# Patient Record
Sex: Male | Born: 1940 | Race: Black or African American | Hispanic: No | Marital: Married | Smoking: Former smoker
Health system: Southern US, Community
[De-identification: ages and names within clinical notes are randomized; demographics above are authoritative.]

## PROBLEM LIST (undated history)

## (undated) DIAGNOSIS — M109 Gout, unspecified: Secondary | ICD-10-CM

## (undated) DIAGNOSIS — I10 Essential (primary) hypertension: Secondary | ICD-10-CM

## (undated) DIAGNOSIS — I482 Chronic atrial fibrillation, unspecified: Secondary | ICD-10-CM

## (undated) DIAGNOSIS — I5032 Chronic diastolic (congestive) heart failure: Secondary | ICD-10-CM

---

## 2004-10-25 ENCOUNTER — Emergency Department: Payer: Self-pay | Admitting: General Practice

## 2006-01-16 ENCOUNTER — Inpatient Hospital Stay: Payer: Self-pay | Admitting: Internal Medicine

## 2006-01-16 ENCOUNTER — Other Ambulatory Visit: Payer: Self-pay

## 2006-01-16 IMAGING — CR DG CHEST 2V
1 series · 2 of 2 positions shown · non-contrast
Comparison: none

REASON FOR EXAM: Weakness on right side
COMMENTS:

[Series 1: view not recorded · 0.17mm/px · 2 of 2 slices shown]
[im 1/2]
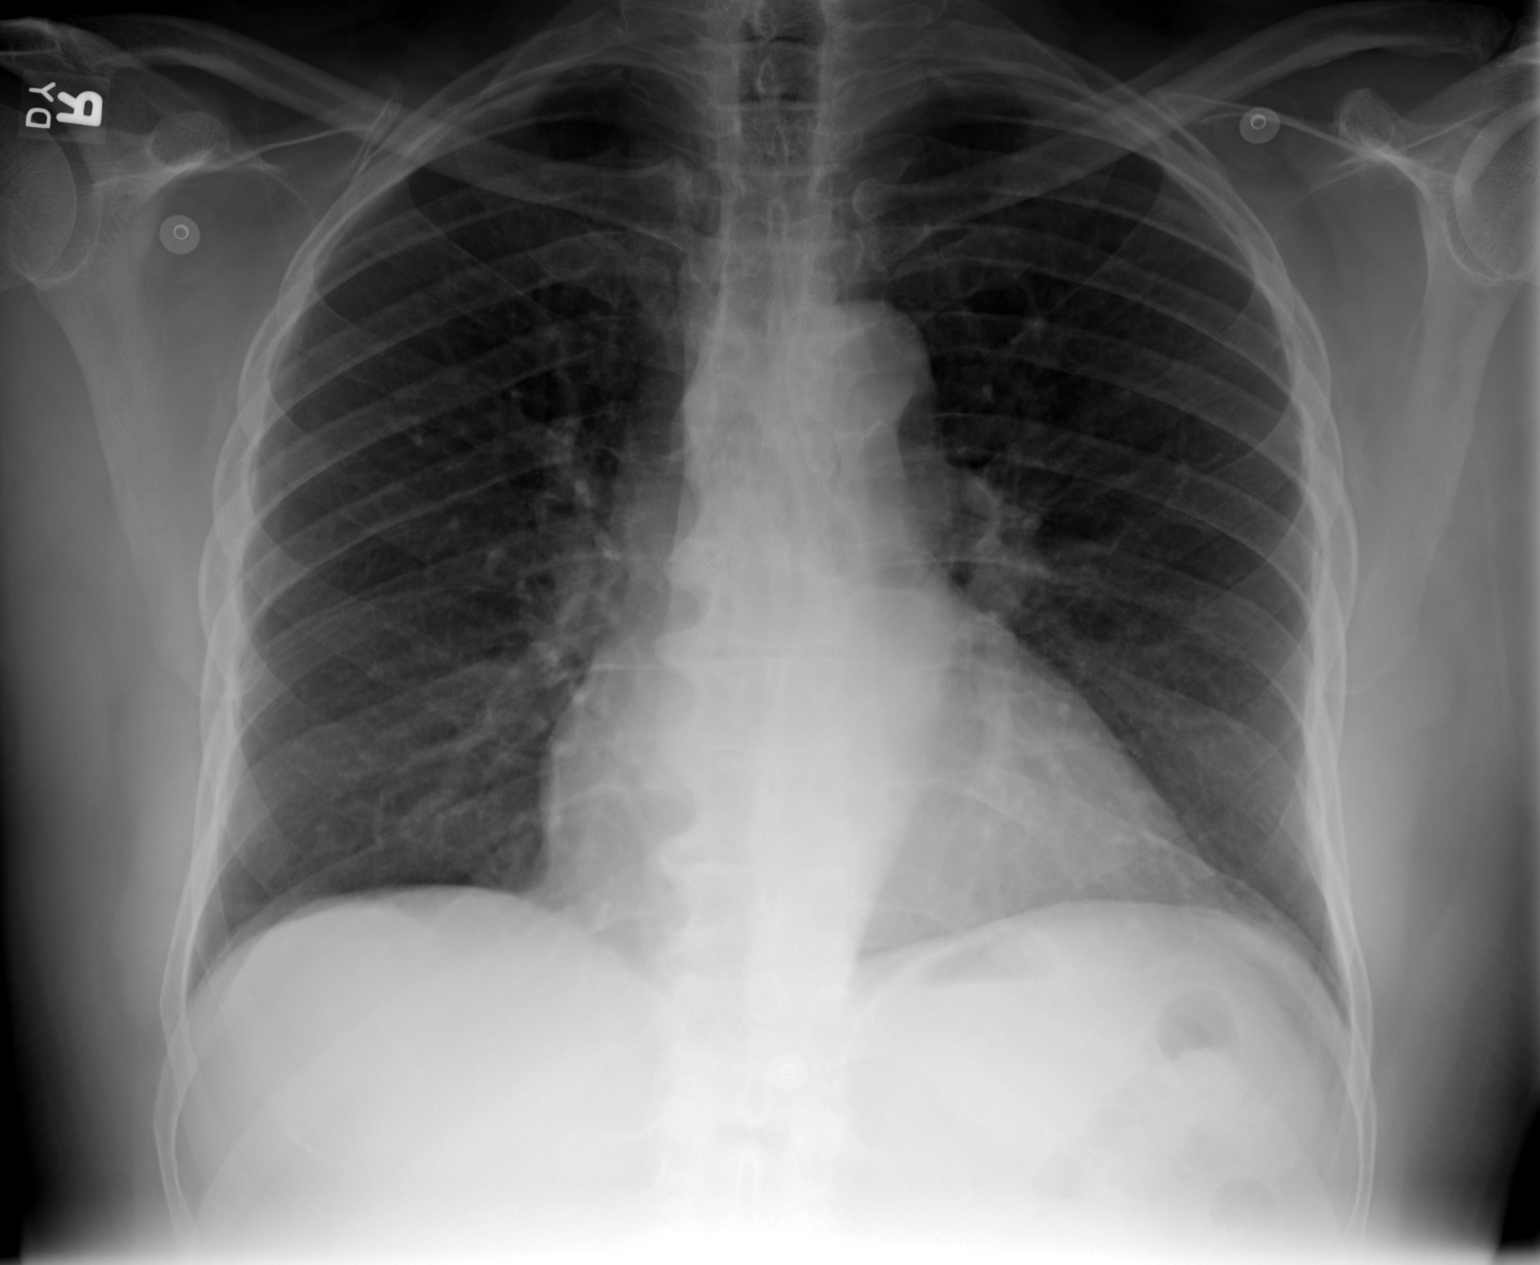
[im 2/2]
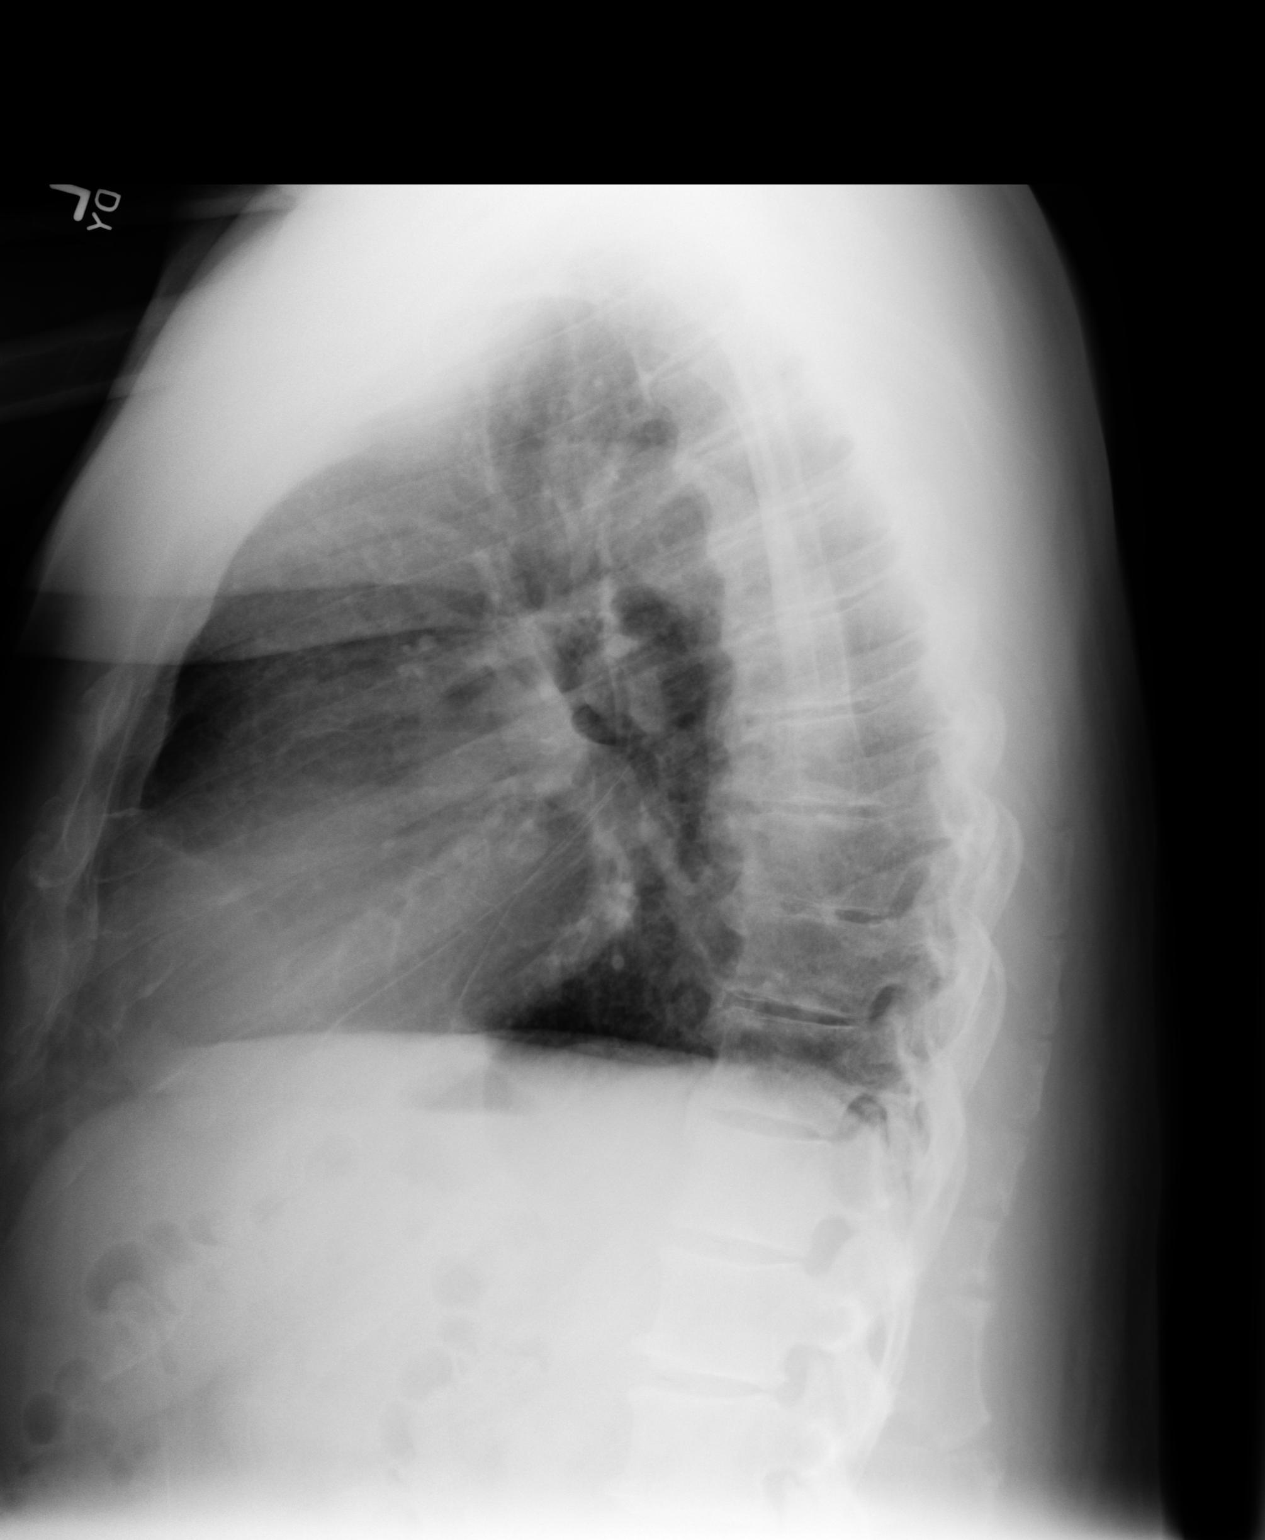

[2 of 2 positions shown; findings below may reference images not displayed]

PROCEDURE:     DXR - DXR CHEST PA (OR AP) AND LATERAL  - [DATE]  [DATE]

RESULT:          The cardiac silhouette is moderately enlarged.  There is no
evidence of focal infiltrates.  There does appear to be an element of mild
flattening of the hemidiaphragms.  The visualized bony skeleton is
unremarkable.
IMPRESSION: 1.     Findings which appear to be consistent with an element of COPD.
2.     Cardiomegaly.
3.     No evidence of focal or acute cardiopulmonary disease.

## 2006-01-16 IMAGING — CT CT HEAD WITHOUT CONTRAST
2 series · 16 of 30 positions shown, 20 images · non-contrast
Comparison: none

REASON FOR EXAM: Weakness on right side
COMMENTS:

[Series 2: without · axial · non-contrast · 0.43mm/px · z∈[-192,-68]mm · 13 of 31 slices shown, 17 images]
[im 3/31  brain]
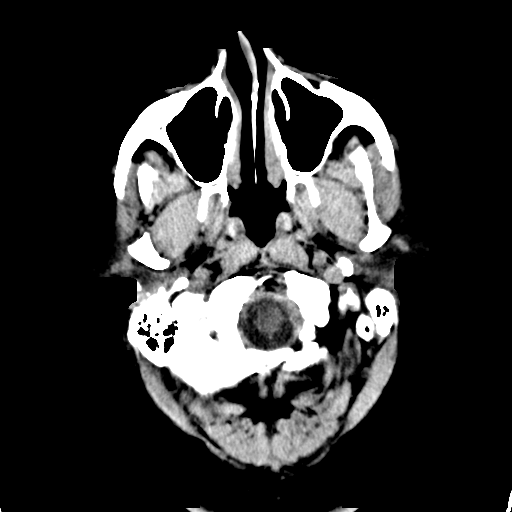
[im 3/31  bone]
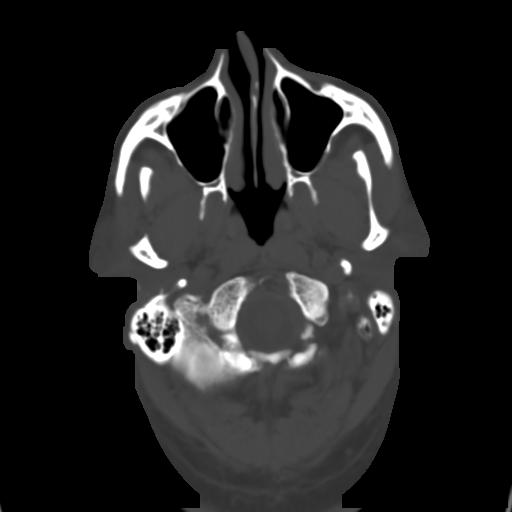
[im 5/31  brain]
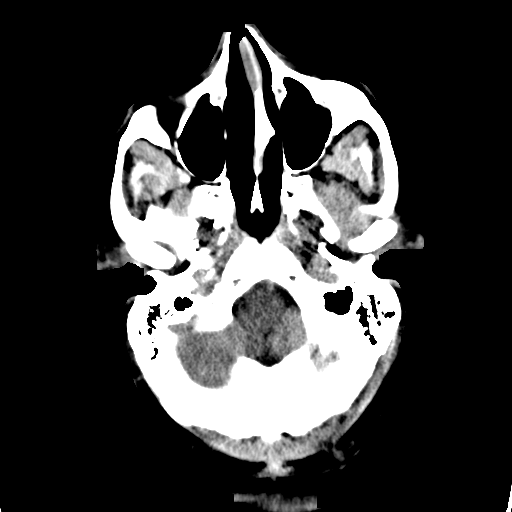
[im 7/31  brain]
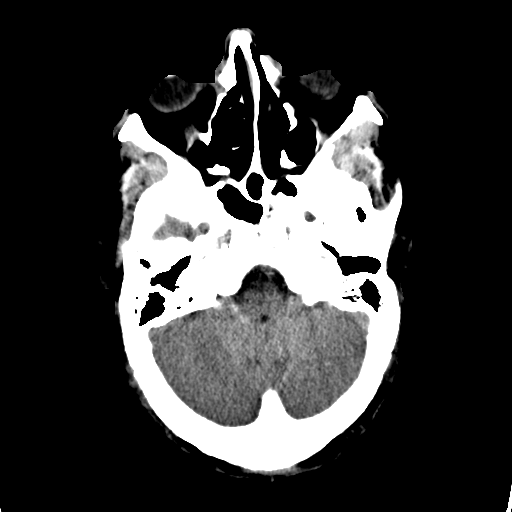
[im 9/31  brain]
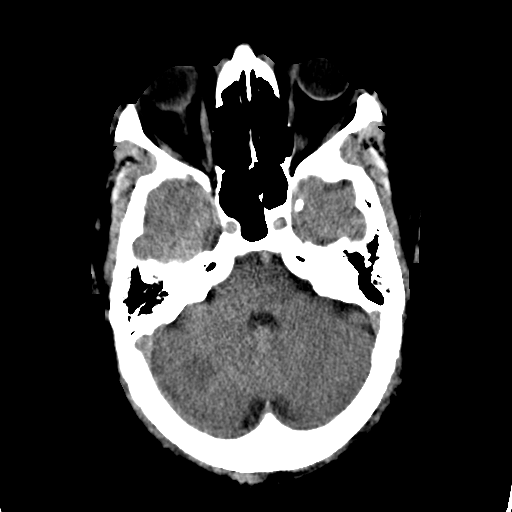
[im 11/31  brain]
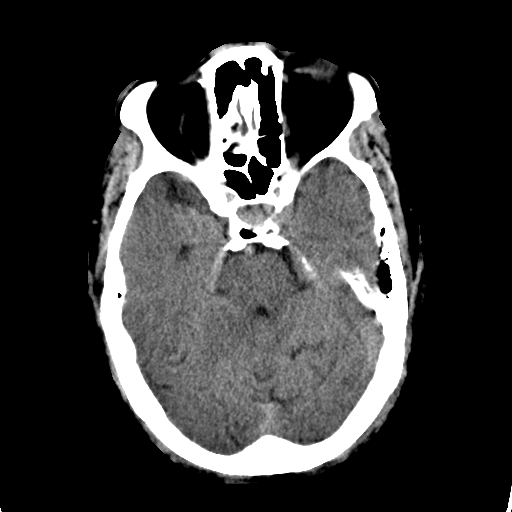
[im 11/31  bone]
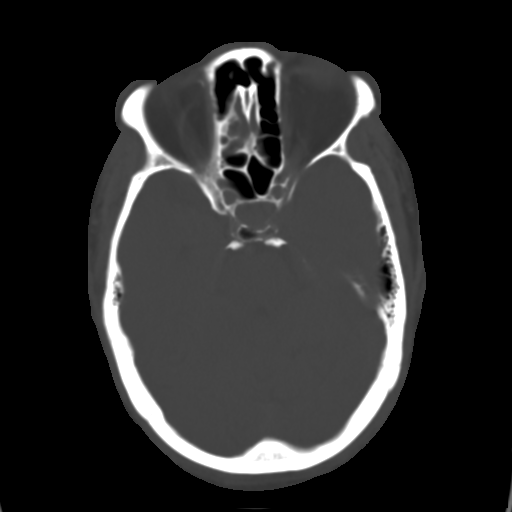
[im 13/31  brain]
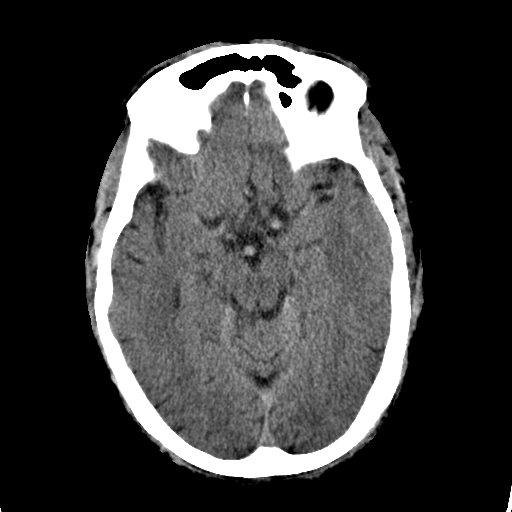
[im 16/31  brain]
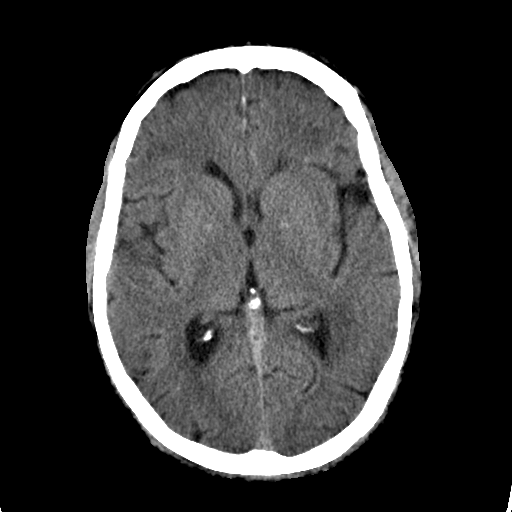
[im 18/31  brain]
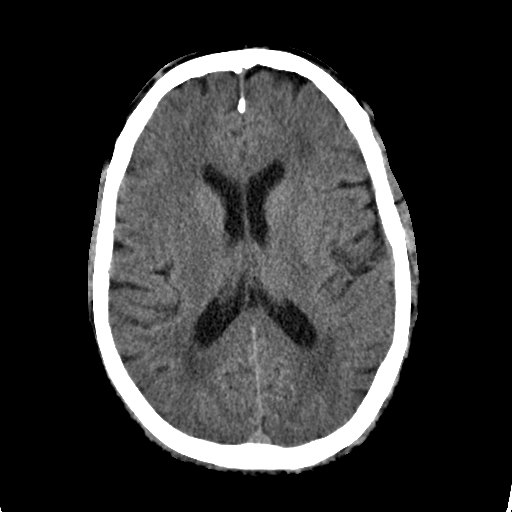
[im 20/31  brain]
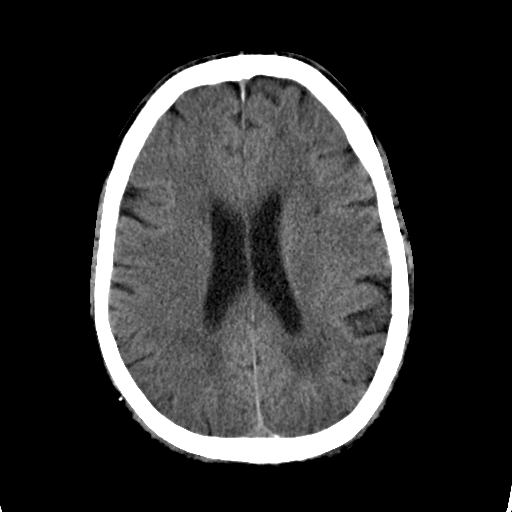
[im 20/31  bone]
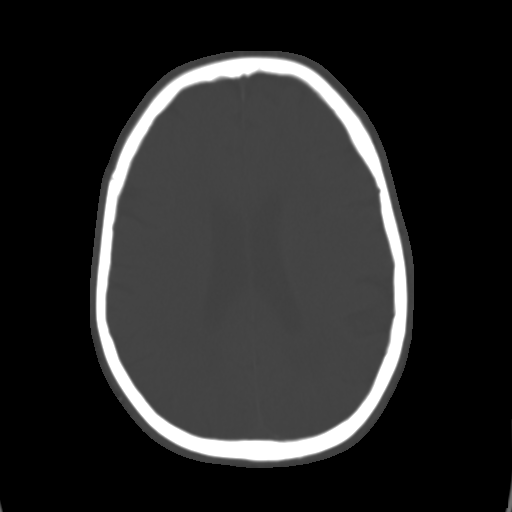
[im 22/31  brain]
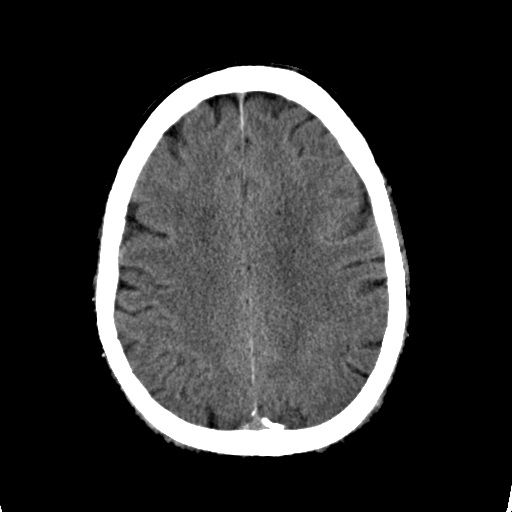
[im 24/31  brain]
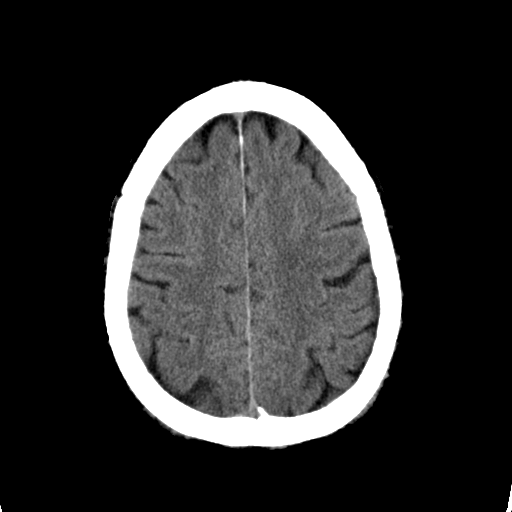
[im 26/31  brain]
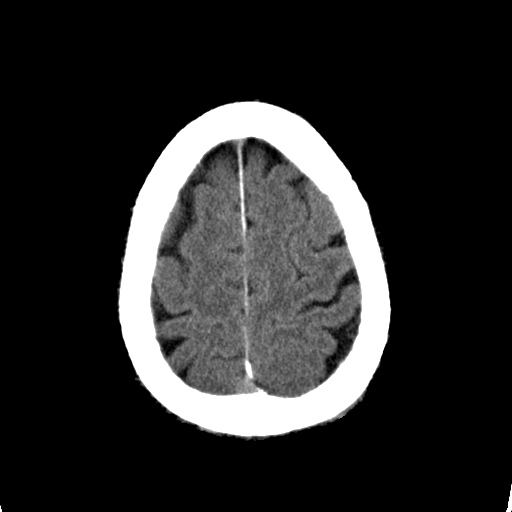
[im 28/31  brain]
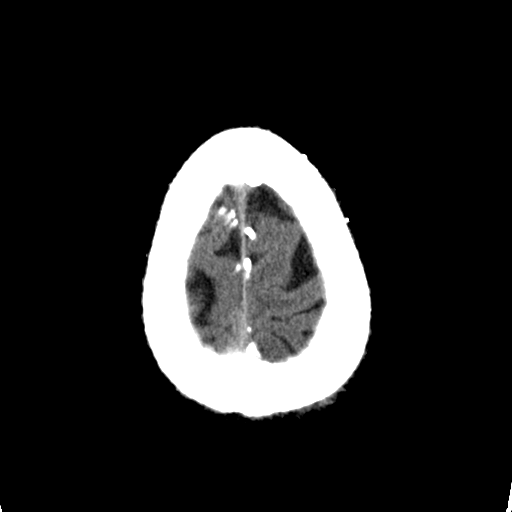
[im 28/31  bone]
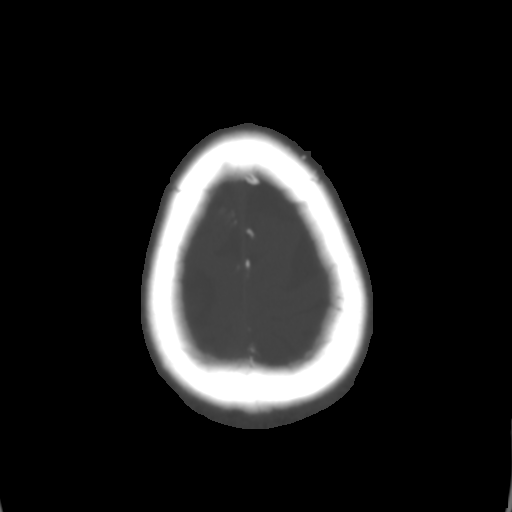

[Series 3: bone · axial · 0.43mm/px · z∈[-192,-152]mm · 3 of 31 slices shown]
[im 3/31  bone]
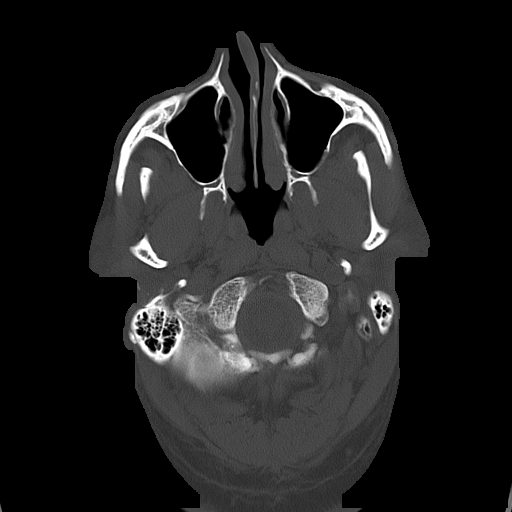
[im 7/31  bone]
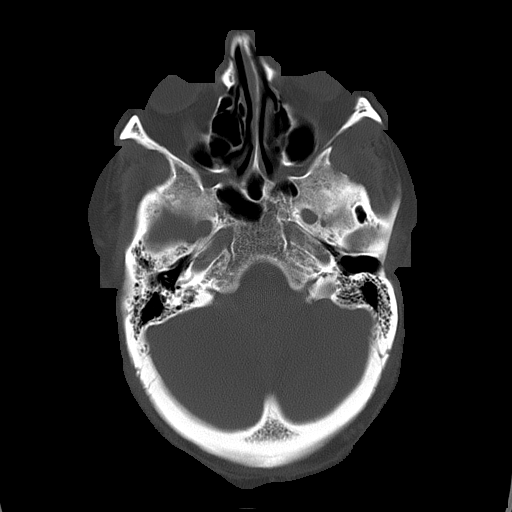
[im 11/31  bone]
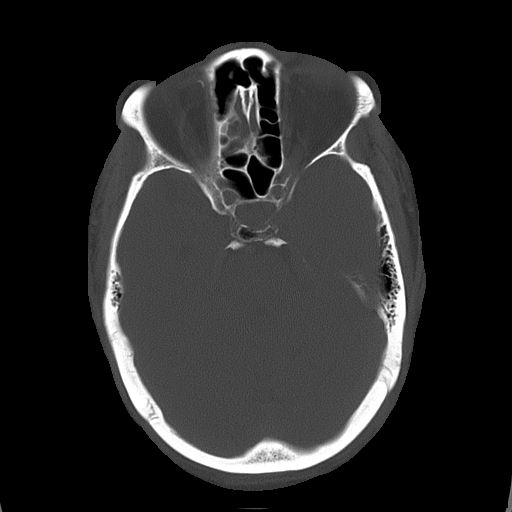

[16 of 30 positions shown; findings below may reference images not displayed]

PROCEDURE:     CT  - CT HEAD WITHOUT CONTRAST  - [DATE]  [DATE]

RESULT:          There is no evidence of intraaxial nor extraaxial fluid
collections nor evidence of acute hemorrhage.  No secondary signs are
appreciated to suggest mass effect nor focal subacute or chronic infarction.
 Age-related changes are evident demonstrated by diffuse cerebral atrophy
and periventricular small vessel white matter ischemia.  The visualized bony
skeleton demonstrates no evidence of fracture or dislocation.
IMPRESSION: Age-related and chronic changes without evidence of
acute abnormalities nor focal abnormalities.

## 2006-01-17 ENCOUNTER — Other Ambulatory Visit: Payer: Self-pay

## 2006-01-18 IMAGING — US US CAROTID DUPLEX BILAT
1 series · 17 of 24 positions shown · non-contrast
Comparison: none

REASON FOR EXAM: TIA
COMMENTS:

[Series 1: us carotid duplex bilat · 17 of 53 slices shown]
[im 1/53]
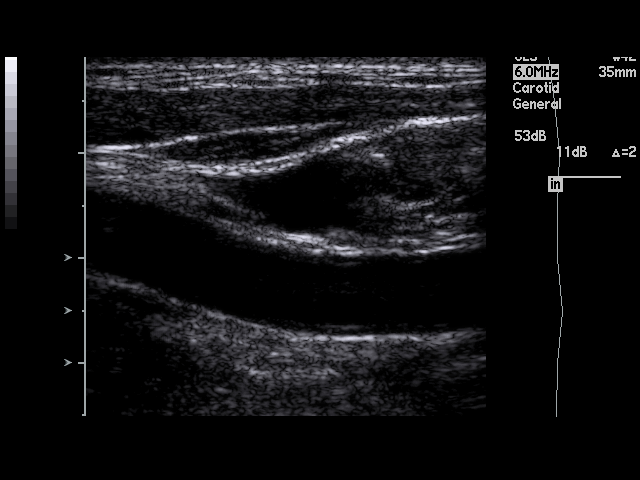
[im 5/53]
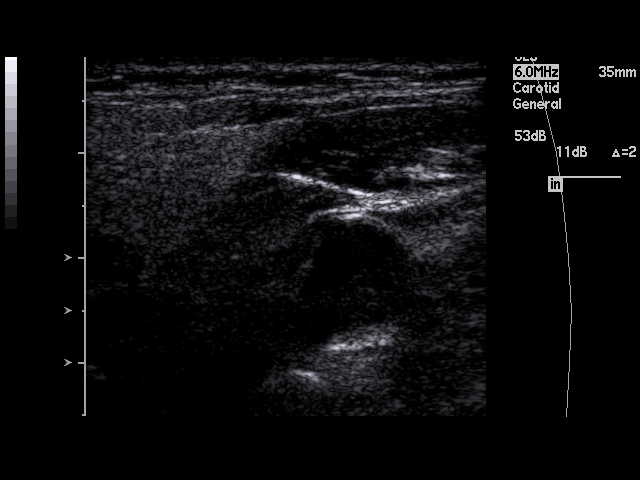
[im 7/53]
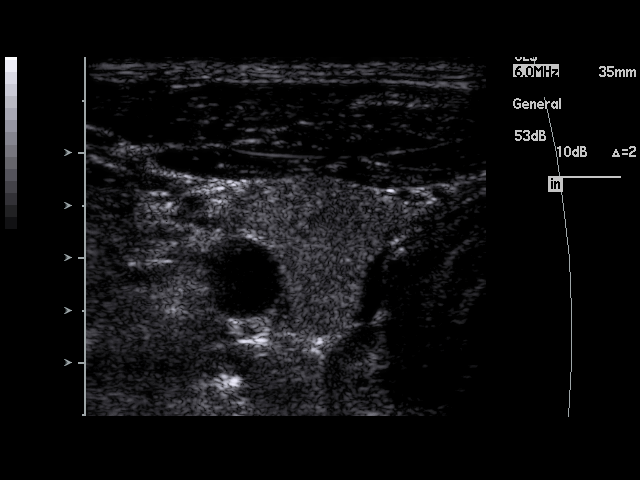
[im 10/53]
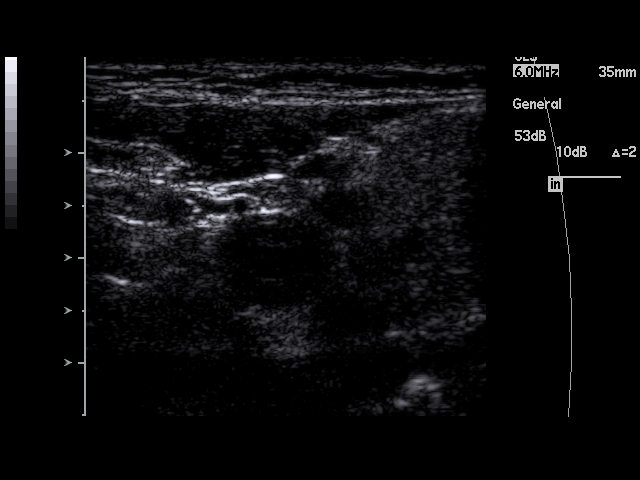
[im 14/53]
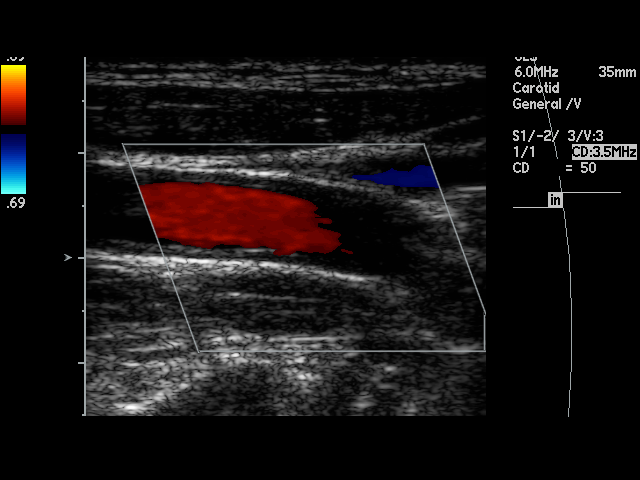
[im 16/53]
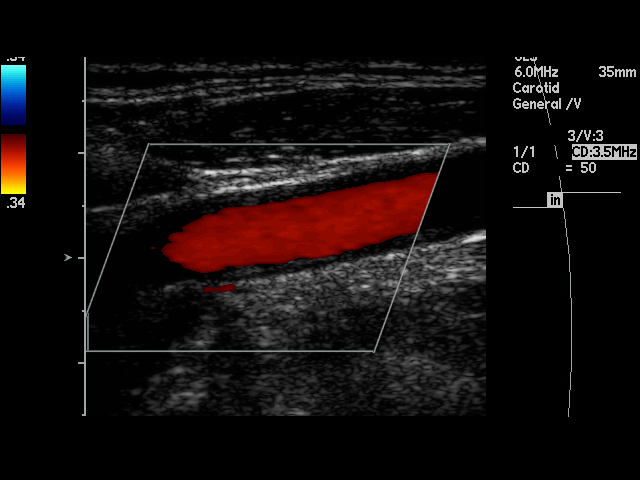
[im 21/53]
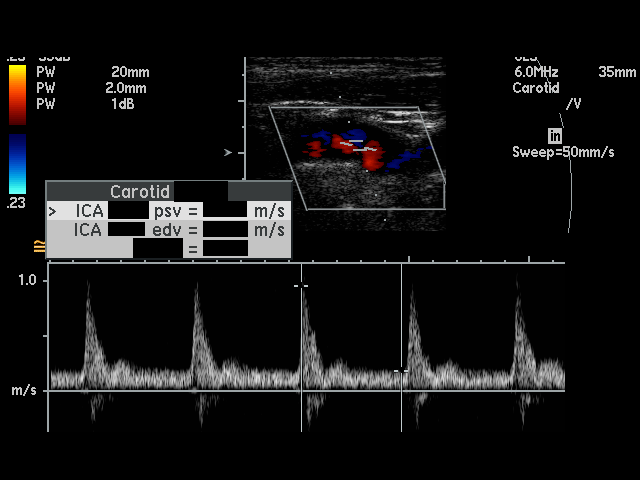
[im 23/53]
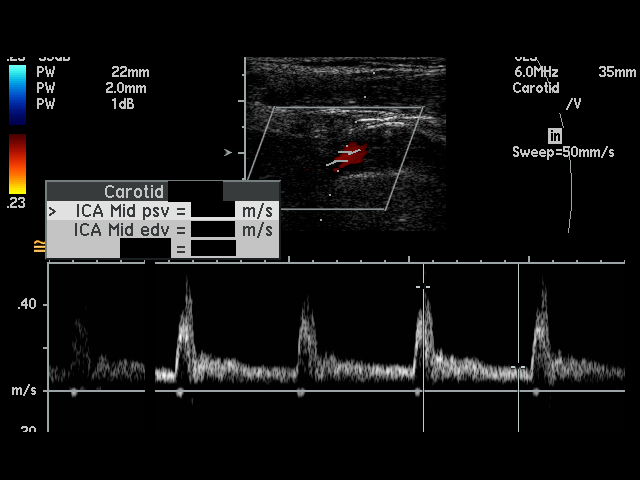
[im 28/53]
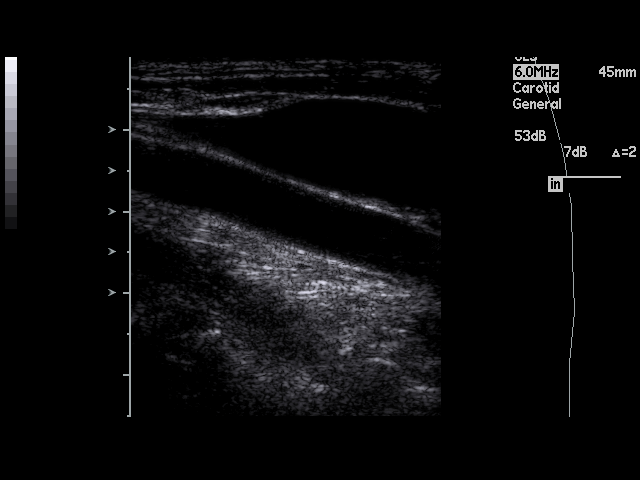
[im 30/53]
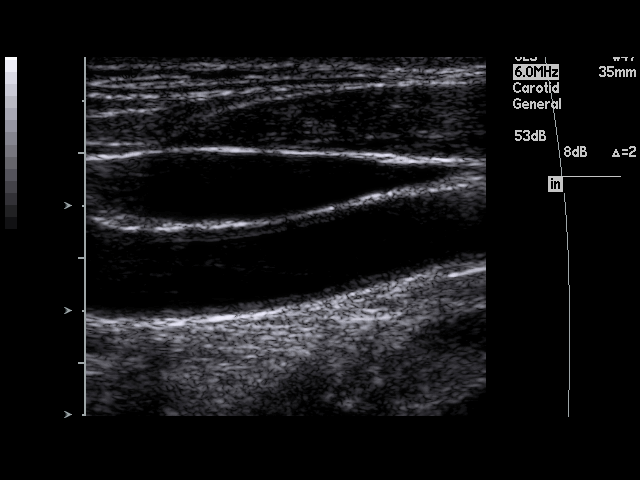
[im 32/53]
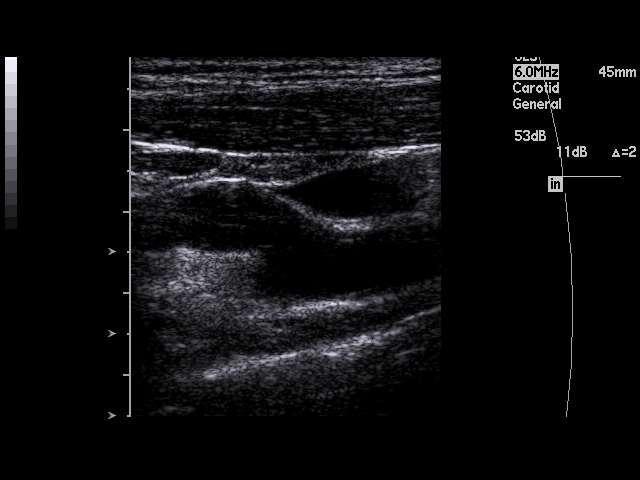
[im 37/53]
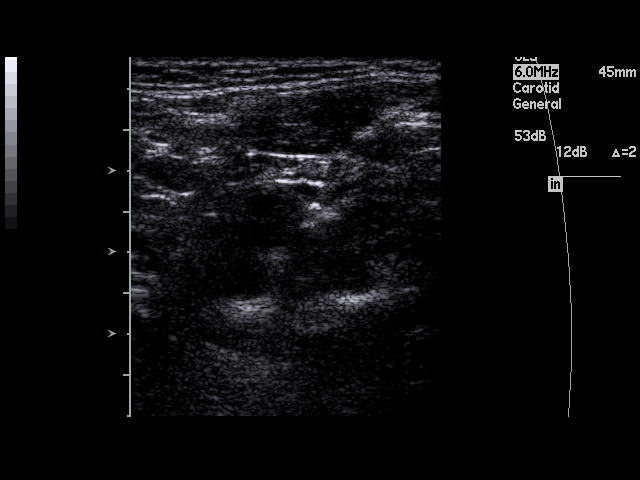
[im 39/53]
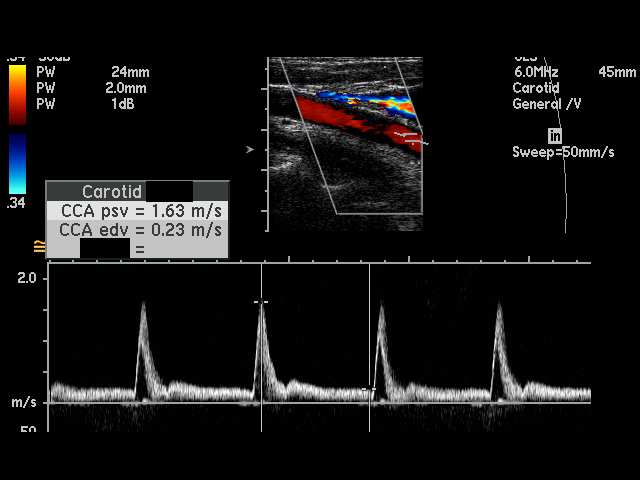
[im 43/53]
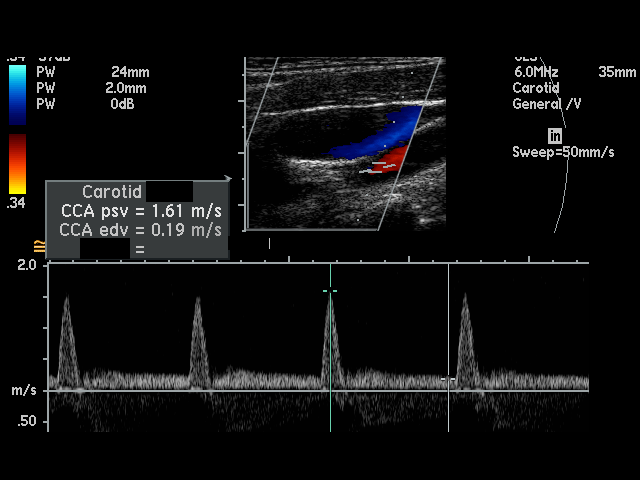
[im 46/53]
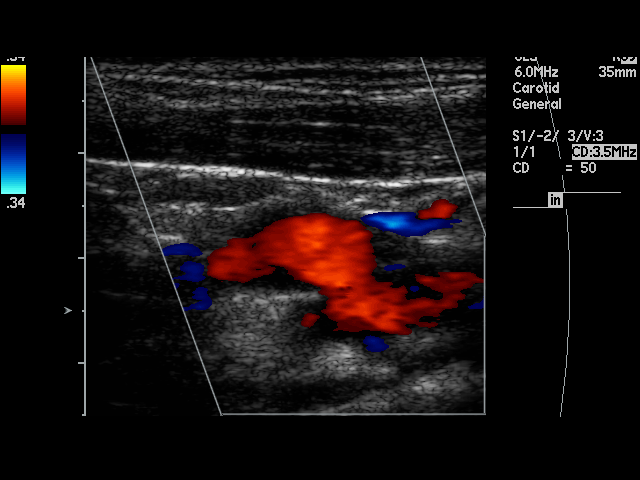
[im 48/53]
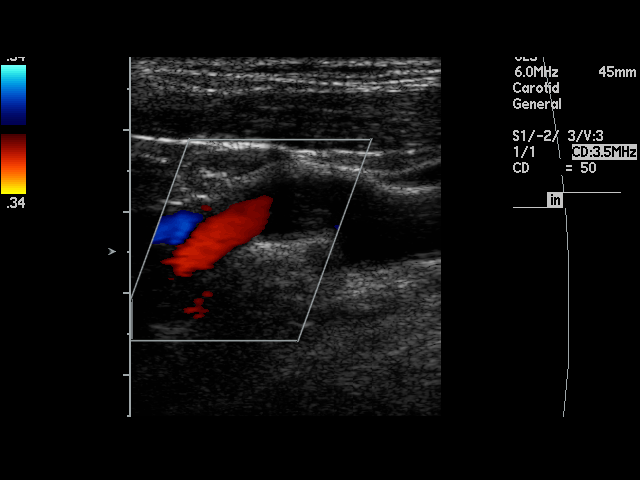
[im 53/53]
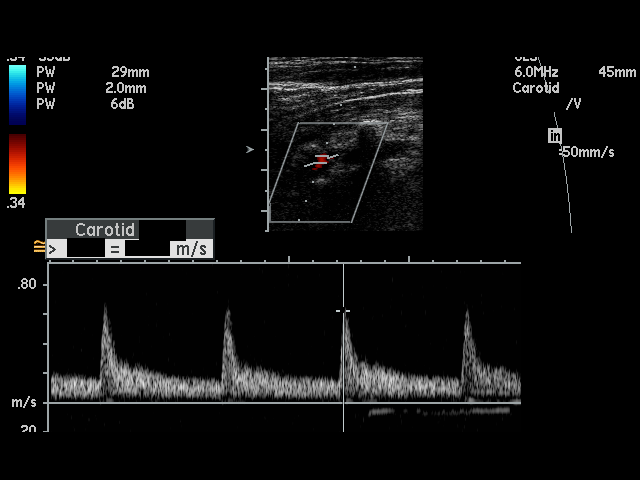

[17 of 24 positions shown; findings below may reference images not displayed]

PROCEDURE:     US  - US CAROTID DOPPLER BILATERAL  - [DATE] [DATE]

RESULT:       No definite plaque formation is seen on either side.  On the
RIGHT, the peak RIGHT common carotid artery flow velocity measures
meters per second and the peak RIGHT internal carotid artery flow velocity
measures 0.960 meters per second.  IC/CC ratio is 0.596.

On the LEFT, the peak LEFT common carotid artery flow velocity measures
1.630 meters per second and the peak LEFT internal carotid artery flow
velocity measures 1.035 meters per second. The IC/CC ratio is 0.635.  These
values bilaterally are compatible with the absence of hemodynamically
significant stenosis.

Antegrade flow is present in both vertebrals.
IMPRESSION: 1.     Normal study.  No plaque formation or stenosis is seen on either side.
2.     Antegrade flow is present in both vertebrals.

## 2006-06-28 ENCOUNTER — Emergency Department: Payer: Self-pay

## 2007-10-03 ENCOUNTER — Emergency Department: Payer: Self-pay | Admitting: Emergency Medicine

## 2007-10-03 IMAGING — CR DG KNEE 1-2V*L*
1 series · 3 of 3 positions shown · non-contrast
Comparison: none

REASON FOR EXAM: pain and swelling
COMMENTS:

[Series 1: view not recorded · 0.17mm/px · 3 of 3 slices shown]
[im 1/3]
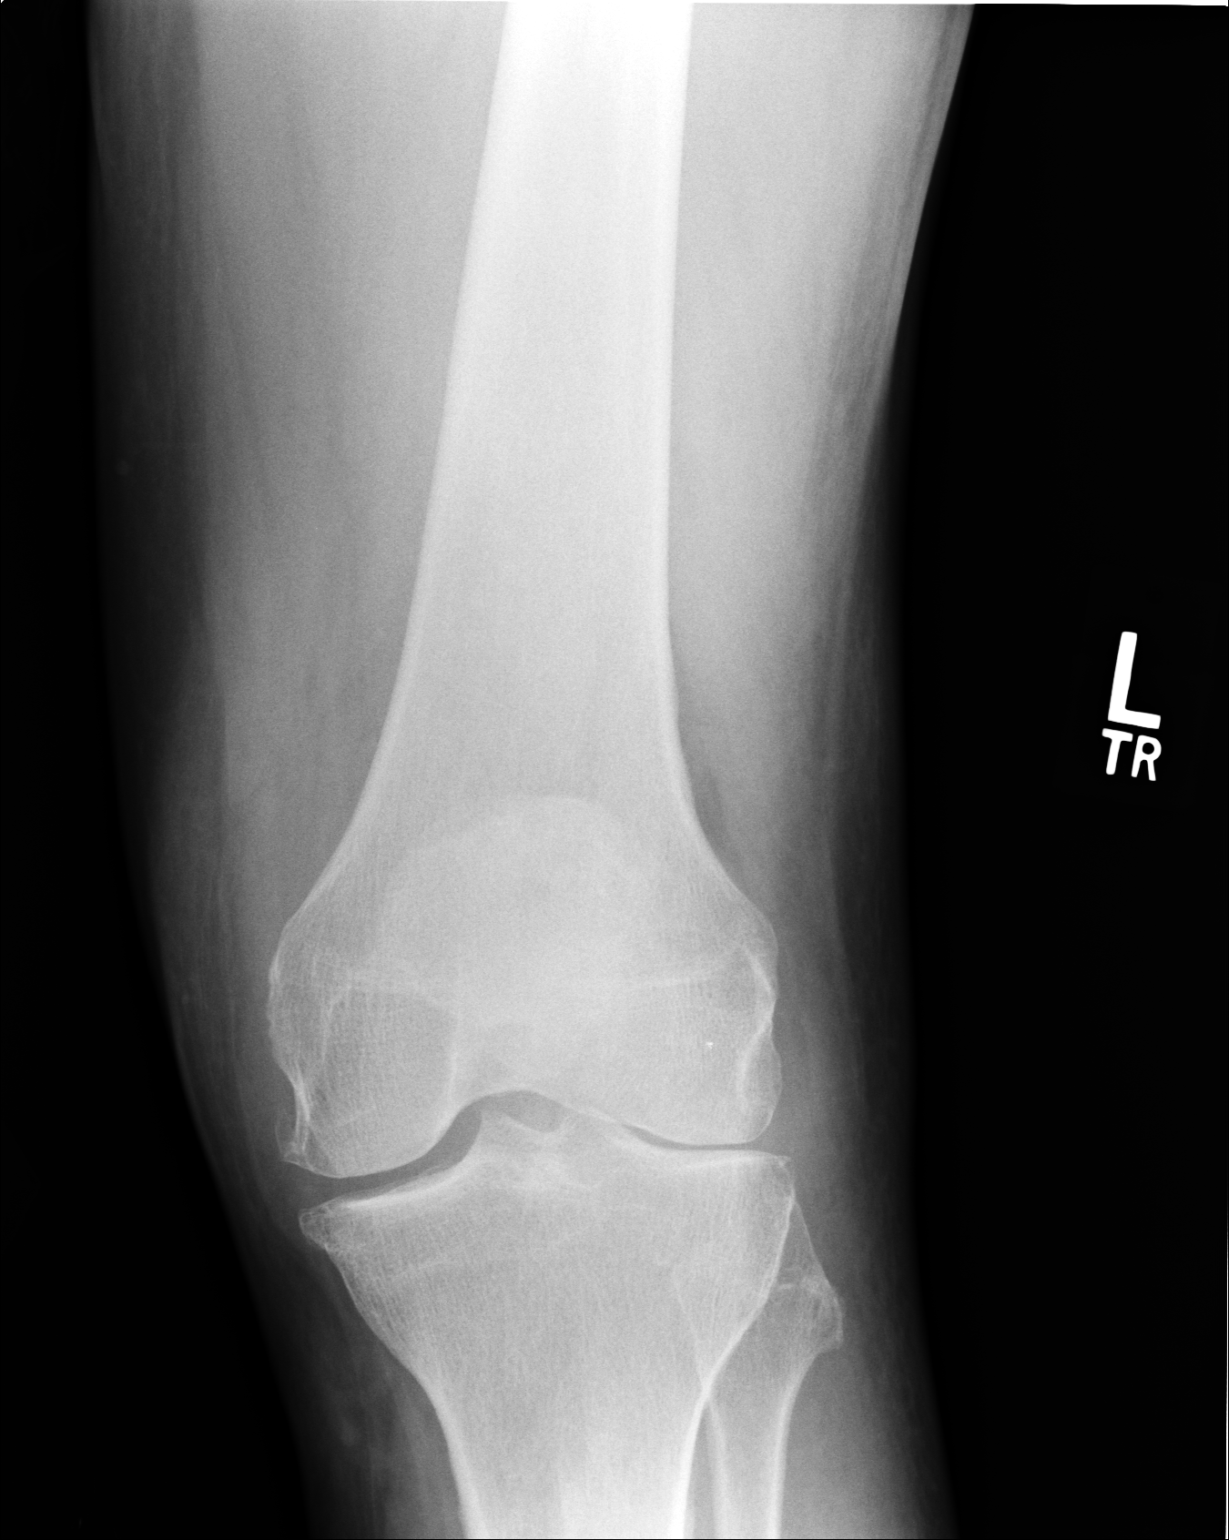
[im 2/3]
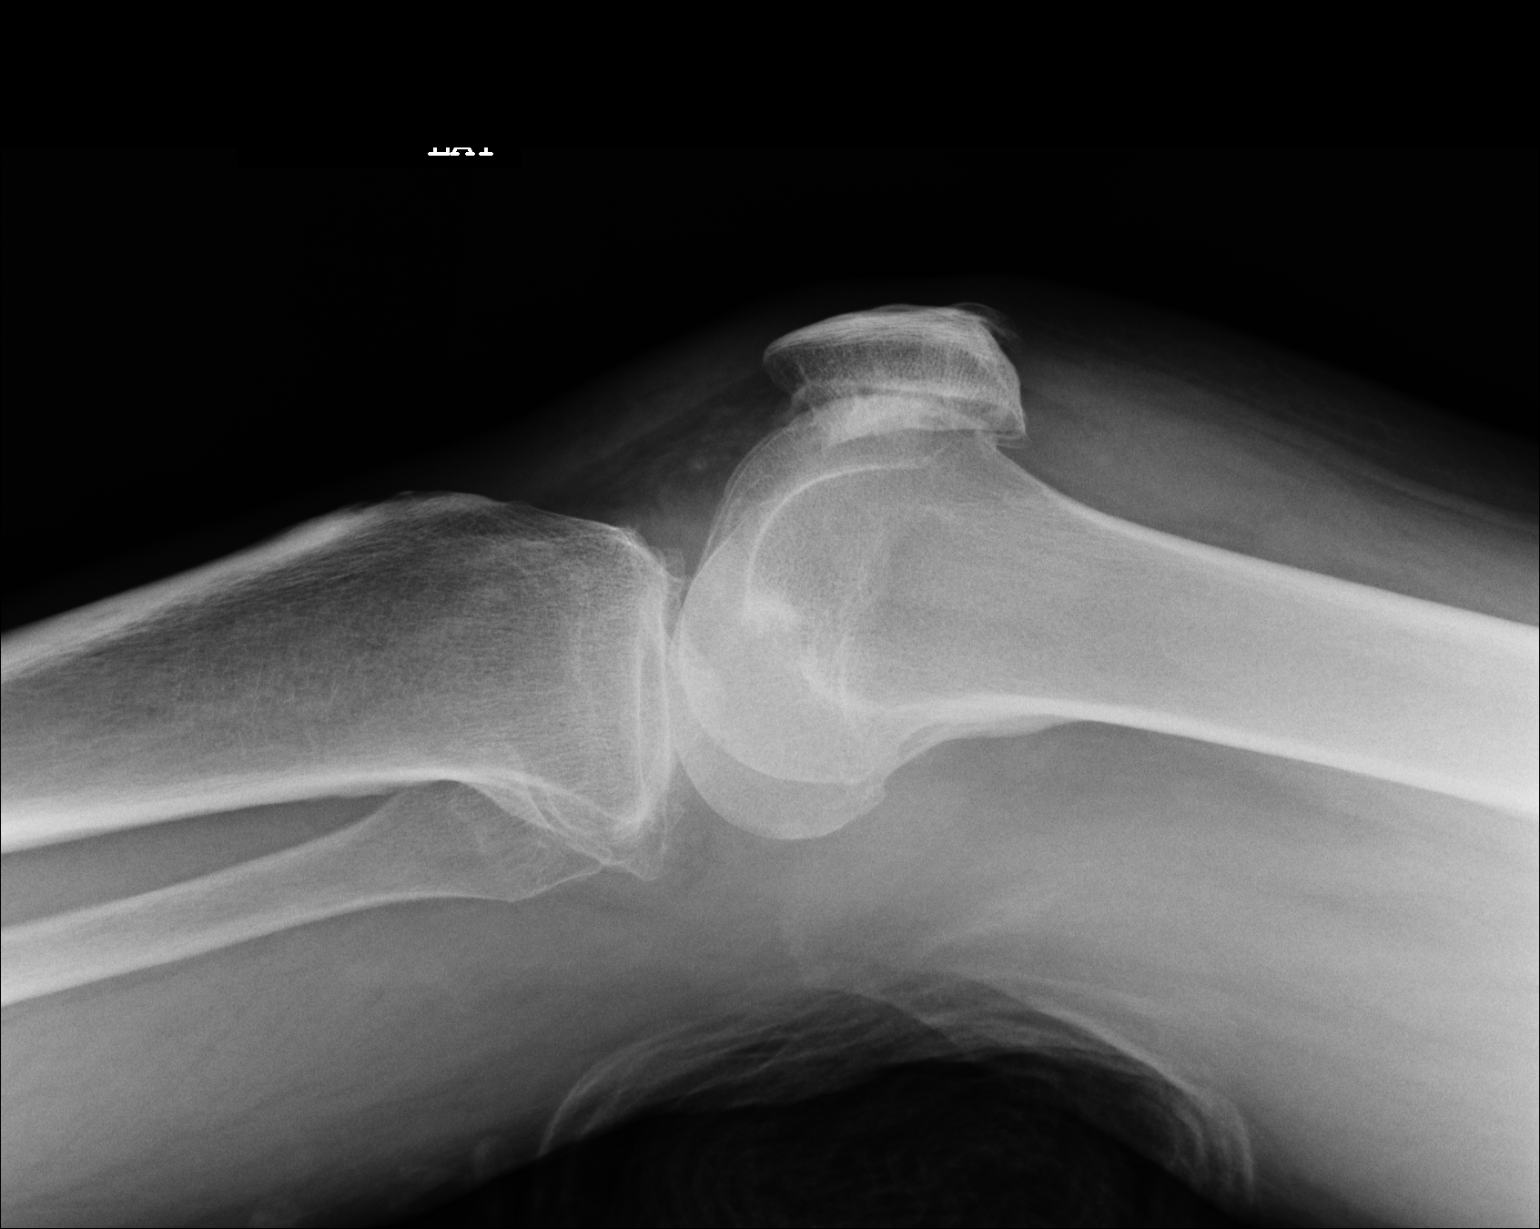
[im 3/3]
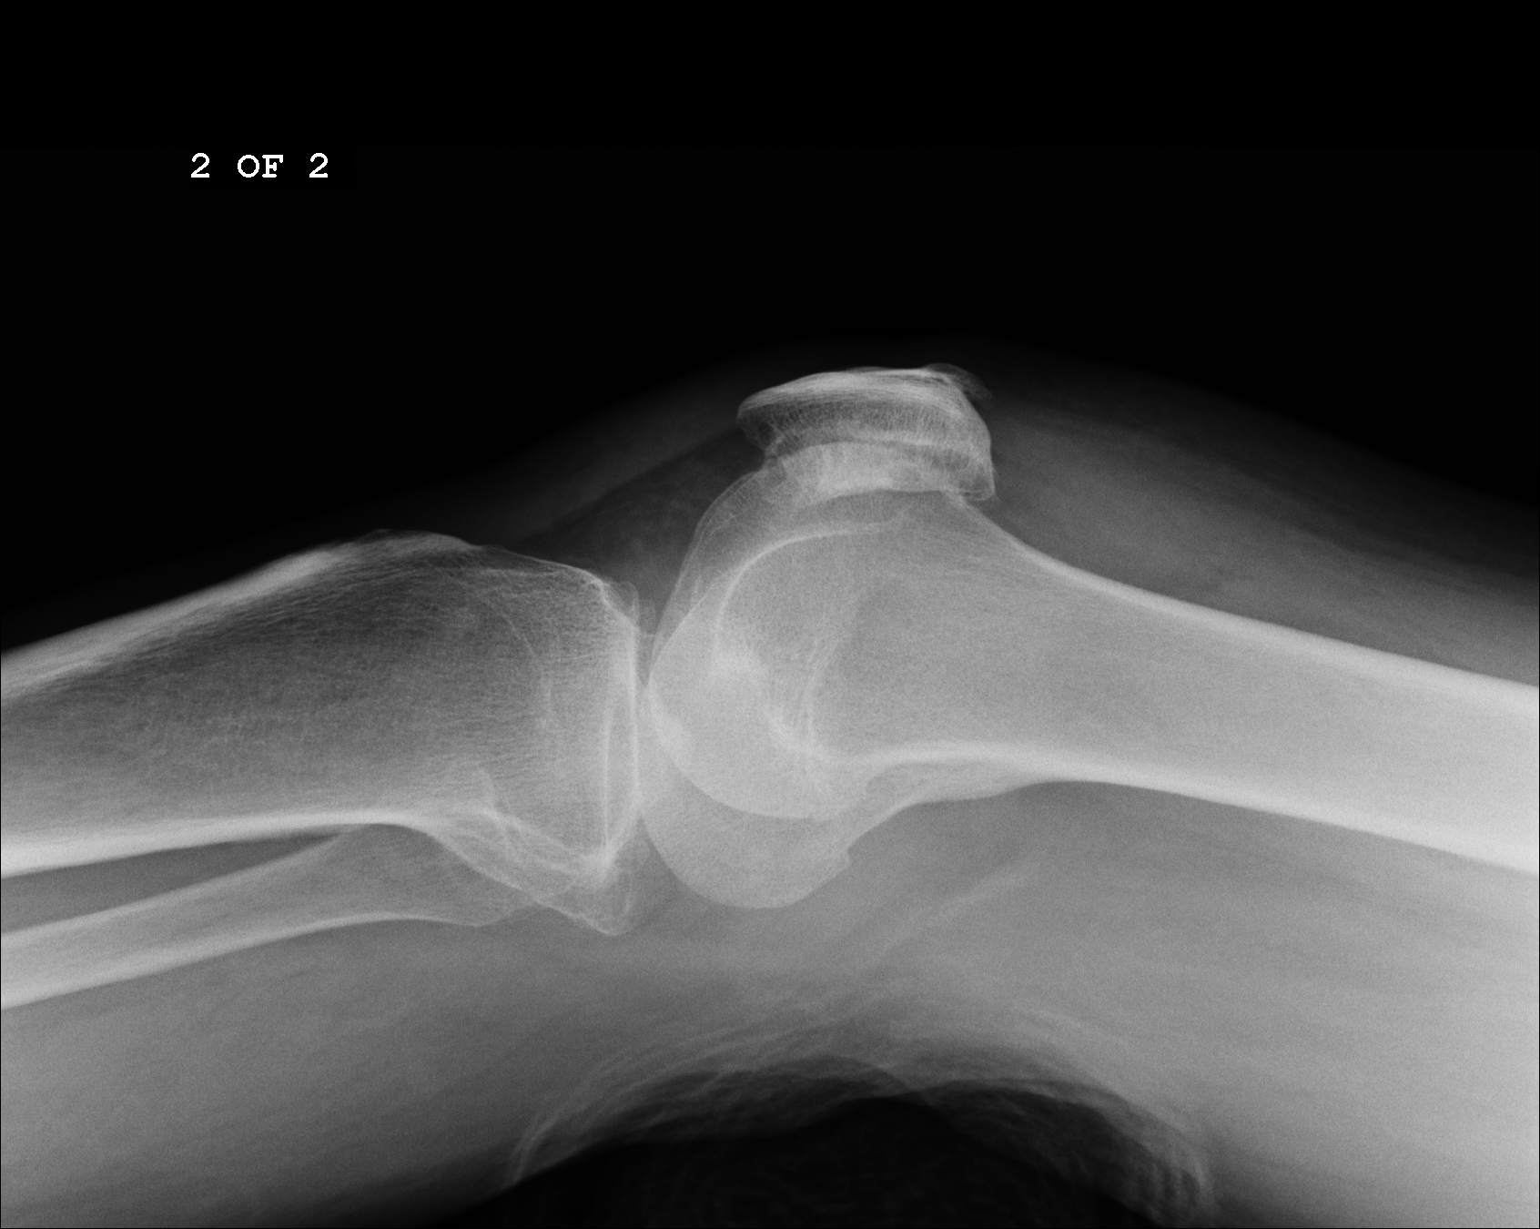

[3 of 3 positions shown; findings below may reference images not displayed]

PROCEDURE:     DXR - DXR KNEE LEFT AP AND LATERAL  - [DATE]  [DATE]

RESULT:     No fracture, dislocation or other acute bony abnormality is
identified. There is slight hypertrophic spurring about the knee medially
and laterally. The knee laterally appears mildly narrowed consistent with
arthritic change but with no associated sclerosis or cystic change in the
adjacent articular surfaces. In the lateral view there is narrowing of the
femoropatellar joint space consistent with arthritic change.
IMPRESSION: 1. No fracture is seen.
2. Arthritic changes are noted about the knee and femoropatellar joint as
noted above.

## 2008-02-22 ENCOUNTER — Inpatient Hospital Stay: Payer: Self-pay | Admitting: Internal Medicine

## 2008-02-22 ENCOUNTER — Other Ambulatory Visit: Payer: Self-pay

## 2008-02-22 IMAGING — CR DG CHEST 1V PORT
1 series · 1 of 1 positions shown · non-contrast
Comparison: none

REASON FOR EXAM: Shortness of breath
COMMENTS:

PROCEDURE:     DXR - DXR PORTABLE CHEST SINGLE VIEW  - [DATE]  [DATE]
RESULT:     There are infiltrates in the perihilar regions bilaterally. The
cardiac silhouette is mildly enlarged. I see no pleural effusion.

[view not recorded]
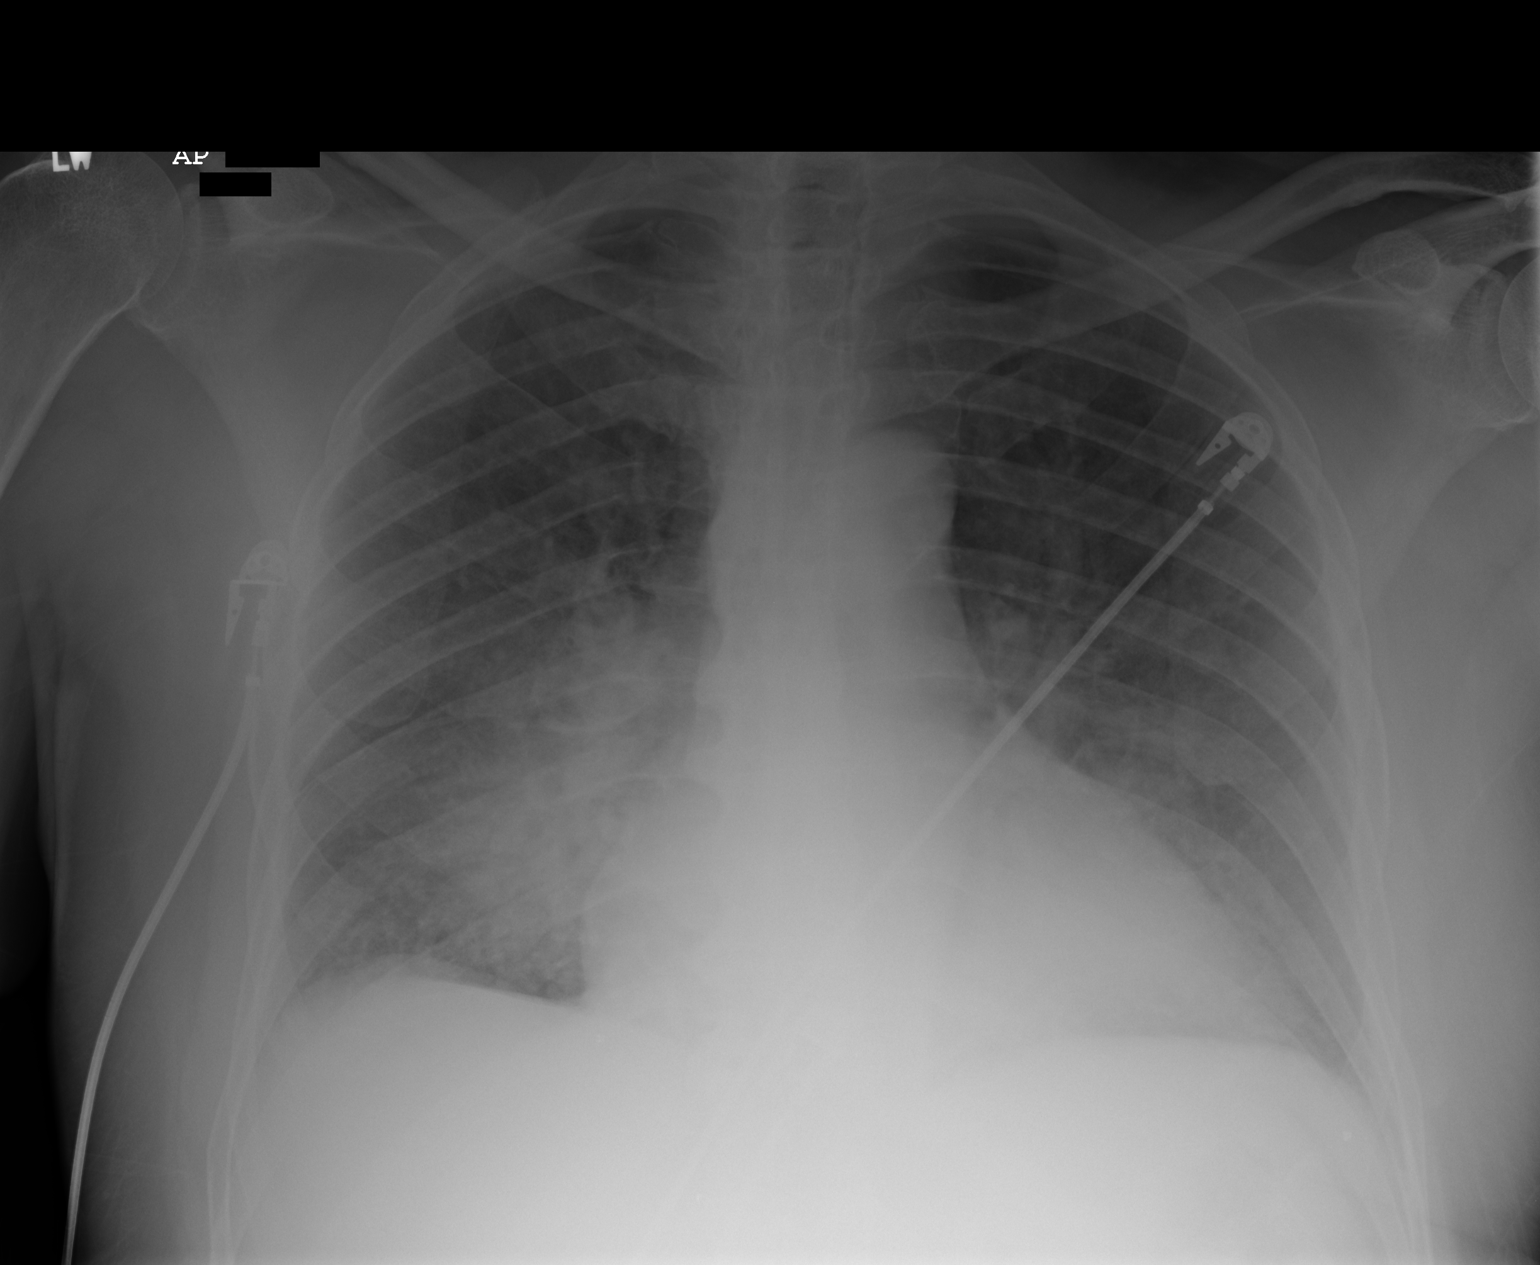

[1 of 1 positions shown; findings below may reference images not displayed]

IMPRESSION: The findings are worrisome for alveolar edema secondary to
CHF. Alternatively, pneumonia could be present though I favor CHF. A
follow-up PA and lateral chest x-ray would be of value.

## 2008-02-23 IMAGING — CR DG CHEST 2V
1 series · 2 of 2 positions shown · non-contrast
Comparison: none

REASON FOR EXAM: congestive heart failure
COMMENTS:

[Series 1: view not recorded · 0.17mm/px · 2 of 2 slices shown]
[im 1/2]
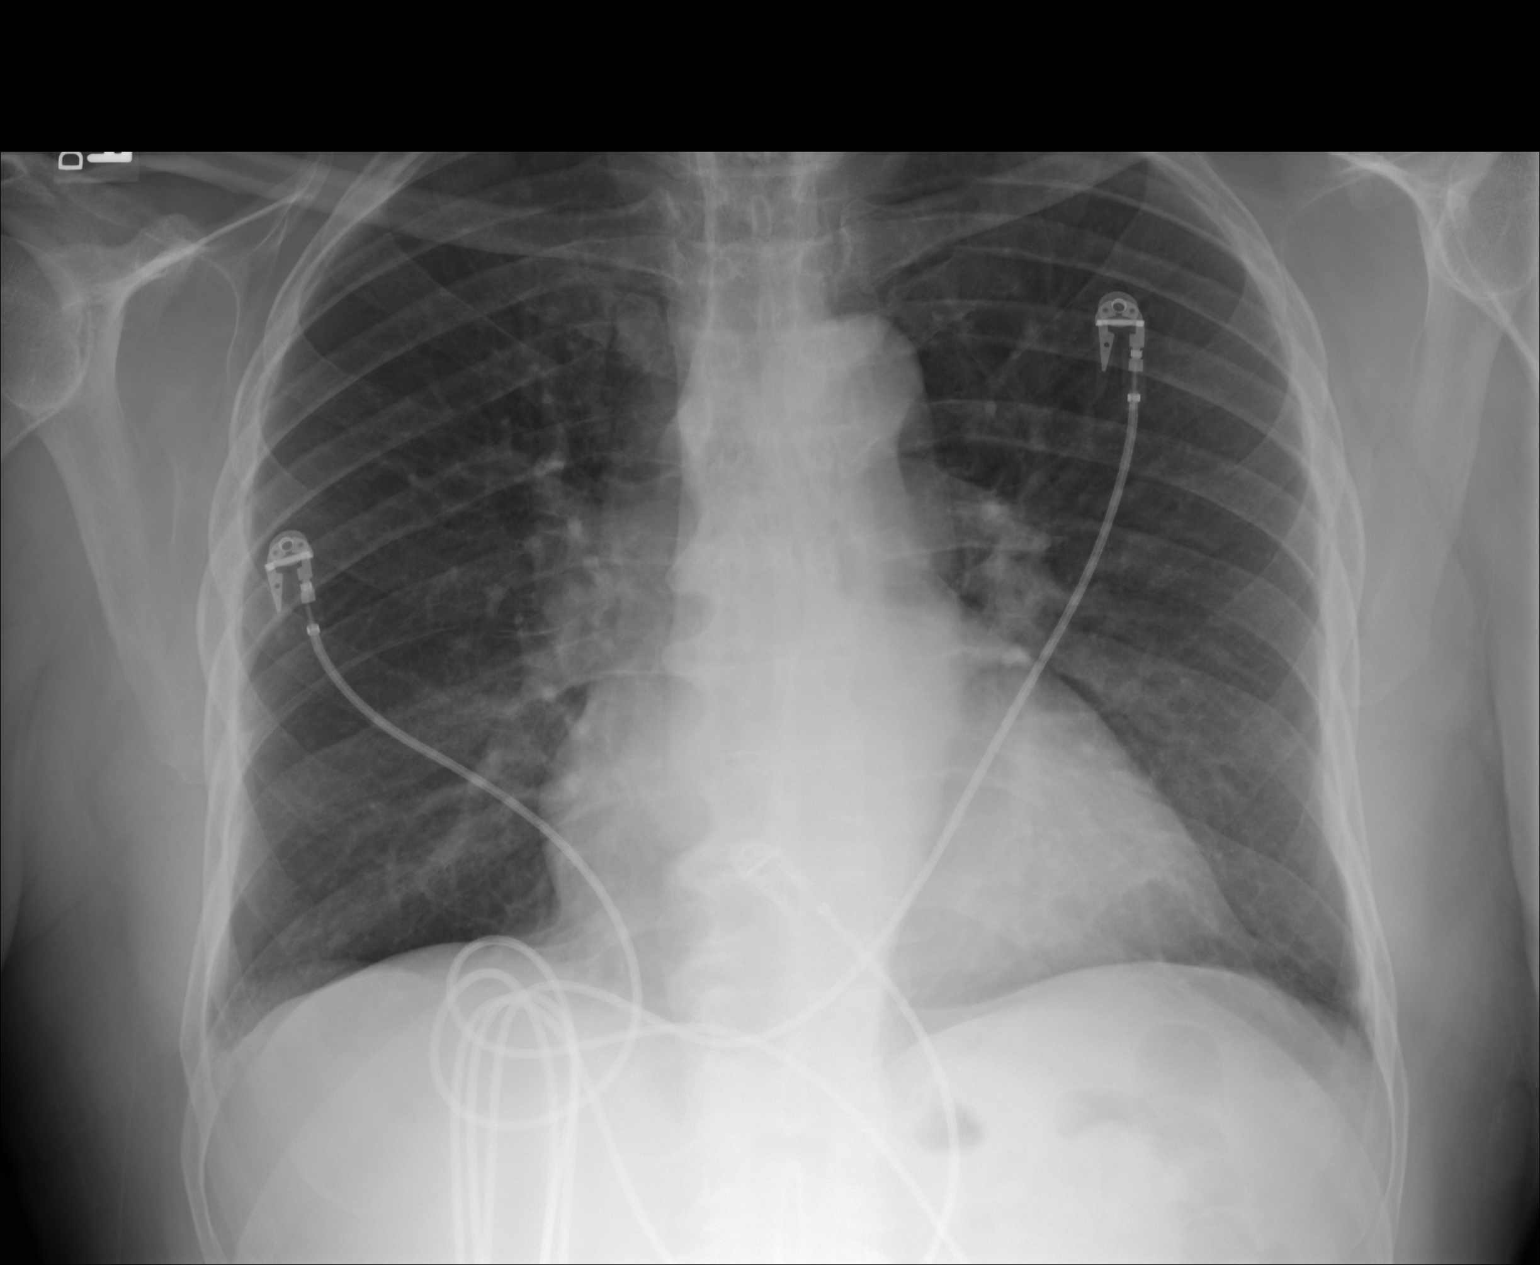
[im 2/2]
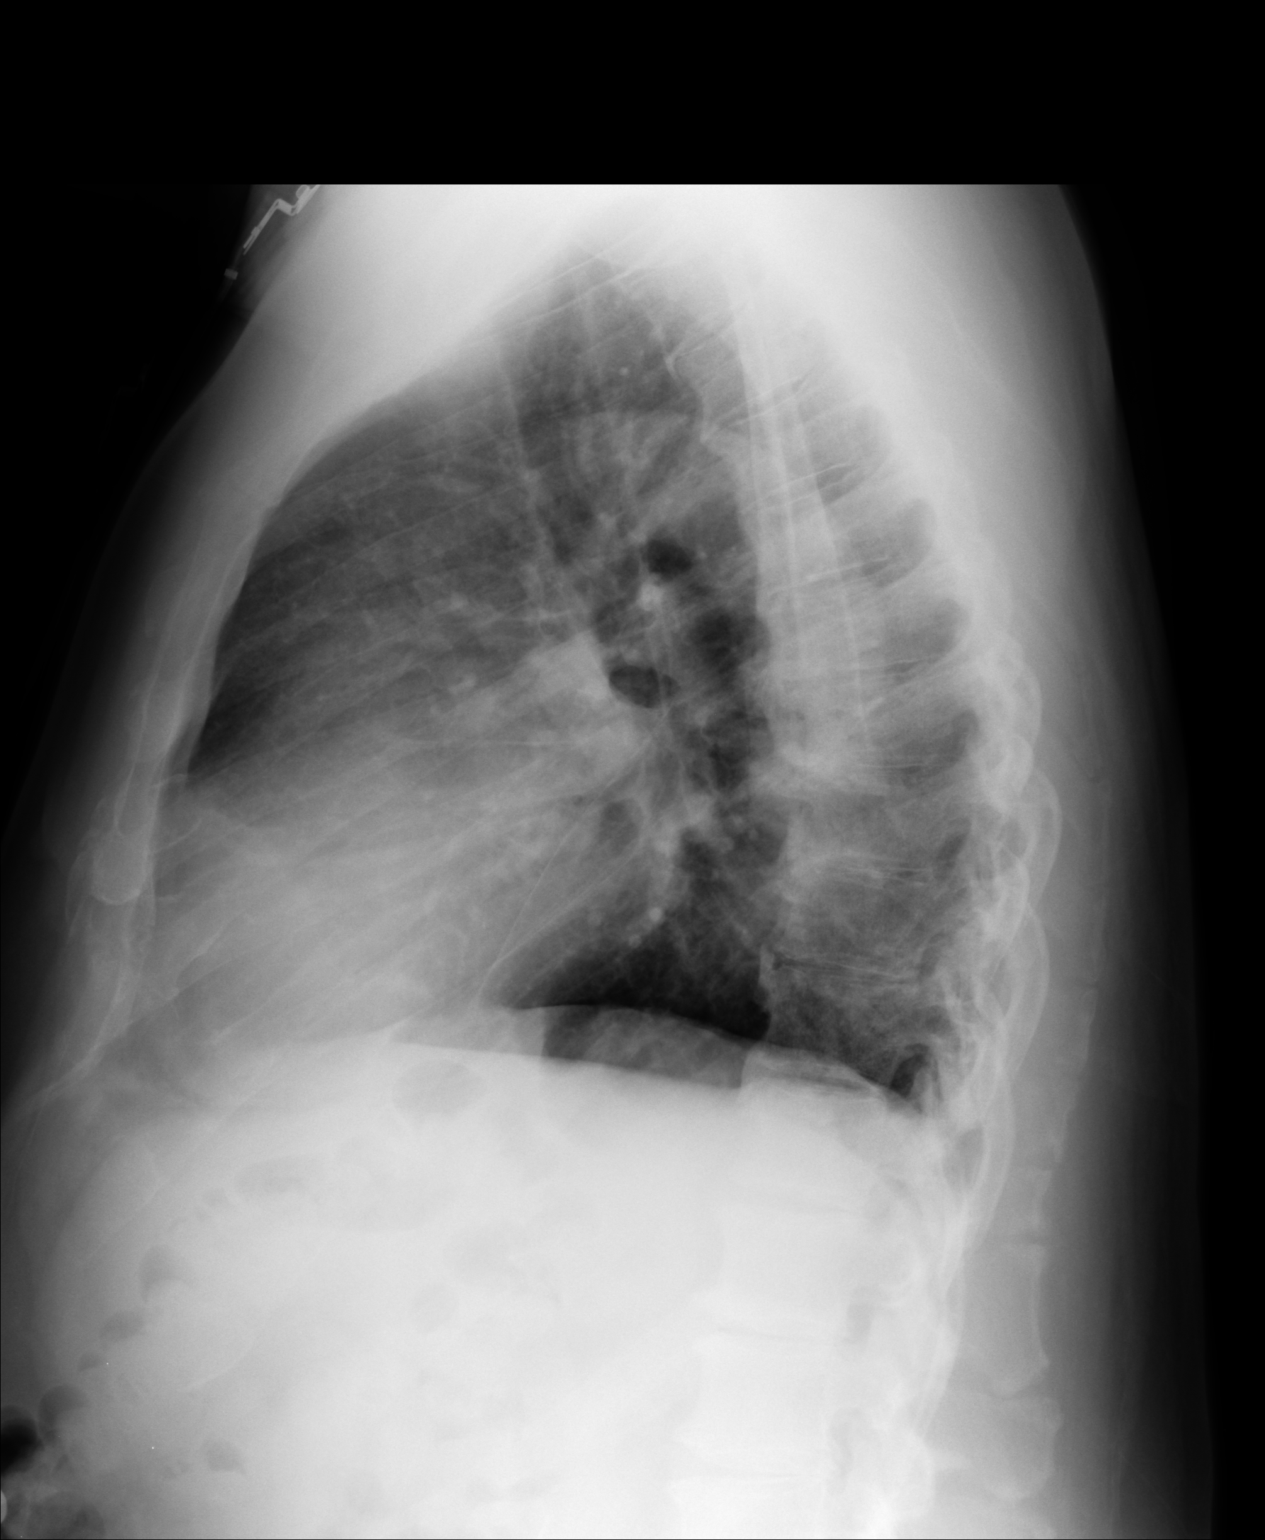

[2 of 2 positions shown; findings below may reference images not displayed]

PROCEDURE:     DXR - DXR CHEST PA (OR AP) AND LATERAL  - [DATE]  [DATE]

RESULT:     Comparison is made to a study [DATE].

The fluffy alveolar densities in the perihilar region have largely resolved.
The lungs are well expanded. The cardiac silhouette remains minimally
prominent in size. The central pulmonary vascularity is less prominent than
on the prior study. No more than a trace of pleural fluid is felt to be
present. There are degenerative changes of the thoracic spine, and there is
tortuosity of the descending thoracic aorta.
IMPRESSION: There has been interval improvement in the appearance of
the pulmonary interstitium consistent with resolving congestive heart
failure.

## 2008-08-19 ENCOUNTER — Emergency Department: Payer: Self-pay | Admitting: Emergency Medicine

## 2008-08-19 IMAGING — CR DG CHEST 1V PORT
1 series · 1 of 1 positions shown · non-contrast
Comparison: none

REASON FOR EXAM: hypertension   rm 7
COMMENTS:   LMP: (Male)

PROCEDURE:     DXR - DXR PORTABLE CHEST SINGLE VIEW  - [DATE] [DATE]
RESULT:     Comparison: [DATE]

[view not recorded]
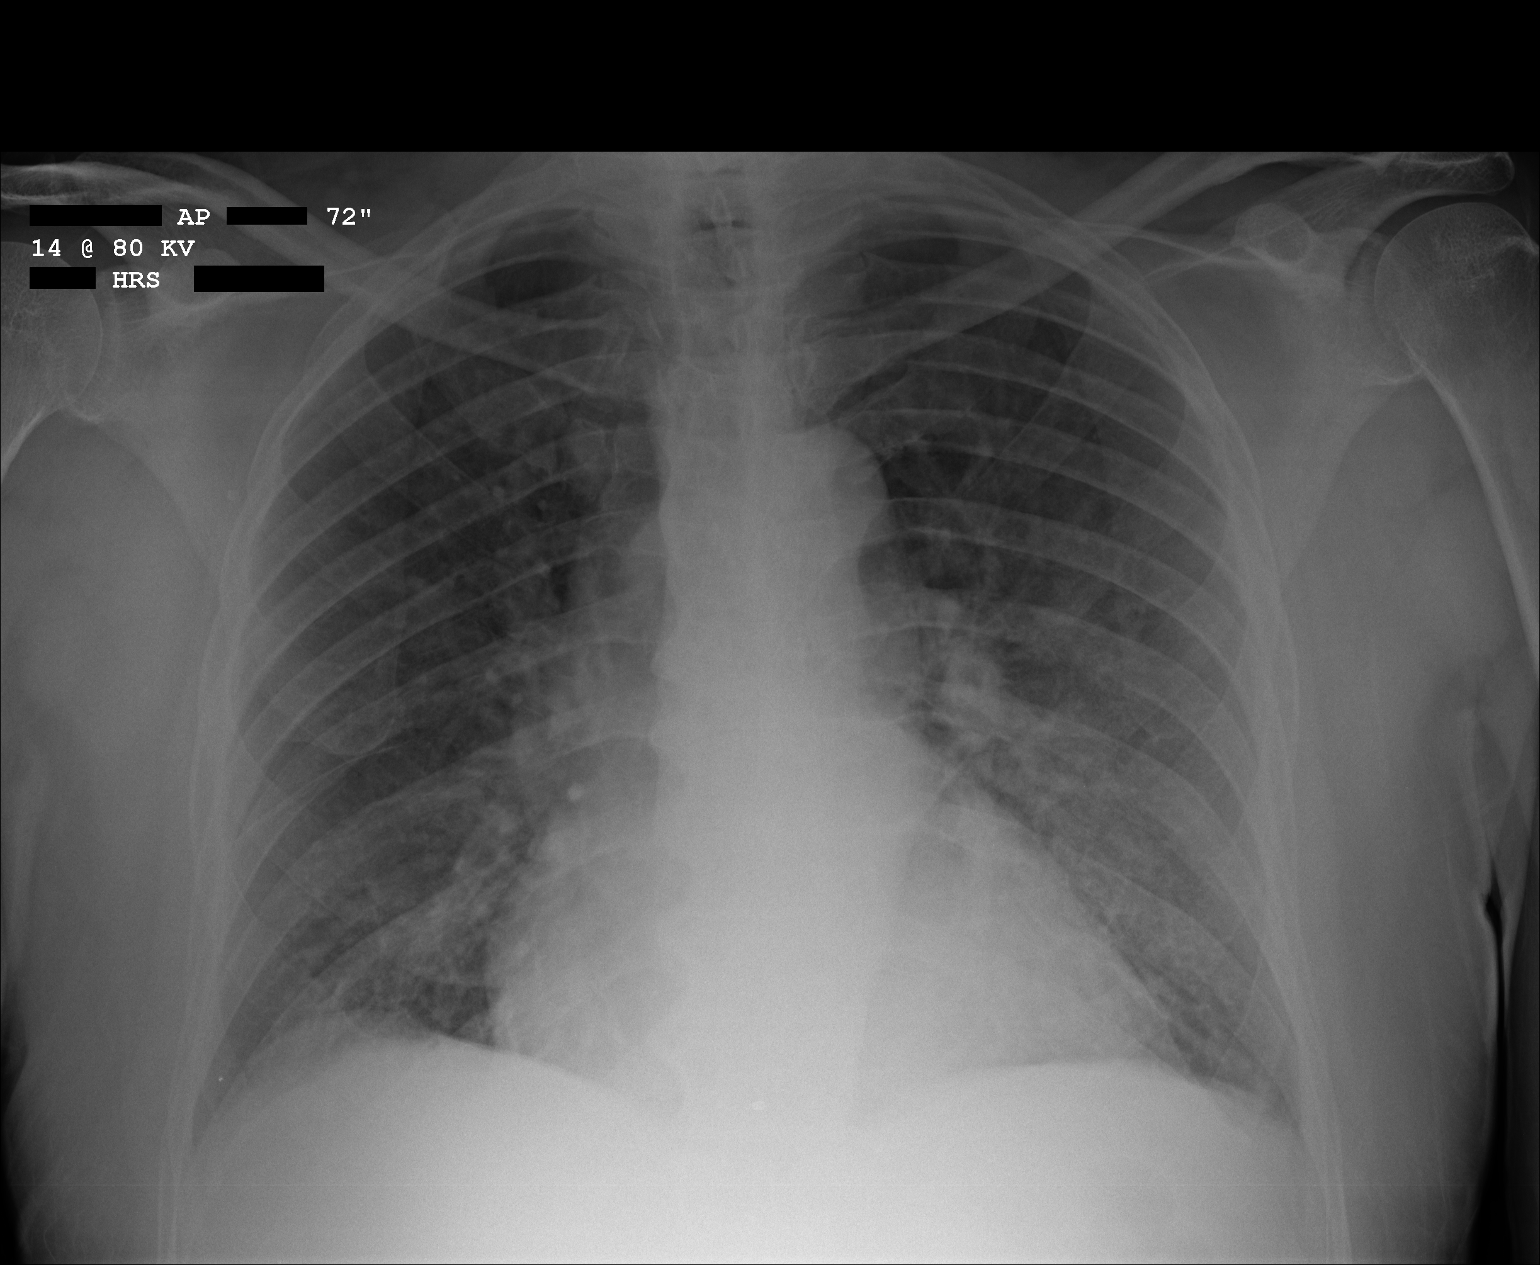

[1 of 1 positions shown; findings below may reference images not displayed]

FINDINGS: Single portable AP chest radiograph is provided. There is no focal
parenchymal opacity, pleural effusion, or pneumothorax. There is bilateral
lower lung interstitial prominence without overt congestive failure. The
heart size is enlarged. The osseous structures are unremarkable.
IMPRESSION: Cardiomegaly without overt congestive failure.

## 2009-08-11 ENCOUNTER — Emergency Department: Payer: Self-pay | Admitting: Internal Medicine

## 2009-10-01 ENCOUNTER — Emergency Department: Payer: Self-pay | Admitting: Emergency Medicine

## 2009-12-03 ENCOUNTER — Emergency Department: Payer: Self-pay | Admitting: Emergency Medicine

## 2009-12-03 IMAGING — CR DG CHEST 2V
1 series · 2 of 2 positions shown · non-contrast
Comparison: none

REASON FOR EXAM: SOB
COMMENTS:

[Series 1: view not recorded · 0.17mm/px · 2 of 2 slices shown]
[im 1/2]
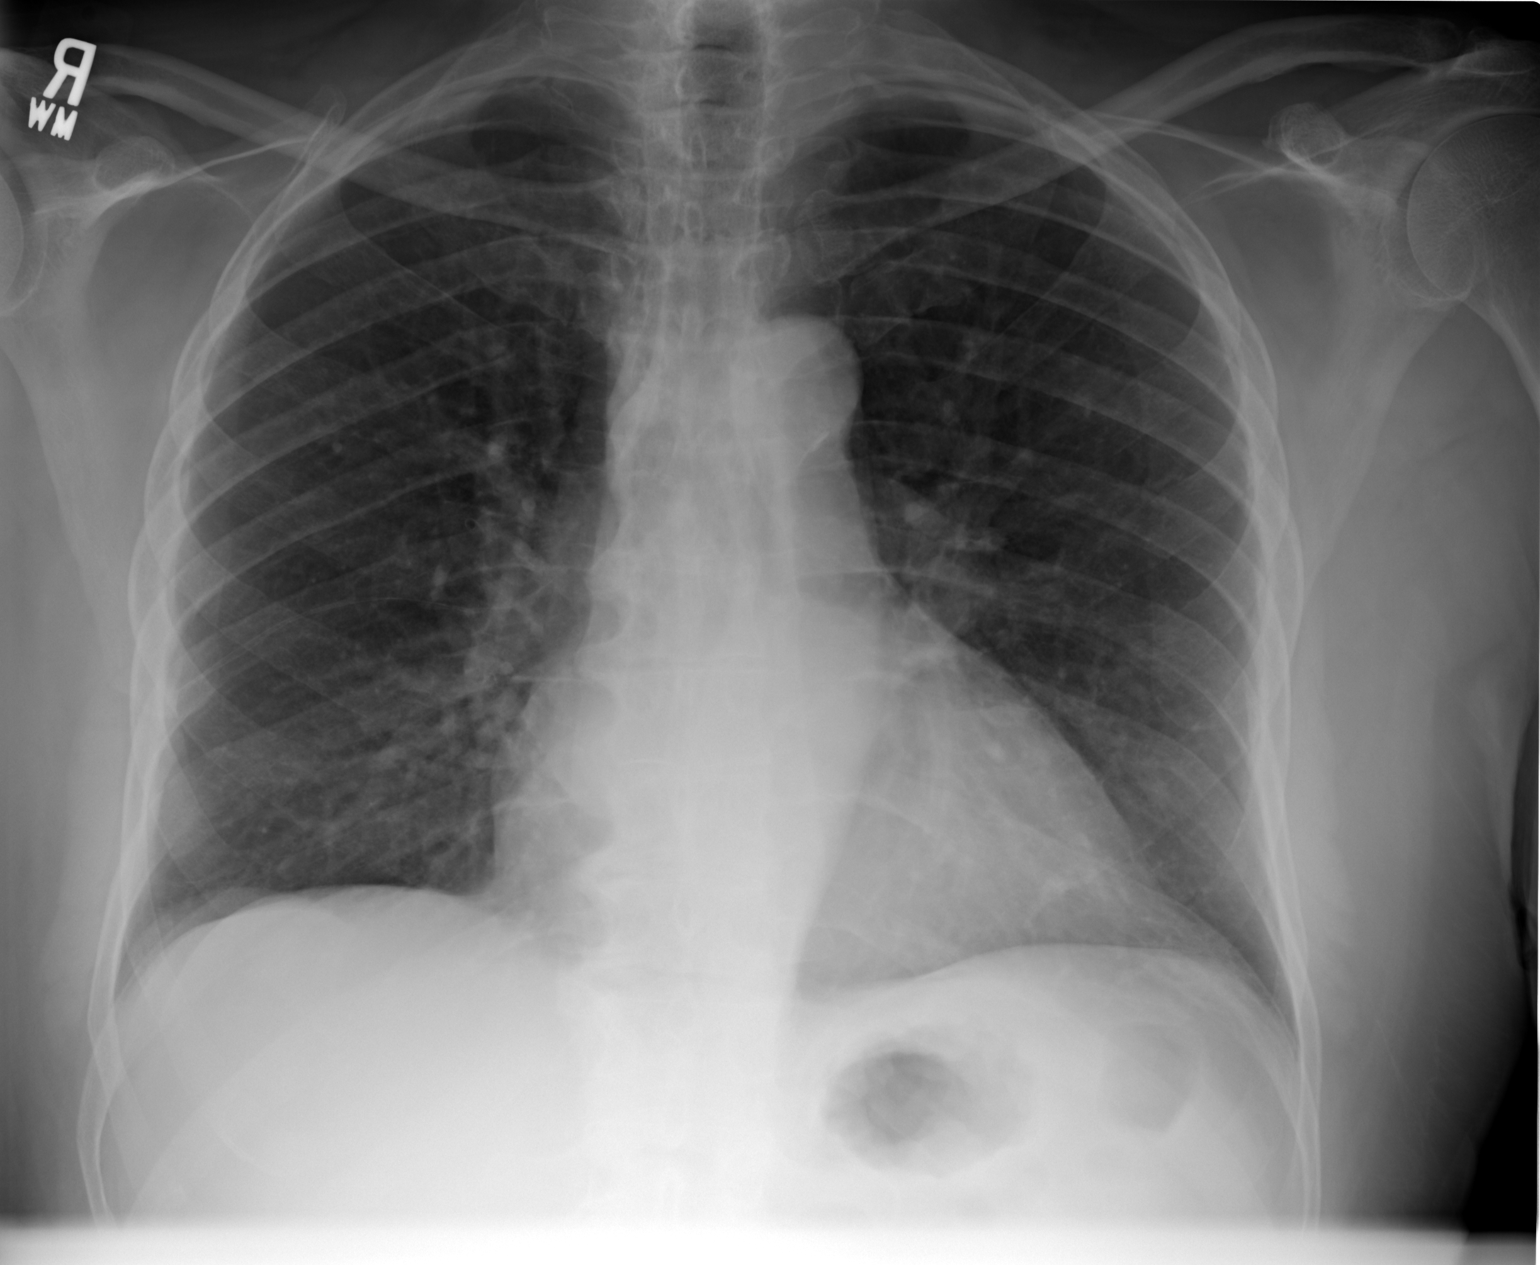
[im 2/2]
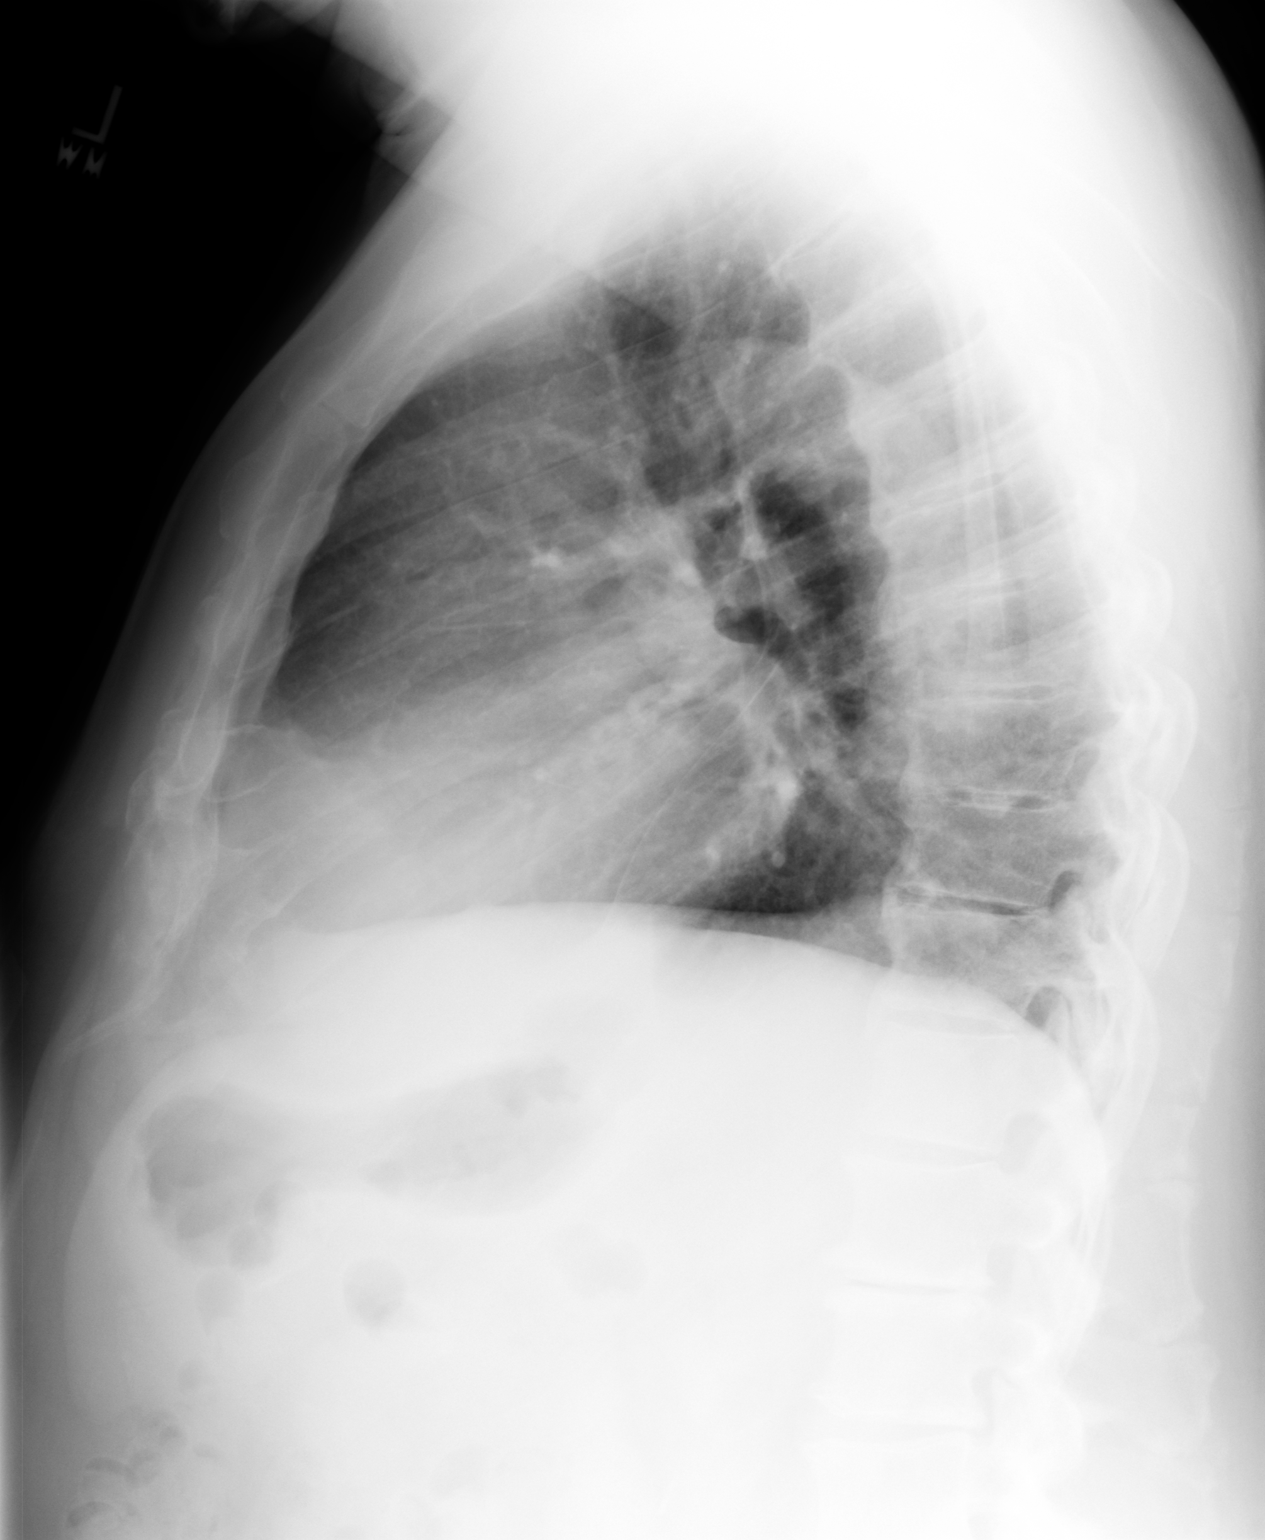

[2 of 2 positions shown; findings below may reference images not displayed]

PROCEDURE:     DXR - DXR CHEST PA (OR AP) AND LATERAL  - [DATE]  [DATE]

RESULT:     Comparison is made to the prior exam of [DATE]. The lung
fields are clear. No pneumonia, pneumothorax or pleural effusion is seen. No
interstitial or pulmonary edema is noted. The heart appears mildly enlarged.
The mediastinal and osseous structures show no acute changes. Degenerative
spurring is noted at multiple levels of the thoracic spine.
IMPRESSION: 1. The lung fields are clear.
2. The heart appears mildly enlarged but is stable in size as compared to
the exam of [DATE].

## 2010-01-25 ENCOUNTER — Emergency Department: Payer: Self-pay | Admitting: Emergency Medicine

## 2010-02-03 ENCOUNTER — Ambulatory Visit: Payer: Self-pay | Admitting: Family Medicine

## 2010-02-03 IMAGING — CR DG CHEST 2V
1 series · 2 of 2 positions shown · non-contrast
Comparison: none

REASON FOR EXAM: Cough, shortness of breath
COMMENTS:

[Series 1: view not recorded · 0.17mm/px · 2 of 2 slices shown]
[im 1/2]
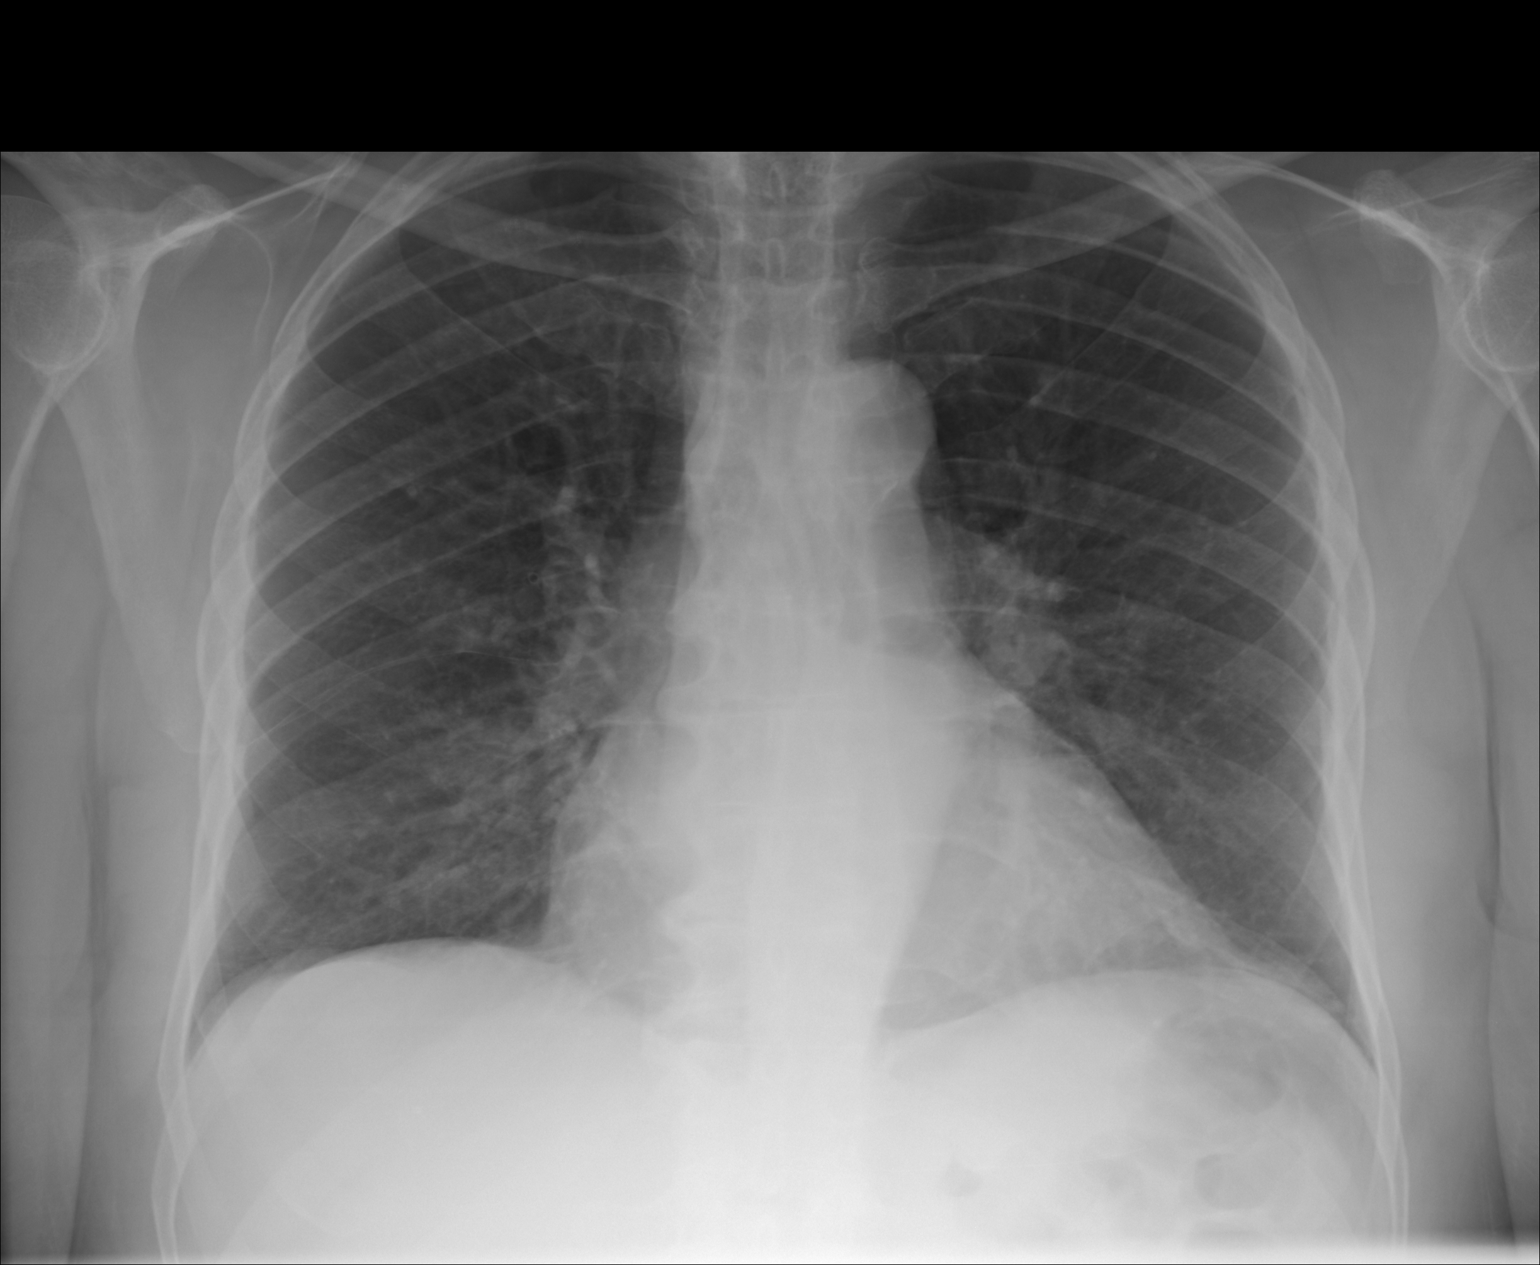
[im 2/2]
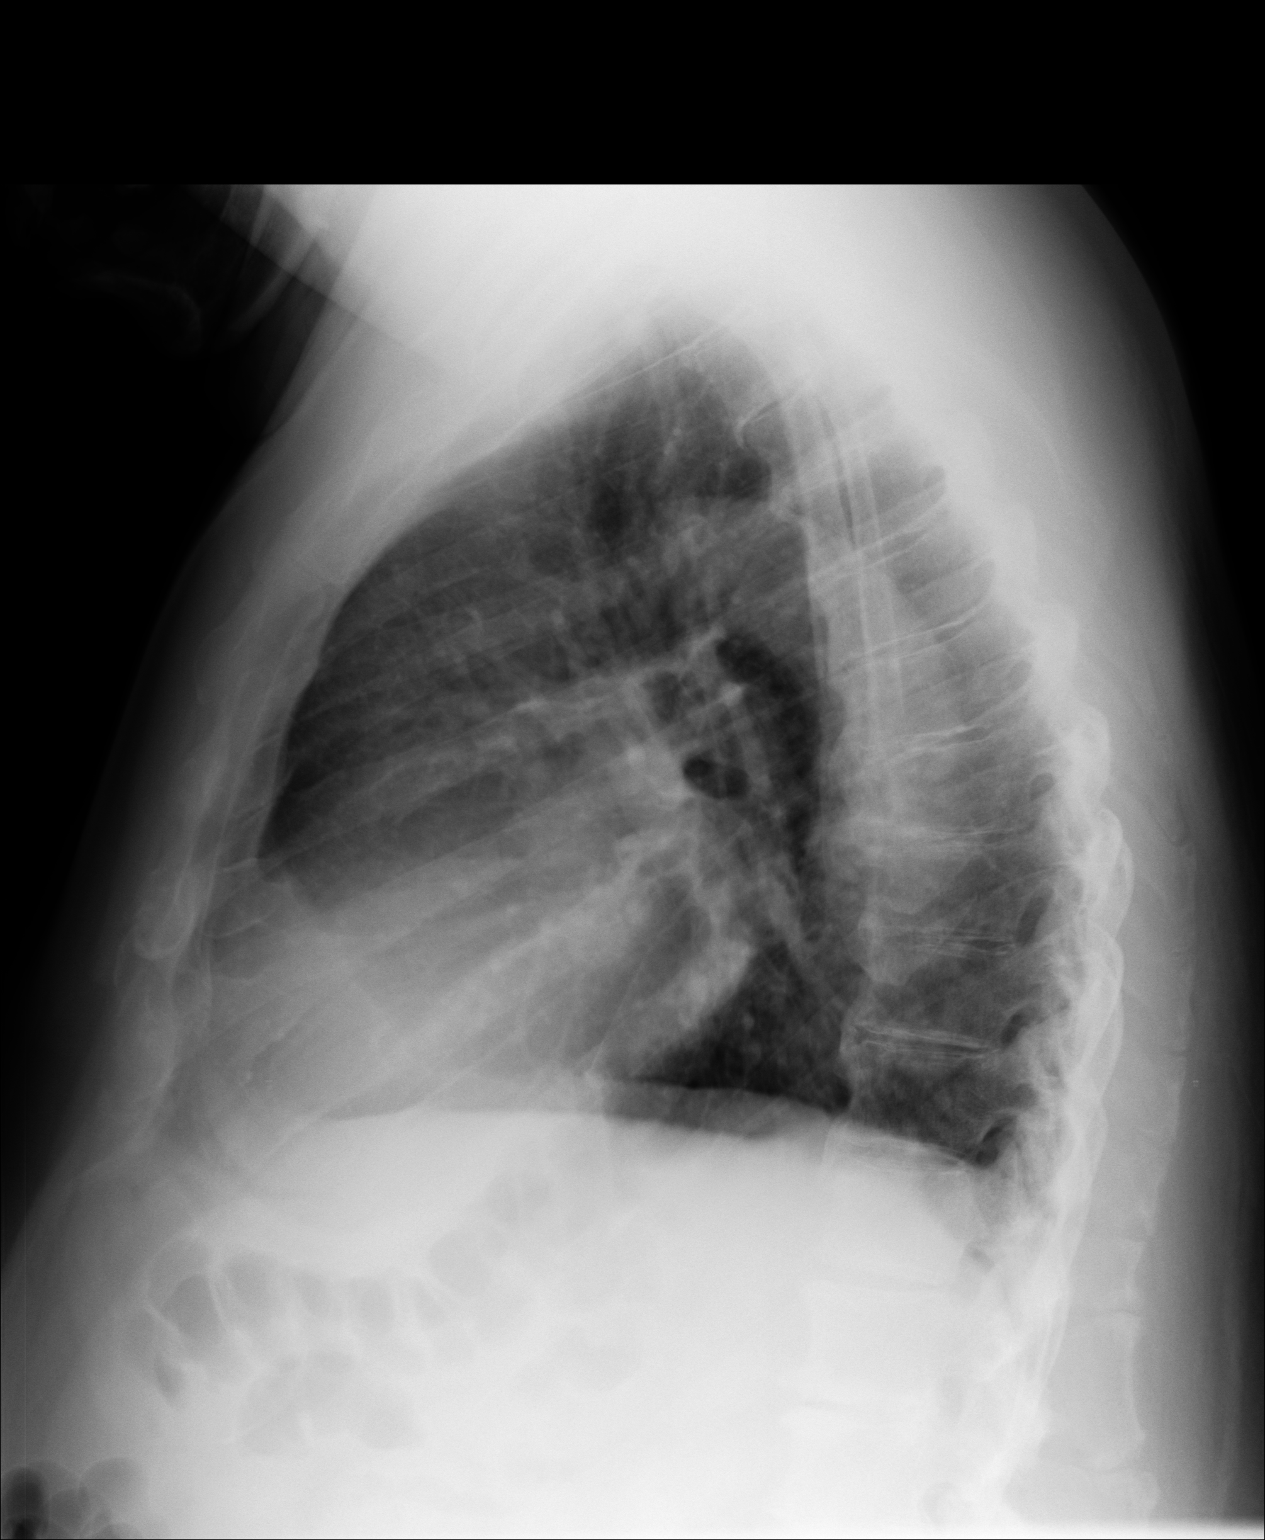

[2 of 2 positions shown; findings below may reference images not displayed]

PROCEDURE:     DXR - DXR CHEST PA (OR AP) AND LATERAL  - [DATE] [DATE]

RESULT:     Comparison is made to prior study dated [DATE].

The patient has taken a shallow inspiration. There is prominence of the
interstitial markings. No focal regions of consolidation or focal
infiltrates are appreciated. The cardiac silhouette is moderately enlarged.
The visualized bony skeleton is unremarkable.
IMPRESSION: Early or mild interstitial infiltrate, edematous versus
non-edematous. No focal regions of consolidation.

## 2011-01-14 ENCOUNTER — Emergency Department: Payer: Self-pay | Admitting: Emergency Medicine

## 2011-01-14 IMAGING — CR DG CHEST 2V
1 series · 2 of 2 positions shown · non-contrast
Comparison: none

REASON FOR EXAM: MICALLEF
COMMENTS:

[Series 1: view not recorded · 0.17mm/px · 2 of 2 slices shown]
[im 1/2]
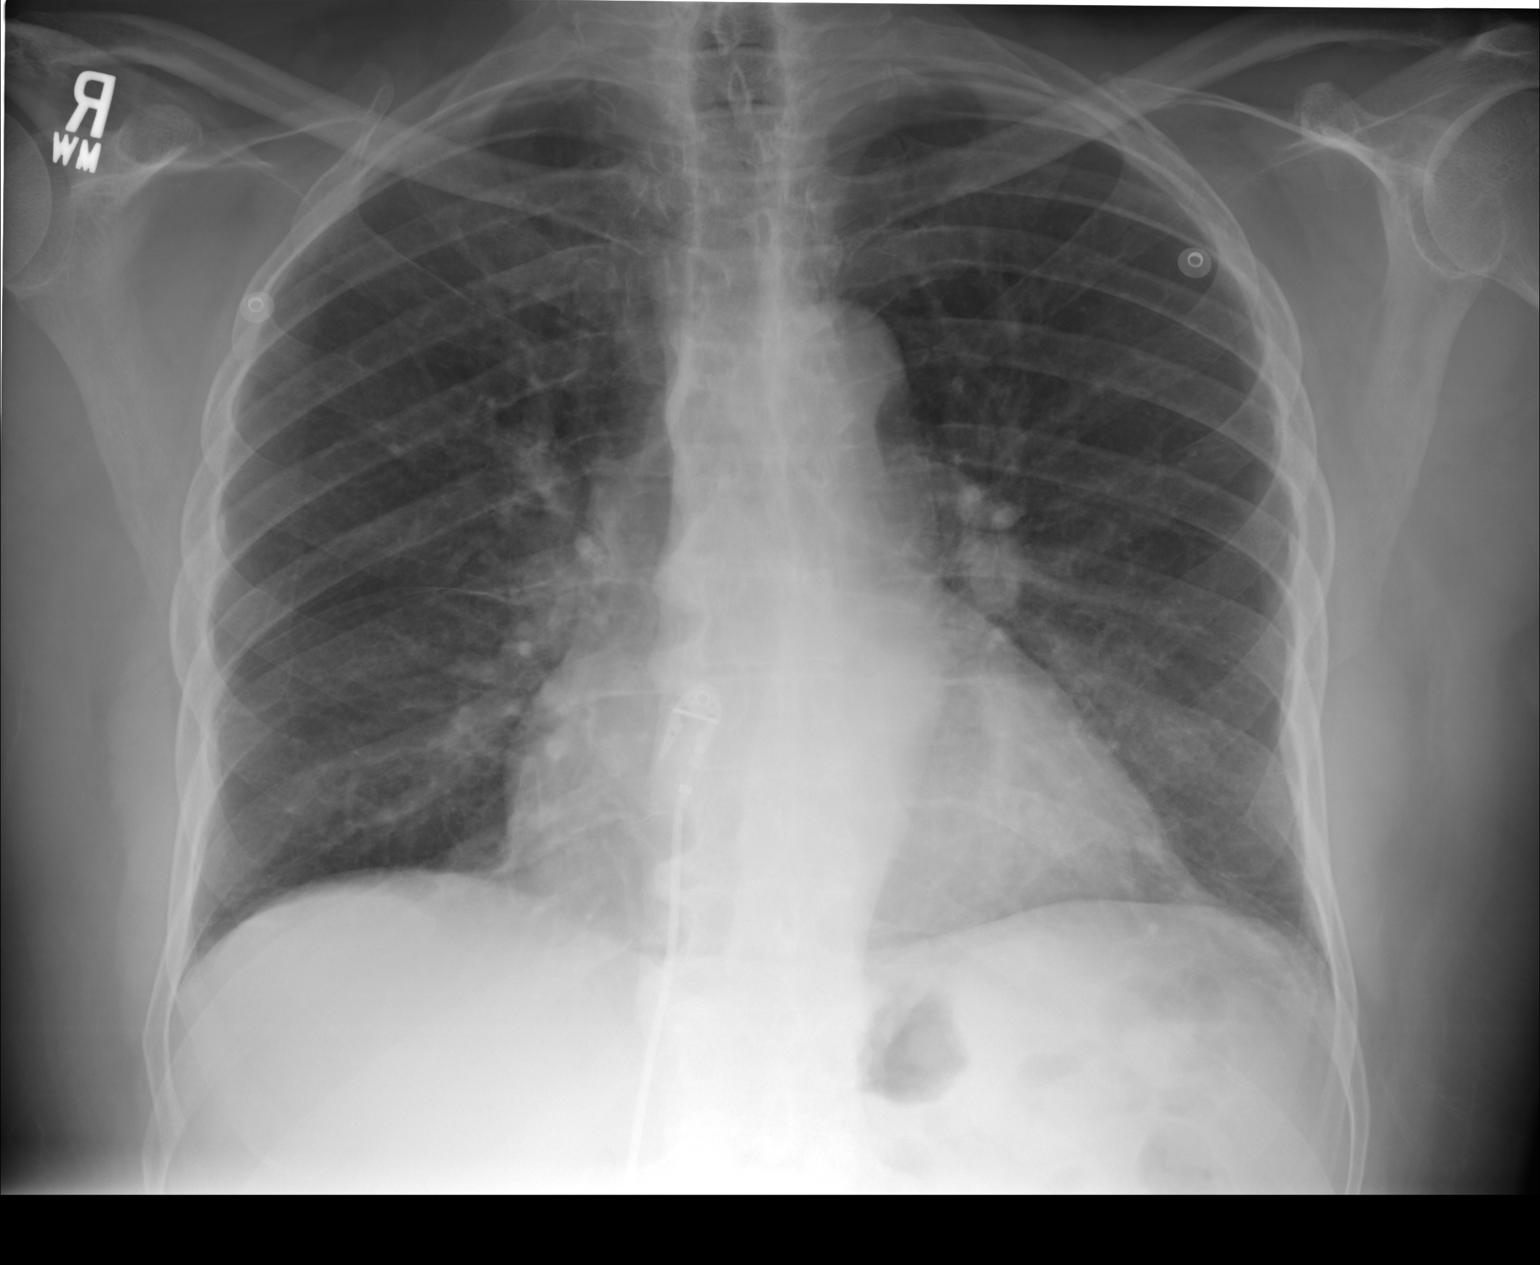
[im 2/2]
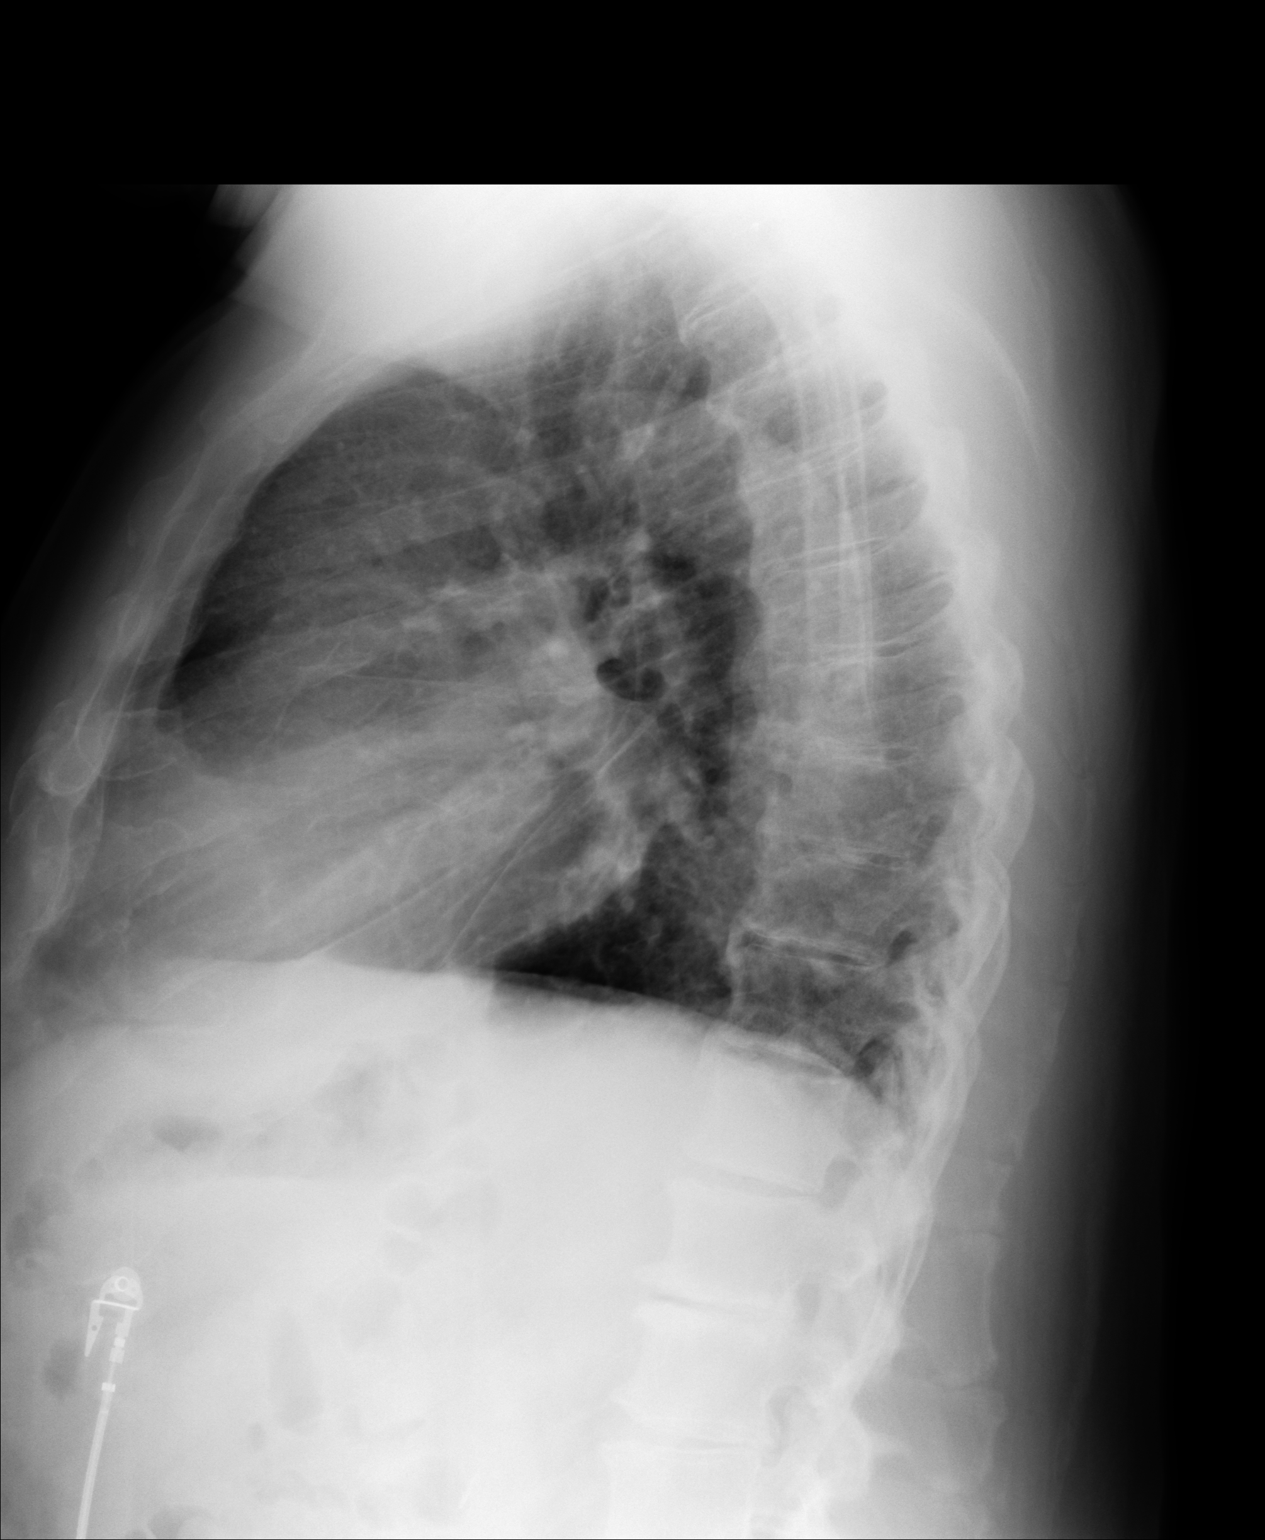

[2 of 2 positions shown; findings below may reference images not displayed]

PROCEDURE:     DXR - DXR CHEST PA (OR AP) AND LATERAL  - [DATE]  [DATE]

RESULT:     Comparison is made to a prior study dated [DATE].

The patient has taken a shallow inspiration. There is thickening of the
interstitial markings and mild bronchial cuffing. No focal regions of
consolidation are notified. The cardiac silhouette and visualized bony
skeleton is unremarkable.
IMPRESSION: Interstitial infiltrate edematous versus nonedematous.

## 2011-12-20 ENCOUNTER — Emergency Department: Payer: Self-pay | Admitting: *Deleted

## 2011-12-20 IMAGING — CR DG KNEE COMPLETE 4+V*L*
1 series · 4 of 4 positions shown · non-contrast
Comparison: none

REASON FOR EXAM: pain, swelling
COMMENTS:

PROCEDURE:     DXR - DXR KNEE LT COMP WITH OBLIQUES  - [DATE]  [DATE]
RESULT:     Comparison: None.

[Series 1: ap · 0.17mm/px · 4 of 4 slices shown]
[im 1/4]
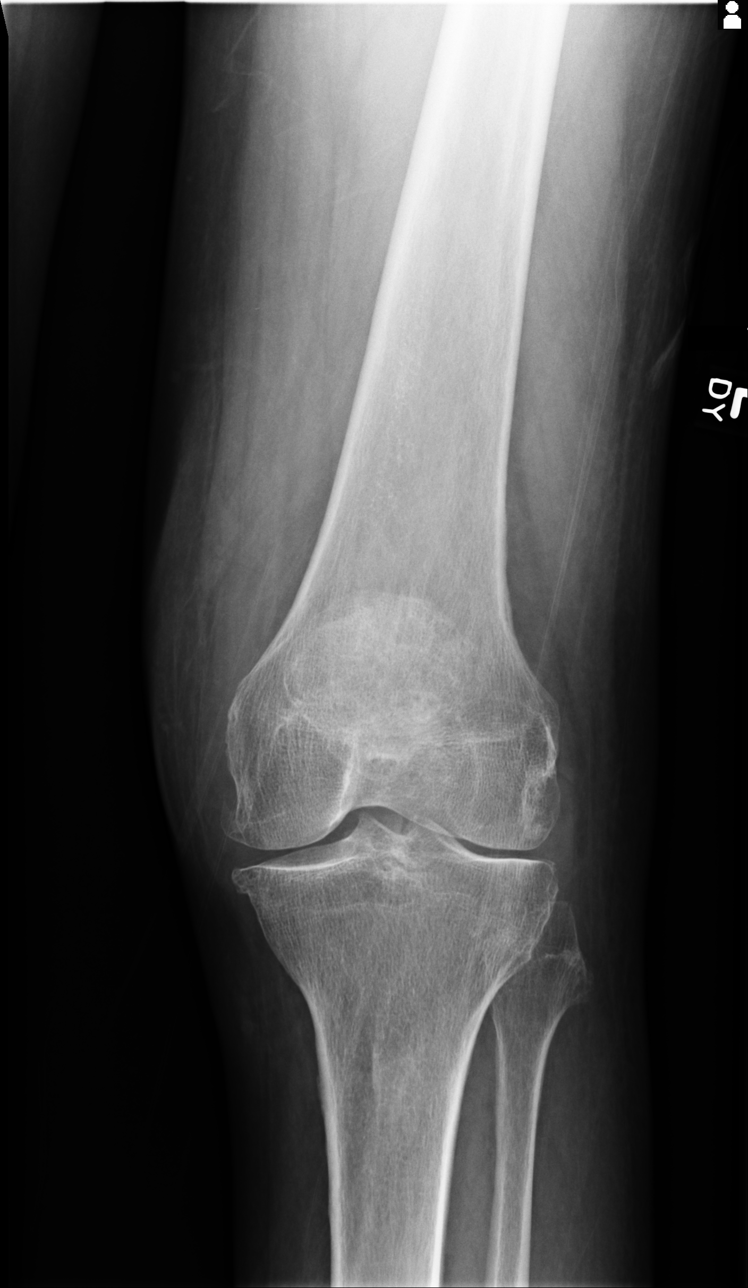
[im 2/4]
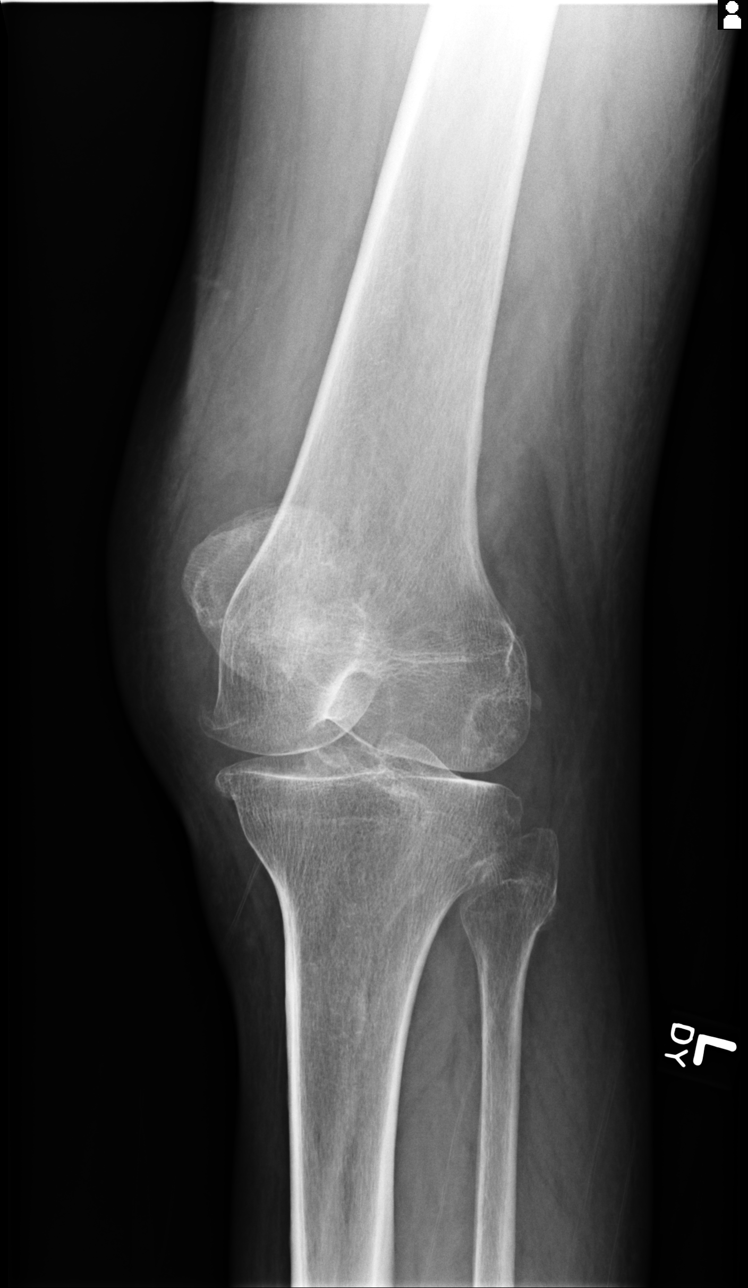
[im 3/4]
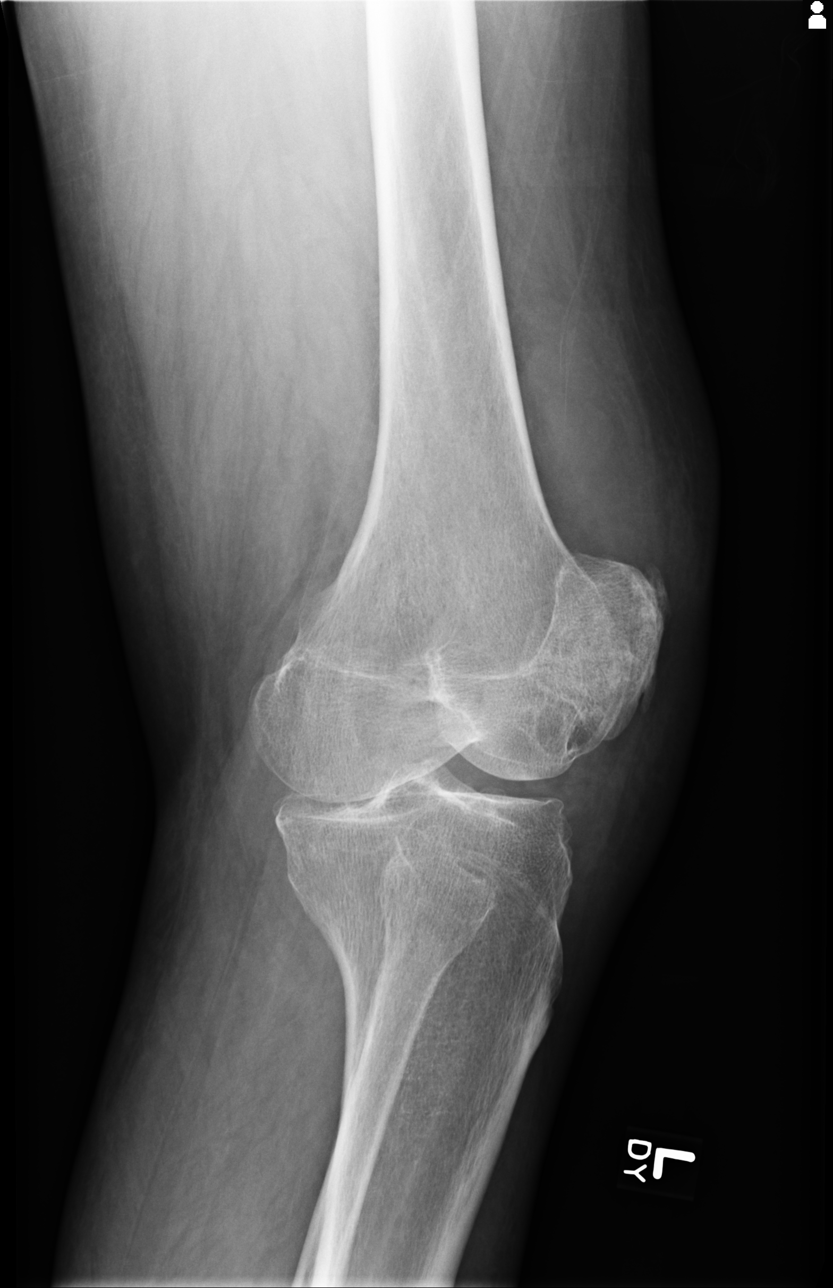
[im 4/4]
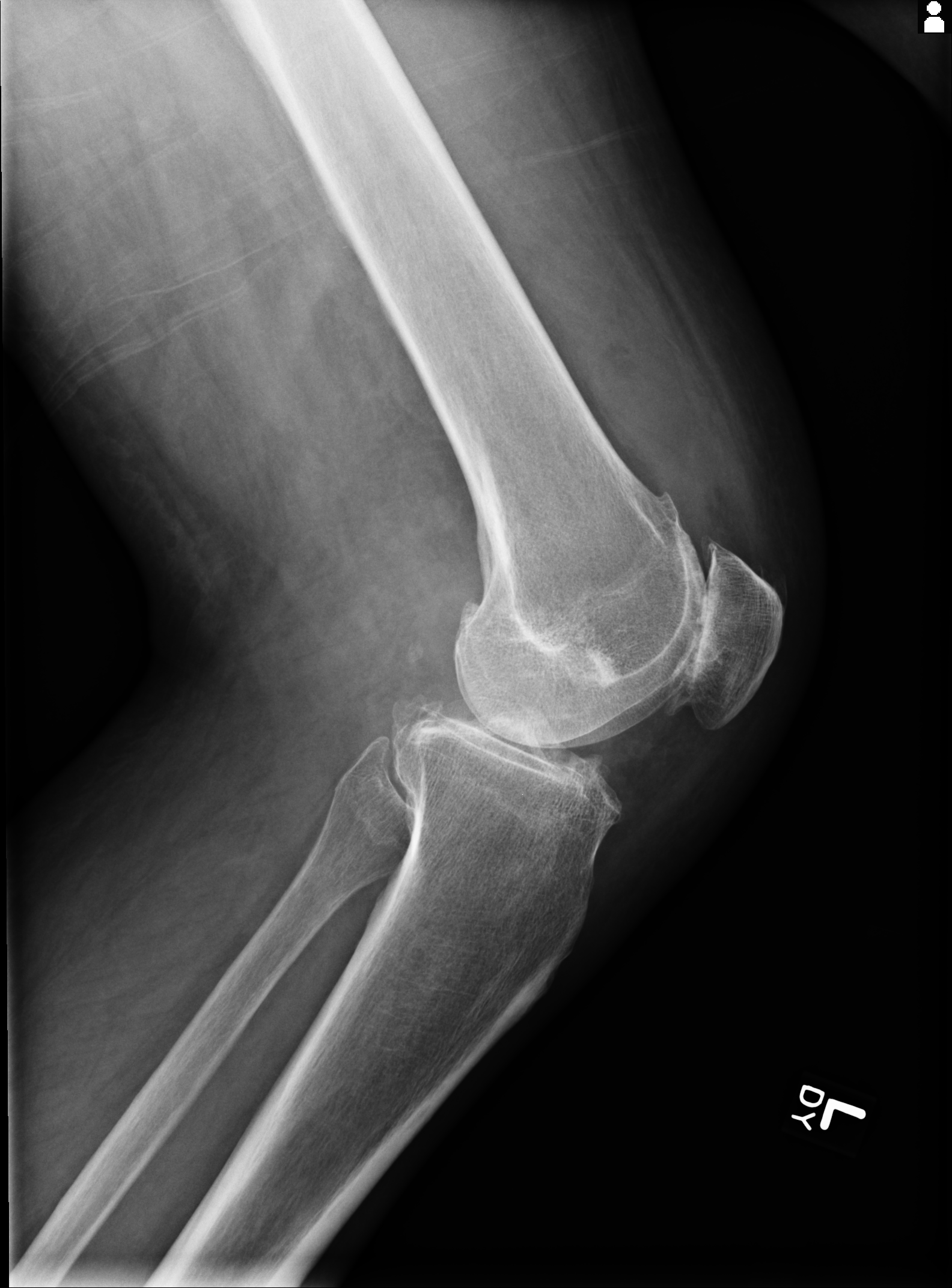

[4 of 4 positions shown; findings below may reference images not displayed]

FINDINGS: There is a small knee effusion. Mild tricompartmental degenerative changes
are seen. No acute fracture.
IMPRESSION: Mild tricompartmental changes, with small knee effusion.

## 2012-05-15 ENCOUNTER — Emergency Department: Payer: Self-pay | Admitting: Emergency Medicine

## 2012-06-05 ENCOUNTER — Emergency Department: Payer: Self-pay | Admitting: Emergency Medicine

## 2012-09-23 ENCOUNTER — Emergency Department: Payer: Self-pay | Admitting: Emergency Medicine

## 2012-09-23 LAB — COMPREHENSIVE METABOLIC PANEL
Albumin: 4 g/dL (ref 3.4–5.0)
Alkaline Phosphatase: 98 U/L (ref 50–136)
Anion Gap: 7 (ref 7–16)
BUN: 18 mg/dL (ref 7–18)
Bilirubin,Total: 0.5 mg/dL (ref 0.2–1.0)
Calcium, Total: 9.8 mg/dL (ref 8.5–10.1)
Chloride: 108 mmol/L — ABNORMAL HIGH (ref 98–107)
Co2: 28 mmol/L (ref 21–32)
Creatinine: 1.82 mg/dL — ABNORMAL HIGH (ref 0.60–1.30)
EGFR (African American): 42 — ABNORMAL LOW
EGFR (Non-African Amer.): 36 — ABNORMAL LOW
Glucose: 112 mg/dL — ABNORMAL HIGH (ref 65–99)
Osmolality: 288 (ref 275–301)
Potassium: 3.9 mmol/L (ref 3.5–5.1)
SGOT(AST): 40 U/L — ABNORMAL HIGH (ref 15–37)
SGPT (ALT): 23 U/L (ref 12–78)
Sodium: 143 mmol/L (ref 136–145)
Total Protein: 8.1 g/dL (ref 6.4–8.2)

## 2012-09-23 LAB — URINALYSIS, COMPLETE
Bilirubin,UR: NEGATIVE
Glucose,UR: 50 mg/dL (ref 0–75)
Ketone: NEGATIVE
Leukocyte Esterase: NEGATIVE
Nitrite: NEGATIVE
Ph: 6 (ref 4.5–8.0)
Protein: 100
RBC,UR: 13 /HPF (ref 0–5)
Specific Gravity: 1.014 (ref 1.003–1.030)
Squamous Epithelial: NONE SEEN
WBC UR: 4 /HPF (ref 0–5)

## 2012-09-23 LAB — CBC
HCT: 45.3 % (ref 40.0–52.0)
HGB: 15.3 g/dL (ref 13.0–18.0)
MCH: 28.8 pg (ref 26.0–34.0)
MCHC: 33.9 g/dL (ref 32.0–36.0)
MCV: 85 fL (ref 80–100)
Platelet: 246 10*3/uL (ref 150–440)
RBC: 5.33 10*6/uL (ref 4.40–5.90)
RDW: 14.2 % (ref 11.5–14.5)
WBC: 11 10*3/uL — ABNORMAL HIGH (ref 3.8–10.6)

## 2012-09-23 LAB — LIPASE, BLOOD: Lipase: 145 U/L (ref 73–393)

## 2012-09-23 IMAGING — CT CT STONE STUDY
1 of 2 series · 14 of 32 positions shown, 18 images · non-contrast
Comparison: none

REASON FOR EXAM: flank pain R
COMMENTS:

[Series 2: 3mm soft tissue · axial · 0.80mm/px · z∈[-629,-236]mm · 14 of 149 slices shown, 18 images]
[im 12/149  soft-tissue]
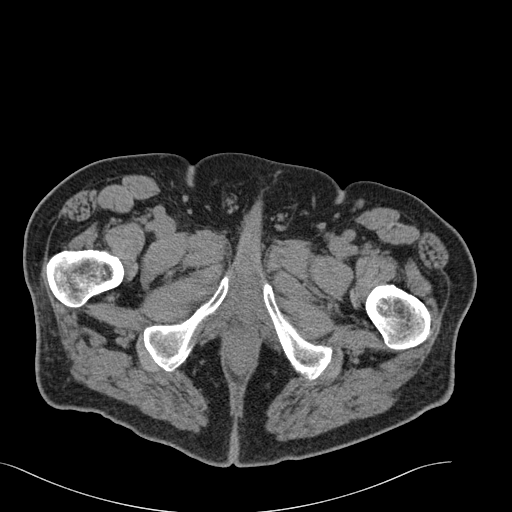
[im 12/149  bone]
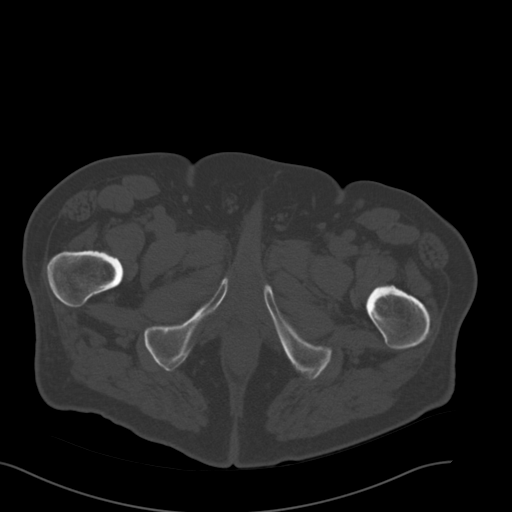
[im 24/149  soft-tissue]
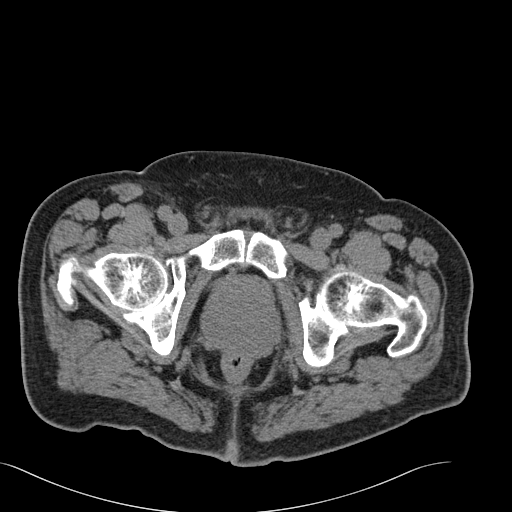
[im 36/149  soft-tissue]
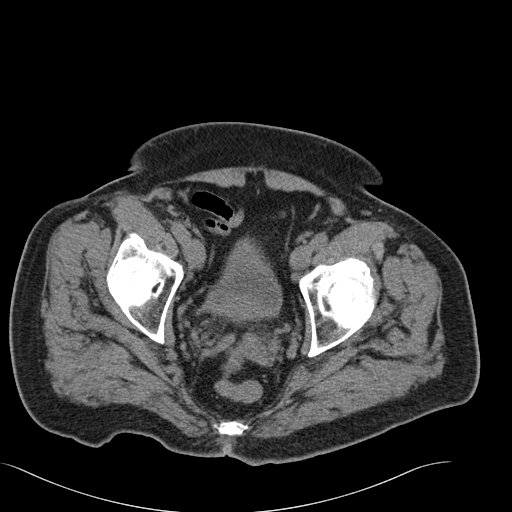
[im 48/149  soft-tissue]
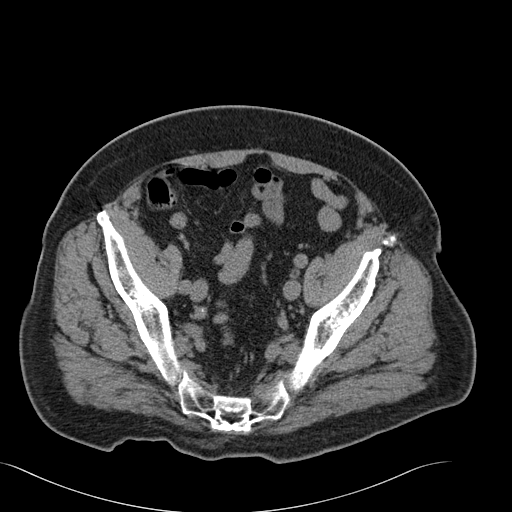
[im 60/149  soft-tissue]
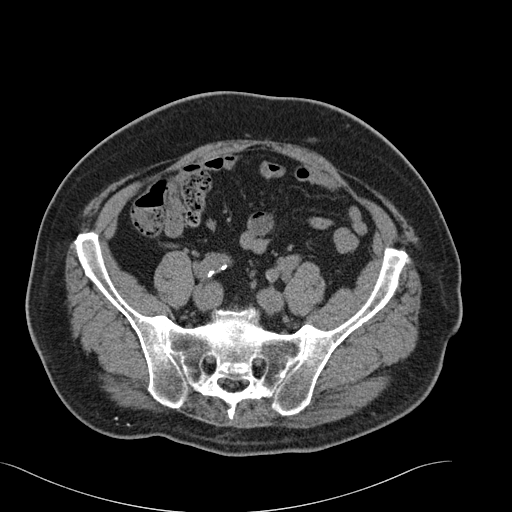
[im 72/149  soft-tissue]
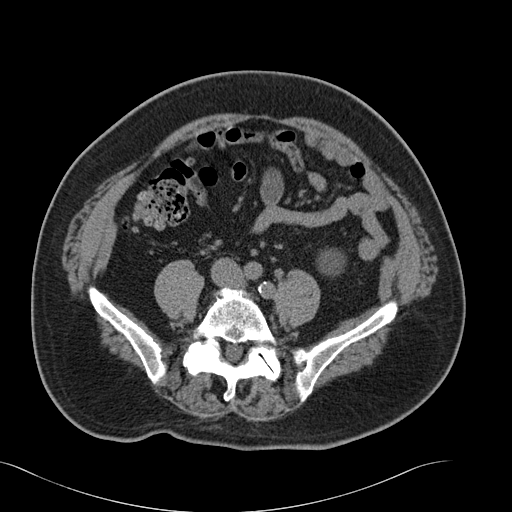
[im 83/149  soft-tissue]
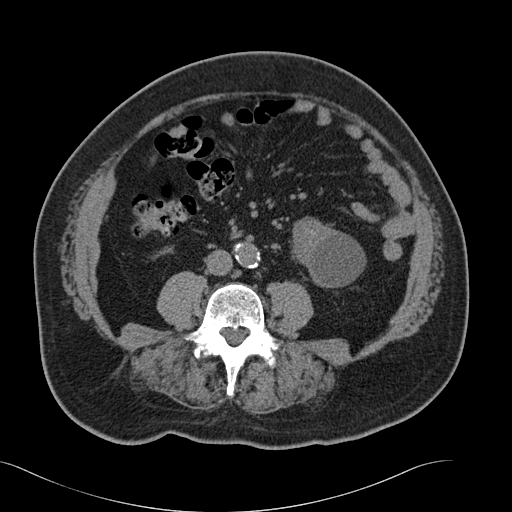
[im 95/149  soft-tissue]
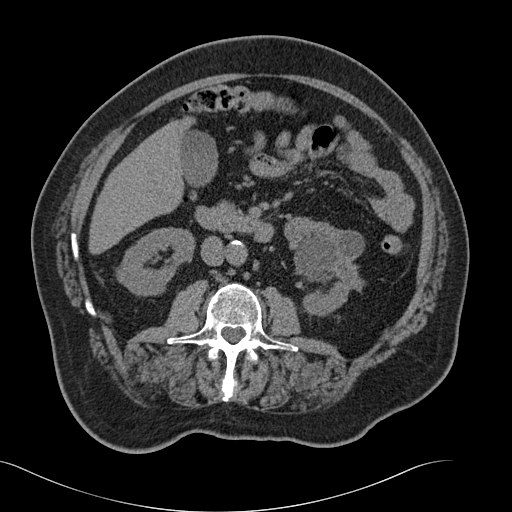
[im 107/149  soft-tissue]
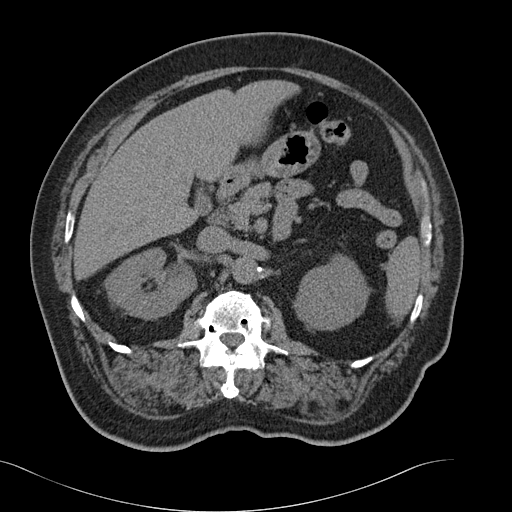
[im 107/149  bone]
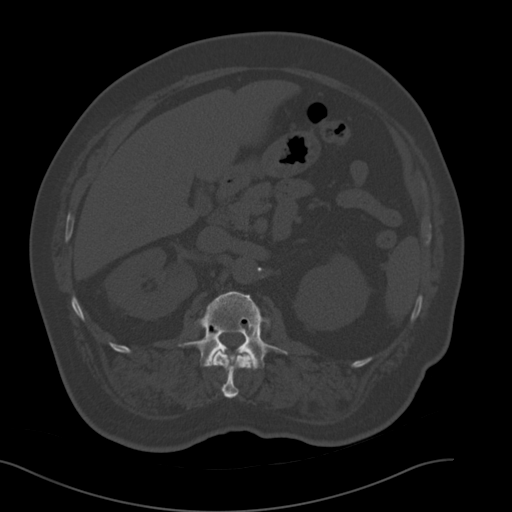
[im 119/149  soft-tissue]
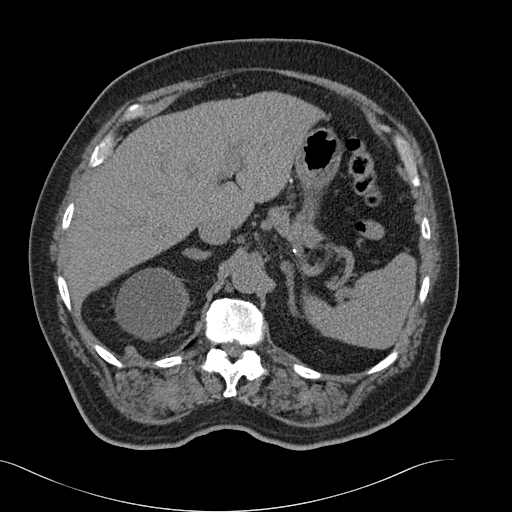
[im 125/149  lung]
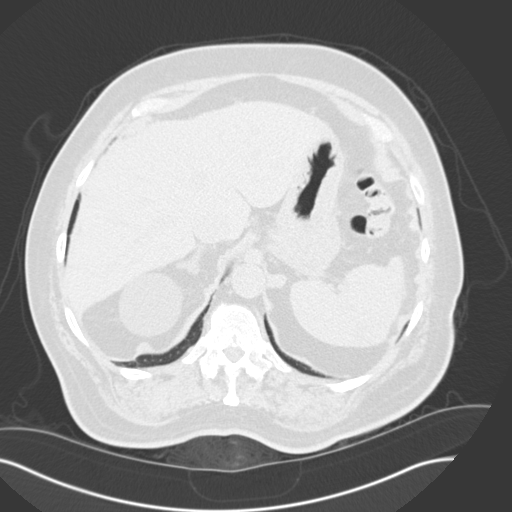
[im 131/149  soft-tissue]
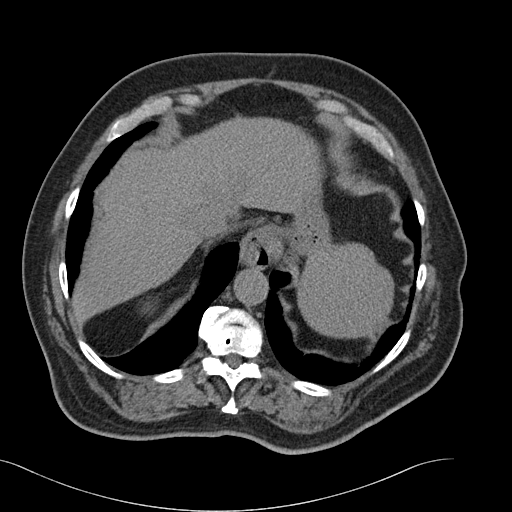
[im 131/149  lung]
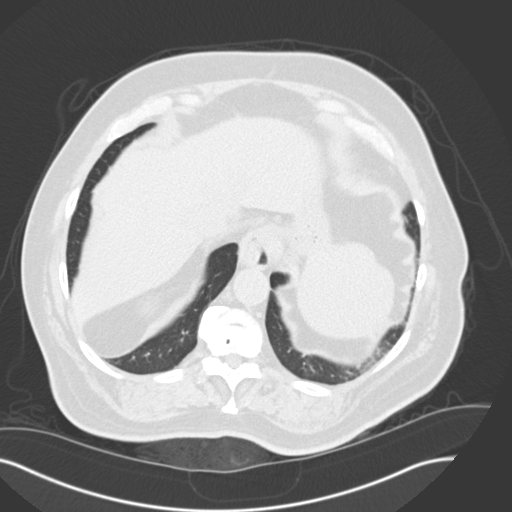
[im 137/149  lung]
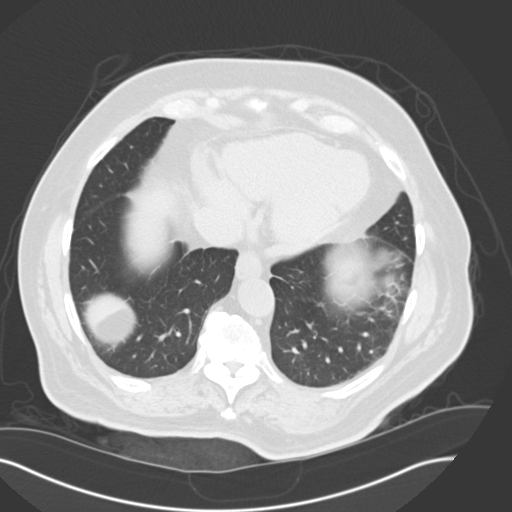
[im 143/149  soft-tissue]
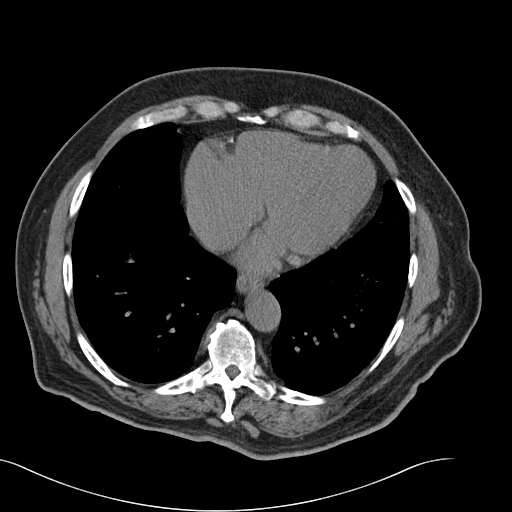
[im 143/149  lung]
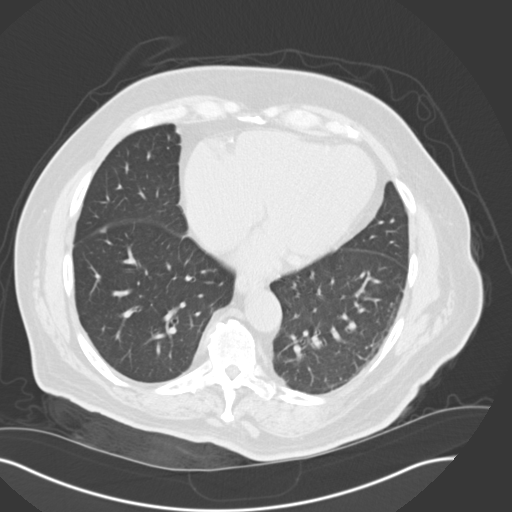

[14 of 32 positions shown; findings below may reference images not displayed]

PROCEDURE:     CT  - CT ABDOMEN /PELVIS WO (STONE)  - [DATE]  [DATE]

RESULT:     Axial noncontrast CT scanning was performed through the abdomen
and pelvis with reconstructions at 3 mm intervals and slice thicknesses.
Review of multiplanar reconstructed images was performed separately on the
VIA monitor.

The unopacified loops of small and large bowel exhibit no evidence of ileus
nor of obstruction. A normal calibered appendix is demonstrated on images 89
through 106. There are numerous diverticula associated with the cecum and
ascending colon. These are less conspicuous in the transverse and descending
portions of the colon. There are a few in the rectosigmoid. The liver
exhibits no focal mass nor ductal dilation. The gallbladder contains
noncalcified gas filled stones. There is no evidence of pericholecystic
fluid or inflammatory change. The pancreas, spleen, and adrenal glands
exhibit no acute abnormality. There is very mild fullness of the body of the
left adrenal gland.

The kidneys exhibit multiple cortical and parapelvic cysts. There are
nonobstructing stones in the lower pole of the left kidney. The perinephric
fat on the right exhibits minimally increased density which could reflect
inflammation.

The caliber of the abdominal aorta is normal. The prostate gland is enlarged
and produces a moderate impression upon the urinary bladder base. There is
no free fluid in the abdomen or pelvis. There are tiny fat containing
inguinal hernias bilaterally. There is no umbilical hernia. The lung bases
are clear.
IMPRESSION: 1. There is no evidence of hydronephrosis. There are cysts associated with
both kidneys and there are nonobstructing lower pole stones on the left.
There is a 1 cm diameter hyperdense presumed cyst in the lower pole of the
right kidney. There is very mildly increased density in the perinephric fat
on the right which could reflect low-grade inflammation as could be seen
with pyelonephritis.
2. There is no evidence of acute appendicitis. Numerous diverticula are
present in the right colon and in the rectosigmoid but there is no objective
evidence of acute diverticulitis.
3. There noncalcified gallstones containing gas.

[REDACTED]

## 2012-09-23 IMAGING — CR DG CHEST 2V
1 series · 2 of 2 positions shown · non-contrast
Comparison: none

REASON FOR EXAM: back pain
COMMENTS:

[Series 1: w chest pa · 0.14mm/px · 2 of 2 slices shown]
[im 1/2]
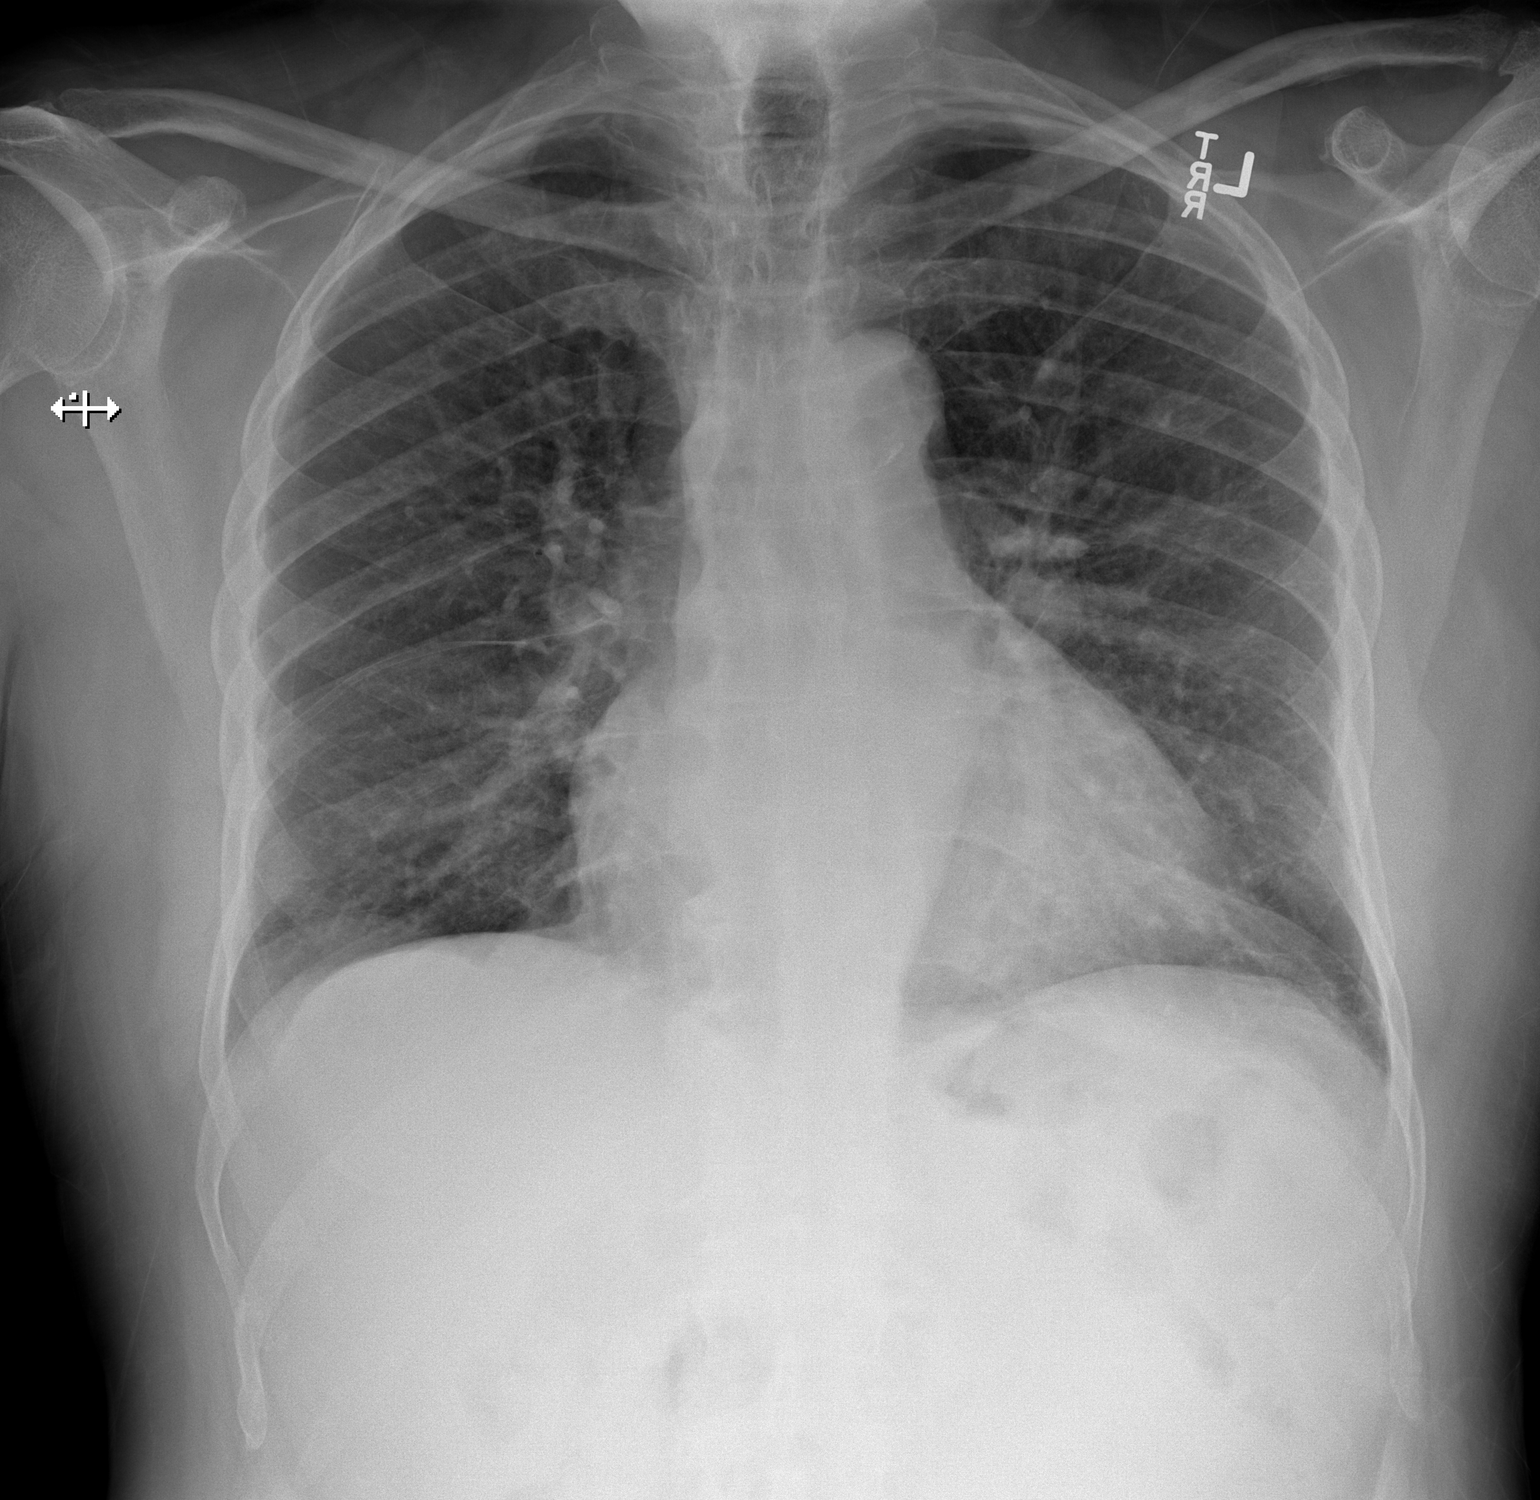
[im 2/2]
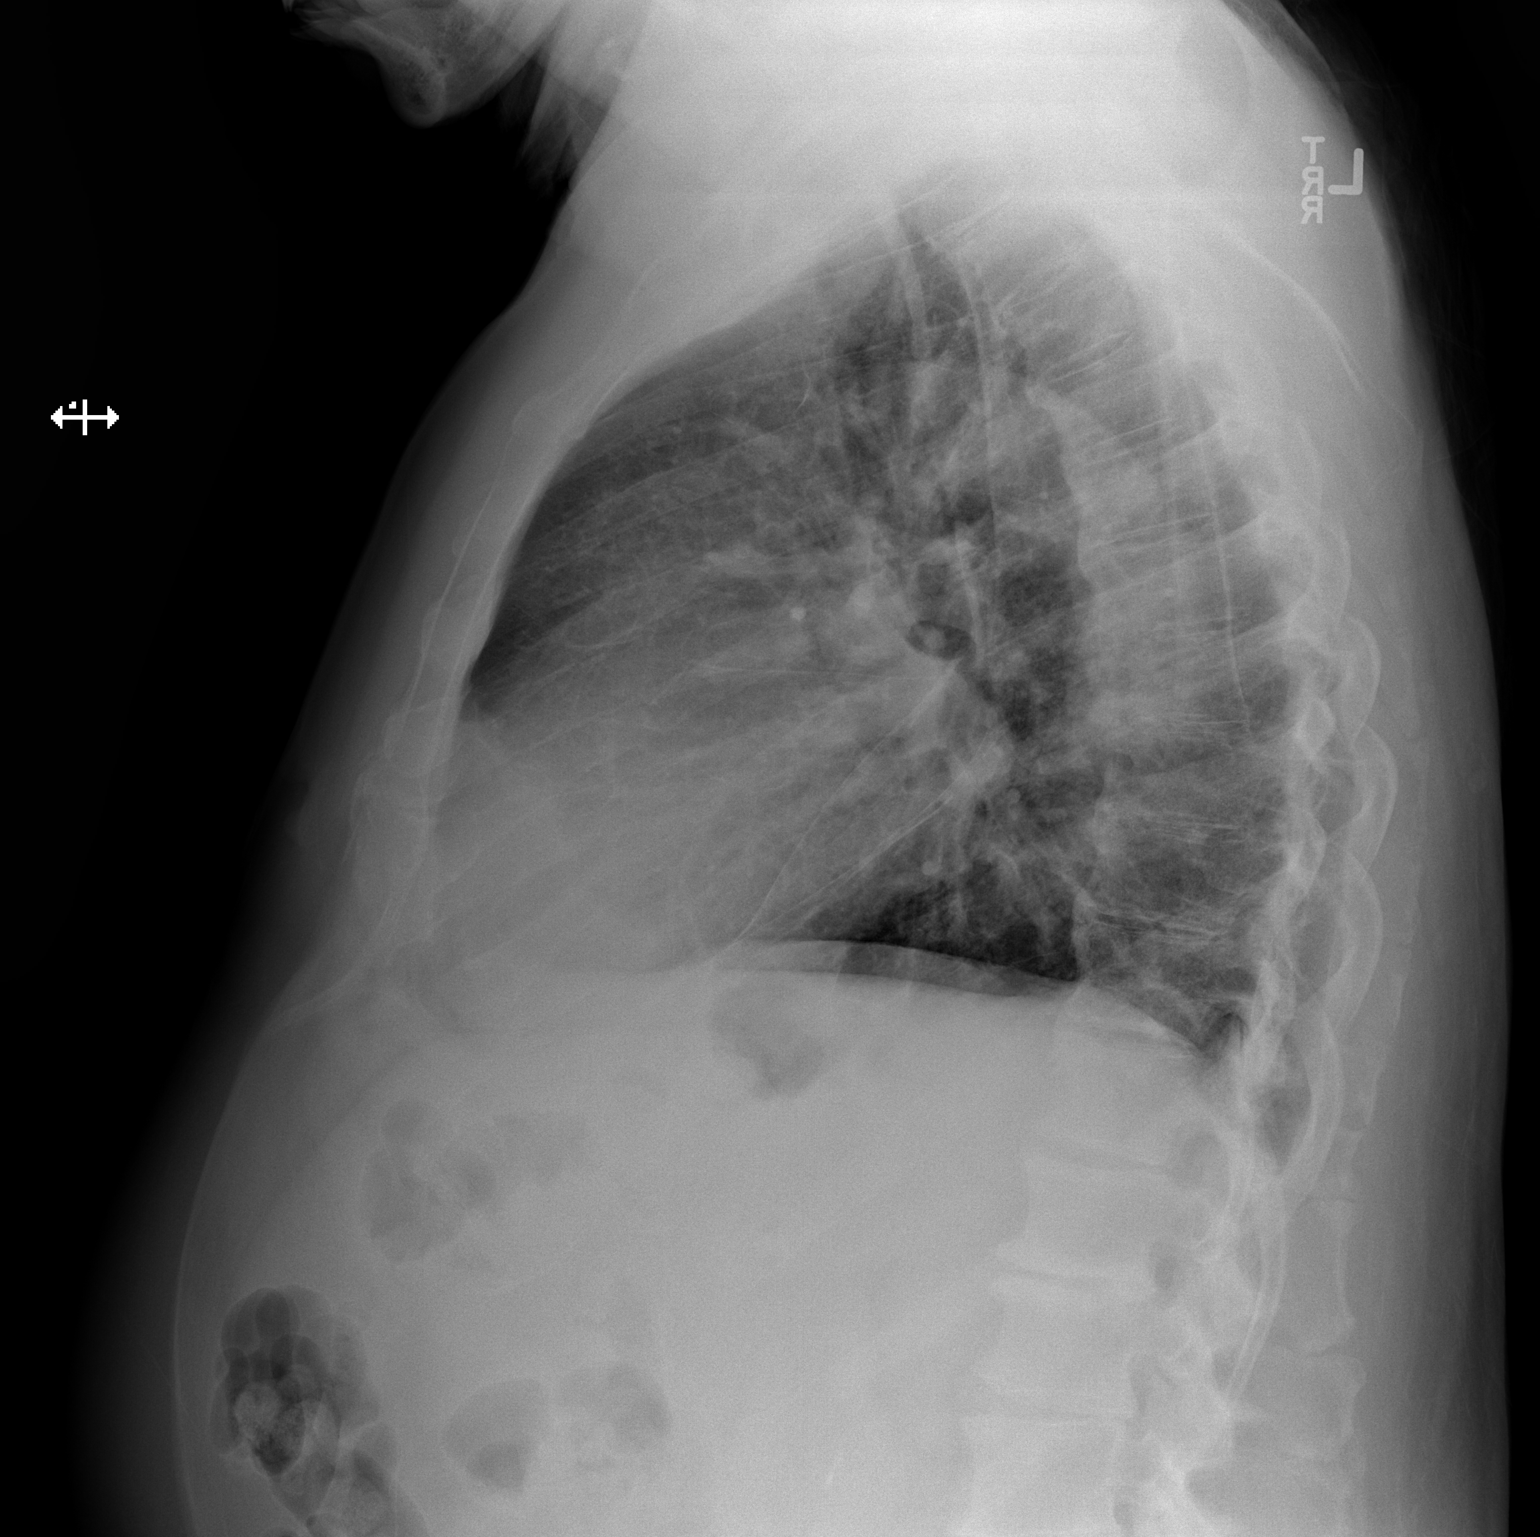

[2 of 2 positions shown; findings below may reference images not displayed]

PROCEDURE:     DXR - DXR CHEST PA (OR AP) AND LATERAL  - [DATE]  [DATE]

RESULT:     Comparison is made to a study [DATE].

The lungs are well-expanded. The interstitial markings are increased
bilaterally. This is most  conspicuous in the right lower lobe. The cardiac
silhouette is normal in size. The pulmonary vascularity is not engorged.
There is tortuosity of the descending thoracic aorta. There is no pleural
effusion or pneumothorax or pneumomediastinum.
IMPRESSION: There is increased interstitial density in both lungs which
suggests either interstitial edema or early interstitial pneumonia. There is
no classic alveolar infiltrate. There is no pleural effusion.

Followup films following therapy are recommended to assure clearing.

[REDACTED]

## 2013-10-21 ENCOUNTER — Observation Stay: Payer: Self-pay | Admitting: Internal Medicine

## 2013-10-21 LAB — BASIC METABOLIC PANEL
Anion Gap: 5 — ABNORMAL LOW (ref 7–16)
BUN: 26 mg/dL — ABNORMAL HIGH (ref 7–18)
Calcium, Total: 8.9 mg/dL (ref 8.5–10.1)
Chloride: 105 mmol/L (ref 98–107)
Co2: 27 mmol/L (ref 21–32)
Creatinine: 1.68 mg/dL — ABNORMAL HIGH (ref 0.60–1.30)
EGFR (African American): 46 — ABNORMAL LOW
EGFR (Non-African Amer.): 39 — ABNORMAL LOW
Glucose: 103 mg/dL — ABNORMAL HIGH (ref 65–99)
Osmolality: 279 (ref 275–301)
Potassium: 3.9 mmol/L (ref 3.5–5.1)
Sodium: 137 mmol/L (ref 136–145)

## 2013-10-21 LAB — TROPONIN I
Troponin-I: 0.09 ng/mL — ABNORMAL HIGH
Troponin-I: 0.09 ng/mL — ABNORMAL HIGH
Troponin-I: 0.09 ng/mL — ABNORMAL HIGH

## 2013-10-21 LAB — CBC
HCT: 45 % (ref 40.0–52.0)
HGB: 15.3 g/dL (ref 13.0–18.0)
MCH: 28.7 pg (ref 26.0–34.0)
MCHC: 34.1 g/dL (ref 32.0–36.0)
MCV: 84 fL (ref 80–100)
Platelet: 140 10*3/uL — ABNORMAL LOW (ref 150–440)
RBC: 5.35 10*6/uL (ref 4.40–5.90)
RDW: 14.9 % — ABNORMAL HIGH (ref 11.5–14.5)
WBC: 5 10*3/uL (ref 3.8–10.6)

## 2013-10-21 LAB — PRO B NATRIURETIC PEPTIDE: B-Type Natriuretic Peptide: 1543 pg/mL — ABNORMAL HIGH (ref 0–125)

## 2013-10-21 LAB — CK TOTAL AND CKMB (NOT AT ARMC)
CK, Total: 1263 U/L — ABNORMAL HIGH (ref 35–232)
CK-MB: 6.1 ng/mL — ABNORMAL HIGH (ref 0.5–3.6)

## 2013-10-21 LAB — CK-MB
CK-MB: 4.8 ng/mL — ABNORMAL HIGH (ref 0.5–3.6)
CK-MB: 5.1 ng/mL — ABNORMAL HIGH

## 2013-10-21 IMAGING — CR DG CHEST 2V
1 series · 2 of 2 positions shown · non-contrast
Comparison: None.

CLINICAL DATA: Cough and shortness of Breath

EXAM:
CHEST  2 VIEW

[Series 1: w chest pa · 0.14mm/px · 2 of 2 slices shown]
[im 1/2]
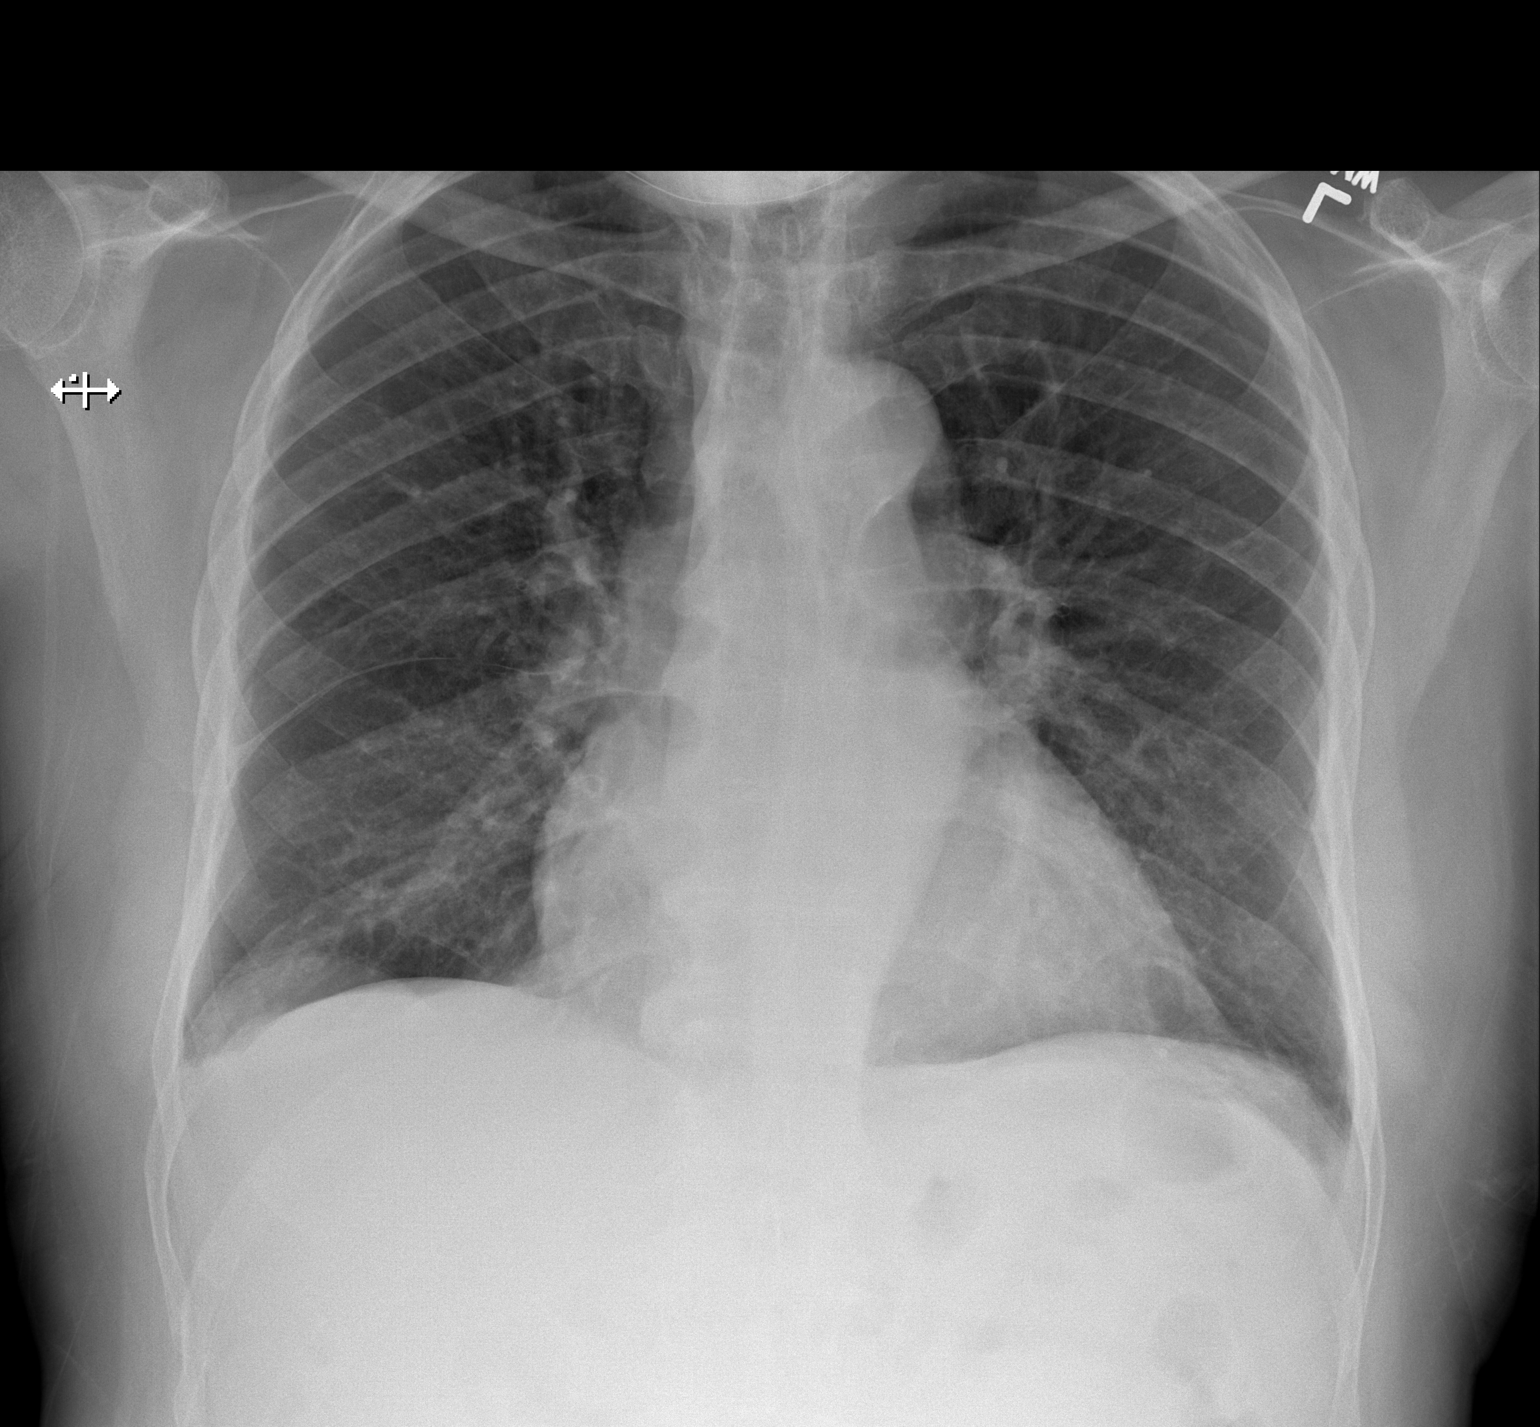
[im 2/2]
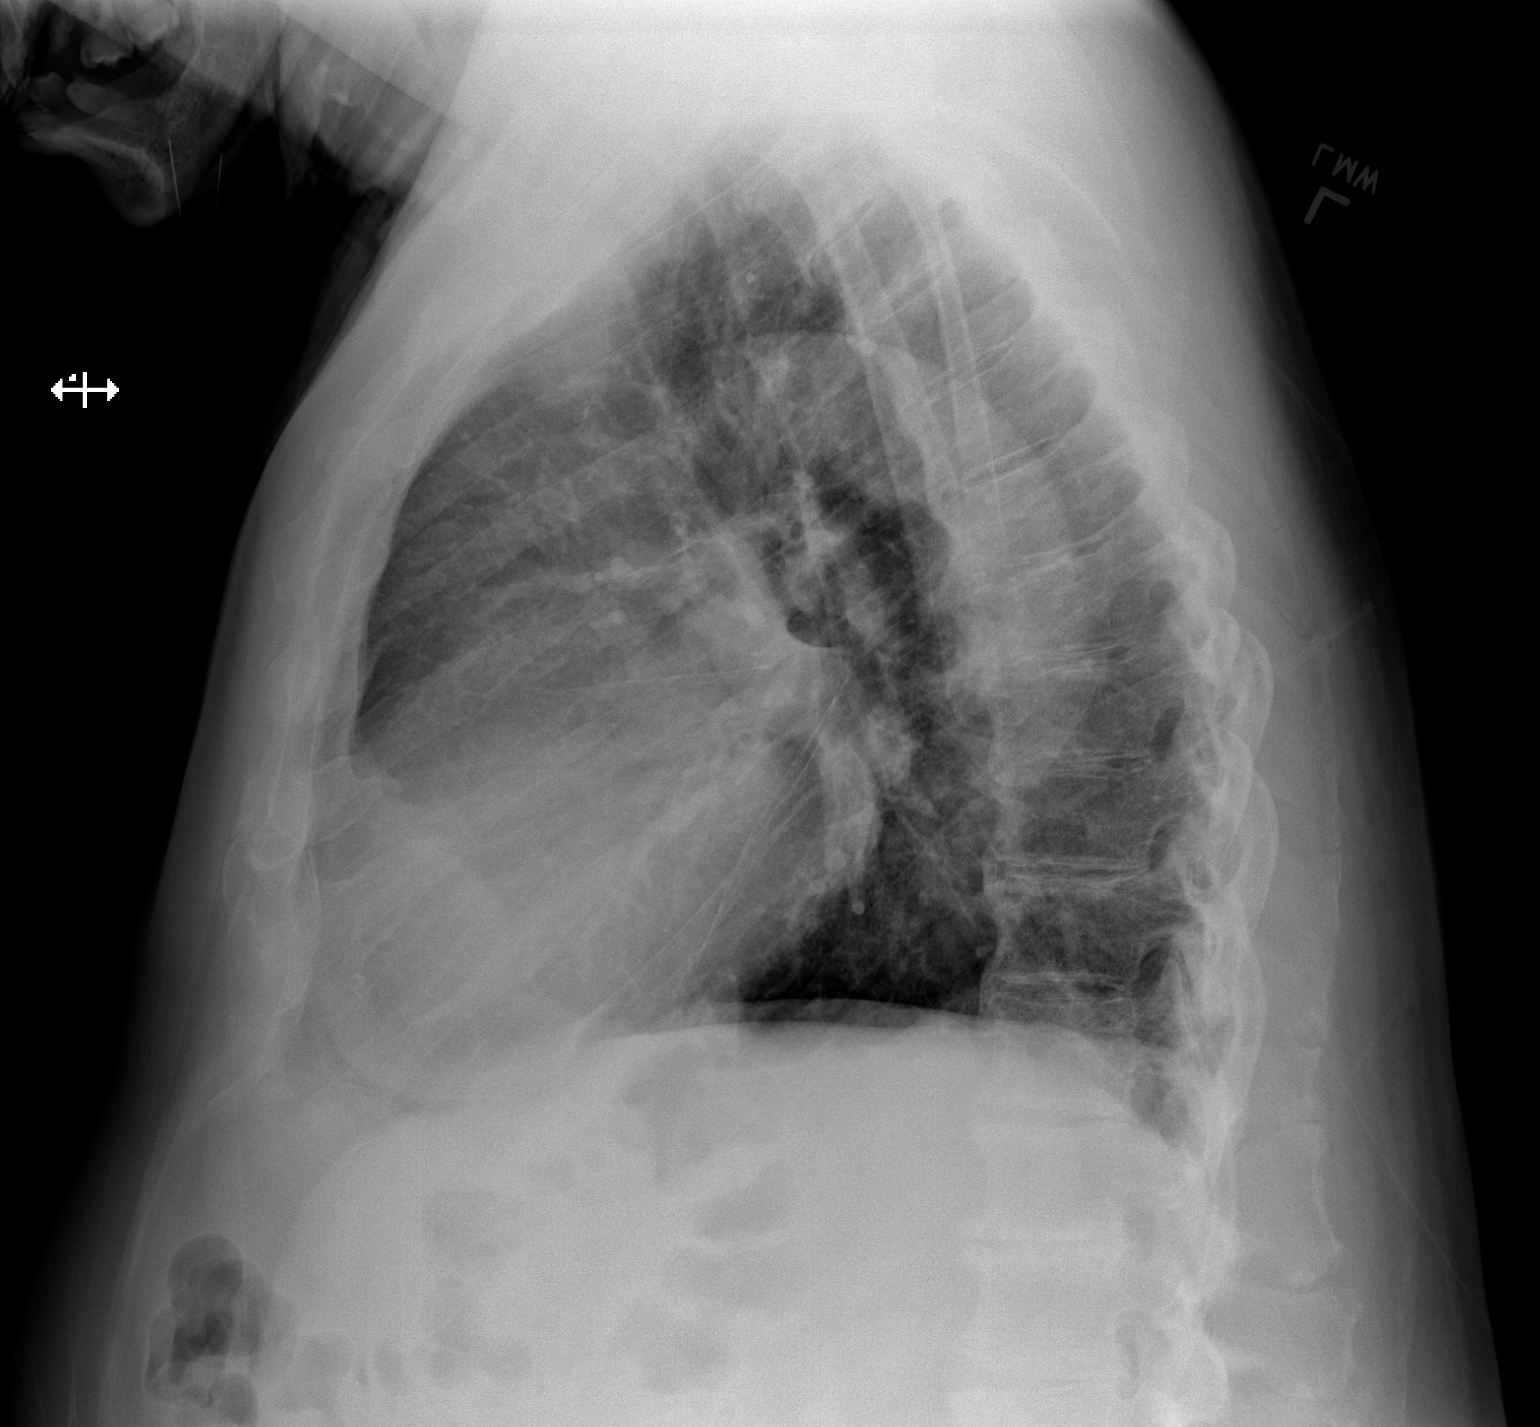

[2 of 2 positions shown; findings below may reference images not displayed]

FINDINGS: Mild cardiac enlargement. The thoracic aorta appears tortuous. Both
lungs are clear. Spondylosis noted within the thoracic spine.
IMPRESSION: No active cardiopulmonary disease.

## 2013-10-22 LAB — CBC WITH DIFFERENTIAL/PLATELET
Basophil #: 0 10*3/uL (ref 0.0–0.1)
Basophil %: 0.1 %
Eosinophil #: 0 10*3/uL (ref 0.0–0.7)
Eosinophil %: 0 %
HCT: 43.2 % (ref 40.0–52.0)
HGB: 14.8 g/dL (ref 13.0–18.0)
Lymphocyte #: 0.8 10*3/uL — ABNORMAL LOW (ref 1.0–3.6)
Lymphocyte %: 11.7 %
MCH: 28.6 pg (ref 26.0–34.0)
MCHC: 34.3 g/dL (ref 32.0–36.0)
MCV: 83 fL (ref 80–100)
Monocyte #: 0.6 x10 3/mm (ref 0.2–1.0)
Monocyte %: 8.2 %
Neutrophil #: 5.5 10*3/uL (ref 1.4–6.5)
Neutrophil %: 80 %
Platelet: 142 10*3/uL — ABNORMAL LOW (ref 150–440)
RBC: 5.19 10*6/uL (ref 4.40–5.90)
RDW: 14.7 % — ABNORMAL HIGH (ref 11.5–14.5)
WBC: 6.9 10*3/uL (ref 3.8–10.6)

## 2013-10-22 LAB — BASIC METABOLIC PANEL
Anion Gap: 7 (ref 7–16)
BUN: 24 mg/dL — ABNORMAL HIGH (ref 7–18)
Calcium, Total: 8.6 mg/dL (ref 8.5–10.1)
Chloride: 105 mmol/L (ref 98–107)
Co2: 25 mmol/L (ref 21–32)
Creatinine: 1.47 mg/dL — ABNORMAL HIGH (ref 0.60–1.30)
EGFR (African American): 54 — ABNORMAL LOW
EGFR (Non-African Amer.): 46 — ABNORMAL LOW
Glucose: 144 mg/dL — ABNORMAL HIGH (ref 65–99)
Osmolality: 280 (ref 275–301)
Potassium: 4 mmol/L (ref 3.5–5.1)
Sodium: 137 mmol/L (ref 136–145)

## 2014-05-11 ENCOUNTER — Emergency Department: Payer: Self-pay | Admitting: Internal Medicine

## 2015-02-16 NOTE — Discharge Summary (Signed)
PATIENT NAME:  George Bolton, George Bolton MR#:  [id number] DATE OF BIRTH:  [birth year]  DATE OF ADMISSION:  10/21/2013 DATE OF DISCHARGE:  10/22/2013  DISCHARGE DIAGNOSES: 1.  Acute renal failure.  2.  Rhabdomyolysis.  3.  Chest pain secondary to bronchitis. The patient has pleuritic chest pain with bronchitis.  4.  Other diagnoses include hypertension, hyperlipidemia, history of diastolic heart failure.   DISCHARGE MEDICATIONS:  Hydralazine 25 mg p.o. t.i.d., felodipine 10 mg p.o. daily, atorvastatin 40 mg p.o. daily, atenolol 50 mg p.o. daily, aspirin 81 mg daily, erythromycin 500 mg p.o. daily for 3 days. The patient advised to stop Lasix because of recent renal failure.   FOLLOWUP:  The patient needs to follow up on kidney function with Dr. Brynda Greathouse or Dr. Denton Lank.  The patient said that he also sees Dr. Brynda Greathouse.   HOSPITAL COURSE: This is a 74 year old African American with hypertension,  hyperlipidemia, came in because of dry cough and chest pain. The patient's creatinine was 1.69. Troponin was 0.09. The patient admitted to hospitalist service on telemetry for chest pain, acute renal failure, rhabdomyolysis. The patient CK elevated to 1263 ,with [id number] on admission. 5. The patient received IV fluids. Troponins have been negative at 0.09. Did not have any chest pain. The patient did have some cough and he wanted erythromycin for that. He does not want any steroids or inhalers. The patient says he feels much better today.  He received IV fluids. The patient's renal failure improved. Creatinine this morning 1.47, BUN of 24.  When he was admitted, his BUN 26 and creatinine to 1.68.  Advised to hold the Lasix for some time.  He does not have anymore edema. He can see Dr. Brynda Greathouse, follow up with him. The patient's blood pressure this morning is 146/99, temperature is 98.4, sats 96% on room air and heart rate is 89. The patient's condition is stable at discharge. Again, he did receive IV fluids.  He also received  Solu-Medrol. The patient does not want steroids or inhalers to go home.   TIME SPENT: More than 30 minutes.    ____________________________ Epifanio Lesches, MD sk:cs D: 10/22/2013 09:29:41 ET T: 10/22/2013 18:47:40 ET JOB#: [id number]  cc: Epifanio Lesches, MD, <Dictator> Epifanio Lesches MD ELECTRONICALLY SIGNED 10/29/2013 17:12

## 2015-02-16 NOTE — H&P (Signed)
PATIENT NAME:  George Bolton, George Bolton MR#:  [id number] DATE OF BIRTH:  [birth year]  DATE OF ADMISSION:  10/21/2013  PRIMARY CARE PHYSICIAN:  Dr. Denton Lank.   REFERRING PHYSICIAN:  Dr. Conni Slipper.   CHIEF COMPLAINT:  Cough, chest pain.   HISTORY OF PRESENT ILLNESS:  George Bolton is a 74 year old African American male with a past medical history of hypertension, congestive heart failure, hyperlipidemia, presented to the Emergency Department with complaints of cough, dry, continuous.  Also has been experiencing some chest pain.  Denies having any shortness of breath, increased lower extremity swelling.  Denies having any PND, orthopnea.  Work-up in the Emergency Department, the patient has mildly elevation of the troponin 0.09.  The patient has a renal insufficiency with a creatinine of 1.69.  The second set of cardiac enzymes also showed the similar in number 0.09.  The patient has elevated CK of 1260 and a CK-MB of 6.5.   PAST MEDICAL HISTORY: 1.  Hypertension.  2.  Hyperlipidemia.  3.  Congestive heart failure, diastolic.  4.  History of rheumatic fever.  5.  Atrial fibrillation.   ALLERGIES:  No known drug allergies.   HOME MEDICATIONS: 1.  Lasix 40 mg 2 times a day.  2.  Hydralazine 25 mg three times daily.  3.  Felodipine 10 mg once a day.  4.  Atorvastatin 40 mg once a day.  5.  Atenolol 50 mg once a day.  6.  Aspirin 81 mg once a day.   SOCIAL HISTORY:  Previous history of former smoker.  Denies drinking alcohol or using illicit drugs.  Married, lives with his wife.   FAMILY HISTORY:  History of hypertension and diabetes mellitus.   REVIEW OF SYSTEMS: CONSTITUTIONAL:  Denies any generalized weakness.  EYES:  No change in vision.  EARS, NOSE, THROAT:  No change in hearing.  Has mild sore throat.  No runny nose.  RESPIRATORY:  Has cough.  No productive sputum.  No shortness of breath.  CARDIOVASCULAR:  Has chest pain, mostly musculoskeletal.  GASTROINTESTINAL:  No nausea,  vomiting, abdominal pain.  GENITOURINARY:  No dysuria or hematuria.  ENDOCRINE:  No polyuria or polydipsia.  HEMATOLOGIC:  No easy bruising or bleeding.  SKIN:  No rash or lesions.  MUSCULOSKELETAL:  No joint pains and aches.  NEUROLOGIC:  No weakness or numbness in any part of the body.   PHYSICAL EXAMINATION: GENERAL:  This is a well-built, well-nourished, age-appropriate male lying down in the bed, not in distress.  VITAL SIGNS:  Temperature 96.9, pulse 94, blood pressure 138/85, respiratory rate of 18, oxygen saturation is 98% on room air.  HEENT:  Head normocephalic, atraumatic.  Eyes, no scleral icterus.  Conjunctivae normal.  Pupils equal and react to light.  Extraocular movements are intact.  Mucous membranes moist.  No pharyngeal erythema. NECK:  Supple.  No lymphadenopathy.  No JVD.  No carotid bruit.  CHEST:  Has no focal tenderness. LUNGS:  Mildly prolonged expiratory phase.  HEART:  S1 and S2 regular.  No murmurs are heard.  ABDOMEN:  Bowel sounds plus.  Soft, nontender, nondistended.  No hepatosplenomegaly.  EXTREMITIES:  No pedal edema.  Pulses 2+.  NEUROLOGIC:  The patient is alert, oriented to place, person and time.  Cranial nerves II through XII intact.  Motor 5 by 5 in upper and lower extremities.  SKIN:  No rash or lesions.  MUSCULOSKELETAL:  Good range of motion all the extremities.  LABORATORY DATA:  Troponin 0.9, CK  1263, CK-MB of 6.1.  BNP 1543, BUN 26, creatinine of 1.68.  CBC is completely within normal limits.   ASSESSMENT AND PLAN:  George Bolton is a 74 year old male who comes to the Emergency Department with chest pain, cough.  1.  Chest pain.  This seems to be more of a musculoskeletal pain from coughing.  However, considering the patient's history and age, we will continue to cycle cardiac enzymes x 3.  If negative no further work-up is needed.  2.  Rhabdomyolysis from the coughing.  Continue with IV fluids and follow up.  3.  Acute on chronic renal  insufficiency.  Continue with IV fluids and follow up with BMP.  4.  Cough, most likely secondary to bronchitis.  Keep the patient on DuoNebs and prednisone.  No obvious source of infection is found.  No indication for the antibiotics.  5.  Debility.  We will involve the physical therapy.  6.  Keep the patient on deep vein thrombosis prophylaxis with Lovenox.   TIME SPENT:  45 minutes.    ____________________________ Monica Becton, MD pv:ea D: 10/21/2013 03:46:27 ET T: 10/21/2013 06:52:54 ET JOB#: [id number]  cc: Monica Becton, MD, <Dictator> Sarah "Sallie" Posey Pronto, MD Monica Becton MD ELECTRONICALLY SIGNED 11/03/2013 21:16

## 2015-04-25 DIAGNOSIS — I10 Essential (primary) hypertension: Secondary | ICD-10-CM | POA: Insufficient documentation

## 2015-04-25 DIAGNOSIS — I509 Heart failure, unspecified: Secondary | ICD-10-CM | POA: Insufficient documentation

## 2016-02-09 ENCOUNTER — Encounter: Payer: Self-pay | Admitting: Emergency Medicine

## 2016-02-09 ENCOUNTER — Emergency Department
Admission: EM | Admit: 2016-02-09 | Discharge: 2016-02-09 | Disposition: A | Discharge status: Home / Self Care | Payer: Medicare Other | Attending: Emergency Medicine | Admitting: Emergency Medicine

## 2016-02-09 DIAGNOSIS — M109 Gout, unspecified: Secondary | ICD-10-CM

## 2016-02-09 DIAGNOSIS — M79641 Pain in right hand: Secondary | ICD-10-CM | POA: Diagnosis present

## 2016-02-09 DIAGNOSIS — I10 Essential (primary) hypertension: Secondary | ICD-10-CM | POA: Diagnosis not present

## 2016-02-09 DIAGNOSIS — M10041 Idiopathic gout, right hand: Secondary | ICD-10-CM | POA: Insufficient documentation

## 2016-02-09 DIAGNOSIS — I509 Heart failure, unspecified: Secondary | ICD-10-CM | POA: Diagnosis not present

## 2016-02-09 HISTORY — DX: Essential (primary) hypertension: I10

## 2016-02-09 HISTORY — DX: Gout, unspecified: M10.9

## 2016-02-09 LAB — BASIC METABOLIC PANEL
Anion gap: 9 (ref 5–15)
BUN: 25 mg/dL — ABNORMAL HIGH (ref 6–20)
CO2: 24 mmol/L (ref 22–32)
Calcium: 9.3 mg/dL (ref 8.9–10.3)
Chloride: 104 mmol/L (ref 101–111)
Creatinine, Ser: 1.48 mg/dL — ABNORMAL HIGH (ref 0.61–1.24)
GFR calc Af Amer: 51 mL/min — ABNORMAL LOW (ref >60)
GFR calc non Af Amer: 44 mL/min — ABNORMAL LOW (ref >60)
Glucose, Bld: 133 mg/dL — ABNORMAL HIGH (ref 65–99)
Potassium: 4 mmol/L (ref 3.5–5.1)
Sodium: 137 mmol/L (ref 135–145)

## 2016-02-09 LAB — CBC WITH DIFFERENTIAL/PLATELET
Basophils Absolute: 0.1 10*3/uL (ref 0–0.1)
Basophils Relative: 1 %
Eosinophils Absolute: 0.2 10*3/uL (ref 0–0.7)
Eosinophils Relative: 3 %
HCT: 45.8 % (ref 40.0–52.0)
Hemoglobin: 15.6 g/dL (ref 13.0–18.0)
Lymphocytes Relative: 17 %
Lymphs Abs: 1.4 10*3/uL (ref 1.0–3.6)
MCH: 28.8 pg (ref 26.0–34.0)
MCHC: 34 g/dL (ref 32.0–36.0)
MCV: 84.6 fL (ref 80.0–100.0)
Monocytes Absolute: 0.9 10*3/uL (ref 0.2–1.0)
Monocytes Relative: 11 %
Neutro Abs: 5.5 10*3/uL (ref 1.4–6.5)
Neutrophils Relative %: 68 %
Platelets: 204 10*3/uL (ref 150–440)
RBC: 5.42 MIL/uL (ref 4.40–5.90)
RDW: 16.1 % — ABNORMAL HIGH (ref 11.5–14.5)
WBC: 8.1 10*3/uL (ref 3.8–10.6)

## 2016-02-09 LAB — URIC ACID: Uric Acid, Serum: 9.3 mg/dL — ABNORMAL HIGH (ref 4.4–7.6)

## 2016-02-09 MED ORDER — OXYCODONE-ACETAMINOPHEN 5-325 MG PO TABS: 1.0000 | ORAL_TABLET | Freq: Four times a day (QID) | ORAL | Status: DC | PRN

## 2016-02-09 MED ORDER — PREDNISONE 50 MG PO TABS: 50.0000 mg | ORAL_TABLET | Freq: Every day | ORAL | Status: DC

## 2016-02-09 MED ORDER — KETOROLAC TROMETHAMINE 30 MG/ML IJ SOLN
30.0000 mg | Freq: Once | INTRAMUSCULAR | Status: AC
  Administered 2016-02-09: 30 mg via INTRAMUSCULAR
  Filled 2016-02-09: qty 1

## 2016-02-09 NOTE — ED Notes (Signed)
Dr. Corky Downs at bedside.   Pt states 7-8 days with R hand swollen, no redness noted. Pt states "I know it's gout". R hand tender. Hx of gout. States "Percocet" helped in the past, given by [facility]. Has also seen Princella Ion. Family at bedside.

## 2016-02-09 NOTE — ED Notes (Signed)
Rt hand swelling with hx of gout - states prednisone didn't help this time.

## 2016-02-09 NOTE — ED Notes (Signed)
Hasn't taken lasix for 3 days

## 2016-02-09 NOTE — ED Provider Notes (Signed)
[facility] Emergency Department Provider Note  ____________________________________________    I have reviewed the triage vital signs and the nursing notes.   HISTORY  Chief Complaint Hand Pain    HPI George Bolton is a 75 y.o. male who presents with complaints of a swollen painful right hand. Patient chooses to gout and says he has had this numerous times in the past. He gets better quickly but this has lasted longer than usual. He denies fevers or chills. No erythema. He denies shortness of breath.     Past Medical History  Diagnosis Date  . Hypertension   . Gout   . Irregular heart beat   . CHF (congestive heart failure) ([place])     There are no active problems to display for this patient.   History reviewed. No pertinent past surgical history.  Current Outpatient Rx  Name  Route  Sig  Dispense  Refill  . oxyCODONE-acetaminophen (ROXICET) 5-325 MG tablet   Oral   Take 1 tablet by mouth every 6 (six) hours as needed.   20 tablet   0   . predniSONE (DELTASONE) 50 MG tablet   Oral   Take 1 tablet (50 mg total) by mouth daily with breakfast.   3 tablet   0     Allergies Cortisone  History reviewed. No pertinent family history.  Social History Social History  Substance Use Topics  . Smoking status: Never Smoker   . Smokeless tobacco: None  . Alcohol Use: No    Review of Systems  Constitutional: Negative for fever.  Cardiovascular: Negative for chest pain Respiratory: Negative for shortness of breath.  Genitourinary: Negative for dysuria. Musculoskeletal: Hand pain as above Skin: Negative for rash. Neurological: Negative for focal weakness Psychiatric: no anxiety    ____________________________________________   PHYSICAL EXAM:  VITAL SIGNS: ED Triage Vitals  Enc Vitals Group     BP 02/09/16 1143 177/111 mmHg     Pulse Rate 02/09/16 1143 114     Resp 02/09/16 1143 18     Temp 02/09/16 1143 98 F (36.7 C)      Temp src --      SpO2 02/09/16 1143 98 %     Weight 02/09/16 1143 204 lb (92.534 kg)     Height 02/09/16 1143 6\' 4"  (1.93 m)     Head Cir --      Peak Flow --      Pain Score 02/09/16 1144 10     Pain Loc --      Pain Edu? --      Excl. in [place]? --      Constitutional: Alert and oriented. Well appearing and in no distress. Pleasant and interactive Eyes: Conjunctivae are normal. No erythema or injection ENT   Head: Normocephalic and atraumatic.   Mouth/Throat: Mucous membranes are moist. Cardiovascular: Normal rate, regular rhythm. Normal and symmetric distal pulses are present in the upper extremities.  Respiratory: Normal respiratory effort without tachypnea nor retractions. Breath sounds are clear and equal bilaterally.  GMusculoskeletal: Patient with diffusely swollen right hand, painful with palpation anywhere. Normal cap refill. 2+ ulnar and radial pulses. No erythema or evidence of cellulitis or abscess Neurologic:  Normal speech and language. No gross focal neurologic deficits are appreciated. Skin:  Skin is warm, dry and intact. No rash noted. Psychiatric: Mood and affect are normal. Patient exhibits appropriate insight and judgment.  ____________________________________________    LABS (pertinent positives/negatives)  Labs Reviewed  CBC WITH DIFFERENTIAL/PLATELET -  Abnormal; Notable for the following:    RDW 16.1 (*)    All other components within normal limits  BASIC METABOLIC PANEL - Abnormal; Notable for the following:    Glucose, Bld 133 (*)    BUN 25 (*)    Creatinine, Ser 1.48 (*)    GFR calc non Af Amer 44 (*)    GFR calc Af Amer 51 (*)    All other components within normal limits  URIC ACID - Abnormal; Notable for the following:    Uric Acid, Serum 9.3 (*)    All other components within normal limits    ____________________________________________   EKG  None  ____________________________________________     RADIOLOGY  None  ____________________________________________   PROCEDURES  Procedure(s) performed: none  Critical Care performed: none  ____________________________________________   INITIAL IMPRESSION / ASSESSMENT AND PLAN / ED COURSE  Pertinent labs & imaging results that were available during my care of the patient were reviewed by me and considered in my medical decision making (see chart for details).  Patient in significant improvement pain after Toradol IM. Typically he has relief with Percocet and prednisone. We will try a course of these medications. He will follow-up with PCP in one to 2 days if no improvement.  ____________________________________________   FINAL CLINICAL IMPRESSION(S) / ED DIAGNOSES  Final diagnoses:  Acute gout of right hand, unspecified cause          Lavonia Drafts, MD 02/09/16 1554

## 2016-02-09 NOTE — Discharge Instructions (Signed)

## 2016-06-08 IMAGING — US US ABDOMEN LIMITED
1 series · 14 of 25 positions shown · non-contrast
Comparison: CT abdomen and pelvis [DATE]

CLINICAL DATA: Three-day history abdominal pain

EXAM:
US ABDOMEN LIMITED - RIGHT UPPER QUADRANT

[Series 1: us abdomen limited · 0.23mm/px · 14 of 53 slices shown]
[im 1/53]
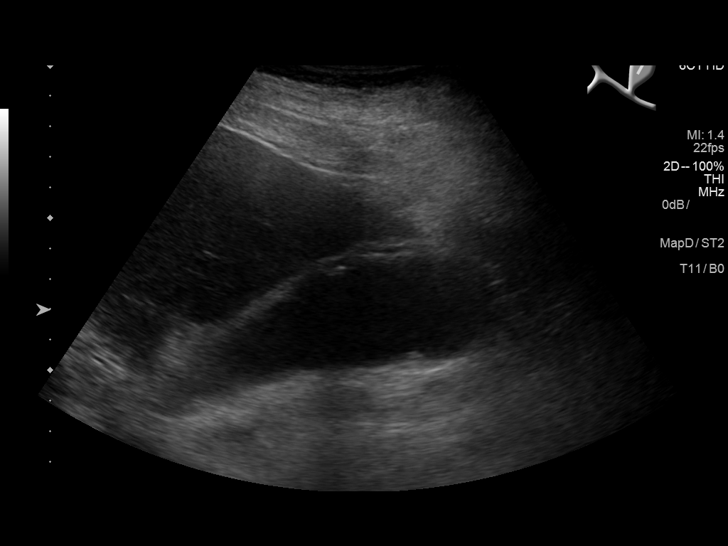
[im 5/53]
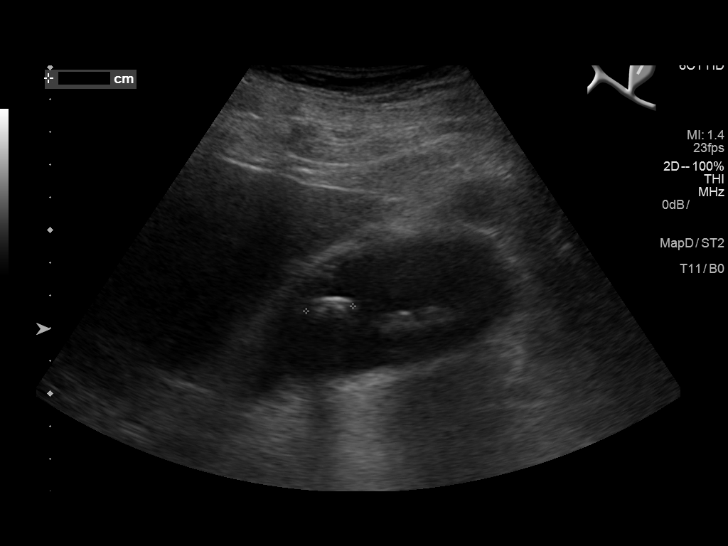
[im 9/53]
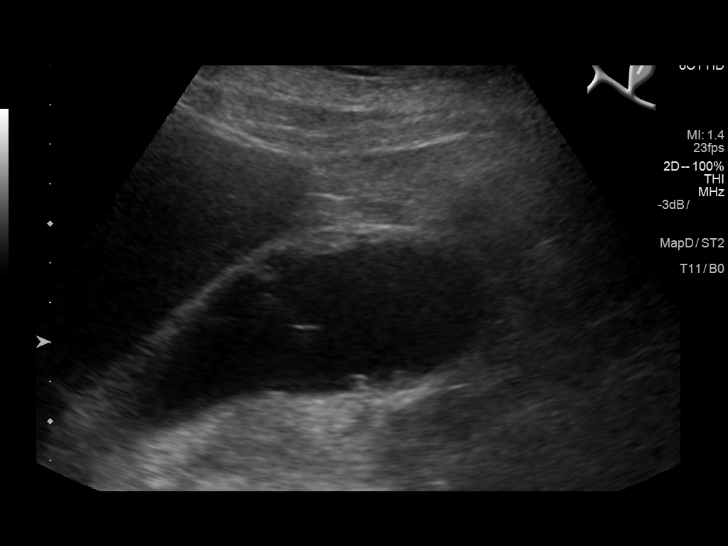
[im 14/53]
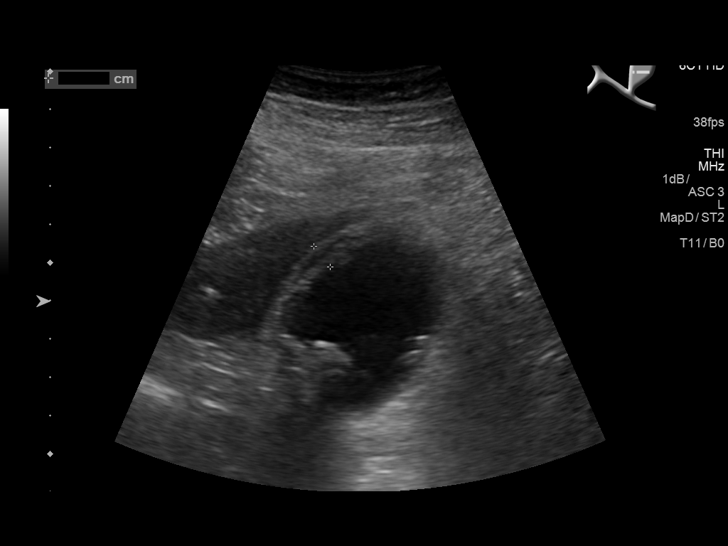
[im 18/53]
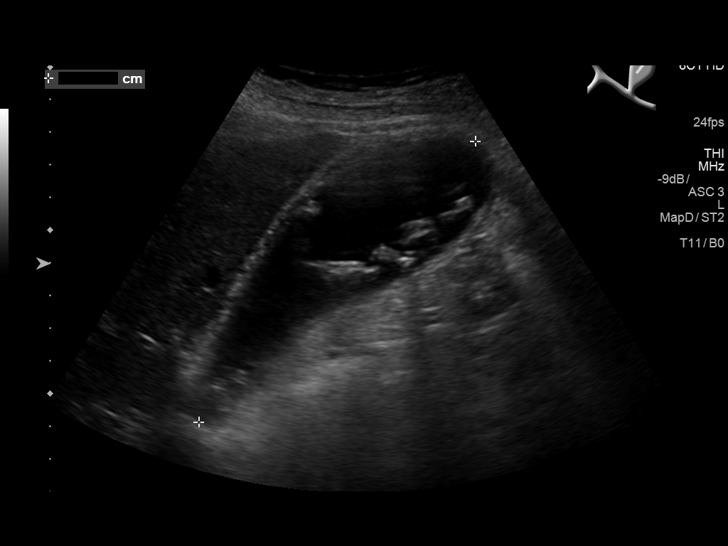
[im 20/53]
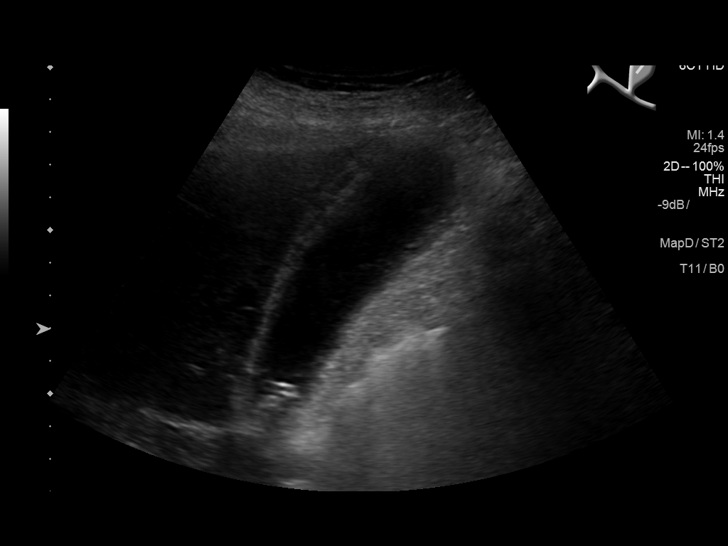
[im 24/53]
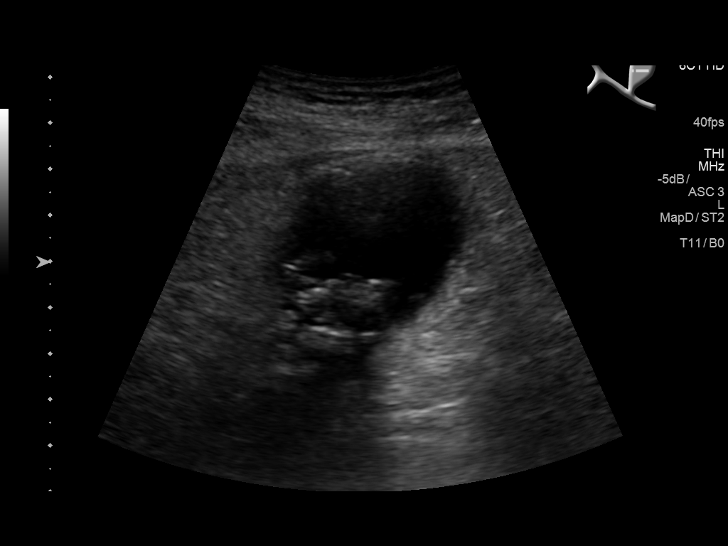
[im 29/53]
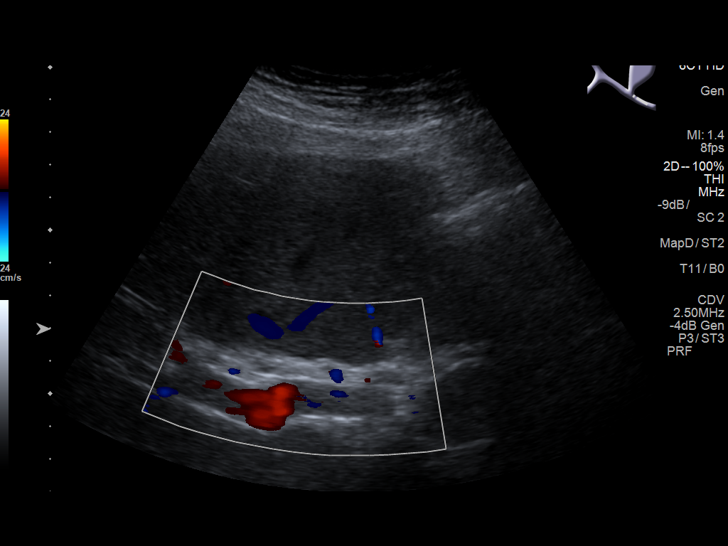
[im 33/53]
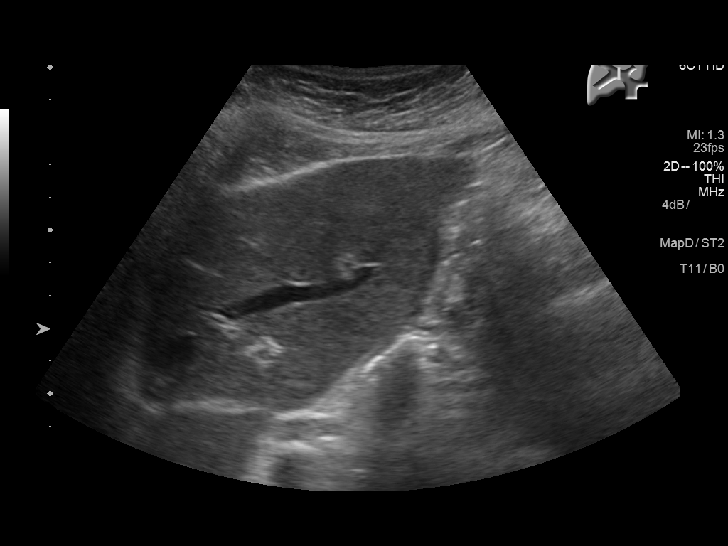
[im 35/53]
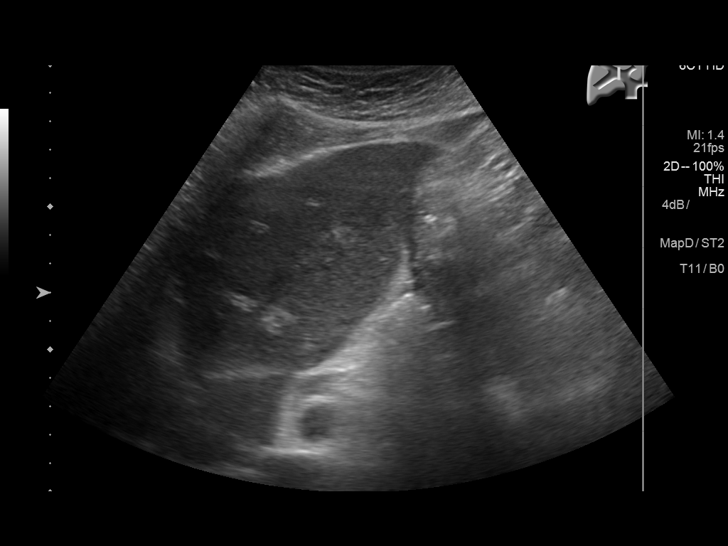
[im 40/53]
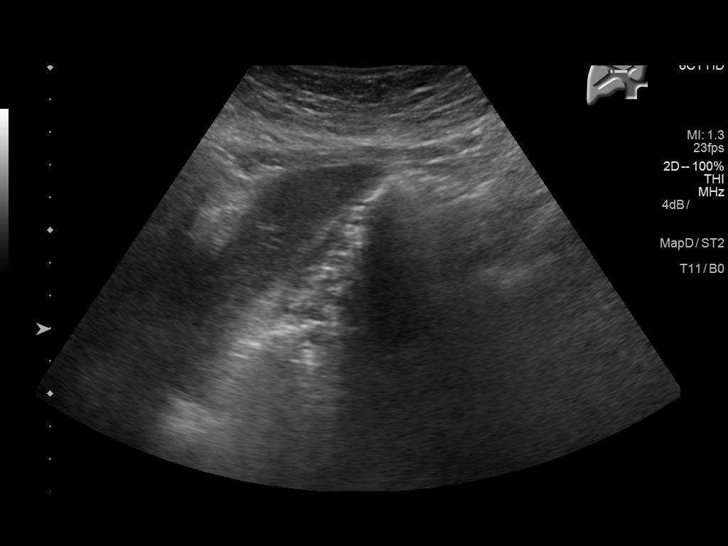
[im 44/53]
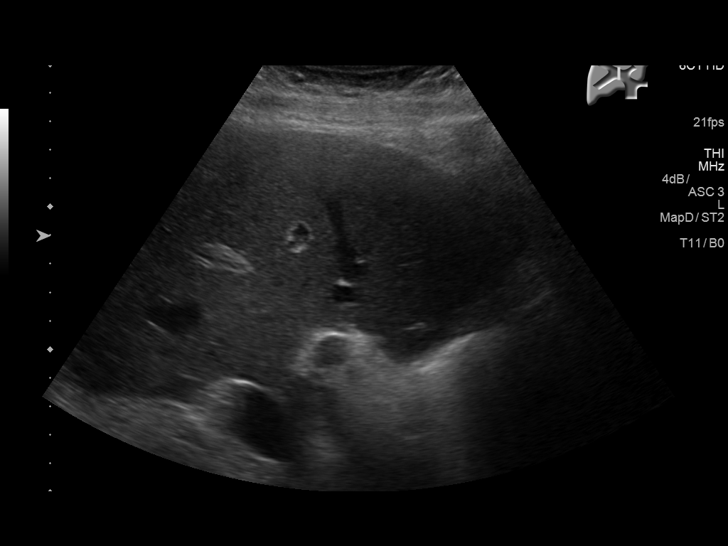
[im 48/53]
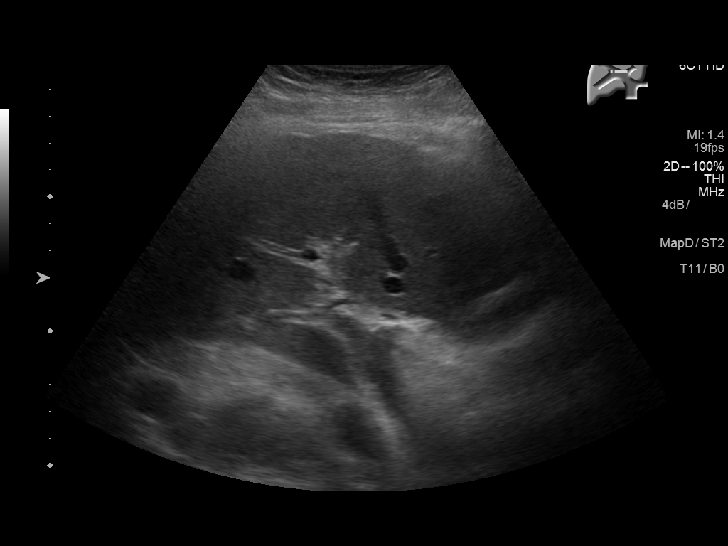
[im 53/53]
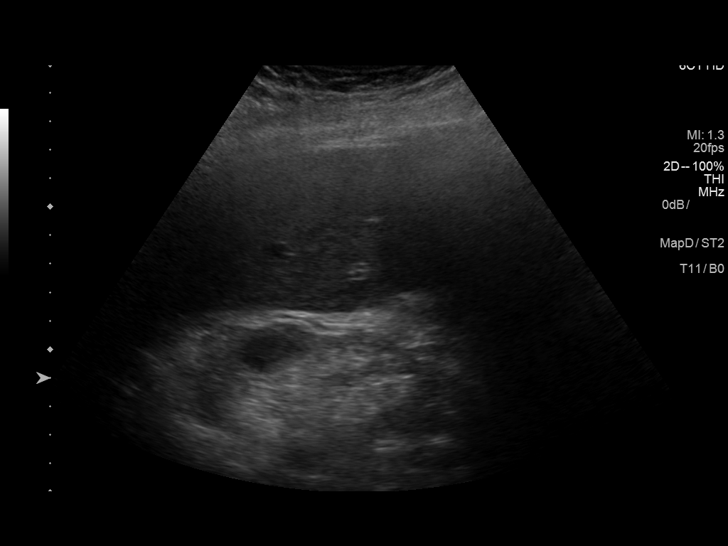

[14 of 25 positions shown; findings below may reference images not displayed]

FINDINGS: Gallbladder:

Within the gallbladder, there are multiple echogenic foci which move
and shadow consistent with gallstones. Largest gallstone measures
1.4 cm in length. Gallbladder wall is thickened and edematous with
pericholecystic fluid. No sonographic Murphy sign noted by
sonographer.

Common bile duct:

Diameter: 4 mm. No intrahepatic or extrahepatic biliary duct
dilatation.

Liver:

No focal lesion identified. Within normal limits in parenchymal
echogenicity.
IMPRESSION: Findings as described above consistent with acute cholecystitis.

## 2016-06-09 IMAGING — US US EXTREM LOW VENOUS BILAT
1 series · 13 of 24 positions shown · non-contrast
Comparison: None.

CLINICAL DATA: Left lower extremity edema



[Series 1: us extrem low venous bilat · 0.08mm/px · 13 of 60 slices shown]
[im 1/60]
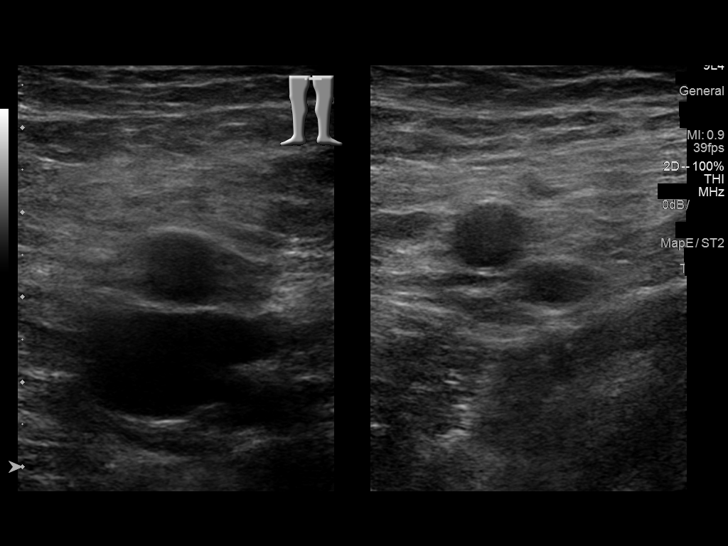
[im 6/60]
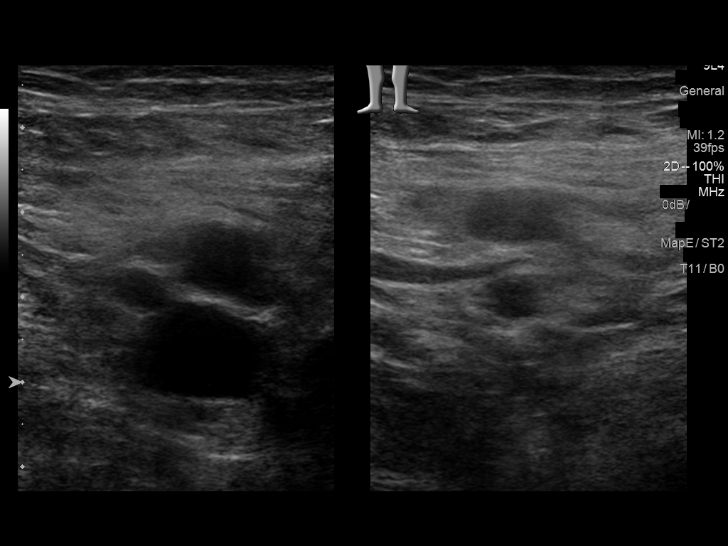
[im 11/60]
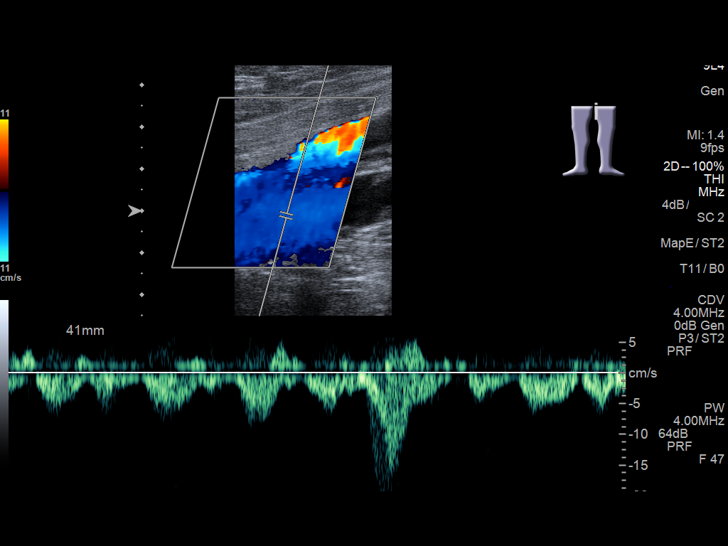
[im 16/60]
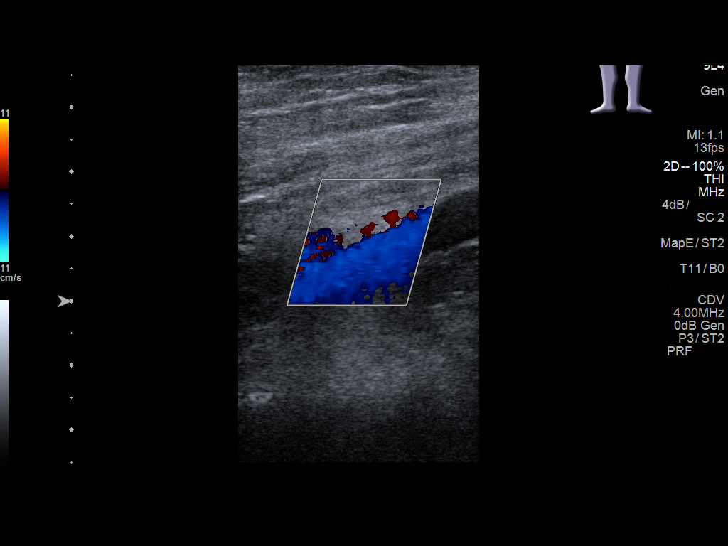
[im 21/60]
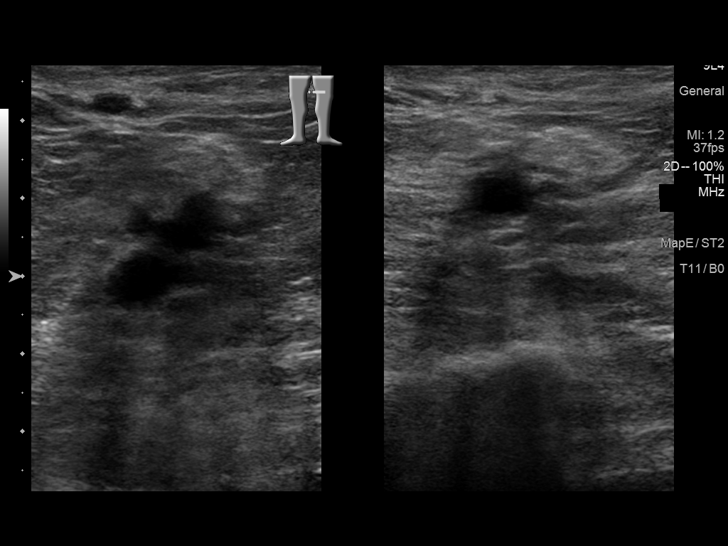
[im 26/60]
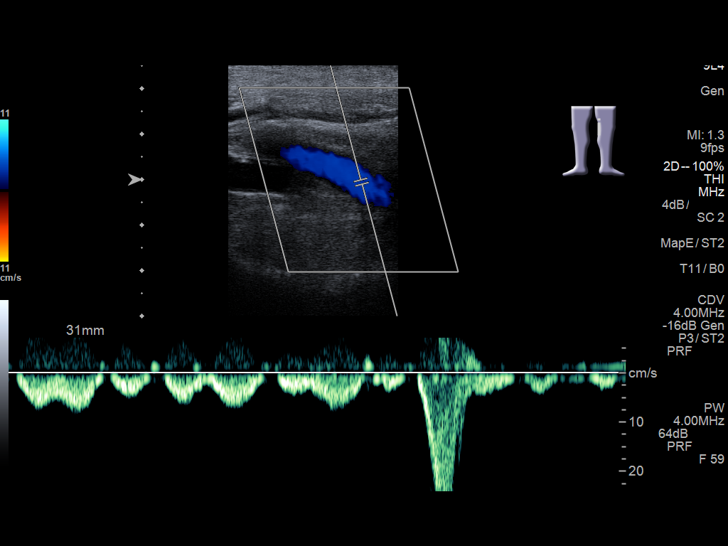
[im 31/60]
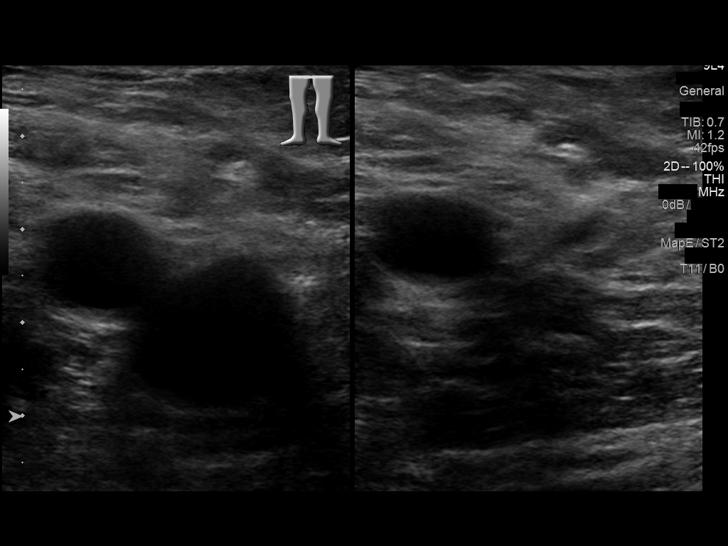
[im 34/60]
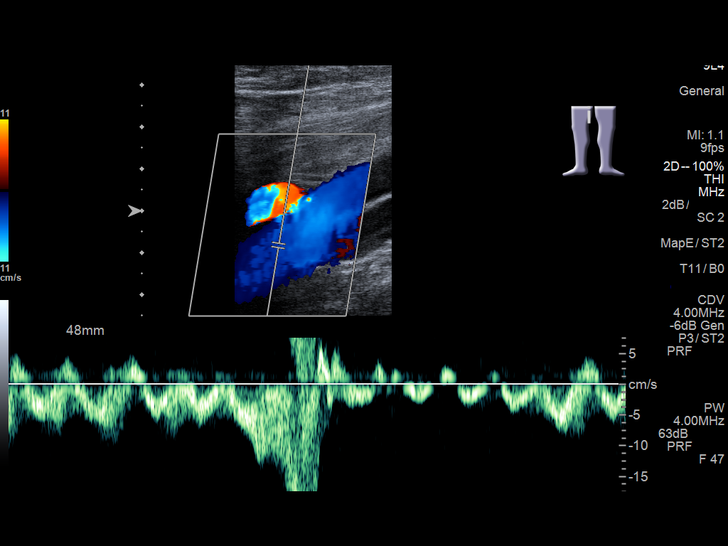
[im 39/60]
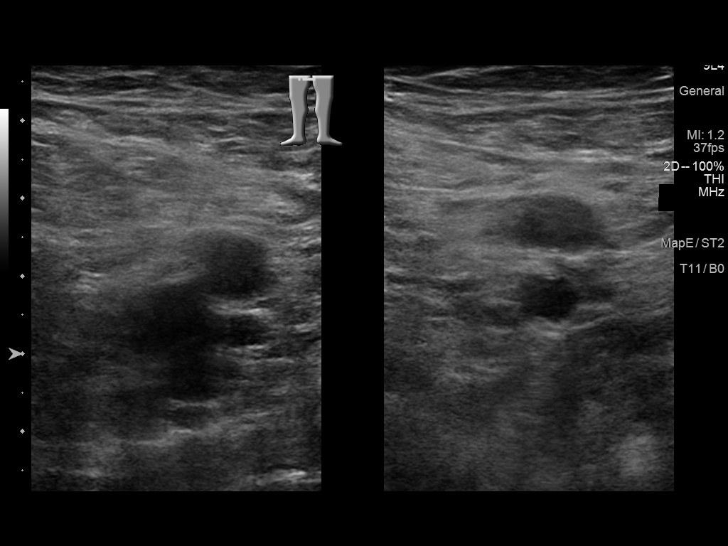
[im 44/60]
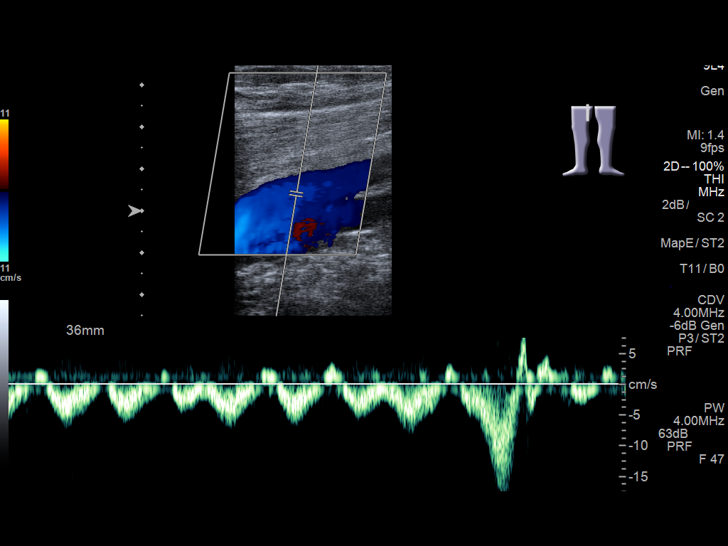
[im 49/60]
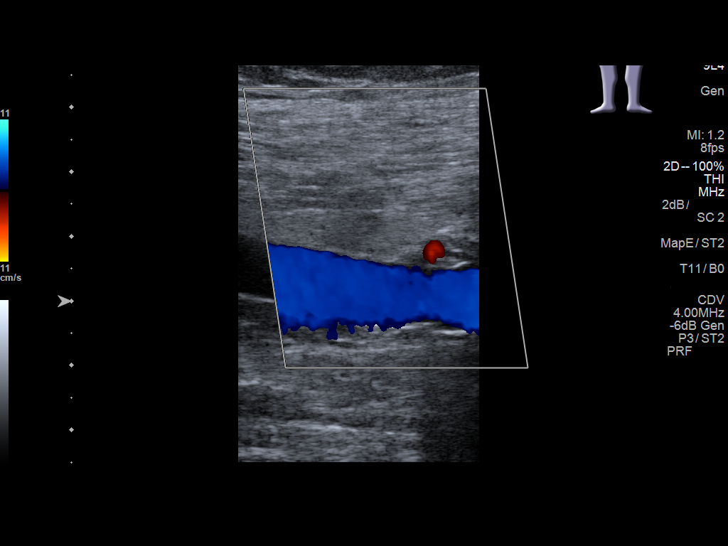
[im 54/60]
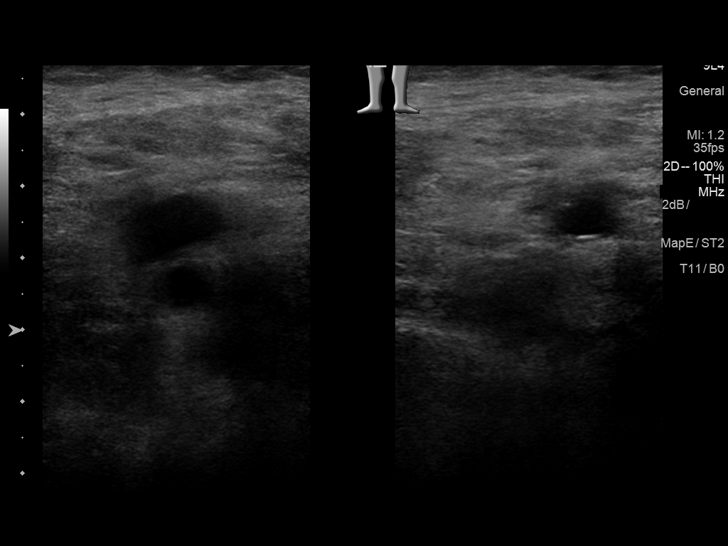
[im 60/60]
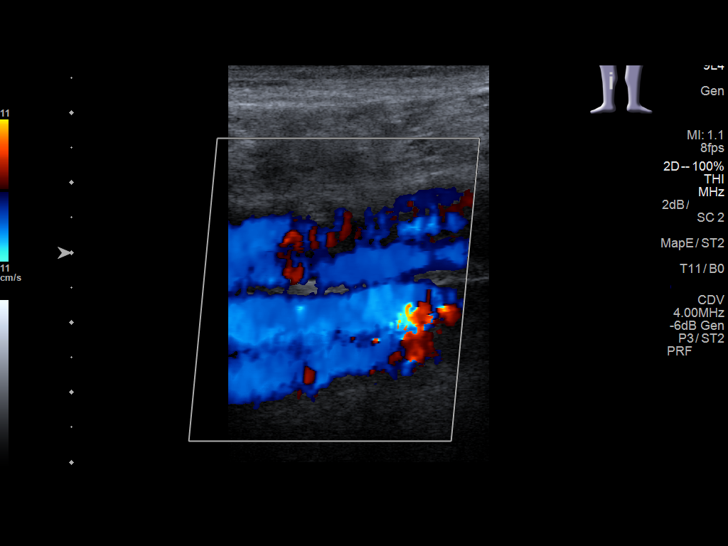

[13 of 24 positions shown; findings below may reference images not displayed]

FINDINGS: RIGHT LOWER EXTREMITY

Common Femoral Vein: No evidence of thrombus. Normal
compressibility, respiratory phasicity and response to augmentation.

Saphenofemoral Junction: No evidence of thrombus. Normal
compressibility and flow on color Doppler imaging.

Profunda Femoral Vein: No evidence of thrombus. Normal
compressibility and flow on color Doppler imaging.

Femoral Vein: No evidence of thrombus. Normal compressibility,
respiratory phasicity and response to augmentation.

Popliteal Vein: No evidence of thrombus. Normal compressibility,
respiratory phasicity and response to augmentation.

Calf Veins: No evidence of thrombus. Normal compressibility and flow
on color Doppler imaging.

Superficial Great Saphenous Vein: No evidence of thrombus. Normal
compressibility and flow on color Doppler imaging.

Venous Reflux:  None.

Other Findings:  None.

LEFT LOWER EXTREMITY

Common Femoral Vein: No evidence of thrombus. Normal
compressibility, respiratory phasicity and response to augmentation.

Saphenofemoral Junction: No evidence of thrombus. Normal
compressibility and flow on color Doppler imaging.

Profunda Femoral Vein: No evidence of thrombus. Normal
compressibility and flow on color Doppler imaging.

Femoral Vein: No evidence of thrombus. Normal compressibility,
respiratory phasicity and response to augmentation.

Popliteal Vein: No evidence of thrombus. Normal compressibility,
respiratory phasicity and response to augmentation.

Calf Veins: No evidence of thrombus. Normal compressibility and flow
on color Doppler imaging.

Superficial Great Saphenous Vein: No evidence of thrombus. Normal
compressibility and flow on color Doppler imaging.

Venous Reflux:  None.

Other Findings:  None.
IMPRESSION: No evidence of deep venous thrombosis.

## 2016-06-29 ENCOUNTER — Emergency Department
Admission: EM | Admit: 2016-06-29 | Discharge: 2016-06-29 | Disposition: A | Discharge status: Home / Self Care | Payer: Medicare Other | Attending: Emergency Medicine | Admitting: Emergency Medicine

## 2016-06-29 DIAGNOSIS — M109 Gout, unspecified: Secondary | ICD-10-CM | POA: Diagnosis not present

## 2016-06-29 DIAGNOSIS — I509 Heart failure, unspecified: Secondary | ICD-10-CM | POA: Insufficient documentation

## 2016-06-29 DIAGNOSIS — I110 Hypertensive heart disease with heart failure: Secondary | ICD-10-CM | POA: Insufficient documentation

## 2016-06-29 DIAGNOSIS — M79605 Pain in left leg: Secondary | ICD-10-CM | POA: Diagnosis present

## 2016-06-29 LAB — CBC WITH DIFFERENTIAL/PLATELET
Basophils Absolute: 0.1 10*3/uL (ref 0–0.1)
Basophils Relative: 1 %
Eosinophils Absolute: 0 10*3/uL (ref 0–0.7)
Eosinophils Relative: 0 %
HCT: 47.5 % (ref 40.0–52.0)
Hemoglobin: 16.5 g/dL (ref 13.0–18.0)
Lymphocytes Relative: 6 %
Lymphs Abs: 0.9 10*3/uL — ABNORMAL LOW (ref 1.0–3.6)
MCH: 29 pg (ref 26.0–34.0)
MCHC: 34.7 g/dL (ref 32.0–36.0)
MCV: 83.6 fL (ref 80.0–100.0)
Monocytes Absolute: 1.5 10*3/uL — ABNORMAL HIGH (ref 0.2–1.0)
Monocytes Relative: 10 %
Neutro Abs: 12.5 10*3/uL — ABNORMAL HIGH (ref 1.4–6.5)
Neutrophils Relative %: 83 %
Platelets: 167 10*3/uL (ref 150–440)
RBC: 5.68 MIL/uL (ref 4.40–5.90)
RDW: 15.2 % — ABNORMAL HIGH (ref 11.5–14.5)
WBC: 14.9 10*3/uL — ABNORMAL HIGH (ref 3.8–10.6)

## 2016-06-29 LAB — COMPREHENSIVE METABOLIC PANEL
ALT: 16 U/L — ABNORMAL LOW (ref 17–63)
AST: 23 U/L (ref 15–41)
Albumin: 4.2 g/dL (ref 3.5–5.0)
Alkaline Phosphatase: 81 U/L (ref 38–126)
Anion gap: 10 (ref 5–15)
BUN: 23 mg/dL — ABNORMAL HIGH (ref 6–20)
CO2: 23 mmol/L (ref 22–32)
Calcium: 9.6 mg/dL (ref 8.9–10.3)
Chloride: 103 mmol/L (ref 101–111)
Creatinine, Ser: 1.52 mg/dL — ABNORMAL HIGH (ref 0.61–1.24)
GFR calc Af Amer: 49 mL/min — ABNORMAL LOW (ref >60)
GFR calc non Af Amer: 42 mL/min — ABNORMAL LOW (ref >60)
Glucose, Bld: 146 mg/dL — ABNORMAL HIGH (ref 65–99)
Potassium: 3.6 mmol/L (ref 3.5–5.1)
Sodium: 136 mmol/L (ref 135–145)
Total Bilirubin: 1.9 mg/dL — ABNORMAL HIGH (ref 0.3–1.2)
Total Protein: 8.6 g/dL — ABNORMAL HIGH (ref 6.5–8.1)

## 2016-06-29 LAB — URIC ACID: Uric Acid, Serum: 7.7 mg/dL — ABNORMAL HIGH (ref 4.4–7.6)

## 2016-06-29 MED ORDER — PREDNISONE 20 MG PO TABS: 40.0000 mg | ORAL_TABLET | Freq: Every day | ORAL | 0 refills | Status: DC

## 2016-06-29 MED ORDER — OXYCODONE-ACETAMINOPHEN 7.5-325 MG PO TABS: 1.0000 | ORAL_TABLET | Freq: Four times a day (QID) | ORAL | 0 refills | Status: DC | PRN

## 2016-06-29 MED ORDER — KETOROLAC TROMETHAMINE 30 MG/ML IJ SOLN
30.0000 mg | Freq: Once | INTRAMUSCULAR | Status: AC
  Administered 2016-06-29: 30 mg via INTRAVENOUS
  Filled 2016-06-29: qty 1

## 2016-06-29 NOTE — ED Provider Notes (Signed)
Time Seen: Approximately 0927  I have reviewed the triage notes  Chief Complaint: Leg Pain and Gout   History of Present Illness: George Bolton is a 75 y.o. male who has a history of gouty arthritis. Patient states the location of his gout-like pain is in his knee on this particular occasion. He has had it in his left thumb along with other various joints in the lower extremities. He denies any knee trauma. He is not aware of any fever. He denies any significant swelling in the lower part of his leg and points mainly to the anterior surface of his knee. He did not take anything at home for pain. He has been able to ambulate.   Past Medical History:  Diagnosis Date  . CHF (congestive heart failure) ([place])   . Gout   . Hypertension   . Irregular heart beat     There are no active problems to display for this patient.   No past surgical history on file.  No past surgical history on file.  Current Outpatient Rx  . Order #: [id number] Class: Print  . Order #: [id number] Class: Print    Allergies:  Cortisone  Family History: No family history on file.  Social History: Social History  Substance Use Topics  . Smoking status: Never Smoker  . Smokeless tobacco: Not on file  . Alcohol use No     Review of Systems:   10 point review of systems was performed and was otherwise negative:  Constitutional: No fever Eyes: No visual disturbances ENT: No sore throat, ear pain Cardiac: No chest pain Respiratory: No shortness of breath, wheezing, or stridor Abdomen: No abdominal pain, no vomiting, No diarrhea Endocrine: No weight loss, No night sweats Extremities: No peripheral edema, cyanosis Skin: No rashes, easy bruising Neurologic: No focal weakness, trouble with speech or swollowing Urologic: No dysuria, Hematuria, or urinary frequency He does describe a decreased appetite   Physical Exam:  ED Triage Vitals  Enc Vitals Group     BP      Pulse      Resp      Temp       Temp src      SpO2      Weight      Height      Head Circumference      Peak Flow      Pain Score      Pain Loc      Pain Edu?      Excl. in [place]?     General: Awake , Alert , and Oriented times 3; GCS 15 Head: Normal cephalic , atraumatic Eyes: Pupils equal , round, reactive to light Nose/Throat: No nasal drainage, patent upper airway without erythema or exudate.  Neck: Supple, Full range of motion, No anterior adenopathy or palpable thyroid masses Lungs: Clear to ascultation without wheezes , rhonchi, or rales Heart: Regular rate, regular rhythm without murmurs , gallops , or rubs Abdomen: Soft, non tender without rebound, guarding , or rigidity; bowel sounds positive and symmetric in all 4 quadrants. No organomegaly .        Extremities: Diffuse swelling with no significant erythema or warmth. Normal location of the patella. Neurologic: normal ambulation, Motor symmetric without deficits, sensory intact Skin: warm, dry, no rashes   Labs:   All laboratory work was reviewed including any pertinent negatives or positives listed below:  Labs Reviewed  COMPREHENSIVE METABOLIC PANEL  CBC WITH DIFFERENTIAL/PLATELET  URIC ACID  Patient  has an elevated white blood cell count. Uric acid is also slightly elevated   ED Course:  Clinically the patient presents with acute exacerbation of chronic gout. He has been ambulatory and able to flex and extend his left knee. Felt this was unlikely to be osteomyelitis. Patient denies any fever and has no fever here in the emergency department. He felt symptomatically improved with Toradol. Patient was discharged with a previous prescription has been given it to him for gout and with his family presently advised that he should return here if the pain is not relieved over the next 24-48 hours. He should also return here for fever or difficulty with ambulation. Clinical Course     Assessment:  Acute exacerbation of chronic gouty  arthritis      Plan: * Outpatient " New Prescriptions   OXYCODONE-ACETAMINOPHEN (PERCOCET) 7.5-325 MG TABLET    Take 1 tablet by mouth every 6 (six) hours as needed for severe pain.   PREDNISONE (DELTASONE) 20 MG TABLET    Take 2 tablets (40 mg total) by mouth daily.  " Patient was advised to return immediately if condition worsens. Patient was advised to follow up with their primary care physician or other specialized physicians involved in their outpatient care. The patient and/or family member/power of attorney had laboratory results reviewed at the bedside. All questions and concerns were addressed and appropriate discharge instructions were distributed by the nursing staff.             Daymon Larsen, MD 06/29/16 1126

## 2016-06-29 NOTE — ED Triage Notes (Signed)
Pt arrives from home with reports of left knee and left foot pain  Pt reports pain for approx 3 days  Pt also reports no appetite for the last two days   BP elevated upon arrival

## 2016-06-29 NOTE — Discharge Instructions (Signed)
Return to emergency department especially for increasing pain and swelling regardless of treatment. Return for fever, vomiting, inability to bear weight on the left knee, or any other new concerns  Please return immediately if condition worsens. Please contact her primary physician or the physician you were given for referral. If you have any specialist physicians involved in her treatment and plan please also contact them. Thank you for using [facility] emergency [facility].

## 2017-01-11 ENCOUNTER — Encounter: Payer: Self-pay | Admitting: *Deleted

## 2017-01-11 ENCOUNTER — Emergency Department: Payer: Medicare Other

## 2017-01-11 ENCOUNTER — Inpatient Hospital Stay
Admission: EM | Admit: 2017-01-11 | Discharge: 2017-01-15 | DRG: 418 | Disposition: A | Discharge status: Home Health | Payer: Medicare Other | Attending: Specialist | Admitting: Specialist

## 2017-01-11 DIAGNOSIS — E785 Hyperlipidemia, unspecified: Secondary | ICD-10-CM | POA: Diagnosis present

## 2017-01-11 DIAGNOSIS — I509 Heart failure, unspecified: Secondary | ICD-10-CM | POA: Diagnosis present

## 2017-01-11 DIAGNOSIS — K810 Acute cholecystitis: Secondary | ICD-10-CM | POA: Diagnosis present

## 2017-01-11 DIAGNOSIS — Z888 Allergy status to other drugs, medicaments and biological substances status: Secondary | ICD-10-CM

## 2017-01-11 DIAGNOSIS — I110 Hypertensive heart disease with heart failure: Secondary | ICD-10-CM | POA: Diagnosis present

## 2017-01-11 DIAGNOSIS — Z7982 Long term (current) use of aspirin: Secondary | ICD-10-CM

## 2017-01-11 DIAGNOSIS — K8000 Calculus of gallbladder with acute cholecystitis without obstruction: Principal | ICD-10-CM | POA: Diagnosis present

## 2017-01-11 DIAGNOSIS — Z79899 Other long term (current) drug therapy: Secondary | ICD-10-CM

## 2017-01-11 DIAGNOSIS — K819 Cholecystitis, unspecified: Secondary | ICD-10-CM | POA: Insufficient documentation

## 2017-01-11 DIAGNOSIS — R109 Unspecified abdominal pain: Secondary | ICD-10-CM

## 2017-01-11 DIAGNOSIS — Z419 Encounter for procedure for purposes other than remedying health state, unspecified: Secondary | ICD-10-CM

## 2017-01-11 DIAGNOSIS — I482 Chronic atrial fibrillation: Secondary | ICD-10-CM | POA: Diagnosis present

## 2017-01-11 DIAGNOSIS — I82409 Acute embolism and thrombosis of unspecified deep veins of unspecified lower extremity: Secondary | ICD-10-CM

## 2017-01-11 DIAGNOSIS — I96 Gangrene, not elsewhere classified: Secondary | ICD-10-CM | POA: Diagnosis present

## 2017-01-11 LAB — URINALYSIS, COMPLETE (UACMP) WITH MICROSCOPIC
Bacteria, UA: NONE SEEN
Bilirubin Urine: NEGATIVE
Glucose, UA: NEGATIVE mg/dL
Ketones, ur: NEGATIVE mg/dL
Leukocytes, UA: NEGATIVE
Nitrite: NEGATIVE
Protein, ur: >=300 mg/dL — AB
Specific Gravity, Urine: 1.016 (ref 1.005–1.030)
Squamous Epithelial / LPF: NONE SEEN
pH: 6 (ref 5.0–8.0)

## 2017-01-11 LAB — CBC
HCT: 46.6 % (ref 40.0–52.0)
Hemoglobin: 15.7 g/dL (ref 13.0–18.0)
MCH: 28.4 pg (ref 26.0–34.0)
MCHC: 33.7 g/dL (ref 32.0–36.0)
MCV: 84.3 fL (ref 80.0–100.0)
Platelets: 192 10*3/uL (ref 150–440)
RBC: 5.53 MIL/uL (ref 4.40–5.90)
RDW: 15 % — ABNORMAL HIGH (ref 11.5–14.5)
WBC: 12.9 10*3/uL — ABNORMAL HIGH (ref 3.8–10.6)

## 2017-01-11 LAB — PROTIME-INR
INR: 1.16
Prothrombin Time: 14.9 seconds (ref 11.4–15.2)

## 2017-01-11 LAB — COMPREHENSIVE METABOLIC PANEL
ALT: 12 U/L — ABNORMAL LOW (ref 17–63)
AST: 24 U/L (ref 15–41)
Albumin: 3.9 g/dL (ref 3.5–5.0)
Alkaline Phosphatase: 75 U/L (ref 38–126)
Anion gap: 9 (ref 5–15)
BUN: 18 mg/dL (ref 6–20)
CO2: 22 mmol/L (ref 22–32)
Calcium: 9.4 mg/dL (ref 8.9–10.3)
Chloride: 106 mmol/L (ref 101–111)
Creatinine, Ser: 1.52 mg/dL — ABNORMAL HIGH (ref 0.61–1.24)
GFR calc Af Amer: 49 mL/min — ABNORMAL LOW (ref >60)
GFR calc non Af Amer: 42 mL/min — ABNORMAL LOW (ref >60)
Glucose, Bld: 125 mg/dL — ABNORMAL HIGH (ref 65–99)
Potassium: 3.8 mmol/L (ref 3.5–5.1)
Sodium: 137 mmol/L (ref 135–145)
Total Bilirubin: 1.7 mg/dL — ABNORMAL HIGH (ref 0.3–1.2)
Total Protein: 7.8 g/dL (ref 6.5–8.1)

## 2017-01-11 LAB — LIPASE, BLOOD: Lipase: 15 U/L (ref 11–51)

## 2017-01-11 LAB — BILIRUBIN, FRACTIONATED(TOT/DIR/INDIR)
Bilirubin, Direct: 0.3 mg/dL (ref 0.1–0.5)
Indirect Bilirubin: 1.4 mg/dL — ABNORMAL HIGH (ref 0.3–0.9)
Total Bilirubin: 1.7 mg/dL — ABNORMAL HIGH (ref 0.3–1.2)

## 2017-01-11 LAB — TYPE AND SCREEN
ABO/RH(D): O NEG
Antibody Screen: NEGATIVE

## 2017-01-11 LAB — APTT: aPTT: 47 seconds — ABNORMAL HIGH (ref 24–36)

## 2017-01-11 LAB — BRAIN NATRIURETIC PEPTIDE: B Natriuretic Peptide: 530 pg/mL — ABNORMAL HIGH (ref 0.0–100.0)

## 2017-01-11 IMAGING — CT CT ABD-PELV W/ CM
2 of 5 series · 16 of 46 positions shown, 18 images · IV contrast (iopamidol)
Comparison: [DATE]

CLINICAL DATA: Right lower quadrant pain for 4 days

EXAM:
CT ABDOMEN AND PELVIS WITH CONTRAST
TECHNIQUE: Multidetector CT imaging of the abdomen and pelvis was performed
using the standard protocol following bolus administration of
intravenous contrast.
CONTRAST:  100mL [AJ] IOPAMIDOL ([AJ]) INJECTION 61%

[Series 2: axial st · axial · 0.82mm/px · z∈[-428,+2]mm · 13 of 98 slices shown, 15 images]
[im 6/98  soft-tissue]
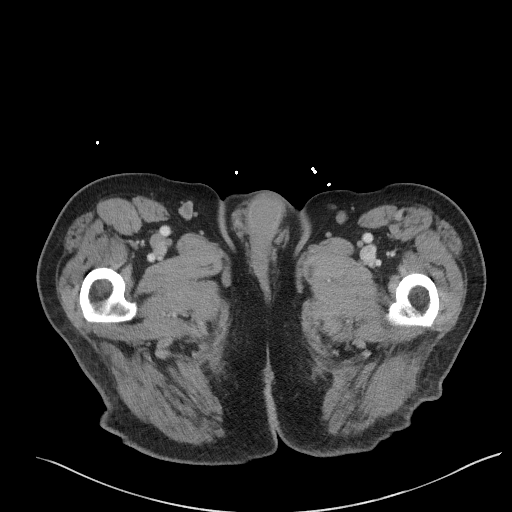
[im 6/98  bone]
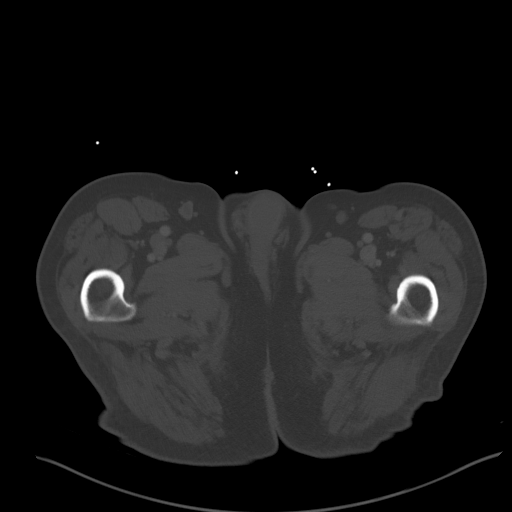
[im 12/98  soft-tissue]
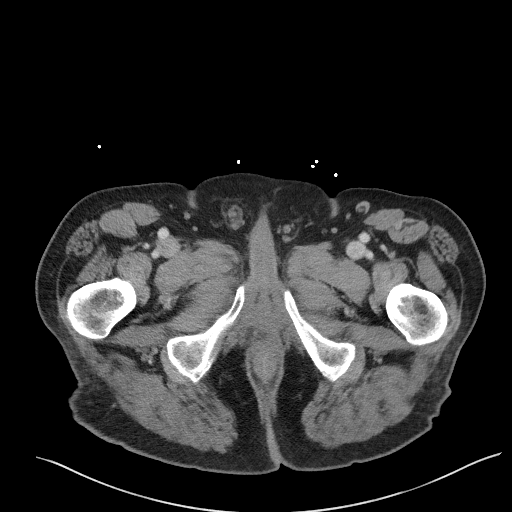
[im 23/98  soft-tissue]
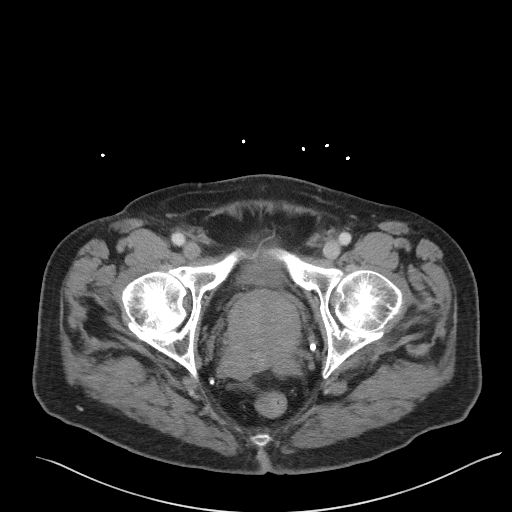
[im 29/98  soft-tissue]
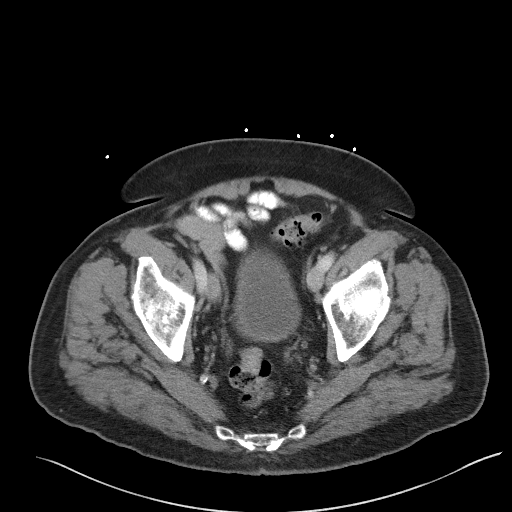
[im 35/98  soft-tissue]
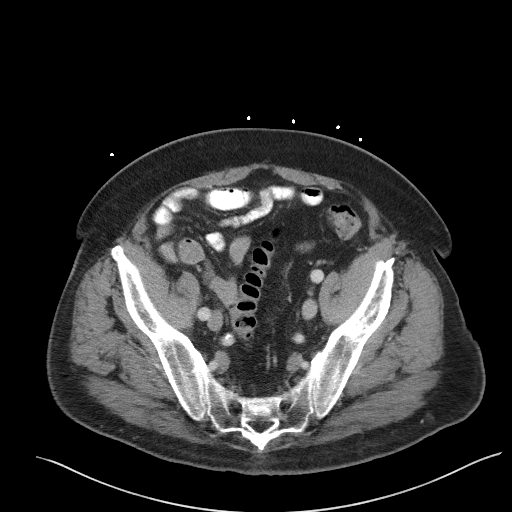
[im 40/98  soft-tissue]
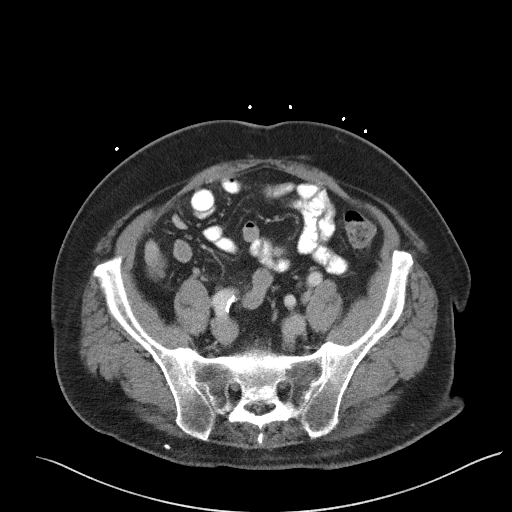
[im 52/98  soft-tissue]
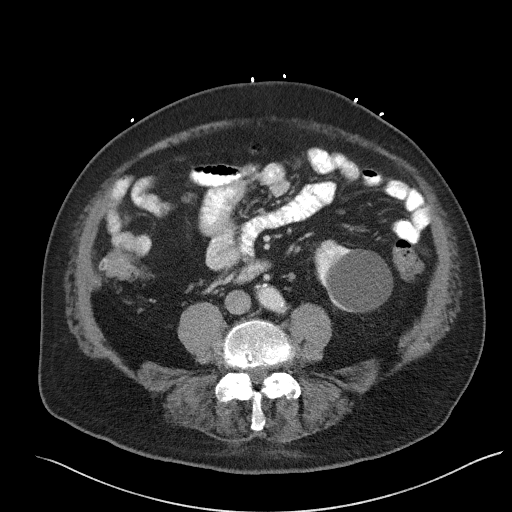
[im 58/98  soft-tissue]
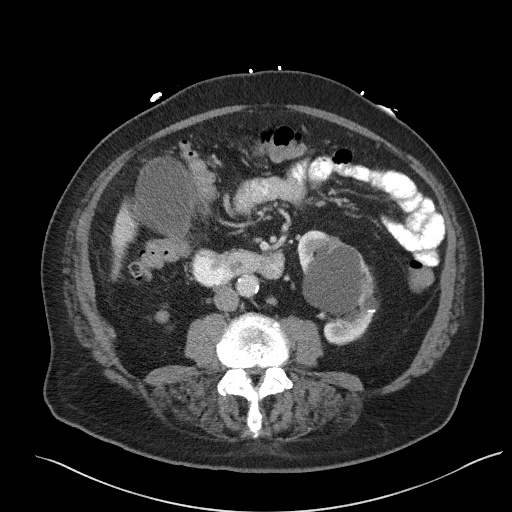
[im 63/98  soft-tissue]
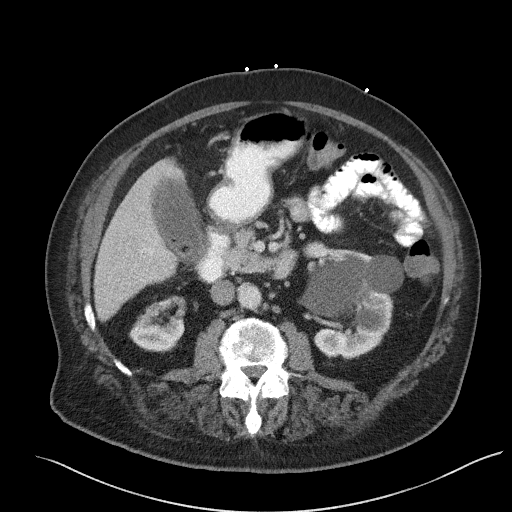
[im 63/98  bone]
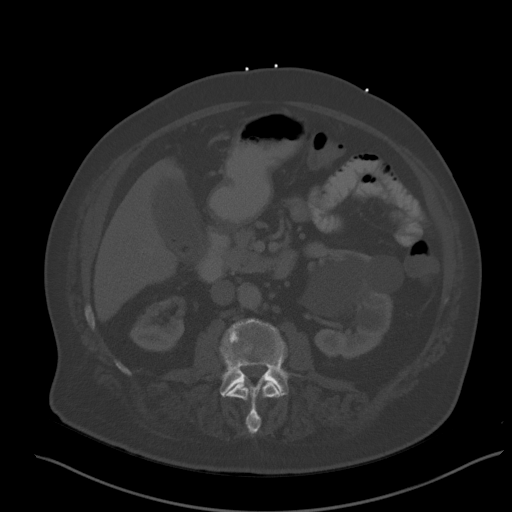
[im 69/98  soft-tissue]
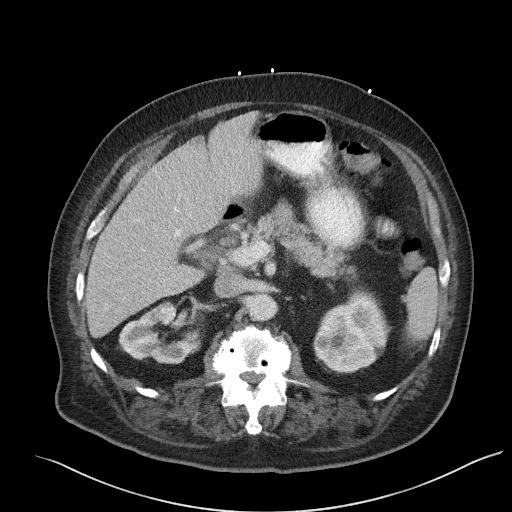
[im 75/98  soft-tissue]
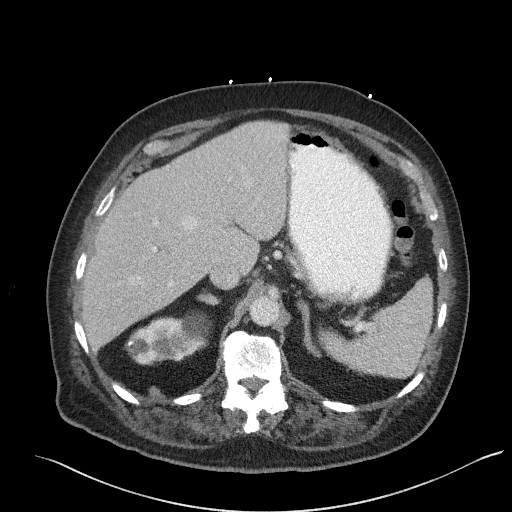
[im 86/98  soft-tissue]
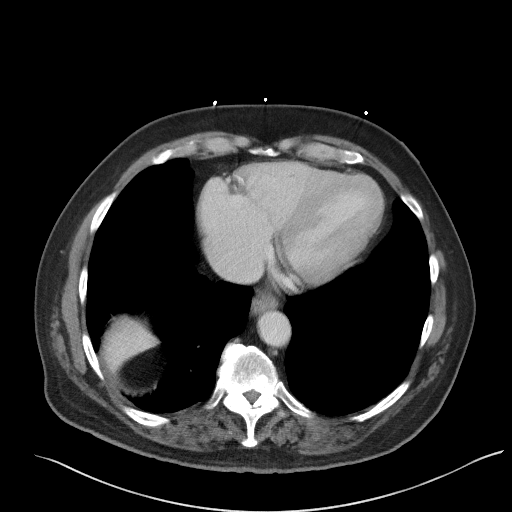
[im 92/98  soft-tissue]
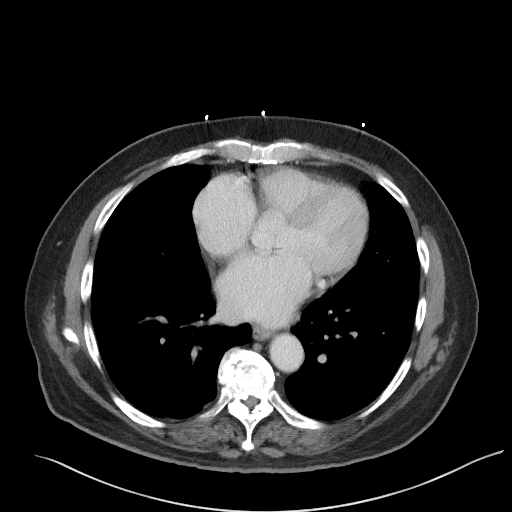

[Series 5: coronal st · coronal · 0.78mm/px · 3 of 104 slices shown]
[im 35/104  soft-tissue]
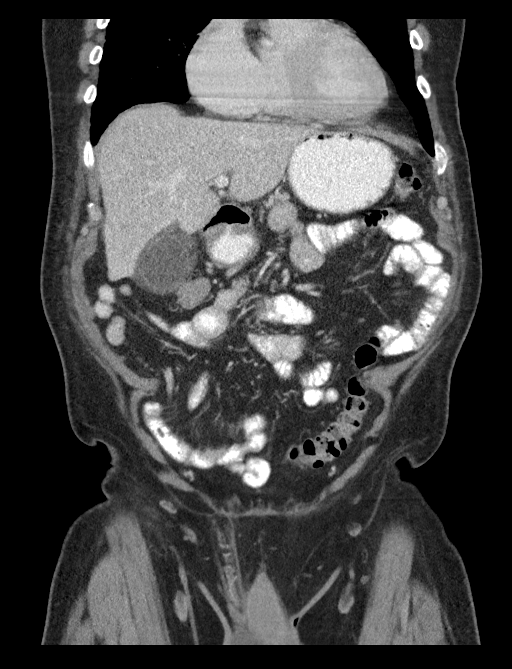
[im 46/104  soft-tissue]
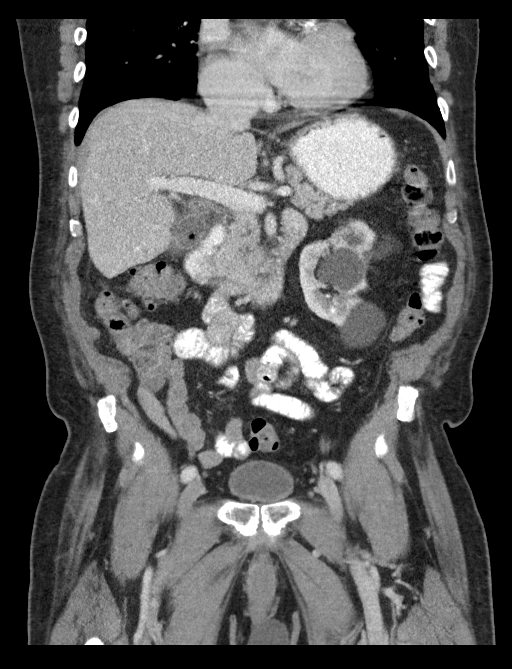
[im 58/104  soft-tissue]
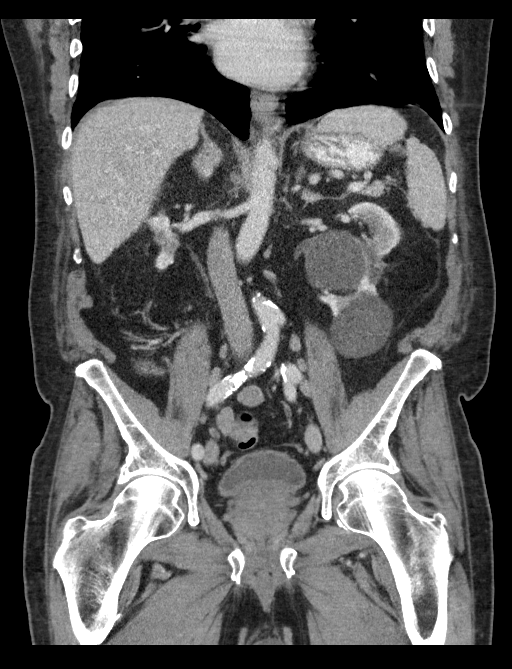

[16 of 46 positions shown; findings below may reference images not displayed]

FINDINGS: Lower chest: No acute abnormality.

Hepatobiliary: Normal liver without a focal hepatic mass.
Cholelithiasis. Severe gallbladder wall thickening. No intrahepatic
or extrahepatic biliary ductal dilatation.

Pancreas: Unremarkable. No pancreatic ductal dilatation or
surrounding inflammatory changes.

Spleen: Normal in size without focal abnormality.

Adrenals/Urinary Tract: Normal adrenal glands. Multiple hypodense,
fluid attenuating bilateral renal masses most consistent with cysts.
10 mm complex cystic right upper pole renal mass with small
peripheral calcification, but overall incompletely characterized.
Multiple nonobstructing left nephrolithiasis. No hydronephrosis.
Right renal cortical scarring anteriorly.

Stomach/Bowel: Stomach is within normal limits. Appendix appears
normal. No evidence of bowel wall thickening, distention, or
inflammatory changes.

Vascular/Lymphatic: Normal caliber abdominal aorta with
atherosclerosis. No lymphadenopathy.

Reproductive: Enlarged prostate gland.

Other: No abdominal wall hernia or abnormality. No abdominopelvic
ascites.

Musculoskeletal: No acute osseous abnormality. No lytic or sclerotic
osseous lesion. Degenerative disc disease and facet arthropathy
throughout the thoracolumbar spine.
IMPRESSION: 1. Cholelithiasis with gallbladder wall thickening concerning for
acute cholecystitis. Correlate with laboratory values.
2. Normal appendix.
3. Incompletely characterized 10 mm cystic right upper pole renal
mass likely reflecting a complicated cyst.
4. Enlarged prostate gland.
5. Nonobstructing left nephrolithiasis.

## 2017-01-11 MED ORDER — IOPAMIDOL (ISOVUE-300) INJECTION 61%
100.0000 mL | Freq: Once | INTRAVENOUS | Status: AC | PRN
  Administered 2017-01-11: 100 mL via INTRAVENOUS

## 2017-01-11 MED ORDER — FUROSEMIDE 40 MG PO TABS: 40.0000 mg | ORAL_TABLET | ORAL | Status: DC | PRN

## 2017-01-11 MED ORDER — ATORVASTATIN CALCIUM 20 MG PO TABS
40.0000 mg | ORAL_TABLET | Freq: Every day | ORAL | Status: DC
  Administered 2017-01-11 – 2017-01-15 (×5): 40 mg via ORAL
  Filled 2017-01-11 (×5): qty 2

## 2017-01-11 MED ORDER — ACETAMINOPHEN 325 MG PO TABS
650.0000 mg | ORAL_TABLET | Freq: Four times a day (QID) | ORAL | Status: DC | PRN
  Administered 2017-01-14 (×2): 650 mg via ORAL
  Filled 2017-01-11 (×2): qty 2

## 2017-01-11 MED ORDER — SODIUM CHLORIDE 0.9 % IV SOLN
3.0000 g | Freq: Four times a day (QID) | INTRAVENOUS | Status: DC
  Administered 2017-01-11 – 2017-01-15 (×15): 3 g via INTRAVENOUS
  Filled 2017-01-11 (×19): qty 3

## 2017-01-11 MED ORDER — SODIUM CHLORIDE 0.9 % IV BOLUS (SEPSIS)
500.0000 mL | Freq: Once | INTRAVENOUS | Status: AC
  Administered 2017-01-11: 500 mL via INTRAVENOUS

## 2017-01-11 MED ORDER — HYDRALAZINE HCL 50 MG PO TABS
50.0000 mg | ORAL_TABLET | Freq: Three times a day (TID) | ORAL | Status: DC
  Administered 2017-01-11 – 2017-01-15 (×10): 50 mg via ORAL
  Filled 2017-01-11 (×11): qty 1

## 2017-01-11 MED ORDER — ENOXAPARIN SODIUM 40 MG/0.4ML ~~LOC~~ SOLN
40.0000 mg | SUBCUTANEOUS | Status: DC
  Administered 2017-01-11: 40 mg via SUBCUTANEOUS
  Filled 2017-01-11: qty 0.4

## 2017-01-11 MED ORDER — TRAMADOL HCL 50 MG PO TABS
50.0000 mg | ORAL_TABLET | Freq: Four times a day (QID) | ORAL | Status: DC | PRN
  Administered 2017-01-12 – 2017-01-15 (×8): 50 mg via ORAL
  Filled 2017-01-11 (×8): qty 1

## 2017-01-11 MED ORDER — SENNOSIDES-DOCUSATE SODIUM 8.6-50 MG PO TABS: 1.0000 | ORAL_TABLET | Freq: Every evening | ORAL | Status: DC | PRN

## 2017-01-11 MED ORDER — IOPAMIDOL (ISOVUE-300) INJECTION 61%
30.0000 mL | Freq: Once | INTRAVENOUS | Status: AC | PRN
  Administered 2017-01-11: 30 mL via ORAL

## 2017-01-11 MED ORDER — PIPERACILLIN-TAZOBACTAM 3.375 G IVPB 30 MIN
3.3750 g | Freq: Once | INTRAVENOUS | Status: AC
  Administered 2017-01-11: 3.375 g via INTRAVENOUS
  Filled 2017-01-11: qty 50

## 2017-01-11 MED ORDER — BISACODYL 10 MG RE SUPP: 10.0000 mg | Freq: Every day | RECTAL | Status: DC | PRN

## 2017-01-11 MED ORDER — SODIUM CHLORIDE 0.9 % IV SOLN
INTRAVENOUS | Status: DC
  Administered 2017-01-11 – 2017-01-13 (×5): via INTRAVENOUS

## 2017-01-11 MED ORDER — ACETAMINOPHEN 650 MG RE SUPP: 650.0000 mg | Freq: Four times a day (QID) | RECTAL | Status: DC | PRN

## 2017-01-11 MED ORDER — FELODIPINE ER 5 MG PO TB24
10.0000 mg | ORAL_TABLET | Freq: Every day | ORAL | Status: DC
  Administered 2017-01-12 – 2017-01-15 (×4): 10 mg via ORAL
  Filled 2017-01-11: qty 2
  Filled 2017-01-11: qty 1
  Filled 2017-01-11 (×2): qty 2

## 2017-01-11 MED ORDER — ATENOLOL 25 MG PO TABS
50.0000 mg | ORAL_TABLET | Freq: Every day | ORAL | Status: DC
  Administered 2017-01-11 – 2017-01-15 (×5): 50 mg via ORAL
  Filled 2017-01-11 (×5): qty 2

## 2017-01-11 NOTE — Consult Note (Signed)
Patient ID: George Bolton, male   DOB: [birth year], 76 y.o.   MRN: [id number]  HPI George Bolton is a 76 y.o. male asked by Dr. Burlene Arnt to be seen in consultation. On periumbilical region noticed started 2 days ago. No fevers, no chills, no nausea or vomiting. No evidence of biliary obstruction. He does have a history of CHF and a fib not compliant and has never been to a cardiologist. No specific alleviating or aggravating factors. w/u including a CT scan was pers. Reviewed, Distended GB w thick wall. No biliary dilation. U/S nml CBD, thick wall and pericholecystic fluid. HE did have an episode of A fib w RVR in the ER. He does have dyspnea on exertion   HPI  Past Medical History:  Diagnosis Date  . A-fib ([place])   . CHF (congestive heart failure) ([place])   . Gout   . Hypertension   . Irregular heart beat     History reviewed. No pertinent surgical history.  No family history on file.  Social History Social History  Substance Use Topics  . Smoking status: Never Smoker  . Smokeless tobacco: Never Used  . Alcohol use No    Allergies  Allergen Reactions  . Cortisone     Current Facility-Administered Medications  Medication Dose Route Frequency Provider Last Rate Last Dose  . piperacillin-tazobactam (ZOSYN) IVPB 3.375 g  3.375 g Intravenous Once Schuyler Amor, MD 100 mL/hr at 01/11/17 1440 3.375 g at 01/11/17 1440   Current Outpatient Prescriptions  Medication Sig Dispense Refill  . aspirin 325 MG tablet Take 325 mg by mouth daily.    Marland Kitchen atenolol (TENORMIN) 50 MG tablet Take 1 tablet by mouth daily.    Marland Kitchen atorvastatin (LIPITOR) 40 MG tablet Take 1 tablet by mouth daily.    . felodipine (PLENDIL) 10 MG 24 hr tablet Take 1 tablet by mouth daily.    . furosemide (LASIX) 40 MG tablet Take 1 tablet by mouth as needed.     . hydrALAZINE (APRESOLINE) 50 MG tablet Take 1 tablet by mouth 3 (three) times daily.    Marland Kitchen oxyCODONE-acetaminophen (PERCOCET) 7.5-325 MG tablet Take 1 tablet  by mouth every 6 (six) hours as needed for severe pain. (Patient not taking: Reported on 01/11/2017) 20 tablet 0  . predniSONE (DELTASONE) 20 MG tablet Take 2 tablets (40 mg total) by mouth daily. (Patient not taking: Reported on 01/11/2017) 10 tablet 0     Review of Systems A 10 point review of systems was asked and was negative except for the information on the HPI  Physical Exam Blood pressure 139/89, pulse 98, temperature 97.8 F (36.6 C), temperature source Oral, resp. rate 19, height 6\' 1"  (1.854 m), weight 89.8 kg (198 lb), SpO2 94 %. CONSTITUTIONAL: Elderly male in nad EYES: Pupils are equal, round, and reactive to light, Sclera are non-icteric. EARS, NOSE, MOUTH AND THROAT: The oropharynx is clear. The oral mucosa is pink and moist. Hearing is intact to voice. LYMPH NODES:  Lymph nodes in the neck are normal. RESPIRATORY:  Lungs are clear. There is normal respiratory effort, with equal breath sounds bilaterally, and without pathologic use of accessory muscles. CARDIOVASCULAR: Heart isi regular without murmurs, gallops, or rubs. HR 110s GI: The abdomen is  soft, TTP RLQ and RUQ.  No peritonitis, no definitive murphy sign GU: Rectal deferred.   MUSCULOSKELETAL: Normal muscle strength and tone.  Pedal edema up to his knee. SKIN: Turgor is good and there are no pathologic skin  lesions or ulcers. NEUROLOGIC: Motor and sensation is grossly normal. Cranial nerves are grossly intact. PSYCH:  Oriented to person, place and time. Affect is normal.  Data Reviewed  I have personally reviewed the patient's imaging, laboratory findings and medical records.    Assessment/ Plan 76 yo male w multiple medical issues including untreated A fib, CHF now with acute cholecystitis. Given his comorbidities he needs to be optimized and cardiac w/u needs to be started. I do think he has uncompensated CHF that needs to be addressed before any surgical intervention is attempted. Recommend admission to  hospitalist service, cardiac w/u w consult and echo. May need cholecystostomy tube if his sxs do not improve w A/Bs alone. No need for emergent surgical intervention at this time. D/W Dr. Posey Pronto and Dr. Burlene Arnt in detail.  Caroleen Hamman, MD FACS General Surgeon 01/11/2017, 3:04 PM

## 2017-01-11 NOTE — ED Notes (Signed)
Pt denies N/V/D

## 2017-01-11 NOTE — Consult Note (Signed)
Pharmacy Antibiotic Note  George Bolton is a 76 y.o. male admitted on 01/11/2017 with intra-abdominal infection- cholecystits w/ gallstones.  Pharmacy has been consulted for unasyn dosing.  Plan: unasyn 3g q 6 hours  Height: 6\' 1"  (185.4 cm) Weight: 198 lb (89.8 kg) IBW/kg (Calculated) : 79.9  Temp (24hrs), Avg:97.8 F (36.6 C), Min:97.8 F (36.6 C), Max:97.8 F (36.6 C)   Recent Labs Lab 01/11/17 1127  WBC 12.9*  CREATININE 1.52*    Estimated Creatinine Clearance: 46 mL/min (A) (by C-G formula based on SCr of 1.52 mg/dL (H)).    Allergies  Allergen Reactions  . Cortisone     Antimicrobials this admission: unasyn 3/19 >>  zosyn 3/19 >> one dose  Dose adjustments this admission:   Microbiology results:   Thank you for allowing pharmacy to be a part of this patient's care.  Ramond Dial, Pharm.D, BCPS Clinical Pharmacist  01/11/2017 3:43 PM

## 2017-01-11 NOTE — ED Notes (Signed)
Blood cultures drawn before antibiotics administered

## 2017-01-11 NOTE — Progress Notes (Signed)
Family Meeting Note  Advance Directive :no  Today a meeting took place with the  patient   The following clinical team members were present during this meeting in the ER with MD The following were discussed:Patient's diagnosis: , Patient's progosis: Patient being admitted with acute cholecystitis/cholelithiasis and elevated white count A. fib with RVR. Discussed CODE STATUS with patient he tells me he does not want CPR and does not want any machines or artificial feeding and request DO NOT RESUSCITATE   Time spent during discussion:: 78min  Ellesse Antenucci, MD

## 2017-01-11 NOTE — Consult Note (Signed)
[facility] Consultation Note  Patient ID: George Bolton, MRN: [id number], DOB/AGE: August 29, 1941 76 y.o. Admit date: 01/11/2017   Date of Consult: 01/11/2017 Primary Physician: Baltazar Apo, MD Primary Cardiologist: None  Chief Complaint:  Chief Complaint  Patient presents with  . Abdominal Pain   Reason for Consult: atrial fibrillation with rapid ventricular rate  HPI: 76 y.o. male with known chronic nonvalvular atrial fibrillation with a history of essential hypertension mixed hyperlipidemia and heart failure who is had some significant abdominal discomfort and right lower quadrant discomfort over the last 3-4 days increasing in frequency under intensity which was helped slightly by laxative use. He did come to the emergency room with an elevated white count and concerns of the potential for abdominal infection or gastritis. The patient has had some improvements since he's been here although he had an EKG that has shown atrial fibrillation with rapid ventricular rate. The patient has not taken his atenolol for heart rate control of the last few days and therefore may need to reinstatement. Additionally the patient has had no use of anticoagulation for further risk reduction in stroke with atrial fibrillation due to his wishes not to take anticoagulants. Blood pressure previously has been controlled fairly well  Past Medical History:  Diagnosis Date  . A-fib ([place])   . CHF (congestive heart failure) ([place])   . Gout   . Hypertension   . Irregular heart beat       Surgical History: History reviewed. No pertinent surgical history.   Home Meds: Prior to Admission medications   Medication Sig Start Date End Date Taking? Authorizing Provider  aspirin 325 MG tablet Take 325 mg by mouth daily.   Yes Historical Provider, MD  atenolol (TENORMIN) 50 MG tablet Take 1 tablet by mouth daily. 01/09/17  Yes Historical Provider, MD  atorvastatin (LIPITOR) 40 MG tablet Take 1 tablet by  mouth daily.   Yes Historical Provider, MD  felodipine (PLENDIL) 10 MG 24 hr tablet Take 1 tablet by mouth daily.   Yes Historical Provider, MD  furosemide (LASIX) 40 MG tablet Take 1 tablet by mouth as needed.    Yes Historical Provider, MD  hydrALAZINE (APRESOLINE) 50 MG tablet Take 1 tablet by mouth 3 (three) times daily. 12/26/16  Yes Historical Provider, MD    Inpatient Medications:  . ampicillin-sulbactam (UNASYN) IV  3 g Intravenous Q6H     Allergies:  Allergies  Allergen Reactions  . Cortisone     Social History   Social History  . Marital status: Married    Spouse name: N/A  . Number of children: N/A  . Years of education: N/A   Occupational History  . Not on file.   Social History Main Topics  . Smoking status: Never Smoker  . Smokeless tobacco: Never Used  . Alcohol use No  . Drug use: Unknown  . Sexual activity: Not on file   Other Topics Concern  . Not on file   Social History Narrative  . No narrative on file     No family history on file.   Review of Systems Positive for Abdominal discomfort Negative for: General:  chills, fever, night sweats or weight changes.  Cardiovascular: PND orthopnea syncope dizziness  Dermatological skin lesions rashes Respiratory: Cough congestion Urologic: Frequent urination urination at night and hematuria Abdominal: Positive for nausea, negative for vomiting, diarrhea, bright red blood per rectum, melena, or hematemesis Neurologic: negative for visual changes, and/or hearing changes  All other systems reviewed  and are otherwise negative except as noted above.  Labs: No results for input(s): CKTOTAL, CKMB, TROPONINI in the last 72 hours. Lab Results  Component Value Date   WBC 12.9 (H) 01/11/2017   HGB 15.7 01/11/2017   HCT 46.6 01/11/2017   MCV 84.3 01/11/2017   PLT 192 01/11/2017    Recent Labs Lab 01/11/17 1127  NA 137  K 3.8  CL 106  CO2 22  BUN 18  CREATININE 1.52*  CALCIUM 9.4  PROT 7.8   BILITOT 1.7*  1.7*  ALKPHOS 75  ALT 12*  AST 24  GLUCOSE 125*   No results found for: CHOL, HDL, LDLCALC, TRIG No results found for: DDIMER  Radiology/Studies:  Ct Abdomen Pelvis W Contrast  Result Date: 01/11/2017 CLINICAL DATA:  Right lower quadrant pain for 4 days EXAM: CT ABDOMEN AND PELVIS WITH CONTRAST TECHNIQUE: Multidetector CT imaging of the abdomen and pelvis was performed using the standard protocol following bolus administration of intravenous contrast. CONTRAST:  144mL ISOVUE-300 IOPAMIDOL (ISOVUE-300) INJECTION 61% COMPARISON:  09/23/2012 FINDINGS: Lower chest: No acute abnormality. Hepatobiliary: Normal liver without a focal hepatic mass. Cholelithiasis. Severe gallbladder wall thickening. No intrahepatic or extrahepatic biliary ductal dilatation. Pancreas: Unremarkable. No pancreatic ductal dilatation or surrounding inflammatory changes. Spleen: Normal in size without focal abnormality. Adrenals/Urinary Tract: Normal adrenal glands. Multiple hypodense, fluid attenuating bilateral renal masses most consistent with cysts. 10 mm complex cystic right upper pole renal mass with small peripheral calcification, but overall incompletely characterized. Multiple nonobstructing left nephrolithiasis. No hydronephrosis. Right renal cortical scarring anteriorly. Stomach/Bowel: Stomach is within normal limits. Appendix appears normal. No evidence of bowel wall thickening, distention, or inflammatory changes. Vascular/Lymphatic: Normal caliber abdominal aorta with atherosclerosis. No lymphadenopathy. Reproductive: Enlarged prostate gland. Other: No abdominal wall hernia or abnormality. No abdominopelvic ascites. Musculoskeletal: No acute osseous abnormality. No lytic or sclerotic osseous lesion. 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 IMPRESSION: 1. Cholelithiasis with gallbladder wall thickening concerning for acute cholecystitis. Correlate with laboratory  values. 2. Normal appendix. 3. Incompletely characterized 10 mm cystic right upper pole renal mass likely reflecting a complicated cyst. 4. Enlarged prostate gland. 5. Nonobstructing left nephrolithiasis. Electronically Signed   By: Kathreen Devoid   On: 01/11/2017 13:42   US Abdomen Limited Ruq  Result Date: 01/11/2017 CLINICAL DATA:  Three-day history abdominal pain EXAM: US ABDOMEN LIMITED - RIGHT UPPER QUADRANT COMPARISON:  CT abdomen and pelvis January 11, 2017 FINDINGS: Gallbladder: Within the gallbladder, there are multiple echogenic foci which move and shadow consistent with gallstones. Largest gallstone measures 1.4 cm in length. Gallbladder wall is thickened and edematous with pericholecystic fluid. No sonographic Murphy sign noted by sonographer. Common bile duct: Diameter: 4 mm. No intrahepatic or extrahepatic biliary duct dilatation. Liver: No focal lesion identified. Within normal limits in parenchymal echogenicity. IMPRESSION: Findings as described above consistent with acute cholecystitis. Electronically Signed   By: Lowella Grip III M.D.   On: 01/11/2017 14:43    EKG: Atrial fibrillation with rapid ventricular rate  Weights: Filed Weights   01/11/17 1119  Weight: 89.8 kg (198 lb)     Physical Exam: Blood pressure (!) 161/85, pulse (!) 123, temperature 98.8 F (37.1 C), temperature source Oral, resp. rate 20, height 6\' 1"  (1.854 m), weight 89.8 kg (198 lb), SpO2 98 %. Body mass index is 26.12 kg/m. General: Well developed, well nourished, in no acute distress. Head eyes ears nose throat: Normocephalic, atraumatic, sclera non-icteric, no xanthomas, nares are without discharge. No  apparent thyromegaly and/or mass  Lungs: Normal respiratory effort.  Few wheezes, no rales, no rhonchi.  Heart: Irregular with normal S1 S2. no murmur gallop, no rub, PMI is normal size and placement, carotid upstroke normal without bruit, jugular venous pressure is normal Abdomen: Soft, tender,  distended with normoactive bowel sounds. No hepatomegaly. No rebound/guarding. No obvious abdominal masses. Abdominal aorta is normal size without bruit Extremities: No edema. no cyanosis, no clubbing, no ulcers  Peripheral : 2+ bilateral upper extremity pulses, 2+ bilateral femoral pulses, 2+ bilateral dorsal pedal pulse Neuro: Alert and oriented. No facial asymmetry. No focal deficit. Moves all extremities spontaneously. Musculoskeletal: Normal muscle tone without kyphosis Psych:  Responds to questions appropriately with a normal affect.    Assessment: 76 year old male with the chronic nonvalvular atrial fibrillation with rapid ventricular rate essential hypertension makes hyperlipidemia likely worsening due to recent abdominal discomfort and possible gastroenteritis  Plan: 1. Reinstatement of atenolol at previous dose for heart rate control of atrial fibrillation 2. Echocardiogram for LV systolic dysfunction valvular heart disease and adjustments of medication management 3. Supportive care of abdominal discomfort and further intervention is necessary 4. No anticoagulation for atrial fibrillation with stroke risk due to patient's wishes  Signed, Corey Skains M.D. [facility] 01/11/2017, 5:37 PM

## 2017-01-11 NOTE — ED Provider Notes (Addendum)
[facility] Emergency Department Provider Note  ____________________________________________   I have reviewed the triage vital signs and the nursing notes.   HISTORY  Chief Complaint Abdominal Pain    HPI George Bolton is a 76 y.o. male who presents today complaining of abdominal pain on the right side for a few days. Saturday he believes. Patient states that he has had no fever no chills no nausea no vomiting no diarrhea. He took a laxative as he thought that would help but it did not. The pain is better now than it was over the last couple days but he still concerned about it. Seemed to start above his umbilicus and radiated towards the right mid to lower abdomen. His never had any abdominal surgeries in the past. Patient doesn't history of atrial fibrillation states he has not taken his atenolol since possibly Saturday. He states he just forgot to take it. He takes his other medications however. Heart rate in triage was in the 120s to 130s but he does not have any chest pain or shortness of breath. His only concern is his abdominal discomfort.  He denies any other symptoms. Nothing makes the pain better and nothing makes the pain worse, radiation is as described above, sharp pain  Past Medical History:  Diagnosis Date  . A-fib ([place])   . CHF (congestive heart failure) ([place])   . Gout   . Hypertension   . Irregular heart beat     There are no active problems to display for this patient.   History reviewed. No pertinent surgical history.  Prior to Admission medications   Medication Sig Start Date End Date Taking? Authorizing Provider  oxyCODONE-acetaminophen (PERCOCET) 7.5-325 MG tablet Take 1 tablet by mouth every 6 (six) hours as needed for severe pain. 06/29/16 06/29/17  Daymon Larsen, MD  predniSONE (DELTASONE) 20 MG tablet Take 2 tablets (40 mg total) by mouth daily. 06/29/16   Daymon Larsen, MD    Allergies Cortisone  No family history on  file.  Social History Social History  Substance Use Topics  . Smoking status: Never Smoker  . Smokeless tobacco: Never Used  . Alcohol use No    Review of Systems Constitutional: No fever/chills Eyes: No visual changes. ENT: No sore throat. No stiff neck no neck pain Cardiovascular: Denies chest pain. Respiratory: Denies shortness of breath. Gastrointestinal:   no vomiting.  No diarrhea.  No constipation. Genitourinary: Negative for dysuria. Musculoskeletal: Negative lower extremity swelling Skin: Negative for rash. Neurological: Negative for severe headaches, focal weakness or numbness. 10-point ROS otherwise negative.  ____________________________________________   PHYSICAL EXAM:  VITAL SIGNS: ED Triage Vitals  Enc Vitals Group     BP 01/11/17 1119 (!) 152/86     Pulse Rate 01/11/17 1119 (!) 136     Resp 01/11/17 1220 17     Temp 01/11/17 1119 97.8 F (36.6 C)     Temp Source 01/11/17 1119 Oral     SpO2 01/11/17 1119 96 %     Weight 01/11/17 1119 198 lb (89.8 kg)     Height 01/11/17 1119 6\' 1"  (1.854 m)     Head Circumference --      Peak Flow --      Pain Score 01/11/17 1210 5     Pain Loc --      Pain Edu? --      Excl. in [place]? --     Constitutional: Alert and oriented. Well appearing and in no  acute distress. Eyes: Conjunctivae are normal. PERRL. EOMI. Head: Atraumatic. Nose: No congestion/rhinnorhea. Mouth/Throat: Mucous membranes are moist.  Oropharynx non-erythematous. Neck: No stridor.   Nontender with no meningismus Cardiovascular: Normal rate, regular rhythm. Grossly normal heart sounds.  Good peripheral circulation. Respiratory: Normal respiratory effort.  No retractions. Lungs CTAB. Abdominal: Soft, tenderness palpation in the right mid abdomen with voluntary and involuntary guarding, no rebound tenderness, possible early peritoneal signs however are present. Pain is just to the right of the umbilicus. Is not truly in McBurney's point nor is it  truly at Murphy's region. No distention. No guarding no rebound Back:  There is no focal tenderness or step off.  there is no midline tenderness there are no lesions noted. there is no CVA tenderness Musculoskeletal: No lower extremity tenderness, no upper extremity tenderness. No joint effusions, no DVT signs strong distal pulses no edema Neurologic:  Normal speech and language. No gross focal neurologic deficits are appreciated.  Skin:  Skin is warm, dry and intact. No rash noted. Psychiatric: Mood and affect are normal. Speech and behavior are normal.  ____________________________________________   LABS (all labs ordered are listed, but only abnormal results are displayed)  Labs Reviewed  COMPREHENSIVE METABOLIC PANEL - Abnormal; Notable for the following:       Result Value   Glucose, Bld 125 (*)    Creatinine, Ser 1.52 (*)    ALT 12 (*)    Total Bilirubin 1.7 (*)    GFR calc non Af Amer 42 (*)    GFR calc Af Amer 49 (*)    All other components within normal limits  CBC - Abnormal; Notable for the following:    WBC 12.9 (*)    RDW 15.0 (*)    All other components within normal limits  LIPASE, BLOOD  URINALYSIS, COMPLETE (UACMP) WITH MICROSCOPIC   ____________________________________________  EKG  I personally interpreted any EKGs ordered by me or triage A. fib with rapid ventricular response at 134 no acute ST elevation or depression, there is inverted T waves laterally possibly rate related ____________________________________________  RADIOLOGY  I reviewed any imaging ordered by me or triage that were performed during my shift and, if possible, patient and/or family made aware of any abnormal findings. ____________________________________________   PROCEDURES  Procedure(s) performed: None  Procedures  Critical Care performed: None  ____________________________________________   INITIAL IMPRESSION / ASSESSMENT AND PLAN / ED COURSE  Pertinent labs & imaging  results that were available during my care of the patient were reviewed by me and considered in my medical decision making (see chart for details).  Patient here with right abdominal discomfort. Otherwise her well-appearing white count mildly elevated. His heart rate has been somewhat elevated triage here at this time it is 105. Patient did not take any of his home atenolol. Given that I think that he has possibly surgical pathology present in his abdomen, I will defer aggressive treatment of his rate with possibly blood pressure lowering medications unless his rate gets significantly higher. This time is 104 and he is asymptomatic with it. We're giving him IV fluids, we'll obtain CT to further evaluate his pain. Differential includes gallbladder or diverticular disease ischemic bowel or appendicitis among others.  ----------------------------------------- 2:00 PM on 01/11/2017 -----------------------------------------  CT shows likely cholecystitis. I will start him on antibiotics. I have discussed with general surgery, they will come evaluate the patient. Heart rate currently 97. They did ask for an ultrasound and the indirect and direct bili which we  have ordered. The patient states it's only sore when he moves around and declines pain medication at this time.  ----------------------------------------- 2:51 PM on 01/11/2017 -----------------------------------------  Seen and evaluated by Dr. Dahlia Byes, he agrees with diagnosis but he does ask for hospitalist admission given multiple comorbidities for further evaluation with possible HIDA scan prior to any operative care. He agrees with Zosyn which I have already administered. We have reviewed his CT ultrasound and other findings including blood work. Dr. Posey Pronto agrees with management and will admit    ____________________________________________   FINAL CLINICAL IMPRESSION(S) / ED DIAGNOSES  Final diagnoses:  None      This chart was  dictated using voice recognition software.  Despite best efforts to proofread,  errors can occur which can change meaning.      Schuyler Amor, MD 01/11/17 1255    Schuyler Amor, MD 01/11/17 [place], MD 01/11/17 [place], MD 01/11/17 [phone]

## 2017-01-11 NOTE — H&P (Signed)
[facility] [place] NAME: George Bolton    MR#:  [id number]  DATE OF BIRTH:  [birth year]  DATE OF ADMISSION:  01/11/2017  PRIMARY CARE PHYSICIAN: Baltazar Apo, MD   REQUESTING/REFERRING PHYSICIAN: Dr. Burlene Arnt  CHIEF COMPLAINT:   Abdominal pain since Saturday and some nausea HISTORY OF PRESENT ILLNESS:  George Bolton  is a 76 y.o. male with a known history of Chronic A. fib not on anticoagulation does not follow with cardiology, hypertension, history of CHF comes to the emergency room complains of abdominal pain more in the central epigastric around the umbilical area and with some nausea. Denies any fever or diarrhea. In the emergency room patient was found to be tachycardic initially on arrival 130s slowed down to 10 7. He had not taken his atenolol this morning. He had elevated white count of 12,000 workup in the ED showed patient has acute cholecystitis with gallstones. Patient was seen by Dr. Dahlia Byes from general surgery recommends medicine to admit patient given his rapid A. Fib and other medical problems. Patient is being admitted for above symptoms. He does not have a cardiologist as outpatient.   PAST MEDICAL HISTORY:   Past Medical History:  Diagnosis Date  . A-fib ([place])   . CHF (congestive heart failure) ([place])   . Gout   . Hypertension   . Irregular heart beat     PAST SURGICAL HISTOIRY:  History reviewed. No pertinent surgical history.  SOCIAL HISTORY:   Social History  Substance Use Topics  . Smoking status: Never Smoker  . Smokeless tobacco: Never Used  . Alcohol use No    FAMILY HISTORY:  No family history on file.  DRUG ALLERGIES:   Allergies  Allergen Reactions  . Cortisone     REVIEW OF SYSTEMS:  Review of Systems  Constitutional: Negative for chills, fever and weight loss.  HENT: Negative for ear discharge, ear pain and nosebleeds.   Eyes: Negative for blurred vision, pain and discharge.   Respiratory: Negative for sputum production, shortness of breath, wheezing and stridor.   Cardiovascular: Negative for chest pain, palpitations, orthopnea and PND.  Gastrointestinal: Negative for abdominal pain, diarrhea, nausea and vomiting.  Genitourinary: Negative for frequency and urgency.  Musculoskeletal: Negative for back pain and joint pain.  Neurological: Negative for sensory change, speech change, focal weakness and weakness.  Psychiatric/Behavioral: Negative for depression and hallucinations. The patient is not nervous/anxious.      MEDICATIONS AT HOME:   Prior to Admission medications   Medication Sig Start Date End Date Taking? Authorizing Provider  aspirin 325 MG tablet Take 325 mg by mouth daily.   Yes Historical Provider, MD  atenolol (TENORMIN) 50 MG tablet Take 1 tablet by mouth daily. 01/09/17  Yes Historical Provider, MD  atorvastatin (LIPITOR) 40 MG tablet Take 1 tablet by mouth daily.   Yes Historical Provider, MD  felodipine (PLENDIL) 10 MG 24 hr tablet Take 1 tablet by mouth daily.   Yes Historical Provider, MD  furosemide (LASIX) 40 MG tablet Take 1 tablet by mouth as needed.    Yes Historical Provider, MD  hydrALAZINE (APRESOLINE) 50 MG tablet Take 1 tablet by mouth 3 (three) times daily. 12/26/16  Yes Historical Provider, MD      VITAL SIGNS:  Blood pressure 139/89, pulse 98, temperature 97.8 F (36.6 C), temperature source Oral, resp. rate 19, height 6\' 1"  (1.854 m), weight 89.8 kg (198 lb), SpO2 94 %.  PHYSICAL EXAMINATION:  GENERAL:  76 y.o.-year-old patient lying in the bed with no acute distress. Obese  EYES: Pupils equal, round, reactive to light and accommodation. No scleral icterus. Extraocular muscles intact.  HEENT: Head atraumatic, normocephalic. Oropharynx and nasopharynx clear.  NECK:  Supple, no jugular venous distention. No thyroid enlargement, no tenderness.  LUNGS: Normal breath sounds bilaterally, no wheezing, rales,rhonchi or crepitation. No  use of accessory muscles of respiration.  CARDIOVASCULAR: S1, S2 normal. No murmurs, rubs, or gallops. Regular heart rhythm  ABDOMEN: Soft, nontender, nondistended. Bowel sounds present. No organomegaly or mass.  EXTREMITIES: No pedal edema, cyanosis, or clubbing.  NEUROLOGIC: Cranial nerves II through XII are intact. Muscle strength 5/5 in all extremities. Sensation intact. Gait not checked.  PSYCHIATRIC: The patient is alert and oriented x 3.  SKIN: No obvious rash, lesion, or ulcer.   LABORATORY PANEL:   CBC  Recent Labs Lab 01/11/17 1127  WBC 12.9*  HGB 15.7  HCT 46.6  PLT 192   ------------------------------------------------------------------------------------------------------------------  Chemistries   Recent Labs Lab 01/11/17 1127  NA 137  K 3.8  CL 106  CO2 22  GLUCOSE 125*  BUN 18  CREATININE 1.52*  CALCIUM 9.4  AST 24  ALT 12*  ALKPHOS 75  BILITOT 1.7*  1.7*   ------------------------------------------------------------------------------------------------------------------  Cardiac Enzymes No results for input(s): TROPONINI in the last 168 hours. ------------------------------------------------------------------------------------------------------------------  RADIOLOGY:  Ct Abdomen Pelvis W Contrast  Result Date: 01/11/2017 CLINICAL DATA:  Right lower quadrant pain for 4 days EXAM: CT ABDOMEN AND PELVIS WITH CONTRAST TECHNIQUE: Multidetector CT imaging of the abdomen and pelvis was performed using the standard protocol following bolus administration of intravenous contrast. CONTRAST:  156mL ISOVUE-300 IOPAMIDOL (ISOVUE-300) INJECTION 61% COMPARISON:  09/23/2012 FINDINGS: Lower chest: No acute abnormality. Hepatobiliary: Normal liver without a focal hepatic mass. Cholelithiasis. Severe gallbladder wall thickening. No intrahepatic or extrahepatic biliary ductal dilatation. Pancreas: Unremarkable. No pancreatic ductal dilatation or surrounding inflammatory  changes. Spleen: Normal in size without focal abnormality. Adrenals/Urinary Tract: Normal adrenal glands. Multiple hypodense, fluid attenuating bilateral renal masses most consistent with cysts. 10 mm complex cystic right upper pole renal mass with small peripheral calcification, but overall incompletely characterized. Multiple nonobstructing left nephrolithiasis. No hydronephrosis. Right renal cortical scarring anteriorly. Stomach/Bowel: Stomach is within normal limits. Appendix appears normal. No evidence of bowel wall thickening, distention, or inflammatory changes. Vascular/Lymphatic: Normal caliber abdominal aorta with atherosclerosis. No lymphadenopathy. Reproductive: Enlarged prostate gland. Other: No abdominal wall hernia or abnormality. No abdominopelvic ascites. Musculoskeletal: No acute osseous abnormality. No lytic or sclerotic osseous lesion. 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 IMPRESSION: 1. Cholelithiasis with gallbladder wall thickening concerning for acute cholecystitis. Correlate with laboratory values. 2. Normal appendix. 3. Incompletely characterized 10 mm cystic right upper pole renal mass likely reflecting a complicated cyst. 4. Enlarged prostate gland. 5. Nonobstructing left nephrolithiasis. Electronically Signed   By: Kathreen Devoid   On: 01/11/2017 13:42   US Abdomen Limited Ruq  Result Date: 01/11/2017 CLINICAL DATA:  Three-day history abdominal pain EXAM: US ABDOMEN LIMITED - RIGHT UPPER QUADRANT COMPARISON:  CT abdomen and pelvis January 11, 2017 FINDINGS: Gallbladder: Within the gallbladder, there are multiple echogenic foci which move and shadow consistent with gallstones. Largest gallstone measures 1.4 cm in length. Gallbladder wall is thickened and edematous with pericholecystic fluid. No sonographic Murphy sign noted by sonographer. Common bile duct: Diameter: 4 mm. No intrahepatic or extrahepatic biliary duct dilatation. Liver: No  focal lesion identified. Within normal limits in parenchymal echogenicity. IMPRESSION: Findings  as described above consistent with acute cholecystitis. Electronically Signed   By: Lowella Grip III M.D.   On: 01/11/2017 14:43    EKG:  Rapid A. fib with RVR.  IMPRESSION AND PLAN:   Brode Sculley  is a 76 y.o. male with a known history of Chronic A. fib not on anticoagulation does not follow with cardiology, hypertension, history of CHF comes to the emergency room complains of abdominal pain more in the central epigastric around the umbilical area and with some nausea.  1. Abdominal pain/epigastric pain secondary to acute cholecystitis/cholelithiasis. -Surgical consultation placed. Patient will get HIDA scan per surgery -IV pain medicine as needed -Empiric IV Unasyn  2. Rapid A. fib with RVR -We will continue atenolol. Heart rate is much improved. Patient initially came in with heart rate of 134. -Not on any anticoagulation. Does not follow cardiologist. -We'll order echo the heart -Cardiology consultation for A. fib and patient will need preoperative clearance if he goes for gallbladder surgery -I'm holding patient's aspirin since he may go for surgery. If there is any reason surgery is not needed resume aspirin  3. Leukocytosis continue antibiotics  4. Hypertension continue home meds  5. DVT prophylaxis subcutaneous Lovenox No family members present in the ER    All the records are reviewed and case discussed with ED provider. Management plans discussed with the patient, family and they are in agreement.  CODE STATUS: DO NOT RESUSCITATE discussed with patient at length  TOTAL TIME TAKING CARE OF THIS PATIENT: 50 mins.    Koltyn Kelsay M.D on 01/11/2017 at 3:26 PM  Between 7am to 6pm - Pager - [phone]  After 6pm go to [email or web address] - password [email or web address] [facility]  [facility]  Office  [phone]  CC: Primary care physician; Baltazar Apo, MD

## 2017-01-11 NOTE — ED Triage Notes (Signed)
States general abd pain since Saturday, denies any nausea or vomiting, states he has not taken any of his medicine since Saturday, awake and alert, denies any pain at present but states he wanted to get checked out

## 2017-01-11 NOTE — ED Notes (Signed)
Patient transported to Ultrasound 

## 2017-01-12 ENCOUNTER — Inpatient Hospital Stay: Payer: Medicare Other

## 2017-01-12 ENCOUNTER — Inpatient Hospital Stay
Admit: 2017-01-12 | Discharge: 2017-01-12 | Disposition: A | Discharge status: Home / Self Care | Payer: Medicare Other | Attending: Internal Medicine | Admitting: Internal Medicine

## 2017-01-12 DIAGNOSIS — K810 Acute cholecystitis: Secondary | ICD-10-CM

## 2017-01-12 LAB — CBC
HCT: 38.8 % — ABNORMAL LOW (ref 40.0–52.0)
Hemoglobin: 13.4 g/dL (ref 13.0–18.0)
MCH: 29.3 pg (ref 26.0–34.0)
MCHC: 34.4 g/dL (ref 32.0–36.0)
MCV: 85 fL (ref 80.0–100.0)
Platelets: 171 10*3/uL (ref 150–440)
RBC: 4.57 MIL/uL (ref 4.40–5.90)
RDW: 15.2 % — ABNORMAL HIGH (ref 11.5–14.5)
WBC: 9.2 10*3/uL (ref 3.8–10.6)

## 2017-01-12 LAB — COMPREHENSIVE METABOLIC PANEL
ALT: 11 U/L — ABNORMAL LOW (ref 17–63)
AST: 20 U/L (ref 15–41)
Albumin: 3.1 g/dL — ABNORMAL LOW (ref 3.5–5.0)
Alkaline Phosphatase: 67 U/L (ref 38–126)
Anion gap: 8 (ref 5–15)
BUN: 18 mg/dL (ref 6–20)
CO2: 23 mmol/L (ref 22–32)
Calcium: 8.9 mg/dL (ref 8.9–10.3)
Chloride: 108 mmol/L (ref 101–111)
Creatinine, Ser: 1.49 mg/dL — ABNORMAL HIGH (ref 0.61–1.24)
GFR calc Af Amer: 50 mL/min — ABNORMAL LOW (ref >60)
GFR calc non Af Amer: 44 mL/min — ABNORMAL LOW (ref >60)
Glucose, Bld: 77 mg/dL (ref 65–99)
Potassium: 3.6 mmol/L (ref 3.5–5.1)
Sodium: 139 mmol/L (ref 135–145)
Total Bilirubin: 1.4 mg/dL — ABNORMAL HIGH (ref 0.3–1.2)
Total Protein: 6.5 g/dL (ref 6.5–8.1)

## 2017-01-12 LAB — ECHOCARDIOGRAM COMPLETE
Height: 73 in
Weight: 3168 oz

## 2017-01-12 LAB — TROPONIN I: Troponin I: 0.03 ng/mL (ref <0.03)

## 2017-01-12 MED ORDER — CHLORHEXIDINE GLUCONATE CLOTH 2 % EX PADS
6.0000 | MEDICATED_PAD | Freq: Once | CUTANEOUS | Status: AC
  Administered 2017-01-12: 6 via TOPICAL

## 2017-01-12 MED ORDER — ENOXAPARIN SODIUM 40 MG/0.4ML ~~LOC~~ SOLN
40.0000 mg | SUBCUTANEOUS | Status: DC
  Administered 2017-01-12 – 2017-01-14 (×3): 40 mg via SUBCUTANEOUS
  Filled 2017-01-12 (×3): qty 0.4

## 2017-01-12 MED ORDER — CHLORHEXIDINE GLUCONATE CLOTH 2 % EX PADS: 6.0000 | MEDICATED_PAD | Freq: Once | CUTANEOUS | Status: DC

## 2017-01-12 NOTE — Plan of Care (Signed)
Problem: Nutrition: Goal: Adequate nutrition will be maintained Outcome: Progressing Reeducated the patient and he indicated that he understands the need to remain NPO

## 2017-01-12 NOTE — Progress Notes (Signed)
[facility] Encounter Note  Patient: George Bolton / Admit Date: 01/11/2017 / Date of Encounter: 01/12/2017, 7:57 AM   Subjective: Patient is overall better. Does have some abdominal discomfort but slightly improved from yesterday. No evidence of congestive heart failure or myocardial infarction. Heart rate of atrial fibrillation bladder control  Review of Systems: Positive for: Abdominal discomfort Negative for: Vision change, hearing change, syncope, dizziness, nausea, vomiting,diarrhea, bloody stool, cough, congestion, diaphoresis, urinary frequency, urinary pain,skin lesions, skin rashes Others previously listed  Objective: Telemetry: Atrial fibrillation with controlled ventricular rate Physical Exam: Blood pressure 123/68, pulse 89, temperature 98.5 F (36.9 C), temperature source Oral, resp. rate 17, height 6\' 1"  (1.854 m), weight 89.8 kg (198 lb), SpO2 97 %. Body mass index is 26.12 kg/m. General: Well developed, well nourished, in no acute distress. Head: Normocephalic, atraumatic, sclera non-icteric, no xanthomas, nares are without discharge. Neck: No apparent masses Lungs: Normal respirations with no wheezes, no rhonchi, no rales , no crackles   Heart: Irregular rate and rhythm, normal S1 S2, no murmur, no rub, no gallop, PMI is normal size and placement, carotid upstroke normal without bruit, jugular venous pressure normal Abdomen: Soft,  tender,  distended with normoactive bowel sounds. No hepatosplenomegaly. Abdominal aorta is normal size without bruit Extremities: No edema, no clubbing, no cyanosis, no ulcers,  Peripheral: 2+ radial, 2+ femoral, 2+ dorsal pedal pulses Neuro: Alert and oriented. Moves all extremities spontaneously. Psych:  Responds to questions appropriately with a normal affect.   Intake/Output Summary (Last 24 hours) at 01/12/17 0757 Last data filed at 01/12/17 0732  Gross per 24 hour  Intake              755 ml  Output               525 ml  Net              230 ml    Inpatient Medications:  . ampicillin-sulbactam (UNASYN) IV  3 g Intravenous Q6H  . atenolol  50 mg Oral Daily  . atorvastatin  40 mg Oral Daily  . enoxaparin (LOVENOX) injection  40 mg Subcutaneous Q24H  . felodipine  10 mg Oral Daily  . hydrALAZINE  50 mg Oral TID   Infusions:  . sodium chloride 75 mL/hr at 01/11/17 1800    Labs:  Recent Labs  01/11/17 1127  NA 137  K 3.8  CL 106  CO2 22  GLUCOSE 125*  BUN 18  CREATININE 1.52*  CALCIUM 9.4    Recent Labs  01/11/17 1127  AST 24  ALT 12*  ALKPHOS 75  BILITOT 1.7*  1.7*  PROT 7.8  ALBUMIN 3.9    Recent Labs  01/11/17 1127  WBC 12.9*  HGB 15.7  HCT 46.6  MCV 84.3  PLT 192    Recent Labs  01/11/17 1127  TROPONINI 0.03*   Invalid input(s): POCBNP No results for input(s): HGBA1C in the last 72 hours.   Weights: Filed Weights   01/11/17 1119  Weight: 89.8 kg (198 lb)     Radiology/Studies:  Ct Abdomen Pelvis W Contrast  Result Date: 01/11/2017 CLINICAL DATA:  Right lower quadrant pain for 4 days EXAM: CT ABDOMEN AND PELVIS WITH CONTRAST TECHNIQUE: Multidetector CT imaging of the abdomen and pelvis was performed using the standard protocol following bolus administration of intravenous contrast. CONTRAST:  141mL ISOVUE-300 IOPAMIDOL (ISOVUE-300) INJECTION 61% COMPARISON:  09/23/2012 FINDINGS: Lower chest: No acute abnormality. Hepatobiliary: Normal liver without  a focal hepatic mass. Cholelithiasis. Severe gallbladder wall thickening. No intrahepatic or extrahepatic biliary ductal dilatation. Pancreas: Unremarkable. No pancreatic ductal dilatation or surrounding inflammatory changes. Spleen: Normal in size without focal abnormality. Adrenals/Urinary Tract: Normal adrenal glands. Multiple hypodense, fluid attenuating bilateral renal masses most consistent with cysts. 10 mm complex cystic right upper pole renal mass with small peripheral calcification, but overall  incompletely characterized. Multiple nonobstructing left nephrolithiasis. No hydronephrosis. Right renal cortical scarring anteriorly. Stomach/Bowel: Stomach is within normal limits. Appendix appears normal. No evidence of bowel wall thickening, distention, or inflammatory changes. Vascular/Lymphatic: Normal caliber abdominal aorta with atherosclerosis. No lymphadenopathy. Reproductive: Enlarged prostate gland. Other: No abdominal wall hernia or abnormality. No abdominopelvic ascites. Musculoskeletal: No acute osseous abnormality. No lytic or sclerotic osseous lesion. 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 IMPRESSION: 1. Cholelithiasis with gallbladder wall thickening concerning for acute cholecystitis. Correlate with laboratory values. 2. Normal appendix. 3. Incompletely characterized 10 mm cystic right upper pole renal mass likely reflecting a complicated cyst. 4. Enlarged prostate gland. 5. Nonobstructing left nephrolithiasis. Electronically Signed   By: Kathreen Devoid   On: 01/11/2017 13:42   US Abdomen Limited Ruq  Result Date: 01/11/2017 CLINICAL DATA:  Three-day history abdominal pain EXAM: US ABDOMEN LIMITED - RIGHT UPPER QUADRANT COMPARISON:  CT abdomen and pelvis January 11, 2017 FINDINGS: Gallbladder: Within the gallbladder, there are multiple echogenic foci which move and shadow consistent with gallstones. Largest gallstone measures 1.4 cm in length. Gallbladder wall is thickened and edematous with pericholecystic fluid. No sonographic Murphy sign noted by sonographer. Common bile duct: Diameter: 4 mm. No intrahepatic or extrahepatic biliary duct dilatation. Liver: No focal lesion identified. Within normal limits in parenchymal echogenicity. IMPRESSION: Findings as described above consistent with acute cholecystitis. Electronically Signed   By: Lowella Grip III M.D.   On: 01/11/2017 14:43     Assessment and Recommendation  76 y.o. male with essential  hypertension mixed hyperlipidemia cardiovascular disease with chronic Nonvalvular atrial fibrillation with rapid ventricular rate likely secondary to current illness and abdominal pain and lack of use of beta blocker without evidence of myocardial infarction or congestive heart failure 1. Continue atenolol for heart rate control of atrial fibrillation with a goal heart rate between 60 and 90 bpm 2. No anticoagulation at this time for further risk reduction of stroke due to patient's wishes 3. High intensity cholesterol therapy with atorvastatin 4. Furosemide for lower extremity edema and diastolic dysfunction heart failure 5. Hypertension control with felodipine 6. Begin ambulation following for improvements of symptoms and need for further gastric intervention  Signed, Serafina Royals M.D. FACC

## 2017-01-12 NOTE — Progress Notes (Signed)
[facility] NAME: George Bolton    MR#:  [id number]  DATE OF BIRTH:  16-Apr-1941  SUBJECTIVE:  CHIEF COMPLAINT:   Chief Complaint  Patient presents with  . Abdominal Pain  pt came with abd pain, and found to have ac cholecystitis, admitted to medicine due to tachycardia with A fib.now rate is controlled  REVIEW OF SYSTEMS:  CONSTITUTIONAL: No fever, fatigue or weakness.  EYES: No blurred or double vision.  EARS, NOSE, AND THROAT: No tinnitus or ear pain.  RESPIRATORY: No cough, shortness of breath, wheezing or hemoptysis.  CARDIOVASCULAR: No chest pain, orthopnea, edema.  GASTROINTESTINAL: No nausea, vomiting, diarrhea or abdominal pain.  GENITOURINARY: No dysuria, hematuria.  ENDOCRINE: No polyuria, nocturia,  HEMATOLOGY: No anemia, easy bruising or bleeding SKIN: No rash or lesion. MUSCULOSKELETAL: No joint pain or arthritis.   NEUROLOGIC: No tingling, numbness, weakness.  PSYCHIATRY: No anxiety or depression.   ROS  DRUG ALLERGIES:   Allergies  Allergen Reactions  . Cortisone     VITALS:  Blood pressure 130/61, pulse 94, temperature 98.1 F (36.7 C), temperature source Oral, resp. rate 18, height 6\' 1"  (1.854 m), weight 89.8 kg (198 lb), SpO2 95 %.  PHYSICAL EXAMINATION:  GENERAL:  76 y.o.-year-old patient lying in the bed with no acute distress.  EYES: Pupils equal, round, reactive to light and accommodation. No scleral icterus. Extraocular muscles intact.  HEENT: Head atraumatic, normocephalic. Oropharynx and nasopharynx clear.  NECK:  Supple, no jugular venous distention. No thyroid enlargement, no tenderness.  LUNGS: Normal breath sounds bilaterally, no wheezing, rales,rhonchi or crepitation. No use of accessory muscles of respiration.  CARDIOVASCULAR: S1, S2 normal. No murmurs, rubs, or gallops.  ABDOMEN: Soft, RUQ tender, nondistended. Bowel sounds present. No organomegaly or mass.  EXTREMITIES: No pedal  edema, cyanosis, or clubbing.  NEUROLOGIC: Cranial nerves II through XII are intact. Muscle strength 5/5 in all extremities. Sensation intact. Gait not checked.  PSYCHIATRIC: The patient is alert and oriented x 3.  SKIN: No obvious rash, lesion, or ulcer.   Physical Exam LABORATORY PANEL:   CBC  Recent Labs Lab 01/12/17 0904  WBC 9.2  HGB 13.4  HCT 38.8*  PLT 171   ------------------------------------------------------------------------------------------------------------------  Chemistries   Recent Labs Lab 01/12/17 0904  NA 139  K 3.6  CL 108  CO2 23  GLUCOSE 77  BUN 18  CREATININE 1.49*  CALCIUM 8.9  AST 20  ALT 11*  ALKPHOS 67  BILITOT 1.4*   ------------------------------------------------------------------------------------------------------------------  Cardiac Enzymes  Recent Labs Lab 01/11/17 1127  TROPONINI 0.03*   ------------------------------------------------------------------------------------------------------------------  RADIOLOGY:  Ct Abdomen Pelvis W Contrast  Result Date: 01/11/2017 CLINICAL DATA:  Right lower quadrant pain for 4 days EXAM: CT ABDOMEN AND PELVIS WITH CONTRAST TECHNIQUE: Multidetector CT imaging of the abdomen and pelvis was performed using the standard protocol following bolus administration of intravenous contrast. CONTRAST:  165mL ISOVUE-300 IOPAMIDOL (ISOVUE-300) INJECTION 61% COMPARISON:  09/23/2012 FINDINGS: Lower chest: No acute abnormality. Hepatobiliary: Normal liver without a focal hepatic mass. Cholelithiasis. Severe gallbladder wall thickening. No intrahepatic or extrahepatic biliary ductal dilatation. Pancreas: Unremarkable. No pancreatic ductal dilatation or surrounding inflammatory changes. Spleen: Normal in size without focal abnormality. Adrenals/Urinary Tract: Normal adrenal glands. Multiple hypodense, fluid attenuating bilateral renal masses most consistent with cysts. 10 mm complex cystic right upper pole  renal mass with small peripheral calcification, but overall incompletely characterized. Multiple nonobstructing left nephrolithiasis. No hydronephrosis. Right renal cortical scarring anteriorly. Stomach/Bowel: Stomach  is within normal limits. Appendix appears normal. No evidence of bowel wall thickening, distention, or inflammatory changes. Vascular/Lymphatic: Normal caliber abdominal aorta with atherosclerosis. No lymphadenopathy. Reproductive: Enlarged prostate gland. Other: No abdominal wall hernia or abnormality. No abdominopelvic ascites. Musculoskeletal: No acute osseous abnormality. No lytic or sclerotic osseous lesion. 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 IMPRESSION: 1. Cholelithiasis with gallbladder wall thickening concerning for acute cholecystitis. Correlate with laboratory values. 2. Normal appendix. 3. Incompletely characterized 10 mm cystic right upper pole renal mass likely reflecting a complicated cyst. 4. Enlarged prostate gland. 5. Nonobstructing left nephrolithiasis. Electronically Signed   By: Kathreen Devoid   On: 01/11/2017 13:42   US Venous Img Lower Bilateral  Result Date: 01/12/2017 CLINICAL DATA:  Left lower extremity edema EXAM: BILATERAL LOWER EXTREMITY VENOUS DOPPLER ULTRASOUND TECHNIQUE: Gray-scale sonography with graded compression, as well as color Doppler and duplex ultrasound were performed to evaluate the lower extremity deep venous systems from the level of the common femoral vein and including the common femoral, femoral, profunda femoral, popliteal and calf veins including the posterior tibial, peroneal and gastrocnemius veins when visible. The superficial great saphenous vein was also interrogated. Spectral Doppler was utilized to evaluate flow at rest and with distal augmentation maneuvers in the common femoral, femoral and popliteal veins. COMPARISON:  None. FINDINGS: RIGHT LOWER EXTREMITY Common Femoral Vein: No evidence of  thrombus. Normal compressibility, respiratory phasicity and response to augmentation. Saphenofemoral Junction: No evidence of thrombus. Normal compressibility and flow on color Doppler imaging. Profunda Femoral Vein: No evidence of thrombus. Normal compressibility and flow on color Doppler imaging. Femoral Vein: No evidence of thrombus. Normal compressibility, respiratory phasicity and response to augmentation. Popliteal Vein: No evidence of thrombus. Normal compressibility, respiratory phasicity and response to augmentation. Calf Veins: No evidence of thrombus. Normal compressibility and flow on color Doppler imaging. Superficial Great Saphenous Vein: No evidence of thrombus. Normal compressibility and flow on color Doppler imaging. Venous Reflux:  None. Other Findings:  None. LEFT LOWER EXTREMITY Common Femoral Vein: No evidence of thrombus. Normal compressibility, respiratory phasicity and response to augmentation. Saphenofemoral Junction: No evidence of thrombus. Normal compressibility and flow on color Doppler imaging. Profunda Femoral Vein: No evidence of thrombus. Normal compressibility and flow on color Doppler imaging. Femoral Vein: No evidence of thrombus. Normal compressibility, respiratory phasicity and response to augmentation. Popliteal Vein: No evidence of thrombus. Normal compressibility, respiratory phasicity and response to augmentation. Calf Veins: No evidence of thrombus. Normal compressibility and flow on color Doppler imaging. Superficial Great Saphenous Vein: No evidence of thrombus. Normal compressibility and flow on color Doppler imaging. Venous Reflux:  None. Other Findings:  None. IMPRESSION: No evidence of deep venous thrombosis. Electronically Signed   By: Inez Catalina M.D.   On: 01/12/2017 16:29   US Abdomen Limited Ruq  Result Date: 01/11/2017 CLINICAL DATA:  Three-day history abdominal pain EXAM: US ABDOMEN LIMITED - RIGHT UPPER QUADRANT COMPARISON:  CT abdomen and pelvis January 11, 2017 FINDINGS: Gallbladder: Within the gallbladder, there are multiple echogenic foci which move and shadow consistent with gallstones. Largest gallstone measures 1.4 cm in length. Gallbladder wall is thickened and edematous with pericholecystic fluid. No sonographic Murphy sign noted by sonographer. Common bile duct: Diameter: 4 mm. No intrahepatic or extrahepatic biliary duct dilatation. Liver: No focal lesion identified. Within normal limits in parenchymal echogenicity. IMPRESSION: Findings as described above consistent with acute cholecystitis. Electronically Signed   By: Lowella Grip III M.D.   On: 01/11/2017 14:43  ASSESSMENT AND PLAN:   Active Problems:   Acute cholecystitis  1. Abdominal pain/epigastric pain secondary to acute cholecystitis/cholelithiasis. -Surgical consultation appreciated, likely sx tomorrow. -IV pain medicine as needed -Empiric IV Unasyn  2. Rapid A. fib with RVR - continue atenolol. Heart rate is much improved.  -Not on any anticoagulation. Does not follow cardiologist. - echo the heart -Cardiology consultation for A. fib and cleared for gallbladder surgery -I'm holding patient's aspirin since he may go for surgery. If there is any reason surgery is not needed resume aspirin  3. Leukocytosis continue antibiotics  4. Hypertension continue home meds  5. DVT prophylaxis subcutaneous Lovenox     All the records are reviewed and case discussed with Care Management/Social Workerr. Management plans discussed with the patient, family and they are in agreement.  CODE STATUS: full.  TOTAL TIME TAKING CARE OF THIS PATIENT: 35 minutes.    POSSIBLE D/C IN 2 DAYS, DEPENDING ON CLINICAL CONDITION.   Vaughan Basta M.D on 01/12/2017   Between 7am to 6pm - Pager - [phone]  After 6pm go to [email or web address] - password [email or web address] [facility]  Office  [phone]  CC: Primary care physician; Baltazar Apo, MD  Note:  This dictation was prepared with Dragon dictation along with smaller phrase technology. Any transcriptional errors that result from this process are unintentional.

## 2017-01-12 NOTE — Progress Notes (Signed)
Acute cholecystitis  CC:  Subjective: Appreciate cardiology consult. Had a lengthy discussion with Dr. Nehemiah Massed about his cardiac process. He does not believe that he is at high risk for surgical intervention. Echocardiogram showed preserved ejection fraction and now he's afebrile is rate controlled. He has persistent RUQ pain  Objective: Vital signs in last 24 hours: Temp:  [97.6 F (36.4 C)-98.8 F (37.1 C)] 97.6 F (36.4 C) (03/20 0931) Pulse Rate:  [80-123] 91 (03/20 0931) Resp:  [10-20] 18 (03/20 0931) BP: (123-161)/(68-94) 139/94 (03/20 0931) SpO2:  [92 %-98 %] 98 % (03/20 0931) Last BM Date: 01/11/17  Intake/Output from previous day: 03/19 0701 - 03/20 0700 In: 755 [I.V.:755] Out: 525 [Urine:525] Intake/Output this shift: Total I/O In: 0  Out: 200 [Urine:200]  Physical exam:Elderly male in NAD Chest: irregular rhythm,  Rate controlled Abd: soft, + murphy, No peritonitis Ext: left LE edema, right w trace edema  Lab Results: CBC   Recent Labs  01/11/17 1127 01/12/17 0904  WBC 12.9* 9.2  HGB 15.7 13.4  HCT 46.6 38.8*  PLT 192 171   BMET  Recent Labs  01/11/17 1127 01/12/17 0904  NA 137 139  K 3.8 3.6  CL 106 108  CO2 22 23  GLUCOSE 125* 77  BUN 18 18  CREATININE 1.52* 1.49*  CALCIUM 9.4 8.9   PT/INR  Recent Labs  01/11/17 1301  LABPROT 14.9  INR 1.16   ABG No results for input(s): PHART, HCO3 in the last 72 hours.  Invalid input(s): PCO2, PO2  Studies/Results: Ct Abdomen Pelvis W Contrast  Result Date: 01/11/2017 CLINICAL DATA:  Right lower quadrant pain for 4 days EXAM: CT ABDOMEN AND PELVIS WITH CONTRAST TECHNIQUE: Multidetector CT imaging of the abdomen and pelvis was performed using the standard protocol following bolus administration of intravenous contrast. CONTRAST:  120mL ISOVUE-300 IOPAMIDOL (ISOVUE-300) INJECTION 61% COMPARISON:  09/23/2012 FINDINGS: Lower chest: No acute abnormality. Hepatobiliary: Normal liver without a focal  hepatic mass. Cholelithiasis. Severe gallbladder wall thickening. No intrahepatic or extrahepatic biliary ductal dilatation. Pancreas: Unremarkable. No pancreatic ductal dilatation or surrounding inflammatory changes. Spleen: Normal in size without focal abnormality. Adrenals/Urinary Tract: Normal adrenal glands. Multiple hypodense, fluid attenuating bilateral renal masses most consistent with cysts. 10 mm complex cystic right upper pole renal mass with small peripheral calcification, but overall incompletely characterized. Multiple nonobstructing left nephrolithiasis. No hydronephrosis. Right renal cortical scarring anteriorly. Stomach/Bowel: Stomach is within normal limits. Appendix appears normal. No evidence of bowel wall thickening, distention, or inflammatory changes. Vascular/Lymphatic: Normal caliber abdominal aorta with atherosclerosis. No lymphadenopathy. Reproductive: Enlarged prostate gland. Other: No abdominal wall hernia or abnormality. No abdominopelvic ascites. Musculoskeletal: No acute osseous abnormality. No lytic or sclerotic osseous lesion. 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 IMPRESSION: 1. Cholelithiasis with gallbladder wall thickening concerning for acute cholecystitis. Correlate with laboratory values. 2. Normal appendix. 3. Incompletely characterized 10 mm cystic right upper pole renal mass likely reflecting a complicated cyst. 4. Enlarged prostate gland. 5. Nonobstructing left nephrolithiasis. Electronically Signed   By: Kathreen Devoid   On: 01/11/2017 13:42   US Abdomen Limited Ruq  Result Date: 01/11/2017 CLINICAL DATA:  Three-day history abdominal pain EXAM: US ABDOMEN LIMITED - RIGHT UPPER QUADRANT COMPARISON:  CT abdomen and pelvis January 11, 2017 FINDINGS: Gallbladder: Within the gallbladder, there are multiple echogenic foci which move and shadow consistent with gallstones. Largest gallstone measures 1.4 cm in length. Gallbladder wall  is thickened and edematous with pericholecystic fluid. No sonographic Percell Miller  sign noted by sonographer. Common bile duct: Diameter: 4 mm. No intrahepatic or extrahepatic biliary duct dilatation. Liver: No focal lesion identified. Within normal limits in parenchymal echogenicity. IMPRESSION: Findings as described above consistent with acute cholecystitis. Electronically Signed   By: Lowella Grip III M.D.   On: 01/11/2017 14:43    Anti-infectives: Anti-infectives    Start     Dose/Rate Route Frequency Ordered Stop   01/11/17 2000  Ampicillin-Sulbactam (UNASYN) 3 g in sodium chloride 0.9 % 100 mL IVPB     3 g 200 mL/hr over 30 Minutes Intravenous Every 6 hours 01/11/17 1515     01/11/17 1400  piperacillin-tazobactam (ZOSYN) IVPB 3.375 g     3.375 g 100 mL/hr over 30 Minutes Intravenous  Once 01/11/17 1349 01/11/17 1510      Assessment/Plan: Acute cholecystitis now with improvement of his HR Cards considered he is optimized from CV perspective and no furhter testing is needed. Scars with the patient about the options of proceeding to the operating room for a laparoscopic cholecystectomy versus cholecystostomy tube. Given that his heart rate has improved and his function is. I think that might be his best interest to proceed with laparoscopic cholecystectomy and avoid 2 interventions. Discussed with the patient in detail about the risk, benefits and possible complications of both laparoscopic cholecystectomy and cholecystostomy tube. He understands and wishes to proceed with laparoscopic cholecystectomy. We will schedule him for a laparoscopic cholecystectomy in the morning The risks, benefits, complications, treatment options, and expected outcomes were discussed with the patient. The possibilities of bleeding, recurrent infection, finding a normal gallbladder, perforation of viscus organs, damage to surrounding structures, bile leak, abscess formation, needing a drain placed, the need for  additional procedures, reaction to medication, pulmonary aspiration,  failure to diagnose a condition, the possible need to convert to an open procedure, and creating a complication requiring transfusion or operation were discussed with the patient. The patient and/or family concurred with the proposed plan, giving informed consent. Caroleen Hamman, MD, [facility]  01/12/2017

## 2017-01-12 NOTE — Progress Notes (Signed)
Dr. Estanislado Pandy notified of troponin of 0.03; acknowledged. No new orders. Barbaraann Faster, RN 6:38 AM 01/12/2017

## 2017-01-12 NOTE — Progress Notes (Signed)
*  PRELIMINARY RESULTS* Echocardiogram 2D Echocardiogram has been performed.  Sherrie Sport 01/12/2017, 8:52 AM

## 2017-01-13 ENCOUNTER — Encounter
Admission: EM | Disposition: A | Discharge status: Home Health | Payer: Self-pay | Source: Home / Self Care | Attending: Internal Medicine

## 2017-01-13 ENCOUNTER — Inpatient Hospital Stay: Payer: Medicare Other | Admitting: Anesthesiology

## 2017-01-13 HISTORY — PX: CHOLECYSTECTOMY: SHX55

## 2017-01-13 SURGERY — LAPAROSCOPIC CHOLECYSTECTOMY
Anesthesia: General | Site: Abdomen | Wound class: Dirty or Infected

## 2017-01-13 MED ORDER — ROCURONIUM BROMIDE 100 MG/10ML IV SOLN
INTRAVENOUS | Status: DC | PRN
  Administered 2017-01-13: 40 mg via INTRAVENOUS

## 2017-01-13 MED ORDER — FENTANYL CITRATE (PF) 100 MCG/2ML IJ SOLN: 25.0000 ug | INTRAMUSCULAR | Status: DC | PRN

## 2017-01-13 MED ORDER — ASPIRIN 325 MG PO TABS
325.0000 mg | ORAL_TABLET | Freq: Every day | ORAL | Status: DC
  Administered 2017-01-14 – 2017-01-15 (×2): 325 mg via ORAL
  Filled 2017-01-13 (×2): qty 1

## 2017-01-13 MED ORDER — PROPOFOL 10 MG/ML IV BOLUS
INTRAVENOUS | Status: AC
  Filled 2017-01-13: qty 20

## 2017-01-13 MED ORDER — SEVOFLURANE IN SOLN
RESPIRATORY_TRACT | Status: AC
Start: 2017-01-13 — End: 2017-01-13
  Filled 2017-01-13: qty 250

## 2017-01-13 MED ORDER — LIDOCAINE HCL (CARDIAC) 20 MG/ML IV SOLN
INTRAVENOUS | Status: DC | PRN
  Administered 2017-01-13: 30 mg via INTRAVENOUS

## 2017-01-13 MED ORDER — ONDANSETRON HCL 4 MG/2ML IJ SOLN
INTRAMUSCULAR | Status: DC | PRN
  Administered 2017-01-13: 4 mg via INTRAVENOUS

## 2017-01-13 MED ORDER — SODIUM CHLORIDE 0.9 % IR SOLN
Status: DC | PRN
  Administered 2017-01-13: 1500 mL/h

## 2017-01-13 MED ORDER — MIDAZOLAM HCL 2 MG/2ML IJ SOLN
INTRAMUSCULAR | Status: AC
  Filled 2017-01-13: qty 2

## 2017-01-13 MED ORDER — BUPIVACAINE-EPINEPHRINE 0.25% -1:200000 IJ SOLN
INTRAMUSCULAR | Status: DC | PRN
  Administered 2017-01-13: 10 mL

## 2017-01-13 MED ORDER — ONDANSETRON HCL 4 MG/2ML IJ SOLN
INTRAMUSCULAR | Status: AC
  Filled 2017-01-13: qty 2

## 2017-01-13 MED ORDER — ROCURONIUM BROMIDE 50 MG/5ML IV SOLN
INTRAVENOUS | Status: AC
  Filled 2017-01-13: qty 1

## 2017-01-13 MED ORDER — FENTANYL CITRATE (PF) 250 MCG/5ML IJ SOLN
INTRAMUSCULAR | Status: AC
  Filled 2017-01-13: qty 5

## 2017-01-13 MED ORDER — SUCCINYLCHOLINE CHLORIDE 20 MG/ML IJ SOLN
INTRAMUSCULAR | Status: AC
  Filled 2017-01-13: qty 1

## 2017-01-13 MED ORDER — SUGAMMADEX SODIUM 200 MG/2ML IV SOLN
INTRAVENOUS | Status: AC
  Filled 2017-01-13: qty 2

## 2017-01-13 MED ORDER — ONDANSETRON HCL 4 MG/2ML IJ SOLN
4.0000 mg | Freq: Once | INTRAMUSCULAR | Status: DC | PRN
Start: 2017-01-13 — End: 2017-01-13

## 2017-01-13 MED ORDER — LIDOCAINE HCL (PF) 2 % IJ SOLN
INTRAMUSCULAR | Status: AC
  Filled 2017-01-13: qty 2

## 2017-01-13 MED ORDER — SUGAMMADEX SODIUM 200 MG/2ML IV SOLN
INTRAVENOUS | Status: DC | PRN
  Administered 2017-01-13: 179.6 mg via INTRAVENOUS

## 2017-01-13 MED ORDER — PROPOFOL 10 MG/ML IV BOLUS
INTRAVENOUS | Status: DC | PRN
  Administered 2017-01-13: 160 mg via INTRAVENOUS

## 2017-01-13 MED ORDER — FENTANYL CITRATE (PF) 100 MCG/2ML IJ SOLN
INTRAMUSCULAR | Status: DC | PRN
  Administered 2017-01-13 (×2): 50 ug via INTRAVENOUS
  Administered 2017-01-13: 100 ug via INTRAVENOUS

## 2017-01-13 SURGICAL SUPPLY — 52 items
APPLICATOR COTTON TIP 6IN STRL (MISCELLANEOUS) ×2 IMPLANT
APPLIER CLIP 5 13 M/L LIGAMAX5 (MISCELLANEOUS)
BLADE SURG 15 STRL LF DISP TIS (BLADE) IMPLANT
BLADE SURG 15 STRL SS (BLADE)
CANISTER SUCT 1200ML W/VALVE (MISCELLANEOUS) ×4 IMPLANT
CHLORAPREP W/TINT 26ML (MISCELLANEOUS) ×2 IMPLANT
CHOLANGIOGRAM CATH TAUT (CATHETERS) IMPLANT
CLEANER CAUTERY TIP 5X5 PAD (MISCELLANEOUS) ×1 IMPLANT
CLIP APPLIE 5 13 M/L LIGAMAX5 (MISCELLANEOUS) IMPLANT
DECANTER SPIKE VIAL GLASS SM (MISCELLANEOUS) ×2 IMPLANT
DEVICE TROCAR PUNCTURE CLOSURE (ENDOMECHANICALS) IMPLANT
DRAPE C-ARM XRAY 36X54 (DRAPES) IMPLANT
ELECT CAUTERY BLADE 6.4 (BLADE) ×2 IMPLANT
ELECT REM PT RETURN 9FT ADLT (ELECTROSURGICAL) ×2
ELECTRODE REM PT RTRN 9FT ADLT (ELECTROSURGICAL) ×1 IMPLANT
ENDOPOUCH RETRIEVER 10 (MISCELLANEOUS) ×2 IMPLANT
GLOVE BIO SURGEON STRL SZ7 (GLOVE) ×2 IMPLANT
GOWN STRL REUS W/ TWL LRG LVL3 (GOWN DISPOSABLE) ×3 IMPLANT
GOWN STRL REUS W/TWL LRG LVL3 (GOWN DISPOSABLE) ×3
HEMOSTAT SURGICEL 2X3 (HEMOSTASIS) ×2 IMPLANT
IRRIGATION STRYKERFLOW (MISCELLANEOUS) ×1 IMPLANT
IRRIGATOR STRYKERFLOW (MISCELLANEOUS) ×2
IV CATH ANGIO 12GX3 LT BLUE (NEEDLE) IMPLANT
IV NS 1000ML (IV SOLUTION) ×1
IV NS 1000ML BAXH (IV SOLUTION) ×1 IMPLANT
JACKSON PRATT 10 (INSTRUMENTS) ×2 IMPLANT
L-HOOK LAP DISP 36CM (ELECTROSURGICAL)
LHOOK LAP DISP 36CM (ELECTROSURGICAL) IMPLANT
LIQUID BAND (GAUZE/BANDAGES/DRESSINGS) IMPLANT
NEEDLE HYPO 22GX1.5 SAFETY (NEEDLE) ×2 IMPLANT
PACK LAP CHOLECYSTECTOMY (MISCELLANEOUS) ×2 IMPLANT
PAD CLEANER CAUTERY TIP 5X5 (MISCELLANEOUS) ×1
PENCIL ELECTRO HAND CTR (MISCELLANEOUS) ×2 IMPLANT
SCISSORS METZENBAUM CVD 33 (INSTRUMENTS) ×2 IMPLANT
SLEEVE ENDOPATH XCEL 5M (ENDOMECHANICALS) ×4 IMPLANT
SOL ANTI-FOG 6CC FOG-OUT (MISCELLANEOUS) IMPLANT
SOL FOG-OUT ANTI-FOG 6CC (MISCELLANEOUS)
SPONGE LAP 18X18 5 PK (GAUZE/BANDAGES/DRESSINGS) ×2 IMPLANT
STAPLER SKIN PROX 35W (STAPLE) ×2 IMPLANT
STOPCOCK 3 WAY  REPLAC (MISCELLANEOUS) IMPLANT
SUT ETHIBOND 0 MO6 C/R (SUTURE) IMPLANT
SUT ETHILON 3-0 FS-10 30 BLK (SUTURE) ×2
SUT MNCRL AB 4-0 PS2 18 (SUTURE) ×2 IMPLANT
SUT VIC AB 0 CT2 27 (SUTURE) IMPLANT
SUT VICRYL 0 AB UR-6 (SUTURE) ×4 IMPLANT
SUTURE EHLN 3-0 FS-10 30 BLK (SUTURE) ×1 IMPLANT
SYR 20CC LL (SYRINGE) ×2 IMPLANT
TROCAR XCEL BLUNT TIP 100MML (ENDOMECHANICALS) ×2 IMPLANT
TROCAR XCEL NON-BLD 11X100MML (ENDOMECHANICALS) ×2 IMPLANT
TROCAR XCEL NON-BLD 5MMX100MML (ENDOMECHANICALS) ×2 IMPLANT
TUBING INSUFFLATOR HI FLOW (MISCELLANEOUS) ×2 IMPLANT
WATER STERILE IRR 1000ML POUR (IV SOLUTION) ×2 IMPLANT

## 2017-01-13 NOTE — Progress Notes (Signed)
[facility] [place] NAME: George Bolton    MR#:  [id number]  DATE OF BIRTH:  Sep 18, 1941  SUBJECTIVE:  pt came with abd pain, and found to have ac cholecystitis, admitted to medicine due to tachycardia with A fib.now rate is controlled Denies any complaints today. Awaiting surgery. REVIEW OF SYSTEMS:  CONSTITUTIONAL: No fever, fatigue or weakness.  EYES: No blurred or double vision.  EARS, NOSE, AND THROAT: No tinnitus or ear pain.  RESPIRATORY: No cough, shortness of breath, wheezing or hemoptysis.  CARDIOVASCULAR: No chest pain, orthopnea, edema.  GASTROINTESTINAL: No nausea, vomiting, diarrhea or abdominal pain.  GENITOURINARY: No dysuria, hematuria.  ENDOCRINE: No polyuria, nocturia,  HEMATOLOGY: No anemia, easy bruising or bleeding SKIN: No rash or lesion. MUSCULOSKELETAL: No joint pain or arthritis.   NEUROLOGIC: No tingling, numbness, weakness.  PSYCHIATRY: No anxiety or depression.   Review of Systems  Constitutional: Negative for chills, fever and weight loss.  HENT: Negative for ear discharge, ear pain and nosebleeds.   Eyes: Negative for blurred vision, pain and discharge.  Respiratory: Negative for sputum production, shortness of breath, wheezing and stridor.   Cardiovascular: Negative for chest pain, palpitations, orthopnea and PND.  Gastrointestinal: Negative for abdominal pain, diarrhea, nausea and vomiting.  Genitourinary: Negative for frequency and urgency.  Musculoskeletal: Negative for back pain and joint pain.  Neurological: Negative for sensory change, speech change, focal weakness and weakness.  Psychiatric/Behavioral: Negative for depression and hallucinations. The patient is not nervous/anxious.     DRUG ALLERGIES:   Allergies  Allergen Reactions  . Cortisone     VITALS:  Blood pressure 125/78, pulse 79, temperature 97.8 F (36.6 C), temperature source Oral, resp. rate 16, height 6\' 1"  (1.854 m), weight  89.8 kg (198 lb), SpO2 96 %.  PHYSICAL EXAMINATION:  GENERAL:  76 y.o.-year-old patient lying in the bed with no acute distress.  EYES: Pupils equal, round, reactive to light and accommodation. No scleral icterus. Extraocular muscles intact.  HEENT: Head atraumatic, normocephalic. Oropharynx and nasopharynx clear.  NECK:  Supple, no jugular venous distention. No thyroid enlargement, no tenderness.  LUNGS: Normal breath sounds bilaterally, no wheezing, rales,rhonchi or crepitation. No use of accessory muscles of respiration.  CARDIOVASCULAR: S1, S2 normal. No murmurs, rubs, or gallops.  ABDOMEN: Soft, RUQ tender, nondistended. Bowel sounds present. No organomegaly or mass.  EXTREMITIES: No pedal edema, cyanosis, or clubbing.  NEUROLOGIC: Cranial nerves II through XII are intact. Muscle strength 5/5 in all extremities. Sensation intact. Gait not checked.  PSYCHIATRIC: The patient is alert and oriented x 3.  SKIN: No obvious rash, lesion, or ulcer.   Physical Exam  Constitutional: He is oriented to person, place, and time and well-developed, well-nourished, and in no distress.  HENT:  Head: Normocephalic.  Mouth/Throat: Oropharynx is clear and moist.  Eyes: Conjunctivae and EOM are normal. Pupils are equal, round, and reactive to light.  Neck: Normal range of motion. Neck supple.  Cardiovascular: Normal rate, regular rhythm, normal heart sounds and intact distal pulses.   Pulmonary/Chest: Effort normal and breath sounds normal.  Abdominal: Soft. Bowel sounds are normal. There is no tenderness. There is no rebound and no guarding.  Musculoskeletal: Normal range of motion.  Neurological: He is alert and oriented to person, place, and time. He displays normal reflexes. No cranial nerve deficit. He exhibits abnormal muscle tone.  Skin: Skin is warm and dry.  Psychiatric: Mood, memory, affect and judgment normal.   LABORATORY PANEL:  CBC  Recent Labs Lab 01/12/17 0904  WBC 9.2  HGB  13.4  HCT 38.8*  PLT 171   ------------------------------------------------------------------------------------------------------------------  Chemistries   Recent Labs Lab 01/12/17 0904  NA 139  K 3.6  CL 108  CO2 23  GLUCOSE 77  BUN 18  CREATININE 1.49*  CALCIUM 8.9  AST 20  ALT 11*  ALKPHOS 6  BILITOT 1.4*   ------------------------------------------------------------------------------------------------------------------  Cardiac Enzymes  Recent Labs Lab 01/11/17 1127  TROPONINI 0.03*   ------------------------------------------------------------------------------------------------------------------  RADIOLOGY:  US Venous Img Lower Bilateral  Result Date: 01/12/2017 CLINICAL DATA:  Left lower extremity edema EXAM: BILATERAL LOWER EXTREMITY VENOUS DOPPLER ULTRASOUND TECHNIQUE: Gray-scale sonography with graded compression, as well as color Doppler and duplex ultrasound were performed to evaluate the lower extremity deep venous systems from the level of the common femoral vein and including the common femoral, femoral, profunda femoral, popliteal and calf veins including the posterior tibial, peroneal and gastrocnemius veins when visible. The superficial great saphenous vein was also interrogated. Spectral Doppler was utilized to evaluate flow at rest and with distal augmentation maneuvers in the common femoral, femoral and popliteal veins. COMPARISON:  None. FINDINGS: RIGHT LOWER EXTREMITY Common Femoral Vein: No evidence of thrombus. Normal compressibility, respiratory phasicity and response to augmentation. Saphenofemoral Junction: No evidence of thrombus. Normal compressibility and flow on color Doppler imaging. Profunda Femoral Vein: No evidence of thrombus. Normal compressibility and flow on color Doppler imaging. Femoral Vein: No evidence of thrombus. Normal compressibility, respiratory phasicity and response to augmentation. Popliteal Vein: No evidence of thrombus.  Normal compressibility, respiratory phasicity and response to augmentation. Calf Veins: No evidence of thrombus. Normal compressibility and flow on color Doppler imaging. Superficial Great Saphenous Vein: No evidence of thrombus. Normal compressibility and flow on color Doppler imaging. Venous Reflux:  None. Other Findings:  None. LEFT LOWER EXTREMITY Common Femoral Vein: No evidence of thrombus. Normal compressibility, respiratory phasicity and response to augmentation. Saphenofemoral Junction: No evidence of thrombus. Normal compressibility and flow on color Doppler imaging. Profunda Femoral Vein: No evidence of thrombus. Normal compressibility and flow on color Doppler imaging. Femoral Vein: No evidence of thrombus. Normal compressibility, respiratory phasicity and response to augmentation. Popliteal Vein: No evidence of thrombus. Normal compressibility, respiratory phasicity and response to augmentation. Calf Veins: No evidence of thrombus. Normal compressibility and flow on color Doppler imaging. Superficial Great Saphenous Vein: No evidence of thrombus. Normal compressibility and flow on color Doppler imaging. Venous Reflux:  None. Other Findings:  None. IMPRESSION: No evidence of deep venous thrombosis. Electronically Signed   By: Inez Catalina M.D.   On: 01/12/2017 16:29    ASSESSMENT AND PLAN:   Active Problems:   Acute cholecystitis  1. Abdominal pain/epigastric pain secondary to acute cholecystitis/cholelithiasis. -Surgical consultation appreciatedPatient is scheduled for laparoscopic cholecystectomy today. -IV pain medicine as needed -Empiric IV Unasyn  2. Rapid A. fib with RVR - continue atenolol. Heart rate is much improved.  -Not on any anticoagulation. Does not follow cardiologist. - echo the heart -Cardiology consultation for A. fib and is cleared for gallbladder surgery -I'm holding patient's aspirin since he may go for surgery.   3. Leukocytosis continue antibiotics  4.  Hypertension continue home meds  5. DVT prophylaxis subcutaneous Lovenox  All the records are reviewed and case discussed with Care Management/Social Workerr. Management plans discussed with the patient, family and they are in agreement.  CODE STATUS: full.  TOTAL TIME TAKING CARE OF THIS PATIENT: 35 minutes.    POSSIBLE D/C  IN 2 DAYS, DEPENDING ON CLINICAL CONDITION.   Ladean Steinmeyer M.D on 01/13/2017   Between 7am to 6pm - Pager - [phone]  After 6pm go to [email or web address] - password [email or web address] [facility]  Office  [phone]  CC: Primary care physician; Baltazar Apo, MD  Note: This dictation was prepared with Dragon dictation along with smaller phrase technology. Any transcriptional errors that result from this process are unintentional.

## 2017-01-13 NOTE — Op Note (Signed)
Laparoscopic Cholecystectomy  Pre-operative Diagnosis: Acute cholecystitis  Post-operative Diagnosis: Acute gangrenous cholecystitis  Procedure: Laparoscopic cholecystectomy  Surgeon: Jerrol Banana. Burt Knack, MD FACS  Anesthesia: Gen. with endotracheal tube  Assistant: Surgical tech  Procedure Details  The patient was seen again in the Holding Room. The benefits, complications, treatment options, and expected outcomes were discussed with the patient. The risks of bleeding, infection, recurrence of symptoms, failure to resolve symptoms, bile duct damage, bile duct leak, retained common bile duct stone, bowel injury, any of which could require further surgery and/or ERCP, stent, or papillotomy were reviewed with the patient. The likelihood of improving the patient's symptoms with return to their baseline status is good.  The patient and/or family concurred with the proposed plan, giving informed consent.  The patient was taken to Operating Room, identified as George Bolton and the procedure verified as Laparoscopic Cholecystectomy.  A Time Out was held and the above information confirmed.  Prior to the induction of general anesthesia, antibiotic prophylaxis was administered. VTE prophylaxis was in place. General endotracheal anesthesia was then administered and tolerated well. After the induction, the abdomen was prepped with Chloraprep and draped in the sterile fashion. The patient was positioned in the supine position.  Local anesthetic  was injected into the skin near the umbilicus and an incision made. The Veress needle was placed. Pneumoperitoneum was then created with CO2 and tolerated well without any adverse changes in the patient's vital signs. A 82mm port was placed in the periumbilical position and the abdominal cavity was explored.  Two 5-mm ports were placed in the right upper quadrant and a 12 mm epigastric port was placed all under direct vision. All skin incisions  were infiltrated with  a local anesthetic agent before making the incision and placing the trocars.   The patient was positioned  in reverse Trendelenburg, tilted slightly to the patient's left.  The gallbladder was identified, the fundus grasped and retracted cephalad. The gallbladder especially at the fundus was edematous and gangrenous green in color there is considerable edema and hyperemia. Adhesions were lysed bluntly. The infundibulum was grasped and retracted laterally, exposing the peritoneum overlying the triangle of Calot. This was then divided and exposed in a blunt fashion. A critical view of the cystic duct and cystic artery was obtained.  The cystic duct was clearly identified and bluntly dissected.   It was doubly clipped and divided. The cystic artery was doubly clipped and divided.  The gallbladder was taken from the gallbladder fossa in a retrograde fashion with the electrocautery. Because of intermittent is edema hyperemia and gangrene the gallbladder dissected off of the liver with great ease The gallbladder was removed and placed in an Endocatch bag. The liver bed was irrigated and inspected. Hemostasis was achieved with the electrocautery. A piece of Surgicel was placed on the hyperemic gallbladder bed Copious irrigation was utilized and was repeatedly aspirated until clear.  The gallbladder and Endocatch sac were then removed through the epigastric port site. The port site required enlargement due to the edematous gallbladder and large stones  A 10 mm JP drain was brought in through the epigastric port site and brought out through a lateral port site placed in the foramen of Winslow and held in with 3-0 nylon.  Inspection of the right upper quadrant was performed. No bleeding, bile duct injury or leak, or bowel injury was noted. Pneumoperitoneum was released.  The epigastric port site was closed with figure-of-eight 0 Vicryl sutures. 4-0 subcuticular Monocryl was used  to close the skin. Steristrips and  Mastisol and sterile dressings were  applied.  The patient was then extubated and brought to the recovery room in stable condition. Sponge, lap, and needle counts were correct at closure and at the conclusion of the case.   Findings: Acute gangrenous cholecystitis Cholecystitis   Estimated Blood Loss: 50 cc         Drains: JP 1         Specimens: Gallbladder           Complications: none               Elisa Kutner E. Burt Knack, MD, FACS

## 2017-01-13 NOTE — Anesthesia Post-op Follow-up Note (Cosign Needed)
Anesthesia QCDR form completed.        

## 2017-01-13 NOTE — Progress Notes (Signed)
Preoperative Review   Patient is met in the preoperative holding area. The history is reviewed in the chart and with the patient. I personally reviewed the options and rationale as well as the risks of this procedure that have been previously discussed with the patient. All questions asked by the patient and/or family were answered to their satisfaction. Since answered for patient and family members. History was reviewed in detail and discussed with Dr. Dahlia Byes. Patient agrees to proceed with this procedure at this time.  Florene Glen M.D. FACS

## 2017-01-13 NOTE — Anesthesia Procedure Notes (Signed)
Procedure Name: Intubation Date/Time: 01/13/2017 7:49 PM Performed by: Lendon Colonel Pre-anesthesia Checklist: Patient identified, Patient being monitored, Timeout performed, Emergency Drugs available and Suction available Patient Re-evaluated:Patient Re-evaluated prior to inductionOxygen Delivery Method: Circle system utilized Preoxygenation: Pre-oxygenation with 100% oxygen Intubation Type: IV induction Ventilation: Mask ventilation without difficulty Laryngoscope Size: Mac and 3 Grade View: Grade III Tube type: Oral Tube size: 7.0 mm Number of attempts: 2 Airway Equipment and Method: Stylet Placement Confirmation: ETT inserted through vocal cords under direct vision,  positive ETCO2 and breath sounds checked- equal and bilateral Secured at: 21 cm Tube secured with: Tape Dental Injury: Teeth and Oropharynx as per pre-operative assessment  Difficulty Due To: Difficult Airway- due to reduced neck mobility and Difficulty was anticipated

## 2017-01-13 NOTE — Progress Notes (Signed)
Initial Nutrition Assessment  DOCUMENTATION CODES:   Not applicable  INTERVENTION:   Await diet progression   NUTRITION DIAGNOSIS:   Inadequate oral intake related to acute illness as evidenced by NPO status.  GOAL:   Patient will meet greater than or equal to 90% of their needs  MONITOR:   PO intake, Labs, Weight trends, Diet advancement  REASON FOR ASSESSMENT:   Malnutrition Screening Tool    ASSESSMENT:    76 yo male admitted with acute cholecystitis, rapid afib with RVR. Pt with hx of CHF, HTN, afib, gout.  Pt reports last meal on Saturday; pt reports appetite very good and eats well. Pt reports 4 pound wt loss in 1 day after taking a laxative. Pt otherwise weight is stable.   Nutrition-Focused physical exam completed. Findings are WDL for fat depletion, muscle depletion, and edema.   Labs: reviewed Meds: NS at 75 ml/hr  Diet Order:  Diet NPO time specified  Skin:  Reviewed, no issues  Last BM:  01/11/17  Height:   Ht Readings from Last 1 Encounters:  01/11/17 6\' 1"  (1.854 m)    Weight:   Wt Readings from Last 1 Encounters:  01/11/17 198 lb (89.8 kg)    Filed Weights   01/11/17 1119  Weight: 198 lb (89.8 kg)    BMI:  Body mass index is 26.12 kg/m.  Estimated Nutritional Needs:   Kcal:  2000-2300 kcals  Protein:  100-115 g   Fluid:  >/= 2 L  EDUCATION NEEDS:   No education needs identified at this time  [place], [place], LDN [phone] Pager  [phone] Weekend/On-Call Pager

## 2017-01-13 NOTE — Progress Notes (Signed)
For lap chole today, cleared by cards The risks, benefits, complications, treatment options, and expected outcomes were discussed with the patient. The possibilities of bleeding, recurrent infection, finding a normal gallbladder, perforation of viscus organs, damage to surrounding structures, bile leak, abscess formation, needing a drain placed, the need for additional procedures, reaction to medication, pulmonary aspiration,  failure to diagnose a condition, the possible need to convert to an open procedure, and creating a complication requiring transfusion or operation were discussed with the patient. The patient and/or family concurred with the proposed plan, giving informed consent.

## 2017-01-13 NOTE — Anesthesia Postprocedure Evaluation (Signed)
Anesthesia Post Note  Patient: George Bolton  Procedure(s) Performed: Procedure(s) (LRB): LAPAROSCOPIC CHOLECYSTECTOMY (N/A)  Patient location during evaluation: PACU Anesthesia Type: General Level of consciousness: awake and alert Pain management: pain level controlled Vital Signs Assessment: post-procedure vital signs reviewed and stable Respiratory status: spontaneous breathing and respiratory function stable Cardiovascular status: stable Anesthetic complications: no     Last Vitals:  Vitals:   01/13/17 2043 01/13/17 2058  BP: 125/73 132/82  Pulse: 90 81  Resp: (!) 22 (!) 23  Temp: 36.3 C     Last Pain:  Vitals:   01/13/17 1443  TempSrc: Oral  PainSc:                  KEPHART,WILLIAM K

## 2017-01-13 NOTE — Progress Notes (Signed)
[facility] Cardiology [facility] Encounter Note  Patient: George Bolton / Admit Date: 01/11/2017 / Date of Encounter: 01/13/2017, 9:03 AM   Subjective: Patient is overall better. Does have some abdominal discomfort but slightly improved . No evidence of congestive heart failure or myocardial infarction. Heart rate of atrial fibrillation with better control on atenolol Echo with normal lv functin ef 55% Review of Systems: Positive for: Abdominal discomfort Negative for: Vision change, hearing change, syncope, dizziness, nausea, vomiting,diarrhea, bloody stool, cough, congestion, diaphoresis, urinary frequency, urinary pain,skin lesions, skin rashes Others previously listed  Objective: Telemetry: Atrial fibrillation with controlled ventricular rate Physical Exam: Blood pressure (!) 142/91, pulse (!) 110, temperature 98.4 F (36.9 C), temperature source Oral, resp. rate 18, height 6\' 1"  (1.854 m), weight 89.8 kg (198 lb), SpO2 98 %. Body mass index is 26.12 kg/m. General: Well developed, well nourished, in no acute distress. Head: Normocephalic, atraumatic, sclera non-icteric, no xanthomas, nares are without discharge. Neck: No apparent masses Lungs: Normal respirations with no wheezes, no rhonchi, no rales , no crackles   Heart: Irregular rate and rhythm, normal S1 S2, no murmur, no rub, no gallop, PMI is normal size and placement, carotid upstroke normal without bruit, jugular venous pressure normal Abdomen: Soft,  tender,  distended with normoactive bowel sounds. No hepatosplenomegaly. Abdominal aorta is normal size without bruit Extremities: No edema, no clubbing, no cyanosis, no ulcers,  Peripheral: 2+ radial, 2+ femoral, 2+ dorsal pedal pulses Neuro: Alert and oriented. Moves all extremities spontaneously. Psych:  Responds to questions appropriately with a normal affect.   Intake/Output Summary (Last 24 hours) at 01/13/17 0903 Last data filed at 01/13/17 0744  Gross per 24 hour   Intake             2621 ml  Output             1200 ml  Net             1421 ml    Inpatient Medications:  . ampicillin-sulbactam (UNASYN) IV  3 g Intravenous Q6H  . atenolol  50 mg Oral Daily  . atorvastatin  40 mg Oral Daily  . Chlorhexidine Gluconate Cloth  6 each Topical Once  . enoxaparin (LOVENOX) injection  40 mg Subcutaneous Q24H  . felodipine  10 mg Oral Daily  . hydrALAZINE  50 mg Oral TID   Infusions:  . sodium chloride 75 mL/hr at 01/13/17 0146    Labs:  Recent Labs  01/11/17 1127 01/12/17 0904  NA 137 139  K 3.8 3.6  CL 106 108  CO2 22 23  GLUCOSE 125* 77  BUN 18 18  CREATININE 1.52* 1.49*  CALCIUM 9.4 8.9    Recent Labs  01/11/17 1127 01/12/17 0904  AST 24 20  ALT 12* 11*  ALKPHOS 75 67  BILITOT 1.7*  1.7* 1.4*  PROT 7.8 6.5  ALBUMIN 3.9 3.1*    Recent Labs  01/11/17 1127 01/12/17 0904  WBC 12.9* 9.2  HGB 15.7 13.4  HCT 46.6 38.8*  MCV 84.3 85.0  PLT 192 171    Recent Labs  01/11/17 1127  TROPONINI 0.03*   Invalid input(s): POCBNP No results for input(s): HGBA1C in the last 72 hours.   Weights: Filed Weights   01/11/17 1119  Weight: 89.8 kg (198 lb)     Radiology/Studies:  Ct Abdomen Pelvis W Contrast  Result Date: 01/11/2017 CLINICAL DATA:  Right lower quadrant pain for 4 days EXAM: CT ABDOMEN AND PELVIS WITH CONTRAST  TECHNIQUE: Multidetector CT imaging of the abdomen and pelvis was performed using the standard protocol following bolus administration of intravenous contrast. CONTRAST:  181mL ISOVUE-300 IOPAMIDOL (ISOVUE-300) INJECTION 61% COMPARISON:  09/23/2012 FINDINGS: Lower chest: No acute abnormality. Hepatobiliary: Normal liver without a focal hepatic mass. Cholelithiasis. Severe gallbladder wall thickening. No intrahepatic or extrahepatic biliary ductal dilatation. Pancreas: Unremarkable. No pancreatic ductal dilatation or surrounding inflammatory changes. Spleen: Normal in size without focal abnormality.  Adrenals/Urinary Tract: Normal adrenal glands. Multiple hypodense, fluid attenuating bilateral renal masses most consistent with cysts. 10 mm complex cystic right upper pole renal mass with small peripheral calcification, but overall incompletely characterized. Multiple nonobstructing left nephrolithiasis. No hydronephrosis. Right renal cortical scarring anteriorly. Stomach/Bowel: Stomach is within normal limits. Appendix appears normal. No evidence of bowel wall thickening, distention, or inflammatory changes. Vascular/Lymphatic: Normal caliber abdominal aorta with atherosclerosis. No lymphadenopathy. Reproductive: Enlarged prostate gland. Other: No abdominal wall hernia or abnormality. No abdominopelvic ascites. Musculoskeletal: No acute osseous abnormality. No lytic or sclerotic osseous lesion. 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 IMPRESSION: 1. Cholelithiasis with gallbladder wall thickening concerning for acute cholecystitis. Correlate with laboratory values. 2. Normal appendix. 3. Incompletely characterized 10 mm cystic right upper pole renal mass likely reflecting a complicated cyst. 4. Enlarged prostate gland. 5. Nonobstructing left nephrolithiasis. Electronically Signed   By: Kathreen Devoid   On: 01/11/2017 13:42   US Venous Img Lower Bilateral  Result Date: 01/12/2017 CLINICAL DATA:  Left lower extremity edema EXAM: BILATERAL LOWER EXTREMITY VENOUS DOPPLER ULTRASOUND TECHNIQUE: Gray-scale sonography with graded compression, as well as color Doppler and duplex ultrasound were performed to evaluate the lower extremity deep venous systems from the level of the common femoral vein and including the common femoral, femoral, profunda femoral, popliteal and calf veins including the posterior tibial, peroneal and gastrocnemius veins when visible. The superficial great saphenous vein was also interrogated. Spectral Doppler was utilized to evaluate flow at rest and  with distal augmentation maneuvers in the common femoral, femoral and popliteal veins. COMPARISON:  None. FINDINGS: RIGHT LOWER EXTREMITY Common Femoral Vein: No evidence of thrombus. Normal compressibility, respiratory phasicity and response to augmentation. Saphenofemoral Junction: No evidence of thrombus. Normal compressibility and flow on color Doppler imaging. Profunda Femoral Vein: No evidence of thrombus. Normal compressibility and flow on color Doppler imaging. Femoral Vein: No evidence of thrombus. Normal compressibility, respiratory phasicity and response to augmentation. Popliteal Vein: No evidence of thrombus. Normal compressibility, respiratory phasicity and response to augmentation. Calf Veins: No evidence of thrombus. Normal compressibility and flow on color Doppler imaging. Superficial Great Saphenous Vein: No evidence of thrombus. Normal compressibility and flow on color Doppler imaging. Venous Reflux:  None. Other Findings:  None. LEFT LOWER EXTREMITY Common Femoral Vein: No evidence of thrombus. Normal compressibility, respiratory phasicity and response to augmentation. Saphenofemoral Junction: No evidence of thrombus. Normal compressibility and flow on color Doppler imaging. Profunda Femoral Vein: No evidence of thrombus. Normal compressibility and flow on color Doppler imaging. Femoral Vein: No evidence of thrombus. Normal compressibility, respiratory phasicity and response to augmentation. Popliteal Vein: No evidence of thrombus. Normal compressibility, respiratory phasicity and response to augmentation. Calf Veins: No evidence of thrombus. Normal compressibility and flow on color Doppler imaging. Superficial Great Saphenous Vein: No evidence of thrombus. Normal compressibility and flow on color Doppler imaging. Venous Reflux:  None. Other Findings:  None. IMPRESSION: No evidence of deep venous thrombosis. Electronically Signed   By: Inez Catalina M.D.   On: 01/12/2017 16:29  US Abdomen  Limited Ruq  Result Date: 01/11/2017 CLINICAL DATA:  Three-day history abdominal pain EXAM: US ABDOMEN LIMITED - RIGHT UPPER QUADRANT COMPARISON:  CT abdomen and pelvis January 11, 2017 FINDINGS: Gallbladder: Within the gallbladder, there are multiple echogenic foci which move and shadow consistent with gallstones. Largest gallstone measures 1.4 cm in length. Gallbladder wall is thickened and edematous with pericholecystic fluid. No sonographic Murphy sign noted by sonographer. Common bile duct: Diameter: 4 mm. No intrahepatic or extrahepatic biliary duct dilatation. Liver: No focal lesion identified. Within normal limits in parenchymal echogenicity. IMPRESSION: Findings as described above consistent with acute cholecystitis. Electronically Signed   By: Lowella Grip III M.D.   On: 01/11/2017 14:43     Assessment and Recommendation  76 y.o. male with essential hypertension mixed hyperlipidemia cardiovascular disease with chronic Nonvalvular atrial fibrillation with rapid ventricular rate likely secondary to current illness and abdominal pain and lack of use of beta blocker without evidence of myocardial infarction or congestive heart failure 1. Continue atenolol for heart rate control of atrial fibrillation with a goal heart rate between 60 and 90 bpm 2. No anticoagulation at this time for further risk reduction of stroke due to patient's wishes 3. High intensity cholesterol therapy with atorvastatin 4. Furosemide for lower extremity edema and diastolic dysfunction heart failure stable 5. Hypertension control with felodipine 6. Begin ambulation following for improvements of symptoms and proceed with surgery for gallbladder with low risk at this time due to stablility of above  Signed, Serafina Royals M.D. FACC

## 2017-01-13 NOTE — Anesthesia Preprocedure Evaluation (Signed)
Anesthesia Evaluation  Patient identified by MRN, date of birth, ID band Patient awake    Reviewed: Allergy & Precautions, NPO status , Patient's Chart, lab work & pertinent test results  History of Anesthesia Complications Negative for: history of anesthetic complications  Airway Mallampati: II       Dental  (+) Edentulous Upper, Missing, Chipped   Pulmonary neg pulmonary ROS,           Cardiovascular hypertension, Pt. on medications and Pt. on home beta blockers +CHF (hx)       Neuro/Psych negative neurological ROS     GI/Hepatic negative GI ROS, Neg liver ROS,   Endo/Other  negative endocrine ROS  Renal/GU negative Renal ROS     Musculoskeletal   Abdominal   Peds  Hematology negative hematology ROS (+)   Anesthesia Other Findings   Reproductive/Obstetrics                             Anesthesia Physical Anesthesia Plan  ASA: III  Anesthesia Plan: General   Post-op Pain Management:    Induction: Intravenous  Airway Management Planned: Oral ETT  Additional Equipment:   Intra-op Plan:   Post-operative Plan:   Informed Consent: I have reviewed the patients History and Physical, chart, labs and discussed the procedure including the risks, benefits and alternatives for the proposed anesthesia with the patient or authorized representative who has indicated his/her understanding and acceptance.     Plan Discussed with:   Anesthesia Plan Comments:         Anesthesia Quick Evaluation

## 2017-01-13 NOTE — Transfer of Care (Signed)
Immediate Anesthesia Transfer of Care Note  Patient: George Bolton  Procedure(s) Performed: Procedure(s): LAPAROSCOPIC CHOLECYSTECTOMY (N/A)  Patient Location: PACU  Anesthesia Type:General  Level of Consciousness: awake and patient cooperative  Airway & Oxygen Therapy: Patient Spontanous Breathing and Patient connected to face mask oxygen  Post-op Assessment: Report given to RN and Post -op Vital signs reviewed and stable  Post vital signs: Reviewed and stable  Last Vitals:  Vitals:   01/13/17 1132 01/13/17 1443  BP: 130/82 125/78  Pulse: 92 79  Resp: 18 16  Temp: 36.4 C 36.6 C    Last Pain:  Vitals:   01/13/17 1443  TempSrc: Oral  PainSc:          Complications: No apparent anesthesia complications

## 2017-01-14 ENCOUNTER — Encounter: Payer: Self-pay | Admitting: Surgery

## 2017-01-14 LAB — CBC WITH DIFFERENTIAL/PLATELET
Basophils Absolute: 0.1 10*3/uL (ref 0–0.1)
Basophils Relative: 1 %
Eosinophils Absolute: 0 10*3/uL (ref 0–0.7)
Eosinophils Relative: 1 %
HCT: 40.2 % (ref 40.0–52.0)
Hemoglobin: 13.7 g/dL (ref 13.0–18.0)
Lymphocytes Relative: 9 %
Lymphs Abs: 0.7 10*3/uL — ABNORMAL LOW (ref 1.0–3.6)
MCH: 28.9 pg (ref 26.0–34.0)
MCHC: 34.1 g/dL (ref 32.0–36.0)
MCV: 84.6 fL (ref 80.0–100.0)
Monocytes Absolute: 0.7 10*3/uL (ref 0.2–1.0)
Monocytes Relative: 9 %
Neutro Abs: 6.6 10*3/uL — ABNORMAL HIGH (ref 1.4–6.5)
Neutrophils Relative %: 80 %
Platelets: 198 10*3/uL (ref 150–440)
RBC: 4.75 MIL/uL (ref 4.40–5.90)
RDW: 14.9 % — ABNORMAL HIGH (ref 11.5–14.5)
WBC: 8.2 10*3/uL (ref 3.8–10.6)

## 2017-01-14 LAB — COMPREHENSIVE METABOLIC PANEL
ALT: 17 U/L (ref 17–63)
AST: 42 U/L — ABNORMAL HIGH (ref 15–41)
Albumin: 2.9 g/dL — ABNORMAL LOW (ref 3.5–5.0)
Alkaline Phosphatase: 68 U/L (ref 38–126)
Anion gap: 10 (ref 5–15)
BUN: 16 mg/dL (ref 6–20)
CO2: 19 mmol/L — ABNORMAL LOW (ref 22–32)
Calcium: 8.5 mg/dL — ABNORMAL LOW (ref 8.9–10.3)
Chloride: 110 mmol/L (ref 101–111)
Creatinine, Ser: 1.41 mg/dL — ABNORMAL HIGH (ref 0.61–1.24)
GFR calc Af Amer: 54 mL/min — ABNORMAL LOW (ref >60)
GFR calc non Af Amer: 47 mL/min — ABNORMAL LOW (ref >60)
Glucose, Bld: 88 mg/dL (ref 65–99)
Potassium: 3.7 mmol/L (ref 3.5–5.1)
Sodium: 139 mmol/L (ref 135–145)
Total Bilirubin: 1.3 mg/dL — ABNORMAL HIGH (ref 0.3–1.2)
Total Protein: 6.2 g/dL — ABNORMAL LOW (ref 6.5–8.1)

## 2017-01-14 NOTE — Progress Notes (Signed)
[place] [facility] [place] NAME: George Bolton    MR#:  [id number]  DATE OF BIRTH:  06-22-1941  SUBJECTIVE:  Patient is status post cholecystectomy. He has a JP drain. Doing well. Tolerating clear liquids.  REVIEW OF SYSTEMS:   Review of Systems  Constitutional: Negative for chills, fever and weight loss.  HENT: Negative for ear discharge, ear pain and nosebleeds.   Eyes: Negative for blurred vision, pain and discharge.  Respiratory: Negative for sputum production, shortness of breath, wheezing and stridor.   Cardiovascular: Negative for chest pain, palpitations, orthopnea and PND.  Gastrointestinal: Negative for abdominal pain, diarrhea, nausea and vomiting.  Genitourinary: Negative for frequency and urgency.  Musculoskeletal: Negative for back pain and joint pain.  Neurological: Negative for sensory change, speech change, focal weakness and weakness.  Psychiatric/Behavioral: Negative for depression and hallucinations. The patient is not nervous/anxious.     DRUG ALLERGIES:   Allergies  Allergen Reactions  . Cortisone     VITALS:  Blood pressure 117/69, pulse 78, temperature 98.5 F (36.9 C), temperature source Oral, resp. rate 20, height 6\' 1"  (1.854 m), weight 89.8 kg (198 lb), SpO2 95 %.  PHYSICAL EXAMINATION:   Physical Exam  Constitutional: He is oriented to person, place, and time and well-developed, well-nourished, and in no distress.  HENT:  Head: Normocephalic.  Mouth/Throat: Oropharynx is clear and moist.  Eyes: Conjunctivae and EOM are normal. Pupils are equal, round, and reactive to light.  Neck: Normal range of motion. Neck supple.  Cardiovascular: Normal rate, regular rhythm, normal heart sounds and intact distal pulses.   Pulmonary/Chest: Effort normal and breath sounds normal.  Abdominal: Soft. Bowel sounds are normal. There is no tenderness. There is no rebound and no guarding.  Musculoskeletal: Normal range of  motion.  Neurological: He is alert and oriented to person, place, and time. He displays normal reflexes. No cranial nerve deficit.  Skin: Skin is warm and dry.  Psychiatric: Mood, memory, affect and judgment normal.   LABORATORY PANEL:   CBC  Recent Labs Lab 01/14/17 0620  WBC 8.2  HGB 13.7  HCT 40.2  PLT 198   ------------------------------------------------------------------------------------------------------------------  Chemistries   Recent Labs Lab 01/14/17 0620  NA 139  K 3.7  CL 110  CO2 19*  GLUCOSE 88  BUN 16  CREATININE 1.41*  CALCIUM 8.5*  AST 42*  ALT 17  ALKPHOS 68  BILITOT 1.3*   ------------------------------------------------------------------------------------------------------------------  Cardiac Enzymes  Recent Labs Lab 01/11/17 1127  TROPONINI 0.03*   ------------------------------------------------------------------------------------------------------------------  RADIOLOGY:  US Venous Img Lower Bilateral  Result Date: 01/12/2017 CLINICAL DATA:  Left lower extremity edema EXAM: BILATERAL LOWER EXTREMITY VENOUS DOPPLER ULTRASOUND TECHNIQUE: Gray-scale sonography with graded compression, as well as color Doppler and duplex ultrasound were performed to evaluate the lower extremity deep venous systems from the level of the common femoral vein and including the common femoral, femoral, profunda femoral, popliteal and calf veins including the posterior tibial, peroneal and gastrocnemius veins when visible. The superficial great saphenous vein was also interrogated. Spectral Doppler was utilized to evaluate flow at rest and with distal augmentation maneuvers in the common femoral, femoral and popliteal veins. COMPARISON:  None. FINDINGS: RIGHT LOWER EXTREMITY Common Femoral Vein: No evidence of thrombus. Normal compressibility, respiratory phasicity and response to augmentation. Saphenofemoral Junction: No evidence of thrombus. Normal compressibility  and flow on color Doppler imaging. Profunda Femoral Vein: No evidence of thrombus. Normal compressibility and flow on color Doppler imaging. Femoral  Vein: No evidence of thrombus. Normal compressibility, respiratory phasicity and response to augmentation. Popliteal Vein: No evidence of thrombus. Normal compressibility, respiratory phasicity and response to augmentation. Calf Veins: No evidence of thrombus. Normal compressibility and flow on color Doppler imaging. Superficial Great Saphenous Vein: No evidence of thrombus. Normal compressibility and flow on color Doppler imaging. Venous Reflux:  None. Other Findings:  None. LEFT LOWER EXTREMITY Common Femoral Vein: No evidence of thrombus. Normal compressibility, respiratory phasicity and response to augmentation. Saphenofemoral Junction: No evidence of thrombus. Normal compressibility and flow on color Doppler imaging. Profunda Femoral Vein: No evidence of thrombus. Normal compressibility and flow on color Doppler imaging. Femoral Vein: No evidence of thrombus. Normal compressibility, respiratory phasicity and response to augmentation. Popliteal Vein: No evidence of thrombus. Normal compressibility, respiratory phasicity and response to augmentation. Calf Veins: No evidence of thrombus. Normal compressibility and flow on color Doppler imaging. Superficial Great Saphenous Vein: No evidence of thrombus. Normal compressibility and flow on color Doppler imaging. Venous Reflux:  None. Other Findings:  None. IMPRESSION: No evidence of deep venous thrombosis. Electronically Signed   By: Inez Catalina M.D.   On: 01/12/2017 16:29    ASSESSMENT AND PLAN:   Active Problems:   Acute cholecystitis  1. Abdominal pain/epigastric pain secondary to acute gangrenous cholecystitis/cholelithiasis. -Surgical consultation appreciated Patient is s/p POD #1 laparoscopic cholecystectomy -IV pain medicine as needed -Empiric IV Unasyn---change to augmentin at d/c  2. Rapid A. fib  with RVR - continue atenolol. Heart rate is much improved.  -Not on any anticoagulation. Does not follow cardiologist. -resuem asa at d/c  3. Leukocytosis continue antibiotics  4. Hypertension continue home meds  5. DVT prophylaxis subcutaneous Lovenox  PT to see pt  All the records are reviewed and case discussed with Care Management/Social Workerr. Management plans discussed with the patient, family and they are in agreement.  CODE STATUS: full.  TOTAL TIME TAKING CARE OF THIS PATIENT: 25 minutes.    POSSIBLE D/C IN 1 DAYS, DEPENDING ON CLINICAL CONDITION.   Xzander Gilham M.D on 01/14/2017   Between 7am to 6pm - Pager - [phone]  After 6pm go to [email or web address] - password [email or web address] [facility]  Office  [phone]  CC: Primary care physician; Baltazar Apo, MD  Note: This dictation was prepared with Dragon dictation along with smaller phrase technology. Any transcriptional errors that result from this process are unintentional.

## 2017-01-14 NOTE — Progress Notes (Signed)
POD # 1 Gangrenous cholecystitis Doing well AVSS  PE NAD Abd: soft, JP serosanguinous drainage No peritonitis or infection  A/P Doing well Advance diet DC home in am per hospitalist No complications

## 2017-01-14 NOTE — Progress Notes (Signed)
[facility] Cardiology [facility] Encounter Note  Patient: George Bolton / Admit Date: 01/11/2017 / Date of Encounter: 01/14/2017, 8:15 AM   Subjective: Patient is overall better. Does have some abdominal discomfort but slightly improved . No evidence of congestive heart failure or myocardial infarction. Heart rate of atrial fibrillation with better control on atenolol Echo with normal lv functin ef 55% No apparent complications of surgical intervention Review of Systems: Positive for: Abdominal discomfort Negative for: Vision change, hearing change, syncope, dizziness, nausea, vomiting,diarrhea, bloody stool, cough, congestion, diaphoresis, urinary frequency, urinary pain,skin lesions, skin rashes Others previously listed  Objective: Telemetry: Atrial fibrillation with controlled ventricular rate Physical Exam: Blood pressure 130/78, pulse 90, temperature 98.3 F (36.8 C), temperature source Oral, resp. rate 20, height 6\' 1"  (1.854 m), weight 89.8 kg (198 lb), SpO2 97 %. Body mass index is 26.12 kg/m. General: Well developed, well nourished, in no acute distress. Head: Normocephalic, atraumatic, sclera non-icteric, no xanthomas, nares are without discharge. Neck: No apparent masses Lungs: Normal respirations with no wheezes, no rhonchi, no rales , no crackles   Heart: Irregular rate and rhythm, normal S1 S2, no murmur, no rub, no gallop, PMI is normal size and placement, carotid upstroke normal without bruit, jugular venous pressure normal Abdomen: Soft,  tender,  distended with normoactive bowel sounds. No hepatosplenomegaly. Abdominal aorta is normal size without bruit Extremities: No edema, no clubbing, no cyanosis, no ulcers,  Peripheral: 2+ radial, 2+ femoral, 2+ dorsal pedal pulses Neuro: Alert and oriented. Moves all extremities spontaneously. Psych:  Responds to questions appropriately with a normal affect.   Intake/Output Summary (Last 24 hours) at 01/14/17 0815 Last data  filed at 01/14/17 6759  Gross per 24 hour  Intake             1534 ml  Output             1055 ml  Net              479 ml    Inpatient Medications:  . ampicillin-sulbactam (UNASYN) IV  3 g Intravenous Q6H  . aspirin  325 mg Oral Daily  . atenolol  50 mg Oral Daily  . atorvastatin  40 mg Oral Daily  . Chlorhexidine Gluconate Cloth  6 each Topical Once  . enoxaparin (LOVENOX) injection  40 mg Subcutaneous Q24H  . felodipine  10 mg Oral Daily  . hydrALAZINE  50 mg Oral TID   Infusions:  . sodium chloride 75 mL/hr at 01/13/17 1439    Labs:  Recent Labs  01/12/17 0904 01/14/17 0620  NA 139 139  K 3.6 3.7  CL 108 110  CO2 23 19*  GLUCOSE 77 88  BUN 18 16  CREATININE 1.49* 1.41*  CALCIUM 8.9 8.5*    Recent Labs  01/12/17 0904 01/14/17 0620  AST 20 42*  ALT 11* 17  ALKPHOS 67 68  BILITOT 1.4* 1.3*  PROT 6.5 6.2*  ALBUMIN 3.1* 2.9*    Recent Labs  01/12/17 0904 01/14/17 0620  WBC 9.2 8.2  NEUTROABS  --  6.6*  HGB 13.4 13.7  HCT 38.8* 40.2  MCV 85.0 84.6  PLT 171 198    Recent Labs  01/11/17 1127  TROPONINI 0.03*   Invalid input(s): POCBNP No results for input(s): HGBA1C in the last 72 hours.   Weights: Filed Weights   01/11/17 1119  Weight: 89.8 kg (198 lb)     Radiology/Studies:  Ct Abdomen Pelvis W Contrast  Result Date: 01/11/2017  CLINICAL DATA:  Right lower quadrant pain for 4 days EXAM: CT ABDOMEN AND PELVIS WITH CONTRAST TECHNIQUE: Multidetector CT imaging of the abdomen and pelvis was performed using the standard protocol following bolus administration of intravenous contrast. CONTRAST:  138mL ISOVUE-300 IOPAMIDOL (ISOVUE-300) INJECTION 61% COMPARISON:  09/23/2012 FINDINGS: Lower chest: No acute abnormality. Hepatobiliary: Normal liver without a focal hepatic mass. Cholelithiasis. Severe gallbladder wall thickening. No intrahepatic or extrahepatic biliary ductal dilatation. Pancreas: Unremarkable. No pancreatic ductal dilatation or  surrounding inflammatory changes. Spleen: Normal in size without focal abnormality. Adrenals/Urinary Tract: Normal adrenal glands. Multiple hypodense, fluid attenuating bilateral renal masses most consistent with cysts. 10 mm complex cystic right upper pole renal mass with small peripheral calcification, but overall incompletely characterized. Multiple nonobstructing left nephrolithiasis. No hydronephrosis. Right renal cortical scarring anteriorly. Stomach/Bowel: Stomach is within normal limits. Appendix appears normal. No evidence of bowel wall thickening, distention, or inflammatory changes. Vascular/Lymphatic: Normal caliber abdominal aorta with atherosclerosis. No lymphadenopathy. Reproductive: Enlarged prostate gland. Other: No abdominal wall hernia or abnormality. No abdominopelvic ascites. Musculoskeletal: No acute osseous abnormality. No lytic or sclerotic osseous lesion. 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 IMPRESSION: 1. Cholelithiasis with gallbladder wall thickening concerning for acute cholecystitis. Correlate with laboratory values. 2. Normal appendix. 3. Incompletely characterized 10 mm cystic right upper pole renal mass likely reflecting a complicated cyst. 4. Enlarged prostate gland. 5. Nonobstructing left nephrolithiasis. Electronically Signed   By: Kathreen Devoid   On: 01/11/2017 13:42   US Venous Img Lower Bilateral  Result Date: 01/12/2017 CLINICAL DATA:  Left lower extremity edema EXAM: BILATERAL LOWER EXTREMITY VENOUS DOPPLER ULTRASOUND TECHNIQUE: Gray-scale sonography with graded compression, as well as color Doppler and duplex ultrasound were performed to evaluate the lower extremity deep venous systems from the level of the common femoral vein and including the common femoral, femoral, profunda femoral, popliteal and calf veins including the posterior tibial, peroneal and gastrocnemius veins when visible. The superficial great saphenous vein  was also interrogated. Spectral Doppler was utilized to evaluate flow at rest and with distal augmentation maneuvers in the common femoral, femoral and popliteal veins. COMPARISON:  None. FINDINGS: RIGHT LOWER EXTREMITY Common Femoral Vein: No evidence of thrombus. Normal compressibility, respiratory phasicity and response to augmentation. Saphenofemoral Junction: No evidence of thrombus. Normal compressibility and flow on color Doppler imaging. Profunda Femoral Vein: No evidence of thrombus. Normal compressibility and flow on color Doppler imaging. Femoral Vein: No evidence of thrombus. Normal compressibility, respiratory phasicity and response to augmentation. Popliteal Vein: No evidence of thrombus. Normal compressibility, respiratory phasicity and response to augmentation. Calf Veins: No evidence of thrombus. Normal compressibility and flow on color Doppler imaging. Superficial Great Saphenous Vein: No evidence of thrombus. Normal compressibility and flow on color Doppler imaging. Venous Reflux:  None. Other Findings:  None. LEFT LOWER EXTREMITY Common Femoral Vein: No evidence of thrombus. Normal compressibility, respiratory phasicity and response to augmentation. Saphenofemoral Junction: No evidence of thrombus. Normal compressibility and flow on color Doppler imaging. Profunda Femoral Vein: No evidence of thrombus. Normal compressibility and flow on color Doppler imaging. Femoral Vein: No evidence of thrombus. Normal compressibility, respiratory phasicity and response to augmentation. Popliteal Vein: No evidence of thrombus. Normal compressibility, respiratory phasicity and response to augmentation. Calf Veins: No evidence of thrombus. Normal compressibility and flow on color Doppler imaging. Superficial Great Saphenous Vein: No evidence of thrombus. Normal compressibility and flow on color Doppler imaging. Venous Reflux:  None. Other Findings:  None. IMPRESSION: No evidence of deep  venous thrombosis.  Electronically Signed   By: Inez Catalina M.D.   On: 01/12/2017 16:29   US Abdomen Limited Ruq  Result Date: 01/11/2017 CLINICAL DATA:  Three-day history abdominal pain EXAM: US ABDOMEN LIMITED - RIGHT UPPER QUADRANT COMPARISON:  CT abdomen and pelvis January 11, 2017 FINDINGS: Gallbladder: Within the gallbladder, there are multiple echogenic foci which move and shadow consistent with gallstones. Largest gallstone measures 1.4 cm in length. Gallbladder wall is thickened and edematous with pericholecystic fluid. No sonographic Murphy sign noted by sonographer. Common bile duct: Diameter: 4 mm. No intrahepatic or extrahepatic biliary duct dilatation. Liver: No focal lesion identified. Within normal limits in parenchymal echogenicity. IMPRESSION: Findings as described above consistent with acute cholecystitis. Electronically Signed   By: Lowella Grip III M.D.   On: 01/11/2017 14:43     Assessment and Recommendation  76 y.o. male with essential hypertension mixed hyperlipidemia cardiovascular disease with chronic Nonvalvular atrial fibrillation with rapid ventricular rate likely secondary to current illness and abdominal pain and lack of use of beta blocker without evidence of myocardial infarction or congestive heart failure 1. Continue atenolol for heart rate control of atrial fibrillation with a goal heart rate between 60 and 90 bpm 2. No anticoagulation at this time for further risk reduction of stroke due to patient's wishes 3. High intensity cholesterol therapy with atorvastatin 4. Furosemide for lower extremity edema and diastolic dysfunction heart failure stable 5. Hypertension control with felodipine 6. Begin ambulation following for improvements of symptoms without restrictions to recovery from surgery. 7. No further cardiac interventions at this time 7. Follow-up to with adjustments of medications in one to 2 weeks after discharge  Signed, Serafina Royals M.D. FACC

## 2017-01-14 NOTE — Evaluation (Signed)
Physical Therapy Evaluation Patient Details Name: George Bolton MRN: [id number] DOB: Jul 15, 1941 Today's Date: 01/14/2017   History of Present Illness  Pt is a 76 yo male admitted to [facility] w/ abdominal pain and nausea. Diagnosed w/ choleystitis and is s/p Laparoscopic Cholecystectomy (01/13/17). PMH inlcudes; A-fib, CHF, Gout, HTN, and Irregular heart beat.     Clinical Impression  Pt stated he felt weaker then normal and eager to get OOB and ambulate. Noticed some minimal draining at surgical site wound dressing, nursing was able to change dressing and problem resolved. Nursing also consulted and agreed to trial patient on room air. O2 levels stayed above 93% at rest and above 91% during mobility. Pt displays some decreased overall strength throughout and requires increased time for bed mobility and min assist to transfer to standing. In standing patient appeared unsteady and required a RW for improved balance. He displayed significant forward flexed posture throughout standing with limited ability to self correct. Pt able to ambulate 100' w/ RW and min guarding and stated he felt weak while ambulating but no LOB or SOB noted, gait speed is slow indicative of higher fall risk. Pt is normally independent at baseline and does not use any AD, he did experience a fall 2-3 weeks ago. Overall patient displays decreased strength and balance and is a high fall risk, recommend use of RW in standing for improved balance, he will benefit from skilled PT to further improve functional balance and general strength. Recommend HHPT following acute hospital stay.     Follow Up Recommendations Home health PT;Supervision for mobility/OOB    Equipment Recommendations  Rolling walker with 5" wheels;3in1 (PT)    Recommendations for Other Services       Precautions / Restrictions Precautions Precautions: Fall Restrictions Weight Bearing Restrictions: No      Mobility  Bed Mobility Overal bed mobility:  Modified Independent             General bed mobility comments: requires increased time to move to sitting at EOB   Transfers Overall transfer level: Needs assistance Equipment used: Rolling walker (2 wheeled) Transfers: Sit to/from Stand Sit to Stand: Min assist         General transfer comment: requires min assist to transfer to standing w/ HHA and use of RW  Ambulation/Gait Ambulation/Gait assistance: Event organiser (Feet): 100 Feet Assistive device: Rolling walker (2 wheeled) Gait Pattern/deviations: Step-to pattern;Decreased stride length;Trunk flexed   Gait velocity interpretation: Below normal speed for age/gender General Gait Details: pt ambulates slowly w/ step to gait pattern, head and upper back remain forwardly flexed throughout gait, cuing provided on safe use of RW and to imprve gait mechanics  Stairs            Wheelchair Mobility    Modified Rankin (Stroke Patients Only)       Balance Overall balance assessment: Needs assistance;History of Falls Sitting-balance support: Single extremity supported Sitting balance-Leahy Scale: Good Sitting balance - Comments: able to maintain seated posture w/ single UE support no back support, remains forwardly flexed and requires cuing for looking up   Standing balance support: Bilateral upper extremity supported Standing balance-Leahy Scale: Fair Standing balance comment: currently requires use of RW in standing for improved stability                             Pertinent Vitals/Pain Pain Assessment: Faces Faces Pain Scale: Hurts a little bit Pain Location: L  knee Pain Intervention(s): Monitored during session;Limited activity within patient's tolerance    Home Living Family/patient expects to be discharged to:: Private residence Living Arrangements: Spouse/significant other Available Help at Discharge: Family;Available 24 hours/day (wife lives w/ pt at home) Type of Home:  House Home Access: Stairs to enter Entrance Stairs-Rails: Left Entrance Stairs-Number of Steps: 3 Home Layout: One level Home Equipment: None      Prior Function Level of Independence: Independent         Comments: pt independent in all ADLs and IADLs      Hand Dominance        Extremity/Trunk Assessment   Upper Extremity Assessment Upper Extremity Assessment: Generalized weakness (grossly 4/5)    Lower Extremity Assessment Lower Extremity Assessment: Generalized weakness (grossly 3+/5 throughout)    Cervical / Trunk Assessment Cervical / Trunk Assessment: Kyphotic  Communication   Communication: No difficulties  Cognition Arousal/Alertness: Awake/alert Behavior During Therapy: WFL for tasks assessed/performed Overall Cognitive Status: Within Functional Limits for tasks assessed                      General Comments      Exercises General Exercises - Lower Extremity Ankle Circles/Pumps: AROM;Strengthening;Both;10 reps;Supine Heel Slides: AROM;Strengthening;Both;10 reps Straight Leg Raises: AROM;Strengthening;Both;10 reps;Supine   Assessment/Plan    PT Assessment Patient needs continued PT services  PT Problem List Decreased strength;Decreased activity tolerance;Decreased balance;Decreased mobility;Decreased knowledge of use of DME       PT Treatment Interventions DME instruction;Gait training;Stair training;Functional mobility training;Balance training;Therapeutic exercise;Therapeutic activities;Patient/family education    PT Goals (Current goals can be found in the Care Plan section)  Acute Rehab PT Goals Patient Stated Goal: Get his strength back and go home PT Goal Formulation: With patient/family Time For Goal Achievement: 01/28/17 Potential to Achieve Goals: Good    Frequency Min 2X/week   Barriers to discharge        Co-evaluation               End of Session Equipment Utilized During Treatment: Gait belt Activity  Tolerance: Patient tolerated treatment well Patient left: in bed;with bed alarm set;with family/visitor present;with call bell/phone within reach Nurse Communication: Mobility status;Other (comment) (O2 sat levels on room air) PT Visit Diagnosis: Muscle weakness (generalized) (M62.81);Unsteadiness on feet (R26.81);History of falling (Z91.81)         Time: 5110-2111 PT Time Calculation (min) (ACUTE ONLY): 33 min   Charges:         PT G Codes:         George Bolton Student PT 01/14/2017, 3:57 PM

## 2017-01-15 LAB — SURGICAL PATHOLOGY

## 2017-01-15 MED ORDER — KETOROLAC TROMETHAMINE 15 MG/ML IJ SOLN
15.0000 mg | Freq: Once | INTRAMUSCULAR | Status: AC
  Administered 2017-01-15: 15 mg via INTRAVENOUS
  Filled 2017-01-15: qty 1

## 2017-01-15 NOTE — Progress Notes (Signed)
POD # 2 Doing well AVSS Taking PO  PE NAD Abd: soft, serosanguinous drainage. Incisions c/d/I  A/P Doing well DC home today No heavy lifting x 6 wks ( 20 lbs) Keep JP F/U next week w Dr. Burt Knack

## 2017-01-15 NOTE — Care Management Note (Signed)
Case Management Note  Patient Details  Name: MANSEL STROTHER MRN: [id number] Date of Birth: 12/08/1940  Subjective/Objective:  Spoke with patient regarding discharge planning. Offered choice of home health agencies. Referral to [facility] for HHPT. Will need a rolling walker.  Ordered from [facility]. Patient lives at home with his wife that provides 24/7 care if needed. PCP is Dr. Baltazar Apo              Action/Plan: HHPT with [facility].   Expected Discharge Date:  01/15/17               Expected Discharge Plan:  [place]  In-House Referral:     Discharge planning Services  CM Consult  Post Acute Care Choice:  Durable Medical Equipment, Home Health Choice offered to:  Patient  DME Arranged:  Walker rolling DME Agency:  [place] Arranged:  PT [facility] Agency:  [facility]  Status of Service:  Completed, signed off  If discussed at [place] of Stay Meetings, dates discussed:    Additional Comments:  Jolly Mango, RN 01/15/2017, 10:53 AM

## 2017-01-15 NOTE — Discharge Summary (Signed)
[facility] [place] NAME: George Bolton    MR#:  [id number]  DATE OF BIRTH:  [birth year]  DATE OF ADMISSION:  01/11/2017 ADMITTING PHYSICIAN: Fritzi Mandes, MD  DATE OF DISCHARGE: 01/15/2017  PRIMARY CARE PHYSICIAN: Baltazar Apo, MD    ADMISSION DIAGNOSIS:  Cholecystitis [K81.9] Abdominal pain [R10.9]  DISCHARGE DIAGNOSIS:  Active Problems:   Acute cholecystitis   SECONDARY DIAGNOSIS:   Past Medical History:  Diagnosis Date  . A-fib ([place])   . CHF (congestive heart failure) ([place])   . Gout   . Hypertension   . Irregular heart beat     HOSPITAL COURSE:   76 year old male with past medical history of hypertension, atrial fibrillation, history of congestive heart failure, history of gout who presented to the hospital due to abdominal pain and noted to have acute cholecystitis.  1. Abdominal pain/epigastric pain secondary to acute gangrenous cholecystitis/cholelithiasis. -Seen by surgery and is status post laparoscopic cholecystectomy postop day #2. -Pain is well controlled, patient is to go home with the JP drain which is draining only serosanguineous fluid. No heavy lifting for 6 weeks and follow-up with Gen. surgery next week. -As per surgery no need for further antibiotics, while in the hospital patient was on IV Unasyn.  2. Rapid A. fib with RVR - rates are much better controlled now. Patient was in A. fib with RVR on admission due to his abdominal pain and acute cholecystitis. - She will continue atenolol for rate control, continue aspirin for anticoagulation. Seen by cardiology while in the hospital and follow-up with them as an outpatient after 2 weeks from discharge  3. Hypertension-patient will resume his felodipine, Hydralazine, atenolol.  4. Hyperlipidemia - pt. Will resume his Atorvastatin.   Pt. Is being discharged home with Home Health PT  DISCHARGE CONDITIONS:   Stable.   CONSULTS OBTAINED:  Treatment Team:   Jules Husbands, MD Corey Skains, MD  DRUG ALLERGIES:   Allergies  Allergen Reactions  . Cortisone     DISCHARGE MEDICATIONS:   Allergies as of 01/15/2017      Reactions   Cortisone       Medication List    TAKE these medications   aspirin 325 MG tablet Take 325 mg by mouth daily.   atenolol 50 MG tablet Commonly known as:  TENORMIN Take 1 tablet by mouth daily.   atorvastatin 40 MG tablet Commonly known as:  LIPITOR Take 1 tablet by mouth daily.   felodipine 10 MG 24 hr tablet Commonly known as:  PLENDIL Take 1 tablet by mouth daily.   furosemide 40 MG tablet Commonly known as:  LASIX Take 1 tablet by mouth as needed.   hydrALAZINE 50 MG tablet Commonly known as:  APRESOLINE Take 1 tablet by mouth 3 (three) times daily.            Durable Medical Equipment        Start     Ordered   01/15/17 1033  For home use only DME Walker rolling  Once    Question:  Patient needs a walker to treat with the following condition  Answer:  Weakness   01/15/17 1033        DISCHARGE INSTRUCTIONS:   DIET:  Low fat, Low cholesterol diet  DISCHARGE CONDITION:  Stable  ACTIVITY:  Activity as tolerated  OXYGEN:  Home Oxygen: No.   Oxygen Delivery: room air  DISCHARGE LOCATION:  Home with Home Health PT.  If you experience worsening of your admission symptoms, develop shortness of breath, life threatening emergency, suicidal or homicidal thoughts you must seek medical attention immediately by calling 911 or calling your MD immediately  if symptoms less severe.  You Must read complete instructions/literature along with all the possible adverse reactions/side effects for all the Medicines you take and that have been prescribed to you. Take any new Medicines after you have completely understood and accpet all the possible adverse reactions/side effects.   Please note  You were cared for by a hospitalist during your hospital stay. If you have any  questions about your discharge medications or the care you received while you were in the hospital after you are discharged, you can call the unit and asked to speak with the hospitalist on call if the hospitalist that took care of you is not available. Once you are discharged, your primary care physician will handle any further medical issues. Please note that NO REFILLS for any discharge medications will be authorized once you are discharged, as it is imperative that you return to your primary care physician (or establish a relationship with a primary care physician if you do not have one) for your aftercare needs so that they can reassess your need for medications and monitor your lab values.     Today   Having some left knee pain.  NO abdominal pain, N/V. JP drain with minimal serosanguineous drainage  VITAL SIGNS:  Blood pressure (!) 116/102, pulse 91, temperature 98.1 F (36.7 C), temperature source Oral, resp. rate 20, height 6\' 1"  (1.854 m), weight 89.8 kg (198 lb), SpO2 96 %.  I/O:   Intake/Output Summary (Last 24 hours) at 01/15/17 1452 Last data filed at 01/15/17 0810  Gross per 24 hour  Intake              118 ml  Output               55 ml  Net               63 ml    PHYSICAL EXAMINATION:  GENERAL:  76 y.o.-year-old patient lying in the bed with no acute distress.  EYES: Pupils equal, round, reactive to light and accommodation. No scleral icterus. Extraocular muscles intact.  HEENT: Head atraumatic, normocephalic. Oropharynx and nasopharynx clear.  NECK:  Supple, no jugular venous distention. No thyroid enlargement, no tenderness.  LUNGS: Normal breath sounds bilaterally, no wheezing, rales,rhonchi. No use of accessory muscles of respiration.  CARDIOVASCULAR: S1, S2 normal. No murmurs, rubs, or gallops.  ABDOMEN: Soft, non-tender, non-distended. Bowel sounds present. No organomegaly or mass. Positive JP drain with serosanguineous drainage. EXTREMITIES: No pedal edema,  cyanosis, or clubbing. Mild Left knee swelling but no redness.  NEUROLOGIC: Cranial nerves II through XII are intact. No focal motor or sensory defecits b/l.  PSYCHIATRIC: The patient is alert and oriented x 3. Good affect.  SKIN: No obvious rash, lesion, or ulcer.   DATA REVIEW:   CBC  Recent Labs Lab 01/14/17 0620  WBC 8.2  HGB 13.7  HCT 40.2  PLT 198    Chemistries   Recent Labs Lab 01/14/17 0620  NA 139  K 3.7  CL 110  CO2 19*  GLUCOSE 88  BUN 16  CREATININE 1.41*  CALCIUM 8.5*  AST 42*  ALT 17  ALKPHOS 68  BILITOT 1.3*    Cardiac Enzymes  Recent Labs Lab 01/11/17 1127  TROPONINI 0.03*    Microbiology Results  No results found for this or any previous visit.  RADIOLOGY:  No results found.    Management plans discussed with the patient, family and they are in agreement.  CODE STATUS:     Code Status Orders        Start     Ordered   01/11/17 1753  Full code  Continuous     01/11/17 1752    Code Status History    Date Active Date Inactive Code Status Order ID Comments User Context   This patient has a current code status but no historical code status.      TOTAL TIME TAKING CARE OF THIS PATIENT: 40 minutes.    Henreitta Leber M.D on 01/15/2017 at 2:52 PM  Between 7am to 6pm - Pager - [phone]  After 6pm go to [email or web address] - Proofreader  [facility]  Office  [phone]  CC: Primary care physician; Baltazar Apo, MD

## 2017-01-15 NOTE — Progress Notes (Signed)
01/15/2017 2:12 PM  [name] to be D/C'd Home per MD order.  Discussed prescriptions and follow up appointments with the patient. Prescriptions given to patient, medication list explained in detail. Pt verbalized understanding.  Allergies as of 01/15/2017      Reactions   Cortisone       Medication List    TAKE these medications   aspirin 325 MG tablet Take 325 mg by mouth daily.   atenolol 50 MG tablet Commonly known as:  TENORMIN Take 1 tablet by mouth daily.   atorvastatin 40 MG tablet Commonly known as:  LIPITOR Take 1 tablet by mouth daily.   felodipine 10 MG 24 hr tablet Commonly known as:  PLENDIL Take 1 tablet by mouth daily.   furosemide 40 MG tablet Commonly known as:  LASIX Take 1 tablet by mouth as needed.   hydrALAZINE 50 MG tablet Commonly known as:  APRESOLINE Take 1 tablet by mouth 3 (three) times daily.            Durable Medical Equipment        Start     Ordered   01/15/17 1033  For home use only DME Walker rolling  Once    Question:  Patient needs a walker to treat with the following condition  Answer:  Weakness   01/15/17 1033      Vitals:   01/15/17 1005 01/15/17 1206  BP: (!) 151/83 (!) 116/102  Pulse: 96 91  Resp:  20  Temp:  98.1 F (36.7 C)    Skin clean, dry and intact without evidence of skin break down, no evidence of skin tears noted. IV catheter discontinued intact. Site without signs and symptoms of complications. Dressing and pressure applied. Pt denies pain at this time. No complaints noted.  An After Visit Summary was printed and given to the patient. Patient escorted via [place], and D/C home via private auto.  Dola Argyle

## 2017-01-18 ENCOUNTER — Emergency Department
Admission: EM | Admit: 2017-01-18 | Discharge: 2017-01-18 | Disposition: A | Discharge status: Home / Self Care | Payer: Medicare Other | Attending: Emergency Medicine | Admitting: Emergency Medicine

## 2017-01-18 ENCOUNTER — Telehealth: Payer: Self-pay

## 2017-01-18 DIAGNOSIS — I110 Hypertensive heart disease with heart failure: Secondary | ICD-10-CM | POA: Insufficient documentation

## 2017-01-18 DIAGNOSIS — T8189XA Other complications of procedures, not elsewhere classified, initial encounter: Secondary | ICD-10-CM | POA: Diagnosis present

## 2017-01-18 DIAGNOSIS — I509 Heart failure, unspecified: Secondary | ICD-10-CM | POA: Diagnosis not present

## 2017-01-18 DIAGNOSIS — Z7982 Long term (current) use of aspirin: Secondary | ICD-10-CM | POA: Insufficient documentation

## 2017-01-18 DIAGNOSIS — Z79899 Other long term (current) drug therapy: Secondary | ICD-10-CM | POA: Insufficient documentation

## 2017-01-18 DIAGNOSIS — T85698A Other mechanical complication of other specified internal prosthetic devices, implants and grafts, initial encounter: Secondary | ICD-10-CM

## 2017-01-18 DIAGNOSIS — Y69 Unspecified misadventure during surgical and medical care: Secondary | ICD-10-CM | POA: Insufficient documentation

## 2017-01-18 DIAGNOSIS — Z4889 Encounter for other specified surgical aftercare: Secondary | ICD-10-CM | POA: Insufficient documentation

## 2017-01-18 LAB — COMPREHENSIVE METABOLIC PANEL
ALT: 19 U/L (ref 17–63)
AST: 27 U/L (ref 15–41)
Albumin: 3.2 g/dL — ABNORMAL LOW (ref 3.5–5.0)
Alkaline Phosphatase: 83 U/L (ref 38–126)
Anion gap: 9 (ref 5–15)
BUN: 15 mg/dL (ref 6–20)
CO2: 24 mmol/L (ref 22–32)
Calcium: 8.9 mg/dL (ref 8.9–10.3)
Chloride: 104 mmol/L (ref 101–111)
Creatinine, Ser: 1.22 mg/dL (ref 0.61–1.24)
GFR calc Af Amer: >60 mL/min (ref >60)
GFR calc non Af Amer: 55 mL/min — ABNORMAL LOW (ref >60)
Glucose, Bld: 125 mg/dL — ABNORMAL HIGH (ref 65–99)
Potassium: 3.2 mmol/L — ABNORMAL LOW (ref 3.5–5.1)
Sodium: 137 mmol/L (ref 135–145)
Total Bilirubin: 0.8 mg/dL (ref 0.3–1.2)
Total Protein: 7.2 g/dL (ref 6.5–8.1)

## 2017-01-18 LAB — CBC WITH DIFFERENTIAL/PLATELET
Basophils Absolute: 0.1 10*3/uL (ref 0–0.1)
Basophils Relative: 1 %
Eosinophils Absolute: 0.5 10*3/uL (ref 0–0.7)
Eosinophils Relative: 4 %
HCT: 41.2 % (ref 40.0–52.0)
Hemoglobin: 13.5 g/dL (ref 13.0–18.0)
Lymphocytes Relative: 13 %
Lymphs Abs: 1.5 10*3/uL (ref 1.0–3.6)
MCH: 28 pg (ref 26.0–34.0)
MCHC: 32.9 g/dL (ref 32.0–36.0)
MCV: 85.2 fL (ref 80.0–100.0)
Monocytes Absolute: 1 10*3/uL (ref 0.2–1.0)
Monocytes Relative: 9 %
Neutro Abs: 8.6 10*3/uL — ABNORMAL HIGH (ref 1.4–6.5)
Neutrophils Relative %: 73 %
Platelets: 311 10*3/uL (ref 150–440)
RBC: 4.83 MIL/uL (ref 4.40–5.90)
RDW: 15 % — ABNORMAL HIGH (ref 11.5–14.5)
WBC: 11.8 10*3/uL — ABNORMAL HIGH (ref 3.8–10.6)

## 2017-01-18 NOTE — Telephone Encounter (Signed)
Merry Proud from home health aid called because the patient is bleeding into the gauze from his Jp drain and it seems as if nothing is coming out. I advised Merry Proud to take Simran to the ER. There could be a blockage in the drain or some other underlining issue. Merry Proud mentioned that the patient does not want to go to the ER but I heard him give the instructions to the patient. The patient has been advised to keep his appointment on 01/27/17 with Dr. Hampton Abbot until further notice.

## 2017-01-18 NOTE — ED Triage Notes (Signed)
Pt had gall bladder surgery last Wednesday and was discharge on Friday with JP drain, states there has been more drainage around the incision than in the JP drain since.George Bolton

## 2017-01-18 NOTE — ED Notes (Signed)
Pt reports that he had gallbladder surgery on Thursday and since then he has been having bloody drainage from incision site. JP drain present.

## 2017-01-18 NOTE — ED Provider Notes (Signed)
[facility] Emergency Department Provider Note  ____________________________________________   First MD Initiated Contact with Patient 01/18/17 1739     (approximate)  I have reviewed the triage vital signs and the nursing notes.   HISTORY  Chief Complaint JP drain tube issue   HPI George Bolton is a 76 y.o. male With a history of atrial fibrillation and CHF was present to the emergency department with drainage from around his JP drain tubing that he had placed last week after acholecystectomy. He patient is denying any abdominal pain, vomiting or nausea at this time. He says that he has had the drainage around the JP drain tubing since being discharged from the hospital. He says that the bloody quality of the drainage is also not  Changed.   Past Medical History:  Diagnosis Date  . A-fib ([place])   . CHF (congestive heart failure) ([place])   . Gout   . Hypertension   . Irregular heart beat     Patient Active Problem List   Diagnosis Date Noted  . Acute cholecystitis 01/11/2017  . Cholecystitis     Past Surgical History:  Procedure Laterality Date  . CHOLECYSTECTOMY N/A 01/13/2017   Procedure: LAPAROSCOPIC CHOLECYSTECTOMY;  Surgeon: Jules Husbands, MD;  Location: [facility] ORS;  Service: General;  Laterality: N/A;    Prior to Admission medications   Medication Sig Start Date End Date Taking? Authorizing Provider  aspirin 325 MG tablet Take 325 mg by mouth daily.    Historical Provider, MD  atenolol (TENORMIN) 50 MG tablet Take 1 tablet by mouth daily. 01/09/17   Historical Provider, MD  atorvastatin (LIPITOR) 40 MG tablet Take 1 tablet by mouth daily.    Historical Provider, MD  felodipine (PLENDIL) 10 MG 24 hr tablet Take 1 tablet by mouth daily.    Historical Provider, MD  furosemide (LASIX) 40 MG tablet Take 1 tablet by mouth as needed.     Historical Provider, MD  hydrALAZINE (APRESOLINE) 50 MG tablet Take 1 tablet by mouth 3 (three) times daily.  12/26/16   Historical Provider, MD    Allergies Cortisone  No family history on file.  Social History Social History  Substance Use Topics  . Smoking status: Never Smoker  . Smokeless tobacco: Never Used  . Alcohol use No    Review of Systems Constitutional: No fever/chills Eyes: No visual changes. ENT: No sore throat. Cardiovascular: Denies chest pain. Respiratory: Denies shortness of breath. Gastrointestinal: No abdominal pain.  No nausea, no vomiting.  No diarrhea.  No constipation. Genitourinary: Negative for dysuria. Musculoskeletal: Negative for back pain. Skin: Negative for rash. Neurological: Negative for headaches, focal weakness or numbness.  10-point ROS otherwise negative.  ____________________________________________   PHYSICAL EXAM:  VITAL SIGNS: ED Triage Vitals  Enc Vitals Group     BP 01/18/17 1411 138/86     Pulse Rate 01/18/17 1411 86     Resp 01/18/17 1411 18     Temp 01/18/17 1411 97.9 F (36.6 C)     Temp Source 01/18/17 1411 Oral     SpO2 01/18/17 1411 99 %     Weight 01/18/17 1412 202 lb (91.6 kg)     Height 01/18/17 1412 6' (1.829 m)     Head Circumference --      Peak Flow --      Pain Score 01/18/17 1411 0     Pain Loc --      Pain Edu? --  Excl. in [place]? --     Constitutional: Alert and oriented. Well appearing and in no acute distress. Eyes: Conjunctivae are normal. PERRL. EOMI. Head: Atraumatic. Nose: No congestion/rhinnorhea. Mouth/Throat: Mucous membranes are moist.  Neck: No stridor.   Cardiovascular: Normal rate, regular rhythm. Grossly normal heart sounds.   Respiratory: Normal respiratory effort.  No retractions. Lungs CTAB. Gastrointestinal: Soft and nontender. No distention.   Patient with right lower abdominal JP drain with serous drainage of about 15-20 cc in the grenade. There is no active drainage from around the tubing at this time with the patient's shirt is stained. No bilious drainage. No  pus.  Musculoskeletal: No lower extremity tenderness.  Bilateral moderate to severe lower extremity edema which the patient sanic and uncha  No joint effusions. Neurologic:  Normal speech and language. No gross focal neurologic deficits are appreciated.  Skin:  Skin is warm, dry and intact. No rash noted. Psychiatric: Mood and affect are normal. Speech and behavior are normal.  ____________________________________________   LABS (all labs ordered are listed, but only abnormal results are displayed)  Labs Reviewed  CBC WITH DIFFERENTIAL/PLATELET - Abnormal; Notable for the following:       Result Value   WBC 11.8 (*)    RDW 15.0 (*)    Neutro Abs 8.6 (*)    All other components within normal limits  COMPREHENSIVE METABOLIC PANEL - Abnormal; Notable for the following:    Potassium 3.2 (*)    Glucose, Bld 125 (*)    Albumin 3.2 (*)    GFR calc non Af Amer 55 (*)    All other components within normal limits   ____________________________________________  EKG   ____________________________________________  RADIOLOGY   ____________________________________________   PROCEDURES  Procedure(s) performed:   Procedures  Critical Care performed:   ____________________________________________   INITIAL IMPRESSION / ASSESSMENT AND PLAN / ED COURSE  Pertinent labs & imaging results that were available during my care of the patient were reviewed by me and considered in my medical decision making (see chart for details).  ----------------------------------------- 620 PM on 01/18/2017 ----------------------------------------- Discussed case with Dr. Burt Knack who recommends removing the JP drain as long as the patient's  Labs appear reassuring, with special note on the patient's liver labs as well asbilirubin.     ----------------------------------------- 7:57 PM on 01/18/2017 -----------------------------------------  Patient with reassuring lab work. Slightly elevated  white blood cell count. However, the patient's liver labs as well as bilirubin reassuring as well as hemoglobin. Able to remove the JP drain at the bedside without issue. Suture removed initially. Blood found to be clotted in the most proximal portion of the drain which is likely why the patient had been draining from around the tubing on his abdominal wall. No drainage from the insertion site after the drain was fully removed. No bleeding. Patient remains asymptomatic. Has follow-up with the surgeon on April 1. Patient as well as family are understanding of the plan and willing to comply.  ____________________________________________   FINAL CLINICAL IMPRESSION(S) / ED DIAGNOSES  Postop evaluation. JP drain mal function.    NEW MEDICATIONS STARTED DURING THIS VISIT:  New Prescriptions   No medications on file     Note:  This document was prepared using Dragon voice recognition software and may include unintentional dictation errors.    Orbie Pyo, MD 01/18/17 [id number]

## 2017-01-25 ENCOUNTER — Other Ambulatory Visit: Payer: Self-pay

## 2017-01-25 DIAGNOSIS — M109 Gout, unspecified: Secondary | ICD-10-CM | POA: Insufficient documentation

## 2017-01-27 ENCOUNTER — Ambulatory Visit (INDEPENDENT_AMBULATORY_CARE_PROVIDER_SITE_OTHER): Payer: Medicare Other | Admitting: Surgery

## 2017-01-27 ENCOUNTER — Encounter: Payer: Self-pay | Admitting: Surgery

## 2017-01-27 VITALS — BP 148/88 | HR 80 | Temp 98.1°F | Ht 72.0 in | Wt 202.0 lb

## 2017-01-27 DIAGNOSIS — Z09 Encounter for follow-up examination after completed treatment for conditions other than malignant neoplasm: Secondary | ICD-10-CM

## 2017-01-27 NOTE — Patient Instructions (Signed)
  Refrain from any heavy lifting and/or straining activity for the next 2 weeks as well as no driving.  You may submerge in water such as baths, pools, and hot tubs if desired.  Follow-up with our office if you have any questions or concerns about the wound healing.

## 2017-01-27 NOTE — Progress Notes (Signed)
01/27/2017  HPI: Patient is s/p laparoscopic cholecystectomy on 3/21 with Dr. Burt Knack.  JP drain was left in place and was removed in the ED on 3/26 as it had become clogged and leaking around it.  Since then, the patient reports doing very well and without issues.  He's got a good appetite and eating well, without nausea or vomiting, without significant pain.   Vital signs: BP (!) 148/88   Pulse 80   Temp 98.1 F (36.7 C) (Oral)   Ht 6' (1.829 m)   Wt 91.6 kg (202 lb) Comment: Wheelchair Bound weight per patient  BMI 27.40 kg/m    Physical Exam: Constitutional:  No acute distress Abdomen:  Soft, nondistended, nontender to palpation.  All incisions are clean, dry, and intact with staples in place.  Staples were removed without complications.  Assessment/Plan: 76 yo male s/p laparoscopic cholecystectomy  --Staples removed without issues. --Patient still has restriction of no heavy lifting or pushing of more than 10-15 lbs for another 2 weeks. --Patient may follow up on an as needed basis.   Melvyn Neth, [place]

## 2017-05-15 ENCOUNTER — Emergency Department
Admission: EM | Admit: 2017-05-15 | Discharge: 2017-05-15 | Disposition: A | Discharge status: Home / Self Care | Payer: Medicare Other | Attending: Emergency Medicine | Admitting: Emergency Medicine

## 2017-05-15 DIAGNOSIS — R030 Elevated blood-pressure reading, without diagnosis of hypertension: Secondary | ICD-10-CM | POA: Diagnosis not present

## 2017-05-15 DIAGNOSIS — Z5321 Procedure and treatment not carried out due to patient leaving prior to being seen by health care provider: Secondary | ICD-10-CM | POA: Diagnosis not present

## 2017-05-15 DIAGNOSIS — R1110 Vomiting, unspecified: Secondary | ICD-10-CM | POA: Insufficient documentation

## 2017-05-15 LAB — CBC
HCT: 45.6 % (ref 40.0–52.0)
Hemoglobin: 15.1 g/dL (ref 13.0–18.0)
MCH: 28.1 pg (ref 26.0–34.0)
MCHC: 33.1 g/dL (ref 32.0–36.0)
MCV: 85 fL (ref 80.0–100.0)
Platelets: 220 10*3/uL (ref 150–440)
RBC: 5.37 MIL/uL (ref 4.40–5.90)
RDW: 15.8 % — ABNORMAL HIGH (ref 11.5–14.5)
WBC: 14.3 10*3/uL — ABNORMAL HIGH (ref 3.8–10.6)

## 2017-05-15 LAB — COMPREHENSIVE METABOLIC PANEL
ALT: 57 U/L (ref 17–63)
AST: 45 U/L — ABNORMAL HIGH (ref 15–41)
Albumin: 3.8 g/dL (ref 3.5–5.0)
Alkaline Phosphatase: 78 U/L (ref 38–126)
Anion gap: 9 (ref 5–15)
BUN: 26 mg/dL — ABNORMAL HIGH (ref 6–20)
CO2: 26 mmol/L (ref 22–32)
Calcium: 9.5 mg/dL (ref 8.9–10.3)
Chloride: 107 mmol/L (ref 101–111)
Creatinine, Ser: 1.25 mg/dL — ABNORMAL HIGH (ref 0.61–1.24)
GFR calc Af Amer: >60 mL/min (ref >60)
GFR calc non Af Amer: 53 mL/min — ABNORMAL LOW (ref >60)
Glucose, Bld: 165 mg/dL — ABNORMAL HIGH (ref 65–99)
Potassium: 4.1 mmol/L (ref 3.5–5.1)
Sodium: 142 mmol/L (ref 135–145)
Total Bilirubin: 0.9 mg/dL (ref 0.3–1.2)
Total Protein: 7.2 g/dL (ref 6.5–8.1)

## 2017-05-15 LAB — LIPASE, BLOOD: Lipase: 38 U/L (ref 11–51)

## 2017-05-15 NOTE — ED Notes (Signed)
Called x 3. No answer in waiting room.

## 2017-05-15 NOTE — ED Triage Notes (Signed)
Pt came to ED via pov c/o emesis starting last night. Denies abdominal pain, sob, or chest pain. Reports recently started prednisone for gout. Bp elevated but pt did not take bp medication.

## 2017-07-27 IMAGING — US US EXTREM LOW VENOUS BILAT
1 series · 13 of 24 positions shown · non-contrast
Comparison: [DATE]

CLINICAL DATA: Chronic edema of bilateral lower extremities.



[Series 1: us extrem low venous bilat · 0.07mm/px · 13 of 59 slices shown]
[im 1/59]
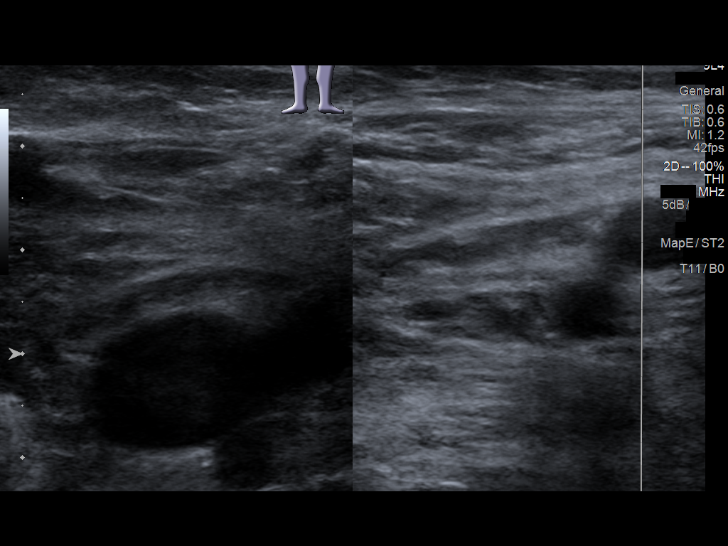
[im 6/59]
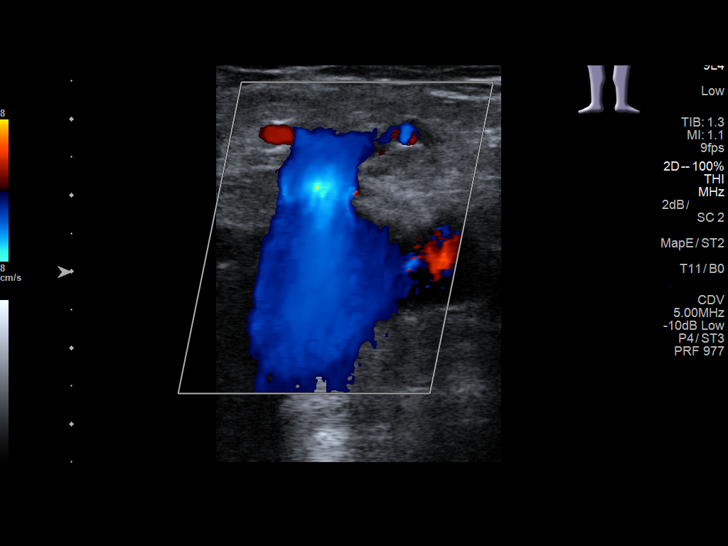
[im 11/59]
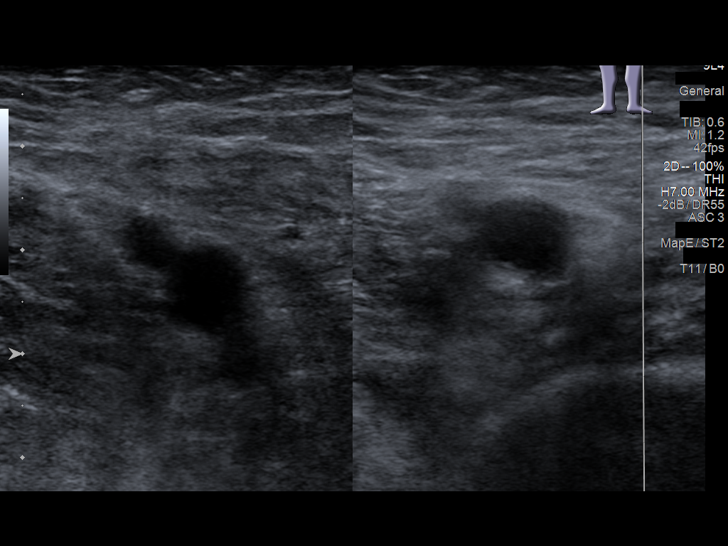
[im 16/59]
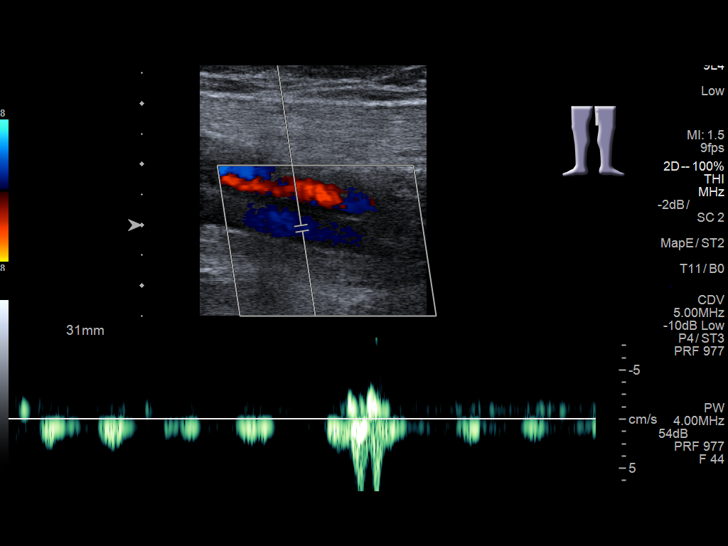
[im 21/59]
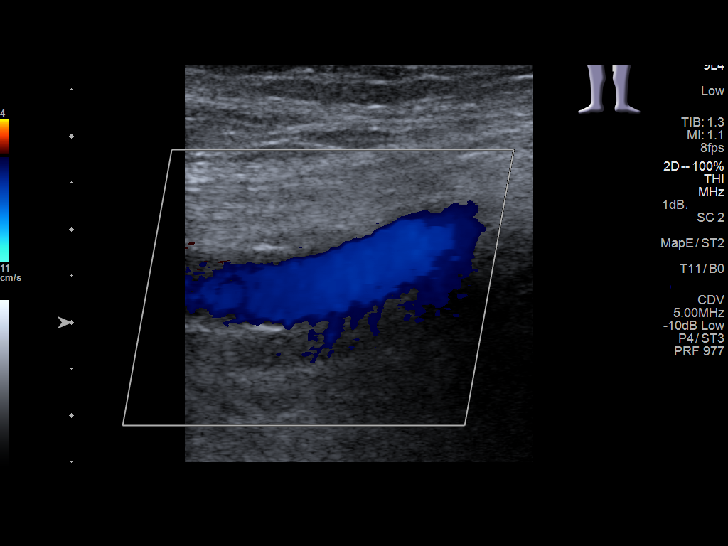
[im 26/59]
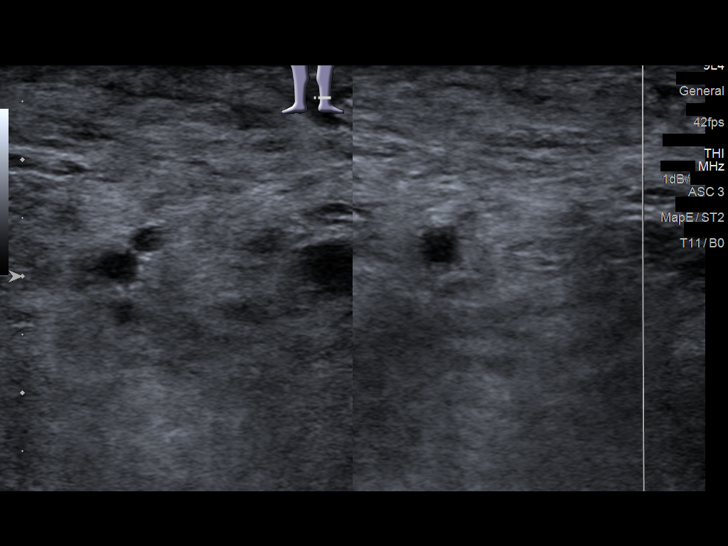
[im 31/59]
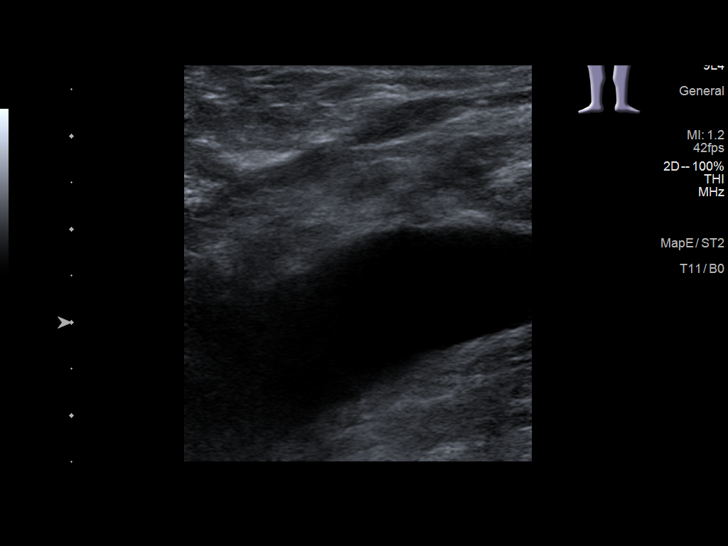
[im 33/59]
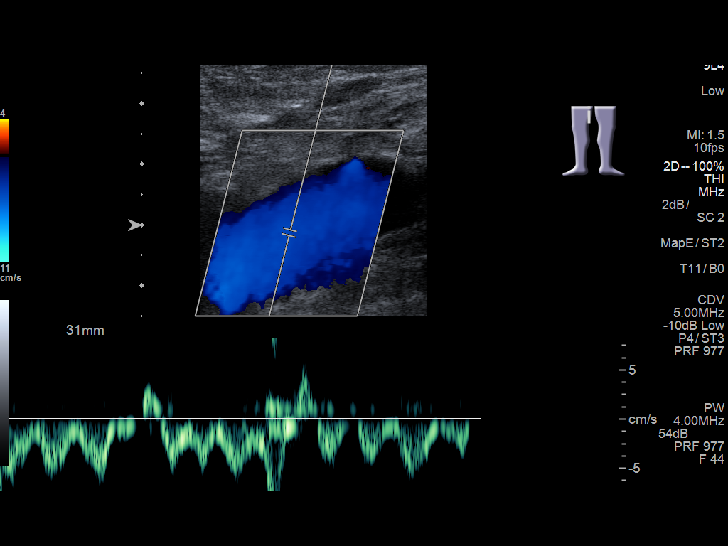
[im 38/59]
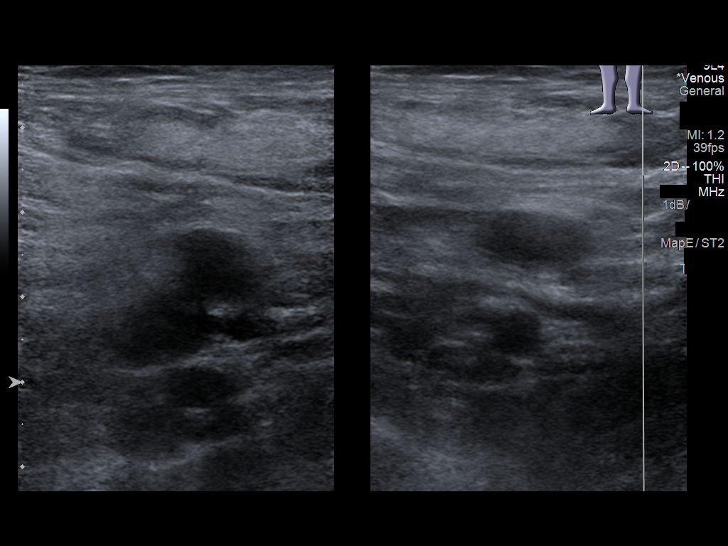
[im 43/59]
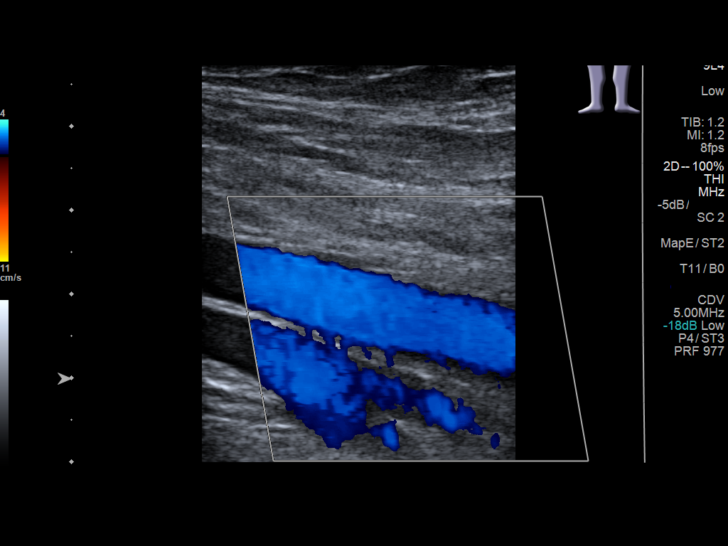
[im 48/59]
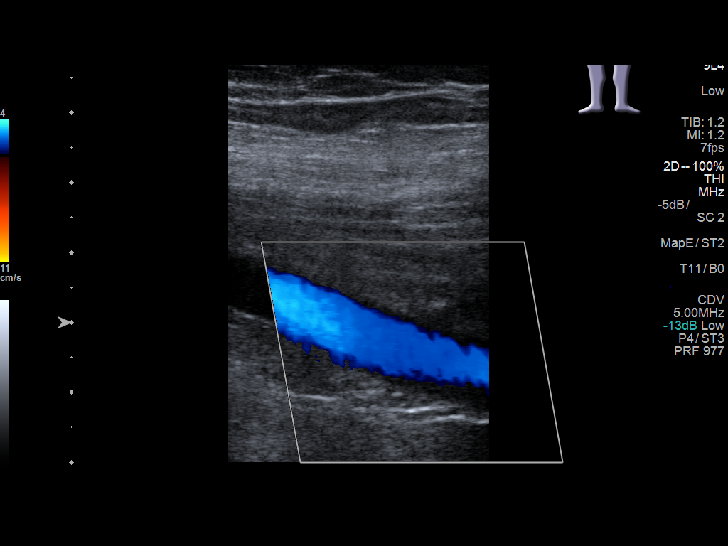
[im 53/59]
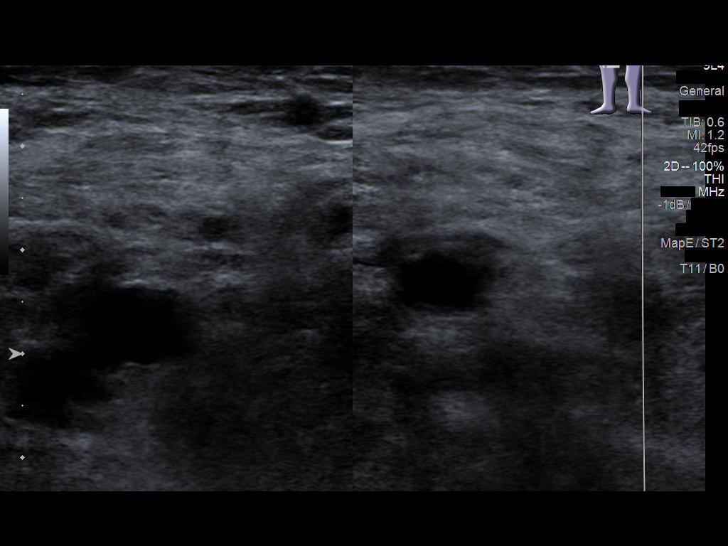
[im 59/59]
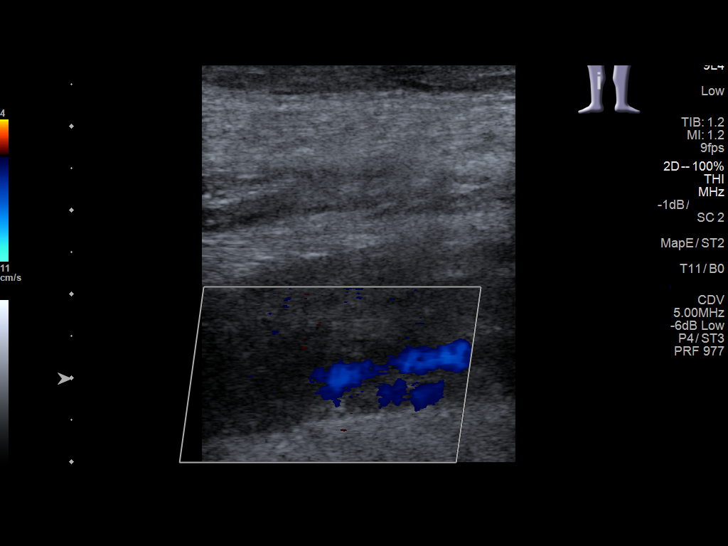

[13 of 24 positions shown; findings below may reference images not displayed]

FINDINGS: RIGHT LOWER EXTREMITY

Common Femoral Vein: No evidence of thrombus. Normal
compressibility, respiratory phasicity and response to augmentation.

Saphenofemoral Junction: No evidence of thrombus. Normal
compressibility and flow on color Doppler imaging.

Profunda Femoral Vein: No evidence of thrombus. Normal
compressibility and flow on color Doppler imaging.

Femoral Vein: No evidence of thrombus. Normal compressibility,
respiratory phasicity and response to augmentation.

Popliteal Vein: No evidence of thrombus. Normal compressibility,
respiratory phasicity and response to augmentation.

Calf Veins: No evidence of thrombus. Normal compressibility and flow
on color Doppler imaging.

Superficial Great Saphenous Vein: No evidence of thrombus. Normal
compressibility.

Venous Reflux:  None.

Other Findings: No evidence of superficial thrombophlebitis or
abnormal fluid collection.

LEFT LOWER EXTREMITY

Common Femoral Vein: No evidence of thrombus. Normal
compressibility, respiratory phasicity and response to augmentation.

Saphenofemoral Junction: No evidence of thrombus. Normal
compressibility and flow on color Doppler imaging.

Profunda Femoral Vein: No evidence of thrombus. Normal
compressibility and flow on color Doppler imaging.

Femoral Vein: No evidence of thrombus. Normal compressibility,
respiratory phasicity and response to augmentation.

Popliteal Vein: No evidence of thrombus. Normal compressibility,
respiratory phasicity and response to augmentation.

Calf Veins: No evidence of thrombus. Normal compressibility and flow
on color Doppler imaging.

Superficial Great Saphenous Vein: No evidence of thrombus. Normal
compressibility.

Venous Reflux:  None.

Other Findings: No evidence of superficial thrombophlebitis or
abnormal fluid collection.
IMPRESSION: No evidence of bilateral lower extremity deep venous thrombosis.

## 2017-07-28 IMAGING — US US RENAL
1 series · 14 of 25 positions shown · non-contrast
Comparison: [DATE] CT.

CLINICAL DATA: 76-year-old male with acute renal failure. Initial
encounter.

EXAM:
RENAL / URINARY TRACT ULTRASOUND COMPLETE

[Series 1: us renal · 0.22mm/px · 14 of 51 slices shown]
[im 1/51]
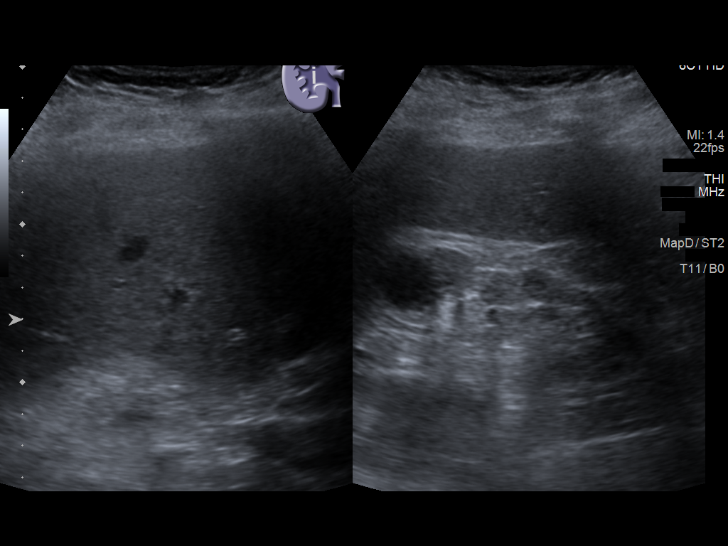
[im 5/51]
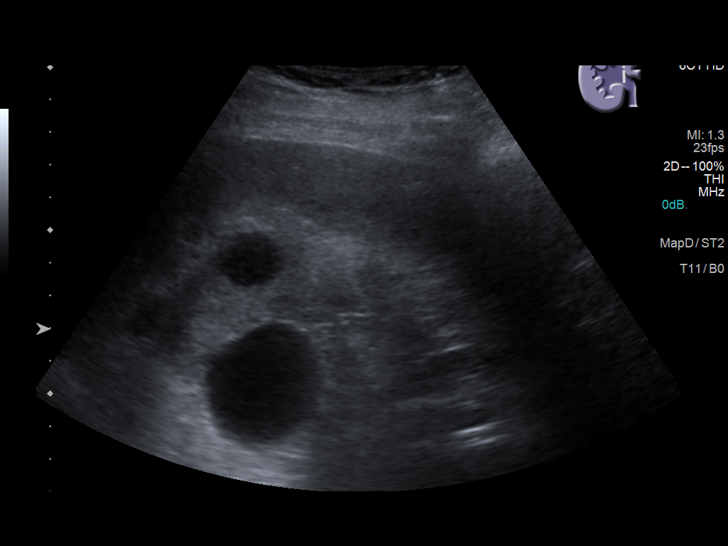
[im 9/51]
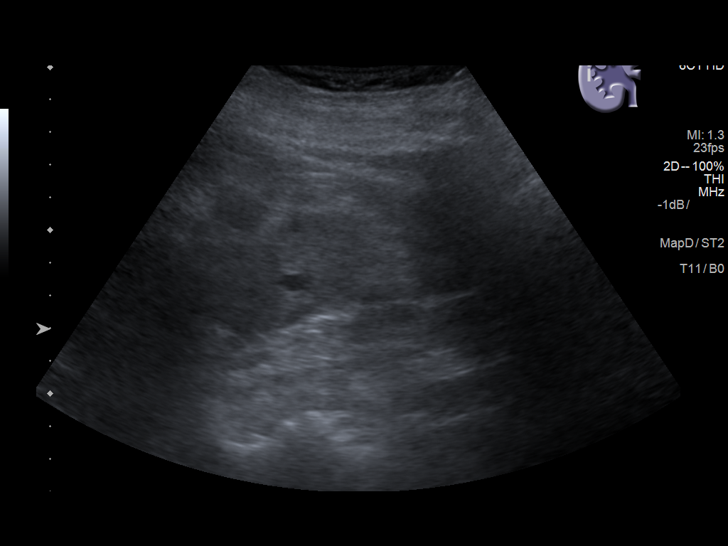
[im 13/51]
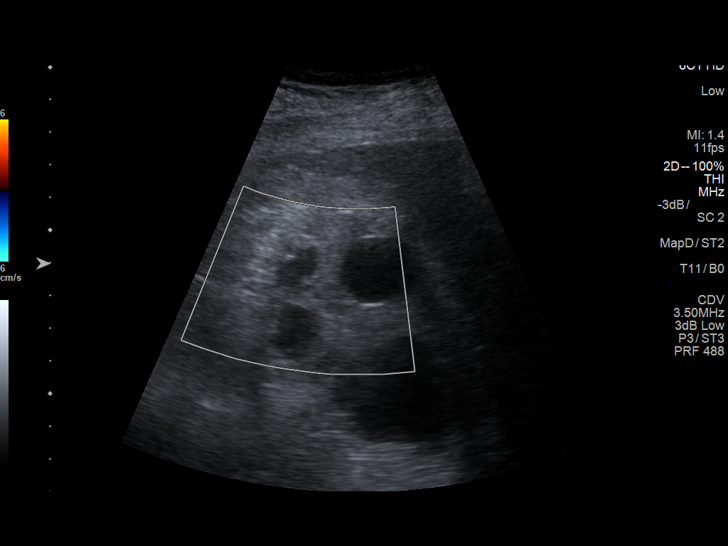
[im 17/51]
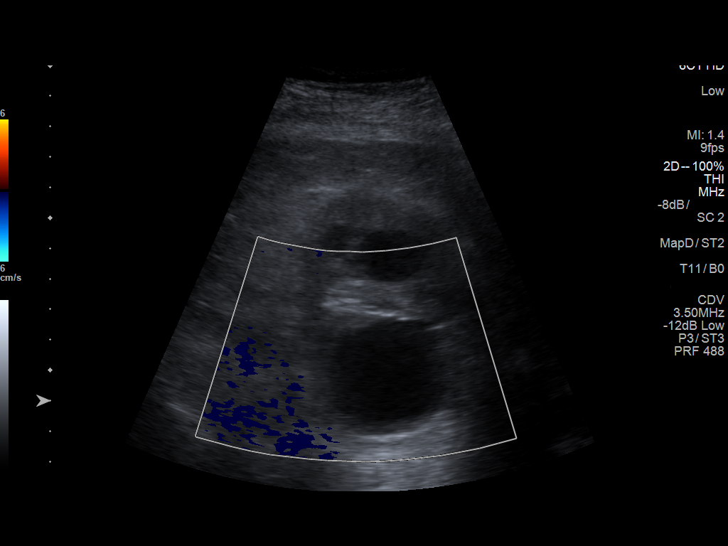
[im 19/51]
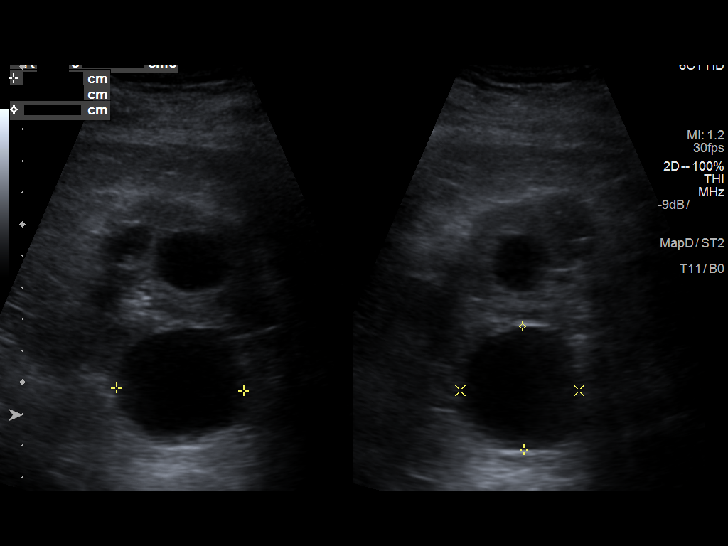
[im 23/51]
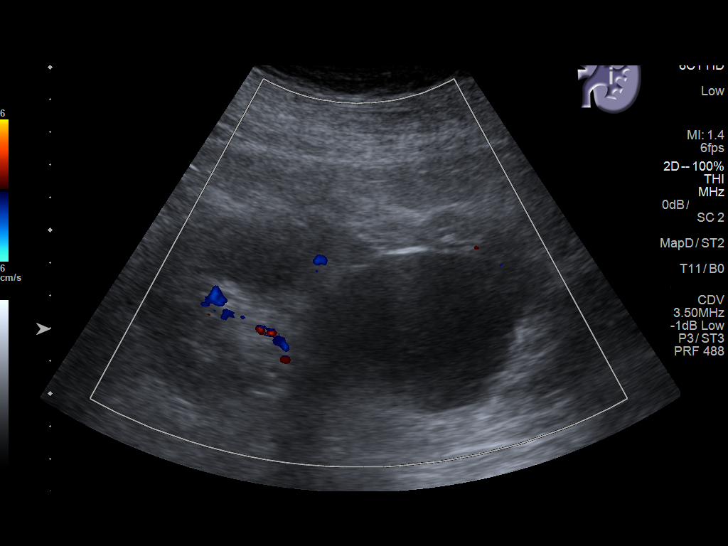
[im 28/51]
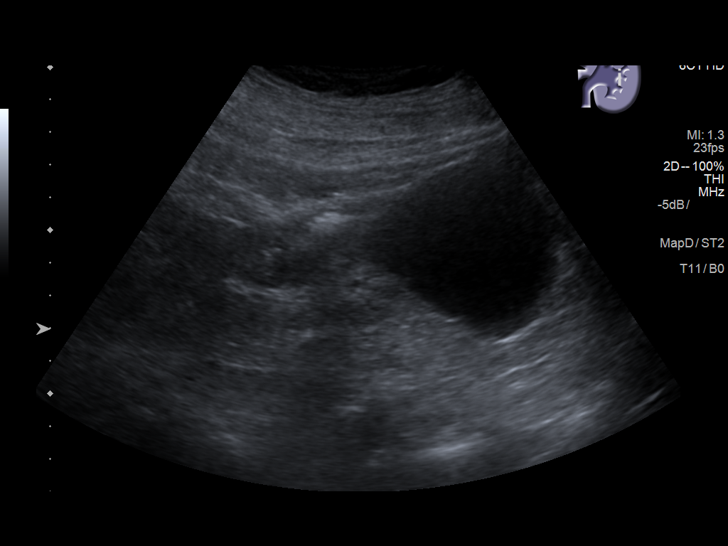
[im 32/51]
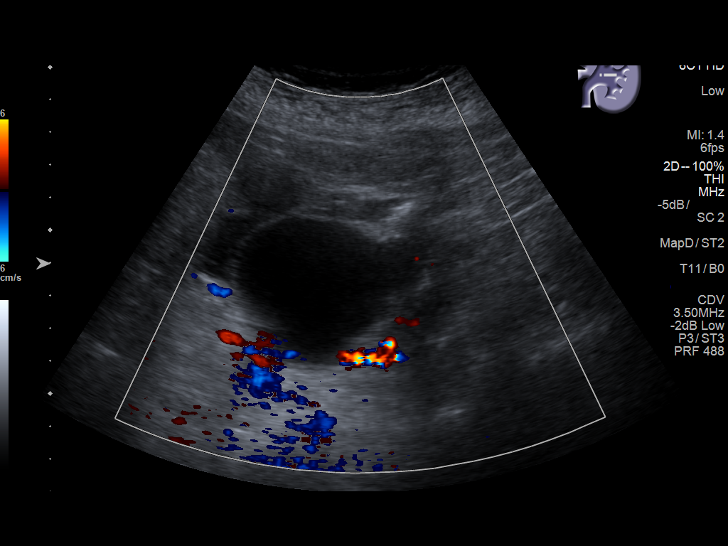
[im 34/51]
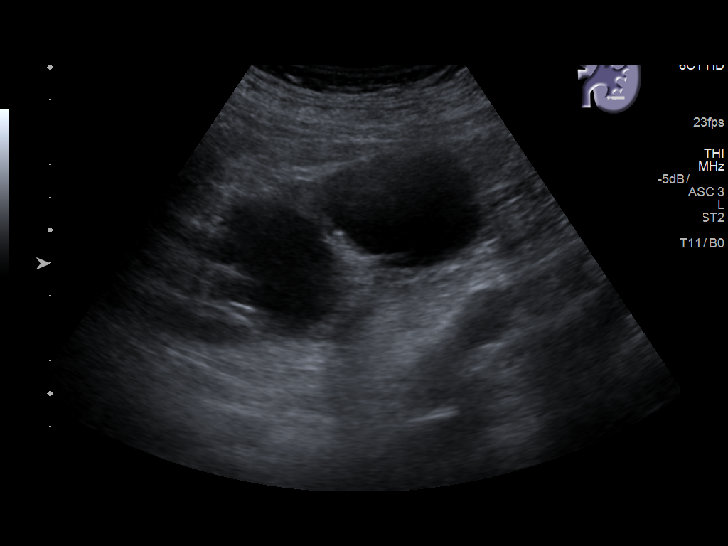
[im 38/51]
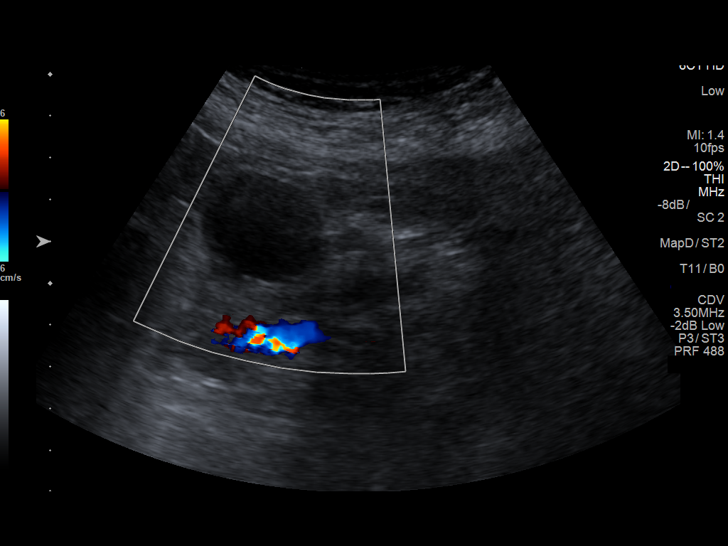
[im 42/51]
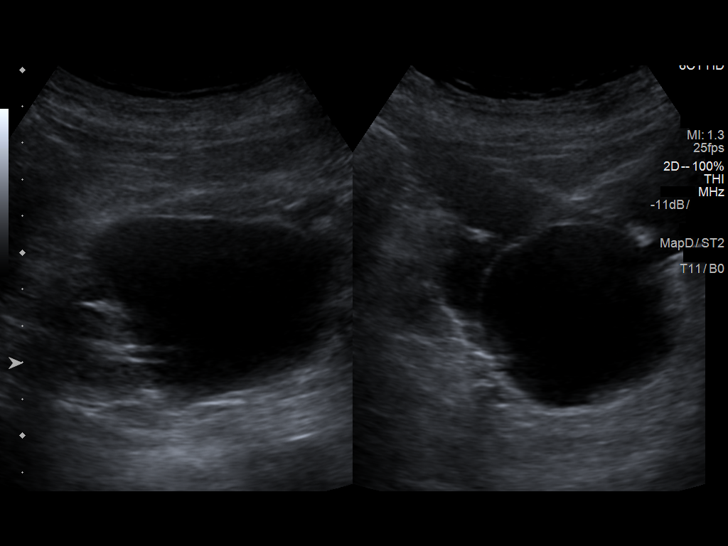
[im 46/51]
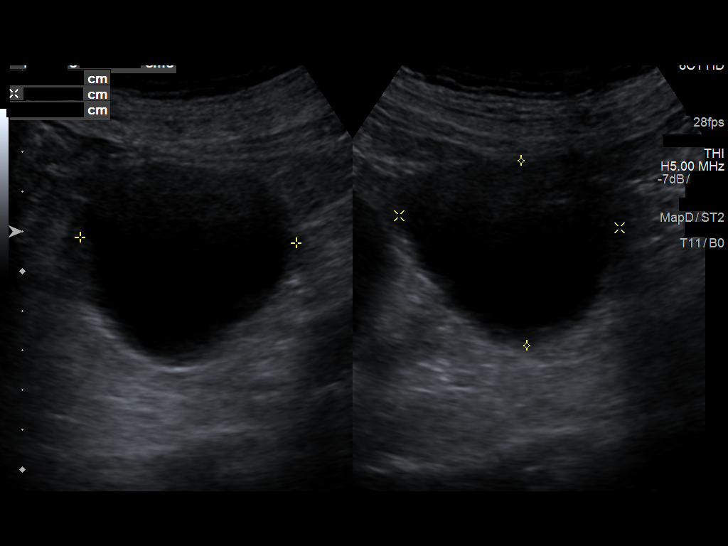
[im 51/51]
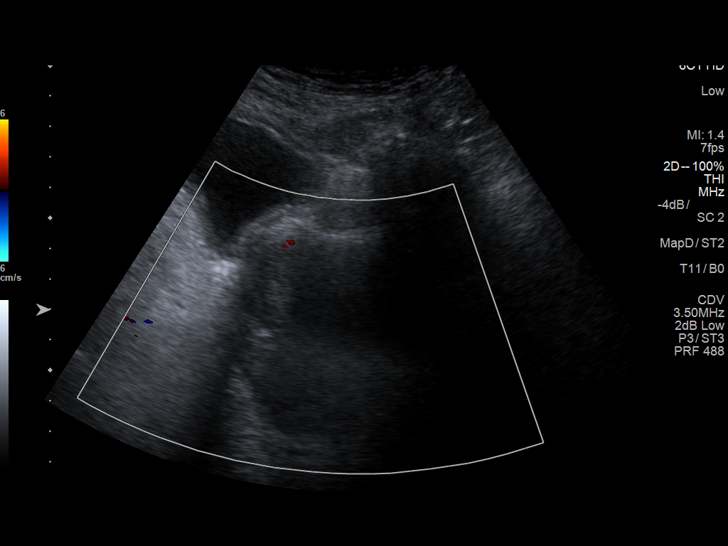

[14 of 25 positions shown; findings below may reference images not displayed]

FINDINGS: Right Kidney:

Length: 9.3 cm.. Lobulated contour. No hydronephrosis. Several renal
cysts largest measuring 4 cm and 2.4 cm. Upper pole cyst with tiny
associated calcification. CT detected lower pole cysts not as well
delineated on present ultrasound.

Left Kidney:

Length: 13.1 cm. No hydronephrosis. At least 3 cysts largest
measuring 6.3 cm and 5.6 cm.

Bladder:

Enlarged prostate gland impresses upon the bladder base.
IMPRESSION: No hydronephrosis.

Right kidney smaller than the left.

Multiple bilateral renal cysts largest on the left measures up to
6.3 cm.

Enlarged prostate gland impresses upon the bladder base. Clinical
and laboratory correlation recommended to evaluate prostate gland.

## 2018-02-08 IMAGING — US US SCROTUM W/ DOPPLER COMPLETE
1 series · 13 of 25 positions shown · non-contrast
Comparison: None

CLINICAL DATA: Scrotal pain and rash bilaterally, BILATERAL
hydrocele

EXAM:
SCROTAL ULTRASOUND
DOPPLER ULTRASOUND OF THE TESTICLES
TECHNIQUE: Complete ultrasound examination of the testicles, epididymis, and
other scrotal structures was performed. Color and spectral Doppler
ultrasound were also utilized to evaluate blood flow to the
testicles.

[Series 1: us scrotum w/ doppler complete · 0.07mm/px · 13 of 64 slices shown]
[im 1/64]
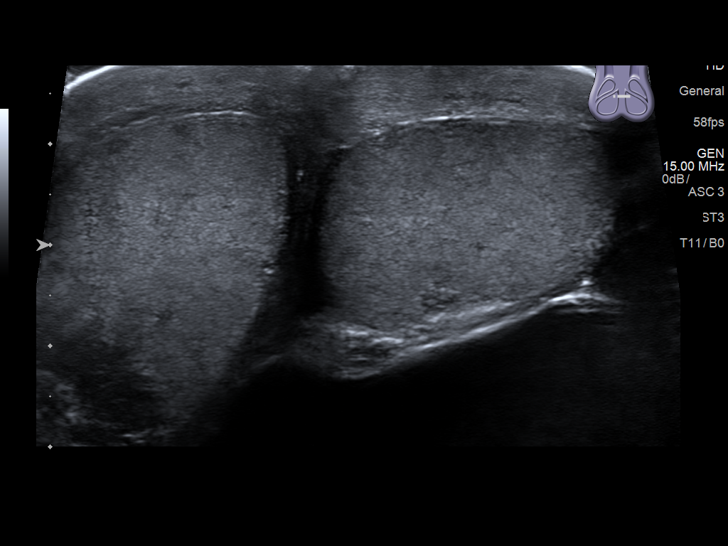
[im 6/64]
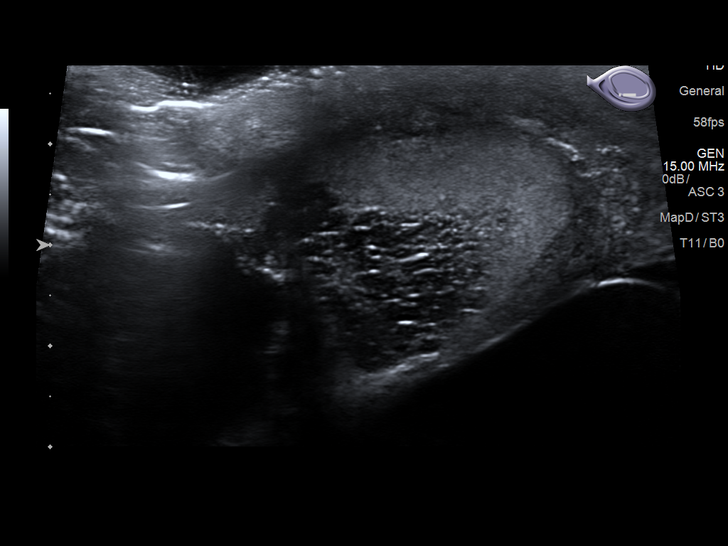
[im 11/64]
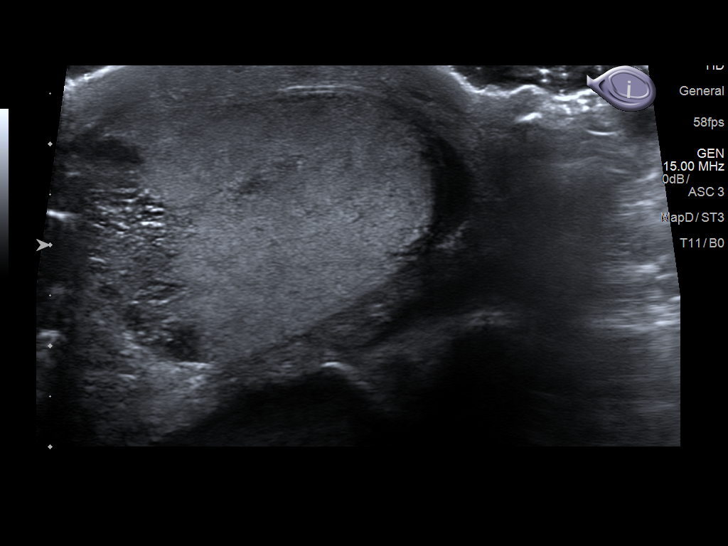
[im 16/64]
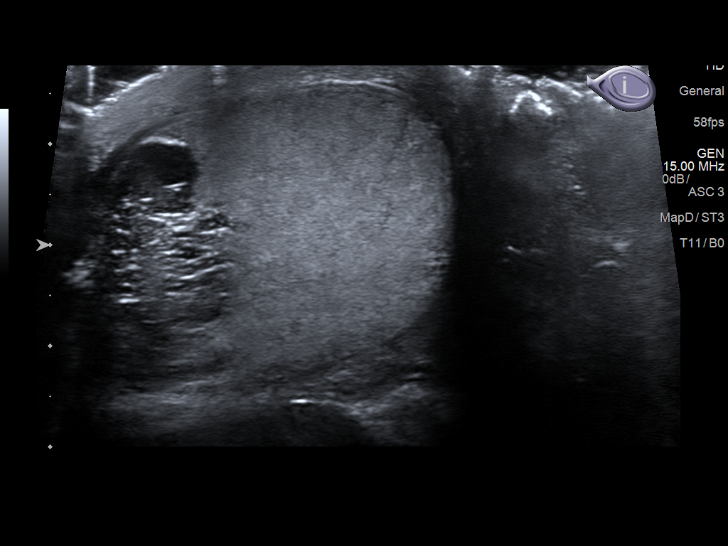
[im 22/64]
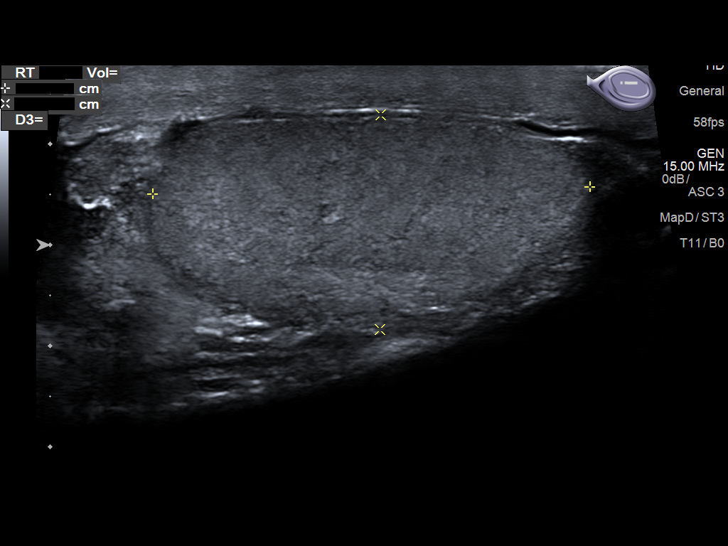
[im 27/64]
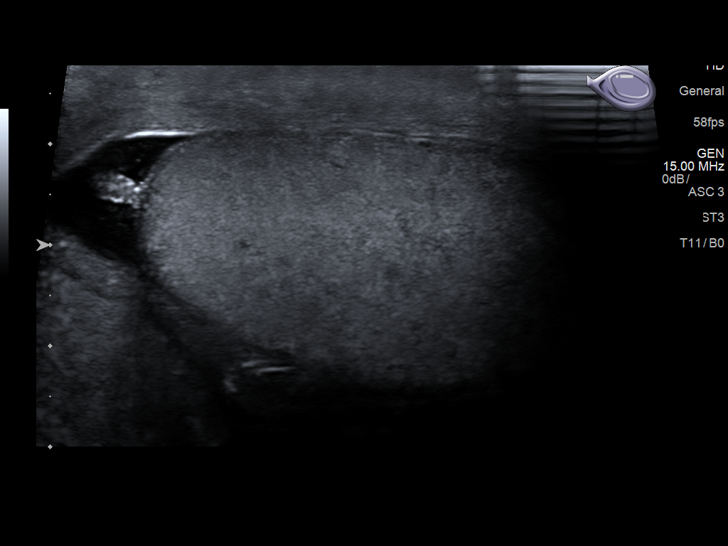
[im 32/64]
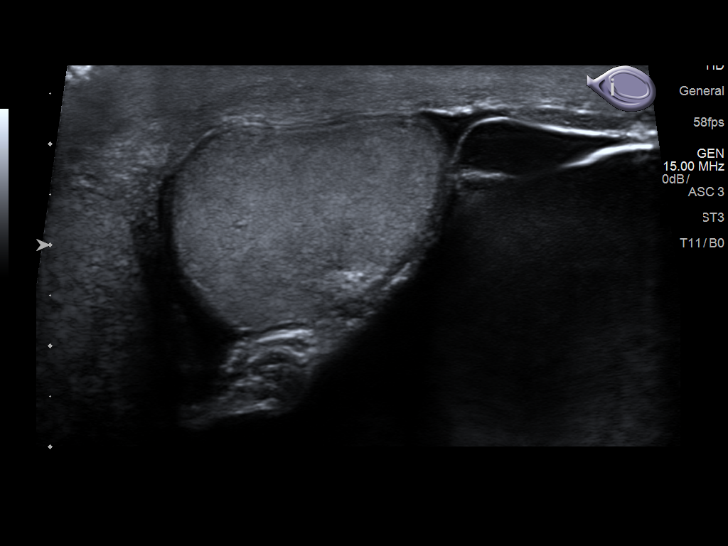
[im 37/64]
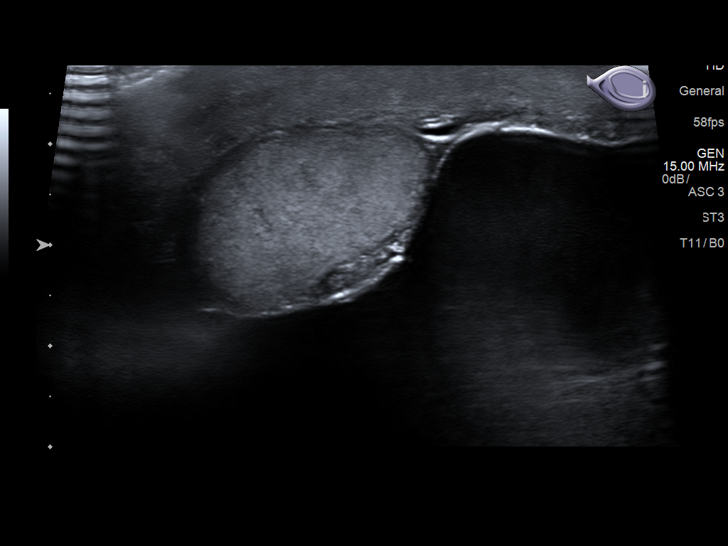
[im 43/64]
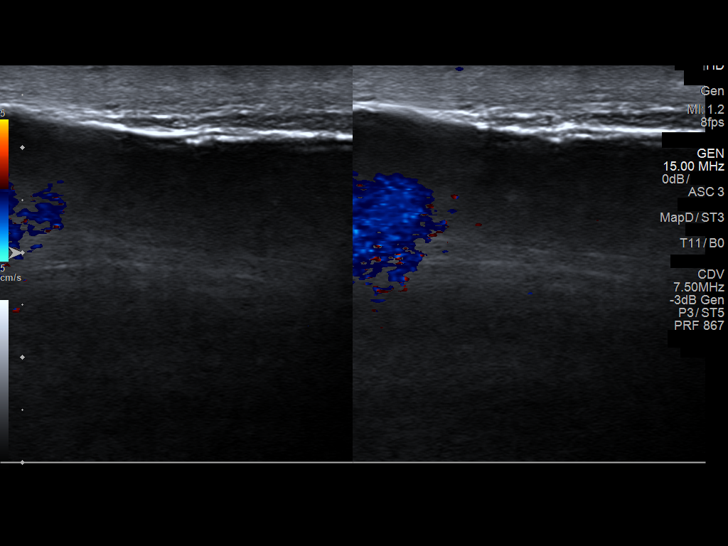
[im 48/64]
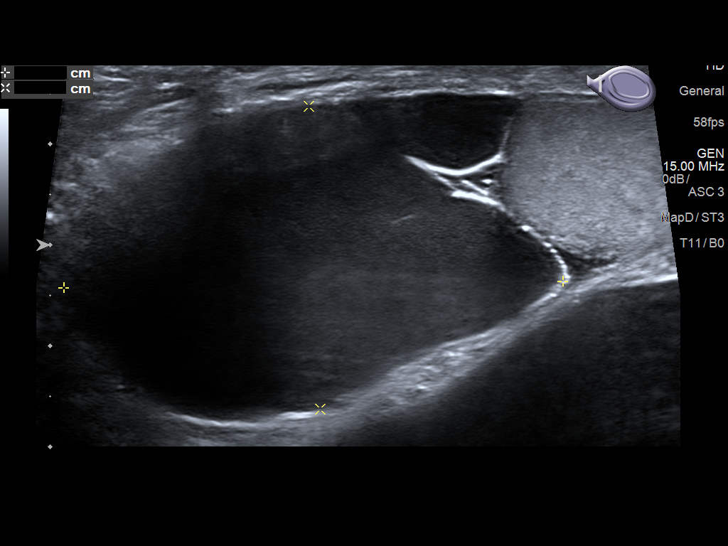
[im 53/64]
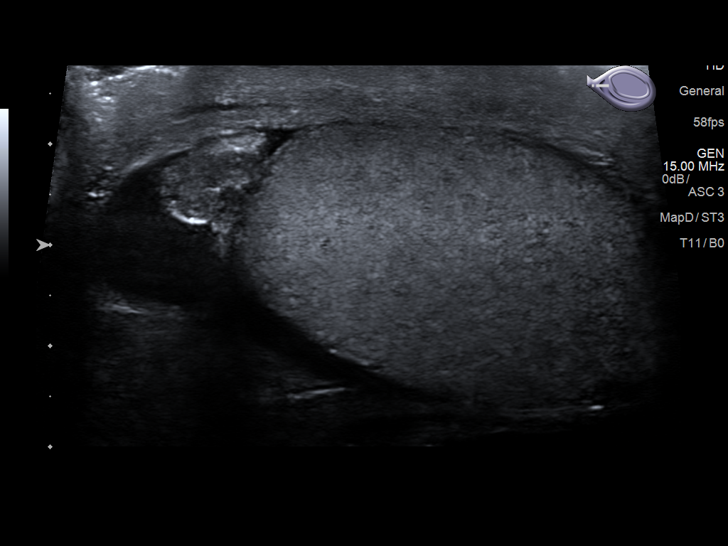
[im 58/64]
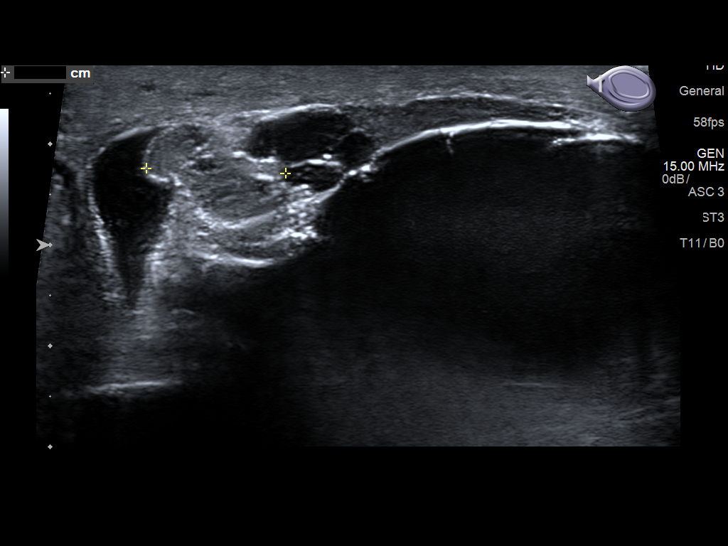
[im 64/64]
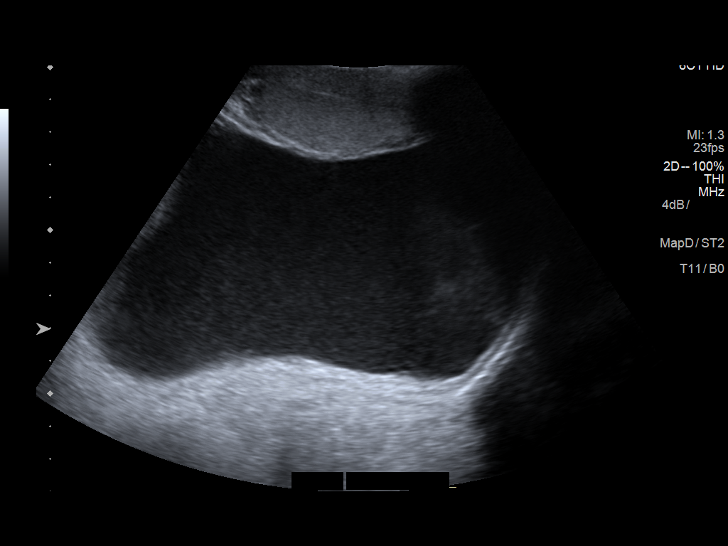

[13 of 25 positions shown; findings below may reference images not displayed]

FINDINGS: Right testicle

Measurements: 4.7 x 2.5 x 3.4 cm. Focal area of heterogeneous
predominately hypoechoic echogenicity consist with tubular ectasia
of the rete testis. Small RIGHT testicular cyst identified 5 x 5 x 6
mm, containing a few scattered internal echoes. No additional mass
lesion or calcification. Internal blood flow present on color
Doppler imaging.

Left testicle

Measurements: 4.3 x 2.1 x 2.8 cm. Normal morphology without mass or
calcification. Internal blood flow present on color Doppler imaging.

Right epididymis: Multiple cysts throughout RIGHT epididymis largest
4.9 x 3.0 x 3.6 cm at head. Observed cysts contain a few scattered
low level internal echoes.

Left epididymis: Several LEFT epididymal cysts are identified
largest at body 6 x 8 x 10 mm.

Hydrocele: Large LEFT hydrocele containing minimal internal debris.
Minimal RIGHT hydrocele fluid.

Varicocele:  None visualized.

Pulsed Doppler interrogation of both testes demonstrates normal low
resistance arterial and venous waveforms bilaterally.

Incidentally noted scrotal skin thickening.
IMPRESSION: Large LEFT hydrocele.

Multiple large cysts in RIGHT epididymis either representing
spermatoceles or epididymal cysts.

Tubular ectasia of the RIGHT rete testis and small cyst within RIGHT
testis.

Few small cysts within LEFT epididymis.

## 2018-02-28 ENCOUNTER — Encounter: Payer: Self-pay | Admitting: Emergency Medicine

## 2018-02-28 ENCOUNTER — Inpatient Hospital Stay
Admission: EM | Admit: 2018-02-28 | Discharge: 2018-03-04 | DRG: 291 | Disposition: A | Discharge status: Home Health | Payer: Medicare Other | Attending: Internal Medicine | Admitting: Internal Medicine

## 2018-02-28 ENCOUNTER — Other Ambulatory Visit: Payer: Self-pay

## 2018-02-28 ENCOUNTER — Emergency Department: Payer: Medicare Other

## 2018-02-28 DIAGNOSIS — Z888 Allergy status to other drugs, medicaments and biological substances status: Secondary | ICD-10-CM

## 2018-02-28 DIAGNOSIS — N179 Acute kidney failure, unspecified: Secondary | ICD-10-CM

## 2018-02-28 DIAGNOSIS — R0603 Acute respiratory distress: Secondary | ICD-10-CM | POA: Diagnosis present

## 2018-02-28 DIAGNOSIS — I422 Other hypertrophic cardiomyopathy: Secondary | ICD-10-CM | POA: Diagnosis present

## 2018-02-28 DIAGNOSIS — Z7982 Long term (current) use of aspirin: Secondary | ICD-10-CM | POA: Diagnosis not present

## 2018-02-28 DIAGNOSIS — Z9049 Acquired absence of other specified parts of digestive tract: Secondary | ICD-10-CM

## 2018-02-28 DIAGNOSIS — T501X5A Adverse effect of loop [high-ceiling] diuretics, initial encounter: Secondary | ICD-10-CM | POA: Diagnosis present

## 2018-02-28 DIAGNOSIS — B961 Klebsiella pneumoniae [K. pneumoniae] as the cause of diseases classified elsewhere: Secondary | ICD-10-CM | POA: Diagnosis present

## 2018-02-28 DIAGNOSIS — N170 Acute kidney failure with tubular necrosis: Secondary | ICD-10-CM | POA: Diagnosis not present

## 2018-02-28 DIAGNOSIS — E876 Hypokalemia: Secondary | ICD-10-CM | POA: Diagnosis present

## 2018-02-28 DIAGNOSIS — N3000 Acute cystitis without hematuria: Secondary | ICD-10-CM | POA: Diagnosis present

## 2018-02-28 DIAGNOSIS — I872 Venous insufficiency (chronic) (peripheral): Secondary | ICD-10-CM | POA: Diagnosis present

## 2018-02-28 DIAGNOSIS — I482 Chronic atrial fibrillation: Secondary | ICD-10-CM | POA: Diagnosis present

## 2018-02-28 DIAGNOSIS — Z7952 Long term (current) use of systemic steroids: Secondary | ICD-10-CM

## 2018-02-28 DIAGNOSIS — M109 Gout, unspecified: Secondary | ICD-10-CM | POA: Diagnosis present

## 2018-02-28 DIAGNOSIS — I5033 Acute on chronic diastolic (congestive) heart failure: Secondary | ICD-10-CM | POA: Diagnosis present

## 2018-02-28 DIAGNOSIS — D696 Thrombocytopenia, unspecified: Secondary | ICD-10-CM | POA: Diagnosis present

## 2018-02-28 DIAGNOSIS — J810 Acute pulmonary edema: Secondary | ICD-10-CM | POA: Diagnosis not present

## 2018-02-28 DIAGNOSIS — N183 Chronic kidney disease, stage 3 (moderate): Secondary | ICD-10-CM | POA: Diagnosis present

## 2018-02-28 DIAGNOSIS — I509 Heart failure, unspecified: Secondary | ICD-10-CM

## 2018-02-28 DIAGNOSIS — I130 Hypertensive heart and chronic kidney disease with heart failure and stage 1 through stage 4 chronic kidney disease, or unspecified chronic kidney disease: Principal | ICD-10-CM | POA: Diagnosis present

## 2018-02-28 DIAGNOSIS — Z7189 Other specified counseling: Secondary | ICD-10-CM | POA: Diagnosis not present

## 2018-02-28 DIAGNOSIS — I4891 Unspecified atrial fibrillation: Secondary | ICD-10-CM | POA: Diagnosis not present

## 2018-02-28 DIAGNOSIS — R609 Edema, unspecified: Secondary | ICD-10-CM | POA: Diagnosis not present

## 2018-02-28 DIAGNOSIS — I5030 Unspecified diastolic (congestive) heart failure: Secondary | ICD-10-CM | POA: Diagnosis not present

## 2018-02-28 HISTORY — DX: Chronic diastolic (congestive) heart failure: I50.32

## 2018-02-28 HISTORY — DX: Chronic atrial fibrillation, unspecified: I48.20

## 2018-02-28 LAB — CBC
HCT: 51 % (ref 40.0–52.0)
Hemoglobin: 17 g/dL (ref 13.0–18.0)
MCH: 29.1 pg (ref 26.0–34.0)
MCHC: 33.2 g/dL (ref 32.0–36.0)
MCV: 87.7 fL (ref 80.0–100.0)
Platelets: 149 10*3/uL — ABNORMAL LOW (ref 150–440)
RBC: 5.82 MIL/uL (ref 4.40–5.90)
RDW: 16.8 % — ABNORMAL HIGH (ref 11.5–14.5)
WBC: 5.7 10*3/uL (ref 3.8–10.6)

## 2018-02-28 LAB — BASIC METABOLIC PANEL
Anion gap: 10 (ref 5–15)
BUN: 20 mg/dL (ref 6–20)
CO2: 23 mmol/L (ref 22–32)
Calcium: 9.1 mg/dL (ref 8.9–10.3)
Chloride: 104 mmol/L (ref 101–111)
Creatinine, Ser: 1.63 mg/dL — ABNORMAL HIGH (ref 0.61–1.24)
GFR calc Af Amer: 45 mL/min — ABNORMAL LOW (ref >60)
GFR calc non Af Amer: 39 mL/min — ABNORMAL LOW (ref >60)
Glucose, Bld: 130 mg/dL — ABNORMAL HIGH (ref 65–99)
Potassium: 3.8 mmol/L (ref 3.5–5.1)
Sodium: 137 mmol/L (ref 135–145)

## 2018-02-28 LAB — TROPONIN I: Troponin I: 0.04 ng/mL (ref <0.03)

## 2018-02-28 IMAGING — CR DG CHEST 2V
1 series · 2 of 2 positions shown · non-contrast
Comparison: Chest x-ray dated [DATE].

CLINICAL DATA: Shortness of breath.

EXAM:
CHEST - 2 VIEW

[Series 1: dg chest 2 view · 0.14mm/px · 2 of 2 slices shown]
[im 1/2]
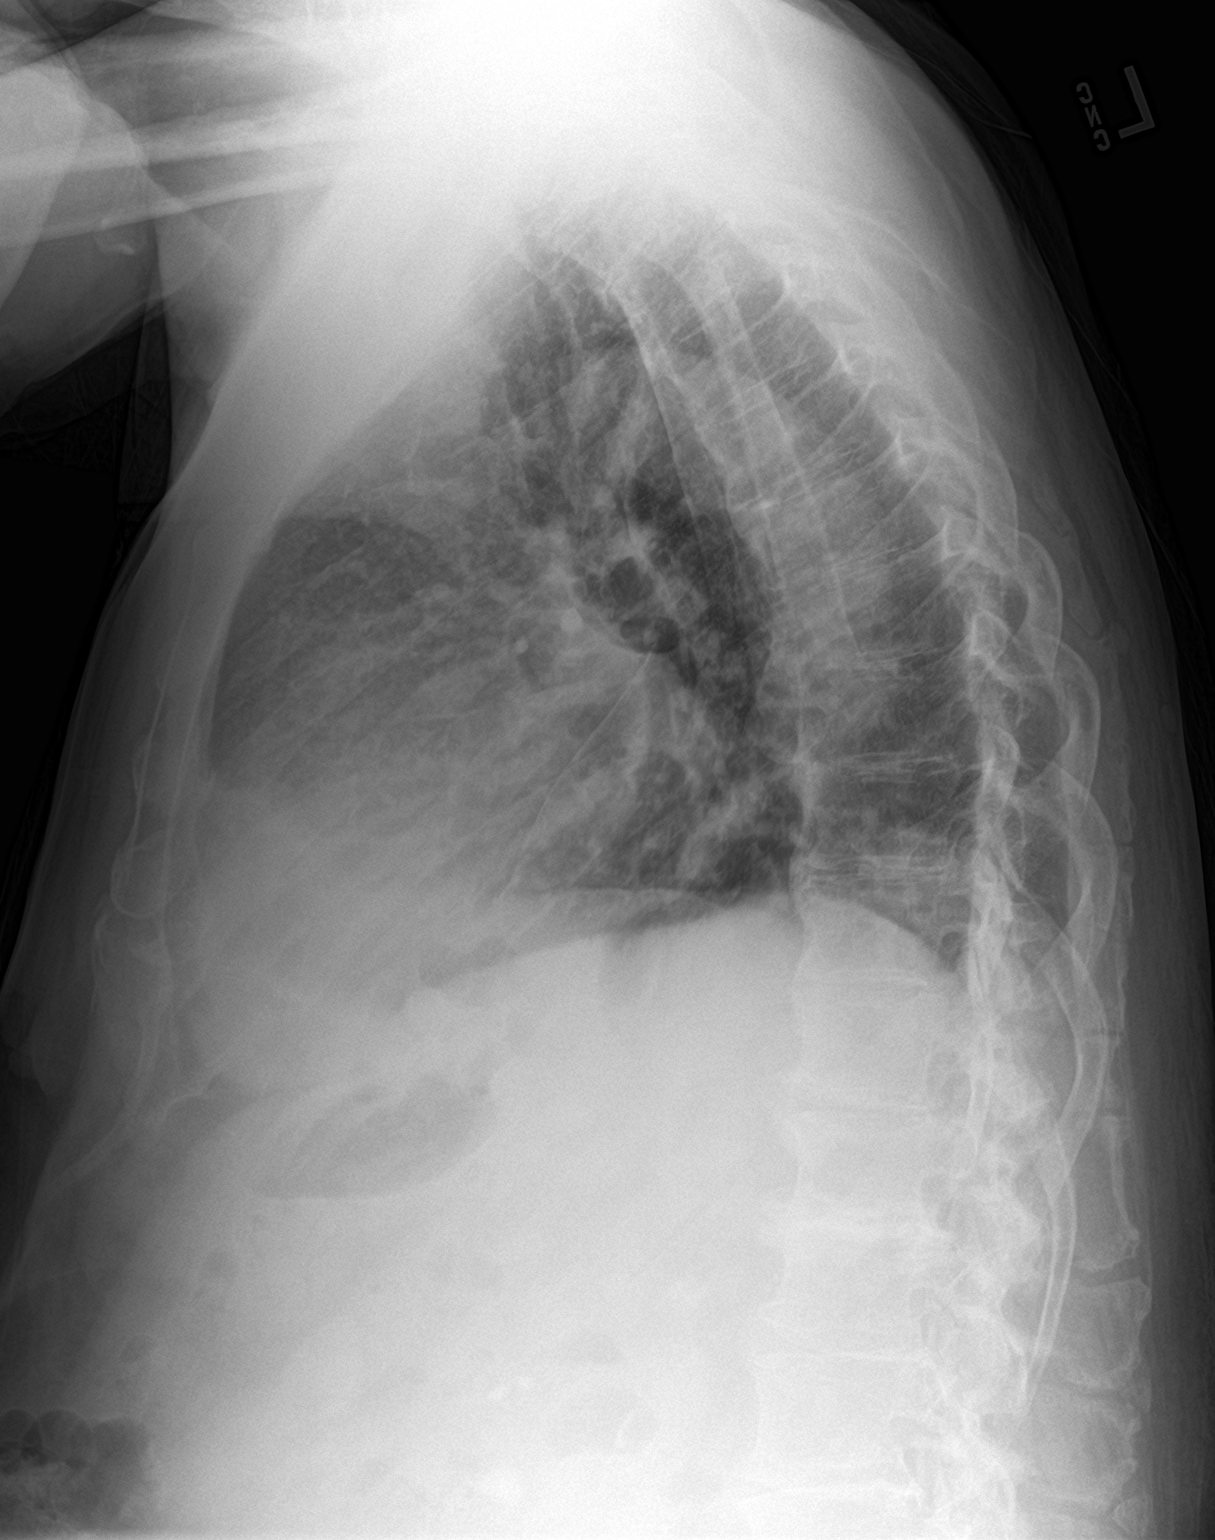
[im 2/2]
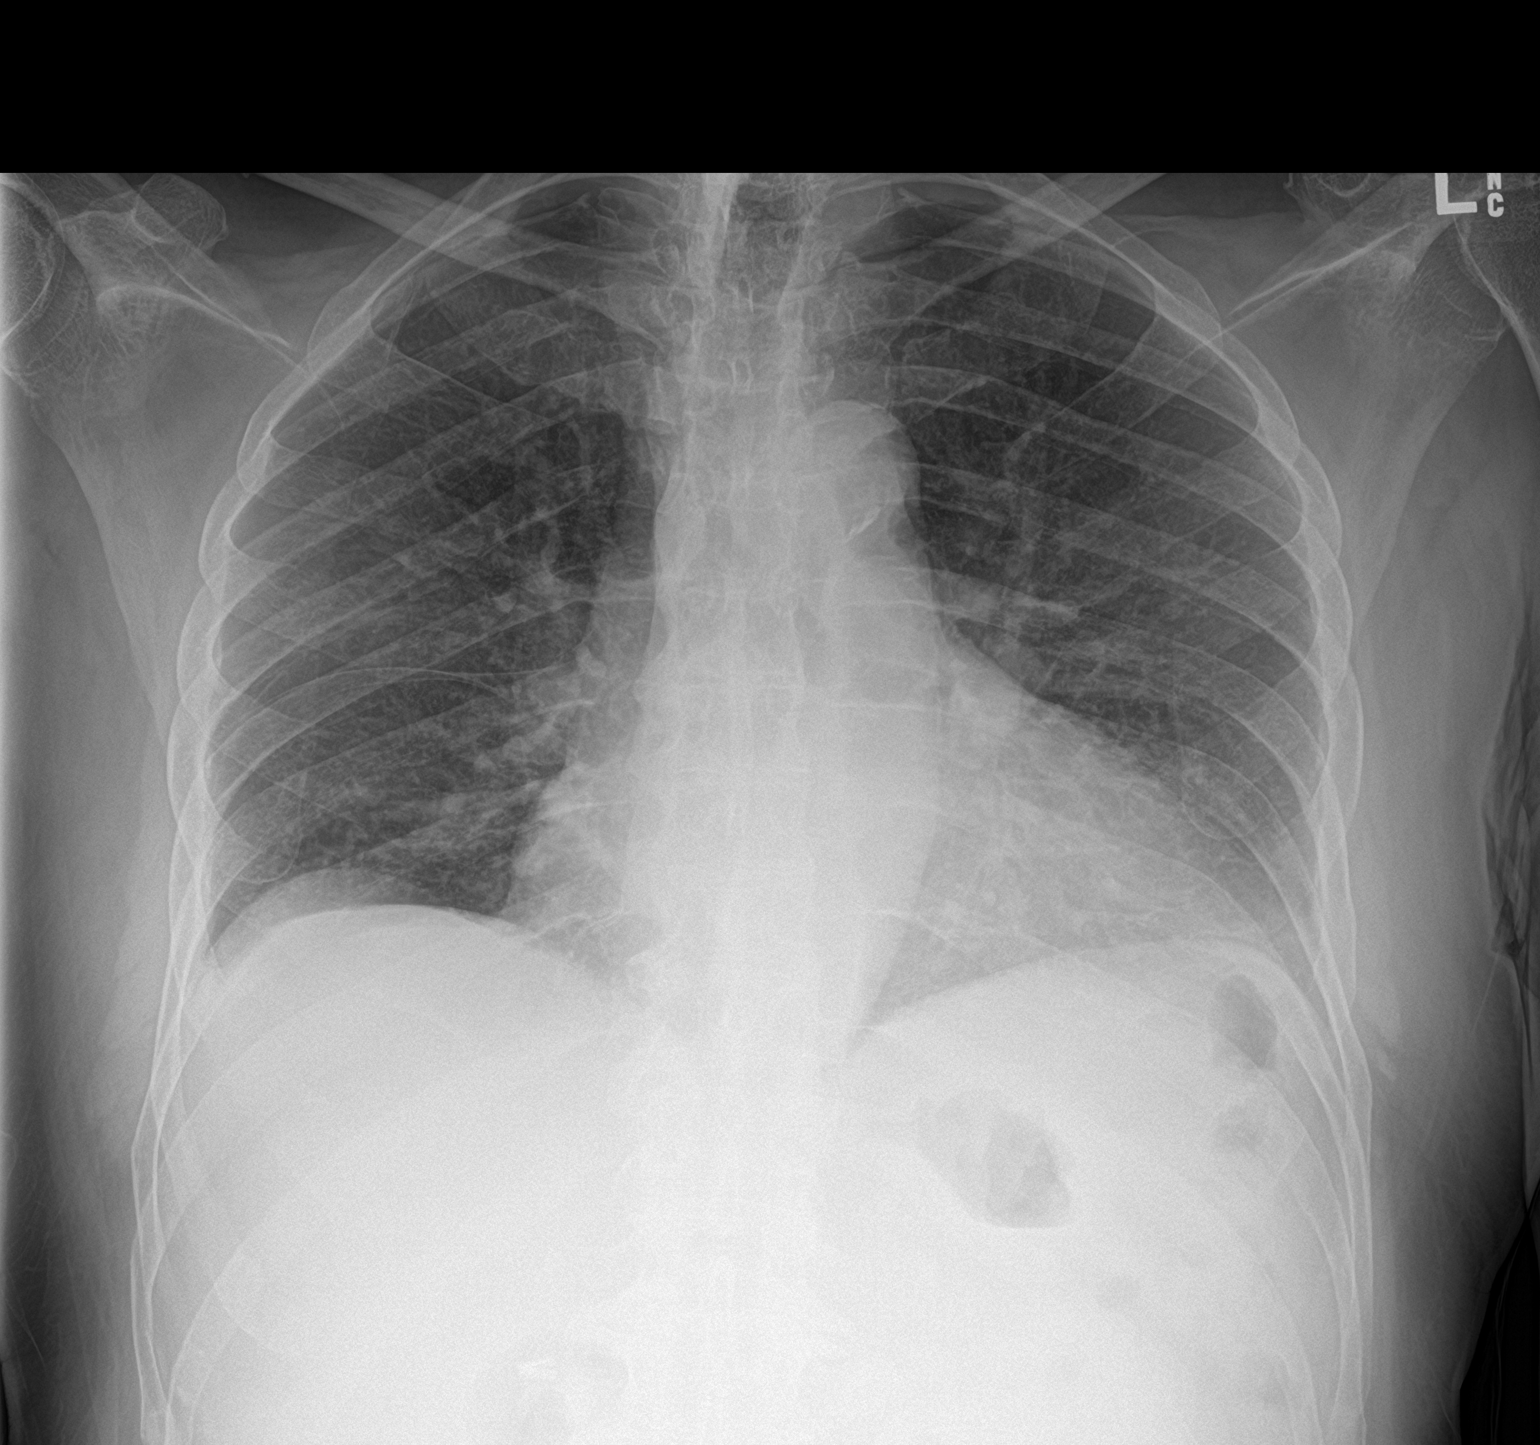

[2 of 2 positions shown; findings below may reference images not displayed]

FINDINGS: The heart size and mediastinal contours are within normal limits.
Mild pulmonary vascular congestion. No focal consolidation, pleural
effusion, or pneumothorax. No acute osseous abnormality.
IMPRESSION: Mild pulmonary vascular congestion.

## 2018-02-28 MED ORDER — POLYETHYLENE GLYCOL 3350 17 G PO PACK
17.0000 g | PACK | Freq: Every day | ORAL | Status: DC | PRN
  Administered 2018-03-04: 17 g via ORAL
  Filled 2018-02-28: qty 1

## 2018-02-28 MED ORDER — ONDANSETRON HCL 4 MG/2ML IJ SOLN: 4.0000 mg | Freq: Four times a day (QID) | INTRAMUSCULAR | Status: DC | PRN

## 2018-02-28 MED ORDER — HYDRALAZINE HCL 50 MG PO TABS
50.0000 mg | ORAL_TABLET | Freq: Three times a day (TID) | ORAL | Status: DC
  Administered 2018-03-01 (×2): 50 mg via ORAL
  Filled 2018-02-28 (×3): qty 1

## 2018-02-28 MED ORDER — ACETAMINOPHEN 650 MG RE SUPP: 650.0000 mg | Freq: Four times a day (QID) | RECTAL | Status: DC | PRN

## 2018-02-28 MED ORDER — DILTIAZEM HCL 25 MG/5ML IV SOLN
10.0000 mg | Freq: Once | INTRAVENOUS | Status: AC
  Administered 2018-02-28: 10 mg via INTRAVENOUS
  Filled 2018-02-28: qty 5

## 2018-02-28 MED ORDER — ACETAMINOPHEN 325 MG PO TABS
650.0000 mg | ORAL_TABLET | Freq: Four times a day (QID) | ORAL | Status: DC | PRN
  Administered 2018-03-02 – 2018-03-04 (×5): 650 mg via ORAL
  Filled 2018-02-28 (×6): qty 2

## 2018-02-28 MED ORDER — FUROSEMIDE 10 MG/ML IJ SOLN: 60.0000 mg | Freq: Two times a day (BID) | INTRAMUSCULAR | Status: DC

## 2018-02-28 MED ORDER — ASPIRIN 325 MG PO TABS
325.0000 mg | ORAL_TABLET | Freq: Every day | ORAL | Status: DC
  Administered 2018-03-01 – 2018-03-03 (×3): 325 mg via ORAL
  Filled 2018-02-28 (×4): qty 1

## 2018-02-28 MED ORDER — ONDANSETRON HCL 4 MG PO TABS: 4.0000 mg | ORAL_TABLET | Freq: Four times a day (QID) | ORAL | Status: DC | PRN

## 2018-02-28 MED ORDER — ALBUTEROL SULFATE (2.5 MG/3ML) 0.083% IN NEBU: 2.5000 mg | INHALATION_SOLUTION | RESPIRATORY_TRACT | Status: DC | PRN

## 2018-02-28 MED ORDER — ENOXAPARIN SODIUM 40 MG/0.4ML ~~LOC~~ SOLN
30.0000 mg | SUBCUTANEOUS | Status: DC
  Administered 2018-02-28: 30 mg via SUBCUTANEOUS
  Filled 2018-02-28: qty 0.4

## 2018-02-28 MED ORDER — FUROSEMIDE 10 MG/ML IJ SOLN
60.0000 mg | Freq: Once | INTRAMUSCULAR | Status: AC
  Administered 2018-02-28: 60 mg via INTRAVENOUS
  Filled 2018-02-28: qty 8

## 2018-02-28 MED ORDER — ATORVASTATIN CALCIUM 20 MG PO TABS
40.0000 mg | ORAL_TABLET | Freq: Every day | ORAL | Status: DC
  Administered 2018-03-01 – 2018-03-03 (×3): 40 mg via ORAL
  Filled 2018-02-28 (×3): qty 2

## 2018-02-28 MED ORDER — METOPROLOL TARTRATE 25 MG PO TABS
25.0000 mg | ORAL_TABLET | Freq: Two times a day (BID) | ORAL | Status: DC
  Administered 2018-02-28 – 2018-03-03 (×6): 25 mg via ORAL
  Filled 2018-02-28 (×8): qty 1

## 2018-02-28 NOTE — ED Triage Notes (Signed)
Says was sent from [facility] for swelling in leg--left more than right.  Per patient this has been going on for years.  He had gone to [facility] for feeling dehydrated and gout in hands.  He does admit to having shortness of breath last night.

## 2018-02-28 NOTE — ED Notes (Signed)
Condom cath applied

## 2018-02-28 NOTE — ED Notes (Signed)
BiPAP turned off to see how pt is tolerating room air.

## 2018-02-28 NOTE — H&P (Signed)
[facility] at [place] NAME: George Bolton    MR#:  [id number]  DATE OF BIRTH:  February 12, 1941  DATE OF ADMISSION:  02/28/2018  PRIMARY CARE PHYSICIAN: Denton Lank, MD   REQUESTING/REFERRING PHYSICIAN: Dr. Clearnce Hasten  CHIEF COMPLAINT:   Chief Complaint  Patient presents with  . Shortness of Breath  . Leg Swelling    HISTORY OF PRESENT ILLNESS:  George Bolton  is a 77 y.o. male with a known history of hypertension, CKD stage III, chronic diastolic congestive heart failure, atrial fibrillation presents to the hospital sent in from [facility] urgent care due to shortness of breath and lower extremity swelling. Patient was found to be extremely tachypneic breathing in 30s although his saturations were normal.  Was given dose of IV Lasix and started on BiPAP.  Patient improved slowly during the emergency room stay.  Now he is off BiPAP saturating 95% on room air.  Continues to have tachypnea and is being admitted for CHF. Patient has chronic left lower extremity edema more than right  PAST MEDICAL HISTORY:   Past Medical History:  Diagnosis Date  . A-fib ([place])   . CHF (congestive heart failure) ([place])   . Gout   . Hypertension   . Irregular heart beat     PAST SURGICAL HISTORY:   Past Surgical History:  Procedure Laterality Date  . CHOLECYSTECTOMY N/A 01/13/2017   Procedure: LAPAROSCOPIC CHOLECYSTECTOMY;  Surgeon: Jules Husbands, MD;  Location: [facility] ORS;  Service: General;  Laterality: N/A;    SOCIAL HISTORY:   Social History   Tobacco Use  . Smoking status: Never Smoker  . Smokeless tobacco: Never Used  Substance Use Topics  . Alcohol use: No    FAMILY HISTORY:  No family history on file.  DRUG ALLERGIES:   Allergies  Allergen Reactions  . Cortisone     REVIEW OF SYSTEMS:   Review of Systems  Constitutional: Positive for malaise/fatigue. Negative for chills and fever.  HENT: Negative for sore throat.   Eyes:  Negative for blurred vision, double vision and pain.  Respiratory: Positive for cough and shortness of breath. Negative for hemoptysis and wheezing.   Cardiovascular: Positive for orthopnea and leg swelling. Negative for chest pain and palpitations.  Gastrointestinal: Negative for abdominal pain, constipation, diarrhea, heartburn, nausea and vomiting.  Genitourinary: Negative for dysuria and hematuria.  Musculoskeletal: Negative for back pain and joint pain.  Skin: Negative for rash.  Neurological: Negative for sensory change, speech change, focal weakness and headaches.  Endo/Heme/Allergies: Does not bruise/bleed easily.  Psychiatric/Behavioral: Negative for depression. The patient is not nervous/anxious.     MEDICATIONS AT HOME:   Prior to Admission medications   Medication Sig Start Date End Date Taking? Authorizing Provider  aspirin 325 MG tablet Take 325 mg by mouth daily.   Yes [provider]  atorvastatin (LIPITOR) 40 MG tablet Take 1 tablet by mouth daily.   Yes [provider]  felodipine (PLENDIL) 10 MG 24 hr tablet Take 1 tablet by mouth daily.   Yes [provider]  furosemide (LASIX) 40 MG tablet Take 20 mg by mouth daily as needed for fluid or edema.   Yes [provider]  hydrALAZINE (APRESOLINE) 50 MG tablet Take 1 tablet by mouth 3 (three) times daily. 12/26/16  Yes [provider]     VITAL SIGNS:  Blood pressure 118/69, pulse 79, temperature 99.5 F (37.5 C), temperature source Oral, resp. rate (!) 23, height  6' (1.829 m), weight 90.7 kg (200 lb), SpO2 100 %.  PHYSICAL EXAMINATION:  Physical Exam  GENERAL:  76 y.o.-year-old patient lying in the bed with no acute distress.  EYES: Pupils equal, round, reactive to light and accommodation. No scleral icterus. Extraocular muscles intact.  HEENT: Head atraumatic, normocephalic. Oropharynx and nasopharynx clear. No oropharyngeal erythema, moist oral mucosa  NECK:  Supple, no  jugular venous distention. No thyroid enlargement, no tenderness.  LUNGS: Increased work of breathing.  Decreased air entry at the bases. CARDIOVASCULAR: S1, S2 normal. No murmurs, rubs, or gallops.  ABDOMEN: Soft, nontender, nondistended. Bowel sounds present. No organomegaly or mass.  EXTREMITIES: No  cyanosis, or clubbing. + 2 pedal & radial pulses b/l.  Left lower extremity edema NEUROLOGIC: Cranial nerves II through XII are intact. No focal Motor or sensory deficits appreciated b/l PSYCHIATRIC: The patient is alert and oriented x 3. Good affect.  SKIN: No obvious rash, lesion, or ulcer.   LABORATORY PANEL:   CBC Recent Labs  Lab 02/28/18 1444  WBC 5.7  HGB 17.0  HCT 51.0  PLT 149*   ------------------------------------------------------------------------------------------------------------------  Chemistries  Recent Labs  Lab 02/28/18 1338  NA 137  K 3.8  CL 104  CO2 23  GLUCOSE 130*  BUN 20  CREATININE 1.63*  CALCIUM 9.1   ------------------------------------------------------------------------------------------------------------------  Cardiac Enzymes Recent Labs  Lab 02/28/18 1338  TROPONINI 0.04*   ------------------------------------------------------------------------------------------------------------------  RADIOLOGY:  Dg Chest 2 View  Result Date: 02/28/2018 CLINICAL DATA:  Shortness of breath. EXAM: CHEST - 2 VIEW COMPARISON:  Chest x-ray dated October 21, 2013. FINDINGS: The heart size and mediastinal contours are within normal limits. Mild pulmonary vascular congestion. No focal consolidation, pleural effusion, or pneumothorax. No acute osseous abnormality. IMPRESSION: Mild pulmonary vascular congestion. Electronically Signed   By: Titus Dubin M.D.   On: 02/28/2018 14:05     IMPRESSION AND PLAN:   *Acute on chronic diastolic congestive heart failure - IV Lasix, Beta blockers - Input and Output - Counseled to limit fluids and Salt -  Monitor Bun/Cr and Potassium - Echo - reviewed -Cardiology follow up after discharge  *Atrial fibrillation with rapid ventricular rate.  Patient received 1 dose of IV Cardizem.  We will start him on low-dose metoprolol.  Aspirin at home will be continued.  *CKD stage III.  Monitor creatinine while being diuresed.  *Hypertension.  Will hold felodipine as patient is being started on metoprolol.  Continue hydralazine.  DVT prophylaxis with renally dose Lovenox   All the records are reviewed and case discussed with ED provider. Management plans discussed with the patient, family and they are in agreement.  CODE STATUS: FULL CODE  TOTAL TIME TAKING CARE OF THIS PATIENT: 40 minutes.   Leia Alf Braelyn Jenson M.D on 02/28/2018 at 5:15 PM  Between 7am to 6pm - Pager - [phone]  After 6pm go to [email or web address] - password [email or web address] [facility]  Office  [phone]  CC: Primary care physician; Denton Lank, MD  Note: This dictation was prepared with Dragon dictation along with smaller phrase technology. Any transcriptional errors that result from this process are unintentional.

## 2018-02-28 NOTE — ED Provider Notes (Addendum)
[facility] Emergency Department Provider Note  ____________________________________________   First MD Initiated Contact with Patient 02/28/18 1456     (approximate)  I have reviewed the triage vital signs and the nursing notes.   HISTORY  Chief Complaint Shortness of Breath and Leg Swelling   HPI George Bolton is a 77 y.o. male history of atrial fibrillation not on any anticoagulation who is presenting to the emergency department shortness of breath.  Patient also with a history of CHF but is noncompliant with his Lasix.  He is supposed be on 40 mg daily but "only takes it when he thinks he needs it" per his wife.  He is denying any chest pain.  Says that his shortness of breath worsened last night and this is what is bring him into the emergency department today.  He is not normally on oxygen, supplementally.  Oxygen saturation of 99% at the [facility].  However, was placed on supplemental O2.  Patient denies any pain.  Says that the increased swelling in his left lower extremity is baseline.  Ultrasound from January 12, 2017 for left lower externally edema negative for bilateral DVTs.  Past Medical History:  Diagnosis Date  . A-fib ([place])   . CHF (congestive heart failure) ([place])   . Gout   . Hypertension   . Irregular heart beat     Patient Active Problem List   Diagnosis Date Noted  . Gout 01/25/2017  . Acute cholecystitis 01/11/2017  . Cholecystitis   . CHF (congestive heart failure) ([place]) 04/25/2015  . Hypertension 04/25/2015    Past Surgical History:  Procedure Laterality Date  . CHOLECYSTECTOMY N/A 01/13/2017   Procedure: LAPAROSCOPIC CHOLECYSTECTOMY;  Surgeon: Jules Husbands, MD;  Location: [facility] ORS;  Service: General;  Laterality: N/A;    Prior to Admission medications   Medication Sig Start Date End Date Taking? Authorizing Provider  aspirin 325 MG tablet Take 325 mg by mouth daily.    [provider]  atenolol (TENORMIN)  50 MG tablet Take 1 tablet by mouth daily. 01/09/17   [provider]  atorvastatin (LIPITOR) 40 MG tablet Take 1 tablet by mouth daily.    [provider]  felodipine (PLENDIL) 10 MG 24 hr tablet Take 1 tablet by mouth daily.    [provider]  furosemide (LASIX) 40 MG tablet Take 1 tablet by mouth as needed.     [provider]  hydrALAZINE (APRESOLINE) 50 MG tablet Take 1 tablet by mouth 3 (three) times daily. 12/26/16   [provider]  hydrochlorothiazide (HYDRODIURIL) 25 MG tablet Take 1 tablet by mouth 1 day or 1 dose.    [provider]  oxyCODONE-acetaminophen (PERCOCET/ROXICET) 5-325 MG tablet Take 1 tablet by mouth 4 (four) times daily as needed. 11/04/16   [provider]  predniSONE (DELTASONE) 20 MG tablet Take 1 tablet by mouth 1 day or 1 dose. 11/04/16   [provider]    Allergies Cortisone  No family history on file.  Social History Social History   Tobacco Use  . Smoking status: Never Smoker  . Smokeless tobacco: Never Used  Substance Use Topics  . Alcohol use: No  . Drug use: No    Review of Systems  Constitutional: No fever/chills Eyes: No visual changes. ENT: No sore throat. Cardiovascular: Denies chest pain. Respiratory: As above Gastrointestinal: No abdominal pain.  No nausea, no vomiting.  No diarrhea.  No constipation. Genitourinary: Negative for dysuria. Musculoskeletal: Negative  for back pain. Skin: Negative for rash. Neurological: Negative for headaches, focal weakness or numbness.   ____________________________________________   PHYSICAL EXAM:  VITAL SIGNS: ED Triage Vitals  Enc Vitals Group     BP 02/28/18 1313 (!) 148/89     Pulse Rate 02/28/18 1313 91     Resp 02/28/18 1313 20     Temp 02/28/18 1313 99.5 F (37.5 C)     Temp Source 02/28/18 1313 Oral     SpO2 02/28/18 1313 99 %     Weight 02/28/18 1313 200 lb (90.7 kg)     Height 02/28/18 1313 6' (1.829 m)      Head Circumference --      Peak Flow --      Pain Score 02/28/18 1316 7     Pain Loc --      Pain Edu? --      Excl. in [place]? --     Constitutional: Alert and oriented.  Patient with labored respirations.  Says that he feels like he is tiring. Eyes: Conjunctivae are normal.  Head: Atraumatic. Nose: No congestion/rhinnorhea.  Wearing nasal cannula O2.   Mouth/Throat: Mucous membranes are moist.  Neck: No stridor.   Cardiovascular: Tachycardic with an irregularly irregular rhythm.  Grossly normal heart sounds.  Good peripheral circulation with equal and bilateral dorsalis pedis pulses.  Heart rate between 105 and 120 in the room. Respiratory: Tachypneic with decreased lung sounds in the bilateral bases. Gastrointestinal: Soft and nontender. No distention.  Musculoskeletal: Moderate to severe left lower extremity edema.  Mild to moderate right lower extremity edema.  The patient's wife says this is chronic. Neurologic:  Normal speech and language. No gross focal neurologic deficits are appreciated. Skin:  Skin is warm, dry and intact. No rash noted. Psychiatric: Mood and affect are normal. Speech and behavior are normal.  ____________________________________________   LABS (all labs ordered are listed, but only abnormal results are displayed)  Labs Reviewed  BASIC METABOLIC PANEL - Abnormal; Notable for the following components:      Result Value   Glucose, Bld 130 (*)    Creatinine, Ser 1.63 (*)    GFR calc non Af Amer 39 (*)    GFR calc Af Amer 45 (*)    All other components within normal limits  TROPONIN I - Abnormal; Notable for the following components:   Troponin I 0.04 (*)    All other components within normal limits  CBC - Abnormal; Notable for the following components:   RDW 16.8 (*)    Platelets 149 (*)    All other components within normal limits   ____________________________________________  EKG  ED ECG REPORT I, Doran Stabler, the attending physician,  personally viewed and interpreted this ECG.   Date: 02/28/2018  EKG Time: 1503  Rate: 133  Rhythm: atrial fibrillation, rate 133.  Multiple PVCs.  Axis: Normal  Intervals:nonspecific intraventricular conduction delay  ST&T Change: T wave inversions in V4 through V6 with 1 to 2 mm depressions in V4 through V6.  No ST elevations.  ED ECG REPORT I, Doran Stabler, the attending physician, personally viewed and interpreted this ECG.   Date: 02/28/2018  EKG Time: 1323  Rate: 87  Rhythm: atrial fibrillation, rate 87  Axis: Normal  Intervals:none  ST&T Change: T wave inversions in V5 and V6 with minimal depression in V5 and V6.  Today's EKG is without significant change from previous. ____________________________________________  RADIOLOGY  Congestion seen on the bilateral fields to  the chest x-ray. ____________________________________________   PROCEDURES  Procedure(s) performed:   .Critical Care Performed by: Orbie Pyo, MD Authorized by: Orbie Pyo, MD   Critical care provider statement:    Critical care time (minutes):  35   Critical care was necessary to treat or prevent imminent or life-threatening deterioration of the following conditions:  Respiratory failure and cardiac failure   Critical care was time spent personally by me on the following activities:  Examination of patient, evaluation of patient's response to treatment, ordering and review of laboratory studies, ordering and review of radiographic studies, pulse oximetry and re-evaluation of patient's condition    Critical Care performed:    ____________________________________________   INITIAL IMPRESSION / [place] / ED COURSE  Pertinent labs & imaging results that were available during my care of the patient were reviewed by me and considered in my medical decision making (see chart for details).  Differential includes, but is not limited to, viral syndrome,  bronchitis including COPD exacerbation, pneumonia, reactive airway disease including asthma, CHF including exacerbation with or without pulmonary/interstitial edema, pneumothorax, ACS, thoracic trauma, and pulmonary embolism. As part of my medical decision making, I reviewed the following data within the [place] Notes from prior ED visits  Patient placed on BiPAP due to increased work of breathing.  We will also give him Lasix, 60 mg and reevaluate.  Work-up consistent with CHF.  Patient likely will be admitted.  ----------------------------------------- 4:31 PM on 02/28/2018 -----------------------------------------  Patient tolerating the BiPAP well.  88 bpm after Cardizem.  Patient will be admitted to the hospital.  He is understanding the plan willing to comply.  Signed out to Dr. Verdell Carmine. ____________________________________________   FINAL CLINICAL IMPRESSION(S) / ED DIAGNOSES  CHF.  Respiratory distress.    NEW MEDICATIONS STARTED DURING THIS VISIT:  New Prescriptions   No medications on file     Note:  This document was prepared using Dragon voice recognition software and may include unintentional dictation errors.     Orbie Pyo, MD 02/28/18 1631    Clearnce Hasten Randall An, MD 02/28/18 [phone]

## 2018-02-28 NOTE — ED Notes (Signed)
Critical result, troponin 0.04

## 2018-03-01 ENCOUNTER — Encounter: Payer: Self-pay | Admitting: Emergency Medicine

## 2018-03-01 ENCOUNTER — Inpatient Hospital Stay (HOSPITAL_COMMUNITY)
Admit: 2018-03-01 | Discharge: 2018-03-01 | Disposition: A | Discharge status: Home / Self Care | Payer: Medicare Other | Attending: Internal Medicine | Admitting: Internal Medicine

## 2018-03-01 ENCOUNTER — Inpatient Hospital Stay: Payer: Medicare Other

## 2018-03-01 DIAGNOSIS — I5030 Unspecified diastolic (congestive) heart failure: Secondary | ICD-10-CM

## 2018-03-01 LAB — URINALYSIS, COMPLETE (UACMP) WITH MICROSCOPIC
Bilirubin Urine: NEGATIVE
Glucose, UA: NEGATIVE mg/dL
Ketones, ur: NEGATIVE mg/dL
Nitrite: NEGATIVE
Protein, ur: 100 mg/dL — AB
Specific Gravity, Urine: 1.017 (ref 1.005–1.030)
pH: 5 (ref 5.0–8.0)

## 2018-03-01 LAB — BASIC METABOLIC PANEL
Anion gap: 9 (ref 5–15)
BUN: 28 mg/dL — ABNORMAL HIGH (ref 6–20)
CO2: 23 mmol/L (ref 22–32)
Calcium: 8.5 mg/dL — ABNORMAL LOW (ref 8.9–10.3)
Chloride: 107 mmol/L (ref 101–111)
Creatinine, Ser: 2.2 mg/dL — ABNORMAL HIGH (ref 0.61–1.24)
GFR calc Af Amer: 31 mL/min — ABNORMAL LOW (ref >60)
GFR calc non Af Amer: 27 mL/min — ABNORMAL LOW (ref >60)
Glucose, Bld: 160 mg/dL — ABNORMAL HIGH (ref 65–99)
Potassium: 3.3 mmol/L — ABNORMAL LOW (ref 3.5–5.1)
Sodium: 139 mmol/L (ref 135–145)

## 2018-03-01 LAB — DIFFERENTIAL
Basophils Absolute: 0 10*3/uL (ref 0–0.1)
Basophils Relative: 0 %
Eosinophils Absolute: 0 10*3/uL (ref 0–0.7)
Eosinophils Relative: 0 %
Lymphocytes Relative: 6 %
Lymphs Abs: 1 10*3/uL (ref 1.0–3.6)
Monocytes Absolute: 0.7 10*3/uL (ref 0.2–1.0)
Monocytes Relative: 5 %
Neutro Abs: 13.7 10*3/uL — ABNORMAL HIGH (ref 1.4–6.5)
Neutrophils Relative %: 89 %

## 2018-03-01 LAB — CBC
HCT: 42.7 % (ref 40.0–52.0)
Hemoglobin: 14.4 g/dL (ref 13.0–18.0)
MCH: 28.7 pg (ref 26.0–34.0)
MCHC: 33.7 g/dL (ref 32.0–36.0)
MCV: 85.3 fL (ref 80.0–100.0)
Platelets: 112 10*3/uL — ABNORMAL LOW (ref 150–440)
RBC: 5.01 MIL/uL (ref 4.40–5.90)
RDW: 16.3 % — ABNORMAL HIGH (ref 11.5–14.5)
WBC: 15.1 10*3/uL — ABNORMAL HIGH (ref 3.8–10.6)

## 2018-03-01 LAB — ECHOCARDIOGRAM COMPLETE
Height: 72 in
Weight: 3096 oz

## 2018-03-01 MED ORDER — POTASSIUM CHLORIDE CRYS ER 20 MEQ PO TBCR
40.0000 meq | EXTENDED_RELEASE_TABLET | ORAL | Status: AC
  Administered 2018-03-01 (×2): 40 meq via ORAL
  Filled 2018-03-01 (×2): qty 2

## 2018-03-01 NOTE — Progress Notes (Signed)
PT Cancellation Note  Patient Details Name: George Bolton MRN: [id number] DOB: Apr 03, 1941   Cancelled Treatment:    Reason Eval/Treat Not Completed: Patient not medically ready.  PT consult received.  Chart reviewed.  Pt currently pending B LE [place] to r/o DVT.  Will hold PT at this time (and re-attempt PT at a later date/time) until results of imaging are known and pt is medically appropriate to participate in PT.  Leitha Bleak, PT 03/01/18, 1:26 PM [phone]

## 2018-03-01 NOTE — Progress Notes (Signed)
*  PRELIMINARY RESULTS* Echocardiogram 2D Echocardiogram has been performed.  George Bolton 03/01/2018, 2:56 PM

## 2018-03-01 NOTE — Progress Notes (Signed)
[facility] at [place] NAME: George Bolton    MR#:  [id number]  DATE OF BIRTH:  1941/03/26  SUBJECTIVE:  CHIEF COMPLAINT:   Chief Complaint  Patient presents with  . Shortness of Breath  . Leg Swelling   -Denies increased lower extremity swelling, left greater than right. -Breathing is improving  REVIEW OF SYSTEMS:  Review of Systems  Constitutional: Negative for chills, fever and malaise/fatigue.  HENT: Negative for congestion, ear discharge, hearing loss and nosebleeds.   Eyes: Negative for blurred vision and double vision.  Respiratory: Positive for shortness of breath. Negative for cough and wheezing.   Cardiovascular: Positive for leg swelling. Negative for chest pain and palpitations.  Gastrointestinal: Negative for abdominal pain, constipation, diarrhea, nausea and vomiting.  Genitourinary: Negative for dysuria.  Musculoskeletal: Negative for myalgias.  Neurological: Negative for dizziness, focal weakness, seizures, weakness and headaches.  Psychiatric/Behavioral: Negative for depression.    DRUG ALLERGIES:   Allergies  Allergen Reactions  . Cortisone     VITALS:  Blood pressure 102/68, pulse 81, temperature 99.3 F (37.4 C), temperature source Oral, resp. rate 18, height 6' (1.829 m), weight 87.8 kg (193 lb 8 oz), SpO2 96 %.  PHYSICAL EXAMINATION:  Physical Exam  GENERAL:  77 y.o.-year-old patient lying in the bed with no acute distress.  EYES: Pupils equal, round, reactive to light and accommodation. No scleral icterus. Extraocular muscles intact.  HEENT: Head atraumatic, normocephalic. Oropharynx and nasopharynx clear.  NECK:  Supple, no jugular venous distention. No thyroid enlargement, no tenderness.  LUNGS: Normal breath sounds bilaterally, no wheezing, rales,rhonchi or crepitation. No use of accessory muscles of respiration.  Decreased bibasilar breath sounds noted CARDIOVASCULAR: S1, S2 normal. No murmurs,  rubs, or gallops.  ABDOMEN: Soft, nontender, nondistended. Bowel sounds present. No organomegaly or mass.  EXTREMITIES: No cyanosis, or clubbing.  Left leg has 2+ edema compared to the right leg NEUROLOGIC: Cranial nerves II through XII are intact. Muscle strength 5/5 in all extremities. Sensation intact. Gait not checked.  PSYCHIATRIC: The patient is alert and oriented x 3.  SKIN: No obvious rash, lesion, or ulcer.    LABORATORY PANEL:   CBC Recent Labs  Lab 03/01/18 0327  WBC 15.1*  HGB 14.4  HCT 42.7  PLT 112*   ------------------------------------------------------------------------------------------------------------------  Chemistries  Recent Labs  Lab 03/01/18 0327  NA 139  K 3.3*  CL 107  CO2 23  GLUCOSE 160*  BUN 28*  CREATININE 2.20*  CALCIUM 8.5*   ------------------------------------------------------------------------------------------------------------------  Cardiac Enzymes Recent Labs  Lab 02/28/18 1338  TROPONINI 0.04*   ------------------------------------------------------------------------------------------------------------------  RADIOLOGY:  Dg Chest 2 View  Result Date: 02/28/2018 CLINICAL DATA:  Shortness of breath. EXAM: CHEST - 2 VIEW COMPARISON:  Chest x-ray dated October 21, 2013. FINDINGS: The heart size and mediastinal contours are within normal limits. Mild pulmonary vascular congestion. No focal consolidation, pleural effusion, or pneumothorax. No acute osseous abnormality. IMPRESSION: Mild pulmonary vascular congestion. Electronically Signed   By: Titus Dubin M.D.   On: 02/28/2018 14:05    EKG:   Orders placed or performed during the hospital encounter of 02/28/18  . ED EKG within 10 minutes  . ED EKG within 10 minutes  . EKG 12-Lead  . EKG 12-Lead  . EKG 12-Lead  . EKG 12-Lead    ASSESSMENT AND PLAN:   77 year old male with past medical history significant for CHF, CKD stage III, hypertension and A. fib presents to  hospital secondary  to worsening shortness of breath and lower extremity swelling.  1.  Acute on chronic diastolic CHF exacerbation-admitted, IV Lasix was given. -This morning his BUN and creatinine are elevated.  We will hold off on Lasix today. -Continue fluid restriction, low-sodium diet -Saturations are well on room air.  Off supplemental oxygen. -Doppler of the lower extremity ordered to make sure there is no DVT.  Last ultrasound was done last year which was negative  2.  Acute renal failure on CKD stage III-Baseline creatinine seems to be between 1.7 and 1.9 -Likely secondary to Lasix.  Monitor for now while Lasix is on hold.  3.  Hypertension-continue home medications.  Patient on metoprolol and hydralazine  4.  Hypokalemia-being replaced  5.  DVT prophylaxis-Lovenox  6.  Leukocytosis-chronic leukocytosis in the past.  Check urine analysis    All the records are reviewed and case discussed with Care Management/Social Workerr. Management plans discussed with the patient, family and they are in agreement.  CODE STATUS: Full code  TOTAL TIME TAKING CARE OF THIS PATIENT: 38 minutes.   POSSIBLE D/C IN 1-2 to DAYS, DEPENDING ON CLINICAL CONDITION.   Gladstone Lighter M.D on 03/01/2018 at 11:51 AM  Between 7am to 6pm - Pager - [phone]  After 6pm go to [email or web address] - password [email or web address] [facility]  Office  [phone]  CC: Primary care physician; Denton Lank, MD

## 2018-03-01 NOTE — Evaluation (Signed)
Physical Therapy Evaluation Patient Details Name: George Bolton MRN: [id number] DOB: 1940/12/06 Today's Date: 03/01/2018   History of Present Illness  77 yo male with admission after developing SOB and LE edema, now with acute renal failure, CHF acute on chronic, cleared for DVT's with PMHx:  leukocytosis, HTN, low K+, CHF, bipolar, cannabis use, a-fib, gout, irregular heart beat  Clinical Impression  Pt was seen for evaluation of mobility iincluding how this would affect his significant other who is caregiver.  Pt is tired and not demonstrating any use of hands.    Follow Up Recommendations SNF    Equipment Recommendations  Rolling walker with 5" wheels    Recommendations for Other Services       Precautions / Restrictions Precautions Precautions: Fall(telemetry) Restrictions Weight Bearing Restrictions: No      Mobility  Bed Mobility Overal bed mobility: Needs Assistance Bed Mobility: Supine to Sit;Sit to Supine     Supine to sit: Mod assist Sit to supine: Mod assist   General bed mobility comments: mod assist to get to bed and assist LE's to reposition on the bed  Transfers Overall transfer level: Needs assistance Equipment used: Rolling walker (2 wheeled);1 person hand held assist Transfers: Sit to/from Stand Sit to Stand: Min assist;Mod assist         General transfer comment: mod to power up and min to control initial standing  Ambulation/Gait Ambulation/Gait assistance: Min assist Ambulation Distance (Feet): 35 Feet Assistive device: Rolling walker (2 wheeled);1 person hand held assist Gait Pattern/deviations: Decreased stride length;Step-through pattern;Step-to pattern;Wide base of support;Drifts right/left Gait velocity: reduced Gait velocity interpretation: <1.8 ft/sec, indicate of risk for recurrent falls General Gait Details: pt is unsteady on his feet and needs assisst to control stanidng with RW  Stairs            Wheelchair Mobility     Modified Rankin (Stroke Patients Only)       Balance Overall balance assessment: Needs assistance Sitting-balance support: Feet supported;Bilateral upper extremity supported Sitting balance-Leahy Scale: Fair Sitting balance - Comments: dynamic sitting balance is poor     Standing balance-Leahy Scale: Poor                               Pertinent Vitals/Pain Pain Assessment: No/denies pain    Home Living Family/patient expects to be discharged to:: Private residence Living Arrangements: Spouse/significant other Available Help at Discharge: Family;Available 24 hours/day Type of Home: House Home Access: Stairs to enter Entrance Stairs-Rails: None Entrance Stairs-Number of Steps: 2 Home Layout: One level Home Equipment: Walker - 2 wheels;Cane - single point      Prior Function Level of Independence: Needs assistance   Gait / Transfers Assistance Needed: has been able to walk with [facility] with no help  ADL's / Homemaking Assistance Needed: wife is home to assist with housework        Hand Dominance   Dominant Hand: Right    Extremity/Trunk Assessment   Upper Extremity Assessment Upper Extremity Assessment: Generalized weakness    Lower Extremity Assessment Lower Extremity Assessment: Generalized weakness    Cervical / Trunk Assessment Cervical / Trunk Assessment: Normal  Communication   Communication: No difficulties  Cognition Arousal/Alertness: Awake/alert Behavior During Therapy: Impulsive Overall Cognitive Status: Impaired/Different from baseline Area of Impairment: Following commands;Safety/judgement;Awareness;Problem solving  Following Commands: Follows one step commands with increased time Safety/Judgement: Decreased awareness of safety;Decreased awareness of deficits Awareness: Intellectual Problem Solving: Slow processing;Difficulty sequencing;Requires verbal cues;Requires tactile cues General Comments: pt  is not aware of his fall risk,quick to walk away from bed      General Comments General comments (skin integrity, edema, etc.): pt was seen for evaluation of gait and note his difficulty in motor planning with RW    Exercises     Assessment/Plan    PT Assessment Patient needs continued PT services  PT Problem List Decreased strength;Decreased range of motion;Decreased activity tolerance;Decreased balance;Decreased mobility;Decreased coordination;Decreased cognition;Decreased knowledge of use of DME;Decreased safety awareness;Cardiopulmonary status limiting activity       PT Treatment Interventions DME instruction;Gait training;Stair training;Functional mobility training;Therapeutic activities;Therapeutic exercise;Balance training;Neuromuscular re-education;Cognitive remediation;Patient/family education    PT Goals (Current goals can be found in the Care Plan section)  Acute Rehab PT Goals Patient Stated Goal: none stated PT Goal Formulation: With patient/family Time For Goal Achievement: 03/15/18 Potential to Achieve Goals: Good    Frequency Min 2X/week   Barriers to discharge Inaccessible home environment;Decreased caregiver support wife will not be able to assist him    Co-evaluation               AM-PAC PT "6 Clicks" Daily Activity  Outcome Measure Difficulty turning over in bed (including adjusting bedclothes, sheets and blankets)?: Unable Difficulty moving from lying on back to sitting on the side of the bed? : Unable Difficulty sitting down on and standing up from a chair with arms (e.g., wheelchair, bedside commode, etc,.)?: Unable Help needed moving to and from a bed to chair (including a wheelchair)?: A Little Help needed walking in hospital room?: A Little Help needed climbing 3-5 steps with a railing? : A Lot 6 Click Score: 11    End of Session Equipment Utilized During Treatment: Gait belt Activity Tolerance: Patient tolerated treatment well;Patient  limited by fatigue Patient left: in bed;with call bell/phone within reach;with bed alarm set;with family/visitor present Nurse Communication: Mobility status PT Visit Diagnosis: Unsteadiness on feet (R26.81);Other abnormalities of gait and mobility (R26.89);History of falling (Z91.81);Muscle weakness (generalized) (M62.81);Difficulty in walking, not elsewhere classified (R26.2);Adult, failure to thrive (R62.7)    Time: 1610-9604 PT Time Calculation (min) (ACUTE ONLY): 25 min   Charges:   PT Evaluation $PT Eval Moderate Complexity: 1 Mod PT Treatments $Gait Training: 8-22 mins $Therapeutic Exercise: 8-22 mins   PT G Codes:   PT G-Codes **NOT FOR INPATIENT CLASS** Functional Assessment Tool Used: AM-PAC 6 Clicks Basic Mobility    Ramond Dial 03/01/2018, 8:59 PM   Mee Hives, PT MS Acute Rehab Dept. Number: [place]

## 2018-03-02 ENCOUNTER — Inpatient Hospital Stay: Payer: Medicare Other

## 2018-03-02 LAB — CBC
HCT: 42.6 % (ref 40.0–52.0)
Hemoglobin: 14.2 g/dL (ref 13.0–18.0)
MCH: 28.3 pg (ref 26.0–34.0)
MCHC: 33.3 g/dL (ref 32.0–36.0)
MCV: 84.9 fL (ref 80.0–100.0)
Platelets: 92 10*3/uL — ABNORMAL LOW (ref 150–440)
RBC: 5.02 MIL/uL (ref 4.40–5.90)
RDW: 16.4 % — ABNORMAL HIGH (ref 11.5–14.5)
WBC: 12.3 10*3/uL — ABNORMAL HIGH (ref 3.8–10.6)

## 2018-03-02 LAB — BASIC METABOLIC PANEL
Anion gap: 9 (ref 5–15)
BUN: 33 mg/dL — ABNORMAL HIGH (ref 6–20)
CO2: 20 mmol/L — ABNORMAL LOW (ref 22–32)
Calcium: 8.7 mg/dL — ABNORMAL LOW (ref 8.9–10.3)
Chloride: 107 mmol/L (ref 101–111)
Creatinine, Ser: 2.09 mg/dL — ABNORMAL HIGH (ref 0.61–1.24)
GFR calc Af Amer: 33 mL/min — ABNORMAL LOW (ref >60)
GFR calc non Af Amer: 29 mL/min — ABNORMAL LOW (ref >60)
Glucose, Bld: 114 mg/dL — ABNORMAL HIGH (ref 65–99)
Potassium: 3.9 mmol/L (ref 3.5–5.1)
Sodium: 136 mmol/L (ref 135–145)

## 2018-03-02 MED ORDER — DILTIAZEM HCL ER COATED BEADS 120 MG PO CP24: 120.0000 mg | ORAL_CAPSULE | Freq: Every day | ORAL | Status: DC

## 2018-03-02 MED ORDER — SODIUM CHLORIDE 0.9 % IV SOLN
1.0000 g | INTRAVENOUS | Status: DC
  Administered 2018-03-02 – 2018-03-04 (×3): 1 g via INTRAVENOUS
  Filled 2018-03-02 (×4): qty 10

## 2018-03-02 MED ORDER — SODIUM CHLORIDE 0.9 % IV SOLN
INTRAVENOUS | Status: DC
  Administered 2018-03-02 – 2018-03-03 (×2): via INTRAVENOUS

## 2018-03-02 MED ORDER — DILTIAZEM HCL 30 MG PO TABS
30.0000 mg | ORAL_TABLET | Freq: Four times a day (QID) | ORAL | Status: DC
  Administered 2018-03-02 – 2018-03-03 (×4): 30 mg via ORAL
  Filled 2018-03-02 (×4): qty 1

## 2018-03-02 NOTE — Progress Notes (Signed)
Pharmacy Antibiotic Note  George Bolton is a 77 y.o. male admitted on 02/28/2018 with UTI.  Pharmacy has been consulted for ceftriaxone dosing.  Plan: Will start ceftriaxone 1g IV daily  Height: 6' (182.9 cm) Weight: 196 lb (88.9 kg) IBW/kg (Calculated) : 77.6  Temp (24hrs), Avg:98.6 F (37 C), Min:97.9 F (36.6 C), Max:99.3 F (37.4 C)  Recent Labs  Lab 02/28/18 1338 02/28/18 1444 03/01/18 0327 03/02/18 0319  WBC  --  5.7 15.1* 12.3*  CREATININE 1.63*  --  2.20* 2.09*    Estimated Creatinine Clearance: 32 mL/min (A) (by C-G formula based on SCr of 2.09 mg/dL (H)).    Allergies  Allergen Reactions  . Cortisone     Thank you for allowing pharmacy to be a part of this patient's care.  Tobie Lords, PharmD, BCPS Clinical Pharmacist 03/02/2018

## 2018-03-02 NOTE — Clinical Social Work Note (Signed)
CSW met with patient and his wife to discuss SNF options.  Patient's wife stated they are looking to have patient return back home instead of going to a SNF for rehab.  PT is recommending SNF placement, CSW will follow in case patient and his wife change their mind.  Jones Broom. [place], MSW, [place]  03/02/2018 5:55 PM

## 2018-03-02 NOTE — Progress Notes (Signed)
[facility] [place] NAME: George Bolton    MR#:  [id number]  DATE OF BIRTH:  [birth year]  SUBJECTIVE:  CHIEF COMPLAINT:   Chief Complaint  Patient presents with  . Shortness of Breath  . Leg Swelling   - still feels very weak, renal function slightly improved off lasix -Remains tachycardic  REVIEW OF SYSTEMS:  Review of Systems  Constitutional: Negative for chills, fever and malaise/fatigue.  HENT: Negative for congestion, ear discharge, hearing loss and nosebleeds.   Eyes: Negative for blurred vision and double vision.  Respiratory: Positive for shortness of breath. Negative for cough and wheezing.   Cardiovascular: Positive for leg swelling. Negative for chest pain and palpitations.  Gastrointestinal: Negative for abdominal pain, constipation, diarrhea, nausea and vomiting.  Genitourinary: Negative for dysuria.  Musculoskeletal: Negative for myalgias.  Neurological: Negative for dizziness, focal weakness, seizures, weakness and headaches.  Psychiatric/Behavioral: Negative for depression.    DRUG ALLERGIES:   Allergies  Allergen Reactions  . Cortisone     VITALS:  Blood pressure (!) 127/92, pulse (!) 102, temperature 98.6 F (37 C), temperature source Oral, resp. rate 18, height 6' (1.829 m), weight 88.9 kg (196 lb), SpO2 98 %.  PHYSICAL EXAMINATION:  Physical Exam  GENERAL:  77 y.o.-year-old patient lying in the bed with no acute distress.  EYES: Pupils equal, round, reactive to light and accommodation. No scleral icterus. Extraocular muscles intact.  HEENT: Head atraumatic, normocephalic. Oropharynx and nasopharynx clear.  NECK:  Supple, no jugular venous distention. No thyroid enlargement, no tenderness.  LUNGS: Normal breath sounds bilaterally, no wheezing, rales,rhonchi or crepitation. No use of accessory muscles of respiration.  Decreased bibasilar breath sounds noted CARDIOVASCULAR: S1, S2 normal. No murmurs, rubs,  or gallops.  ABDOMEN: Soft, nontender, nondistended. Bowel sounds present. No organomegaly or mass.  EXTREMITIES: No cyanosis, or clubbing.  Left leg has 1+ edema compared to the right leg NEUROLOGIC: Cranial nerves II through XII are intact. Muscle strength 5/5 in all extremities. Sensation intact. Gait not checked.  Global weakness present PSYCHIATRIC: The patient is alert and oriented x 3.  SKIN: No obvious rash, lesion, or ulcer.    LABORATORY PANEL:   CBC Recent Labs  Lab 03/02/18 0319  WBC 12.3*  HGB 14.2  HCT 42.6  PLT 92*   ------------------------------------------------------------------------------------------------------------------  Chemistries  Recent Labs  Lab 03/02/18 0319  NA 136  K 3.9  CL 107  CO2 20*  GLUCOSE 114*  BUN 33*  CREATININE 2.09*  CALCIUM 8.7*   ------------------------------------------------------------------------------------------------------------------  Cardiac Enzymes Recent Labs  Lab 02/28/18 1338  TROPONINI 0.04*   ------------------------------------------------------------------------------------------------------------------  RADIOLOGY:  Dg Chest 2 View  Result Date: 02/28/2018 CLINICAL DATA:  Shortness of breath. EXAM: CHEST - 2 VIEW COMPARISON:  Chest x-ray dated October 21, 2013. FINDINGS: The heart size and mediastinal contours are within normal limits. Mild pulmonary vascular congestion. No focal consolidation, pleural effusion, or pneumothorax. No acute osseous abnormality. IMPRESSION: Mild pulmonary vascular congestion. Electronically Signed   By: Titus Dubin M.D.   On: 02/28/2018 14:05   US Renal  Result Date: 03/02/2018 CLINICAL DATA:  77 year old male with acute renal failure. Initial encounter. EXAM: RENAL / URINARY TRACT ULTRASOUND COMPLETE COMPARISON:  01/11/2017 CT. FINDINGS: Right Kidney: Length: 9.3 cm. Lobulated contour. No hydronephrosis. Several renal cysts largest measuring 4 cm and 2.4 cm. Upper pole  cyst with tiny associated calcification. CT detected lower pole cysts not as well delineated on present ultrasound. Left  Kidney: Length: 13.1 cm. No hydronephrosis. At least 3 cysts largest measuring 6.3 cm and 5.6 cm. Bladder: Enlarged prostate gland impresses upon the bladder base. IMPRESSION: No hydronephrosis. Right kidney smaller than the left. Multiple bilateral renal cysts largest on the left measures up to 6.3 cm. Enlarged prostate gland impresses upon the bladder base. Clinical and laboratory correlation recommended to evaluate prostate gland. Electronically Signed   By: Genia Del M.D.   On: 03/02/2018 10:21   US Venous Img Lower Bilateral  Result Date: 03/01/2018 CLINICAL DATA:  Chronic edema of bilateral lower extremities. EXAM: BILATERAL LOWER EXTREMITY VENOUS DOPPLER ULTRASOUND TECHNIQUE: Gray-scale sonography with graded compression, as well as color Doppler and duplex ultrasound were performed to evaluate the lower extremity deep venous systems from the level of the common femoral vein and including the common femoral, femoral, profunda femoral, popliteal and calf veins including the posterior tibial, peroneal and gastrocnemius veins when visible. The superficial great saphenous vein was also interrogated. Spectral Doppler was utilized to evaluate flow at rest and with distal augmentation maneuvers in the common femoral, femoral and popliteal veins. COMPARISON:  01/12/2017 FINDINGS: RIGHT LOWER EXTREMITY Common Femoral Vein: No evidence of thrombus. Normal compressibility, respiratory phasicity and response to augmentation. Saphenofemoral Junction: No evidence of thrombus. Normal compressibility and flow on color Doppler imaging. Profunda Femoral Vein: No evidence of thrombus. Normal compressibility and flow on color Doppler imaging. Femoral Vein: No evidence of thrombus. Normal compressibility, respiratory phasicity and response to augmentation. Popliteal Vein: No evidence of thrombus. Normal  compressibility, respiratory phasicity and response to augmentation. Calf Veins: No evidence of thrombus. Normal compressibility and flow on color Doppler imaging. Superficial Great Saphenous Vein: No evidence of thrombus. Normal compressibility. Venous Reflux:  None. Other Findings: No evidence of superficial thrombophlebitis or abnormal fluid collection. LEFT LOWER EXTREMITY Common Femoral Vein: No evidence of thrombus. Normal compressibility, respiratory phasicity and response to augmentation. Saphenofemoral Junction: No evidence of thrombus. Normal compressibility and flow on color Doppler imaging. Profunda Femoral Vein: No evidence of thrombus. Normal compressibility and flow on color Doppler imaging. Femoral Vein: No evidence of thrombus. Normal compressibility, respiratory phasicity and response to augmentation. Popliteal Vein: No evidence of thrombus. Normal compressibility, respiratory phasicity and response to augmentation. Calf Veins: No evidence of thrombus. Normal compressibility and flow on color Doppler imaging. Superficial Great Saphenous Vein: No evidence of thrombus. Normal compressibility. Venous Reflux:  None. Other Findings: No evidence of superficial thrombophlebitis or abnormal fluid collection. IMPRESSION: No evidence of bilateral lower extremity deep venous thrombosis. Electronically Signed   By: Aletta Edouard M.D.   On: 03/01/2018 14:06    EKG:   Orders placed or performed during the hospital encounter of 02/28/18  . ED EKG within 10 minutes  . ED EKG within 10 minutes  . EKG 12-Lead  . EKG 12-Lead  . EKG 12-Lead  . EKG 12-Lead    ASSESSMENT AND PLAN:   77 year old male with past medical history significant for CHF, CKD stage III, hypertension and A. fib presents to hospital secondary to worsening shortness of breath and lower extremity swelling.  1.  Acute on chronic diastolic CHF exacerbation-admitted, IV Lasix was given. -Which caused worsening of his BUN and  creatinine.  The Lasix has been on hold. -Chronic left leg edema, likely from venous insufficiency. -Off supplemental oxygen.  Doppler of the legs is negative for any DVT.  2.  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  Discussed with cardiology,  recommended outpatient follow-up.  Because of his decrease in LV cavity size from the hypertrophy, they have recommended that the patient can be on the wet side rather than dry side. -Patient does have orthostatic tachycardia.  Will give gentle hydration today.  Discontinue diuretics at discharge. - on metoprolol and added cardizem  3.  Acute renal failure on CKD stage III-Baseline creatinine seems to be at 1.7   - discontinued lasix, gentle hydration today, renal US  4.  Hypertension-continue metoprolol and added cardizem  5.  Hypokalemia- replaced  6.  DVT prophylaxis-Lovenox  7.  Acute cystitis-urine analysis positive for infection.  Urine cultures ordered -Started on Rocephin  Physical therapy recommended rehab.  However reevaluate tomorrow to see if patient improves-  Updated his wife at bedside   All the records are reviewed and case discussed with Care Management/Social Workerr. Management plans discussed with the patient, family and they are in agreement.  CODE STATUS: Full code  TOTAL TIME TAKING CARE OF THIS PATIENT: 36 minutes.   POSSIBLE D/C IN 1-2 to DAYS, DEPENDING ON CLINICAL CONDITION.   Gladstone Lighter M.D on 03/02/2018 at 1:57 PM  Between 7am to 6pm - Pager - [phone]  After 6pm go to [email or web address] - password [email or web address] [facility]  Office  [phone]  CC: Primary care physician; Denton Lank, MD

## 2018-03-02 NOTE — Plan of Care (Signed)
Nutrition Education Note  RD consulted for nutrition education regarding CHF.  77 year old male with PMHx of A-fib, CHF, gout, HTN admitted with acute on chronic diastolic CHF exacerbation, acute renal failure on CKD III.  Met with patient and his wife at bedside. Patient reports that yesterday he had a decreased appetite. His appetite feels better today and he is ready to start eating. He reports that he typically has a good appetite and intake. He is weight stable and UBW is 197-202 lbs. He typically eats 2 meals per day. Breakfast is eggs, [place] bacon, and toast. Dinner is usually baked chicken and vegetables. He has learned about reducing sodium intake before and already avoids processed foods and cooking with salt. He reports that prior to this admission he had not learned about his fluid restriction. He has a scale at home and can weigh himself daily.  RD provided "Low Sodium Nutrition Therapy" handout from the Academy of [employer]. Reviewed patient's dietary recall. Provided examples on ways to decrease sodium intake in diet. Discouraged intake of processed foods and use of salt shaker. Encouraged fresh fruits and vegetables as well as whole grain sources of carbohydrates to maximize fiber intake.   RD discussed why it is important for patient to adhere to diet recommendations, and emphasized the role of fluids, foods to avoid, and importance of weighing self daily. Teach back method used.  Expect good compliance.  Body mass index is 26.58 kg/m. Pt meets criteria for overweight based on current BMI.  Current diet order is Heart Healthy. Meal completion not yet recorded but patient reports he typically has a good appetite and intake. Labs and medications reviewed. No further nutrition interventions warranted at this time. RD contact information provided. If additional nutrition issues arise, please re-consult RD.   Willey Blade, MS, [place], LDN Office: [phone] Pager:  [phone] After Hours/Weekend Pager: [phone]

## 2018-03-03 ENCOUNTER — Inpatient Hospital Stay (HOSPITAL_COMMUNITY)
Admit: 2018-03-03 | Discharge: 2018-03-03 | Disposition: A | Discharge status: Home / Self Care | Payer: Medicare Other | Attending: Physician Assistant | Admitting: Physician Assistant

## 2018-03-03 ENCOUNTER — Encounter: Payer: Self-pay | Admitting: Physician Assistant

## 2018-03-03 DIAGNOSIS — I5020 Unspecified systolic (congestive) heart failure: Secondary | ICD-10-CM

## 2018-03-03 DIAGNOSIS — N170 Acute kidney failure with tubular necrosis: Secondary | ICD-10-CM

## 2018-03-03 DIAGNOSIS — I4891 Unspecified atrial fibrillation: Secondary | ICD-10-CM

## 2018-03-03 DIAGNOSIS — I509 Heart failure, unspecified: Secondary | ICD-10-CM

## 2018-03-03 DIAGNOSIS — I5030 Unspecified diastolic (congestive) heart failure: Secondary | ICD-10-CM

## 2018-03-03 DIAGNOSIS — R0603 Acute respiratory distress: Secondary | ICD-10-CM

## 2018-03-03 DIAGNOSIS — J810 Acute pulmonary edema: Secondary | ICD-10-CM

## 2018-03-03 LAB — CBC
HCT: 42.4 % (ref 40.0–52.0)
Hemoglobin: 14.2 g/dL (ref 13.0–18.0)
MCH: 28.5 pg (ref 26.0–34.0)
MCHC: 33.5 g/dL (ref 32.0–36.0)
MCV: 85 fL (ref 80.0–100.0)
Platelets: 86 10*3/uL — ABNORMAL LOW (ref 150–440)
RBC: 4.98 MIL/uL (ref 4.40–5.90)
RDW: 16.7 % — ABNORMAL HIGH (ref 11.5–14.5)
WBC: 9.5 10*3/uL (ref 3.8–10.6)

## 2018-03-03 LAB — BASIC METABOLIC PANEL
Anion gap: 8 (ref 5–15)
BUN: 36 mg/dL — ABNORMAL HIGH (ref 6–20)
CO2: 21 mmol/L — ABNORMAL LOW (ref 22–32)
Calcium: 8.4 mg/dL — ABNORMAL LOW (ref 8.9–10.3)
Chloride: 105 mmol/L (ref 101–111)
Creatinine, Ser: 1.68 mg/dL — ABNORMAL HIGH (ref 0.61–1.24)
GFR calc Af Amer: 43 mL/min — ABNORMAL LOW (ref >60)
GFR calc non Af Amer: 37 mL/min — ABNORMAL LOW (ref >60)
Glucose, Bld: 138 mg/dL — ABNORMAL HIGH (ref 65–99)
Potassium: 3.5 mmol/L (ref 3.5–5.1)
Sodium: 134 mmol/L — ABNORMAL LOW (ref 135–145)

## 2018-03-03 LAB — ECHOCARDIOGRAM LIMITED
Height: 72 in
Weight: 3179.2 oz

## 2018-03-03 LAB — MAGNESIUM: Magnesium: 2 mg/dL (ref 1.7–2.4)

## 2018-03-03 LAB — TSH: TSH: 1.133 u[IU]/mL (ref 0.350–4.500)

## 2018-03-03 MED ORDER — SODIUM CHLORIDE 0.9% FLUSH
3.0000 mL | Freq: Two times a day (BID) | INTRAVENOUS | Status: DC
  Administered 2018-03-03: 3 mL via INTRAVENOUS

## 2018-03-03 MED ORDER — METOPROLOL TARTRATE 50 MG PO TABS
50.0000 mg | ORAL_TABLET | Freq: Two times a day (BID) | ORAL | Status: DC
  Administered 2018-03-03 – 2018-03-04 (×2): 50 mg via ORAL
  Filled 2018-03-03 (×2): qty 1

## 2018-03-03 MED ORDER — DILTIAZEM HCL ER COATED BEADS 180 MG PO CP24
180.0000 mg | ORAL_CAPSULE | Freq: Every day | ORAL | Status: DC
  Administered 2018-03-03 – 2018-03-04 (×2): 180 mg via ORAL
  Filled 2018-03-03 (×3): qty 1

## 2018-03-03 MED ORDER — ENOXAPARIN SODIUM 40 MG/0.4ML ~~LOC~~ SOLN
40.0000 mg | SUBCUTANEOUS | Status: DC
  Administered 2018-03-03: 40 mg via SUBCUTANEOUS
  Filled 2018-03-03: qty 0.4

## 2018-03-03 MED ORDER — POTASSIUM CHLORIDE CRYS ER 20 MEQ PO TBCR
40.0000 meq | EXTENDED_RELEASE_TABLET | Freq: Once | ORAL | Status: AC
  Administered 2018-03-03: 40 meq via ORAL
  Filled 2018-03-03: qty 2

## 2018-03-03 MED ORDER — PERFLUTREN LIPID MICROSPHERE
1.0000 mL | INTRAVENOUS | Status: AC | PRN
  Filled 2018-03-03: qty 10

## 2018-03-03 MED ORDER — ENSURE ENLIVE PO LIQD
237.0000 mL | Freq: Two times a day (BID) | ORAL | Status: DC
  Administered 2018-03-03: 237 mL via ORAL

## 2018-03-03 NOTE — Progress Notes (Signed)
Patient's heart rate elevated this morning.  Metoprolol and Cardizem doses have been adjusted.  Cardiology recommended 1 more day in the hospital.  Physical therapy now recommending home health.  Anticipate discharge tomorrow

## 2018-03-03 NOTE — Consult Note (Signed)
Cardiology Consultation:   Patient ID: WOOD [id number]; [id number]; 08/26/1941   Admit date: 02/28/2018 Date of Consult: 03/03/2018  Primary Care Provider: Denton Lank, MD Primary Cardiologist: new to [facility] - consult by Rockey Situ   Patient Profile:   George Bolton is a 77 y.o. male with a hx of chronic Afib not on full dose anticoagulation, CKD stage III, lower extremity swelling, HTN, and gout who is being seen today for the evaluation of HOCM and chronic Afib at the request of Dr. Tressia Miners, MD.  History of Present Illness:   Mr. Juhnke reports previously being followed by cardiology many years prior for an "irregular heart beat." He has Afib that dates back to at least 2002 and EKG demonstrate he has possibly been in Afib since 2014, not on anticoagulation. There is mention in Care Everywhere of a diagnosis of CHF, though prior echo from 12/2016 showed an EF of 55-60% with mild concentric LVH. He reports a long history of lower extremity, left worse than right, no formal diagnosis that he is aware of.   He presented to [facility] via [facility] on 5/6 with a 1-2 day history of worsening SOB and lower extremity swelling. No chest pain, palpitations, orthopnea, or early satiety. He has not been watching his PO fluid or salt intake. He does not weight himself at home. At home, he has been on Lasix 20 mg prn. He was sent to the ED from [facility] as he was felt to be volume overloaded. Upon his admission on 5/6 he was placed on BiPAP and given IV Lasix. BiPAP was able to be weaned while still in the ED. CXR showed mild pulmonary vascular congestion. Lower extremity ultrasound was negative for DVT. Following this IV Lasix, his renal function increased from an admission SCr of 1.25-->1.63-->2.20-->2.09-->1.68 following holding of Lasix and IV fluid. Renal ultrasound was negative for hydronephrosis. Echo on 5/7 showed a vigorous LV systolic function with an EF of 70-75%, moderate LVH, near  complete obliteration of the mid and apical LV cavity, mildly dilated LA, RV cavity size was normal, with normal RV wall thickness, and mildly reduced RVSF, mildly dilated RA. Case was discussed with cardiology over the phone (Drs. Tressia Miners and Rockey Situ) with recommendation to give IV fluids. He has remained in Afib with initial ventricular rates being tachycardic into the 130s bpm and are now reasonably controlled in the 70s to low 100s bpm. Of note, his platelet count have been down trending this admission from a level of > 300 one year prior to 86 today. He has been on prophylactic dose of Lovenox this admission. He takes 2 ASA 325 mg daily at home and has been continued on ASA 325 mg daily during his admission to date. Currently feels like his SOB and lower extremity swelling is improved.   Of note, he denies any family history of sudden cardiac death. The patient has never suffered a syncopal episode. He has been active throughout his life with sports as a child, Architect as an young adult followed by couples tennis with his wife later in life. He is without complaints this morning.    Past Medical History:  Diagnosis Date  . Chronic atrial fibrillation ([place])    a. Afib dates back to at least 2002 when reviewing prior EKG with EKGs from 2014 onward showing Afib; b. CHADS2VASc at least 4 (CHF, HTN, age x 2)  . Gout   . HOCM (hypertrophic obstructive cardiomyopathy) ([place])  a. TTE 5/19: EF 70-75%, mod LVH, near complete obliteration of the mid and apical LV cavity (can be seen in apical hypertrophic CM), mildly dilated LA, RV cavity size nl, RV wall thickness nl, RVSF mildly reduced, mildly dilated RA  . Hypertension     Past Surgical History:  Procedure Laterality Date  . CHOLECYSTECTOMY N/A 01/13/2017   Procedure: LAPAROSCOPIC CHOLECYSTECTOMY;  Surgeon: Jules Husbands, MD;  Location: [facility] ORS;  Service: General;  Laterality: N/A;     Home Meds: Prior to Admission medications     Medication Sig Start Date End Date Taking? Authorizing Provider  aspirin 325 MG tablet Take 325 mg by mouth daily.   Yes [provider]  atorvastatin (LIPITOR) 40 MG tablet Take 1 tablet by mouth daily.   Yes [provider]  felodipine (PLENDIL) 10 MG 24 hr tablet Take 1 tablet by mouth daily.   Yes [provider]  furosemide (LASIX) 40 MG tablet Take 20 mg by mouth daily as needed for fluid or edema.   Yes [provider]  hydrALAZINE (APRESOLINE) 50 MG tablet Take 1 tablet by mouth 3 (three) times daily. 12/26/16  Yes [provider]    Inpatient Medications: Scheduled Meds: . atorvastatin  40 mg Oral Daily  . diltiazem  180 mg Oral Daily  . enoxaparin (LOVENOX) injection  30 mg Subcutaneous Q24H  . metoprolol tartrate  50 mg Oral BID  . potassium chloride  40 mEq Oral Once   Continuous Infusions: . sodium chloride 60 mL/hr at 03/03/18 0801  . cefTRIAXone (ROCEPHIN) IVPB 1 gram/100 mL NS (Mini-Bag Plus) Stopped (03/03/18 0830)   PRN Meds: acetaminophen **OR** acetaminophen, albuterol, ondansetron **OR** ondansetron (ZOFRAN) IV, polyethylene glycol  Allergies:   Allergies  Allergen Reactions  . Cortisone     Social History:   Social History   Socioeconomic History  . Marital status: Married    Spouse name: Not on file  . Number of children: Not on file  . Years of education: Not on file  . Highest education level: Not on file  Occupational History  . Not on file  Social Needs  . Financial resource strain: Not on file  . Food insecurity:    Worry: Not on file    Inability: Not on file  . Transportation needs:    Medical: Not on file    Non-medical: Not on file  Tobacco Use  . Smoking status: Never Smoker  . Smokeless tobacco: Never Used  Substance and Sexual Activity  . Alcohol use: No  . Drug use: No  . Sexual activity: Not on file  Lifestyle  . Physical activity:    Days per week: Not on file    Minutes per  session: Not on file  . Stress: Not on file  Relationships  . Social connections:    Talks on phone: Not on file    Gets together: Not on file    Attends religious service: Not on file    Active member of club or organization: Not on file    Attends meetings of clubs or organizations: Not on file    Relationship status: Not on file  . Intimate partner violence:    Fear of current or ex partner: Not on file    Emotionally abused: Not on file    Physically abused: Not on file    Forced sexual activity: Not on file  Other Topics Concern  . Not on file  Social History Narrative  .  Not on file     Family History:   Family History  Problem Relation Age of Onset  . Stroke Mother     ROS:  Review of Systems  Constitutional: Positive for malaise/fatigue. Negative for chills, diaphoresis, fever and weight loss.  HENT: Negative for congestion.   Eyes: Negative for discharge and redness.  Respiratory: Positive for shortness of breath. Negative for cough, hemoptysis, sputum production and wheezing.   Cardiovascular: Positive for leg swelling. Negative for chest pain, palpitations, orthopnea, claudication and PND.  Gastrointestinal: Negative for abdominal pain, blood in stool, heartburn, melena, nausea and vomiting.  Genitourinary: Negative for hematuria.  Musculoskeletal: Negative for falls and myalgias.  Skin: Negative for rash.  Neurological: Positive for weakness. Negative for dizziness, tingling, tremors, sensory change, speech change, focal weakness and loss of consciousness.  Endo/Heme/Allergies: Does not bruise/bleed easily.  Psychiatric/Behavioral: Negative for substance abuse. The patient is not nervous/anxious.   All other systems reviewed and are negative.     Physical Exam/Data:   Vitals:   03/02/18 1953 03/03/18 0403 03/03/18 0500 03/03/18 0824  BP: 125/68 (!) 142/95  120/83  Pulse: (!) 109 93  93  Resp: 18 20  16   Temp: 98.6 F (37 C) 98.7 F (37.1 C)      TempSrc: Oral Oral    SpO2: 96% 97%  98%  Weight:   198 lb 11.2 oz (90.1 kg)   Height:        Intake/Output Summary (Last 24 hours) at 03/03/2018 1100 Last data filed at 03/03/2018 1046 Gross per 24 hour  Intake 1015 ml  Output 625 ml  Net 390 ml   Filed Weights   03/01/18 0430 03/02/18 0304 03/03/18 0500  Weight: 193 lb 8 oz (87.8 kg) 196 lb (88.9 kg) 198 lb 11.2 oz (90.1 kg)   Body mass index is 26.95 kg/m.   Physical Exam: General: Elderly appearing; well developed, well nourished, in no acute distress. Head: Normocephalic, atraumatic, sclera non-icteric, no xanthomas, nares without discharge.  Neck: Negative for carotid bruits. JVD not elevated. Lungs: Clear bilaterally to auscultation without wheezes, rales, or rhonchi. Breathing is unlabored. Heart: Irregularly irregular with S1 S2. No murmurs, rubs, or gallops appreciated. Abdomen: Soft, non-tender, non-distended with normoactive bowel sounds. No hepatomegaly. No rebound/guarding. No obvious abdominal masses. Msk:  Strength and tone appear normal for age. Extremities: No clubbing or cyanosis. No edema. Distal pedal pulses are 2+ and equal bilaterally. Neuro: Alert and oriented X 3. No facial asymmetry. No focal deficit. Moves all extremities spontaneously. Psych:  Responds to questions appropriately with a normal affect.   EKG:  The EKG was personally reviewed and demonstrates: Afib, 86 bpm, lateral TWI Telemetry:  Telemetry was personally reviewed and demonstrates: Afib, 80s to low 100s bpm  Weights: Filed Weights   03/01/18 0430 03/02/18 0304 03/03/18 0500  Weight: 193 lb 8 oz (87.8 kg) 196 lb (88.9 kg) 198 lb 11.2 oz (90.1 kg)    Relevant CV Studies: TTE 03/01/2018: Study Conclusions  - Left ventricle: The cavity size was normal. Wall thickness was   increased in a pattern of moderate LVH. There is near complete   obliteration of the mid and apical LV cavity, which can be seen   with apical hypertrophic  cardiomyopathy. Systolic function was   hyperdynamic. The estimated ejection fraction was in the range of   70% to 75%. - Left atrium: The atrium was mildly dilated. - Right ventricle: The cavity size was normal. Wall thickness was  normal. Systolic function was mildly reduced. - Right atrium: The atrium was mildly dilated.  Laboratory Data:  Chemistry Recent Labs  Lab 03/01/18 0327 03/02/18 0319 03/03/18 0448  NA 139 136 134*  K 3.3* 3.9 3.5  CL 107 107 105  CO2 23 20* 21*  GLUCOSE 160* 114* 138*  BUN 28* 33* 36*  CREATININE 2.20* 2.09* 1.68*  CALCIUM 8.5* 8.7* 8.4*  GFRNONAA 27* 29* 37*  GFRAA 31* 33* 43*  ANIONGAP 9 9 8     No results for input(s): PROT, ALBUMIN, AST, ALT, ALKPHOS, BILITOT in the last 168 hours. Hematology Recent Labs  Lab 03/01/18 0327 03/02/18 0319 03/03/18 0448  WBC 15.1* 12.3* 9.5  RBC 5.01 5.02 4.98  HGB 14.4 14.2 14.2  HCT 42.7 42.6 42.4  MCV 85.3 84.9 85.0  MCH 28.7 28.3 28.5  MCHC 33.7 33.3 33.5  RDW 16.3* 16.4* 16.7*  PLT 112* 92* 86*   Cardiac Enzymes Recent Labs  Lab 02/28/18 1338  TROPONINI 0.04*   No results for input(s): TROPIPOC in the last 168 hours.  BNPNo results for input(s): BNP, PROBNP in the last 168 hours.  DDimer No results for input(s): DDIMER in the last 168 hours.  Radiology/Studies:  Dg Chest 2 View  Result Date: 02/28/2018 IMPRESSION: Mild pulmonary vascular congestion. Electronically Signed   By: Titus Dubin M.D.   On: 02/28/2018 14:05   US Renal  Result Date: 03/02/2018 IMPRESSION: No hydronephrosis. Right kidney smaller than the left. Multiple bilateral renal cysts largest on the left measures up to 6.3 cm. Enlarged prostate gland impresses upon the bladder base. Clinical and laboratory correlation recommended to evaluate prostate gland. Electronically Signed   By: Genia Del M.D.   On: 03/02/2018 10:21   US Venous Img Lower Bilateral  Result Date: 03/01/2018 IMPRESSION: No evidence of bilateral  lower extremity deep venous thrombosis. Electronically Signed   By: Aletta Edouard M.D.   On: 03/01/2018 14:06    Assessment and Plan:   1. Chronic Afib: -It appears his Afib dates back to at least 2002 when reviewing EKGs from [place] and EKGs from 2104 onward all demonstrate Afib -Recommend starting of Eliquis 5 mg bid with close monitoring of CBC, however patient refuses [place] at this time, stating he wants to think about it. He is aware of stroke risk -Stop ASA -Ventricular rates are suboptimally controlled, increase Lopressor to 50 mg bid -Continue Cardizem 180 mg daily  -Magnesium at goal -Check TSH  2. Possible apical HOCM: -Will have a limited echo with Definity today to further evaluate  -If Definity shows findings consistent HOCM he may need outpatient cardiac MRI and EP evaluation for possible ICD -If Repeat limited echo with Definity shows LVH would medically manage with BP/HR control  -Maintain adequate fluid status -Lopressor increased as above   3. Elevated troponin: -Troponin at time of admission minimally elevated at 0.04, not trended -Never with chest pain -Likely supply demand ischemia in the setting of the above -Echo as above  4. Orthostatic hypotension: -Status post IV fluid bolus  5. Lower extremity swelling: -Resolved  6. Acute on CKD stage III: -Improving with cessation of Lasix and addition of IV fluids -Monitor   7. Thrombocytopenia: -Of uncertain etiology, ? HITT -Has been on DVT PPX dose of Lovenox during admission -Platelets have down trended from 311 one year prior to a value of 86 on 03/03/18 -May benefit from hematology evaluation  -Per IM  8. Hypokalemia: -Improved -Replete to goal >  4.0   For questions or updates, please contact [place] Please consult [email or web address] for contact info under Cardiology/STEMI.   Signed, Christell Faith, PA-C [place] Pager: [phone] 03/03/2018, 11:00 AM

## 2018-03-03 NOTE — Plan of Care (Signed)
Afib with RVR noted with activity to bathroom.

## 2018-03-03 NOTE — Progress Notes (Signed)
Physical Therapy Treatment Patient Details Name: George Bolton MRN: [id number] DOB: 14-Jul-1941 Today's Date: 03/03/2018    History of Present Illness 77 yo male with admission after developing SOB and LE edema, now with acute renal failure, CHF acute on chronic, cleared for DVT's with PMHx:  leukocytosis, HTN, low K+, CHF, bipolar, cannabis use, a-fib, gout, irregular heart beat    PT Comments    Pt reported he is feeling better today and wants to return home vs SNF.  Bed mobility without assist.  Stood with min guard and transferred to bedside commode with supervision.  No results.  Pt stood and was able to walk a complete lap around unit and back to room with walker and min guard. Pt does have decreased step length and height bilaterally, forward flexed posture and needs verbal cues to keep walker closer to him for safety.  Wife in attendance and agrees with pt that he is close to his baseline gait.  No LOB or buckling noted.  Pt requested to go and sit on toilet after gait.  CNA was notified that he was on commode.  Pt to use call bell when ready.    Discussed with MD and Randall Hiss from [facility].  Pt and wife would like to return home and pt has demonstrated improved mobility with +1 assist.  Will update recommendations.   Follow Up Recommendations  Home health PT;Supervision for mobility/OOB     Equipment Recommendations  Rolling walker with 5" wheels    Recommendations for Other Services       Precautions / Restrictions Precautions Precautions: Fall Restrictions Weight Bearing Restrictions: No    Mobility  Bed Mobility Overal bed mobility: Modified Independent Bed Mobility: Supine to Sit     Supine to sit: Modified independent (Device/Increase time)        Transfers Overall transfer level: Needs assistance Equipment used: Rolling walker (2 wheeled) Transfers: Sit to/from Stand Sit to Stand: Supervision            Ambulation/Gait Ambulation/Gait assistance: Min  guard Ambulation Distance (Feet): 220 Feet Assistive device: Rolling walker (2 wheeled) Gait Pattern/deviations: Trunk flexed Gait velocity: reduced   General Gait Details: decreased step length and height bilaterally    Stairs             Wheelchair Mobility    Modified Rankin (Stroke Patients Only)       Balance Overall balance assessment: Needs assistance Sitting-balance support: Feet supported;Bilateral upper extremity supported Sitting balance-Leahy Scale: Good     Standing balance support: Bilateral upper extremity supported Standing balance-Leahy Scale: Poor Standing balance comment: relies heavily on walker                             Cognition Arousal/Alertness: Awake/alert Behavior During Therapy: WFL for tasks assessed/performed Overall Cognitive Status: Within Functional Limits for tasks assessed                                        Exercises      General Comments        Pertinent Vitals/Pain Pain Assessment: No/denies pain    Home Living                      Prior Function            PT Goals (current  goals can now be found in the care plan section) Progress towards PT goals: Progressing toward goals    Frequency    Min 2X/week      PT Plan Discharge plan needs to be updated    Co-evaluation              AM-PAC PT "6 Clicks" Daily Activity  Outcome Measure  Difficulty turning over in bed (including adjusting bedclothes, sheets and blankets)?: None Difficulty moving from lying on back to sitting on the side of the bed? : None Difficulty sitting down on and standing up from a chair with arms (e.g., wheelchair, bedside commode, etc,.)?: A Little Help needed moving to and from a bed to chair (including a wheelchair)?: A Little Help needed walking in hospital room?: A Little Help needed climbing 3-5 steps with a railing? : A Lot 6 Click Score: 19    End of Session Equipment  Utilized During Treatment: Gait belt Activity Tolerance: Patient tolerated treatment well Patient left: Other (comment);with family/visitor present Nurse Communication: Other (comment);Mobility status       Time: 1031-5945 PT Time Calculation (min) (ACUTE ONLY): 16 min  Charges:  $Gait Training: 8-22 mins                    G Codes:       Chesley Noon, PTA 03/03/18, 12:21 PM'

## 2018-03-03 NOTE — Clinical Social Work Note (Addendum)
CSW received referral for SNF, PT originally recommended SNF but patient has improved and now are recommending home health.  Case discussed with case manager and plan is to discharge home with home health.  CSW to sign off please re-consult if social work needs arise, [place] notified case Freight forwarder and physician.  Jones Broom. [place], MSW, [place]

## 2018-03-03 NOTE — Progress Notes (Signed)
He is annoyed with his continued coughing.  Wants to try tylenol to control his cough.

## 2018-03-03 NOTE — Progress Notes (Signed)
[facility] NAME: George Bolton    MR#:  [id number]  DATE OF BIRTH:  23-Mar-1941  SUBJECTIVE:  CHIEF COMPLAINT:   Chief Complaint  Patient presents with  . Shortness of Breath  . Leg Swelling   -feels about the same.  Complains of poor appetite - awaiting PT consult. HR elevated   REVIEW OF SYSTEMS:  Review of Systems  Constitutional: Negative for chills, fever and malaise/fatigue.  HENT: Negative for congestion, ear discharge, hearing loss and nosebleeds.   Eyes: Negative for blurred vision and double vision.  Respiratory: Positive for shortness of breath. Negative for cough and wheezing.   Cardiovascular: Positive for leg swelling. Negative for chest pain and palpitations.  Gastrointestinal: Negative for abdominal pain, constipation, diarrhea, nausea and vomiting.  Genitourinary: Negative for dysuria.  Musculoskeletal: Negative for myalgias.  Neurological: Negative for dizziness, focal weakness, seizures, weakness and headaches.  Psychiatric/Behavioral: Negative for depression.    DRUG ALLERGIES:   Allergies  Allergen Reactions  . Cortisone     VITALS:  Blood pressure 120/83, pulse 93, temperature 98.7 F (37.1 C), temperature source Oral, resp. rate 16, height 6' (1.829 m), weight 90.1 kg (198 lb 11.2 oz), SpO2 98 %.  PHYSICAL EXAMINATION:  Physical Exam  GENERAL:  77 y.o.-year-old patient lying in the bed with no acute distress.  EYES: Pupils equal, round, reactive to light and accommodation. No scleral icterus. Extraocular muscles intact.  HEENT: Head atraumatic, normocephalic. Oropharynx and nasopharynx clear.  NECK:  Supple, no jugular venous distention. No thyroid enlargement, no tenderness.  LUNGS: Normal breath sounds bilaterally, no wheezing, rales,rhonchi or crepitation. No use of accessory muscles of respiration.  Decreased bibasilar breath sounds noted CARDIOVASCULAR: S1, S2 normal. No murmurs, rubs,  or gallops.  ABDOMEN: Soft, nontender, nondistended. Bowel sounds present. No organomegaly or mass.  EXTREMITIES: No cyanosis, or clubbing.  Left leg has 1+ edema compared to the right leg NEUROLOGIC: Cranial nerves II through XII are intact. Muscle strength 5/5 in all extremities. Sensation intact. Gait not checked.  Global weakness present PSYCHIATRIC: The patient is alert and oriented x 3.  SKIN: No obvious rash, lesion, or ulcer.    LABORATORY PANEL:   CBC Recent Labs  Lab 03/03/18 0448  WBC 9.5  HGB 14.2  HCT 42.4  PLT 86*   ------------------------------------------------------------------------------------------------------------------  Chemistries  Recent Labs  Lab 03/03/18 0448  NA 134*  K 3.5  CL 105  CO2 21*  GLUCOSE 138*  BUN 36*  CREATININE 1.68*  CALCIUM 8.4*  MG 2.0   ------------------------------------------------------------------------------------------------------------------  Cardiac Enzymes Recent Labs  Lab 02/28/18 1338  TROPONINI 0.04*   ------------------------------------------------------------------------------------------------------------------  RADIOLOGY:  US Renal  Result Date: 03/02/2018 CLINICAL DATA:  77 year old male with acute renal failure. Initial encounter. EXAM: RENAL / URINARY TRACT ULTRASOUND COMPLETE COMPARISON:  01/11/2017 CT. FINDINGS: Right Kidney: Length: 9.3 cm. Lobulated contour. No hydronephrosis. Several renal cysts largest measuring 4 cm and 2.4 cm. Upper pole cyst with tiny associated calcification. CT detected lower pole cysts not as well delineated on present ultrasound. Left Kidney: Length: 13.1 cm. No hydronephrosis. At least 3 cysts largest measuring 6.3 cm and 5.6 cm. Bladder: Enlarged prostate gland impresses upon the bladder base. IMPRESSION: No hydronephrosis. Right kidney smaller than the left. Multiple bilateral renal cysts largest on the left measures up to 6.3 cm. Enlarged prostate gland impresses upon  the bladder base. Clinical and laboratory correlation recommended to evaluate prostate gland. Electronically Signed  By: Genia Del M.D.   On: 03/02/2018 10:21   US Venous Img Lower Bilateral  Result Date: 03/01/2018 CLINICAL DATA:  Chronic edema of bilateral lower extremities. EXAM: BILATERAL LOWER EXTREMITY VENOUS DOPPLER ULTRASOUND TECHNIQUE: Gray-scale sonography with graded compression, as well as color Doppler and duplex ultrasound were performed to evaluate the lower extremity deep venous systems from the level of the common femoral vein and including the common femoral, femoral, profunda femoral, popliteal and calf veins including the posterior tibial, peroneal and gastrocnemius veins when visible. The superficial great saphenous vein was also interrogated. Spectral Doppler was utilized to evaluate flow at rest and with distal augmentation maneuvers in the common femoral, femoral and popliteal veins. COMPARISON:  01/12/2017 FINDINGS: RIGHT LOWER EXTREMITY Common Femoral Vein: No evidence of thrombus. Normal compressibility, respiratory phasicity and response to augmentation. Saphenofemoral Junction: No evidence of thrombus. Normal compressibility and flow on color Doppler imaging. Profunda Femoral Vein: No evidence of thrombus. Normal compressibility and flow on color Doppler imaging. Femoral Vein: No evidence of thrombus. Normal compressibility, respiratory phasicity and response to augmentation. Popliteal Vein: No evidence of thrombus. Normal compressibility, respiratory phasicity and response to augmentation. Calf Veins: No evidence of thrombus. Normal compressibility and flow on color Doppler imaging. Superficial Great Saphenous Vein: No evidence of thrombus. Normal compressibility. Venous Reflux:  None. Other Findings: No evidence of superficial thrombophlebitis or abnormal fluid collection. LEFT LOWER EXTREMITY Common Femoral Vein: No evidence of thrombus. Normal compressibility, respiratory  phasicity and response to augmentation. Saphenofemoral Junction: No evidence of thrombus. Normal compressibility and flow on color Doppler imaging. Profunda Femoral Vein: No evidence of thrombus. Normal compressibility and flow on color Doppler imaging. Femoral Vein: No evidence of thrombus. Normal compressibility, respiratory phasicity and response to augmentation. Popliteal Vein: No evidence of thrombus. Normal compressibility, respiratory phasicity and response to augmentation. Calf Veins: No evidence of thrombus. Normal compressibility and flow on color Doppler imaging. Superficial Great Saphenous Vein: No evidence of thrombus. Normal compressibility. Venous Reflux:  None. Other Findings: No evidence of superficial thrombophlebitis or abnormal fluid collection. IMPRESSION: No evidence of bilateral lower extremity deep venous thrombosis. Electronically Signed   By: Aletta Edouard M.D.   On: 03/01/2018 14:06    EKG:   Orders placed or performed during the hospital encounter of 02/28/18  . ED EKG within 10 minutes  . ED EKG within 10 minutes  . EKG 12-Lead  . EKG 12-Lead  . EKG 12-Lead  . EKG 12-Lead    ASSESSMENT AND PLAN:   77 year old male with past medical history significant for CHF, CKD stage III, hypertension and A. fib presents to hospital secondary to worsening shortness of breath and lower extremity swelling.  1.  Acute on chronic diastolic CHF exacerbation-admitted, IV Lasix was given. -Which caused worsening of his BUN and creatinine.  The Lasix has been on hold. -Chronic left leg edema, likely from venous insufficiency. -Off supplemental oxygen.  Doppler of the legs is negative for any DVT.  2.  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  Discussed with cardiology, -  Because of his decrease in LV cavity size from the hypertrophy, they have recommended that the patient can be on the wet side rather than  dry side. -Patient does have orthostatic tachycardia.  Will give gentle hydration x 1 day.  Discontinue diuretics. - on metoprolol and added cardizem  3.  Acute renal failure on CKD stage III-Baseline creatinine seems to be at 1.7   -  discontinued lasix, gentle hydration, renal US -We will function back to baseline  4.  Hypertension-continue metoprolol and added cardizem  5.  Atrial fibrillation-known history of chronic A. fib.  Heart rate has been elevated. -IV fluids will be discontinued today.  Cardiology has been consulted -Started on Cardizem as well.  Patient already on metoprolol -Cardiology to discuss anticoagulation  6.  DVT prophylaxis-Lovenox -However platelets have been steadily dropping.  Continue to monitor for now  7.  Acute cystitis-urine analysis positive for infection.  Urine cultures ordered -on Rocephin-treat for 7 days  Physical therapy recommended rehab.  However reevaluate today to see if patient improves-  Updated his wife at bedside   All the records are reviewed and case discussed with Care Management/Social Workerr. Management plans discussed with the patient, family and they are in agreement.  CODE STATUS: Full code  TOTAL TIME TAKING CARE OF THIS PATIENT: 36 minutes.   POSSIBLE D/C IN 1-2 to DAYS, DEPENDING ON CLINICAL CONDITION.   Gladstone Lighter M.D on 03/03/2018 at 11:25 AM  Between 7am to 6pm - Pager - [phone]  After 6pm go to [email or web address] - password [email or web address] [facility]  Office  [phone]  CC: Primary care physician; Denton Lank, MD

## 2018-03-03 NOTE — Progress Notes (Signed)
*  PRELIMINARY RESULTS* Echocardiogram 2D Echocardiogram has been performed.  George Bolton 03/03/2018, 2:35 PM

## 2018-03-03 NOTE — Care Management Important Message (Signed)
Copy of signed IM left in patient's room.    

## 2018-03-04 ENCOUNTER — Encounter: Payer: Self-pay | Admitting: Nurse Practitioner

## 2018-03-04 DIAGNOSIS — Z7189 Other specified counseling: Secondary | ICD-10-CM

## 2018-03-04 DIAGNOSIS — I482 Chronic atrial fibrillation: Secondary | ICD-10-CM

## 2018-03-04 DIAGNOSIS — R609 Edema, unspecified: Secondary | ICD-10-CM

## 2018-03-04 LAB — CBC
HCT: 39.2 % — ABNORMAL LOW (ref 40.0–52.0)
Hemoglobin: 13.4 g/dL (ref 13.0–18.0)
MCH: 29 pg (ref 26.0–34.0)
MCHC: 34.2 g/dL (ref 32.0–36.0)
MCV: 84.9 fL (ref 80.0–100.0)
Platelets: 96 10*3/uL — ABNORMAL LOW (ref 150–440)
RBC: 4.62 MIL/uL (ref 4.40–5.90)
RDW: 16.6 % — ABNORMAL HIGH (ref 11.5–14.5)
WBC: 8.4 10*3/uL (ref 3.8–10.6)

## 2018-03-04 LAB — URINE CULTURE: Culture: >=100000 — AB

## 2018-03-04 LAB — BASIC METABOLIC PANEL
Anion gap: 5 (ref 5–15)
BUN: 27 mg/dL — ABNORMAL HIGH (ref 6–20)
CO2: 23 mmol/L (ref 22–32)
Calcium: 8.8 mg/dL — ABNORMAL LOW (ref 8.9–10.3)
Chloride: 108 mmol/L (ref 101–111)
Creatinine, Ser: 1.6 mg/dL — ABNORMAL HIGH (ref 0.61–1.24)
GFR calc Af Amer: 46 mL/min — ABNORMAL LOW (ref >60)
GFR calc non Af Amer: 40 mL/min — ABNORMAL LOW (ref >60)
Glucose, Bld: 128 mg/dL — ABNORMAL HIGH (ref 65–99)
Potassium: 4.3 mmol/L (ref 3.5–5.1)
Sodium: 136 mmol/L (ref 135–145)

## 2018-03-04 MED ORDER — HYDROCOD POLST-CPM POLST ER 10-8 MG/5ML PO SUER
5.0000 mL | Freq: Two times a day (BID) | ORAL | Status: DC
  Administered 2018-03-04: 5 mL via ORAL
  Filled 2018-03-04: qty 5

## 2018-03-04 MED ORDER — ENSURE ENLIVE PO LIQD: 237.0000 mL | Freq: Two times a day (BID) | ORAL | 12 refills | Status: DC

## 2018-03-04 MED ORDER — CEPHALEXIN 500 MG PO CAPS: 500.0000 mg | ORAL_CAPSULE | Freq: Two times a day (BID) | ORAL | 0 refills | Status: AC

## 2018-03-04 MED ORDER — DILTIAZEM HCL ER COATED BEADS 180 MG PO CP24: 180.0000 mg | ORAL_CAPSULE | Freq: Every day | ORAL | 2 refills | Status: DC

## 2018-03-04 MED ORDER — BENZONATATE 200 MG PO CAPS: 200.0000 mg | ORAL_CAPSULE | Freq: Three times a day (TID) | ORAL | 0 refills | Status: AC

## 2018-03-04 MED ORDER — FUROSEMIDE 20 MG PO TABS
20.0000 mg | ORAL_TABLET | Freq: Every day | ORAL | Status: DC
  Administered 2018-03-04: 20 mg via ORAL
  Filled 2018-03-04: qty 1

## 2018-03-04 MED ORDER — FUROSEMIDE 20 MG PO TABS: 20.0000 mg | ORAL_TABLET | Freq: Every day | ORAL | 1 refills | Status: DC

## 2018-03-04 MED ORDER — METOPROLOL TARTRATE 50 MG PO TABS: 50.0000 mg | ORAL_TABLET | Freq: Two times a day (BID) | ORAL | 2 refills | Status: DC

## 2018-03-04 NOTE — Progress Notes (Signed)
Progress Note  Patient Name: George Bolton Date of Encounter: 03/04/2018  Primary Cardiologist: Ida Rogue, MD  Subjective   Had some shortness of breath while lying in bed last night.  Resolved after he sat up. No recurrence.  No chest pain, palpitations.  Rates 80's to 90.  Inpatient Medications    Scheduled Meds: . atorvastatin  40 mg Oral Daily  . chlorpheniramine-HYDROcodone  5 mL Oral Q12H  . diltiazem  180 mg Oral Daily  . enoxaparin (LOVENOX) injection  40 mg Subcutaneous Q24H  . feeding supplement (ENSURE ENLIVE)  237 mL Oral BID BM  . furosemide  20 mg Oral Daily  . metoprolol tartrate  50 mg Oral BID  . sodium chloride flush  3 mL Intravenous Q12H   Continuous Infusions: . cefTRIAXone (ROCEPHIN) IVPB 1 gram/100 mL NS (Mini-Bag Plus) 1 g (03/04/18 0743)   PRN Meds: acetaminophen **OR** acetaminophen, albuterol, ondansetron **OR** ondansetron (ZOFRAN) IV, polyethylene glycol   Vital Signs    Vitals:   03/04/18 0301 03/04/18 0500 03/04/18 0600 03/04/18 0801  BP: 139/88   (!) 129/96  Pulse: 91   100  Resp:      Temp:    97.7 F (36.5 C)  TempSrc:    Oral  SpO2: 99%   100%  Weight:  201 lb 8 oz (91.4 kg) 200 lb (90.7 kg)   Height:        Intake/Output Summary (Last 24 hours) at 03/04/2018 0813 Last data filed at 03/04/2018 9983 Gross per 24 hour  Intake 340 ml  Output 600 ml  Net -260 ml   Filed Weights   03/03/18 0500 03/04/18 0500 03/04/18 0600  Weight: 198 lb 11.2 oz (90.1 kg) 201 lb 8 oz (91.4 kg) 200 lb (90.7 kg)    Physical Exam   GEN: Well nourished, well developed, in no acute distress.  HEENT: Grossly normal.  Neck: Supple, no JVD, carotid bruits, or masses. Cardiac: IR, IR, no murmurs, rubs, or gallops. No clubbing, cyanosis, trace left malleolar edema.  Radials/DP/PT 1+ and equal bilaterally.  Respiratory:  Respirations regular and unlabored, clear to auscultation bilaterally. GI: Obese, soft, nontender, nondistended, BS + x  4. MS: no deformity or atrophy. Skin: warm and dry, no rash. Neuro:  Strength and sensation are intact. Psych: AAOx3.  Normal affect.  Labs    Chemistry Recent Labs  Lab 03/02/18 0319 03/03/18 0448 03/04/18 0424  NA 136 134* 136  K 3.9 3.5 4.3  CL 107 105 108  CO2 20* 21* 23  GLUCOSE 114* 138* 128*  BUN 33* 36* 27*  CREATININE 2.09* 1.68* 1.60*  CALCIUM 8.7* 8.4* 8.8*  GFRNONAA 29* 37* 40*  GFRAA 33* 43* 46*  ANIONGAP 9 8 5      Hematology Recent Labs  Lab 03/02/18 0319 03/03/18 0448 03/04/18 0424  WBC 12.3* 9.5 8.4  RBC 5.02 4.98 4.62  HGB 14.2 14.2 13.4  HCT 42.6 42.4 39.2*  MCV 84.9 85.0 84.9  MCH 28.3 28.5 29.0  MCHC 33.3 33.5 34.2  RDW 16.4* 16.7* 16.6*  PLT 92* 86* 96*    Cardiac Enzymes Recent Labs  Lab 02/28/18 1338  TROPONINI 0.04*      Radiology    US Renal  Result Date: 03/02/2018 CLINICAL DATA:  77 year old male with acute renal failure. Initial encounter. EXAM: RENAL / URINARY TRACT ULTRASOUND COMPLETE COMPARISON:  01/11/2017 CT. FINDINGS: Right Kidney: Length: 9.3 cm. Lobulated contour. No hydronephrosis. Several renal cysts largest measuring 4 cm and  2.4 cm. Upper pole cyst with tiny associated calcification. CT detected lower pole cysts not as well delineated on present ultrasound. Left Kidney: Length: 13.1 cm. No hydronephrosis. At least 3 cysts largest measuring 6.3 cm and 5.6 cm. Bladder: Enlarged prostate gland impresses upon the bladder base. IMPRESSION: No hydronephrosis. Right kidney smaller than the left. Multiple bilateral renal cysts largest on the left measures up to 6.3 cm. Enlarged prostate gland impresses upon the bladder base. Clinical and laboratory correlation recommended to evaluate prostate gland. Electronically Signed   By: Genia Del M.D.   On: 03/02/2018 10:21    Telemetry    Afib, pvc's, 80's to 90 - Personally Reviewed  Cardiac Studies   2D Echocardiogram 5.7.2019  Study Conclusions   - Left ventricle: The  cavity size was normal. Wall thickness was   increased in a pattern of moderate LVH. There is near complete   obliteration of the mid and apical LV cavity, which can be seen   with apical hypertrophic cardiomyopathy. Systolic function was   hyperdynamic. The estimated ejection fraction was in the range of   70% to 75%. - Left atrium: The atrium was mildly dilated. - Right ventricle: The cavity size was normal. Wall thickness was   normal. Systolic function was mildly reduced. - Right atrium: The atrium was mildly dilated. _____________  Limited 2D Echocardiogram w/ definity  Study Conclusions   - Left ventricle: The cavity size was normal. There was mild to   moderate concentric hypertrophy. No significant apical   hypertrophy noted with definity. Systolic function was normal.   The estimated ejection fraction was in the range of 60% to 65%.   Wall motion was normal; there were no regional wall motion   abnormalities. The study is not technically sufficient to allow   evaluation of LV diastolic function.   Impressions:   - No apical hypertrophic cardiomyopathy noted.   Patient Profile     77 y.o. male with a hx of chronic Afib not on full dose anticoagulation, CKD stage III, lower extremity swelling, HTN, and gout, who was admitted 5/6 with progressive dyspnea, edema, and difficult to control heart rates.  Assessment & Plan    1.  Permanent Afib: Rates improved on  blocker/dilt - 80's to 90. CHA2DS2VASc = 4.  He is now considering eliquis - his wife is on it and would like for him to start.  2.  Acute diastolic CHF: Became dyspneic with lying down last night.  Volume looks good this AM. Minus 1.2L for admission. Wt recorded as low as 193 on 5/7 but now listed @ 200.  Obtain standing wt if possible.  Ambulate.  Was on lasix 20 prn @ home previously.  Cont 20 daily.  HR/BP stable.  3.  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 Creat back to baseline.  Cont current dose of lasix.  4.  Elevated Troponin: No chest pain.  Likely represents demand ischemia in the setting of volume excess and elevated HR's.  Echo w/ nl EF, no rwma.  Could consider outpt noninvasive ischemic eval. Cont  blocker.  Will add ASA if not wanting eliquis.  5.  UTI:  abx per IM.  6.  Thrombocytopenia:  Stable.  Signed, Murray Hodgkins, NP  03/04/2018, 8:13 AM    For questions or updates, please contact   Please consult [email or web address] for contact info under Cardiology/STEMI.

## 2018-03-04 NOTE — Care Management (Signed)
patient for discharge home today and agreeable to home health nurse and therapy. Agency preference is 'who gave it to me the last time."  Agency is [facility].  Called referral to [facility].  No DME needs

## 2018-03-04 NOTE — Discharge Summary (Signed)
[facility] [place] NAME: George Bolton    MR#:  [id number]  DATE OF BIRTH:  02/04/1941  DATE OF ADMISSION:  02/28/2018   ADMITTING PHYSICIAN: Hillary Bow, MD  DATE OF DISCHARGE:  03/04/18  PRIMARY CARE PHYSICIAN: Denton Lank, MD   ADMISSION DIAGNOSIS:   Respiratory distress [R06.03] Acute on chronic congestive heart failure, unspecified heart failure type ([place]) [I50.9]  DISCHARGE DIAGNOSIS:   Active Problems:   CHF (congestive heart failure) ([place])   SECONDARY DIAGNOSIS:   Past Medical History:  Diagnosis Date  . Chronic atrial fibrillation ([place])    a. Afib dates back to at least 2002 when reviewing prior EKG with EKGs from 2014 onward showing Afib; b. CHADS2VASc at least 4 (CHF, HTN, age x 2)  . Chronic diastolic CHF (congestive heart failure) ([place])    a. TTE 5/19: EF 70-75%, mod LVH, near complete obliteration of the mid and apical LV cavity (can be seen in apical hypertrophic CM), mildly dilated LA, RV cavity size nl, RV wall thickness nl, RVSF mildly reduced, mildly dilated RA; b. 02/2018 Limited echo w/ definity: EF 50-65%, no apical hypertrophic CM.  Marland Kitchen Gout   . Hypertension     HOSPITAL COURSE:   77 year old male with past medical history significant for CHF, CKD stage III, hypertension and A. fib presents to hospital secondary to worsening shortness of breath and lower extremity swelling.  1.  Acute on chronic diastolic CHF exacerbation-admitted, IV Lasix was given. -Which caused worsening of his BUN and creatinine.  -Improved now.  Will be discharged on low-dose Lasix. -Chronic left leg edema, likely from venous insufficiency. -Off supplemental oxygen.  Doppler of the legs is negative for any DVT.  2.  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  Discussed with cardiology, recommended outpatient follow-up.  -Because of his decrease in LV  cavity size from the hypertrophy, they have recommended that the patient should be careful with overdiuresis - on metoprolol and added cardizem  3.  Acute renal failure on CKD stage III-Baseline creatinine seems to be at 1.7   - renal ultrasound with no obstruction  4.  Hypertension-continue metoprolol, lasix and cardizem  5. Chronic afib- appreciate cardiology, rate well controlled - on metoprolol and cardizem - on asa, refused eliquis for now  6.  Acute cystitis-urine analysis positive for infection.  Urine cultures ordered -on Rocephin- discharge on keflex  Being discharged with home health   DISCHARGE CONDITIONS:   Guarded  CONSULTS OBTAINED:   Treatment Team:  Corey Skains, MD Minna Merritts, MD  DRUG ALLERGIES:   Allergies  Allergen Reactions  . Cortisone    DISCHARGE MEDICATIONS:   Allergies as of 03/04/2018      Reactions   Cortisone       Medication List    STOP taking these medications   felodipine 10 MG 24 hr tablet Commonly known as:  PLENDIL   hydrALAZINE 50 MG tablet Commonly known as:  APRESOLINE     TAKE these medications   aspirin 325 MG tablet Take 325 mg by mouth daily.   atorvastatin 40 MG tablet Commonly known as:  LIPITOR Take 1 tablet by mouth daily.   benzonatate 200 MG capsule Commonly known as:  TESSALON PERLES Take 1 capsule (200 mg total) by mouth 3 (three) times daily for 5 days.   cephALEXin 500 MG capsule Commonly known as:  KEFLEX Take 1 capsule (  500 mg total) by mouth 2 (two) times daily for 5 days.   diltiazem 180 MG 24 hr capsule Commonly known as:  CARDIZEM CD Take 1 capsule (180 mg total) by mouth daily.   feeding supplement (ENSURE ENLIVE) Liqd Take 237 mLs by mouth 2 (two) times daily between meals.   furosemide 20 MG tablet Commonly known as:  LASIX Take 1 tablet (20 mg total) by mouth daily. What changed:    medication strength  when to take this  reasons to take this   metoprolol  tartrate 50 MG tablet Commonly known as:  LOPRESSOR Take 1 tablet (50 mg total) by mouth 2 (two) times daily.        DISCHARGE INSTRUCTIONS:   1. PCP f/u in 1-2 weeks 2, Cardiology f/u in 2 weeks  DIET:   Cardiac diet  ACTIVITY:   Activity as tolerated  OXYGEN:   Home Oxygen: No.  Oxygen Delivery: room air  DISCHARGE LOCATION:   home   If you experience worsening of your admission symptoms, develop shortness of breath, life threatening emergency, suicidal or homicidal thoughts you must seek medical attention immediately by calling 911 or calling your MD immediately  if symptoms less severe.  You Must read complete instructions/literature along with all the possible adverse reactions/side effects for all the Medicines you take and that have been prescribed to you. Take any new Medicines after you have completely understood and accpet all the possible adverse reactions/side effects.   Please note  You were cared for by a hospitalist during your hospital stay. If you have any questions about your discharge medications or the care you received while you were in the hospital after you are discharged, you can call the unit and asked to speak with the hospitalist on call if the hospitalist that took care of you is not available. Once you are discharged, your primary care physician will handle any further medical issues. Please note that NO REFILLS for any discharge medications will be authorized once you are discharged, as it is imperative that you return to your primary care physician (or establish a relationship with a primary care physician if you do not have one) for your aftercare needs so that they can reassess your need for medications and monitor your lab values.    On the day of Discharge:  VITAL SIGNS:   Blood pressure (!) 129/96, pulse 100, temperature 97.7 F (36.5 C), temperature source Oral, resp. rate 20, height 6' (1.829 m), weight 90.7 kg (200 lb), SpO2 100  %.  PHYSICAL EXAMINATION:   GENERAL:  77 y.o.-year-old patient lying in the bed with no acute distress.  EYES: Pupils equal, round, reactive to light and accommodation. No scleral icterus. Extraocular muscles intact.  HEENT: Head atraumatic, normocephalic. Oropharynx and nasopharynx clear.  NECK:  Supple, no jugular venous distention. No thyroid enlargement, no tenderness.  LUNGS: Normal breath sounds bilaterally, no wheezing, rales,rhonchi or crepitation. No use of accessory muscles of respiration.  Decreased bibasilar breath sounds noted CARDIOVASCULAR: S1, S2 normal. No murmurs, rubs, or gallops.  ABDOMEN: Soft, nontender, nondistended. Bowel sounds present. No organomegaly or mass.  EXTREMITIES: No cyanosis, or clubbing.  Left leg has 1+ edema compared to the right leg NEUROLOGIC: Cranial nerves II through XII are intact. Muscle strength 5/5 in all extremities. Sensation intact. Gait not checked.  Global weakness present PSYCHIATRIC: The patient is alert and oriented x 3.  SKIN: No obvious rash, lesion, or ulcer.  DATA REVIEW:   CBC  Recent Labs  Lab 03/04/18 0424  WBC 8.4  HGB 13.4  HCT 39.2*  PLT 96*    Chemistries  Recent Labs  Lab 03/03/18 0448 03/04/18 0424  NA 134* 136  K 3.5 4.3  CL 105 108  CO2 21* 23  GLUCOSE 138* 128*  BUN 36* 27*  CREATININE 1.68* 1.60*  CALCIUM 8.4* 8.8*  MG 2.0  --      Microbiology Results  Results for orders placed or performed during the hospital encounter of 02/28/18  Urine Culture     Status: Abnormal   Collection Time: 03/01/18  5:15 PM  Result Value Ref Range Status   Specimen Description   Final    URINE, RANDOM Performed at [facility], [address] [place], [place] [address]    Special Requests   Final    NONE Performed at [facility], [address] [place], [place] [address]    Culture >=100,000 COLONIES/mL KLEBSIELLA PNEUMONIAE (A)  Final   Report Status 03/04/2018 FINAL  Final    Organism ID, Bacteria KLEBSIELLA PNEUMONIAE (A)  Final      Susceptibility   Klebsiella pneumoniae - MIC*    AMPICILLIN RESISTANT Resistant     CEFAZOLIN <=4 SENSITIVE Sensitive     CEFTRIAXONE <=1 SENSITIVE Sensitive     CIPROFLOXACIN <=0.25 SENSITIVE Sensitive     GENTAMICIN <=1 SENSITIVE Sensitive     IMIPENEM <=0.25 SENSITIVE Sensitive     NITROFURANTOIN 32 SENSITIVE Sensitive     TRIMETH/SULFA <=20 SENSITIVE Sensitive     AMPICILLIN/SULBACTAM 4 SENSITIVE Sensitive     PIP/TAZO <=4 SENSITIVE Sensitive     Extended ESBL NEGATIVE Sensitive     * >=100,000 COLONIES/mL KLEBSIELLA PNEUMONIAE    RADIOLOGY:  No results found.   Management plans discussed with the patient, family and they are in agreement.  CODE STATUS:     Code Status Orders  (From admission, onward)        Start     Ordered   02/28/18 1714  Full code  Continuous     02/28/18 1715    Code Status History    Date Active Date Inactive Code Status Order ID Comments User Context   01/11/2017 1752 01/15/2017 1901 Full Code [id number]  Fritzi Mandes, MD Inpatient      TOTAL TIME TAKING CARE OF THIS PATIENT: 38 minutes.    Gladstone Lighter M.D on 03/04/2018 at 2:36 PM  Between 7am to 6pm - Pager - [phone]  After 6pm go to [email or web address] - Proofreader  [employer]  Hospitalists  Office  [phone]  CC: Primary care physician; Denton Lank, MD   Note: This dictation was prepared with Dragon dictation along with smaller phrase technology. Any transcriptional errors that result from this process are unintentional.

## 2018-03-04 NOTE — Progress Notes (Signed)
Discharged to home with his wife.  Home care services set up by case management.

## 2018-03-04 NOTE — Plan of Care (Addendum)
Continues to report shortness of breath with exertion on room air. Oxygen saturations 90s.  HR 80-90s this shift.  Atrial fib continues.

## 2018-03-07 ENCOUNTER — Telehealth: Payer: Self-pay

## 2018-03-07 NOTE — Telephone Encounter (Signed)
Flagged on EMMI report for being unsure if taking meds and having other questions/problems.  Called and spoke with patient's wife, Corliss Skains, as patient was unavailable.  Wife reports patient is taking all medications as directed.  They did have a question as to when [facility] would be coming out for home health. Relayed number for [facility] [phone]).  Wife will call [facility] regarding setting up time for the nurse to come out.  No other questions or concerns at this time.  I thanked her for her time and informed her that they would receive once more automated call in the next few days as a final check in on Mr. Deckard.

## 2018-03-13 NOTE — Progress Notes (Signed)
Patient ID: George Bolton, male    DOB: [birth year], 77 y.o.   MRN: [id number]  HPI  Mr Rountree is a 77 y/o male with a history of HTN, atrial fibrillation, gout and chronic heart failure.   Echo report from 03/03/18 reviewed and showed an EF of 60-65%.   Admitted 02/28/18 due to acute on chronic HF. Cardiology consult obtained. Initially needed IV lasix and then transitioned to oral diuretics. Due to decrease in LV capacity, watch for overdiuresis. Discharged after 4 days.   He presents today for his initial visit with a chief complaint of minimal fatigue upon moderate exertion. He describes this as chronic in nature having been present for several years. He has associated pedal edema and difficulty sleeping along with this. He denies any abdominal distention, palpitations, chest pain, shortness of breath, cough, dizziness or weight gain. Has an appointment scheduled with the urologist due to testicular edema.   Past Medical History:  Diagnosis Date  . Chronic atrial fibrillation ([place])    a. Afib dates back to at least 2002 when reviewing prior EKG with EKGs from 2014 onward showing Afib; b. CHADS2VASc at least 4 (CHF, HTN, age x 2)  . Chronic diastolic CHF (congestive heart failure) ([place])    a. TTE 5/19: EF 70-75%, mod LVH, near complete obliteration of the mid and apical LV cavity (can be seen in apical hypertrophic CM), mildly dilated LA, RV cavity size nl, RV wall thickness nl, RVSF mildly reduced, mildly dilated RA; b. 02/2018 Limited echo w/ definity: EF 50-65%, no apical hypertrophic CM.  Marland Kitchen Gout   . Hypertension    Past Surgical History:  Procedure Laterality Date  . CHOLECYSTECTOMY N/A 01/13/2017   Procedure: LAPAROSCOPIC CHOLECYSTECTOMY;  Surgeon: Jules Husbands, MD;  Location: [facility] ORS;  Service: General;  Laterality: N/A;   Family History  Problem Relation Age of Onset  . Stroke Mother    Social History   Tobacco Use  . Smoking status: Never Smoker  . Smokeless tobacco: Never  Used  Substance Use Topics  . Alcohol use: No   Allergies  Allergen Reactions  . Cortisone    Prior to Admission medications   Medication Sig Start Date End Date Taking? Authorizing Provider  aspirin 325 MG tablet Take 325 mg by mouth daily.   Yes [provider]  atorvastatin (LIPITOR) 40 MG tablet Take 1 tablet by mouth daily.   Yes [provider]  diltiazem (CARDIZEM CD) 180 MG 24 hr capsule Take 1 capsule (180 mg total) by mouth daily. 03/04/18  Yes Gladstone Lighter, MD  feeding supplement, ENSURE ENLIVE, (ENSURE ENLIVE) LIQD Take 237 mLs by mouth 2 (two) times daily between meals. 03/04/18  Yes Gladstone Lighter, MD  furosemide (LASIX) 20 MG tablet Take 1 tablet (20 mg total) by mouth daily. Patient taking differently: Take 10 mg by mouth daily.  03/04/18  Yes Gladstone Lighter, MD  metoprolol tartrate (LOPRESSOR) 50 MG tablet Take 1 tablet (50 mg total) by mouth 2 (two) times daily. 03/04/18  Yes Gladstone Lighter, MD    Review of Systems  Constitutional: Positive for fatigue. Negative for appetite change.  HENT: Negative for congestion, postnasal drip and sore throat.   Eyes: Negative.   Respiratory: Negative for cough, chest tightness and shortness of breath.   Cardiovascular: Positive for leg swelling. Negative for chest pain and palpitations.  Gastrointestinal: Negative for abdominal distention and abdominal pain.  Endocrine: Negative.   Genitourinary: Negative.   Musculoskeletal: Negative for  back pain and neck pain.  Skin: Negative.   Allergic/Immunologic: Negative.   Neurological: Negative for dizziness and light-headedness.  Hematological: Negative for adenopathy. Does not bruise/bleed easily.  Psychiatric/Behavioral: Positive for sleep disturbance (waking up every 3-4 hours).   Vitals:   03/14/18 1352  BP: (!) 166/107  Pulse: 95  Resp: 18  SpO2: 99%  Weight: 198 lb 8 oz (90 kg)  Height: 6\' 1"  (1.854 m)   Wt Readings from Last 3  Encounters:  03/14/18 198 lb 8 oz (90 kg)  03/04/18 200 lb (90.7 kg)  05/15/17 202 lb (91.6 kg)   Lab Results  Component Value Date   CREATININE 1.60 (H) 03/04/2018   CREATININE 1.68 (H) 03/03/2018   CREATININE 2.09 (H) 03/02/2018    Physical Exam  Constitutional: He is oriented to person, place, and time. He appears well-developed and well-nourished.  HENT:  Head: Normocephalic and atraumatic.  Neck: Normal range of motion. Neck supple. No JVD present.  Cardiovascular: Normal rate. An irregular rhythm present.  Pulmonary/Chest: Effort normal. No respiratory distress. He has no wheezes. He has no rales.  Abdominal: Soft. He exhibits no distension.  Musculoskeletal: He exhibits edema (2+ pitting edema with L>R). He exhibits no tenderness.  Neurological: He is alert and oriented to person, place, and time.  Skin: Skin is warm and dry.  Psychiatric: He has a normal mood and affect. His behavior is normal.  Nursing note and vitals reviewed.   Assessment & Plan:  1: Chronic heart failure with preserved ejection fraction- - NYHA class II - mildly fluid overloaded with pedal edema - weighing daily and says that his weight has been stable. Instructed to call for an overnight weight gain of >2 pounds or a weekly weight gain of >5 pounds - not adding salt but has eaten some saltier foods like pork and potato salad from the deli. Reviewed the importance of keeping daily sodium intake to 2000mg  a day as well as how to read food labels. Written dietary information was given to him about this as well.  - BNP 01/11/17 was 530.0  2: HTN- - BP initially elevated (166/107) but had improved at the end of the visit (148/90) - follows with PCP [facility]y Pr[facility] at [facility]) regarding this - BMP 03/04/18 reviewed and showed sodium 136, potassium 4.3 and GFR 46  3: Atrial fibrillation- - currently rate controlled on diltiazem and lopressor - on aspirin alone at this time as patient has refused  eliquis - [facility] ([facility]) saw patient during recent admission  4: Lymphedema- - stage 2 - not elevating legs much and he was encouraged to elevate them when sitting for long periods of time - wearing support socks - encouraged him to increase his activity - should edema persist, could consider compression pumps  Patient did not bring his medications nor a list. Each medication was verbally reviewed with the patient and he was encouraged to bring the bottles to every visit to confirm accuracy of list.  Return in 1 month or sooner for any questions/problems before then.

## 2018-03-14 ENCOUNTER — Encounter: Payer: Self-pay | Admitting: Family

## 2018-03-14 ENCOUNTER — Ambulatory Visit: Payer: Medicare Other | Attending: Family | Admitting: Family

## 2018-03-14 VITALS — BP 148/90 | HR 95 | Resp 18 | Ht 73.0 in | Wt 198.5 lb

## 2018-03-14 DIAGNOSIS — Z9049 Acquired absence of other specified parts of digestive tract: Secondary | ICD-10-CM | POA: Insufficient documentation

## 2018-03-14 DIAGNOSIS — I10 Essential (primary) hypertension: Secondary | ICD-10-CM

## 2018-03-14 DIAGNOSIS — I890 Lymphedema, not elsewhere classified: Secondary | ICD-10-CM

## 2018-03-14 DIAGNOSIS — Z888 Allergy status to other drugs, medicaments and biological substances status: Secondary | ICD-10-CM | POA: Insufficient documentation

## 2018-03-14 DIAGNOSIS — I509 Heart failure, unspecified: Secondary | ICD-10-CM | POA: Diagnosis present

## 2018-03-14 DIAGNOSIS — I482 Chronic atrial fibrillation: Secondary | ICD-10-CM | POA: Insufficient documentation

## 2018-03-14 DIAGNOSIS — Z823 Family history of stroke: Secondary | ICD-10-CM | POA: Diagnosis not present

## 2018-03-14 DIAGNOSIS — Z7982 Long term (current) use of aspirin: Secondary | ICD-10-CM | POA: Diagnosis not present

## 2018-03-14 DIAGNOSIS — I5032 Chronic diastolic (congestive) heart failure: Secondary | ICD-10-CM | POA: Diagnosis not present

## 2018-03-14 DIAGNOSIS — M109 Gout, unspecified: Secondary | ICD-10-CM | POA: Diagnosis not present

## 2018-03-14 DIAGNOSIS — I110 Hypertensive heart disease with heart failure: Secondary | ICD-10-CM | POA: Diagnosis not present

## 2018-03-14 DIAGNOSIS — I480 Paroxysmal atrial fibrillation: Secondary | ICD-10-CM

## 2018-03-14 DIAGNOSIS — N448 Other noninflammatory disorders of the testis: Secondary | ICD-10-CM | POA: Insufficient documentation

## 2018-03-14 DIAGNOSIS — Z79899 Other long term (current) drug therapy: Secondary | ICD-10-CM | POA: Diagnosis not present

## 2018-03-14 NOTE — Patient Instructions (Addendum)
Continue weighing daily and call for an overnight weight gain of > 2 pounds or a weekly weight gain of >5 pounds.  Elevate legs when sitting for long periods of time

## 2018-03-15 DIAGNOSIS — I4891 Unspecified atrial fibrillation: Secondary | ICD-10-CM | POA: Insufficient documentation

## 2018-03-15 DIAGNOSIS — I890 Lymphedema, not elsewhere classified: Secondary | ICD-10-CM | POA: Insufficient documentation

## 2018-04-07 ENCOUNTER — Other Ambulatory Visit: Payer: Self-pay

## 2018-04-07 ENCOUNTER — Emergency Department
Admission: EM | Admit: 2018-04-07 | Discharge: 2018-04-07 | Disposition: A | Discharge status: Home / Self Care | Payer: Medicare Other | Attending: Emergency Medicine | Admitting: Emergency Medicine

## 2018-04-07 ENCOUNTER — Encounter: Payer: Self-pay | Admitting: Emergency Medicine

## 2018-04-07 DIAGNOSIS — Z9049 Acquired absence of other specified parts of digestive tract: Secondary | ICD-10-CM | POA: Diagnosis not present

## 2018-04-07 DIAGNOSIS — I5032 Chronic diastolic (congestive) heart failure: Secondary | ICD-10-CM | POA: Diagnosis not present

## 2018-04-07 DIAGNOSIS — N3000 Acute cystitis without hematuria: Secondary | ICD-10-CM | POA: Diagnosis not present

## 2018-04-07 DIAGNOSIS — Z79899 Other long term (current) drug therapy: Secondary | ICD-10-CM | POA: Diagnosis not present

## 2018-04-07 DIAGNOSIS — Z7982 Long term (current) use of aspirin: Secondary | ICD-10-CM | POA: Insufficient documentation

## 2018-04-07 DIAGNOSIS — R32 Unspecified urinary incontinence: Secondary | ICD-10-CM | POA: Diagnosis present

## 2018-04-07 DIAGNOSIS — I110 Hypertensive heart disease with heart failure: Secondary | ICD-10-CM | POA: Insufficient documentation

## 2018-04-07 LAB — CBC WITH DIFFERENTIAL/PLATELET
Basophils Absolute: 0.1 10*3/uL (ref 0–0.1)
Basophils Relative: 0 %
Eosinophils Absolute: 0.1 10*3/uL (ref 0–0.7)
Eosinophils Relative: 1 %
HCT: 44.4 % (ref 40.0–52.0)
Hemoglobin: 14.9 g/dL (ref 13.0–18.0)
Lymphocytes Relative: 8 %
Lymphs Abs: 1.2 10*3/uL (ref 1.0–3.6)
MCH: 28.8 pg (ref 26.0–34.0)
MCHC: 33.6 g/dL (ref 32.0–36.0)
MCV: 85.5 fL (ref 80.0–100.0)
Monocytes Absolute: 1.4 10*3/uL — ABNORMAL HIGH (ref 0.2–1.0)
Monocytes Relative: 10 %
Neutro Abs: 11.5 10*3/uL — ABNORMAL HIGH (ref 1.4–6.5)
Neutrophils Relative %: 81 %
Platelets: 195 10*3/uL (ref 150–440)
RBC: 5.2 MIL/uL (ref 4.40–5.90)
RDW: 16.7 % — ABNORMAL HIGH (ref 11.5–14.5)
WBC: 14.2 10*3/uL — ABNORMAL HIGH (ref 3.8–10.6)

## 2018-04-07 LAB — COMPREHENSIVE METABOLIC PANEL
ALT: 15 U/L — ABNORMAL LOW (ref 17–63)
AST: 22 U/L (ref 15–41)
Albumin: 3.7 g/dL (ref 3.5–5.0)
Alkaline Phosphatase: 69 U/L (ref 38–126)
Anion gap: 11 (ref 5–15)
BUN: 34 mg/dL — ABNORMAL HIGH (ref 6–20)
CO2: 19 mmol/L — ABNORMAL LOW (ref 22–32)
Calcium: 9.5 mg/dL (ref 8.9–10.3)
Chloride: 108 mmol/L (ref 101–111)
Creatinine, Ser: 1.47 mg/dL — ABNORMAL HIGH (ref 0.61–1.24)
GFR calc Af Amer: 51 mL/min — ABNORMAL LOW (ref >60)
GFR calc non Af Amer: 44 mL/min — ABNORMAL LOW (ref >60)
Glucose, Bld: 135 mg/dL — ABNORMAL HIGH (ref 65–99)
Potassium: 3.9 mmol/L (ref 3.5–5.1)
Sodium: 138 mmol/L (ref 135–145)
Total Bilirubin: 0.7 mg/dL (ref 0.3–1.2)
Total Protein: 7.9 g/dL (ref 6.5–8.1)

## 2018-04-07 LAB — URINALYSIS, COMPLETE (UACMP) WITH MICROSCOPIC
Bacteria, UA: NONE SEEN
Bilirubin Urine: NEGATIVE
Glucose, UA: NEGATIVE mg/dL
Ketones, ur: NEGATIVE mg/dL
Nitrite: POSITIVE — AB
Protein, ur: 100 mg/dL — AB
Specific Gravity, Urine: 1.012 (ref 1.005–1.030)
WBC, UA: >50 WBC/hpf — ABNORMAL HIGH (ref 0–5)
pH: 5 (ref 5.0–8.0)

## 2018-04-07 MED ORDER — CEPHALEXIN 500 MG PO CAPS: 500.0000 mg | ORAL_CAPSULE | Freq: Three times a day (TID) | ORAL | 0 refills | Status: AC

## 2018-04-07 MED ORDER — LIDOCAINE HCL (PF) 1 % IJ SOLN
INTRAMUSCULAR | Status: AC
  Filled 2018-04-07: qty 5

## 2018-04-07 MED ORDER — CEFTRIAXONE SODIUM 1 G IJ SOLR
INTRAMUSCULAR | Status: AC
  Administered 2018-04-07: 1 g via INTRAMUSCULAR
  Filled 2018-04-07: qty 10

## 2018-04-07 MED ORDER — CEFTRIAXONE SODIUM 1 G IJ SOLR
1.0000 g | Freq: Once | INTRAMUSCULAR | Status: AC
  Administered 2018-04-07: 1 g via INTRAMUSCULAR

## 2018-04-07 NOTE — Discharge Instructions (Addendum)

## 2018-04-07 NOTE — ED Notes (Signed)
Pt c/o increased urinary incontinence over the past 2 weeks . States during the day he is only urinating about 2 tablespoons but then has an accident, states he has intermittent burning with urination. States he has been having to use sanitary pull ups.. Denies any pain/pressure or the urge to urinate

## 2018-04-07 NOTE — ED Provider Notes (Signed)
[facility] Emergency Department Provider Note  ____________________________________________  Time seen: Approximately 4:57 PM  I have reviewed the triage vital signs and the nursing notes.   HISTORY  Chief Complaint No chief complaint on file.  Level 5 caveat:  Portions of the history and physical were unable to be obtained due to poor historian   HPI George Bolton is a 77 y.o. male with history as listed below who presents for evaluation of urinary incontinence.  Patient reports few weeks of progressively worsening urinary incontinence.  Has been wearing pull-ups which is new for him.  He reports that he will have no feeling that he needs to urinate and all the sudden become incontinent.  Is concerned that he might have a UTI.  He is also complaining of mild dysuria.  No abdominal pain, no fever or chills, no nausea or vomiting.  Patient has no back pain, no back trauma, no bowel incontinence, no saddle anesthesia.  Patient reports swelling of his bilateral scrotum however that has been a chronic issue for 5 years and unchanged.  He has no redness or pain in his scrotum.  Patient is very poor historian.  Patient has an appointment with urology in 2 weeks however according to his wife, he was requesting to come to the emergency room today for evaluation of the urinary incontinence.  Past Medical History:  Diagnosis Date  . Chronic atrial fibrillation ([place])    a. Afib dates back to at least 2002 when reviewing prior EKG with EKGs from 2014 onward showing Afib; b. CHADS2VASc at least 4 (CHF, HTN, age x 2)  . Chronic diastolic CHF (congestive heart failure) ([place])    a. TTE 5/19: EF 70-75%, mod LVH, near complete obliteration of the mid and apical LV cavity (can be seen in apical hypertrophic CM), mildly dilated LA, RV cavity size nl, RV wall thickness nl, RVSF mildly reduced, mildly dilated RA; b. 02/2018 Limited echo w/ definity: EF 50-65%, no apical hypertrophic  CM.  Marland Kitchen Gout   . Hypertension     Patient Active Problem List   Diagnosis Date Noted  . Atrial fibrillation ([place]) 03/15/2018  . Lymphedema 03/15/2018  . Gout 01/25/2017  . Cholecystitis   . CHF (congestive heart failure) ([place]) 04/25/2015  . Hypertension 04/25/2015    Past Surgical History:  Procedure Laterality Date  . CHOLECYSTECTOMY N/A 01/13/2017   Procedure: LAPAROSCOPIC CHOLECYSTECTOMY;  Surgeon: Jules Husbands, MD;  Location: [facility] ORS;  Service: General;  Laterality: N/A;    Prior to Admission medications   Medication Sig Start Date End Date Taking? Authorizing Provider  aspirin 325 MG tablet Take 325 mg by mouth daily.    [provider]  atorvastatin (LIPITOR) 40 MG tablet Take 1 tablet by mouth daily.    [provider]  cephALEXin (KEFLEX) 500 MG capsule Take 1 capsule (500 mg total) by mouth 3 (three) times daily for 7 days. 04/07/18 04/14/18  Rudene Re, MD  diltiazem (CARDIZEM CD) 180 MG 24 hr capsule Take 1 capsule (180 mg total) by mouth daily. 03/04/18   Gladstone Lighter, MD  feeding supplement, ENSURE ENLIVE, (ENSURE ENLIVE) LIQD Take 237 mLs by mouth 2 (two) times daily between meals. 03/04/18   Gladstone Lighter, MD  furosemide (LASIX) 20 MG tablet Take 1 tablet (20 mg total) by mouth daily. Patient taking differently: Take 10 mg by mouth daily.  03/04/18   Gladstone Lighter, MD  metoprolol tartrate (LOPRESSOR) 50 MG tablet Take 1  tablet (50 mg total) by mouth 2 (two) times daily. 03/04/18   Gladstone Lighter, MD    Allergies Cortisone  Family History  Problem Relation Age of Onset  . Stroke Mother     Social History Social History   Tobacco Use  . Smoking status: Never Smoker  . Smokeless tobacco: Never Used  Substance Use Topics  . Alcohol use: No  . Drug use: No    Review of Systems  Constitutional: Negative for fever. Eyes: Negative for visual changes. ENT: Negative for sore throat. Neck: No neck pain    Cardiovascular: Negative for chest pain. Respiratory: Negative for shortness of breath. Gastrointestinal: Negative for abdominal pain, vomiting or diarrhea. Genitourinary: + dysuria and urinary incontinence Musculoskeletal: Negative for back pain. Skin: Negative for rash. Neurological: Negative for headaches, weakness or numbness. Psych: No SI or HI  ____________________________________________   PHYSICAL EXAM:  VITAL SIGNS: ED Triage Vitals [04/07/18 1557]  Enc Vitals Group     BP 122/88     Pulse Rate 96     Resp 16     Temp 98.6 F (37 C)     Temp Source Oral     SpO2 98 %     Weight      Height      Head Circumference      Peak Flow      Pain Score      Pain Loc      Pain Edu?      Excl. in [place]?     Constitutional: Alert and oriented. Well appearing and in no apparent distress. HEENT:      Head: Normocephalic and atraumatic.         Eyes: Conjunctivae are normal. Sclera is non-icteric.       Mouth/Throat: Mucous membranes are moist.       Neck: Supple with no signs of meningismus. Cardiovascular: Regular rate and rhythm. No murmurs, gallops, or rubs. 2+ symmetrical distal pulses are present in all extremities. No JVD. Respiratory: Normal respiratory effort. Lungs are clear to auscultation bilaterally. No wheezes, crackles, or rhonchi.  Gastrointestinal: Soft, non tender, and non distended with positive bowel sounds. No rebound or guarding. Genitourinary: No CVA tenderness, no bladder distention Musculoskeletal: Nontender with normal range of motion in all extremities. No edema, cyanosis, or erythema of extremities. Neurologic: Normal speech and language. Face is symmetric. Moving all extremities. No gross focal neurologic deficits are appreciated. Skin: Skin is warm, dry and intact. No rash noted. Psychiatric: Mood and affect are normal. Speech and behavior are normal.  ____________________________________________   LABS (all labs ordered are listed, but only  abnormal results are displayed)  Labs Reviewed  COMPREHENSIVE METABOLIC PANEL - Abnormal; Notable for the following components:      Result Value   CO2 19 (*)    Glucose, Bld 135 (*)    BUN 34 (*)    Creatinine, Ser 1.47 (*)    ALT 15 (*)    GFR calc non Af Amer 44 (*)    GFR calc Af Amer 51 (*)    All other components within normal limits  CBC WITH DIFFERENTIAL/PLATELET - Abnormal; Notable for the following components:   WBC 14.2 (*)    RDW 16.7 (*)    Neutro Abs 11.5 (*)    Monocytes Absolute 1.4 (*)    All other components within normal limits  URINALYSIS, COMPLETE (UACMP) WITH MICROSCOPIC - Abnormal; Notable for the following components:   Color, Urine YELLOW (*)  APPearance CLOUDY (*)    Hgb urine dipstick MODERATE (*)    Protein, ur 100 (*)    Nitrite POSITIVE (*)    Leukocytes, UA LARGE (*)    WBC, UA >50 (*)    All other components within normal limits  URINE CULTURE   ____________________________________________  EKG  none  ____________________________________________  RADIOLOGY  none  ____________________________________________   PROCEDURES  Procedure(s) performed: None Procedures Critical Care performed:  None ____________________________________________   INITIAL IMPRESSION / ASSESSMENT AND PLAN / ED COURSE  77 y.o. male with history as listed below who presents for evaluation of urinary incontinence.  Patient reports few weeks of progressively worsening urinary incontinence x few weeks. Ddx UTI vs overflow incontinence from BPH vs sphincter dysfunction. Will do bladder scan, check UA, kidney function, and basic labs.     _________________________ 6:08 PM on 04/07/2018 -----------------------------------------  UA positive for UTI. Leukocytosis but no fever or other signs of sepsis. Patient given rocephin, culture pending. Patient will be dc home on keflex. Patient has no back or flank pain concerning for kidney stone. No urinary retention  with PVR of 25cc.  Discussed return precautions for worsening infection and recommended follow-up with primary care doctor Monday.   As part of my medical decision making, I reviewed the following data within the [place] notes reviewed and incorporated, Labs reviewed , Old chart reviewed, Notes from prior ED visits and [place] Controlled Substance Database    Pertinent labs & imaging results that were available during my care of the patient were reviewed by me and considered in my medical decision making (see chart for details).    ____________________________________________   FINAL CLINICAL IMPRESSION(S) / ED DIAGNOSES  Final diagnoses:  Acute cystitis without hematuria      NEW MEDICATIONS STARTED DURING THIS VISIT:  ED Discharge Orders        Ordered    cephALEXin (KEFLEX) 500 MG capsule  3 times daily     04/07/18 1807       Note:  This document was prepared using Dragon voice recognition software and may include unintentional dictation errors.    Alfred Levins, [place], MD 04/07/18 [phone]

## 2018-04-07 NOTE — ED Triage Notes (Addendum)
Pt to ED via POV with c/o " swelling in his testicles" PT states it has been going on 4-60yrs. Pt denies any trouble with urination. PT poor historian. VSS ,. A&Ox4. Scrotum noted to be swollen with no pain

## 2018-04-09 LAB — URINE CULTURE: Culture: >=100000 — AB

## 2018-04-20 ENCOUNTER — Ambulatory Visit: Payer: Medicare Other | Admitting: Family

## 2018-04-20 ENCOUNTER — Encounter: Payer: Self-pay | Admitting: Family

## 2018-04-20 VITALS — BP 150/90 | HR 88 | Resp 18 | Ht 73.0 in | Wt 200.4 lb

## 2018-04-20 DIAGNOSIS — I5032 Chronic diastolic (congestive) heart failure: Secondary | ICD-10-CM

## 2018-04-20 DIAGNOSIS — M109 Gout, unspecified: Secondary | ICD-10-CM

## 2018-04-20 DIAGNOSIS — I482 Chronic atrial fibrillation: Secondary | ICD-10-CM | POA: Insufficient documentation

## 2018-04-20 DIAGNOSIS — I10 Essential (primary) hypertension: Secondary | ICD-10-CM

## 2018-04-20 DIAGNOSIS — A419 Sepsis, unspecified organism: Secondary | ICD-10-CM | POA: Diagnosis not present

## 2018-04-20 DIAGNOSIS — Z79899 Other long term (current) drug therapy: Secondary | ICD-10-CM | POA: Insufficient documentation

## 2018-04-20 DIAGNOSIS — Z7982 Long term (current) use of aspirin: Secondary | ICD-10-CM

## 2018-04-20 DIAGNOSIS — I110 Hypertensive heart disease with heart failure: Secondary | ICD-10-CM

## 2018-04-20 DIAGNOSIS — I480 Paroxysmal atrial fibrillation: Secondary | ICD-10-CM

## 2018-04-20 DIAGNOSIS — I890 Lymphedema, not elsewhere classified: Secondary | ICD-10-CM

## 2018-04-20 NOTE — Patient Instructions (Signed)
Continue weighing daily and call for an overnight weight gain of > 2 pounds or a weekly weight gain of >5 pounds. 

## 2018-04-20 NOTE — Progress Notes (Signed)
Patient ID: George Bolton, male    DOB: 09-May-1941, 77 y.o.   MRN: [id number]  HPI  George Bolton is a 77 y/o male with a history of HTN, atrial fibrillation, Bolton and chronic heart failure.   Echo report from 03/03/18 reviewed and showed an EF of 60-65%.   Was in the ED 04/07/18 due to acute cystitis where he was treated and released. Admitted 02/28/18 due to acute on chronic HF. Cardiology consult obtained. Initially needed IV lasix and then transitioned to oral diuretics. Due to decrease in LV capacity, watch for overdiuresis. Discharged after 4 days.   He presents today for a follow-up visit with a chief complaint of mild shortness of breath upon moderate exertion. He describes this as chronic in nature having been present for several years. He has associated fatigue, pedal edema or neck pain. He denies any chest pain, palpitations, cough, shortness of breath, dizziness, anxiety or weight gain. Has an upcoming appointment with urology.   Past Medical History:  Diagnosis Date  . Chronic atrial fibrillation (George Bolton)    a. Afib dates back to at least 2002 when reviewing prior EKG with EKGs from 2014 onward showing Afib; b. CHADS2VASc at least 4 (CHF, HTN, age x 2)  . Chronic diastolic CHF (congestive heart failure) (George Bolton)    a. TTE 5/19: EF 70-75%, mod LVH, near complete obliteration of the mid and apical LV cavity (can be seen in apical hypertrophic CM), mildly dilated LA, RV cavity size nl, RV wall thickness nl, RVSF mildly reduced, mildly dilated RA; b. 02/2018 Limited echo w/ definity: EF 50-65%, no apical hypertrophic CM.  George Bolton   . Hypertension    Past Surgical History:  Procedure Laterality Date  . CHOLECYSTECTOMY N/A 01/13/2017   Procedure: LAPAROSCOPIC CHOLECYSTECTOMY;  Surgeon: Jules Husbands, MD;  Location: [facility] ORS;  Service: General;  Laterality: N/A;   Family History  Problem Relation Age of Onset  . Stroke Mother    Social History   Tobacco Use  . Smoking status: Never Smoker   . Smokeless tobacco: Never Used  Substance Use Topics  . Alcohol use: No   Allergies  Allergen Reactions  . Cortisone    Past Medical History:  Diagnosis Date  . Chronic atrial fibrillation ([place])    a. Afib dates back to at least 2002 when reviewing prior EKG with EKGs from 2014 onward showing Afib; b. CHADS2VASc at least 4 (CHF, HTN, age x 2)  . Chronic diastolic CHF (congestive heart failure) (George Bolton)    a. TTE 5/19: EF 70-75%, mod LVH, near complete obliteration of the mid and apical LV cavity (can be seen in apical hypertrophic CM), mildly dilated LA, RV cavity size nl, RV wall thickness nl, RVSF mildly reduced, mildly dilated RA; b. 02/2018 Limited echo w/ definity: EF 50-65%, no apical hypertrophic CM.  George Bolton   . Hypertension    Past Surgical History:  Procedure Laterality Date  . CHOLECYSTECTOMY N/A 01/13/2017   Procedure: LAPAROSCOPIC CHOLECYSTECTOMY;  Surgeon: Jules Husbands, MD;  Location: [facility] ORS;  Service: General;  Laterality: N/A;   Family History  Problem Relation Age of Onset  . Stroke Mother    Social History   Tobacco Use  . Smoking status: Never Smoker  . Smokeless tobacco: Never Used  Substance Use Topics  . Alcohol use: No   Allergies  Allergen Reactions  . Cortisone    Prior to Admission medications   Medication Sig Start Date End Date Taking?  Authorizing Provider  aspirin 325 MG tablet Take 325 mg by mouth daily.   Yes [provider]  atorvastatin (LIPITOR) 40 MG tablet Take 1 tablet by mouth daily.   Yes [provider]  feeding supplement, ENSURE ENLIVE, (ENSURE ENLIVE) LIQD Take 237 mLs by mouth 2 (two) times daily between meals. 03/04/18  Yes Gladstone Lighter, MD  furosemide (LASIX) 20 MG tablet Take 1 tablet (20 mg total) by mouth daily. 03/04/18  Yes Gladstone Lighter, MD  metoprolol tartrate (LOPRESSOR) 50 MG tablet Take 1 tablet (50 mg total) by mouth 2 (two) times daily. 03/04/18  Yes Gladstone Lighter, MD  diltiazem  (CARDIZEM CD) 180 MG 24 hr capsule Take 1 capsule (180 mg total) by mouth daily. Patient not taking: Reported on 04/20/2018 03/04/18   Gladstone Lighter, MD  Review of Systems  Constitution: Positive for malaise/fatigue. Negative for decreased appetite.  HENT: Negative for congestion, hoarse voice and sore throat.   Eyes: Negative.   Cardiovascular: Positive for dyspnea on exertion and leg swelling. Negative for chest pain and palpitations.  Respiratory: Negative for cough, shortness of breath and wheezing.   Endocrine: Negative.   Hematologic/Lymphatic: Negative.   Skin: Negative.   Musculoskeletal: Positive for joint pain (left shoulder) and neck pain.  Gastrointestinal: Negative for bloating and abdominal pain.  Genitourinary: Negative.   Neurological: Negative for dizziness, light-headedness and weakness.  Psychiatric/Behavioral: Negative for depression. The patient has insomnia (waking up every 3-4 hours). The patient is not nervous/anxious.   Allergic/Immunologic: Negative.    Vitals:   04/20/18 1254 04/20/18 1310  BP: (!) 160/108 (!) 150/90  Pulse: (!) 102 88  Resp: 18   SpO2: 98% 100%  Weight: 200 lb 6 oz (90.9 kg)   Height: 6\' 1"  (1.854 m)    Wt Readings from Last 3 Encounters:  04/20/18 200 lb 6 oz (90.9 kg)  03/14/18 198 lb 8 oz (90 kg)  03/04/18 200 lb (90.7 kg)   Lab Results  Component Value Date   CREATININE 1.47 (H) 04/07/2018   CREATININE 1.60 (H) 03/04/2018   CREATININE 1.68 (H) 03/03/2018    Physical Exam  Constitutional: He is oriented to person, place, and time. He appears well-developed and well-nourished.  HENT:  Head: Normocephalic and atraumatic.  Neck: Normal range of motion. Neck supple. No JVD present.  Cardiovascular: Normal rate. An irregular rhythm present.  Pulmonary/Chest: Effort normal. No respiratory distress. He has no wheezes. He has no rales.  Abdominal: Soft. He exhibits no distension.  Musculoskeletal: He exhibits edema (2+ pitting  edema with L>R). He exhibits no tenderness.  Neurological: He is alert and oriented to person, place, and time.  Skin: Skin is warm and dry.  Psychiatric: He has a normal mood and affect. His behavior is normal.  Nursing note and vitals reviewed.  Assessment & Plan:  1: Chronic heart failure with preserved ejection fraction- - NYHA class II - mildly fluid overloaded with pedal edema - weighing daily and says that his weight has been stable. Reminded to call for an overnight weight gain of >2 pounds or a weekly weight gain of >5 pounds - weight similar to last time he was here - not adding salt except on watermelon. Reminded to keep daily sodium intake to 2000mg  a day  - BNP 01/11/17 was 530.0  2: HTN- - BP elevated but he says that he hasn't taken his diuretic yet today & will do so once he returns home - follows with PCP Posey Pronto at [facility]) regarding this - BMP 04/07/18 reviewed and showed sodium 138, potassium 3.9 and GFR 51  3: Atrial fibrillation- - currently rate controlled on lopressor - on aspirin alone at this time as patient has refused eliquis - [facility] ([facility]) saw patient during recent admission  4: Lymphedema- - stage 2 - not elevating legs much and he was encouraged to elevate them when sitting for long periods of time - not wearing support socks and he was encouraged to get some and start putting them on in the morning with removal at bedtime - encouraged him to increase his activity - should edema persist, could consider compression pumps  Patient did not bring his medications nor a list. Each medication was verbally reviewed with the patient and he was encouraged to bring the bottles to every visit to confirm accuracy of list.  Return in 3 months or sooner for any questions/problems before then.

## 2018-04-21 ENCOUNTER — Encounter: Payer: Self-pay | Admitting: Urology

## 2018-04-21 ENCOUNTER — Emergency Department: Payer: Medicare Other

## 2018-04-21 ENCOUNTER — Encounter: Payer: Self-pay | Admitting: *Deleted

## 2018-04-21 ENCOUNTER — Encounter: Payer: Self-pay | Admitting: Family

## 2018-04-21 ENCOUNTER — Other Ambulatory Visit: Payer: Self-pay

## 2018-04-21 ENCOUNTER — Ambulatory Visit: Payer: Medicare Other | Admitting: Urology

## 2018-04-21 ENCOUNTER — Inpatient Hospital Stay
Admission: EM | Admit: 2018-04-21 | Discharge: 2018-04-24 | DRG: 872 | Disposition: A | Discharge status: Home / Self Care | Payer: Medicare Other | Attending: Internal Medicine | Admitting: Internal Medicine

## 2018-04-21 VITALS — BP 144/86 | HR 88 | Ht 73.0 in | Wt 195.0 lb

## 2018-04-21 DIAGNOSIS — I110 Hypertensive heart disease with heart failure: Secondary | ICD-10-CM | POA: Diagnosis present

## 2018-04-21 DIAGNOSIS — N432 Other hydrocele: Secondary | ICD-10-CM | POA: Diagnosis not present

## 2018-04-21 DIAGNOSIS — E785 Hyperlipidemia, unspecified: Secondary | ICD-10-CM | POA: Diagnosis present

## 2018-04-21 DIAGNOSIS — Z87440 Personal history of urinary (tract) infections: Secondary | ICD-10-CM | POA: Diagnosis not present

## 2018-04-21 DIAGNOSIS — N138 Other obstructive and reflux uropathy: Secondary | ICD-10-CM | POA: Diagnosis not present

## 2018-04-21 DIAGNOSIS — Z888 Allergy status to other drugs, medicaments and biological substances status: Secondary | ICD-10-CM

## 2018-04-21 DIAGNOSIS — N3001 Acute cystitis with hematuria: Secondary | ICD-10-CM

## 2018-04-21 DIAGNOSIS — Z9049 Acquired absence of other specified parts of digestive tract: Secondary | ICD-10-CM | POA: Diagnosis not present

## 2018-04-21 DIAGNOSIS — I482 Chronic atrial fibrillation: Secondary | ICD-10-CM | POA: Diagnosis present

## 2018-04-21 DIAGNOSIS — A419 Sepsis, unspecified organism: Secondary | ICD-10-CM | POA: Diagnosis present

## 2018-04-21 DIAGNOSIS — N390 Urinary tract infection, site not specified: Secondary | ICD-10-CM | POA: Diagnosis present

## 2018-04-21 DIAGNOSIS — M109 Gout, unspecified: Secondary | ICD-10-CM | POA: Diagnosis present

## 2018-04-21 DIAGNOSIS — Z79899 Other long term (current) drug therapy: Secondary | ICD-10-CM | POA: Diagnosis not present

## 2018-04-21 DIAGNOSIS — N401 Enlarged prostate with lower urinary tract symptoms: Secondary | ICD-10-CM | POA: Diagnosis not present

## 2018-04-21 DIAGNOSIS — I5032 Chronic diastolic (congestive) heart failure: Secondary | ICD-10-CM | POA: Diagnosis present

## 2018-04-21 DIAGNOSIS — N400 Enlarged prostate without lower urinary tract symptoms: Secondary | ICD-10-CM | POA: Diagnosis present

## 2018-04-21 DIAGNOSIS — R972 Elevated prostate specific antigen [PSA]: Secondary | ICD-10-CM

## 2018-04-21 DIAGNOSIS — Z7982 Long term (current) use of aspirin: Secondary | ICD-10-CM | POA: Diagnosis not present

## 2018-04-21 LAB — URINALYSIS, COMPLETE (UACMP) WITH MICROSCOPIC
Bilirubin Urine: NEGATIVE
Glucose, UA: NEGATIVE mg/dL
Ketones, ur: NEGATIVE mg/dL
Nitrite: NEGATIVE
Protein, ur: 100 mg/dL — AB
RBC / HPF: >50 RBC/hpf — ABNORMAL HIGH (ref 0–5)
Specific Gravity, Urine: 1.011 (ref 1.005–1.030)
WBC, UA: >50 WBC/hpf — ABNORMAL HIGH (ref 0–5)
pH: 5 (ref 5.0–8.0)

## 2018-04-21 LAB — URINALYSIS, COMPLETE
Bilirubin, UA: NEGATIVE
Glucose, UA: NEGATIVE
Ketones, UA: NEGATIVE
Nitrite, UA: POSITIVE — AB
Specific Gravity, UA: 1.02 (ref 1.005–1.030)
Urobilinogen, Ur: 0.2 mg/dL (ref 0.2–1.0)
pH, UA: 5.5 (ref 5.0–7.5)

## 2018-04-21 LAB — COMPREHENSIVE METABOLIC PANEL
ALT: 14 U/L (ref 0–44)
AST: 27 U/L (ref 15–41)
Albumin: 4 g/dL (ref 3.5–5.0)
Alkaline Phosphatase: 78 U/L (ref 38–126)
Anion gap: 14 (ref 5–15)
BUN: 34 mg/dL — ABNORMAL HIGH (ref 8–23)
CO2: 20 mmol/L — ABNORMAL LOW (ref 22–32)
Calcium: 9.9 mg/dL (ref 8.9–10.3)
Chloride: 105 mmol/L (ref 98–111)
Creatinine, Ser: 1.71 mg/dL — ABNORMAL HIGH (ref 0.61–1.24)
GFR calc Af Amer: 42 mL/min — ABNORMAL LOW (ref >60)
GFR calc non Af Amer: 36 mL/min — ABNORMAL LOW (ref >60)
Glucose, Bld: 142 mg/dL — ABNORMAL HIGH (ref 70–99)
Potassium: 4.2 mmol/L (ref 3.5–5.1)
Sodium: 139 mmol/L (ref 135–145)
Total Bilirubin: 0.7 mg/dL (ref 0.3–1.2)
Total Protein: 8.1 g/dL (ref 6.5–8.1)

## 2018-04-21 LAB — TROPONIN I: Troponin I: 0.03 ng/mL (ref <0.03)

## 2018-04-21 LAB — MICROSCOPIC EXAMINATION
RBC, UA: >30 /hpf — AB (ref 0–2)
WBC, UA: >30 /hpf — AB (ref 0–5)

## 2018-04-21 LAB — CBC WITH DIFFERENTIAL/PLATELET
Basophils Absolute: 0 10*3/uL (ref 0–0.1)
Basophils Relative: 0 %
Eosinophils Absolute: 0.1 10*3/uL (ref 0–0.7)
Eosinophils Relative: 1 %
HCT: 47.5 % (ref 40.0–52.0)
Hemoglobin: 16 g/dL (ref 13.0–18.0)
Lymphocytes Relative: 4 %
Lymphs Abs: 0.5 10*3/uL — ABNORMAL LOW (ref 1.0–3.6)
MCH: 28.5 pg (ref 26.0–34.0)
MCHC: 33.6 g/dL (ref 32.0–36.0)
MCV: 84.8 fL (ref 80.0–100.0)
Monocytes Absolute: 0.1 10*3/uL — ABNORMAL LOW (ref 0.2–1.0)
Monocytes Relative: 1 %
Neutro Abs: 10.5 10*3/uL — ABNORMAL HIGH (ref 1.4–6.5)
Neutrophils Relative %: 94 %
Platelets: 228 10*3/uL (ref 150–440)
RBC: 5.61 MIL/uL (ref 4.40–5.90)
RDW: 17.2 % — ABNORMAL HIGH (ref 11.5–14.5)
WBC: 11.1 10*3/uL — ABNORMAL HIGH (ref 3.8–10.6)

## 2018-04-21 LAB — LACTIC ACID, PLASMA
Lactic Acid, Venous: 3.2 mmol/L (ref 0.5–1.9)
Lactic Acid, Venous: 2.1 mmol/L (ref 0.5–1.9)

## 2018-04-21 LAB — BLADDER SCAN AMB NON-IMAGING

## 2018-04-21 IMAGING — DX DG CHEST 1V PORT
2 series · 2 of 2 positions shown · non-contrast
Comparison: None.

CLINICAL DATA: Allergic reaction

EXAM:
PORTABLE CHEST 1 VIEW

[chest ap (1 of 2)]
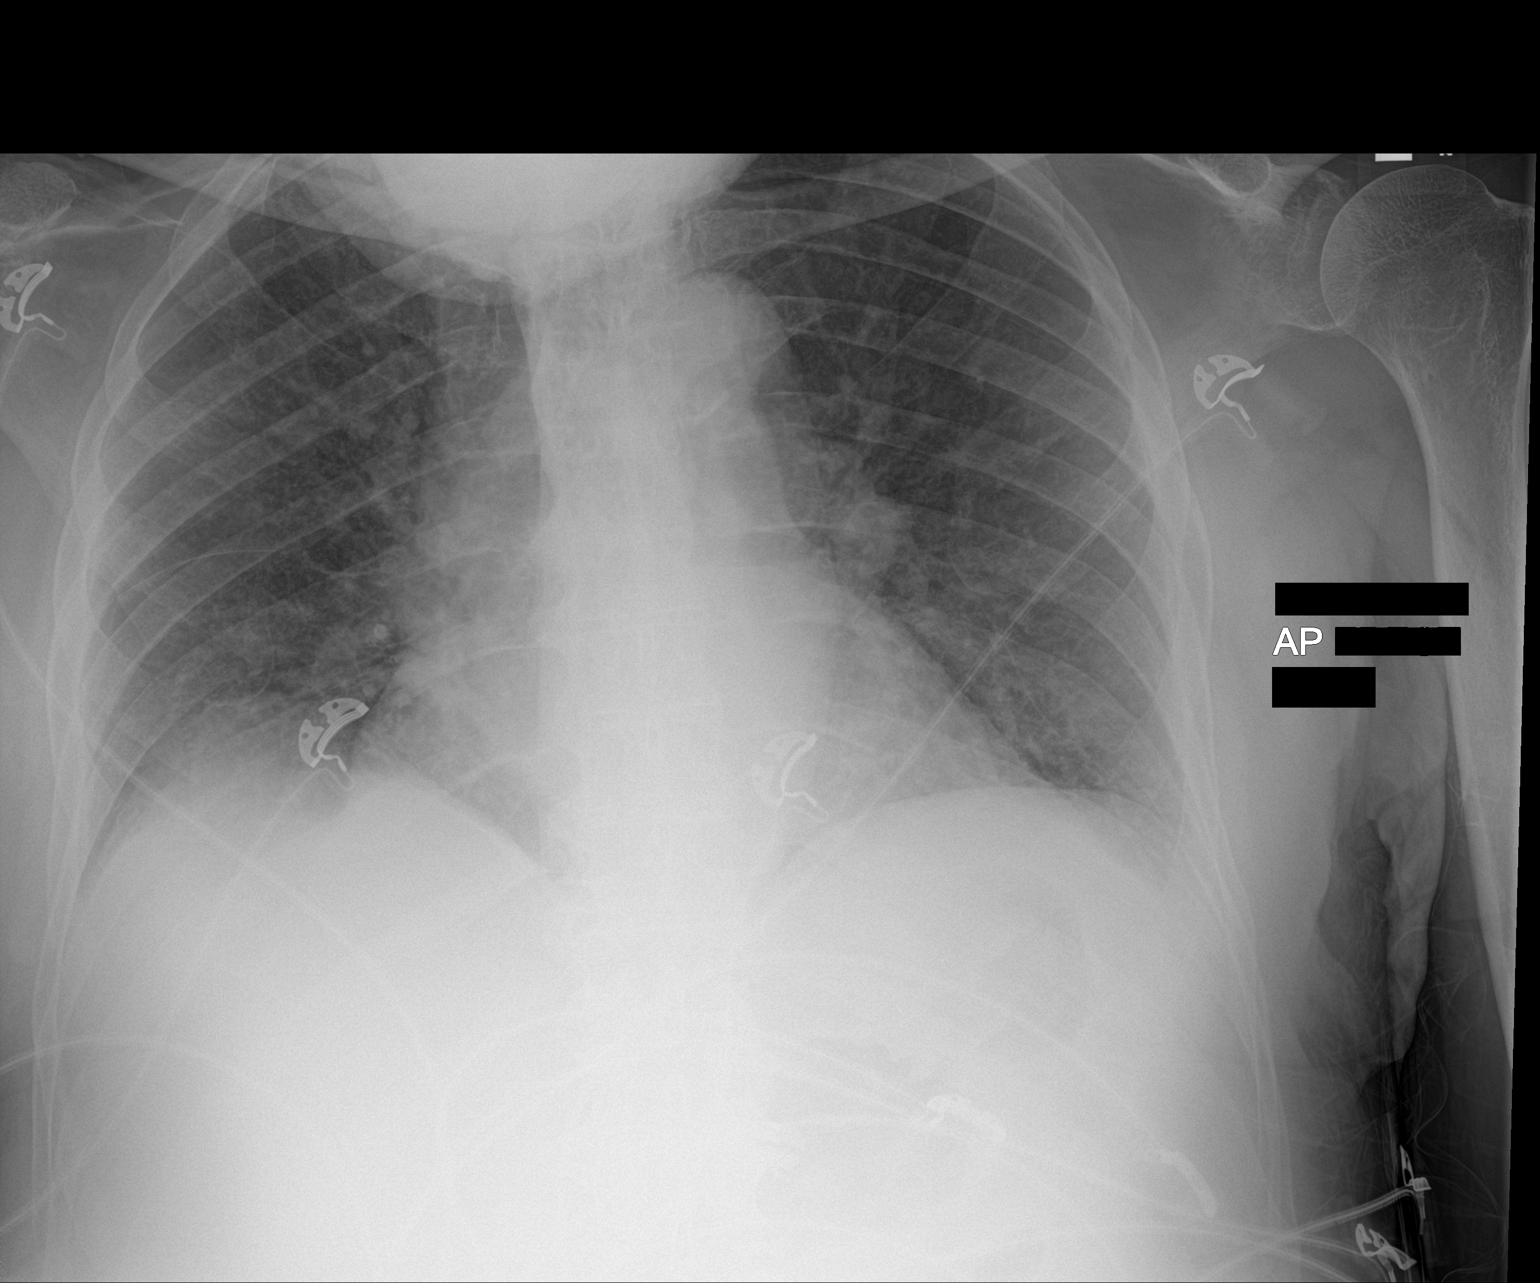

[chest ap (2 of 2)]
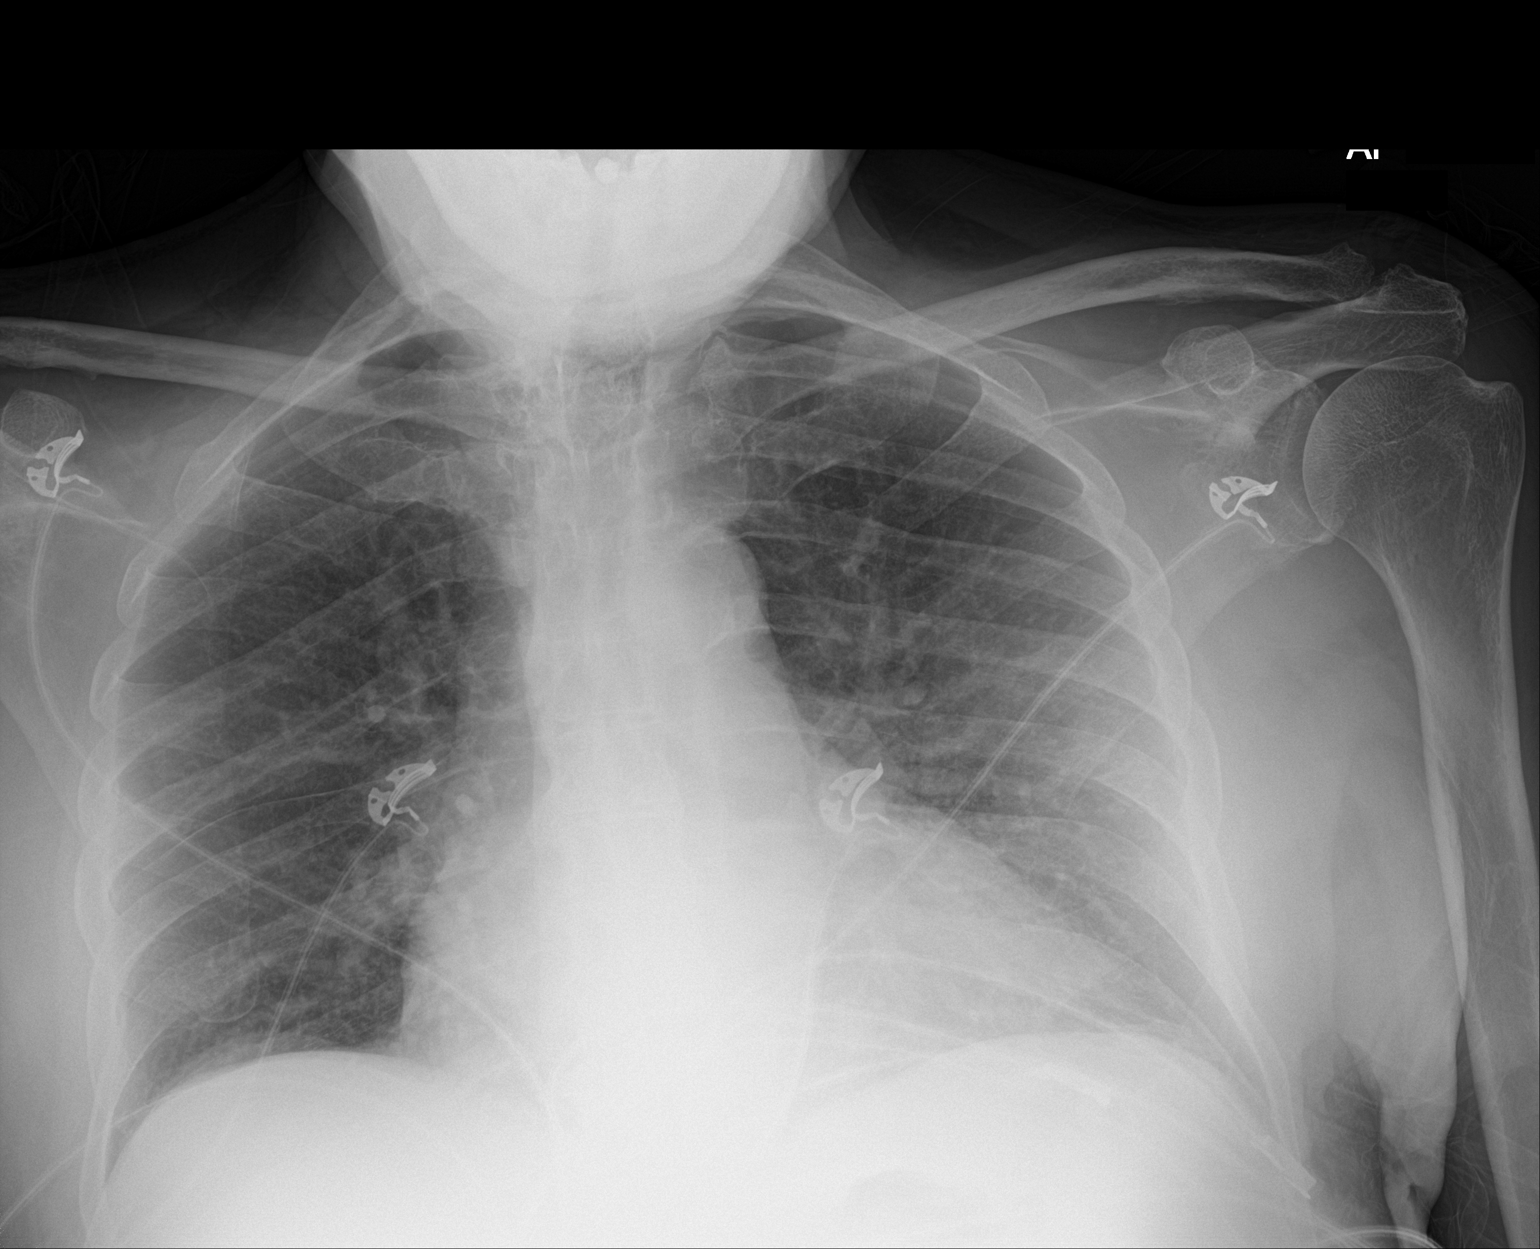

[2 of 2 positions shown; findings below may reference images not displayed]

FINDINGS: Cardiomegaly with mild aortic atherosclerosis. Interstitial edema is
noted without pulmonary consolidation, effusion or pneumothorax.
Lung volumes are slightly low in appearance. The patient's chin
obscures the apices of the lungs. No acute osseous abnormality.
IMPRESSION: Stable cardiomegaly with aortic atherosclerosis. Mild interstitial
edema is redemonstrated.

## 2018-04-21 MED ORDER — SODIUM CHLORIDE 0.9 % IV BOLUS
500.0000 mL | Freq: Once | INTRAVENOUS | Status: AC
  Administered 2018-04-21: 500 mL via INTRAVENOUS

## 2018-04-21 MED ORDER — TAMSULOSIN HCL 0.4 MG PO CAPS
0.4000 mg | ORAL_CAPSULE | Freq: Every day | ORAL | Status: DC
  Administered 2018-04-22 – 2018-04-24 (×3): 0.4 mg via ORAL
  Filled 2018-04-21 (×3): qty 1

## 2018-04-21 MED ORDER — FINASTERIDE 5 MG PO TABS
5.0000 mg | ORAL_TABLET | Freq: Every day | ORAL | Status: DC
  Administered 2018-04-22 – 2018-04-24 (×3): 5 mg via ORAL
  Filled 2018-04-21 (×3): qty 1

## 2018-04-21 MED ORDER — TAMSULOSIN HCL 0.4 MG PO CAPS: 0.4000 mg | ORAL_CAPSULE | Freq: Every day | ORAL | 11 refills | Status: DC

## 2018-04-21 MED ORDER — ASPIRIN EC 325 MG PO TBEC
325.0000 mg | DELAYED_RELEASE_TABLET | Freq: Every day | ORAL | Status: DC
  Administered 2018-04-22 – 2018-04-24 (×3): 325 mg via ORAL
  Filled 2018-04-21 (×3): qty 1

## 2018-04-21 MED ORDER — ENSURE ENLIVE PO LIQD
237.0000 mL | Freq: Two times a day (BID) | ORAL | Status: DC
  Administered 2018-04-22 – 2018-04-24 (×4): 237 mL via ORAL

## 2018-04-21 MED ORDER — HEPARIN SODIUM (PORCINE) 5000 UNIT/ML IJ SOLN
5000.0000 [IU] | Freq: Three times a day (TID) | INTRAMUSCULAR | Status: DC
  Administered 2018-04-22 – 2018-04-23 (×3): 5000 [IU] via SUBCUTANEOUS
  Filled 2018-04-21 (×3): qty 1

## 2018-04-21 MED ORDER — SULFAMETHOXAZOLE-TRIMETHOPRIM 800-160 MG PO TABS: 1.0000 | ORAL_TABLET | Freq: Two times a day (BID) | ORAL | 0 refills | Status: DC

## 2018-04-21 MED ORDER — SODIUM CHLORIDE 0.9 % IV SOLN
2.0000 g | Freq: Once | INTRAVENOUS | Status: AC
  Administered 2018-04-21: 2 g via INTRAVENOUS
  Filled 2018-04-21: qty 2

## 2018-04-21 MED ORDER — ACETAMINOPHEN 650 MG RE SUPP: 650.0000 mg | Freq: Four times a day (QID) | RECTAL | Status: DC | PRN

## 2018-04-21 MED ORDER — ATORVASTATIN CALCIUM 20 MG PO TABS
40.0000 mg | ORAL_TABLET | Freq: Every day | ORAL | Status: DC
  Administered 2018-04-22 – 2018-04-24 (×3): 40 mg via ORAL
  Filled 2018-04-21 (×3): qty 2

## 2018-04-21 MED ORDER — ACETAMINOPHEN 500 MG PO TABS
1000.0000 mg | ORAL_TABLET | Freq: Once | ORAL | Status: AC
  Administered 2018-04-21: 1000 mg via ORAL

## 2018-04-21 MED ORDER — METOPROLOL TARTRATE 50 MG PO TABS
50.0000 mg | ORAL_TABLET | Freq: Two times a day (BID) | ORAL | Status: DC
  Administered 2018-04-22 – 2018-04-24 (×5): 50 mg via ORAL
  Filled 2018-04-21 (×5): qty 1

## 2018-04-21 MED ORDER — FUROSEMIDE 20 MG PO TABS
20.0000 mg | ORAL_TABLET | Freq: Every day | ORAL | Status: DC
  Administered 2018-04-22 – 2018-04-23 (×2): 20 mg via ORAL
  Filled 2018-04-21 (×2): qty 1

## 2018-04-21 MED ORDER — ONDANSETRON HCL 4 MG PO TABS
4.0000 mg | ORAL_TABLET | Freq: Four times a day (QID) | ORAL | Status: DC | PRN
Start: 2018-04-21 — End: 2018-04-24

## 2018-04-21 MED ORDER — ACETAMINOPHEN 325 MG PO TABS: 650.0000 mg | ORAL_TABLET | Freq: Four times a day (QID) | ORAL | Status: DC | PRN

## 2018-04-21 MED ORDER — DILTIAZEM HCL ER COATED BEADS 180 MG PO CP24
180.0000 mg | ORAL_CAPSULE | Freq: Every day | ORAL | Status: DC
  Administered 2018-04-22 – 2018-04-24 (×3): 180 mg via ORAL
  Filled 2018-04-21 (×3): qty 1

## 2018-04-21 MED ORDER — DOCUSATE SODIUM 100 MG PO CAPS
100.0000 mg | ORAL_CAPSULE | Freq: Two times a day (BID) | ORAL | Status: DC
  Administered 2018-04-22 – 2018-04-24 (×3): 100 mg via ORAL
  Filled 2018-04-21 (×5): qty 1

## 2018-04-21 MED ORDER — ONDANSETRON HCL 4 MG/2ML IJ SOLN: 4.0000 mg | Freq: Four times a day (QID) | INTRAMUSCULAR | Status: DC | PRN

## 2018-04-21 MED ORDER — ACETAMINOPHEN 500 MG PO TABS
ORAL_TABLET | ORAL | Status: AC
  Administered 2018-04-21: 1000 mg via ORAL
  Filled 2018-04-21: qty 2

## 2018-04-21 MED ORDER — FINASTERIDE 5 MG PO TABS: 5.0000 mg | ORAL_TABLET | Freq: Every day | ORAL | 11 refills | Status: DC

## 2018-04-21 MED ORDER — SODIUM CHLORIDE 0.9 % IV SOLN
INTRAVENOUS | Status: DC
  Administered 2018-04-21 – 2018-04-22 (×2): via INTRAVENOUS

## 2018-04-21 MED ORDER — SODIUM CHLORIDE 0.9 % IV SOLN
2.0000 g | INTRAVENOUS | Status: DC
  Administered 2018-04-22 – 2018-04-23 (×2): 2 g via INTRAVENOUS
  Filled 2018-04-21 (×3): qty 2

## 2018-04-21 NOTE — Progress Notes (Signed)
Pharmacy Antibiotic Note  George Bolton is a 77 y.o. male admitted on 04/21/2018 with UTI.  Pharmacy has been consulted for Cefepime dosing.  Patient recently in hospital for CHF and had UTI w/ klebsiella (03/01/18) per Urology note from today 6/27  Plan: Patient to receive Cefepime 2 gram IV x 1 in ER. Will continue with Cefepime 2 gram IV q24h based on Crcl 46 ml/min    Height: 6\' 1"  (185.4 cm) Weight: 200 lb (90.7 kg) IBW/kg (Calculated) : 79.9  Temp (24hrs), Avg:100.5 F (38.1 C), Min:98.2 F (36.8 C), Max:104.6 F (40.3 C)  Recent Labs  Lab 04/21/18 1910  WBC 11.1*    Estimated Creatinine Clearance: 46 mL/min (A) (by C-G formula based on SCr of 1.47 mg/dL (H)).    Allergies  Allergen Reactions  . Cortisone     Antimicrobials this admission: cefepime 6/27 >>       >>    Dose adjustments this admission:    Microbiology results: 6/27 BCx: pending 6/27 UCx: pending    Sputum:      MRSA PCR:    Thank you for allowing pharmacy to be a part of this patient's care.  George Bolton A 04/21/2018 7:47 PM

## 2018-04-21 NOTE — ED Triage Notes (Signed)
Pt to triage via wheelchair.  Pt was seen at the urologist today and started on new meds.  Now pt feels sob , chills with weakness.  Pt alert.

## 2018-04-21 NOTE — ED Notes (Addendum)
Pt called out, this RN went immediatly into room, pt stating "put the rail down and get me up." This RN helped pt to side of bed, pt states "I can't pee like this." This RN stated to pt that I would get urinal, pt assisted to stand up on side of bed and urinal provided. Pt states "I"m not weak, I can do this." Pt's HR increased to 140's during standing but pt was able to void. Pt sat back on bed post void and wanted to sit on side of bed. Pt's slick socks changed to hospital socks, call bell within reach of pt.

## 2018-04-21 NOTE — Progress Notes (Signed)
CODE SEPSIS - PHARMACY COMMUNICATION  **Broad Spectrum Antibiotics should be administered within 1 hour of Sepsis diagnosis**  Time Code Sepsis Called/Page Received: 1941  Antibiotics Ordered: cefepime  Time of 1st antibiotic administration: 1959  Additional action taken by pharmacy:    If necessary, Name of Provider/Nurse Contacted:      Noralee Space ,PharmD Clinical Pharmacist  04/21/2018  8:02 PM

## 2018-04-21 NOTE — ED Notes (Signed)
Pt repositioned to the left per pt request, pt resting at this time, family in rm.

## 2018-04-21 NOTE — Progress Notes (Signed)
04/21/2018 10:14 AM   George Bolton 1941-05-04 [id number]  Referring provider: Denton Lank, MD [address] [place], [place] [address]  Chief Complaint  Patient presents with  . Elevated PSA    New Patient    HPI: 77 year old male who presents for further evaluation of elevated PSA.  His PSA was checked by his primary care physician in [facility] on 12/24/2017 and noted to be elevated to 5.7.  We have no previous PSA.  He was referred to urology for further evaluation.  He has multiple medical comorbidities and was recently discharged from the hospital for CHF exacerbation.  He was also noted to have a positive urinalysis during this admission and treated with Keflex.  He grew Klebsiella pneumonia on 03/01/2018.  It appears that this was repeated on 04/07/2018 and he continued to grow Klebsiella in the ED.  He was prescribed Keflex again.  At that time, PVR was 25 cc.    Incidentally today on bladder scan, he is noted to have a liter in his bladder.  She was unable to void and felt empty.  Foley catheter was placed and there is little to no urine return.  Subsequent bladder scan showed that his bladder was empty.  Presumably bladder scan was picking up abdominal fluid.  Catheter was subsequently removed.  In addition to this, his UA is grossly positive including greater than 30 white-red blood cells and greater than 30 red blood cells per high-powered field with many bacteria positive.  He is not currently on antibiotics.    His baseline creatinine is around 1.7.  He has chronic stage III CKD.  He has had chills with dysuria.  No gross hematuria.  He has a weak stream. He aslo frequency which he relates to his lasix.  IPSS as below.  He also notes today that he has had testicular swelling for 4 to 5 years.  This is unchanged.  This is bothersome a time with a sensation of heaviness in his scrotum.  He relates this to his CHF.  No testicular trauma.   IPSS      Row Name 04/21/18 0900         International Prostate Symptom Score   How often have you had the sensation of not emptying your bladder?  About half the time     How often have you had to urinate less than every two hours?  More than half the time     How often have you found you stopped and started again several times when you urinated?  Less than half the time     How often have you found it difficult to postpone urination?  Less than 1 in 5 times     How often have you had a weak urinary stream?  Almost always     How often have you had to strain to start urination?  Less than half the time     How many times did you typically get up at night to urinate?  5 Times     Total IPSS Score  22       Quality of Life due to urinary symptoms   If you were to spend the rest of your life with your urinary condition just the way it is now how would you feel about that?  Terrible        Score:  1-7 Mild 8-19 Moderate 20-35 Severe  PMH: Past Medical History:  Diagnosis Date  .  Chronic atrial fibrillation ([place])    a. Afib dates back to at least 2002 when reviewing prior EKG with EKGs from 2014 onward showing Afib; b. CHADS2VASc at least 4 (CHF, HTN, age x 2)  . Chronic diastolic CHF (congestive heart failure) ([place])    a. TTE 5/19: EF 70-75%, mod LVH, near complete obliteration of the mid and apical LV cavity (can be seen in apical hypertrophic CM), mildly dilated LA, RV cavity size nl, RV wall thickness nl, RVSF mildly reduced, mildly dilated RA; b. 02/2018 Limited echo w/ definity: EF 50-65%, no apical hypertrophic CM.  Marland Kitchen Gout   . Hypertension     Surgical History: Past Surgical History:  Procedure Laterality Date  . CHOLECYSTECTOMY N/A 01/13/2017   Procedure: LAPAROSCOPIC CHOLECYSTECTOMY;  Surgeon: Jules Husbands, MD;  Location: [facility] ORS;  Service: General;  Laterality: N/A;    Home Medications:  Allergies as of 04/21/2018      Reactions   Cortisone       Medication List         Accurate as of 04/21/18 10:14 AM. Always use your most recent med list.          aspirin 325 MG tablet Take 325 mg by mouth daily.   atorvastatin 40 MG tablet Commonly known as:  LIPITOR Take 1 tablet by mouth daily.   diltiazem 180 MG 24 hr capsule Commonly known as:  CARDIZEM CD Take 1 capsule (180 mg total) by mouth daily.   feeding supplement (ENSURE ENLIVE) Liqd Take 237 mLs by mouth 2 (two) times daily between meals.   finasteride 5 MG tablet Commonly known as:  PROSCAR Take 1 tablet (5 mg total) by mouth daily.   furosemide 20 MG tablet Commonly known as:  LASIX Take 1 tablet (20 mg total) by mouth daily.   metoprolol tartrate 50 MG tablet Commonly known as:  LOPRESSOR Take 1 tablet (50 mg total) by mouth 2 (two) times daily.   sulfamethoxazole-trimethoprim 800-160 MG tablet Commonly known as:  BACTRIM DS,SEPTRA DS Take 1 tablet by mouth every 12 (twelve) hours.   tamsulosin 0.4 MG Caps capsule Commonly known as:  FLOMAX Take 1 capsule (0.4 mg total) by mouth daily.       Allergies:  Allergies  Allergen Reactions  . Cortisone     Family History: Family History  Problem Relation Age of Onset  . Stroke Mother     Social History:  reports that he has never smoked. He has never used smokeless tobacco. He reports that he does not drink alcohol or use drugs.  ROS: UROLOGY Frequent Urination?: Yes Hard to postpone urination?: Yes Burning/pain with urination?: Yes Get up at night to urinate?: Yes Leakage of urine?: Yes Urine stream starts and stops?: Yes Trouble starting stream?: Yes Do you have to strain to urinate?: Yes Blood in urine?: No Urinary tract infection?: Yes Sexually transmitted disease?: No Injury to kidneys or bladder?: No Painful intercourse?: No Weak stream?: No Erection problems?: Yes Penile pain?: No  Gastrointestinal Nausea?: No Vomiting?: No Indigestion/heartburn?: No Diarrhea?: No Constipation?:  Yes  Constitutional Fever: No Night sweats?: No Weight loss?: No Fatigue?: Yes  Skin Skin rash/lesions?: No Itching?: No  Eyes Blurred vision?: No Double vision?: No  Ears/Nose/Throat Sore throat?: No Sinus problems?: No  Hematologic/Lymphatic Swollen glands?: No Easy bruising?: No  Cardiovascular Leg swelling?: No Chest pain?: No  Respiratory Cough?: No Shortness of breath?: No  Endocrine Excessive thirst?: No  Musculoskeletal Back pain?: No Joint pain?:  No  Neurological Headaches?: No Dizziness?: No  Psychologic Depression?: No Anxiety?: No  Physical Exam: BP (!) 144/86   Pulse 88   Ht 6\' 1"  (1.854 m)   Wt 195 lb (88.5 kg)   BMI 25.73 kg/m   Constitutional:  Alert and oriented, No acute distress.   HEENT: [place] AT, moist mucus membranes.  Trachea midline, no masses. Cardiovascular: 2+ pitting lower extremity edema. Respiratory: Normal respiratory effort, no increased work of breathing. GI: Abdomen is soft, nontender, nondistended, no abdominal masses GU: Uncircumcised phallus with easily retractable foreskin.  Testicles unable to be palpated due to presence of presumed bilateral hydrocele, large limb incised bilaterally, nontender.  No scrotal edema. Rectal: Normal sphincter tone.  Enlarged 50+ cc prostate, nontender, no nodules. MSK: Significant kyphosis appreciated. Neurologic: Grossly intact, no focal deficits, moving all 4 extremities.  Ambulating with cane. Psychiatric: Normal mood and affect.  Laboratory Data: Lab Results  Component Value Date   WBC 14.2 (H) 04/07/2018   HGB 14.9 04/07/2018   HCT 44.4 04/07/2018   MCV 85.5 04/07/2018   PLT 195 04/07/2018    Lab Results  Component Value Date   CREATININE 1.47 (H) 04/07/2018    Urinalysis Urinalysis today personally reviewed.  Greater than 30 red blood cells/white blood cells per high-powered field.   Pertinent Imaging: Results for orders placed during the hospital encounter of  02/28/18  US RENAL   Narrative CLINICAL DATA:  77 year old male with acute renal failure. Initial encounter.  EXAM: RENAL / URINARY TRACT ULTRASOUND COMPLETE  COMPARISON:  01/11/2017 CT.  FINDINGS: Right Kidney:  Length: 9.3 cm. Lobulated contour. No hydronephrosis. Several renal cysts largest measuring 4 cm and 2.4 cm. Upper pole cyst with tiny associated calcification. CT detected lower pole cysts not as well delineated on present ultrasound.  Left Kidney:  Length: 13.1 cm. No hydronephrosis. At least 3 cysts largest measuring 6.3 cm and 5.6 cm.  Bladder:  Enlarged prostate gland impresses upon the bladder base.  IMPRESSION: No hydronephrosis.  Right kidney smaller than the left.  Multiple bilateral renal cysts largest on the left measures up to 6.3 cm.  Enlarged prostate gland impresses upon the bladder base. Clinical and laboratory correlation recommended to evaluate prostate gland.   Electronically Signed   By: Genia Del M.D.   On: 03/02/2018 10:21    Results for orders placed or performed in visit on 04/21/18  BLADDER SCAN AMB NON-IMAGING  Result Value Ref Range   Scan Result 92ml     Assessment & Plan:    1. 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 BPH with urinary obstruction Symptomatic BPH We will start patient on finasteride and Flomax Side effects reviewed  3. Acute cystitis with hematuria Symptomatic UTI Unable to clear infection Renal ultrasound without any clear nidus  for infection We will go ahead and treat again with a different antibiotic, Bactrim sent to pharmacy Repeat urine culture  4. Other hydrocele Symptomatic bilateral hydrocele Recommend supportive care We will consider surgical intervention but need to address the above issues  prior to discussing this further  Return in about 1 month (around 05/19/2018) for IPSS/ PVR/ UA.  Hollice Espy, MD  [facility] [address], [place], [place] [address] [phone]

## 2018-04-21 NOTE — ED Notes (Signed)
Floor unable to take report at this time.

## 2018-04-21 NOTE — ED Notes (Signed)
Pt repositioned to the right, call bell placed for pt reach. Pt able to roll from 1 side to other better at this time. Pt given sips of water per request. Pt resting at this time.

## 2018-04-21 NOTE — ED Notes (Signed)
Pt placed on 2L of oxygen, pt sleeping and O2 88% on RA.

## 2018-04-21 NOTE — ED Provider Notes (Signed)
[facility] Emergency Department Provider Note    First MD Initiated Contact with Patient 04/21/18 1908     (approximate)  I have reviewed the triage vital signs and the nursing notes.   HISTORY  Chief Complaint Code Sepsis    HPI George Bolton is a 77 y.o. male presents the ER via family.  Patient was at urology clinic today was recently started on Septra antibiotics.  Urinalysis today showed evidence of UTI.  Patient has had Klebsiella infection in the past.  No history of kidney stones and recent renal ultrasound showed no evidence of stone or hydronephrosis.  Family reports that patient started complaining of chills will sit on the porch and started looking very pale as if he was about to pass out.  Took the first dose of Septra at noon today and did not start feel unwell until 5:00 this evening.  Pain was initially worried that he was having some allergic reaction to the antibiotic.    Past Medical History:  Diagnosis Date  . Chronic atrial fibrillation ([place])    a. Afib dates back to at least 2002 when reviewing prior EKG with EKGs from 2014 onward showing Afib; b. CHADS2VASc at least 4 (CHF, HTN, age x 2)  . Chronic diastolic CHF (congestive heart failure) ([place])    a. TTE 5/19: EF 70-75%, mod LVH, near complete obliteration of the mid and apical LV cavity (can be seen in apical hypertrophic CM), mildly dilated LA, RV cavity size nl, RV wall thickness nl, RVSF mildly reduced, mildly dilated RA; b. 02/2018 Limited echo w/ definity: EF 50-65%, no apical hypertrophic CM.  Marland Kitchen Gout   . Hypertension    Family History  Problem Relation Age of Onset  . Stroke Mother    Past Surgical History:  Procedure Laterality Date  . CHOLECYSTECTOMY N/A 01/13/2017   Procedure: LAPAROSCOPIC CHOLECYSTECTOMY;  Surgeon: Jules Husbands, MD;  Location: [facility] ORS;  Service: General;  Laterality: N/A;   Patient Active Problem List   Diagnosis Date Noted  . Atrial fibrillation  ([place]) 03/15/2018  . Lymphedema 03/15/2018  . Gout 01/25/2017  . Cholecystitis   . CHF (congestive heart failure) ([place]) 04/25/2015  . Hypertension 04/25/2015      Prior to Admission medications   Medication Sig Start Date End Date Taking? Authorizing Provider  aspirin 325 MG tablet Take 325 mg by mouth daily.   Yes [provider]  atorvastatin (LIPITOR) 40 MG tablet Take 1 tablet by mouth daily.   Yes [provider]  diltiazem (CARDIZEM CD) 180 MG 24 hr capsule Take 1 capsule (180 mg total) by mouth daily. 03/04/18  Yes Gladstone Lighter, MD  feeding supplement, ENSURE ENLIVE, (ENSURE ENLIVE) LIQD Take 237 mLs by mouth 2 (two) times daily between meals. 03/04/18  Yes Gladstone Lighter, MD  finasteride (PROSCAR) 5 MG tablet Take 1 tablet (5 mg total) by mouth daily. 04/21/18  Yes Hollice Espy, MD  furosemide (LASIX) 20 MG tablet Take 1 tablet (20 mg total) by mouth daily. 03/04/18  Yes Gladstone Lighter, MD  metoprolol tartrate (LOPRESSOR) 50 MG tablet Take 1 tablet (50 mg total) by mouth 2 (two) times daily. 03/04/18  Yes Gladstone Lighter, MD  sulfamethoxazole-trimethoprim (BACTRIM DS,SEPTRA DS) 800-160 MG tablet Take 1 tablet by mouth every 12 (twelve) hours. 04/21/18  Yes Hollice Espy, MD  tamsulosin (FLOMAX) 0.4 MG CAPS capsule Take 1 capsule (0.4 mg total) by mouth daily. 04/21/18  Yes Hollice Espy, MD  Allergies Cortisone    Social History Social History   Tobacco Use  . Smoking status: Never Smoker  . Smokeless tobacco: Never Used  Substance Use Topics  . Alcohol use: No  . Drug use: No    Review of Systems Patient denies headaches, rhinorrhea, blurry vision, numbness, shortness of breath, chest pain, edema, cough, abdominal pain, nausea, vomiting, diarrhea, dysuria, fevers, rashes or hallucinations unless otherwise stated above in HPI. ____________________________________________   PHYSICAL EXAM:  VITAL SIGNS: Vitals:   04/21/18 1947  04/21/18 1948  BP:    Pulse:    Resp:    Temp: (!) 104.6 F (40.3 C) (!) 104.6 F (40.3 C)  SpO2:      Constitutional: Alert but ill appearing.  Eyes: Conjunctivae are normal.  Head: Atraumatic. Nose: No congestion/rhinnorhea. Mouth/Throat: Mucous membranes are moist.   Neck: No stridor. Painless ROM.  Cardiovascular: Normal rate, regular rhythm. Grossly normal heart sounds.  Good peripheral circulation. Respiratory: Normal respiratory effort.  No retractions. Lungs with bibasilar crackles Gastrointestinal: Soft and nontender. No distention. No abdominal bruits. No CVA tenderness. Genitourinary: deferred Musculoskeletal: No lower extremity tenderness nor edema.  No joint effusions. Neurologic:  Normal speech and language. No gross focal neurologic deficits are appreciated. No facial droop Skin:  Skin is warm, dry and intact. No rash noted. Psychiatric: Mood and affect are normal.   ____________________________________________   LABS (all labs ordered are listed, but only abnormal results are displayed)  Results for orders placed or performed during the hospital encounter of 04/21/18 (from the past 24 hour(s))  Lactic acid, plasma     Status: Abnormal   Collection Time: 04/21/18  7:10 PM  Result Value Ref Range   Lactic Acid, Venous 3.2 (HH) 0.5 - 1.9 mmol/L  Comprehensive metabolic panel     Status: Abnormal   Collection Time: 04/21/18  7:10 PM  Result Value Ref Range   Sodium 139 135 - 145 mmol/L   Potassium 4.2 3.5 - 5.1 mmol/L   Chloride 105 98 - 111 mmol/L   CO2 20 (L) 22 - 32 mmol/L   Glucose, Bld 142 (H) 70 - 99 mg/dL   BUN 34 (H) 8 - 23 mg/dL   Creatinine, Ser 1.71 (H) 0.61 - 1.24 mg/dL   Calcium 9.9 8.9 - 10.3 mg/dL   Total Protein 8.1 6.5 - 8.1 g/dL   Albumin 4.0 3.5 - 5.0 g/dL   AST 27 15 - 41 U/L   ALT 14 0 - 44 U/L   Alkaline Phosphatase 78 38 - 126 U/L   Total Bilirubin 0.7 0.3 - 1.2 mg/dL   GFR calc non Af Amer 36 (L) >60 mL/min   GFR calc Af Amer 42  (L) >60 mL/min   Anion gap 14 5 - 15  Troponin I     Status: Abnormal   Collection Time: 04/21/18  7:10 PM  Result Value Ref Range   Troponin I 0.03 (HH) <0.03 ng/mL  CBC WITH DIFFERENTIAL     Status: Abnormal   Collection Time: 04/21/18  7:10 PM  Result Value Ref Range   WBC 11.1 (H) 3.8 - 10.6 K/uL   RBC 5.61 4.40 - 5.90 MIL/uL   Hemoglobin 16.0 13.0 - 18.0 g/dL   HCT 47.5 40.0 - 52.0 %   MCV 84.8 80.0 - 100.0 fL   MCH 28.5 26.0 - 34.0 pg   MCHC 33.6 32.0 - 36.0 g/dL   RDW 17.2 (H) 11.5 - 14.5 %   Platelets 228 150 -  440 K/uL   Neutrophils Relative % 94 %   Neutro Abs 10.5 (H) 1.4 - 6.5 K/uL   Lymphocytes Relative 4 %   Lymphs Abs 0.5 (L) 1.0 - 3.6 K/uL   Monocytes Relative 1 %   Monocytes Absolute 0.1 (L) 0.2 - 1.0 K/uL   Eosinophils Relative 1 %   Eosinophils Absolute 0.1 0 - 0.7 K/uL   Basophils Relative 0 %   Basophils Absolute 0.0 0 - 0.1 K/uL  Urinalysis, Complete w Microscopic     Status: Abnormal   Collection Time: 04/21/18  7:10 PM  Result Value Ref Range   Color, Urine YELLOW (A) YELLOW   APPearance TURBID (A) CLEAR   Specific Gravity, Urine 1.011 1.005 - 1.030   pH 5.0 5.0 - 8.0   Glucose, UA NEGATIVE NEGATIVE mg/dL   Hgb urine dipstick LARGE (A) NEGATIVE   Bilirubin Urine NEGATIVE NEGATIVE   Ketones, ur NEGATIVE NEGATIVE mg/dL   Protein, ur 100 (A) NEGATIVE mg/dL   Nitrite NEGATIVE NEGATIVE   Leukocytes, UA LARGE (A) NEGATIVE   RBC / HPF >50 (H) 0 - 5 RBC/hpf   WBC, UA >50 (H) 0 - 5 WBC/hpf   Bacteria, UA RARE (A) NONE SEEN   Squamous Epithelial / LPF 0-5 0 - 5   WBC Clumps PRESENT    Mucus PRESENT    ____________________________________________  EKG My review and personal interpretation at Time: 19:12   Indication: sepsis  Rate: 134  Rhythm: afib with rvr Axis: normal Other: nonspecific st abn, no stemi ____________________________________________  RADIOLOGY  I personally reviewed all radiographic images ordered to evaluate for the above acute  complaints and reviewed radiology reports and findings.  These findings were personally discussed with the patient.  Please see medical record for radiology report.  ____________________________________________   PROCEDURES  Procedure(s) performed:  .Critical Care Performed by: Merlyn Lot, MD Authorized by: Merlyn Lot, MD   Critical care provider statement:    Critical care time (minutes):  35   Critical care time was exclusive of:  Separately billable procedures and treating other patients   Critical care was necessary to treat or prevent imminent or life-threatening deterioration of the following conditions:  Sepsis   Critical care was time spent personally by me on the following activities:  Development of treatment plan with patient or surrogate, discussions with consultants, evaluation of patient's response to treatment, examination of patient, obtaining history from patient or surrogate, ordering and performing treatments and interventions, ordering and review of laboratory studies, ordering and review of radiographic studies, pulse oximetry, re-evaluation of patient's condition and review of old charts      Critical Care performed: yes ____________________________________________   INITIAL IMPRESSION / [place] / ED COURSE  Pertinent labs & imaging results that were available during my care of the patient were reviewed by me and considered in my medical decision making (see chart for details).   DDX: Dehydration, sepsis, pna, uti, hypoglycemia, cva, drug effect, withdrawal, encephalitis   HERRON FERO is a 77 y.o. who presents to the ED with was as described above.  I am concerned for sepsis.  Seems less clinically consistent with allergic reaction.  He is protecting his airway at this time.  Ill-appearing.  Will initiate septic work-up.  Clinical Course as of Apr 21 2012  Thu Apr 21, 2018  1940 Patient with leukocytosis and fever to 104.   Presentation most clinically consistent with sepsis.  Will start on IV cefepime as symptoms  seem most clinically consistent with urosepsis.   [PR]  2011 Patient mildly tachycardic but it is improving with IV fluids now rate controlled.  Blood pressure stable.  Given his congestive heart failure with family stating that they would not want to escalate care or want him placed on mechanical ventilator will give IV hydration and resuscitation slowly.  Patient has received IV antibiotics.  Have discussed with the patient and available family all diagnostics and treatments performed thus far and all questions were answered to the best of my ability. The patient demonstrates understanding and agreement with plan.    [PR]    Clinical Course User Index [PR] Merlyn Lot, MD     As part of my medical decision making, I reviewed the following data within the [place] notes reviewed and incorporated, Labs reviewed, notes from prior ED visits.   ____________________________________________   FINAL CLINICAL IMPRESSION(S) / ED DIAGNOSES  Final diagnoses:  Sepsis, due to unspecified organism [facility])      NEW MEDICATIONS STARTED DURING THIS VISIT:  New Prescriptions   No medications on file     Note:  This document was prepared using Dragon voice recognition software and may include unintentional dictation errors.    Merlyn Lot, MD 04/21/18 2016

## 2018-04-21 NOTE — ED Triage Notes (Signed)
First Nurse Note:  Arrives with family with concerns of allergic reaction.  Patient started on Sulfa and Finastreride today.  First dose at 1200 today for UTI.  Family states that at around 1700 this evening, patient began to c/o chills, weakness, SOB.    Patient alert and oriented. Respirations regular and non labored.  NAD

## 2018-04-22 ENCOUNTER — Other Ambulatory Visit: Payer: Self-pay

## 2018-04-22 LAB — TSH: TSH: 1.394 u[IU]/mL (ref 0.350–4.500)

## 2018-04-22 LAB — HEMOGLOBIN A1C
Hgb A1c MFr Bld: 6.6 % — ABNORMAL HIGH (ref 4.8–5.6)
Mean Plasma Glucose: 142.72 mg/dL

## 2018-04-22 LAB — LACTIC ACID, PLASMA: Lactic Acid, Venous: 2.6 mmol/L (ref 0.5–1.9)

## 2018-04-22 MED ORDER — GUAIFENESIN 100 MG/5ML PO SOLN
5.0000 mL | ORAL | Status: DC | PRN
  Administered 2018-04-22 – 2018-04-23 (×4): 100 mg via ORAL
  Filled 2018-04-22 (×7): qty 5

## 2018-04-22 NOTE — Progress Notes (Signed)
Notified MD of pt with runs of pvc's 7 bts,  and 5 bts. In to check on pt. Pt resting comfortably. No new orders at this time. Will continue to monitor and assess.

## 2018-04-22 NOTE — Care Management (Signed)
Admitted with diagnosis of sepsis due to uti.  WBC mildly elevated at 11.1.  Lives with wife.  has rolling walker.  Was recently discharged from [facility] for nursing and physical therapy.  Current with his pcp and no issues accessing medical care or obtaining medications. Discussed during progression of need to mobilize patient to prevent decline in functional status

## 2018-04-22 NOTE — Progress Notes (Signed)
George Bolton    MR#:  [id number]  DATE OF BIRTH:  [birth year]  SUBJECTIVE:  feels better. Requesting to go home. He was admitted with fever chills and found to have UTI.  REVIEW OF SYSTEMS:   Review of Systems  Constitutional: Negative for chills, fever and weight loss.  HENT: Negative for ear discharge, ear pain and nosebleeds.   Eyes: Negative for blurred vision, pain and discharge.  Respiratory: Negative for sputum production, shortness of breath, wheezing and stridor.   Cardiovascular: Negative for chest pain, palpitations, orthopnea and PND.  Gastrointestinal: Negative for abdominal pain, diarrhea, nausea and vomiting.  Genitourinary: Negative for frequency and urgency.  Musculoskeletal: Negative for back pain and joint pain.  Neurological: Positive for weakness. Negative for sensory change, speech change and focal weakness.  Psychiatric/Behavioral: Negative for depression and hallucinations. The patient is not nervous/anxious.    Tolerating Diet:yesTolerating PT: ambulatory  DRUG ALLERGIES:   Allergies  Allergen Reactions  . Cortisone     VITALS:  Blood pressure 113/75, pulse 89, temperature 98.1 F (36.7 C), temperature source Oral, resp. rate 17, height 6' (1.829 m), weight 90.2 kg (198 lb 12.8 oz), SpO2 99 %.  PHYSICAL EXAMINATION:   Physical Exam  GENERAL:  77 y.o.-year-old patient lying in the bed with no acute distress.  EYES: Pupils equal, round, reactive to light and accommodation. No scleral icterus. Extraocular muscles intact.  HEENT: Head atraumatic, normocephalic. Oropharynx and nasopharynx clear.  NECK:  Supple, no jugular venous distention. No thyroid enlargement, no tenderness.  LUNGS: Normal breath sounds bilaterally, no wheezing, rales, rhonchi. No use of accessory muscles of respiration.  CARDIOVASCULAR: S1, S2 normal. No murmurs, rubs, or gallops.  ABDOMEN: Soft,  nontender, nondistended. Bowel sounds present. No organomegaly or mass.  EXTREMITIES: No cyanosis, clubbing or edema b/l.    NEUROLOGIC: Cranial nerves II through XII are intact. No focal Motor or sensory deficits b/l.   PSYCHIATRIC:  patient is alert and oriented x 3.  SKIN: No obvious rash, lesion, or ulcer.   LABORATORY PANEL:  CBC Recent Labs  Lab 04/21/18 1910  WBC 11.1*  HGB 16.0  HCT 47.5  PLT 228    Chemistries  Recent Labs  Lab 04/21/18 1910  NA 139  K 4.2  CL 105  CO2 20*  GLUCOSE 142*  BUN 34*  CREATININE 1.71*  CALCIUM 9.9  AST 27  ALT 14  ALKPHOS 78  BILITOT 0.7   Cardiac Enzymes Recent Labs  Lab 04/21/18 1910  TROPONINI 0.03*   RADIOLOGY:  Dg Chest Port 1 View  Result Date: 04/21/2018 CLINICAL DATA:  Allergic reaction EXAM: PORTABLE CHEST 1 VIEW COMPARISON:  None. FINDINGS: Cardiomegaly with mild aortic atherosclerosis. Interstitial edema is noted without pulmonary consolidation, effusion or pneumothorax. Lung volumes are slightly low in appearance. The patient's chin obscures the apices of the lungs. No acute osseous abnormality. IMPRESSION: Stable cardiomegaly with aortic atherosclerosis. Mild interstitial edema is redemonstrated. Electronically Signed   By: Ashley Royalty M.D.   On: 04/21/2018 19:50   ASSESSMENT AND PLAN:  George Bolton is a 77 y.o. male presents the ER via family.  Patient was at urology clinic today was recently started on Septra antibiotics.  Urinalysis today showed evidence of UTI.  Patient has had Klebsiella infection in the past.  No history of kidney stones and recent renal ultrasound showed no evidence of stone or hydronephrosis.  Family reports that patient  started complaining of chills will sit on the porch and started looking very pale as if he was about to pass ou  1.  Sepsis secondary to recurrent UTI: The patient meets criteria via tachycardia, fever and leukocytosis.  He is hemodynamically stable.  Follow blood and urine  cultures for growth and sensitivities. -IV cefepime. -BC negative -UC pending  2.  UTI: Present on admission.  Previous infections have been Klebsiella.  Continue cefepime  3.  Hypertension: Intermittent control acceptable for age; continue metoprolol  4.  CHF: Diastolic; chronic.  Continue furosemide  5.  BPH: Continue tamsulosin and finasteride -follows with Dr Erlene Quan  6.  Lipidemia: Continue statin therapy  7.  DVT prophylaxis: Heparin    Case discussed with Care Management/Social Worker. Management plans discussed with the patient, family and they are in agreement.  CODE STATUS: full  DVT Prophylaxis: heparin  TOTAL TIME TAKING CARE OF THIS PATIENT: *30 minutes.  >50% time spent on counselling and coordination of care  POSSIBLE D/C IN *1-2* DAYS, DEPENDING ON CLINICAL CONDITION.  Note: This dictation was prepared with Dragon dictation along with smaller phrase technology. Any transcriptional errors that result from this process are unintentional.  Fritzi Mandes M.D on 04/22/2018 at 1:45 PM  Between 7am to 6pm - Pager - [phone]  After 6pm go to [email or web address] - password [email or web address] [facility]  Office  [phone]  CC: Primary care physician; Denton Lank, MDPatient ID: George Bolton, male   DOB: 07/15/1941, 77 y.o.   MRN: [id number]

## 2018-04-22 NOTE — Progress Notes (Signed)
Patient reports that the air is very cold. Room is set to 85. RN offered blankets, he refused. RN took his temp it was 97.6 He said he is not cold but the air is cold and wants a new room.  RN said we do not have warmer rooms.  Patient requested more juice, so RN gave him 2 apple juices and ice per his request.  Phillis Knack, RN

## 2018-04-22 NOTE — Plan of Care (Signed)
  Problem: Clinical Measurements: Goal: Will remain free from infection Outcome: Progressing Goal: Diagnostic test results will improve Outcome: Progressing Goal: Respiratory complications will improve Outcome: Progressing   Problem: Activity: Goal: Risk for activity intolerance will decrease Outcome: Progressing   Problem: Safety: Goal: Ability to remain free from injury will improve Outcome: Progressing   Problem: Respiratory: Goal: Ability to maintain adequate ventilation will improve Outcome: Progressing

## 2018-04-22 NOTE — Progress Notes (Signed)
Pt to floor via stretcher from ED. Pt A&O. Telemetry monitor applied and called to CCMD. Oriented to room and call system.

## 2018-04-22 NOTE — Progress Notes (Signed)
Patient feels he has fluid on his lungs.  He asked for the doctor.  He says he is having problems breathing.  CCMD reported a run of [place].  O2 was 94-100.  MD was paged.  Phillis Knack, RN

## 2018-04-22 NOTE — H&P (Signed)
George Bolton is an 77 y.o. male.   Chief Complaint: Fever HPI: The patient with past medical history of atrial fibrillation, CHF and hypertension presents to the emergency department with subjective fever.  The patient was at urology clinic earlier and had evidence of a UTI.  He had been taking Septra but continued to feel diaphoretic and generally unwell.  Laboratory evaluation in the emergency department revealed mild leukocytosis as well as increased lactic acid.  He also met criteria for sepsis which prompted collection of blood cultures and initiation of of broad-spectrum antibiotics prior to the emergency department staff calling the hospitalist service for admission.  Past Medical History:  Diagnosis Date  . Chronic atrial fibrillation ([place])    a. Afib dates back to at least 2002 when reviewing prior EKG with EKGs from 2014 onward showing Afib; b. CHADS2VASc at least 4 (CHF, HTN, age x 2)  . Chronic diastolic CHF (congestive heart failure) ([place])    a. TTE 5/19: EF 70-75%, mod LVH, near complete obliteration of the mid and apical LV cavity (can be seen in apical hypertrophic CM), mildly dilated LA, RV cavity size nl, RV wall thickness nl, RVSF mildly reduced, mildly dilated RA; b. 02/2018 Limited echo w/ definity: EF 50-65%, no apical hypertrophic CM.  Marland Kitchen Gout   . Hypertension     Past Surgical History:  Procedure Laterality Date  . CHOLECYSTECTOMY N/A 01/13/2017   Procedure: LAPAROSCOPIC CHOLECYSTECTOMY;  Surgeon: Jules Husbands, MD;  Location: [facility] ORS;  Service: General;  Laterality: N/A;    Family History  Problem Relation Age of Onset  . Stroke Mother    Social History:  reports that he has never smoked. He has never used smokeless tobacco. He reports that he does not drink alcohol or use drugs.  Allergies:  Allergies  Allergen Reactions  . Cortisone     Medications Prior to Admission  Medication Sig Dispense Refill  . aspirin 325 MG tablet Take 325 mg by mouth daily.     Marland Kitchen atorvastatin (LIPITOR) 40 MG tablet Take 1 tablet by mouth daily.    Marland Kitchen diltiazem (CARDIZEM CD) 180 MG 24 hr capsule Take 1 capsule (180 mg total) by mouth daily. 30 capsule 2  . feeding supplement, ENSURE ENLIVE, (ENSURE ENLIVE) LIQD Take 237 mLs by mouth 2 (two) times daily between meals. 237 mL 12  . finasteride (PROSCAR) 5 MG tablet Take 1 tablet (5 mg total) by mouth daily. 30 tablet 11  . furosemide (LASIX) 20 MG tablet Take 1 tablet (20 mg total) by mouth daily. 30 tablet 1  . metoprolol tartrate (LOPRESSOR) 50 MG tablet Take 1 tablet (50 mg total) by mouth 2 (two) times daily. 60 tablet 2  . sulfamethoxazole-trimethoprim (BACTRIM DS,SEPTRA DS) 800-160 MG tablet Take 1 tablet by mouth every 12 (twelve) hours. 14 tablet 0  . tamsulosin (FLOMAX) 0.4 MG CAPS capsule Take 1 capsule (0.4 mg total) by mouth daily. 30 capsule 11    Results for orders placed or performed during the hospital encounter of 04/21/18 (from the past 48 hour(s))  Lactic acid, plasma     Status: Abnormal   Collection Time: 04/21/18  7:10 PM  Result Value Ref Range   Lactic Acid, Venous 3.2 (HH) 0.5 - 1.9 mmol/L    Comment: CRITICAL RESULT CALLED TO, READ BACK BY AND VERIFIED WITH KASEY ROBERST @2004  04/21/18 AKT Performed at [facility], [address] [place], [place] [address]   Comprehensive metabolic panel  Status: Abnormal   Collection Time: 04/21/18  7:10 PM  Result Value Ref Range   Sodium 139 135 - 145 mmol/L   Potassium 4.2 3.5 - 5.1 mmol/L   Chloride 105 98 - 111 mmol/L    Comment: Please note change in reference range.   CO2 20 (L) 22 - 32 mmol/L   Glucose, Bld 142 (H) 70 - 99 mg/dL    Comment: Please note change in reference range.   BUN 34 (H) 8 - 23 mg/dL    Comment: Please note change in reference range.   Creatinine, Ser 1.71 (H) 0.61 - 1.24 mg/dL   Calcium 9.9 8.9 - 10.3 mg/dL   Total Protein 8.1 6.5 - 8.1 g/dL   Albumin 4.0 3.5 - 5.0 g/dL   AST 27 15 - 41 U/L   ALT 14 0 -  44 U/L    Comment: Please note change in reference range.   Alkaline Phosphatase 78 38 - 126 U/L   Total Bilirubin 0.7 0.3 - 1.2 mg/dL   GFR calc non Af Amer 36 (L) >60 mL/min   GFR calc Af Amer 42 (L) >60 mL/min    Comment: (NOTE) The eGFR has been calculated using the CKD EPI equation. This calculation has not been validated in all clinical situations. eGFR's persistently <60 mL/min signify possible Chronic Kidney Disease.    Anion gap 14 5 - 15    Comment: Performed at [facility], [address] [place], [place] [address]  Troponin I     Status: Abnormal   Collection Time: 04/21/18  7:10 PM  Result Value Ref Range   Troponin I 0.03 (HH) <0.03 ng/mL    Comment: CRITICAL RESULT CALLED TO, READ BACK BY AND VERIFIED WITH KASEY ROBERTS @2004  04/21/18 AKT Performed at [facility], [address] [place], [place] [address]   CBC WITH DIFFERENTIAL     Status: Abnormal   Collection Time: 04/21/18  7:10 PM  Result Value Ref Range   WBC 11.1 (H) 3.8 - 10.6 K/uL   RBC 5.61 4.40 - 5.90 MIL/uL   Hemoglobin 16.0 13.0 - 18.0 g/dL   HCT 47.5 40.0 - 52.0 %   MCV 84.8 80.0 - 100.0 fL   MCH 28.5 26.0 - 34.0 pg   MCHC 33.6 32.0 - 36.0 g/dL   RDW 17.2 (H) 11.5 - 14.5 %   Platelets 228 150 - 440 K/uL   Neutrophils Relative % 94 %   Neutro Abs 10.5 (H) 1.4 - 6.5 K/uL   Lymphocytes Relative 4 %   Lymphs Abs 0.5 (L) 1.0 - 3.6 K/uL   Monocytes Relative 1 %   Monocytes Absolute 0.1 (L) 0.2 - 1.0 K/uL   Eosinophils Relative 1 %   Eosinophils Absolute 0.1 0 - 0.7 K/uL   Basophils Relative 0 %   Basophils Absolute 0.0 0 - 0.1 K/uL    Comment: Performed at [facility], [address] [place], [place] [address]  Urinalysis, Complete w Microscopic     Status: Abnormal   Collection Time: 04/21/18  7:10 PM  Result Value Ref Range   Color, Urine YELLOW (A) YELLOW   APPearance TURBID (A) CLEAR   Specific Gravity, Urine 1.011 1.005 - 1.030   pH 5.0 5.0 - 8.0    Glucose, UA NEGATIVE NEGATIVE mg/dL   Hgb urine dipstick LARGE (A) NEGATIVE   Bilirubin Urine NEGATIVE NEGATIVE   Ketones, ur NEGATIVE NEGATIVE mg/dL   Protein, ur 100 (A) NEGATIVE  mg/dL   Nitrite NEGATIVE NEGATIVE   Leukocytes, UA LARGE (A) NEGATIVE   RBC / HPF >50 (H) 0 - 5 RBC/hpf   WBC, UA >50 (H) 0 - 5 WBC/hpf   Bacteria, UA RARE (A) NONE SEEN   Squamous Epithelial / LPF 0-5 0 - 5   WBC Clumps PRESENT    Mucus PRESENT     Comment: Performed at [facility], [address] [place], [place] [address]  TSH     Status: None   Collection Time: 04/21/18  7:17 PM  Result Value Ref Range   TSH 1.394 0.350 - 4.500 uIU/mL    Comment: Performed by a 3rd Generation assay with a functional sensitivity of <=0.01 uIU/mL. Performed at [facility], [address] [place], [place] [address]   Lactic acid, plasma     Status: Abnormal   Collection Time: 04/21/18  9:16 PM  Result Value Ref Range   Lactic Acid, Venous 2.1 (HH) 0.5 - 1.9 mmol/L    Comment: CRITICAL RESULT CALLED TO, READ BACK BY AND VERIFIED WITH KASEY ROBERTS @2244  04/21/18 AKT Performed at [facility], [address] [place], [place] [address]   Lactic acid, plasma     Status: Abnormal   Collection Time: 04/22/18  2:18 AM  Result Value Ref Range   Lactic Acid, Venous 2.6 (HH) 0.5 - 1.9 mmol/L    Comment: CRITICAL RESULT CALLED TO, READ BACK BY AND VERIFIED WITH MICHELLE ROGERS AT 0923 ON 04/22/2018 JJB Performed at [facility], [address] [place], [place] [address]    Dg Chest Port 1 View  Result Date: 04/21/2018 CLINICAL DATA:  Allergic reaction EXAM: PORTABLE CHEST 1 VIEW COMPARISON:  None. FINDINGS: Cardiomegaly with mild aortic atherosclerosis. Interstitial edema is noted without pulmonary consolidation, effusion or pneumothorax. Lung volumes are slightly low in appearance. The patient's chin obscures the apices of the lungs. No acute osseous abnormality. IMPRESSION: Stable  cardiomegaly with aortic atherosclerosis. Mild interstitial edema is redemonstrated. Electronically Signed   By: Ashley Royalty M.D.   On: 04/21/2018 19:50    Review of Systems  Constitutional: Positive for chills and fever.  HENT: Negative for sore throat and tinnitus.   Eyes: Negative for blurred vision and redness.  Respiratory: Negative for cough and shortness of breath.   Cardiovascular: Negative for chest pain, palpitations, orthopnea and PND.  Gastrointestinal: Negative for abdominal pain, diarrhea, nausea and vomiting.  Genitourinary: Negative for dysuria, frequency and urgency.  Musculoskeletal: Negative for joint pain and myalgias.  Skin: Negative for rash.       No lesions  Neurological: Negative for speech change, focal weakness and weakness.  Endo/Heme/Allergies: Does not bruise/bleed easily.       No temperature intolerance  Psychiatric/Behavioral: Negative for depression and suicidal ideas.    Blood pressure (!) 147/75, pulse (!) 102, temperature 97.8 F (36.6 C), temperature source Oral, resp. rate 17, height 6' (1.829 m), weight 90.2 kg (198 lb 12.8 oz), SpO2 94 %. Physical Exam  Vitals reviewed. Constitutional: He is oriented to person, place, and time. He appears well-developed and well-nourished. No distress.  HENT:  Head: Normocephalic and atraumatic.  Mouth/Throat: Oropharynx is clear and moist.  Eyes: Pupils are equal, round, and reactive to light. Conjunctivae and EOM are normal. No scleral icterus.  Neck: Normal range of motion. Neck supple. No JVD present. No tracheal deviation present. No thyromegaly present.  Cardiovascular: Regular rhythm and normal heart sounds. Tachycardia present. Exam reveals no gallop  and no friction rub.  No murmur heard. Respiratory: Effort normal and breath sounds normal. No respiratory distress.  GI: Soft. Bowel sounds are normal. He exhibits no distension. There is no tenderness.  Genitourinary:  Genitourinary Comments: Deferred   Musculoskeletal: Normal range of motion. He exhibits no edema.  Lymphadenopathy:    He has no cervical adenopathy.  Neurological: He is alert and oriented to person, place, and time. No cranial nerve deficit.  Skin: Skin is warm and dry. No rash noted. No erythema.  Psychiatric: He has a normal mood and affect. His behavior is normal. Judgment and thought content normal.     Assessment/Plan This is a 77 year old male admitted for sepsis. 1.  Sepsis: The patient meets criteria via tachycardia, fever and leukocytosis.  He is hemodynamically stable.  Follow blood and urine cultures for growth and sensitivities. 2.  UTI: Present on admission.  Previous infections have been Klebsiella.  Continue cefepime 3.  Hypertension: Intermittent control acceptable for age; continue metoprolol 4.  CHF: Diastolic; chronic.  Continue furosemide 5.  BPH: Continue tamsulosin and finasteride 6.  Lipidemia: Continue statin therapy 7.  DVT prophylaxis: Heparin 8.  GI prophylaxis: None The patient is a full code.  Time spent on admission orders and patient care approximately 45 minutes  Harrie Foreman, MD 04/22/2018, 5:25 AM

## 2018-04-23 LAB — BASIC METABOLIC PANEL
Anion gap: 10 (ref 5–15)
BUN: 29 mg/dL — ABNORMAL HIGH (ref 8–23)
CO2: 18 mmol/L — ABNORMAL LOW (ref 22–32)
Calcium: 9 mg/dL (ref 8.9–10.3)
Chloride: 109 mmol/L (ref 98–111)
Creatinine, Ser: 1.62 mg/dL — ABNORMAL HIGH (ref 0.61–1.24)
GFR calc Af Amer: 45 mL/min — ABNORMAL LOW (ref >60)
GFR calc non Af Amer: 39 mL/min — ABNORMAL LOW (ref >60)
Glucose, Bld: 151 mg/dL — ABNORMAL HIGH (ref 70–99)
Potassium: 4 mmol/L (ref 3.5–5.1)
Sodium: 137 mmol/L (ref 135–145)

## 2018-04-23 LAB — CBC WITH DIFFERENTIAL/PLATELET
Basophils Absolute: 0.1 10*3/uL (ref 0–0.1)
Basophils Relative: 1 %
Eosinophils Absolute: 0.2 10*3/uL (ref 0–0.7)
Eosinophils Relative: 1 %
HCT: 36.7 % — ABNORMAL LOW (ref 40.0–52.0)
Hemoglobin: 12.3 g/dL — ABNORMAL LOW (ref 13.0–18.0)
Lymphocytes Relative: 6 %
Lymphs Abs: 0.9 10*3/uL — ABNORMAL LOW (ref 1.0–3.6)
MCH: 28.2 pg (ref 26.0–34.0)
MCHC: 33.4 g/dL (ref 32.0–36.0)
MCV: 84.5 fL (ref 80.0–100.0)
Monocytes Absolute: 0.8 10*3/uL (ref 0.2–1.0)
Monocytes Relative: 5 %
Neutro Abs: 14.9 10*3/uL — ABNORMAL HIGH (ref 1.4–6.5)
Neutrophils Relative %: 87 %
Platelets: 153 10*3/uL (ref 150–440)
RBC: 4.35 MIL/uL — ABNORMAL LOW (ref 4.40–5.90)
RDW: 17.5 % — ABNORMAL HIGH (ref 11.5–14.5)
WBC: 16.9 10*3/uL — ABNORMAL HIGH (ref 3.8–10.6)

## 2018-04-23 MED ORDER — ENOXAPARIN SODIUM 30 MG/0.3ML ~~LOC~~ SOLN: 30.0000 mg | SUBCUTANEOUS | Status: DC

## 2018-04-23 MED ORDER — ENOXAPARIN SODIUM 40 MG/0.4ML ~~LOC~~ SOLN
40.0000 mg | SUBCUTANEOUS | Status: DC
  Administered 2018-04-23: 40 mg via SUBCUTANEOUS
  Filled 2018-04-23: qty 0.4

## 2018-04-23 NOTE — Plan of Care (Signed)
  Problem: Education: Goal: Knowledge of General Education information will improve Outcome: Progressing   Problem: Health Behavior/Discharge Planning: Goal: Ability to manage health-related needs will improve Outcome: Progressing   Problem: Safety: Goal: Ability to remain free from injury will improve Outcome: Progressing   

## 2018-04-23 NOTE — Progress Notes (Signed)
[place] [facility] [place] NAME: George Bolton    MR#:  [id number]  DATE OF BIRTH:  Oct 12, 1941  SUBJECTIVE:  Dry cough. Feels weak  Chronic lower extremity edema  REVIEW OF SYSTEMS:   Review of Systems  Constitutional: Negative for chills, fever and weight loss.  HENT: Negative for ear discharge, ear pain and nosebleeds.   Eyes: Negative for blurred vision, pain and discharge.  Respiratory: Negative for sputum production, shortness of breath, wheezing and stridor.   Cardiovascular: Negative for chest pain, palpitations, orthopnea and PND.  Gastrointestinal: Negative for abdominal pain, diarrhea, nausea and vomiting.  Genitourinary: Negative for frequency and urgency.  Musculoskeletal: Negative for back pain and joint pain.  Neurological: Positive for weakness. Negative for sensory change, speech change and focal weakness.  Psychiatric/Behavioral: Negative for depression and hallucinations. The patient is not nervous/anxious.    DRUG ALLERGIES:   Allergies  Allergen Reactions  . Cortisone     VITALS:  Blood pressure 134/86, pulse 100, temperature 98.7 F (37.1 C), temperature source Oral, resp. rate 14, height 6' (1.829 m), weight 91.4 kg (201 lb 9.6 oz), SpO2 96 %.  PHYSICAL EXAMINATION:   Physical Exam  GENERAL:  77 y.o.-year-old patient lying in the bed with no acute distress.  EYES: Pupils equal, round, reactive to light and accommodation. No scleral icterus. Extraocular muscles intact.  HEENT: Head atraumatic, normocephalic. Oropharynx and nasopharynx clear.  NECK:  Supple, no jugular venous distention. No thyroid enlargement, no tenderness.  LUNGS: Normal breath sounds bilaterally, no wheezing, rales, rhonchi. No use of accessory muscles of respiration.  CARDIOVASCULAR: S1, S2 normal. No murmurs, rubs, or gallops.  ABDOMEN: Soft, nontender, nondistended. Bowel sounds present. No organomegaly or mass.  EXTREMITIES: No  cyanosis, clubbing . LE edema NEUROLOGIC: Cranial nerves II through XII are intact. No focal Motor or sensory deficits b/l.   PSYCHIATRIC:  patient is alert and oriented x 3.  SKIN: No obvious rash, lesion, or ulcer.   LABORATORY PANEL:  CBC Recent Labs  Lab 04/23/18 1000  WBC 16.9*  HGB 12.3*  HCT 36.7*  PLT 153    Chemistries  Recent Labs  Lab 04/21/18 1910 04/23/18 1000  NA 139 137  K 4.2 4.0  CL 105 109  CO2 20* 18*  GLUCOSE 142* 151*  BUN 34* 29*  CREATININE 1.71* 1.62*  CALCIUM 9.9 9.0  AST 27  --   ALT 14  --   ALKPHOS 78  --   BILITOT 0.7  --    Cardiac Enzymes Recent Labs  Lab 04/21/18 1910  TROPONINI 0.03*   RADIOLOGY:  Dg Chest Port 1 View  Result Date: 04/21/2018 CLINICAL DATA:  Allergic reaction EXAM: PORTABLE CHEST 1 VIEW COMPARISON:  None. FINDINGS: Cardiomegaly with mild aortic atherosclerosis. Interstitial edema is noted without pulmonary consolidation, effusion or pneumothorax. Lung volumes are slightly low in appearance. The patient's chin obscures the apices of the lungs. No acute osseous abnormality. IMPRESSION: Stable cardiomegaly with aortic atherosclerosis. Mild interstitial edema is redemonstrated. Electronically Signed   By: Ashley Royalty M.D.   On: 04/21/2018 19:50   ASSESSMENT AND PLAN:  George Bolton is a 77 y.o. male presents the ER via family.  Patient was at urology clinic today was recently started on Septra antibiotics.  Urinalysis today showed evidence of UTI.  Patient has had Klebsiella infection in the past.  No history of kidney stones and recent renal ultrasound showed no evidence of stone or  hydronephrosis.  Family reports that patient started complaining of chills will sit on the porch and started looking very pale as if he was about to pass ou  1. Sepsis with UTI On Iv cefepime Ucx pending WBC still elevated  3.  Hypertension continue metoprolol  4.  CHF: Diastolic; chronic.  Continue furosemide  5.  BPH: Continue  tamsulosin and finasteride -follows with Dr Erlene Quan  6.  Lipidemia: Continue statin therapy  7.  DVT prophylaxis: Lovenox  Case discussed with Care Management/Social Worker. Management plans discussed with the patient, family and they are in agreement.  CODE STATUS: full  DVT Prophylaxis: heparin  TOTAL TIME TAKING CARE OF THIS PATIENT: 30 minutes.   POSSIBLE D/C IN 1-2 DAYS, DEPENDING ON CLINICAL CONDITION.  Note: This dictation was prepared with Dragon dictation along with smaller phrase technology. Any transcriptional errors that result from this process are unintentional.  Neita Carp M.D on 04/23/2018 at 11:48 AM  Between 7am to 6pm - Pager - [phone]  After 6pm go to [email or web address] - password [email or web address] [facility]  Office  [phone]  CC: Primary care physician; Denton Lank, MDPatient ID: George Bolton, male   DOB: [birth year], 77 y.o.   MRN: [id number]

## 2018-04-23 NOTE — Plan of Care (Signed)
Voiding without difficulty.  Requesting Lasix dose.  Informed Lasix scheduled only once daily.

## 2018-04-23 NOTE — Progress Notes (Signed)
CCMD just called and reported that pt had a run of 8 beats of V-tach. Pt asymptomatic. VSS. Notify prime. Awaiting call back. Will continue to monitor.

## 2018-04-24 LAB — URINE CULTURE: Culture: >=100000 — AB

## 2018-04-24 LAB — CBC WITH DIFFERENTIAL/PLATELET
Basophils Absolute: 0 10*3/uL (ref 0–0.1)
Basophils Relative: 0 %
Eosinophils Absolute: 0.1 10*3/uL (ref 0–0.7)
Eosinophils Relative: 1 %
HCT: 36.3 % — ABNORMAL LOW (ref 40.0–52.0)
Hemoglobin: 12.2 g/dL — ABNORMAL LOW (ref 13.0–18.0)
Lymphocytes Relative: 8 %
Lymphs Abs: 0.9 10*3/uL — ABNORMAL LOW (ref 1.0–3.6)
MCH: 28.1 pg (ref 26.0–34.0)
MCHC: 33.7 g/dL (ref 32.0–36.0)
MCV: 83.5 fL (ref 80.0–100.0)
Monocytes Absolute: 0.6 10*3/uL (ref 0.2–1.0)
Monocytes Relative: 6 %
Neutro Abs: 9.9 10*3/uL — ABNORMAL HIGH (ref 1.4–6.5)
Neutrophils Relative %: 85 %
Platelets: 138 10*3/uL — ABNORMAL LOW (ref 150–440)
RBC: 4.35 MIL/uL — ABNORMAL LOW (ref 4.40–5.90)
RDW: 17.5 % — ABNORMAL HIGH (ref 11.5–14.5)
WBC: 11.6 10*3/uL — ABNORMAL HIGH (ref 3.8–10.6)

## 2018-04-24 MED ORDER — CEPHALEXIN 250 MG PO CAPS: 250.0000 mg | ORAL_CAPSULE | Freq: Three times a day (TID) | ORAL | 0 refills | Status: AC

## 2018-04-24 MED ORDER — FUROSEMIDE 40 MG PO TABS
60.0000 mg | ORAL_TABLET | Freq: Every day | ORAL | Status: DC
  Administered 2018-04-24: 60 mg via ORAL
  Filled 2018-04-24: qty 1

## 2018-04-24 MED ORDER — FUROSEMIDE 40 MG PO TABS: 40.0000 mg | ORAL_TABLET | Freq: Every day | ORAL | 0 refills | Status: DC

## 2018-04-24 NOTE — Progress Notes (Signed)
RN removed patient's IVs and did education about the importance of medications and taking them exactly as prescribed.    Patient's family also understood the education and plans to encourage him to take his medicines.  Patient will go home with his wife in a private vehicle.    Phillis Knack, RN

## 2018-04-24 NOTE — Discharge Instructions (Signed)
°-   Daily fluids < 2 liters. °- Low salt diet °- Check weight everyday and keep log. Take to your doctors appt. °- Take extra dose of lasix if you gain more than 3 pounds weight. ° ° °

## 2018-04-25 LAB — CULTURE, URINE COMPREHENSIVE

## 2018-04-26 LAB — CULTURE, BLOOD (ROUTINE X 2)
Culture: NO GROWTH
Culture: NO GROWTH
Special Requests: ADEQUATE
Special Requests: ADEQUATE

## 2018-05-02 ENCOUNTER — Telehealth: Payer: Self-pay

## 2018-05-02 NOTE — Telephone Encounter (Signed)
Flagged on EMMI report for not having a follow up appointment and having other questions or problems.  Called and spoke with patient.  He mentioned he is doing well and knows to follow up with his PCP.  He said he hasn't called to schedule the appointment, but will do so.  Per his AVS, a follow up with Dr. Donivan Scull office also needed.  Relayed this to patient and encouraged him to call to schedule an appointment.  He is aware that he sees Otila Kluver with the [facility] in September.  No other questions or concerns currently.  I thanked him for his time.

## 2018-05-05 NOTE — Discharge Summary (Signed)
[facility] NAME: [facility]    MR#:  [id number]  DATE OF BIRTH:  [birth year]  DATE OF ADMISSION:  04/21/2018 ADMITTING PHYSICIAN: Harrie Foreman, MD  DATE OF DISCHARGE: 04/24/2018  1:25 PM  PRIMARY CARE PHYSICIAN: Denton Lank, MD   ADMISSION DIAGNOSIS:  Sepsis, due to unspecified organism [facility]) [A41.9]  DISCHARGE DIAGNOSIS:  Active Problems:   Sepsis ([place])   SECONDARY DIAGNOSIS:   Past Medical History:  Diagnosis Date  . Chronic atrial fibrillation ([place])    a. Afib dates back to at least 2002 when reviewing prior EKG with EKGs from 2014 onward showing Afib; b. CHADS2VASc at least 4 (CHF, HTN, age x 2)  . Chronic diastolic CHF (congestive heart failure) ([place])    a. TTE 5/19: EF 70-75%, mod LVH, near complete obliteration of the mid and apical LV cavity (can be seen in apical hypertrophic CM), mildly dilated LA, RV cavity size nl, RV wall thickness nl, RVSF mildly reduced, mildly dilated RA; b. 02/2018 Limited echo w/ definity: EF 50-65%, no apical hypertrophic CM.  Marland Kitchen Gout   . Hypertension      ADMITTING HISTORY  Chief Complaint: Fever HPI: The patient with past medical history of atrial fibrillation, CHF and hypertension presents to the emergency department with subjective fever.  The patient was at urology clinic earlier and had evidence of a UTI.  He had been taking Septra but continued to feel diaphoretic and generally unwell.  Laboratory evaluation in the emergency department revealed mild leukocytosis as well as increased lactic acid.  He also met criteria for sepsis which prompted collection of blood cultures and initiation of of broad-spectrum antibiotics prior to the emergency department staff calling the hospitalist service for admission.    HOSPITAL COURSE:   *Sepsis present on admission secondary to Klebsiella UTI Initially started on IV cefepime.  Change to Keflex at discharge.  Afebrile and decreased WBC by  day of discharge.  No dysuria.  *Hypertension.  Continued on metoprolol at home  *Chronic diastolic congestive heart failure stable.  Continued on home dose of oral Lasix.  BPH.  On Flomax and finasteride follows with urology as outpatient  Patient stable for discharge home with oral Keflex for his Klebsiella UTI.  CONSULTS OBTAINED:    DRUG ALLERGIES:   Allergies  Allergen Reactions  . Cortisone     DISCHARGE MEDICATIONS:   Allergies as of 04/24/2018      Reactions   Cortisone       Medication List    STOP taking these medications   sulfamethoxazole-trimethoprim 800-160 MG tablet Commonly known as:  BACTRIM DS,SEPTRA DS     TAKE these medications   aspirin 325 MG tablet Take 325 mg by mouth daily.   atorvastatin 40 MG tablet Commonly known as:  LIPITOR Take 1 tablet by mouth daily.   diltiazem 180 MG 24 hr capsule Commonly known as:  CARDIZEM CD Take 1 capsule (180 mg total) by mouth daily.   feeding supplement (ENSURE ENLIVE) Liqd Take 237 mLs by mouth 2 (two) times daily between meals.   finasteride 5 MG tablet Commonly known as:  PROSCAR Take 1 tablet (5 mg total) by mouth daily.   furosemide 40 MG tablet Commonly known as:  LASIX Take 1 tablet (40 mg total) by mouth daily. What changed:    medication strength  how much to take   metoprolol tartrate 50 MG tablet Commonly known as:  LOPRESSOR Take 1  tablet (50 mg total) by mouth 2 (two) times daily.   tamsulosin 0.4 MG Caps capsule Commonly known as:  FLOMAX Take 1 capsule (0.4 mg total) by mouth daily.     ASK your doctor about these medications   cephALEXin 250 MG capsule Commonly known as:  KEFLEX Take 1 capsule (250 mg total) by mouth 3 (three) times daily for 6 days. Ask about: Should I take this medication?       Today   VITAL SIGNS:  Blood pressure (!) 151/92, pulse 96, temperature 98.3 F (36.8 C), temperature source Oral, resp. rate 18, height 6' (1.829 m), weight 91.4 kg  (201 lb 6.4 oz), SpO2 92 %.  I/O:  No intake or output data in the 24 hours ending 05/05/18 1627  PHYSICAL EXAMINATION:  Physical Exam  GENERAL:  77 y.o.-year-old patient lying in the bed with no acute distress.  LUNGS: Normal breath sounds bilaterally, no wheezing, rales,rhonchi or crepitation. No use of accessory muscles of respiration.  CARDIOVASCULAR: S1, S2 normal. No murmurs, rubs, or gallops.  ABDOMEN: Soft, non-tender, non-distended. Bowel sounds present. No organomegaly or mass.  NEUROLOGIC: Moves all 4 extremities. PSYCHIATRIC: The patient is alert and oriented x 3.  SKIN: No obvious rash, lesion, or ulcer.   DATA REVIEW:   CBC No results for input(s): WBC, HGB, HCT, PLT in the last 168 hours.  Chemistries  No results for input(s): NA, K, CL, CO2, GLUCOSE, BUN, CREATININE, CALCIUM, MG, AST, ALT, ALKPHOS, BILITOT in the last 168 hours.  Invalid input(s): GFRCGP  Cardiac Enzymes No results for input(s): TROPONINI in the last 168 hours.  Microbiology Results  Results for orders placed or performed during the hospital encounter of 04/21/18  Blood Culture (routine x 2)     Status: None   Collection Time: 04/21/18  7:10 PM  Result Value Ref Range Status   Specimen Description BLOOD BLOOD RIGHT HAND  Final   Special Requests   Final    BOTTLES DRAWN AEROBIC AND ANAEROBIC Blood Culture adequate volume   Culture   Final    NO GROWTH 5 DAYS Performed at [facility], [address] [place], [place] [address]    Report Status 04/26/2018 FINAL  Final  Urine culture     Status: Abnormal   Collection Time: 04/21/18  7:10 PM  Result Value Ref Range Status   Specimen Description   Final    URINE, RANDOM Performed at [facility], [address] [place], [place] [address]    Special Requests   Final    NONE Performed at [facility], [address] [place], [place] [address]    Culture >=100,000 COLONIES/mL KLEBSIELLA PNEUMONIAE (A)   Final   Report Status 04/24/2018 FINAL  Final   Organism ID, Bacteria KLEBSIELLA PNEUMONIAE (A)  Final      Susceptibility   Klebsiella pneumoniae - MIC*    AMPICILLIN RESISTANT Resistant     CEFAZOLIN <=4 SENSITIVE Sensitive     CEFTRIAXONE <=1 SENSITIVE Sensitive     CIPROFLOXACIN <=0.25 SENSITIVE Sensitive     GENTAMICIN <=1 SENSITIVE Sensitive     IMIPENEM <=0.25 SENSITIVE Sensitive     NITROFURANTOIN <=16 SENSITIVE Sensitive     TRIMETH/SULFA <=20 SENSITIVE Sensitive     AMPICILLIN/SULBACTAM 4 SENSITIVE Sensitive     PIP/TAZO 8 SENSITIVE Sensitive     Extended ESBL NEGATIVE Sensitive     * >=100,000 COLONIES/mL KLEBSIELLA PNEUMONIAE  Blood Culture (routine x 2)  Status: None   Collection Time: 04/21/18  7:15 PM  Result Value Ref Range Status   Specimen Description BLOOD RIGHT ANTECUBITAL  Final   Special Requests   Final    BOTTLES DRAWN AEROBIC AND ANAEROBIC Blood Culture adequate volume   Culture   Final    NO GROWTH 5 DAYS Performed at [facility], [address], [place], [place] [address]    Report Status 04/26/2018 FINAL  Final    RADIOLOGY:  No results found.  Follow up with PCP in 1 week.  Management plans discussed with the patient, family and they are in agreement.  CODE STATUS:  Code Status History    Date Active Date Inactive Code Status Order ID Comments User Context   04/21/2018 2321 04/24/2018 1631 Full Code [id number]  Harrie Foreman, MD Inpatient   02/28/2018 1715 03/04/2018 1925 Full Code [id number]  Hillary Bow, MD ED   01/11/2017 1752 01/15/2017 1901 Full Code [id number]  Fritzi Mandes, MD Inpatient      TOTAL TIME TAKING CARE OF THIS PATIENT ON DAY OF DISCHARGE: more than 30 minutes.   Neita Carp M.D on 05/05/2018 at 4:27 PM  Between 7am to 6pm - Pager - [phone]  After 6pm go to [email or web address] - password [email or web address] [facility]  Office  [phone]  CC: Primary care physician; Denton Lank,  MD  Note: This dictation was prepared with Dragon dictation along with smaller phrase technology. Any transcriptional errors that result from this process are unintentional.

## 2018-06-09 ENCOUNTER — Encounter: Payer: Self-pay | Admitting: Urology

## 2018-06-09 ENCOUNTER — Ambulatory Visit (INDEPENDENT_AMBULATORY_CARE_PROVIDER_SITE_OTHER): Payer: Medicare Other | Admitting: Urology

## 2018-06-09 VITALS — BP 174/79 | HR 120 | Ht 72.0 in | Wt 193.0 lb

## 2018-06-09 DIAGNOSIS — N138 Other obstructive and reflux uropathy: Secondary | ICD-10-CM | POA: Diagnosis not present

## 2018-06-09 DIAGNOSIS — R3129 Other microscopic hematuria: Secondary | ICD-10-CM | POA: Diagnosis not present

## 2018-06-09 DIAGNOSIS — R972 Elevated prostate specific antigen [PSA]: Secondary | ICD-10-CM | POA: Diagnosis not present

## 2018-06-09 DIAGNOSIS — N401 Enlarged prostate with lower urinary tract symptoms: Secondary | ICD-10-CM

## 2018-06-09 LAB — URINALYSIS, COMPLETE
Bilirubin, UA: NEGATIVE
Glucose, UA: NEGATIVE
Ketones, UA: NEGATIVE
Leukocytes, UA: NEGATIVE
Nitrite, UA: NEGATIVE
Specific Gravity, UA: >1.03 — ABNORMAL HIGH (ref 1.005–1.030)
Urobilinogen, Ur: 0.2 mg/dL (ref 0.2–1.0)
pH, UA: 5.5 (ref 5.0–7.5)

## 2018-06-09 LAB — MICROSCOPIC EXAMINATION: Epithelial Cells (non renal): NONE SEEN /hpf (ref 0–10)

## 2018-06-09 LAB — BLADDER SCAN AMB NON-IMAGING

## 2018-06-09 NOTE — Progress Notes (Signed)
06/09/2018 3:53 PM   George Bolton [birth year] [id number]  Referring provider: Denton Lank, MD [address] [place], [place] [address]  Chief Complaint  Patient presents with  . Benign Prostatic Hypertrophy    1 month follow up    HPI: 77 year old male who presented to our office on 04/21/2018 with elevated PSA and a urinary tract infection here today for routine follow-up.  On the day he was seen and evaluated, his urine was frankly positive he ultimately grew Klebsiella he was prescribed Bactrim.  Later that day, he presented to the emergency room with signs and symptoms of sepsis and was treated for several days in the hospital with IV antibiotics.  He ultimately clinically improved.  Blood cultures were negative.  At that visit, he was prescribed finasteride and Flomax.  He took a few days of Flomax but otherwise has not been taking the medications.  Today, he reports his urinary symptoms are significantly improved.  IPSS as below.  At last visit, he was 22/5, currently 8/4.  He does have evidence of microscopic hematuria on UA.  He denies any gross hematuria.  His most recent cross-sectional imaging in the form of his CT abdomen pelvis with contrast on 01/11/2017 showed an incompletely characterized 10 mm complex right renal cyst, prostamegaly and small nonobstructing nephrolithiasis.  He has not had any subsequent or follow-up imaging.    No flank pain or gross hematuria.  His baseline creatinine is around 1.7.  He has chronic stage III CKD.  He was initially referred for elevated PSA to 5.7 on 12/2017.  This is yet to be repeated in the setting of UTI.  Rectal exam in 03/2018 showed prostamegaly without nodules.   IPSS    Row Name 06/09/18 1100         International Prostate Symptom Score   How often have you had the sensation of not emptying your bladder?  Not at All     How often have you had to urinate less than every two hours?  Less than 1 in 5 times       How often have you found you stopped and started again several times when you urinated?  Less than 1 in 5 times     How often have you found it difficult to postpone urination?  Not at All     How often have you had a weak urinary stream?  About half the time     How often have you had to strain to start urination?  Not at All     How many times did you typically get up at night to urinate?  3 Times     Total IPSS Score  8       Quality of Life due to urinary symptoms   If you were to spend the rest of your life with your urinary condition just the way it is now how would you feel about that?  Mostly Disatisfied        Score:  1-7 Mild 8-19 Moderate 20-35 Severe     PMH: Past Medical History:  Diagnosis Date  . Chronic atrial fibrillation ([place])    a. Afib dates back to at least 2002 when reviewing prior EKG with EKGs from 2014 onward showing Afib; b. CHADS2VASc at least 4 (CHF, HTN, age x 2)  . Chronic diastolic CHF (congestive heart failure) ([place])    a. TTE 5/19: EF 70-75%, mod LVH, near complete obliteration of the mid  and apical LV cavity (can be seen in apical hypertrophic CM), mildly dilated LA, RV cavity size nl, RV wall thickness nl, RVSF mildly reduced, mildly dilated RA; b. 02/2018 Limited echo w/ definity: EF 50-65%, no apical hypertrophic CM.  Marland Kitchen Gout   . Hypertension     Surgical History: Past Surgical History:  Procedure Laterality Date  . CHOLECYSTECTOMY N/A 01/13/2017   Procedure: LAPAROSCOPIC CHOLECYSTECTOMY;  Surgeon: Jules Husbands, MD;  Location: [facility] ORS;  Service: General;  Laterality: N/A;    Home Medications:  Allergies as of 06/09/2018      Reactions   Cortisone       Medication List        Accurate as of 06/09/18  3:53 PM. Always use your most recent med list.          aspirin 325 MG tablet Take 325 mg by mouth daily.   atorvastatin 40 MG tablet Commonly known as:  LIPITOR Take 1 tablet by mouth daily.   diltiazem 180 MG 24 hr  capsule Commonly known as:  CARDIZEM CD Take 1 capsule (180 mg total) by mouth daily.   feeding supplement (ENSURE ENLIVE) Liqd Take 237 mLs by mouth 2 (two) times daily between meals.   furosemide 40 MG tablet Commonly known as:  LASIX Take 1 tablet (40 mg total) by mouth daily.   metoprolol tartrate 50 MG tablet Commonly known as:  LOPRESSOR Take 1 tablet (50 mg total) by mouth 2 (two) times daily.       Allergies:  Allergies  Allergen Reactions  . Cortisone     Family History: Family History  Problem Relation Age of Onset  . Stroke Mother     Social History:  reports that he has never smoked. He has never used smokeless tobacco. He reports that he does not drink alcohol or use drugs.  ROS: UROLOGY Frequent Urination?: No Hard to postpone urination?: No Burning/pain with urination?: No Get up at night to urinate?: Yes Leakage of urine?: No Urine stream starts and stops?: No Trouble starting stream?: No Do you have to strain to urinate?: No Blood in urine?: No Urinary tract infection?: No Sexually transmitted disease?: No Injury to kidneys or bladder?: No Painful intercourse?: No Weak stream?: No Erection problems?: Yes Penile pain?: No  Gastrointestinal Nausea?: No Vomiting?: No Indigestion/heartburn?: No Diarrhea?: No Constipation?: No  Constitutional Fever: No Night sweats?: No Weight loss?: No Fatigue?: No  Skin Skin rash/lesions?: No Itching?: No  Eyes Blurred vision?: No Double vision?: No  Ears/Nose/Throat Sore throat?: No Sinus problems?: No  Hematologic/Lymphatic Swollen glands?: No Easy bruising?: No  Cardiovascular Leg swelling?: Yes Chest pain?: No  Respiratory Cough?: No Shortness of breath?: No  Endocrine Excessive thirst?: No  Musculoskeletal Back pain?: No Joint pain?: No  Neurological Headaches?: No Dizziness?: No  Psychologic Depression?: No Anxiety?: No  Physical Exam: BP (!) 174/79   Pulse  (!) 120   Ht 6' (1.829 m)   Wt 193 lb (87.5 kg)   BMI 26.18 kg/m   Constitutional:  Alert and oriented, No acute distress.   HEENT: [place] AT, moist mucus membranes.  Trachea midline, no masses. Cardiovascular: No clubbing, cyanosis, or edema. Respiratory: Normal respiratory effort, no increased work of breathing. Skin: No rashes, bruises or suspicious lesions. Neurologic: Grossly intact, no focal deficits, moving all 4 extremities. Psychiatric: Normal mood and affect.  Laboratory Data: Lab Results  Component Value Date   WBC 11.6 (H) 04/24/2018   HGB 12.2 (L) 04/24/2018  HCT 36.3 (L) 04/24/2018   MCV 83.5 04/24/2018   PLT 138 (L) 04/24/2018    Lab Results  Component Value Date   CREATININE 1.62 (H) 04/23/2018     Lab Results  Component Value Date   HGBA1C 6.6 (H) 04/21/2018    Urinalysis Urine today with 3-10 red blood cells per high-power field, otherwise unremarkable.  Pertinent Imaging: CT scan from 12/2016 personally reviewed today  Assessment & Plan:    1. BPH with urinary obstruction Symptoms dramatically improved with treatment of urinary tract infection Symptoms recently well controlled, IPSS 8/4 today He is not taking Flomax or finasteride and does not want to take any medication at this point in time We will continue to follow clinically with serial IPSS/PVR - BLADDER SCAN AMB NON-IMAGING - Urinalysis, Complete  2. Elevated PSA Personal history of elevated PSA which may be appropriate for his gland size Plan to repeat the PSA today to ensure stability - PSA  3. Microscopic hematuria Incidental microscopic hematuria after resolution of his urinary tract infection He does also have a history of a complex renal cyst which warrants further work-up as well He has agreed to pursuing CT urogram as well as cystoscopy for further evaluation - CT HEMATURIA WORKUP; Future   Return in about 4 weeks (around 07/07/2018) for cysto/ f/u CT scan.  Hollice Espy,  MD  [facility] [address], [place], [place] [address] [phone]

## 2018-06-10 ENCOUNTER — Telehealth: Payer: Self-pay | Admitting: Family Medicine

## 2018-06-10 LAB — PSA: Prostate Specific Ag, Serum: 5.8 ng/mL — ABNORMAL HIGH (ref 0.0–4.0)

## 2018-06-10 NOTE — Telephone Encounter (Signed)
Unable to leave message, no machine, no answer

## 2018-06-10 NOTE — Telephone Encounter (Signed)
-----   Message from Hollice Espy, MD sent at 06/10/2018  8:20 AM EDT ----- PSA is stably elevated.  Likely appropriate for his age and size gland.  We will continue to follow this.  Hollice Espy, MD

## 2018-06-15 NOTE — Telephone Encounter (Signed)
Patient returned call and was advised of his PSA results.  He voiced understanding.

## 2018-06-15 NOTE — Telephone Encounter (Signed)
LMOM to return call.

## 2018-06-22 ENCOUNTER — Ambulatory Visit
Admission: RE | Admit: 2018-06-22 | Discharge: 2018-06-22 | Disposition: A | Discharge status: Home / Self Care | Payer: Medicare Other | Source: Ambulatory Visit | Attending: Urology | Admitting: Urology

## 2018-06-22 DIAGNOSIS — M5135 Other intervertebral disc degeneration, thoracolumbar region: Secondary | ICD-10-CM | POA: Insufficient documentation

## 2018-06-22 DIAGNOSIS — K439 Ventral hernia without obstruction or gangrene: Secondary | ICD-10-CM | POA: Insufficient documentation

## 2018-06-22 DIAGNOSIS — R3129 Other microscopic hematuria: Secondary | ICD-10-CM | POA: Diagnosis present

## 2018-06-22 DIAGNOSIS — K5730 Diverticulosis of large intestine without perforation or abscess without bleeding: Secondary | ICD-10-CM | POA: Insufficient documentation

## 2018-06-22 DIAGNOSIS — N289 Disorder of kidney and ureter, unspecified: Secondary | ICD-10-CM | POA: Insufficient documentation

## 2018-06-22 DIAGNOSIS — I2510 Atherosclerotic heart disease of native coronary artery without angina pectoris: Secondary | ICD-10-CM | POA: Diagnosis not present

## 2018-06-22 DIAGNOSIS — Q6102 Congenital multiple renal cysts: Secondary | ICD-10-CM | POA: Insufficient documentation

## 2018-06-22 DIAGNOSIS — N400 Enlarged prostate without lower urinary tract symptoms: Secondary | ICD-10-CM | POA: Insufficient documentation

## 2018-06-22 DIAGNOSIS — N200 Calculus of kidney: Secondary | ICD-10-CM | POA: Insufficient documentation

## 2018-06-22 DIAGNOSIS — M47815 Spondylosis without myelopathy or radiculopathy, thoracolumbar region: Secondary | ICD-10-CM | POA: Insufficient documentation

## 2018-06-22 DIAGNOSIS — N433 Hydrocele, unspecified: Secondary | ICD-10-CM | POA: Diagnosis not present

## 2018-06-22 DIAGNOSIS — I700 Atherosclerosis of aorta: Secondary | ICD-10-CM | POA: Insufficient documentation

## 2018-06-22 DIAGNOSIS — K449 Diaphragmatic hernia without obstruction or gangrene: Secondary | ICD-10-CM | POA: Diagnosis not present

## 2018-06-22 LAB — POCT I-STAT CREATININE: Creatinine, Ser: 1.8 mg/dL — ABNORMAL HIGH (ref 0.61–1.24)

## 2018-06-22 IMAGING — CT CT ABD-PEL WO/W CM
2 of 12 series · 9 of 46 positions shown, 15 images · IV contrast (iopamidol)
Comparison: Multiple exams, including renal ultrasound [DATE]
and CT from [DATE]

CLINICAL DATA: Microscopic hematuria

EXAM:
CT ABDOMEN AND PELVIS WITHOUT AND WITH CONTRAST
TECHNIQUE: Multidetector CT imaging of the abdomen and pelvis was performed
following the standard protocol before and following the bolus
administration of intravenous contrast.
CONTRAST:  100mL [Q9] IOPAMIDOL ([Q9]) INJECTION 61%

[Series 2: without pre · axial · non-contrast · 0.70mm/px · z∈[-1620,-1165]mm · 7 of 123 slices shown, 12 images]
[im 16/123  soft-tissue]
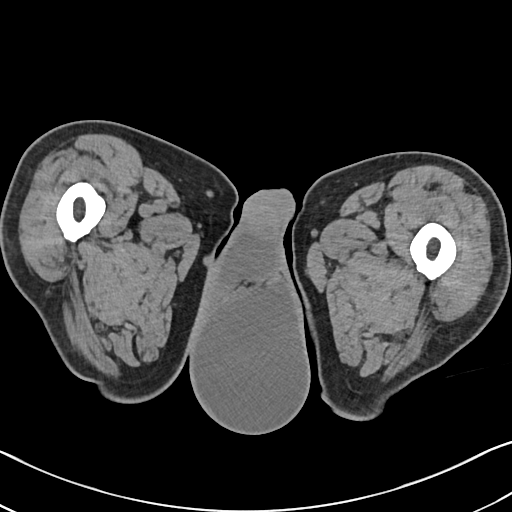
[im 16/123  bone]
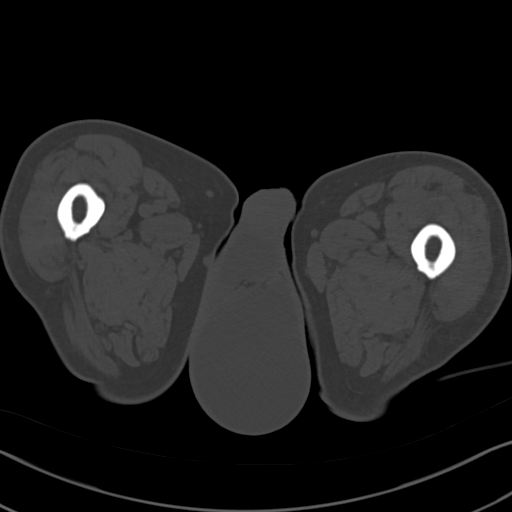
[im 31/123  soft-tissue]
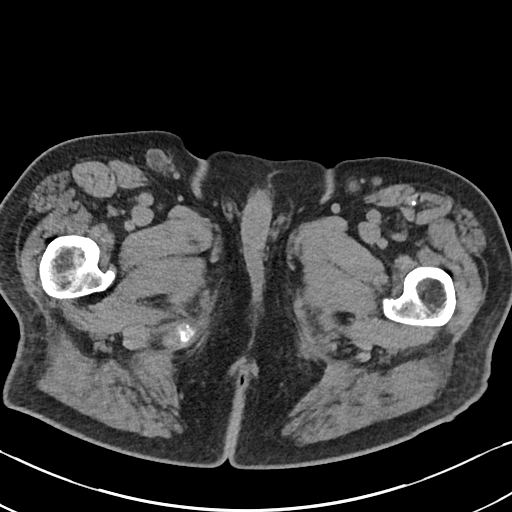
[im 46/123  soft-tissue]
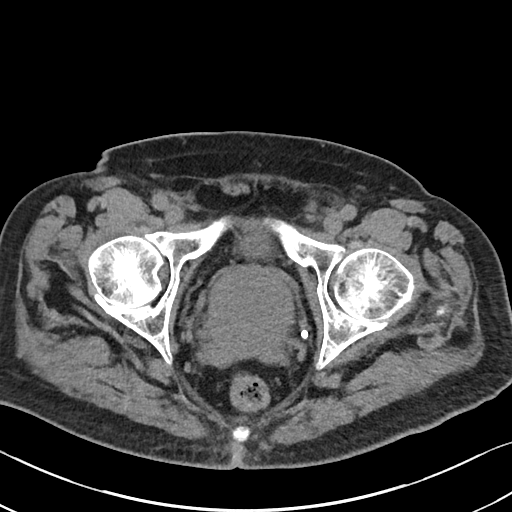
[im 62/123  soft-tissue]
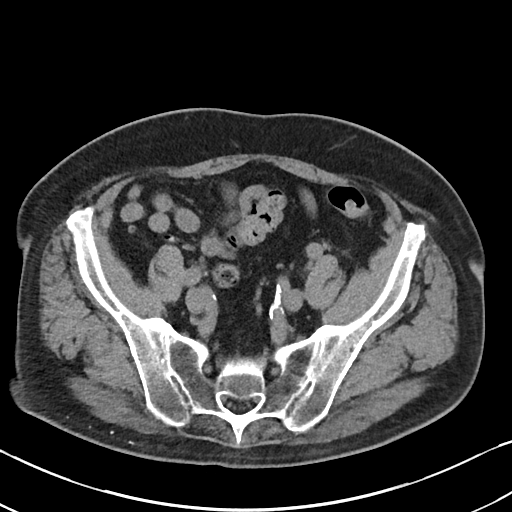
[im 62/123  lung]
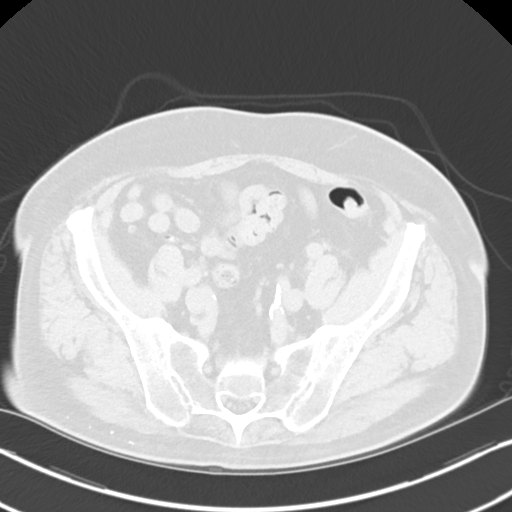
[im 77/123  soft-tissue]
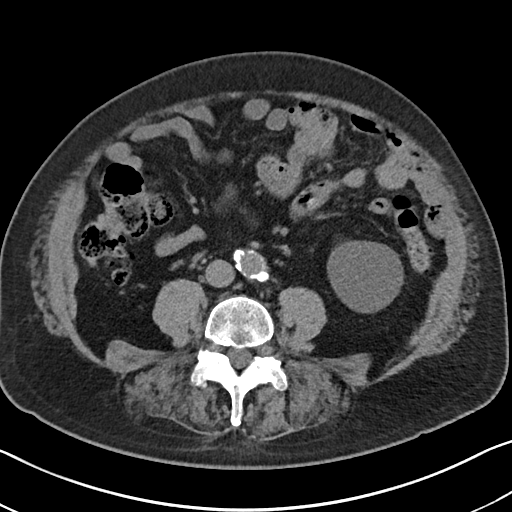
[im 77/123  lung]
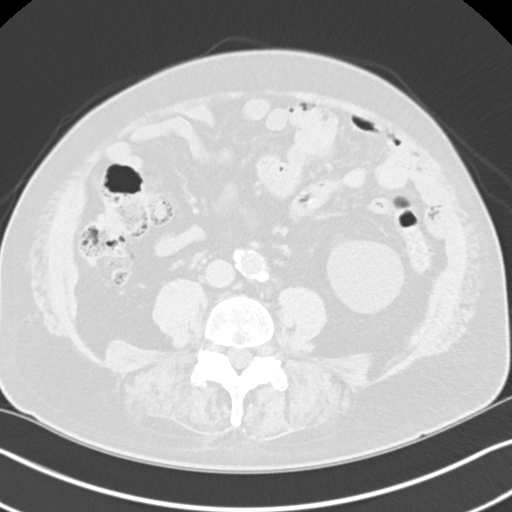
[im 92/123  soft-tissue]
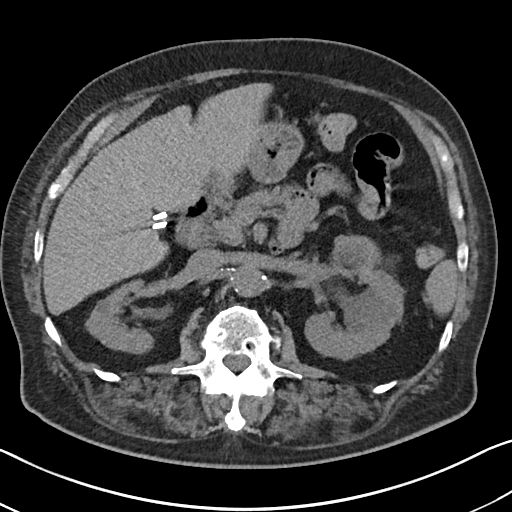
[im 92/123  lung]
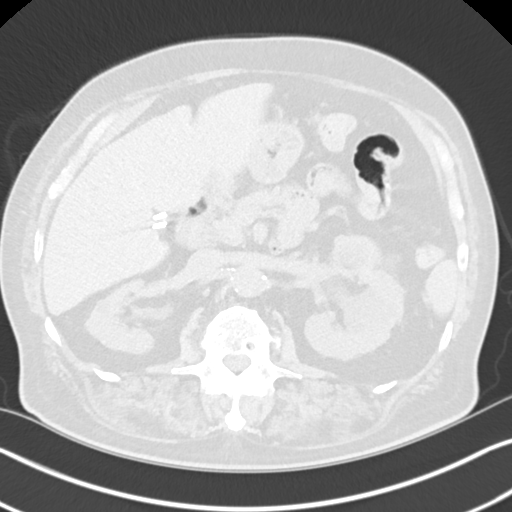
[im 107/123  soft-tissue]
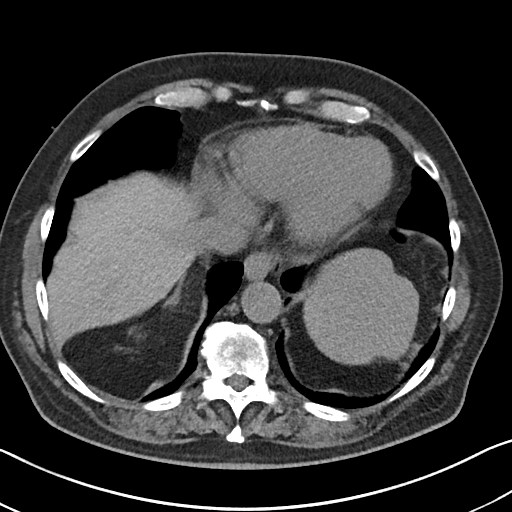
[im 107/123  lung]
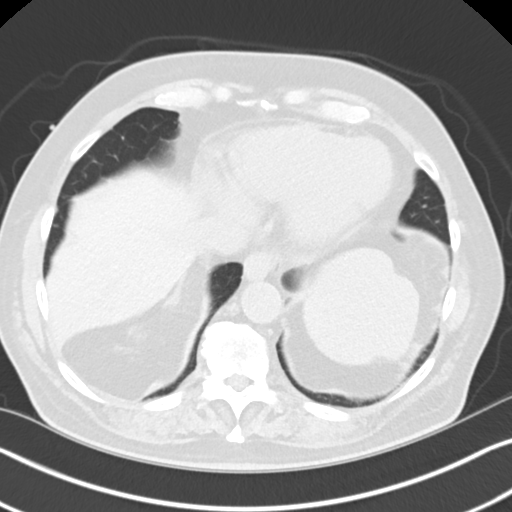

[Series 5: cor without without pre · coronal · non-contrast · 0.70mm/px · 2 of 177 slices shown, 3 images]
[im 59/177  soft-tissue]
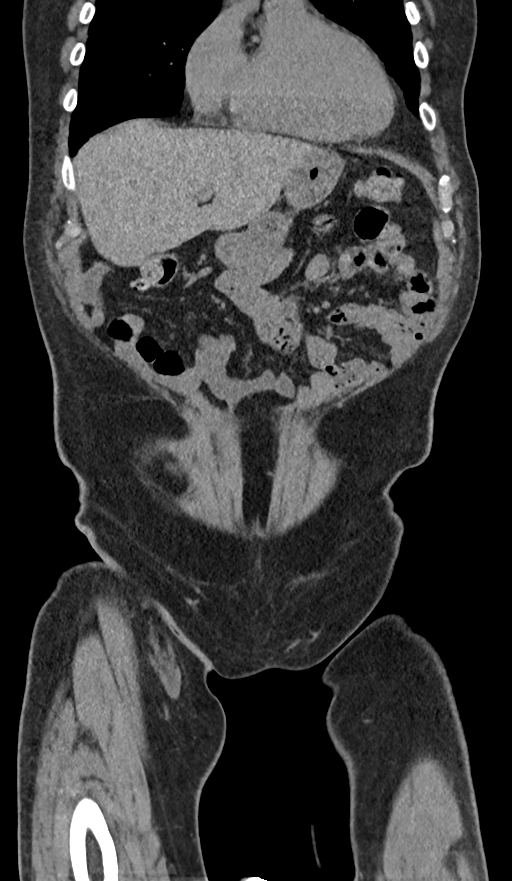
[im 59/177  bone]
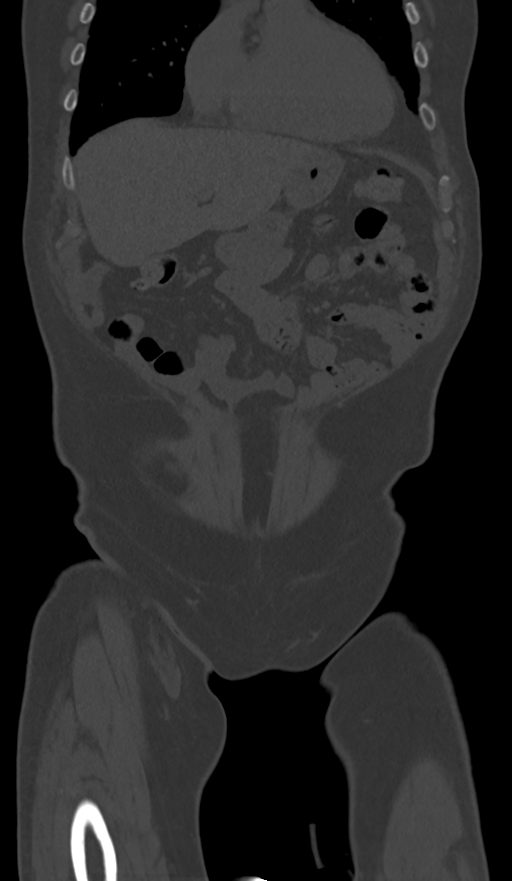
[im 118/177  soft-tissue]
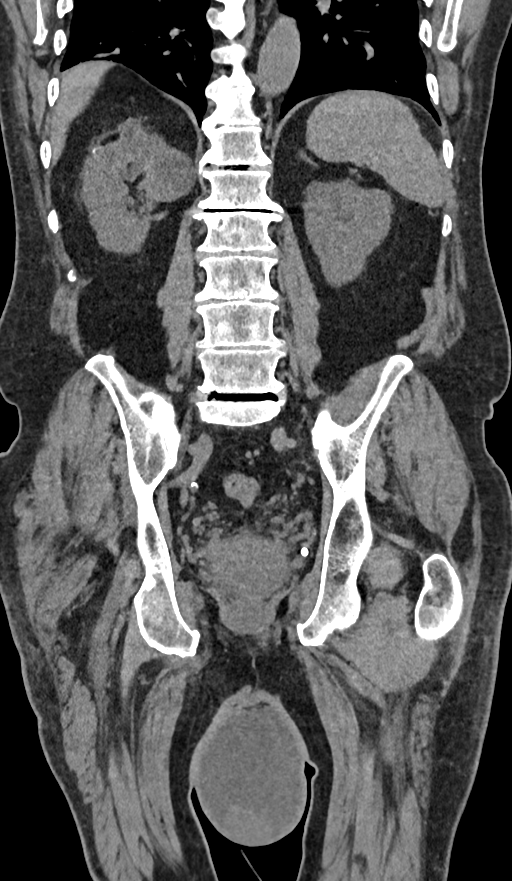

[9 of 46 positions shown; findings below may reference images not displayed]

FINDINGS: Lower chest: Calcified granuloma in the right middle lobe on image
[DATE].

Three-vessel coronary artery atherosclerotic calcification. Mild
cardiomegaly. Small type 1 hiatal hernia.

Hepatobiliary: Cholecystectomy.  Otherwise unremarkable.

Pancreas: Unremarkable

Spleen: Unremarkable

Adrenals/Urinary Tract: Both adrenal glands appear normal.

There are approximately 20 hypodense lesions scattered in both
kidneys which are for the most part are compatible with cysts,
although the smaller lesions are technically nonspecific. In the
right kidney upper pole, a 1.1 by 1.6 cm lesion is stable from
[DATE] and has peripheral calcification possibly some peripheral
complexity/density (image [DATE]). Several left renal lesions have
accentuated precontrast T1 signal characteristics but do not
measurably hands, compatible with Bosniak category 2 cysts. There is
scattered scarring in both kidneys causing a lobulated and somewhat
irregular appearance of the contours.

There about 5 calculi in the left mid kidney and left kidney lower
pole, the largest is a cluster in the left kidney lower pole
measuring 1.6 by 0.7 cm on image 92/12. There is splaying of the
left renal collecting system by a parapelvic cysts. Calcification
and left mid kidney calyceal diverticulum is present.

There is a large impression on the base of the urinary bladder due
to the enlarged prostate gland. Aside from the stones I see no
definite additional filling defect along the urothelium.

Stomach/Bowel: Sigmoid colon diverticulosis.

Vascular/Lymphatic: Aortoiliac atherosclerotic vascular disease.
Upper normal sized right external iliac node at 0.9 cm in short axis
on 69/9. A 1.4 cm right inguinal lymph node has a fatty hilum on
image 96/9. Similar upper normal size left external iliac and
inguinal lymph nodes.

Reproductive: The prostate gland measures 6.3 by 6.2 by 7.4 cm
(volume = 150 cm^3). Large left and small right scrotal
hydroceles.

Other: No supplemental non-categorized findings.

Musculoskeletal: Bridging spurring of the sacroiliac joints. Lumbar
spondylosis and degenerative disc disease with grade 1
anterolisthesis at L4-5, multilevel resulting impingement in the
lumbar spine.

Small supraumbilical hernia in the midline contains adipose tissue,
image 89/7.
IMPRESSION: 1. Multiple nonobstructive left-sided renal calculi, largest cluster
measuring 1.6 cm in long axis.
2. About 20 hypodense lesions scattered in both kidneys, some with
high precontrast density in some of marginal calcifications
indicating complexity. None of these are definitively enhancing
although some are technically too small to characterize and may
warrant surveillance.
3. A notable left parapelvic cyst splays the collecting system on
the left.
4. Prominent prostatomegaly, prostate volume estimated at 150 cubic
cm.
5. Large left and small right scrotal hydroceles.
6. Other imaging findings of potential clinical significance: Aortic
Atherosclerosis ([Q9]-[Q9]). Coronary artery atherosclerosis with
mild cardiomegaly. Small type 1 hiatal hernia. Sigmoid colon
diverticulosis. Multilevel thoracolumbar injury due to spondylosis
and degenerative disc disease. Small supraumbilical ventral hernia
contains tissue.

## 2018-06-22 MED ORDER — IOPAMIDOL (ISOVUE-300) INJECTION 61%
100.0000 mL | Freq: Once | INTRAVENOUS | Status: AC | PRN
  Administered 2018-06-22: 100 mL via INTRAVENOUS

## 2018-07-20 NOTE — Progress Notes (Signed)
Patient ID: George Bolton, male    DOB: April 20, 1941, 77 y.o.   MRN: [id number]  HPI  George Bolton is a 77 y/o male with a history of HTN, atrial fibrillation, gout and chronic heart failure.   Echo report from 03/03/18 reviewed and showed an EF of 60-65%.   Admitted 04/21/18 due to sepsis due to UTI. Treated with antibiotics and was discharged after 3 days. Was in the ED 04/07/18 due to acute cystitis where he was treated and released. Admitted 02/28/18 due to acute on chronic HF. Cardiology consult obtained. Initially needed IV lasix and then transitioned to oral diuretics. Due to decrease in LV capacity, watch for overdiuresis. Discharged after 4 days.   He presents today for a follow-up visit with a chief complaint of minimal fatigue upon moderate exertion. He describes this as chronic in nature having been present for several years. He has associated pedal edema, joint pain and difficulty sleeping along with this. He denies any weight gain, dizziness, abdominal distention, palpitations, chest pain, cough or shortness of breath.   Past Medical History:  Diagnosis Date  . Chronic atrial fibrillation ([place])    a. Afib dates back to at least 2002 when reviewing prior EKG with EKGs from 2014 onward showing Afib; b. CHADS2VASc at least 4 (CHF, HTN, age x 2)  . Chronic diastolic CHF (congestive heart failure) ([place])    a. TTE 5/19: EF 70-75%, mod LVH, near complete obliteration of the mid and apical LV cavity (can be seen in apical hypertrophic CM), mildly dilated LA, RV cavity size nl, RV wall thickness nl, RVSF mildly reduced, mildly dilated RA; b. 02/2018 Limited echo w/ definity: EF 50-65%, no apical hypertrophic CM.  Marland Kitchen Gout   . Hypertension    Past Surgical History:  Procedure Laterality Date  . CHOLECYSTECTOMY N/A 01/13/2017   Procedure: LAPAROSCOPIC CHOLECYSTECTOMY;  Surgeon: Jules Husbands, MD;  Location: [facility] ORS;  Service: General;  Laterality: N/A;   Family History  Problem Relation Age of  Onset  . Stroke Mother    Social History   Tobacco Use  . Smoking status: Never Smoker  . Smokeless tobacco: Never Used  Substance Use Topics  . Alcohol use: No   Allergies  Allergen Reactions  . Cortisone    Past Medical History:  Diagnosis Date  . Chronic atrial fibrillation ([place])    a. Afib dates back to at least 2002 when reviewing prior EKG with EKGs from 2014 onward showing Afib; b. CHADS2VASc at least 4 (CHF, HTN, age x 2)  . Chronic diastolic CHF (congestive heart failure) ([place])    a. TTE 5/19: EF 70-75%, mod LVH, near complete obliteration of the mid and apical LV cavity (can be seen in apical hypertrophic CM), mildly dilated LA, RV cavity size nl, RV wall thickness nl, RVSF mildly reduced, mildly dilated RA; b. 02/2018 Limited echo w/ definity: EF 50-65%, no apical hypertrophic CM.  Marland Kitchen Gout   . Hypertension    Past Surgical History:  Procedure Laterality Date  . CHOLECYSTECTOMY N/A 01/13/2017   Procedure: LAPAROSCOPIC CHOLECYSTECTOMY;  Surgeon: Jules Husbands, MD;  Location: [facility] ORS;  Service: General;  Laterality: N/A;   Family History  Problem Relation Age of Onset  . Stroke Mother    Social History   Tobacco Use  . Smoking status: Never Smoker  . Smokeless tobacco: Never Used  Substance Use Topics  . Alcohol use: No   Allergies  Allergen Reactions  . Cortisone  Prior to Admission medications   Medication Sig Start Date End Date Taking? Authorizing Provider  aspirin 325 MG tablet Take 325 mg by mouth daily.   Yes [provider]  atorvastatin (LIPITOR) 40 MG tablet Take 1 tablet by mouth daily.   Yes [provider]  diltiazem (CARDIZEM CD) 180 MG 24 hr capsule Take 1 capsule (180 mg total) by mouth daily. 03/04/18  Yes Gladstone Lighter, MD  felodipine (PLENDIL) 10 MG 24 hr tablet Take 10 mg by mouth daily.   Yes [provider]  furosemide (LASIX) 40 MG tablet Take 1 tablet (40 mg total) by mouth daily. 04/24/18  Yes Hillary Bow, MD  metoprolol tartrate (LOPRESSOR) 50 MG tablet Take 1 tablet (50 mg total) by mouth 2 (two) times daily. 03/04/18  Yes Gladstone Lighter, MD    Review of Systems  Constitution: Positive for malaise/fatigue. Negative for decreased appetite.  HENT: Negative for congestion, hoarse voice and sore throat.   Eyes: Negative.   Cardiovascular: Positive for leg swelling. Negative for chest pain, dyspnea on exertion and palpitations.  Respiratory: Negative for cough, shortness of breath and wheezing.   Endocrine: Negative.   Hematologic/Lymphatic: Negative.   Skin: Negative.   Musculoskeletal: Positive for joint pain (left hand) and neck pain.  Gastrointestinal: Negative for bloating and abdominal pain.  Genitourinary: Negative.   Neurological: Negative for dizziness, light-headedness and weakness.  Psychiatric/Behavioral: Negative for depression. The patient has insomnia (waking up every 3-4 hours). The patient is not nervous/anxious.   Allergic/Immunologic: Negative.    Vitals:   07/21/18 1037  BP: (!) 135/100  Pulse: (!) 137  Resp: 18  SpO2: 98%  Weight: 201 lb 2 oz (91.2 kg)  Height: 6' (1.829 m)   Wt Readings from Last 3 Encounters:  07/21/18 201 lb 2 oz (91.2 kg)  07/21/18 198 lb (89.8 kg)  06/09/18 193 lb (87.5 kg)   Lab Results  Component Value Date   CREATININE 1.80 (H) 06/22/2018   CREATININE 1.62 (H) 04/23/2018   CREATININE 1.71 (H) 04/21/2018    Physical Exam  Constitutional: He is oriented to person, place, and time. He appears well-developed and well-nourished.  HENT:  Head: Normocephalic and atraumatic.  Neck: Normal range of motion. Neck supple. No JVD present.  Cardiovascular: An irregular rhythm present. Tachycardia present.  Pulmonary/Chest: Effort normal. No respiratory distress. He has no wheezes. He has no rales.  Abdominal: Soft. He exhibits no distension.  Musculoskeletal: He exhibits edema (2+ pitting edema with L>R). He exhibits no  tenderness.       Right lower leg: He exhibits edema.       Left lower leg: He exhibits edema.  Neurological: He is alert and oriented to person, place, and time.  Skin: Skin is warm and dry.  Psychiatric: He has a normal mood and affect. His behavior is normal.  Nursing note and vitals reviewed.  Assessment & Plan:  1: Chronic heart failure with preserved ejection fraction- - NYHA class II - mildly fluid overloaded with pedal edema - weighing daily and says that his weight has been stable. Reminded to call for an overnight weight gain of >2 pounds or a weekly weight gain of >5 pounds - weight up 1.2 pounds since he was last here 3 months ago - not adding salt except on watermelon. Reminded to keep daily sodium intake to 2000mg  a day  - BNP 01/11/17 was 530.0 - he has not taken the flu vaccine yet  2: HTN- -  BP elevated but he says that he hasn't taken any of his medications yet today but will do so once he returns home - follows with PCP Posey Pronto at [facility]) regarding this - BMP 04/23/18 reviewed and showed sodium 137, potassium 4.0, creatinine 1.62 and GFR 45  3: Lymphedema- - stage 2 - has been elevating legs when sitting for long periods of time - has been wearing compression socks intermittently but edema continues - encouraged him to increase his activity - will make referral for lymphapress compression pumps  Patient did not bring his medications nor a list. Each medication was verbally reviewed with the patient and he was encouraged to bring the bottles to every visit to confirm accuracy of list.  Return in 4 months or sooner for any questions/problems before then.

## 2018-07-21 ENCOUNTER — Encounter: Payer: Self-pay | Admitting: Urology

## 2018-07-21 ENCOUNTER — Ambulatory Visit (INDEPENDENT_AMBULATORY_CARE_PROVIDER_SITE_OTHER): Payer: Medicare Other | Admitting: Urology

## 2018-07-21 ENCOUNTER — Ambulatory Visit: Payer: Medicare Other | Attending: Family | Admitting: Family

## 2018-07-21 ENCOUNTER — Encounter: Payer: Self-pay | Admitting: Family

## 2018-07-21 VITALS — BP 135/100 | HR 137 | Resp 18 | Ht 72.0 in | Wt 201.1 lb

## 2018-07-21 VITALS — BP 147/105 | HR 140 | Ht 72.0 in | Wt 198.0 lb

## 2018-07-21 DIAGNOSIS — I482 Chronic atrial fibrillation: Secondary | ICD-10-CM | POA: Insufficient documentation

## 2018-07-21 DIAGNOSIS — I110 Hypertensive heart disease with heart failure: Secondary | ICD-10-CM | POA: Diagnosis present

## 2018-07-21 DIAGNOSIS — Z79899 Other long term (current) drug therapy: Secondary | ICD-10-CM | POA: Diagnosis not present

## 2018-07-21 DIAGNOSIS — M109 Gout, unspecified: Secondary | ICD-10-CM | POA: Diagnosis not present

## 2018-07-21 DIAGNOSIS — I890 Lymphedema, not elsewhere classified: Secondary | ICD-10-CM | POA: Diagnosis not present

## 2018-07-21 DIAGNOSIS — R972 Elevated prostate specific antigen [PSA]: Secondary | ICD-10-CM

## 2018-07-21 DIAGNOSIS — Z7982 Long term (current) use of aspirin: Secondary | ICD-10-CM | POA: Insufficient documentation

## 2018-07-21 DIAGNOSIS — N281 Cyst of kidney, acquired: Secondary | ICD-10-CM

## 2018-07-21 DIAGNOSIS — I10 Essential (primary) hypertension: Secondary | ICD-10-CM

## 2018-07-21 DIAGNOSIS — R3129 Other microscopic hematuria: Secondary | ICD-10-CM

## 2018-07-21 DIAGNOSIS — I5032 Chronic diastolic (congestive) heart failure: Secondary | ICD-10-CM | POA: Diagnosis not present

## 2018-07-21 DIAGNOSIS — N401 Enlarged prostate with lower urinary tract symptoms: Secondary | ICD-10-CM

## 2018-07-21 DIAGNOSIS — N138 Other obstructive and reflux uropathy: Secondary | ICD-10-CM

## 2018-07-21 LAB — URINALYSIS, COMPLETE
Bilirubin, UA: NEGATIVE
Glucose, UA: NEGATIVE
Ketones, UA: NEGATIVE
Leukocytes, UA: NEGATIVE
Nitrite, UA: NEGATIVE
Specific Gravity, UA: 1.025 (ref 1.005–1.030)
Urobilinogen, Ur: 0.2 mg/dL (ref 0.2–1.0)
pH, UA: 6 (ref 5.0–7.5)

## 2018-07-21 LAB — MICROSCOPIC EXAMINATION: Epithelial Cells (non renal): NONE SEEN /hpf (ref 0–10)

## 2018-07-21 NOTE — Patient Instructions (Signed)
Continue weighing daily and call for an overnight weight gain of > 2 pounds or a weekly weight gain of >5 pounds. 

## 2018-07-21 NOTE — Progress Notes (Signed)
   08/15/18  CC:  Chief Complaint  Patient presents with  . Cysto    HPI: 77 year old male with microscopic hematuria, BPH, and UTIs who presents today for office cystoscopy.  Please see office note from 06/09/2018 for details.   In the interim, he is undergone CT urogram on 06/23/2018 which shows multiple findings including multiple nonobstructing stones measuring up to 1.6 cm on the left side.  He has multiple renal cysts with some marginal calcifications but otherwise no enhancement.  He is significant prostamegaly with 150 cc gland.  He has incidental bilateral hydroceles.  Blood pressure (!) 147/105, pulse (!) 140, height 6' (1.829 m), weight 198 lb (89.8 kg). NED. A&Ox3.   No respiratory distress   Abd soft, NT, ND Normal phallus with bilateral descended testicles  Cystoscopy Procedure Note  Patient identification was confirmed, informed consent was obtained, and patient was prepped using Betadine solution.  Lidocaine jelly was administered per urethral meatus.     Pre-Procedure: - Inspection reveals a normal caliber ureteral meatus.  Procedure: The flexible cystoscope was introduced without difficulty - No urethral strictures/lesions are present. - Enlarged prostate with significant trilobar coaptation - Elevated bladder neck - Bilateral ureteral orifices identified - Bladder mucosa  reveals no ulcers, tumors, or lesions - No bladder stones - Moderate rabeculation  Retroflexion shows intravesical protrusion of the prostatate   Post-Procedure: - Patient tolerated the procedure well  Assessment/ Plan:  1. Microscopic hematuria Status post CT urogram with findings listed as above Cystoscopy today shows significant prostamegaly with friable prostatic mucosa, likely the source of his underlying microscopic blood - Urinalysis, Complete  2. Elevated PSA History of elevated PSA Most recent PSA 5.8 which is likely appropriate given the size of his gland and 77 We  will follow-up, repeat PSA in 1 year Rectal exam reassuring  3. BPH with urinary obstruction Severe prostamegaly, 150 g prostate Previously prescribed finasteride and Flomax, not interested in taking these medications none Not interested in surgery We will follow him clinically  4. Renal cyst Incidental bilateral renal cyst, varying complexity including [place] 2 Given his age and comorbidities, recommend repeat CT abdomen with and without contrast in 1 year Patient is agreeable this plan   Return in about 1 year (around 07/22/2019) for CT scan.  Hollice Espy, MD

## 2018-07-22 ENCOUNTER — Encounter: Payer: Self-pay | Admitting: Family

## 2018-08-11 ENCOUNTER — Other Ambulatory Visit: Payer: Self-pay

## 2018-08-11 MED ORDER — DILTIAZEM HCL ER COATED BEADS 180 MG PO CP24: 180.0000 mg | ORAL_CAPSULE | Freq: Every day | ORAL | 5 refills | Status: DC

## 2018-08-12 ENCOUNTER — Other Ambulatory Visit: Payer: Self-pay

## 2018-08-12 MED ORDER — METOPROLOL TARTRATE 50 MG PO TABS: 50.0000 mg | ORAL_TABLET | Freq: Two times a day (BID) | ORAL | 2 refills | Status: DC

## 2018-08-15 ENCOUNTER — Other Ambulatory Visit: Payer: Self-pay

## 2018-08-15 ENCOUNTER — Encounter: Payer: Self-pay | Admitting: Emergency Medicine

## 2018-08-15 ENCOUNTER — Emergency Department
Admission: EM | Admit: 2018-08-15 | Discharge: 2018-08-15 | Disposition: A | Discharge status: Home / Self Care | Payer: Medicare Other | Attending: Emergency Medicine | Admitting: Emergency Medicine

## 2018-08-15 ENCOUNTER — Emergency Department: Payer: Medicare Other

## 2018-08-15 DIAGNOSIS — R000 Tachycardia, unspecified: Secondary | ICD-10-CM | POA: Diagnosis not present

## 2018-08-15 DIAGNOSIS — M109 Gout, unspecified: Secondary | ICD-10-CM | POA: Diagnosis not present

## 2018-08-15 DIAGNOSIS — I4821 Permanent atrial fibrillation: Secondary | ICD-10-CM

## 2018-08-15 DIAGNOSIS — I110 Hypertensive heart disease with heart failure: Secondary | ICD-10-CM | POA: Insufficient documentation

## 2018-08-15 DIAGNOSIS — Z7982 Long term (current) use of aspirin: Secondary | ICD-10-CM | POA: Insufficient documentation

## 2018-08-15 DIAGNOSIS — M79642 Pain in left hand: Secondary | ICD-10-CM | POA: Diagnosis present

## 2018-08-15 DIAGNOSIS — I5032 Chronic diastolic (congestive) heart failure: Secondary | ICD-10-CM | POA: Diagnosis not present

## 2018-08-15 DIAGNOSIS — Z79899 Other long term (current) drug therapy: Secondary | ICD-10-CM | POA: Insufficient documentation

## 2018-08-15 LAB — COMPREHENSIVE METABOLIC PANEL
ALT: 10 U/L (ref 0–44)
AST: 22 U/L (ref 15–41)
Albumin: 4 g/dL (ref 3.5–5.0)
Alkaline Phosphatase: 73 U/L (ref 38–126)
Anion gap: 9 (ref 5–15)
BUN: 22 mg/dL (ref 8–23)
CO2: 26 mmol/L (ref 22–32)
Calcium: 9.6 mg/dL (ref 8.9–10.3)
Chloride: 109 mmol/L (ref 98–111)
Creatinine, Ser: 1.84 mg/dL — ABNORMAL HIGH (ref 0.61–1.24)
GFR calc Af Amer: 38 mL/min — ABNORMAL LOW (ref >60)
GFR calc non Af Amer: 33 mL/min — ABNORMAL LOW (ref >60)
Glucose, Bld: 125 mg/dL — ABNORMAL HIGH (ref 70–99)
Potassium: 4.1 mmol/L (ref 3.5–5.1)
Sodium: 144 mmol/L (ref 135–145)
Total Bilirubin: 1 mg/dL (ref 0.3–1.2)
Total Protein: 7.9 g/dL (ref 6.5–8.1)

## 2018-08-15 LAB — CBC
HCT: 49.8 % (ref 39.0–52.0)
Hemoglobin: 16.4 g/dL (ref 13.0–17.0)
MCH: 28 pg (ref 26.0–34.0)
MCHC: 32.9 g/dL (ref 30.0–36.0)
MCV: 85.1 fL (ref 80.0–100.0)
Platelets: 183 10*3/uL (ref 150–400)
RBC: 5.85 MIL/uL — ABNORMAL HIGH (ref 4.22–5.81)
RDW: 14.8 % (ref 11.5–15.5)
WBC: 7.8 10*3/uL (ref 4.0–10.5)
nRBC: 0 % (ref 0.0–0.2)

## 2018-08-15 LAB — TROPONIN I: Troponin I: 0.03 ng/mL (ref <0.03)

## 2018-08-15 IMAGING — CR DG CHEST 2V
1 series · 2 of 2 positions shown · non-contrast
Comparison: [DATE] chest radiograph.

CLINICAL DATA: 77 y/o  M; tachycardia.

EXAM:
CHEST - 2 VIEW

[Series 1: dg chest 2 view · 0.14mm/px · 2 of 2 slices shown]
[im 1/2]
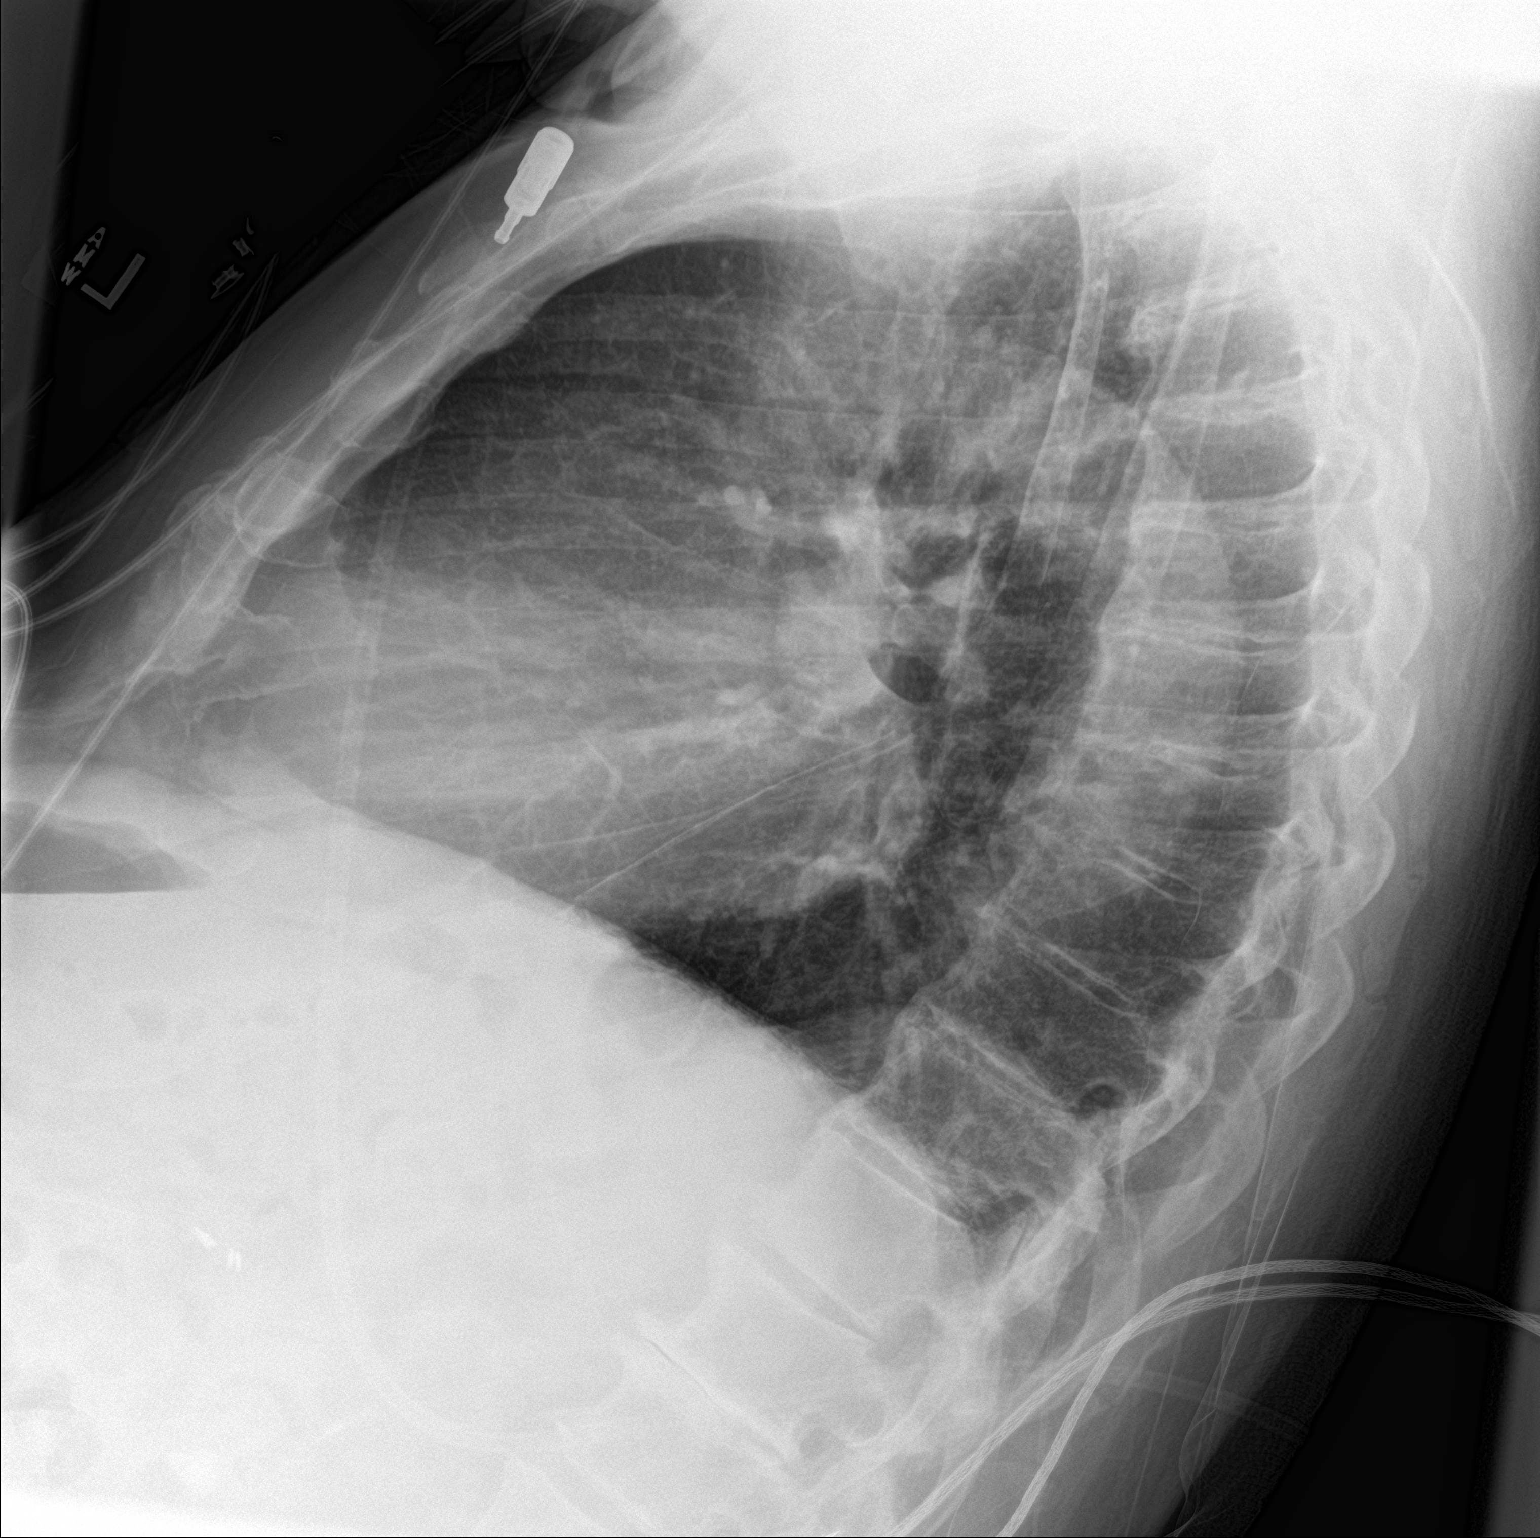
[im 2/2]
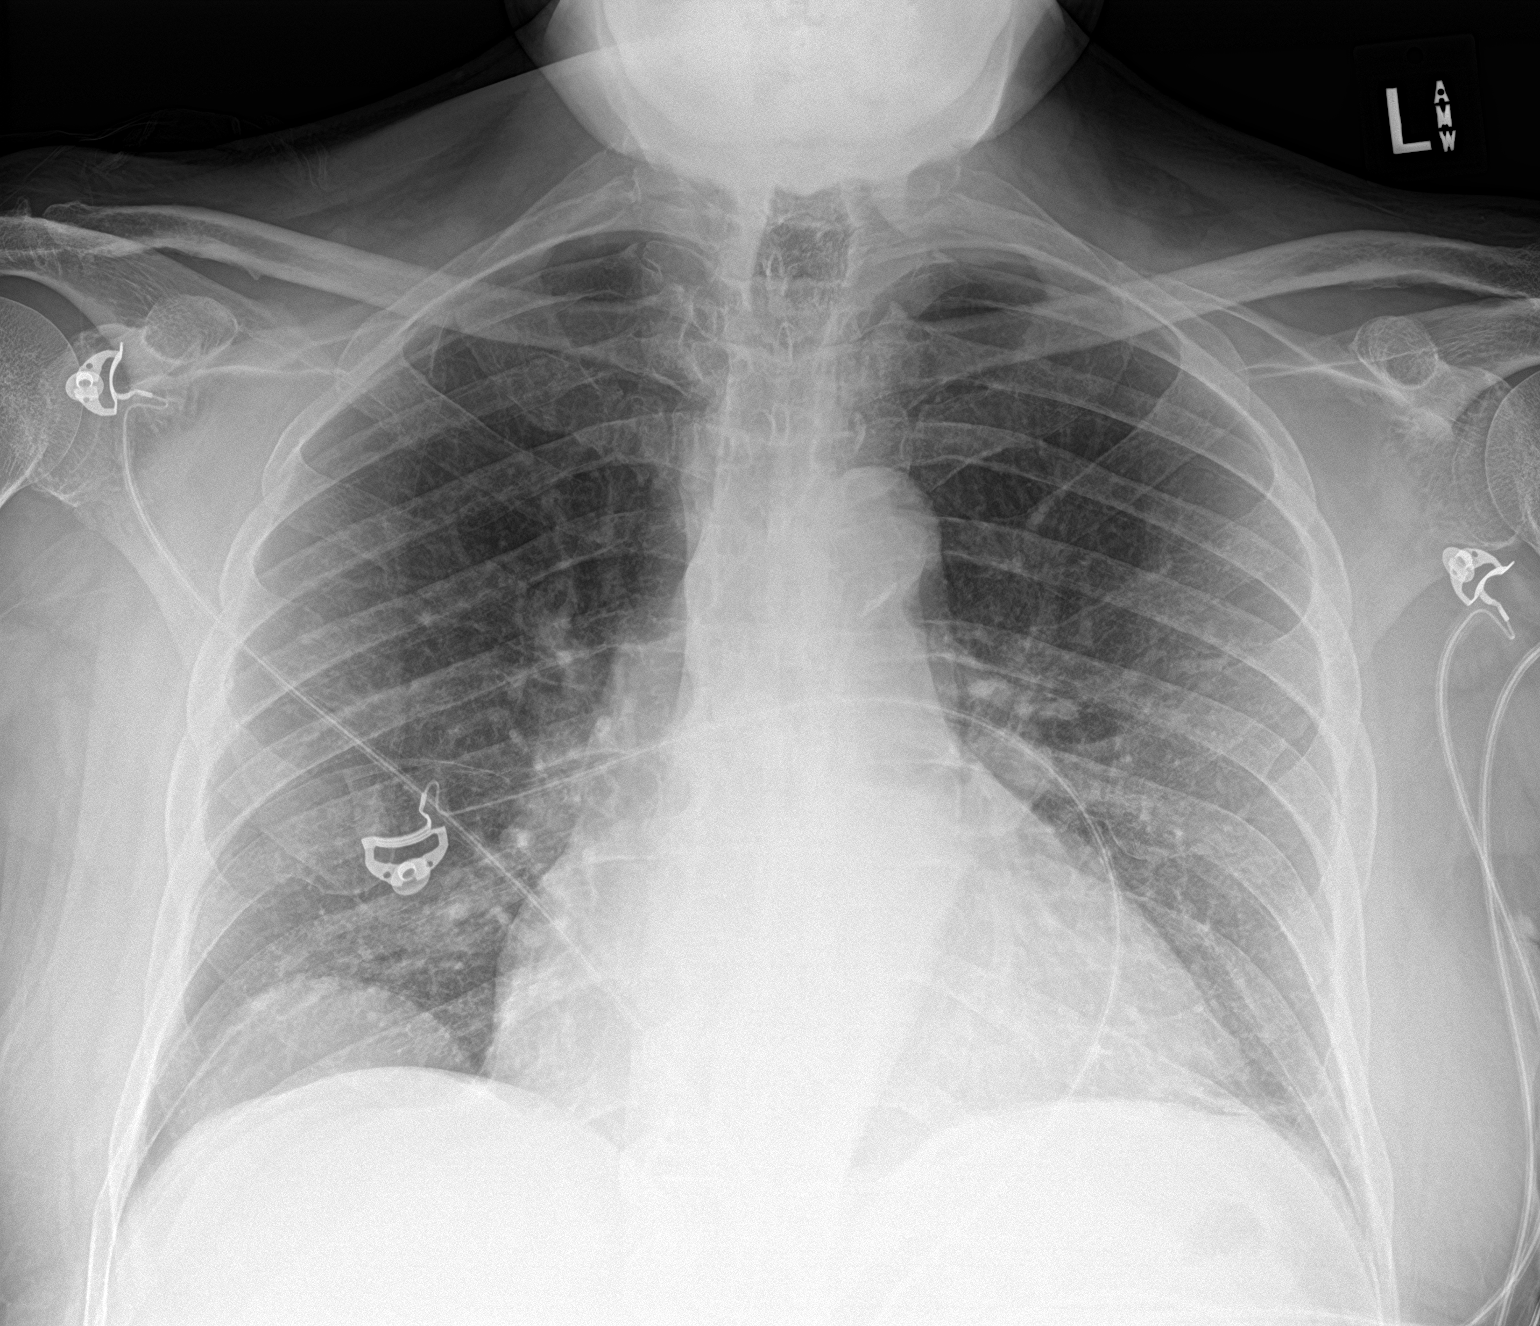

[2 of 2 positions shown; findings below may reference images not displayed]

FINDINGS: Stable cardiomegaly given projection and technique. Calcific aortic
atherosclerosis. No consolidation, effusion, or pneumothorax. No
acute osseous abnormality is evident.
IMPRESSION: Stable cardiomegaly and aortic atherosclerosis. No acute pulmonary
process identified.

By: HOYARANDA M.D.

## 2018-08-15 MED ORDER — COLCHICINE 0.6 MG PO TABS
0.3000 mg | ORAL_TABLET | Freq: Once | ORAL | Status: AC
  Administered 2018-08-15: 0.3 mg via ORAL
  Filled 2018-08-15: qty 0.5

## 2018-08-15 MED ORDER — METOPROLOL TARTRATE 50 MG PO TABS
50.0000 mg | ORAL_TABLET | Freq: Once | ORAL | Status: AC
  Administered 2018-08-15: 50 mg via ORAL
  Filled 2018-08-15: qty 1

## 2018-08-15 MED ORDER — DILTIAZEM HCL 60 MG PO TABS
60.0000 mg | ORAL_TABLET | Freq: Once | ORAL | Status: AC
  Administered 2018-08-15: 60 mg via ORAL
  Filled 2018-08-15: qty 1

## 2018-08-15 MED ORDER — PREDNISONE 20 MG PO TABS: 40.0000 mg | ORAL_TABLET | Freq: Every day | ORAL | 0 refills | Status: DC

## 2018-08-15 NOTE — ED Notes (Signed)
Dr Joni Fears notified of HR increase as well as that it returned to normal.

## 2018-08-15 NOTE — ED Triage Notes (Signed)
States was at doctors office for L hand pain. After walking in from the car felt tachycardic. Per report patient heartrate was 150s. Patient denies CP or SOB.

## 2018-08-15 NOTE — ED Provider Notes (Signed)
[facility] Emergency Department Provider Note  ____________________________________________  Time seen: Approximately 3:44 PM  I have reviewed the triage vital signs and the nursing notes.   HISTORY  Chief Complaint Tachycardia    HPI George Bolton is a 77 y.o. male with a history of atrial fibrillation and CHF and hypertension and gout who saw his doctor today for left hand pain related to a gout flare.  On his way out of the clinic he had an episode of rapid heart rate.  No dizziness chest pain shortness of breath vomiting or diaphoresis.  No syncope.  He came to the ED for evaluation, and on arrival his heart rate has returned to normal.  He does note that he has chronic atrial fibrillation and is supposed to be on metoprolol and diltiazem but has not taken either of these medicines in about 2 months because he has not picked up his refills.  His doctor did call in refills for him 4 days ago and they are waiting for him at the pharmacy.  No trauma fevers chills or other acute complaints.  Currently feels normal      Past Medical History:  Diagnosis Date  . Chronic atrial fibrillation    a. Afib dates back to at least 2002 when reviewing prior EKG with EKGs from 2014 onward showing Afib; b. CHADS2VASc at least 4 (CHF, HTN, age x 2)  . Chronic diastolic CHF (congestive heart failure) ([place])    a. TTE 5/19: EF 70-75%, mod LVH, near complete obliteration of the mid and apical LV cavity (can be seen in apical hypertrophic CM), mildly dilated LA, RV cavity size nl, RV wall thickness nl, RVSF mildly reduced, mildly dilated RA; b. 02/2018 Limited echo w/ definity: EF 50-65%, no apical hypertrophic CM.  Marland Kitchen Gout   . Hypertension      Patient Active Problem List   Diagnosis Date Noted  . Sepsis ([place]) 04/21/2018  . Atrial fibrillation ([place]) 03/15/2018  . Lymphedema 03/15/2018  . Gout 01/25/2017  . Cholecystitis   . CHF (congestive heart failure) ([place]) 04/25/2015   . Hypertension 04/25/2015     Past Surgical History:  Procedure Laterality Date  . CHOLECYSTECTOMY N/A 01/13/2017   Procedure: LAPAROSCOPIC CHOLECYSTECTOMY;  Surgeon: Jules Husbands, MD;  Location: [facility] ORS;  Service: General;  Laterality: N/A;     Prior to Admission medications   Medication Sig Start Date End Date Taking? Authorizing Provider  aspirin 325 MG tablet Take 325 mg by mouth daily.    [provider]  atorvastatin (LIPITOR) 40 MG tablet Take 1 tablet by mouth daily.    [provider]  diltiazem (CARDIZEM CD) 180 MG 24 hr capsule Take 1 capsule (180 mg total) by mouth daily. 08/11/18   Alisa Graff, FNP  felodipine (PLENDIL) 10 MG 24 hr tablet Take 10 mg by mouth daily.    [provider]  furosemide (LASIX) 40 MG tablet Take 1 tablet (40 mg total) by mouth daily. 04/24/18   Hillary Bow, MD  metoprolol tartrate (LOPRESSOR) 50 MG tablet Take 1 tablet (50 mg total) by mouth 2 (two) times daily. 08/12/18   Alisa Graff, FNP  predniSONE (DELTASONE) 20 MG tablet Take 2 tablets (40 mg total) by mouth daily. 08/15/18   Carrie Mew, MD     Allergies Cortisone   Family History  Problem Relation Age of Onset  . Stroke Mother     Social History Social History   Tobacco Use  .  Smoking status: Never Smoker  . Smokeless tobacco: Never Used  Substance Use Topics  . Alcohol use: No  . Drug use: No    Review of Systems  Constitutional:   No fever or chills.  ENT:   No sore throat. No rhinorrhea. Cardiovascular:   No chest pain or syncope.  Positive fast heart rate Respiratory:   No dyspnea or cough. Gastrointestinal:   Negative for abdominal pain, vomiting and diarrhea.  Musculoskeletal:   Left hand swelling and pain All other systems reviewed and are negative except as documented above in ROS and HPI.  ____________________________________________   PHYSICAL EXAM:  VITAL SIGNS: ED Triage Vitals  Enc Vitals Group     BP  08/15/18 1344 (!) 154/89     Pulse Rate 08/15/18 1344 (!) 115     Resp 08/15/18 1344 20     Temp 08/15/18 1344 98.1 F (36.7 C)     Temp Source 08/15/18 1344 Oral     SpO2 08/15/18 1344 97 %     Weight 08/15/18 1353 203 lb (92.1 kg)     Height 08/15/18 1353 6' (1.829 m)     Head Circumference --      Peak Flow --      Pain Score 08/15/18 1344 7     Pain Loc --      Pain Edu? --      Excl. in [place]? --     Vital signs reviewed, nursing assessments reviewed.   Constitutional:   Alert and oriented. Non-toxic appearance. Eyes:   Conjunctivae are normal. EOMI. PERRL. ENT      Head:   Normocephalic and atraumatic.      Nose:   No congestion/rhinnorhea.       Mouth/Throat:   MMM, no pharyngeal erythema. No peritonsillar mass.       Neck:   No meningismus. Full ROM. Hematological/Lymphatic/Immunilogical:   No cervical lymphadenopathy. Cardiovascular:   Irregular rhythm, rate of 90-110. Symmetric bilateral radial and DP pulses.  No murmurs. Cap refill less than 2 seconds. Respiratory:   Normal respiratory effort without tachypnea/retractions. Breath sounds are clear and equal bilaterally. No wheezes/rales/rhonchi. Gastrointestinal:   Soft and nontender. Non distended. There is no CVA tenderness.  No rebound, rigidity, or guarding.  Musculoskeletal:   Normal range of motion in all extremities.  Diffuse swelling of left hand and wrist, tenderness at the wrist joint.  No erythema or skin induration.  No lower extremity tenderness Neurologic:   Normal speech and language.  Motor grossly intact. No acute focal neurologic deficits are appreciated.  Skin:    Skin is warm, dry and intact. No rash noted.  No petechiae, purpura, or bullae.  ____________________________________________    LABS (pertinent positives/negatives) (all labs ordered are listed, but only abnormal results are displayed) Labs Reviewed  CBC - Abnormal; Notable for the following components:      Result Value   RBC 5.85  (*)    All other components within normal limits  TROPONIN I - Abnormal; Notable for the following components:   Troponin I 0.03 (*)    All other components within normal limits  COMPREHENSIVE METABOLIC PANEL - Abnormal; Notable for the following components:   Glucose, Bld 125 (*)    Creatinine, Ser 1.84 (*)    GFR calc non Af Amer 33 (*)    GFR calc Af Amer 38 (*)    All other components within normal limits   ____________________________________________   EKG  Interpreted by me  Atrial fibrillation, rate of 107, normal axis and intervals.  Normal QRS ST segments and T waves.  ____________________________________________    RADIOLOGY  Dg Chest 2 View  Result Date: 08/15/2018 CLINICAL DATA:  77 y/o  M; tachycardia. EXAM: CHEST - 2 VIEW COMPARISON:  04/21/2018 chest radiograph. FINDINGS: Stable cardiomegaly given projection and technique. Calcific aortic atherosclerosis. No consolidation, effusion, or pneumothorax. No acute osseous abnormality is evident. IMPRESSION: Stable cardiomegaly and aortic atherosclerosis. No acute pulmonary process identified. Electronically Signed   By: Kristine Garbe M.D.   On: 08/15/2018 15:31    ____________________________________________   PROCEDURES Procedures  ____________________________________________    CLINICAL IMPRESSION / ASSESSMENT AND PLAN / ED COURSE  Pertinent labs & imaging results that were available during my care of the patient were reviewed by me and considered in my medical decision making (see chart for details).      Clinical Course as of Aug 16 1543  Mon Aug 15, 2018  1423 No acute medical sx. Currently afib, rate controlled at 100 bpm. Metoprolol 50mg  bid and diltiazem xr 180mg  daily ran out. Will refill today.    [PS]    Clinical Course User Index [PS] Carrie Mew, MD     ----------------------------------------- 3:44 PM on 08/15/2018 -----------------------------------------  Chest  x-ray unremarkable, labs unremarkable.  Refills waiting for the patient at his pharmacy.  I will give him a short course of prednisone for his gout as well.  Follow-up with primary care.  ____________________________________________   FINAL CLINICAL IMPRESSION(S) / ED DIAGNOSES    Final diagnoses:  Permanent atrial fibrillation  Acute gout of left wrist, unspecified cause     ED Discharge Orders         Ordered    predniSONE (DELTASONE) 20 MG tablet  Daily     08/15/18 1543          Portions of this note were generated with dragon dictation software. Dictation errors may occur despite best attempts at proofreading.    Carrie Mew, MD 08/15/18 [phone]

## 2018-08-15 NOTE — ED Triage Notes (Signed)
First Nurse Note:  Arrives from [facility] for evaluation of rapid afib.  Patient presented to George Bolton today for evaluation of gout pain.  On assessment patient HR in 150's.  Patient has been out of diltiazem and metoprolol x 2 months.

## 2018-08-15 NOTE — ED Notes (Signed)
Date and time results received: 08/15/18 1440 (use smartphrase ".now" to insert current time)  Test: troponin Critical Value: 0.03  Name of Provider Notified: stafford

## 2018-09-01 ENCOUNTER — Encounter: Payer: Self-pay | Admitting: Family

## 2018-09-01 ENCOUNTER — Ambulatory Visit: Payer: Medicare Other | Attending: Family | Admitting: Family

## 2018-09-01 VITALS — BP 171/93 | HR 110 | Resp 18 | Ht 72.0 in | Wt 202.2 lb

## 2018-09-01 DIAGNOSIS — Z888 Allergy status to other drugs, medicaments and biological substances status: Secondary | ICD-10-CM | POA: Insufficient documentation

## 2018-09-01 DIAGNOSIS — Z79899 Other long term (current) drug therapy: Secondary | ICD-10-CM | POA: Insufficient documentation

## 2018-09-01 DIAGNOSIS — I110 Hypertensive heart disease with heart failure: Secondary | ICD-10-CM | POA: Diagnosis not present

## 2018-09-01 DIAGNOSIS — I5032 Chronic diastolic (congestive) heart failure: Secondary | ICD-10-CM

## 2018-09-01 DIAGNOSIS — Z9049 Acquired absence of other specified parts of digestive tract: Secondary | ICD-10-CM | POA: Diagnosis not present

## 2018-09-01 DIAGNOSIS — I10 Essential (primary) hypertension: Secondary | ICD-10-CM

## 2018-09-01 DIAGNOSIS — I482 Chronic atrial fibrillation, unspecified: Secondary | ICD-10-CM | POA: Insufficient documentation

## 2018-09-01 DIAGNOSIS — Z7982 Long term (current) use of aspirin: Secondary | ICD-10-CM | POA: Diagnosis not present

## 2018-09-01 DIAGNOSIS — I890 Lymphedema, not elsewhere classified: Secondary | ICD-10-CM | POA: Diagnosis not present

## 2018-09-01 DIAGNOSIS — Z823 Family history of stroke: Secondary | ICD-10-CM | POA: Diagnosis not present

## 2018-09-01 DIAGNOSIS — I509 Heart failure, unspecified: Secondary | ICD-10-CM | POA: Diagnosis present

## 2018-09-01 MED ORDER — HYDRALAZINE HCL 25 MG PO TABS: 25.0000 mg | ORAL_TABLET | Freq: Three times a day (TID) | ORAL | 3 refills | Status: DC

## 2018-09-01 NOTE — Progress Notes (Signed)
Patient ID: George Bolton, male    DOB: [birth year], 77 y.o.   MRN: [id number]  HPI  George Bolton is a 77 y/o male with a history of HTN, atrial fibrillation, gout and chronic heart failure.   Echo report from 03/03/18 reviewed and showed an EF of 60-65%.   Was in the ED 08/15/18 due to atrial fibrillation due to being out of metoprolol and cardizem for ~ 2 months. Refills provided and he was released. Admitted 04/21/18 due to sepsis due to UTI. Treated with antibiotics and was discharged after 3 days. Was in the ED 04/07/18 due to acute cystitis where he was treated and released. Admitted 02/28/18 due to acute on chronic HF. Cardiology consult obtained. Initially needed IV lasix and then transitioned to oral diuretics. Due to decrease in LV capacity, watch for overdiuresis. Discharged after 4 days.   He presents today for a follow-up visit with a chief complaint of minimal fatigue upon moderate exertion. He describes this as chronic in nature having been present for several years. He has associated pedal edema, joint pain and difficulty sleeping along with this. He denies any dizziness, abdominal distention, palpitations, shortness of breath, cough or weight gain. He says that he stopped taking his cardizem ~ 1 month ago because it caused swelling in his legs (edema is still present) and that he wanted to go back on his hydralazine for his BP and his swelling.   Past Medical History:  Diagnosis Date  . Chronic atrial fibrillation    a. Afib dates back to at least 2002 when reviewing prior EKG with EKGs from 2014 onward showing Afib; b. CHADS2VASc at least 4 (CHF, HTN, age x 2)  . Chronic diastolic CHF (congestive heart failure) ([place])    a. TTE 5/19: EF 70-75%, mod LVH, near complete obliteration of the mid and apical LV cavity (can be seen in apical hypertrophic CM), mildly dilated LA, RV cavity size nl, RV wall thickness nl, RVSF mildly reduced, mildly dilated RA; b. 02/2018 Limited echo w/ definity: EF  50-65%, no apical hypertrophic CM.  Marland Kitchen Gout   . Hypertension    Past Surgical History:  Procedure Laterality Date  . CHOLECYSTECTOMY N/A 01/13/2017   Procedure: LAPAROSCOPIC CHOLECYSTECTOMY;  Surgeon: Jules Husbands, MD;  Location: [facility] ORS;  Service: General;  Laterality: N/A;   Family History  Problem Relation Age of Onset  . Stroke Mother    Social History   Tobacco Use  . Smoking status: Never Smoker  . Smokeless tobacco: Never Used  Substance Use Topics  . Alcohol use: No   Allergies  Allergen Reactions  . Cortisone    Past Medical History:  Diagnosis Date  . Chronic atrial fibrillation    a. Afib dates back to at least 2002 when reviewing prior EKG with EKGs from 2014 onward showing Afib; b. CHADS2VASc at least 4 (CHF, HTN, age x 2)  . Chronic diastolic CHF (congestive heart failure) ([place])    a. TTE 5/19: EF 70-75%, mod LVH, near complete obliteration of the mid and apical LV cavity (can be seen in apical hypertrophic CM), mildly dilated LA, RV cavity size nl, RV wall thickness nl, RVSF mildly reduced, mildly dilated RA; b. 02/2018 Limited echo w/ definity: EF 50-65%, no apical hypertrophic CM.  Marland Kitchen Gout   . Hypertension    Past Surgical History:  Procedure Laterality Date  . CHOLECYSTECTOMY N/A 01/13/2017   Procedure: LAPAROSCOPIC CHOLECYSTECTOMY;  Surgeon: Jules Husbands, MD;  Location: [facility]  ORS;  Service: General;  Laterality: N/A;   Family History  Problem Relation Age of Onset  . Stroke Mother    Social History   Tobacco Use  . Smoking status: Never Smoker  . Smokeless tobacco: Never Used  Substance Use Topics  . Alcohol use: No   Allergies  Allergen Reactions  . Cortisone    Prior to Admission medications   Medication Sig Start Date End Date Taking? Authorizing Provider  aspirin 325 MG tablet Take 325 mg by mouth daily.   Yes [provider]  felodipine (PLENDIL) 10 MG 24 hr tablet Take 10 mg by mouth daily.   Yes [provider]   furosemide (LASIX) 40 MG tablet Take 1 tablet (40 mg total) by mouth daily. 04/24/18  Yes Hillary Bow, MD  metoprolol tartrate (LOPRESSOR) 50 MG tablet Take 1 tablet (50 mg total) by mouth 2 (two) times daily. 08/12/18  Yes Hackney, Otila Kluver A, FNP  atorvastatin (LIPITOR) 40 MG tablet Take 1 tablet by mouth daily.    [provider]  diltiazem (CARDIZEM CD) 180 MG 24 hr capsule Take 1 capsule (180 mg total) by mouth daily. Patient not taking: Reported on 09/01/2018 08/11/18   Alisa Graff, FNP    Review of Systems  Constitution: Positive for malaise/fatigue. Negative for decreased appetite.  HENT: Negative for congestion, hoarse voice and sore throat.   Eyes: Negative.   Cardiovascular: Positive for leg swelling. Negative for chest pain, dyspnea on exertion and palpitations.  Respiratory: Negative for cough, shortness of breath and wheezing.   Endocrine: Negative.   Hematologic/Lymphatic: Negative.   Skin: Negative.   Musculoskeletal: Positive for joint pain (left hand) and neck pain.  Gastrointestinal: Negative for bloating and abdominal pain.  Genitourinary: Negative.   Neurological: Negative for dizziness, light-headedness and weakness.  Psychiatric/Behavioral: Negative for depression. The patient has insomnia (waking up every 3-4 hours). The patient is not nervous/anxious.   Allergic/Immunologic: Negative.    Vitals:   09/01/18 1355  BP: (!) 171/93  Pulse: (!) 110  Resp: 18  SpO2: 99%  Weight: 202 lb 4 oz (91.7 kg)  Height: 6' (1.829 m)   Wt Readings from Last 3 Encounters:  09/01/18 202 lb 4 oz (91.7 kg)  08/15/18 203 lb (92.1 kg)  07/21/18 201 lb 2 oz (91.2 kg)   Lab Results  Component Value Date   CREATININE 1.84 (H) 08/15/2018   CREATININE 1.80 (H) 06/22/2018   CREATININE 1.62 (H) 04/23/2018    Physical Exam  Constitutional: He is oriented to person, place, and time. He appears well-developed and well-nourished.  HENT:  Head: Normocephalic and  atraumatic.  Neck: Normal range of motion. Neck supple. No JVD present.  Cardiovascular: An irregular rhythm present. Tachycardia present.  Pulmonary/Chest: Effort normal. No respiratory distress. He has no wheezes. He has no rales.  Abdominal: Soft. He exhibits no distension.  Musculoskeletal: He exhibits edema (2+ pitting edema with L>R). He exhibits no tenderness.  Neurological: He is alert and oriented to person, place, and time.  Skin: Skin is warm and dry.  Psychiatric: He has a normal mood and affect. His behavior is normal.  Nursing note and vitals reviewed.  Assessment & Plan:  1: Chronic heart failure with preserved ejection fraction- - NYHA class II - mildly fluid overloaded with continued pedal edema - weighing daily and says that his weight has been stable. Reminded to call for an overnight weight gain of >2 pounds or a weekly weight gain of >5  pounds - weight up 1 pound from last visit here 6 weeks ago - not adding salt; Reminded to keep daily sodium intake to 2000mg  a day  - explained that his felodipine could be contributing to his edema but he doesn't believe that and is insistent that the cardizem did it even though his legs are still swollen after not being on cardizem for ~ a month - BNP 01/11/17 was 530.0 - he has not taken the flu vaccine yet - made follow-up appointment with cardiology 3 months out (per patient request); not entirely sure he will keep the appointment  2: HTN- - BP elevated today but he says that he hasn't taken his diuretic yet today - will resume hydralazine 25mg  TID - follows with PCP Posey Pronto at [facility]) regarding this - BMP 08/15/18 reviewed and showed sodium 144, potassium 4.1, creatinine 1.84 and GFR 38  3: Lymphedema- - stage 2 - has been elevating legs when sitting for long periods of time - has been wearing compression socks intermittently but edema continues - encouraged him to increase his activity - is unable to afford  the copay for the compression boots; patient is 100% committed to wearing them yet so will continue to discuss and once he will commit to wearing them consistently, can ask the charitable foundation if they can assist with payment  Medication bottles were reviewed.  Return in 2 months or sooner for any questions/problems before then.

## 2018-09-02 ENCOUNTER — Encounter: Payer: Self-pay | Admitting: Family

## 2018-09-13 ENCOUNTER — Ambulatory Visit (INDEPENDENT_AMBULATORY_CARE_PROVIDER_SITE_OTHER): Payer: Medicare Other | Admitting: Urology

## 2018-09-13 ENCOUNTER — Ambulatory Visit
Admission: RE | Admit: 2018-09-13 | Discharge: 2018-09-13 | Disposition: A | Discharge status: Home / Self Care | Payer: Medicare Other | Source: Ambulatory Visit | Attending: Urology | Admitting: Urology

## 2018-09-13 ENCOUNTER — Encounter: Payer: Self-pay | Admitting: Urology

## 2018-09-13 VITALS — BP 136/81 | HR 102 | Ht 72.0 in | Wt 199.8 lb

## 2018-09-13 DIAGNOSIS — N432 Other hydrocele: Secondary | ICD-10-CM | POA: Diagnosis not present

## 2018-09-13 DIAGNOSIS — B356 Tinea cruris: Secondary | ICD-10-CM | POA: Insufficient documentation

## 2018-09-13 DIAGNOSIS — N503 Cyst of epididymis: Secondary | ICD-10-CM | POA: Diagnosis not present

## 2018-09-13 DIAGNOSIS — N433 Hydrocele, unspecified: Secondary | ICD-10-CM | POA: Insufficient documentation

## 2018-09-13 MED ORDER — NYSTATIN 100000 UNIT/GM EX POWD
Freq: Two times a day (BID) | CUTANEOUS | 3 refills | Status: DC
Start: 2018-09-13 — End: 2019-09-26

## 2018-09-13 NOTE — Progress Notes (Signed)
09/13/2018 2:05 PM   George Bolton 1941/06/29 [id number]  Referring provider: Denton Lank, MD [address] [place], [place] [address]  Chief Complaint  Patient presents with  . testicle rash    left side, rash has been there for the past x 2 days     HPI: Pt is a 77 y.o. African American male presenting today with complaints of a rash on/around his left testicle.   He was last seen in our office on 07/21/2018 to complete his hematuria work up with a cysto with Dr. Erlene Quan.  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    He reports observing a boil around the time he noticed the rash.  He reports itching. Denies fever, chills and any other symptoms. Does not shave. Pt has attempted to use creams to no avail and experiences tenderness upon palpation.  He has noticed a drainage on his underwear.  He noticed this two days ago.     PMH: Past Medical History:  Diagnosis Date  . Chronic atrial fibrillation    a. Afib dates back to at least 2002 when reviewing prior EKG with EKGs from 2014 onward showing Afib; b. CHADS2VASc at least 4 (CHF, HTN, age x 2)  . Chronic diastolic CHF (congestive heart failure) ([place])    a. TTE 5/19: EF 70-75%, mod LVH, near complete obliteration of the mid and apical LV cavity (can be seen in apical hypertrophic CM), mildly dilated LA, RV cavity size nl, RV wall thickness nl, RVSF mildly reduced, mildly dilated RA; b. 02/2018 Limited echo w/ definity: EF 50-65%, no apical hypertrophic CM.  Marland Kitchen Gout   . Hypertension     Surgical History: Past Surgical History:  Procedure Laterality Date  . CHOLECYSTECTOMY N/A 01/13/2017   Procedure: LAPAROSCOPIC CHOLECYSTECTOMY;  Surgeon: Jules Husbands, MD;  Location: [facility] ORS;  Service: General;  Laterality: N/A;    Home Medications:  Allergies as of 09/13/2018      Reactions   Cortisone       Medication List        Accurate as of 09/13/18  2:05  PM. Always use your most recent med list.          aspirin 325 MG tablet Take 325 mg by mouth daily.   atorvastatin 40 MG tablet Commonly known as:  LIPITOR Take 1 tablet by mouth daily.   diltiazem 180 MG 24 hr capsule Commonly known as:  CARDIZEM CD Take 1 capsule (180 mg total) by mouth daily.   felodipine 10 MG 24 hr tablet Commonly known as:  PLENDIL Take 10 mg by mouth daily.   furosemide 40 MG tablet Commonly known as:  LASIX Take 1 tablet (40 mg total) by mouth daily.   hydrALAZINE 25 MG tablet Commonly known as:  APRESOLINE Take 1 tablet (25 mg total) by mouth 3 (three) times daily.   metoprolol tartrate 50 MG tablet Commonly known as:  LOPRESSOR Take 1 tablet (50 mg total) by mouth 2 (two) times daily.   nystatin powder Commonly known as:  MYCOSTATIN/NYSTOP Apply topically 2 (two) times daily.       Allergies:  Allergies  Allergen Reactions  . Cortisone     Family History: Family History  Problem Relation Age of Onset  . Stroke Mother     Social History:  reports that he has never smoked. He has never used smokeless tobacco.  He reports that he does not drink alcohol or use drugs.  ROS: UROLOGY Frequent Urination?: No Hard to postpone urination?: No Burning/pain with urination?: No Get up at night to urinate?: Yes Leakage of urine?: No Urine stream starts and stops?: No Trouble starting stream?: No Do you have to strain to urinate?: No Blood in urine?: No Urinary tract infection?: No Sexually transmitted disease?: No Injury to kidneys or bladder?: No Painful intercourse?: No Weak stream?: No Erection problems?: No Penile pain?: No  Gastrointestinal Nausea?: No Vomiting?: No Indigestion/heartburn?: No Diarrhea?: No Constipation?: No  Constitutional Fever: No Night sweats?: No Weight loss?: No Fatigue?: No  Skin Skin rash/lesions?: Yes Itching?: Yes  Eyes Blurred vision?: No Double vision?: No  Ears/Nose/Throat Sore  throat?: No Sinus problems?: No  Hematologic/Lymphatic Swollen glands?: No Easy bruising?: No  Cardiovascular Leg swelling?: No Chest pain?: No  Respiratory Cough?: No Shortness of breath?: No  Endocrine Excessive thirst?: No  Musculoskeletal Back pain?: No Joint pain?: No  Neurological Headaches?: Yes Dizziness?: No  Psychologic Depression?: No Anxiety?: No  Physical Exam: BP 136/81 (BP Location: Left Arm, Patient Position: Sitting, Cuff Size: Normal)   Pulse (!) 102   Ht 6' (1.829 m)   Wt 199 lb 12.8 oz (90.6 kg)   BMI 27.10 kg/m   Constitutional:  Well nourished. Alert and oriented, No acute distress. GU: No CVA tenderness.  No bladder fullness or masses.  Patient with buried phallus. Urethral meatus is patent.  No penile discharge. No penile lesions or rashes. Left Scrotum with erythematous, scaling plaques on left medial thighs, and left inguinal folds with areas of clearing with the large plaques with a yeasty paste.  Testicles and epididymis could not be palpated due to bilateral hydroceles. Lymph: No inguinal adenopathy. Neurologic: Grossly intact, no focal deficits, moving all 4 extremities. Psychiatric: Normal mood and affect.  Laboratory Data: Lab Results  Component Value Date   WBC 7.8 08/15/2018   HGB 16.4 08/15/2018   HCT 49.8 08/15/2018   MCV 85.1 08/15/2018   PLT 183 08/15/2018    Lab Results  Component Value Date   CREATININE 1.84 (H) 08/15/2018    No results found for: PSA  No results found for: TESTOSTERONE  Lab Results  Component Value Date   HGBA1C 6.6 (H) 04/21/2018    Lab Results  Component Value Date   TSH 1.394 04/21/2018    No results found for: CHOL, HDL, CHOLHDL, VLDL, LDLCALC  Lab Results  Component Value Date   AST 22 08/15/2018   Lab Results  Component Value Date   ALT 10 08/15/2018   No components found for: ALKALINEPHOPHATASE No components found for: BILIRUBINTOTAL  No results found for:  ESTRADIOL  Urinalysis    Component Value Date/Time   COLORURINE YELLOW (A) 04/21/2018 1910   APPEARANCEUR Clear 07/21/2018 0959   LABSPEC 1.011 04/21/2018 1910   LABSPEC 1.014 09/23/2012 1438   PHURINE 5.0 04/21/2018 1910   GLUCOSEU Negative 07/21/2018 0959   GLUCOSEU 50 mg/dL 09/23/2012 1438   HGBUR LARGE (A) 04/21/2018 1910   BILIRUBINUR Negative 07/21/2018 0959   BILIRUBINUR Negative 09/23/2012 1438   [place] 04/21/2018 1910   PROTEINUR 2+ (A) 07/21/2018 0959   PROTEINUR 100 (A) 04/21/2018 1910   NITRITE Negative 07/21/2018 0959   NITRITE NEGATIVE 04/21/2018 1910   LEUKOCYTESUR Negative 07/21/2018 0959   LEUKOCYTESUR Negative 09/23/2012 1438    I have reviewed the labs.  Pertinent Imaging CLINICAL DATA:  Scrotal pain and rash bilaterally, BILATERAL  hydrocele  EXAM: SCROTAL ULTRASOUND  DOPPLER ULTRASOUND OF THE TESTICLES  TECHNIQUE: Complete ultrasound examination of the testicles, epididymis, and other scrotal structures was performed. Color and spectral Doppler ultrasound were also utilized to evaluate blood flow to the testicles.  COMPARISON:  None  FINDINGS: Right testicle  Measurements: 4.7 x 2.5 x 3.4 cm. Focal area of heterogeneous predominately hypoechoic echogenicity consist with tubular ectasia of the rete testis. Small RIGHT testicular cyst identified 5 x 5 x 6 mm, containing a few scattered internal echoes. No additional mass lesion or calcification. Internal blood flow present on color Doppler imaging.  Left testicle  Measurements: 4.3 x 2.1 x 2.8 cm. Normal morphology without mass or calcification. Internal blood flow present on color Doppler imaging.  Right epididymis: Multiple cysts throughout RIGHT epididymis largest 4.9 x 3.0 x 3.6 cm at head. Observed cysts contain a few scattered low level internal echoes.  Left epididymis: Several LEFT epididymal cysts are identified largest at body 6 x 8 x 10  mm.  Hydrocele: Large LEFT hydrocele containing minimal internal debris. Minimal RIGHT hydrocele fluid.  Varicocele:  None visualized.  Pulsed Doppler interrogation of both testes demonstrates normal low resistance arterial and venous waveforms bilaterally.  Incidentally noted scrotal skin thickening.  IMPRESSION: Large LEFT hydrocele.  Multiple large cysts in RIGHT epididymis either representing spermatoceles or epididymal cysts.  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  Few small cysts within LEFT epididymis.   Electronically Signed   By: Lavonia Dana M.D.   On: 09/13/2018 15:24 I have independently reviewed the films and note a left hydrocele, multiple cysts in the right epididymis and no infiltration of surface infection into the scrotum.    Assessment & Plan:  1. Bilateral Hydrocele - Ultrasound to be completed today of scrotum to be sure of extent of the rash - results above  2. Tinea cruris on scrotum - We will prescribe Nystatin for his symptoms - Explained to patient how to use Nystatin powder as well as the importance of keeping area dry. - Will arrange home health care, to clean, dry and apply powder to area 2x daily - Follow up in about a week to check progress   Return in about 1 week (around 09/20/2018) for recheck .  These notes generated with voice recognition software. I apologize for typographical errors.  Laneta Simmers   [place] [address]  [place], [place] [address] [phone]  I, Temidayo Atanda-Ogunleye , am acting as a Education administrator for [employer], PA-C.   I have reviewed the above documentation for accuracy and completeness, and I agree with the above.    Zara Council, PA-C

## 2018-09-14 ENCOUNTER — Telehealth: Payer: Self-pay | Admitting: *Deleted

## 2018-09-14 NOTE — Telephone Encounter (Signed)
Please let George Bolton know that his scrotal ultrasound was okay. The infection is just on the skin. Does he have home health arranged? Per [name].   Left message for patient to call back .

## 2018-09-14 NOTE — Telephone Encounter (Signed)
Notified patient as instructed, patient pleased. Patient state his wife is helping him and she don't need any help . The patient is aware to call back for any questions or concerns.

## 2018-09-20 ENCOUNTER — Telehealth: Payer: Self-pay | Admitting: Urology

## 2018-09-20 NOTE — Telephone Encounter (Signed)
Would you schedule a follow up appointment for George Bolton next week since [facility] services are not covered by his insurance?

## 2018-09-21 NOTE — Telephone Encounter (Signed)
Called patient to let him know about app   George Bolton

## 2018-09-27 ENCOUNTER — Ambulatory Visit (INDEPENDENT_AMBULATORY_CARE_PROVIDER_SITE_OTHER): Payer: Medicare Other | Admitting: Urology

## 2018-09-27 ENCOUNTER — Encounter: Payer: Self-pay | Admitting: Urology

## 2018-09-27 VITALS — BP 132/94 | HR 82 | Ht 72.0 in | Wt 198.0 lb

## 2018-09-27 DIAGNOSIS — B356 Tinea cruris: Secondary | ICD-10-CM

## 2018-09-27 NOTE — Progress Notes (Signed)
09/27/2018 11:55 AM   George Bolton 1941/03/24 [id number]  Referring provider: Denton Lank, MD [address] [place], [place] [address]  Chief Complaint  Patient presents with  . Follow-up    HPI: Pt is a 77 y.o. African American male presenting today for recheck on his tinea cruris.    He was last seen in our office on 07/21/2018 to complete his hematuria work up with a cysto with Dr. Erlene Quan.  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    On his visit on 09/13/2018, he reported observing a boil around the time he noticed the rash.  He reported itching. Denied fever, chills and any other symptoms. Does not shave. Pt had attempted to use creams to no avail and experienced tenderness upon palpation.  He had noticed a drainage on his underwear.  He noticed this two days ago.    Scrotal ultrasound on 09/13/2018 revealed large LEFT hydrocele.  Multiple large cysts in RIGHT epididymis either representing spermatoceles or epididymal cysts. 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  Few small cysts within LEFT epididymis.    Today, he states he is doing fine.  He has been applying the nystatin twice daily and using rubbing alcohol to clean the area.  He states the rash is gone and just has minor itching.  Patient denies any gross hematuria, dysuria or suprapubic/flank pain.  Patient denies any fevers, chills, nausea or vomiting.   PMH: Past Medical History:  Diagnosis Date  . Chronic atrial fibrillation    a. Afib dates back to at least 2002 when reviewing prior EKG with EKGs from 2014 onward showing Afib; b. CHADS2VASc at least 4 (CHF, HTN, age x 2)  . Chronic diastolic CHF (congestive heart failure) ([place])    a. TTE 5/19: EF 70-75%, mod LVH, near complete obliteration of the mid and apical LV cavity (can be seen in apical hypertrophic CM), mildly dilated LA, RV cavity size nl, RV wall  thickness nl, RVSF mildly reduced, mildly dilated RA; b. 02/2018 Limited echo w/ definity: EF 50-65%, no apical hypertrophic CM.  Marland Kitchen Gout   . Hypertension     Surgical History: Past Surgical History:  Procedure Laterality Date  . CHOLECYSTECTOMY N/A 01/13/2017   Procedure: LAPAROSCOPIC CHOLECYSTECTOMY;  Surgeon: Jules Husbands, MD;  Location: [facility] ORS;  Service: General;  Laterality: N/A;    Home Medications:  Allergies as of 09/27/2018      Reactions   Cortisone Anaphylaxis      Medication List        Accurate as of 09/27/18 11:55 AM. Always use your most recent med list.          aspirin 325 MG tablet Take 325 mg by mouth daily.   atorvastatin 40 MG tablet Commonly known as:  LIPITOR Take 1 tablet by mouth daily.   diltiazem 180 MG 24 hr capsule Commonly known as:  CARDIZEM CD Take 1 capsule (180 mg total) by mouth daily.   felodipine 10 MG 24 hr tablet Commonly known as:  PLENDIL Take 10 mg by mouth daily.   furosemide 40 MG tablet Commonly known as:  LASIX Take 1 tablet (40 mg total) by mouth daily.   hydrALAZINE 25 MG tablet Commonly known as:  APRESOLINE Take 1 tablet (25 mg total) by mouth 3 (three) times daily.   metoprolol tartrate 50 MG  tablet Commonly known as:  LOPRESSOR Take 1 tablet (50 mg total) by mouth 2 (two) times daily.   nystatin powder Commonly known as:  MYCOSTATIN/NYSTOP Apply topically 2 (two) times daily.       Allergies:  Allergies  Allergen Reactions  . Cortisone Anaphylaxis    Family History: Family History  Problem Relation Age of Onset  . Stroke Mother     Social History:  reports that he has never smoked. He has never used smokeless tobacco. He reports that he does not drink alcohol or use drugs.  ROS: UROLOGY Frequent Urination?: No Hard to postpone urination?: No Burning/pain with urination?: No Get up at night to urinate?: No Leakage of urine?: No Urine stream starts and stops?: No Trouble starting stream?:  No Do you have to strain to urinate?: No Blood in urine?: No Urinary tract infection?: No Sexually transmitted disease?: No Injury to kidneys or bladder?: No Painful intercourse?: No Weak stream?: No Erection problems?: No Penile pain?: No  Gastrointestinal Nausea?: No Vomiting?: No Indigestion/heartburn?: No Diarrhea?: No Constipation?: No  Constitutional Fever: No Night sweats?: No Weight loss?: No Fatigue?: No  Skin Skin rash/lesions?: No Itching?: Yes  Eyes Blurred vision?: No Double vision?: No  Ears/Nose/Throat Sore throat?: No Sinus problems?: No  Hematologic/Lymphatic Swollen glands?: No Easy bruising?: No  Cardiovascular Leg swelling?: No Chest pain?: No  Respiratory Cough?: No Shortness of breath?: No  Endocrine Excessive thirst?: No  Musculoskeletal Back pain?: No Joint pain?: No  Neurological Headaches?: No Dizziness?: No  Psychologic Depression?: No Anxiety?: No  Physical Exam: BP (!) 132/94 (BP Location: Right Arm, Patient Position: Sitting, Cuff Size: Normal)   Pulse 82   Ht 6' (1.829 m)   Wt 198 lb (89.8 kg)   BMI 26.85 kg/m   Constitutional: Well nourished. Alert and oriented, No acute distress. HEENT:  AT, moist mucus membranes. Trachea midline, no masses. Cardiovascular: No clubbing, cyanosis, or edema. Respiratory: Normal respiratory effort, no increased work of breathing. GU: No CVA tenderness.  No bladder fullness or masses.  Patient with buried phallus.  Scrotum without lesions, cysts, rashes and/or edema.  The tinea cruris has resolved.  Large left hydrocele is again noted.   Skin: No rashes, bruises or suspicious lesions. Neurologic: Grossly intact, no focal deficits, moving all 4 extremities. Psychiatric: Normal mood and affect.   Laboratory Data: Lab Results  Component Value Date   WBC 7.8 08/15/2018   HGB 16.4 08/15/2018   HCT 49.8 08/15/2018   MCV 85.1 08/15/2018   PLT 183 08/15/2018    Lab  Results  Component Value Date   CREATININE 1.84 (H) 08/15/2018    No results found for: PSA  No results found for: TESTOSTERONE  Lab Results  Component Value Date   HGBA1C 6.6 (H) 04/21/2018    Lab Results  Component Value Date   TSH 1.394 04/21/2018    No results found for: CHOL, HDL, CHOLHDL, VLDL, LDLCALC  Lab Results  Component Value Date   AST 22 08/15/2018   Lab Results  Component Value Date   ALT 10 08/15/2018   No components found for: ALKALINEPHOPHATASE No components found for: BILIRUBINTOTAL  No results found for: ESTRADIOL  Urinalysis    Component Value Date/Time   COLORURINE YELLOW (A) 04/21/2018 1910   APPEARANCEUR Clear 07/21/2018 0959   LABSPEC 1.011 04/21/2018 1910   LABSPEC 1.014 09/23/2012 1438   PHURINE 5.0 04/21/2018 1910   GLUCOSEU Negative 07/21/2018 0959   GLUCOSEU 50 mg/dL 09/23/2012 1438  HGBUR LARGE (A) 04/21/2018 1910   BILIRUBINUR Negative 07/21/2018 0959   BILIRUBINUR Negative 09/23/2012 1438   KETONESUR NEGATIVE 04/21/2018 1910   PROTEINUR 2+ (A) 07/21/2018 0959   PROTEINUR 100 (A) 04/21/2018 1910   NITRITE Negative 07/21/2018 0959   NITRITE NEGATIVE 04/21/2018 1910   LEUKOCYTESUR Negative 07/21/2018 0959   LEUKOCYTESUR Negative 09/23/2012 1438    I have reviewed the labs.   Assessment & Plan:  1. Tinea cruris on scrotum - Resolved   Return for keep appointment on 07/25/2019 with Dr. Erlene Quan .  These notes generated with voice recognition software. I apologize for typographical errors.  Laneta Simmers   [facility] [address]  [place], [place] [address] [phone]

## 2018-11-22 NOTE — Progress Notes (Signed)
Patient ID: George Bolton, male    DOB: [birth year], 79 y.o.   MRN: [id number]  HPI  Mr Walling is a 78 y/o male with a history of HTN, atrial fibrillation, gout and chronic heart failure.   Echo report from 03/03/18 reviewed and showed an EF of 60-65%.   Was in the ED 08/15/18 due to atrial fibrillation due to being out of metoprolol and cardizem for ~ 2 months. Refills provided and he was released.   He presents today for a follow-up visit with a chief complaint of minimal fatigue upon moderate exertion. He describes this as chronic in nature having been present for several years. He has associated pedal edema, difficulty sleeping and slight weight gain. He denies any cough, shortness of breath or dizziness. He did not bring his medications and isn't completely sure of what he's taking other than he knows he's not taking cardizem. Wants to know if his felodipine can be increased (explained that he is taking the max dose)  Past Medical History:  Diagnosis Date  . Chronic atrial fibrillation    a. Afib dates back to at least 2002 when reviewing prior EKG with EKGs from 2014 onward showing Afib; b. CHADS2VASc at least 4 (CHF, HTN, age x 2)  . Chronic diastolic CHF (congestive heart failure) ([place])    a. TTE 5/19: EF 70-75%, mod LVH, near complete obliteration of the mid and apical LV cavity (can be seen in apical hypertrophic CM), mildly dilated LA, RV cavity size nl, RV wall thickness nl, RVSF mildly reduced, mildly dilated RA; b. 02/2018 Limited echo w/ definity: EF 50-65%, no apical hypertrophic CM.  Marland Kitchen Gout   . Hypertension    Past Surgical History:  Procedure Laterality Date  . CHOLECYSTECTOMY N/A 01/13/2017   Procedure: LAPAROSCOPIC CHOLECYSTECTOMY;  Surgeon: Jules Husbands, MD;  Location: [facility] ORS;  Service: General;  Laterality: N/A;   Family History  Problem Relation Age of Onset  . Stroke Mother    Social History   Tobacco Use  . Smoking status: Never Smoker  . Smokeless  tobacco: Never Used  Substance Use Topics  . Alcohol use: No   Allergies  Allergen Reactions  . Cortisone Anaphylaxis   Past Medical History:  Diagnosis Date  . Chronic atrial fibrillation    a. Afib dates back to at least 2002 when reviewing prior EKG with EKGs from 2014 onward showing Afib; b. CHADS2VASc at least 4 (CHF, HTN, age x 2)  . Chronic diastolic CHF (congestive heart failure) ([place])    a. TTE 5/19: EF 70-75%, mod LVH, near complete obliteration of the mid and apical LV cavity (can be seen in apical hypertrophic CM), mildly dilated LA, RV cavity size nl, RV wall thickness nl, RVSF mildly reduced, mildly dilated RA; b. 02/2018 Limited echo w/ definity: EF 50-65%, no apical hypertrophic CM.  Marland Kitchen Gout   . Hypertension    Past Surgical History:  Procedure Laterality Date  . CHOLECYSTECTOMY N/A 01/13/2017   Procedure: LAPAROSCOPIC CHOLECYSTECTOMY;  Surgeon: Jules Husbands, MD;  Location: [facility] ORS;  Service: General;  Laterality: N/A;   Family History  Problem Relation Age of Onset  . Stroke Mother    Social History   Tobacco Use  . Smoking status: Never Smoker  . Smokeless tobacco: Never Used  Substance Use Topics  . Alcohol use: No   Allergies  Allergen Reactions  . Cortisone Anaphylaxis   Prior to Admission medications   Medication Sig Start Date End  Date Taking? Authorizing Provider  aspirin 325 MG tablet Take 325 mg by mouth daily.   Yes [provider]  atorvastatin (LIPITOR) 40 MG tablet Take 1 tablet by mouth daily.   Yes [provider]  felodipine (PLENDIL) 10 MG 24 hr tablet Take 10 mg by mouth daily.   Yes [provider]  furosemide (LASIX) 40 MG tablet Take 1 tablet (40 mg total) by mouth daily. 04/24/18  Yes Hillary Bow, MD  hydrALAZINE (APRESOLINE) 25 MG tablet Take 1 tablet (25 mg total) by mouth 3 (three) times daily. 09/01/18 05/29/19 Yes Darylene Price A, FNP  metoprolol tartrate (LOPRESSOR) 50 MG tablet Take 1 tablet (50 mg  total) by mouth 2 (two) times daily. 08/12/18  Yes Tiran Sauseda, Otila Kluver A, FNP  nystatin (MYCOSTATIN/NYSTOP) powder Apply topically 2 (two) times daily. 09/13/18  Yes McGowan, Hunt Oris, PA-C    Review of Systems  Constitution: Positive for malaise/fatigue. Negative for decreased appetite.  HENT: Negative for congestion, hoarse voice and sore throat.   Eyes: Negative.   Cardiovascular: Positive for leg swelling. Negative for chest pain, dyspnea on exertion and palpitations.  Respiratory: Negative for cough, shortness of breath and wheezing.   Endocrine: Negative.   Hematologic/Lymphatic: Negative.   Skin: Negative.   Musculoskeletal: Positive for joint pain (left hand) and neck pain.  Gastrointestinal: Negative for bloating and abdominal pain.  Genitourinary: Negative.   Neurological: Negative for dizziness, light-headedness and weakness.  Psychiatric/Behavioral: Negative for depression. The patient has insomnia (waking up every 3-4 hours). The patient is not nervous/anxious.   Allergic/Immunologic: Negative.    Vitals:   11/23/18 1104 11/23/18 1135  BP: (!) 153/106 122/70  Pulse: 83   Resp: (!) 22   Temp: 97.8 F (36.6 C)   TempSrc: Oral   SpO2: 99%   Weight: 205 lb (93 kg)   Height: 6' (1.829 m)    Wt Readings from Last 3 Encounters:  11/23/18 205 lb (93 kg)  09/27/18 198 lb (89.8 kg)  09/13/18 199 lb 12.8 oz (90.6 kg)   Lab Results  Component Value Date   CREATININE 1.84 (H) 08/15/2018   CREATININE 1.80 (H) 06/22/2018   CREATININE 1.62 (H) 04/23/2018    Physical Exam Vitals signs and nursing note reviewed.  Constitutional:      Appearance: He is well-developed.  HENT:     Head: Normocephalic and atraumatic.  Neck:     Musculoskeletal: Normal range of motion and neck supple.     Vascular: No JVD.  Cardiovascular:     Rate and Rhythm: Normal rate. Rhythm irregular.  Pulmonary:     Effort: Pulmonary effort is normal. No respiratory distress.     Breath sounds: No  wheezing or rales.  Abdominal:     General: There is no distension.     Palpations: Abdomen is soft.  Musculoskeletal:        General: No tenderness.     Right lower leg: Edema (1+) present.     Left lower leg: Edema (2+) present.  Skin:    General: Skin is warm and dry.  Neurological:     Mental Status: He is alert and oriented to person, place, and time.  Psychiatric:        Behavior: Behavior normal.    Assessment & Plan:  1: Chronic heart failure with preserved ejection fraction- - NYHA class II - euvolemic today - weighing daily and says that his weight has been stable. Reminded to call for an overnight weight  gain of >2 pounds or a weekly weight gain of >5 pounds - weight up 3 pounds since he was last here 2 months ago - not adding salt; Reminded to keep daily sodium intake to 2000mg  a day  - BNP 01/11/17 was 530.0 - he has not taken the flu vaccine yet - has appointment with [facility] 12/02/2018; not entirely sure he will keep it  2: HTN- - BP elevated initially (153/106) and had improved upon recheck with manual cuff (122/70) at the end of the visit - follows with PCP P[facility] Pr[facility] at [facility]) regarding this - BMP 08/15/18 reviewed and showed sodium 144, potassium 4.1, creatinine 1.84 and GFR 38  3: Lymphedema- - stage 2 - has been elevating legs when sitting for long periods of time - has been wearing compression socks intermittently but edema continues & he was encouraged to wear them daily with removal at bedtime - is unable to afford the copay for the compression boots; patient is not 100% committed to wearing them yet so will continue to discuss and once he will commit to wearing them consistently, can ask the charitable foundation if they can assist with payment  Patient didn't bring his medication bottles or his list and isn't entirely sure of what he's taking.   Will not make a return appointment at this time and he was instructed that he could call back at  anytime to make another appointment.

## 2018-11-23 ENCOUNTER — Ambulatory Visit: Payer: Medicare Other | Attending: Family | Admitting: Family

## 2018-11-23 ENCOUNTER — Encounter: Payer: Self-pay | Admitting: Family

## 2018-11-23 ENCOUNTER — Other Ambulatory Visit: Payer: Self-pay

## 2018-11-23 VITALS — BP 122/70 | HR 83 | Temp 97.8°F | Resp 22 | Ht 72.0 in | Wt 205.0 lb

## 2018-11-23 DIAGNOSIS — I5032 Chronic diastolic (congestive) heart failure: Secondary | ICD-10-CM

## 2018-11-23 DIAGNOSIS — I890 Lymphedema, not elsewhere classified: Secondary | ICD-10-CM

## 2018-11-23 DIAGNOSIS — I110 Hypertensive heart disease with heart failure: Secondary | ICD-10-CM | POA: Diagnosis present

## 2018-11-23 DIAGNOSIS — Z823 Family history of stroke: Secondary | ICD-10-CM | POA: Insufficient documentation

## 2018-11-23 DIAGNOSIS — Z9049 Acquired absence of other specified parts of digestive tract: Secondary | ICD-10-CM | POA: Insufficient documentation

## 2018-11-23 DIAGNOSIS — I482 Chronic atrial fibrillation, unspecified: Secondary | ICD-10-CM | POA: Insufficient documentation

## 2018-11-23 DIAGNOSIS — Z79899 Other long term (current) drug therapy: Secondary | ICD-10-CM | POA: Diagnosis not present

## 2018-11-23 DIAGNOSIS — Z7982 Long term (current) use of aspirin: Secondary | ICD-10-CM | POA: Insufficient documentation

## 2018-11-23 DIAGNOSIS — Z888 Allergy status to other drugs, medicaments and biological substances status: Secondary | ICD-10-CM | POA: Diagnosis not present

## 2018-11-23 DIAGNOSIS — I10 Essential (primary) hypertension: Secondary | ICD-10-CM

## 2018-11-23 NOTE — Patient Instructions (Signed)
Continue weighing daily and call for an overnight weight gain of > 2 pounds or a weekly weight gain of >5 pounds. 

## 2018-11-28 NOTE — Progress Notes (Incomplete)
NO SHOW

## 2018-12-01 DIAGNOSIS — I5033 Acute on chronic diastolic (congestive) heart failure: Secondary | ICD-10-CM | POA: Insufficient documentation

## 2018-12-01 DIAGNOSIS — I5032 Chronic diastolic (congestive) heart failure: Secondary | ICD-10-CM | POA: Insufficient documentation

## 2018-12-02 ENCOUNTER — Ambulatory Visit: Payer: Medicare Other | Admitting: Cardiovascular Disease

## 2018-12-05 ENCOUNTER — Encounter: Payer: Self-pay | Admitting: Cardiovascular Disease

## 2019-01-30 ENCOUNTER — Other Ambulatory Visit: Payer: Self-pay

## 2019-01-30 NOTE — Telephone Encounter (Signed)
Tried to reach out to patient to make himaware the Dr. Rockey Situ is limiting the amount of patients that are coming into the office.  Need to change to a EVISIT.  NO answer. NO voicemail

## 2019-02-01 ENCOUNTER — Telehealth: Payer: Self-pay

## 2019-02-01 NOTE — Telephone Encounter (Signed)
LMOV for patient to call back.  Patient needs appointment to be changed to an EVISIT

## 2019-02-08 NOTE — Telephone Encounter (Signed)
Changed appt to evisit with patient but he wants to think about consent .  Will call back to discuss.

## 2019-02-08 NOTE — Telephone Encounter (Signed)
Attempted to contact patient.  No answer. LMOV Patients appointment needs to be switched to an Evisit.

## 2019-02-22 ENCOUNTER — Telehealth: Payer: Medicare Other | Admitting: Cardiovascular Disease

## 2019-04-03 ENCOUNTER — Telehealth: Payer: Self-pay | Admitting: Urology

## 2019-04-03 NOTE — Telephone Encounter (Signed)
ERROR

## 2019-04-09 NOTE — Progress Notes (Deleted)
04/09/2019 5:07 PM   George Bolton 1941/08/08 [id number]  Referring provider: Denton Lank, MD [address] [place],  [place] [address]  No chief complaint on file.   HPI: Pt is a 78 y.o. African American male presenting today for a rash on his penis.    History of hematuria He was last seen in our office on 07/21/2018 to complete his hematuria work up with a cysto with Dr. Erlene Quan.  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    Hydrocele Scrotal ultrasound on 09/13/2018 revealed large LEFT hydrocele.  Multiple large cysts in RIGHT epididymis either representing spermatoceles or epididymal cysts. 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  Few small cysts within LEFT epididymis.    Rash    PMH: Past Medical History:  Diagnosis Date  . Chronic atrial fibrillation    a. Afib dates back to at least 2002 when reviewing prior EKG with EKGs from 2014 onward showing Afib; b. CHADS2VASc at least 4 (CHF, HTN, age x 2)  . Chronic diastolic CHF (congestive heart failure) ([place])    a. TTE 5/19: EF 70-75%, mod LVH, near complete obliteration of the mid and apical LV cavity (can be seen in apical hypertrophic CM), mildly dilated LA, RV cavity size nl, RV wall thickness nl, RVSF mildly reduced, mildly dilated RA; b. 02/2018 Limited echo w/ definity: EF 50-65%, no apical hypertrophic CM.  Marland Kitchen Gout   . Hypertension     Surgical History: Past Surgical History:  Procedure Laterality Date  . CHOLECYSTECTOMY N/A 01/13/2017   Procedure: LAPAROSCOPIC CHOLECYSTECTOMY;  Surgeon: Jules Husbands, MD;  Location: [facility] ORS;  Service: General;  Laterality: N/A;    Home Medications:  Allergies as of 04/10/2019      Reactions   Cortisone Anaphylaxis      Medication List       Accurate as of April 09, 2019  5:07 PM. If you have any questions, ask your nurse or doctor.        aspirin 325 MG tablet  Take 325 mg by mouth daily.   atorvastatin 40 MG tablet Commonly known as: LIPITOR Take 1 tablet by mouth daily.   felodipine 10 MG 24 hr tablet Commonly known as: PLENDIL Take 10 mg by mouth daily.   furosemide 40 MG tablet Commonly known as: LASIX Take 1 tablet (40 mg total) by mouth daily.   hydrALAZINE 25 MG tablet Commonly known as: APRESOLINE Take 1 tablet (25 mg total) by mouth 3 (three) times daily.   metoprolol tartrate 50 MG tablet Commonly known as: LOPRESSOR Take 1 tablet (50 mg total) by mouth 2 (two) times daily.   nystatin powder Commonly known as: MYCOSTATIN/NYSTOP Apply topically 2 (two) times daily.       Allergies:  Allergies  Allergen Reactions  . Cortisone Anaphylaxis    Family History: Family History  Problem Relation Age of Onset  . Stroke Mother     Social History:  reports that he has never smoked. He has never used smokeless tobacco. He reports that he does not drink alcohol or use drugs.  ROS:                                        Physical Exam: There were no vitals  taken for this visit.  Constitutional:  Well nourished. Alert and oriented, No acute distress. HEENT: [place] AT, moist mucus membranes.  Trachea midline, no masses. Cardiovascular: No clubbing, cyanosis, or edema. Respiratory: Normal respiratory effort, no increased work of breathing. GI: Abdomen is soft, non tender, non distended, no abdominal masses. Liver and spleen not palpable.  No hernias appreciated.  Stool sample for occult testing is not indicated.   GU: No CVA tenderness.  No bladder fullness or masses.  Patient with circumcised/uncircumcised phallus. ***Foreskin easily retracted***  Urethral meatus is patent.  No penile discharge. No penile lesions or rashes. Scrotum without lesions, cysts, rashes and/or edema.  Testicles are located scrotally bilaterally. No masses are appreciated in the testicles. Left and right epididymis are normal.  Rectal: Patient with  normal sphincter tone. Anus and perineum without scarring or rashes. No rectal masses are appreciated. Prostate is approximately *** grams, *** nodules are appreciated. Seminal vesicles are normal. Skin: No rashes, bruises or suspicious lesions. Lymph: No cervical or inguinal adenopathy. Neurologic: Grossly intact, no focal deficits, moving all 4 extremities. Psychiatric: Normal mood and affect.   Laboratory Data: Lab Results  Component Value Date   WBC 7.8 08/15/2018   HGB 16.4 08/15/2018   HCT 49.8 08/15/2018   MCV 85.1 08/15/2018   PLT 183 08/15/2018    Lab Results  Component Value Date   CREATININE 1.84 (H) 08/15/2018    No results found for: PSA  No results found for: TESTOSTERONE  Lab Results  Component Value Date   HGBA1C 6.6 (H) 04/21/2018    Lab Results  Component Value Date   TSH 1.394 04/21/2018    No results found for: CHOL, HDL, CHOLHDL, VLDL, LDLCALC  Lab Results  Component Value Date   AST 22 08/15/2018   Lab Results  Component Value Date   ALT 10 08/15/2018   No components found for: ALKALINEPHOPHATASE No components found for: BILIRUBINTOTAL  No results found for: ESTRADIOL  Urinalysis    Component Value Date/Time   COLORURINE YELLOW (A) 04/21/2018 1910   APPEARANCEUR Clear 07/21/2018 0959   LABSPEC 1.011 04/21/2018 1910   LABSPEC 1.014 09/23/2012 1438   PHURINE 5.0 04/21/2018 1910   GLUCOSEU Negative 07/21/2018 0959   GLUCOSEU 50 mg/dL 09/23/2012 1438   HGBUR LARGE (A) 04/21/2018 1910   BILIRUBINUR Negative 07/21/2018 0959   BILIRUBINUR Negative 09/23/2012 1438   [place] 04/21/2018 1910   PROTEINUR 2+ (A) 07/21/2018 0959   PROTEINUR 100 (A) 04/21/2018 1910   NITRITE Negative 07/21/2018 0959   NITRITE NEGATIVE 04/21/2018 1910   LEUKOCYTESUR Negative 07/21/2018 0959   LEUKOCYTESUR Negative 09/23/2012 1438    I have reviewed the labs.   Assessment & Plan:  1. Tinea cruris on scrotum -  Resolved   2. Complex renal cyst Will need follow up CT in 06/2019  3. Left hydrocele ***  No follow-ups on file.  These notes generated with voice recognition software. I apologize for typographical errors.  Laneta Simmers   [facility] [address]  [place], [place] [address] [phone]

## 2019-04-10 ENCOUNTER — Encounter: Payer: Self-pay | Admitting: Urology

## 2019-04-10 ENCOUNTER — Ambulatory Visit: Payer: Medicare Other | Admitting: Urology

## 2019-04-11 ENCOUNTER — Ambulatory Visit: Payer: Medicare Other | Admitting: Urology

## 2019-04-19 ENCOUNTER — Telehealth: Payer: Self-pay | Admitting: Urology

## 2019-04-19 NOTE — Telephone Encounter (Signed)
He needs to have an appointment with his PCP or [place] before we can prescribe any medication for him.

## 2019-04-19 NOTE — Telephone Encounter (Signed)
Do you want him to schedule an appointment for the rash?

## 2019-04-19 NOTE — Telephone Encounter (Signed)
Pt called and states that he has a rash around his testicles and leg, he would like to know if any meds can be called in.He states that he has seen [name] in the past for this. Please advise.

## 2019-04-20 NOTE — Telephone Encounter (Signed)
Left message with details, asked to return call with any questions.

## 2019-07-25 ENCOUNTER — Ambulatory Visit: Payer: Medicare Other | Admitting: Urology

## 2019-07-28 ENCOUNTER — Other Ambulatory Visit: Payer: Self-pay

## 2019-07-28 ENCOUNTER — Ambulatory Visit
Admission: RE | Admit: 2019-07-28 | Discharge: 2019-07-28 | Disposition: A | Discharge status: Home / Self Care | Payer: Medicare Other | Source: Ambulatory Visit | Attending: Urology | Admitting: Urology

## 2019-07-28 DIAGNOSIS — N281 Cyst of kidney, acquired: Secondary | ICD-10-CM | POA: Insufficient documentation

## 2019-07-28 LAB — POCT I-STAT CREATININE: Creatinine, Ser: 1.7 mg/dL — ABNORMAL HIGH (ref 0.61–1.24)

## 2019-07-28 IMAGING — CT CT ABDOMEN WO/W CM
3 of 12 series · 11 of 46 positions shown, 17 images · IV contrast (omnipaque)
Comparison: [DATE]

CLINICAL DATA: Followup hematuria

EXAM:
CT ABDOMEN WITHOUT AND WITH CONTRAST
TECHNIQUE: Multidetector CT imaging of the abdomen was performed following the
standard protocol before and following the bolus administration of
intravenous contrast.
CONTRAST:  100mL OMNIPAQUE IOHEXOL 300 MG/ML  SOLN

[Series 5: coronal without renal without · coronal · non-contrast · 0.52mm/px · 2 of 161 slices shown, 3 images]
[im 54/161  soft-tissue]
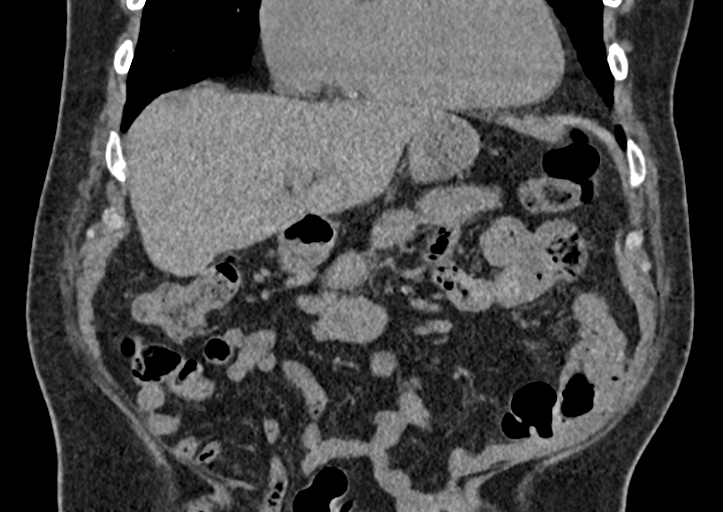
[im 54/161  bone]
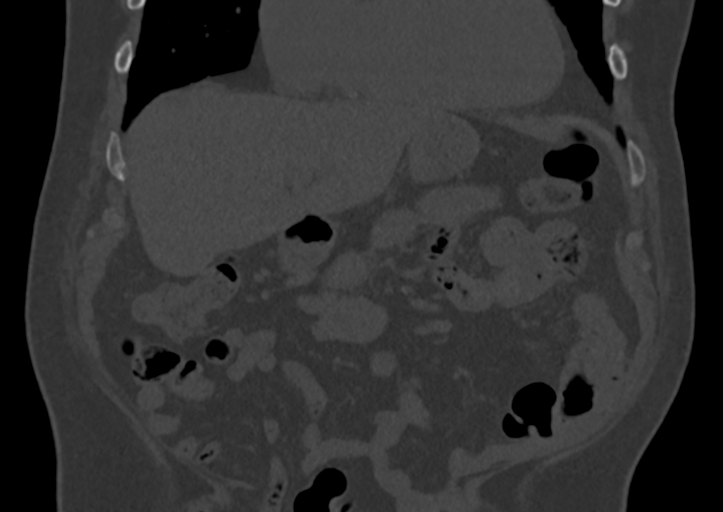
[im 107/161  soft-tissue]
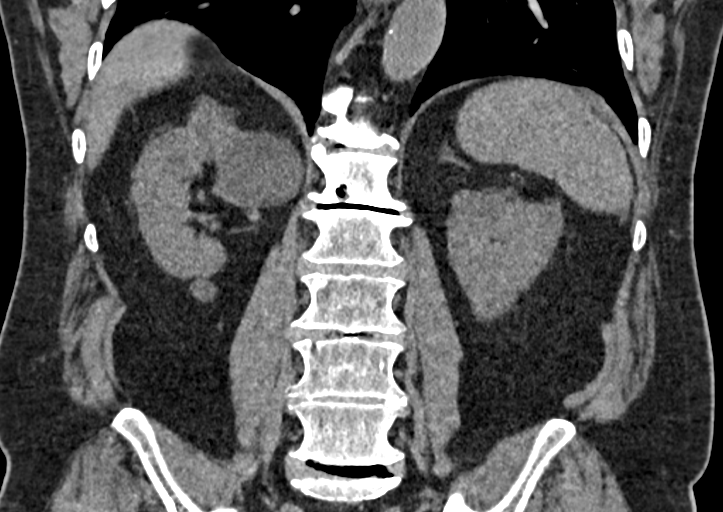

[Series 9: axial arterial renal arterial · axial · arterial · 0.73mm/px · z∈[-1341,-1155]mm · 6 of 131 slices shown, 11 images]
[im 19/131  soft-tissue]
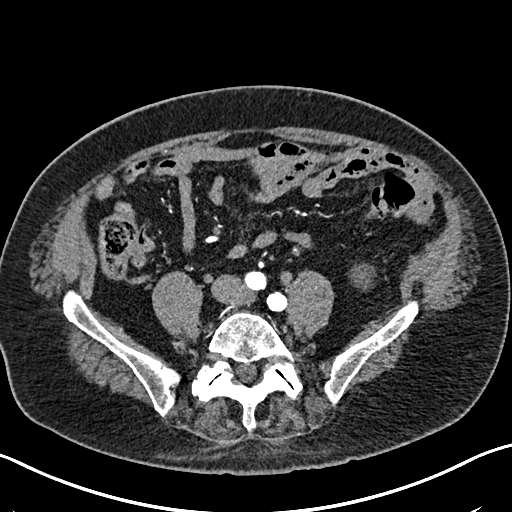
[im 19/131  bone]
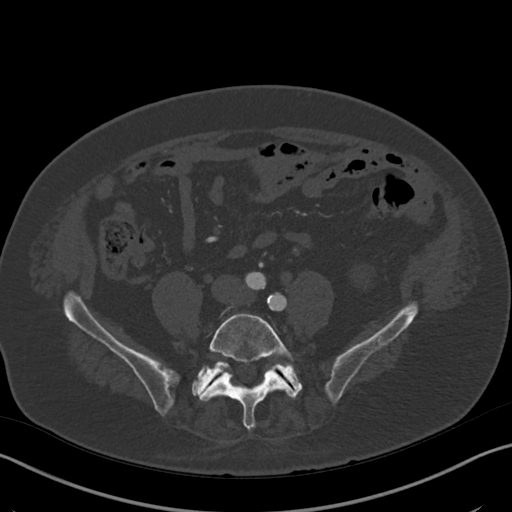
[im 38/131  soft-tissue]
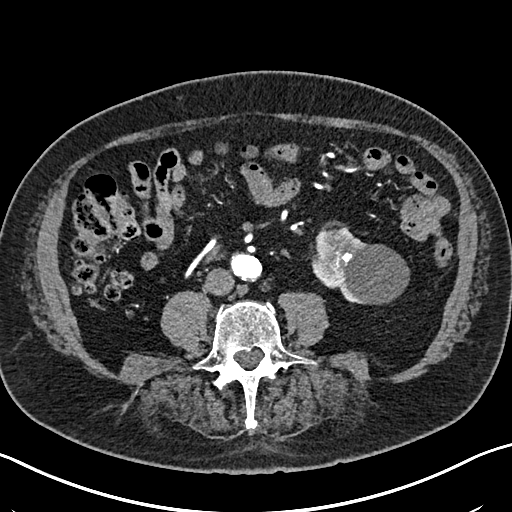
[im 56/131  soft-tissue]
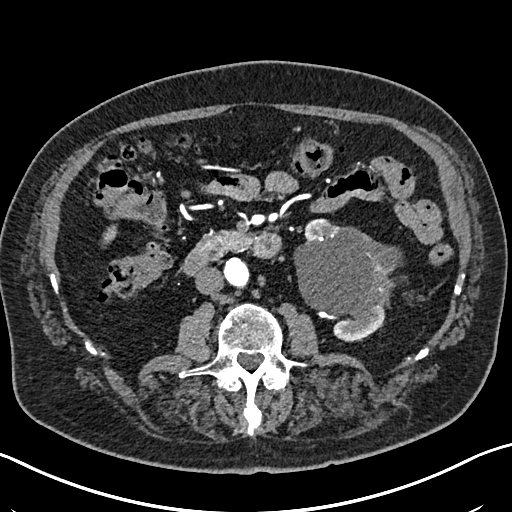
[im 56/131  lung]
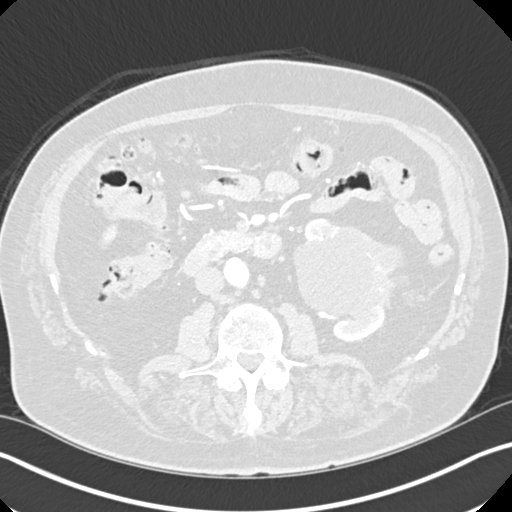
[im 75/131  soft-tissue]
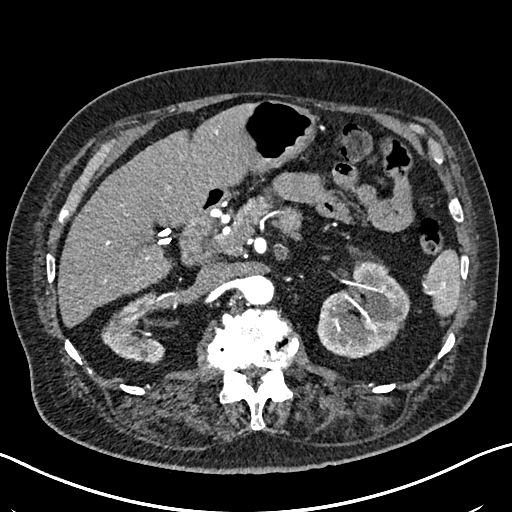
[im 75/131  lung]
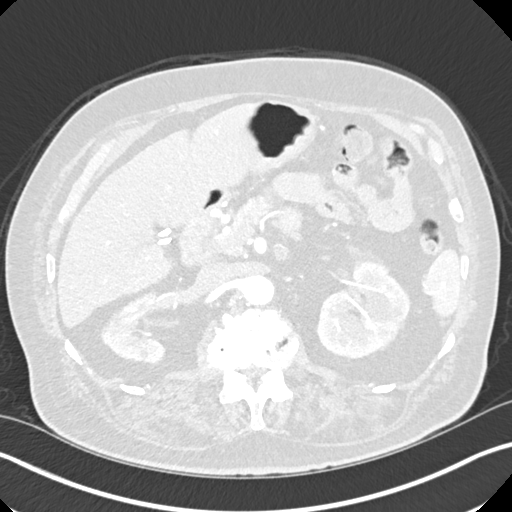
[im 93/131  soft-tissue]
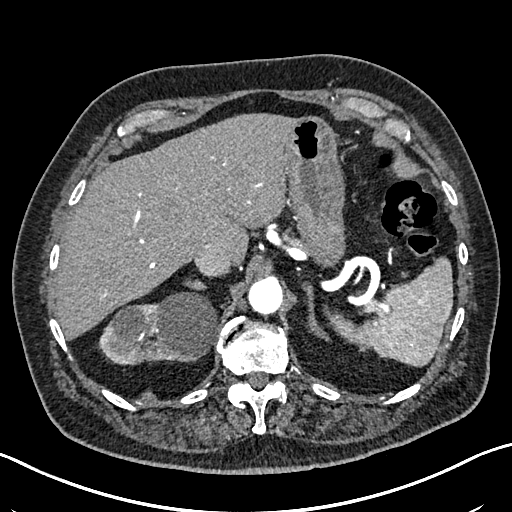
[im 93/131  lung]
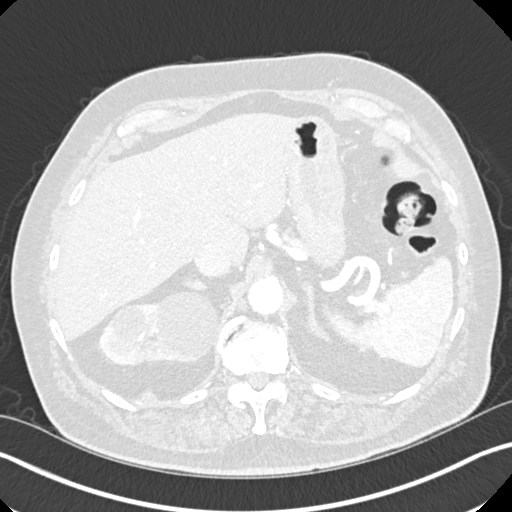
[im 112/131  soft-tissue]
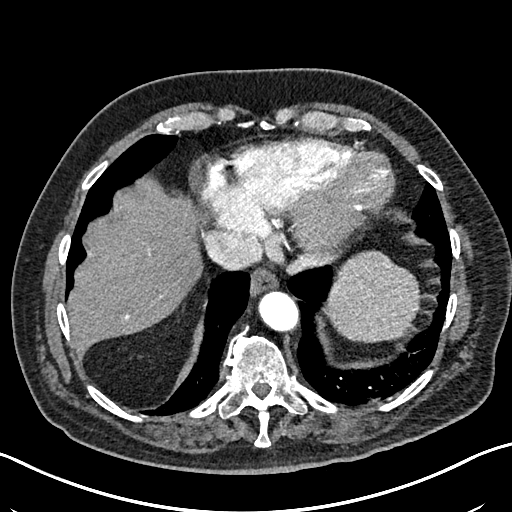
[im 112/131  lung]
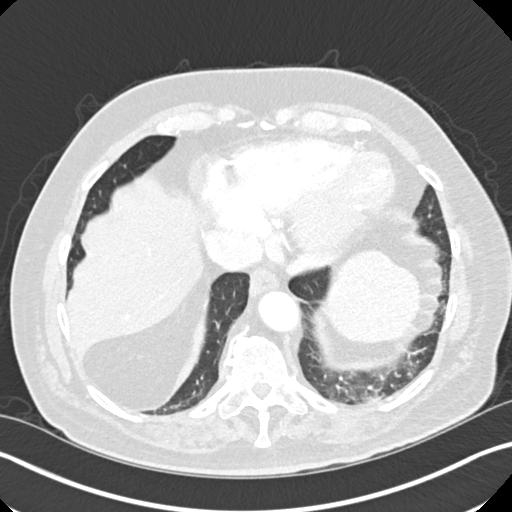

[Series 17: axial nephrographic renal nephrographic · axial · 0.73mm/px · z∈[-1341,-1267]mm · 3 of 131 slices shown]
[im 19/131  soft-tissue]
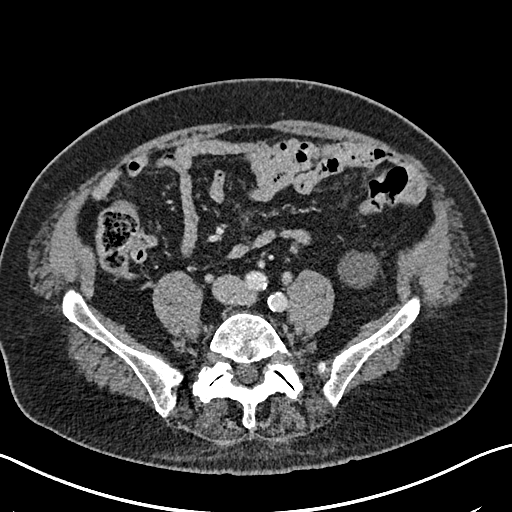
[im 38/131  soft-tissue]
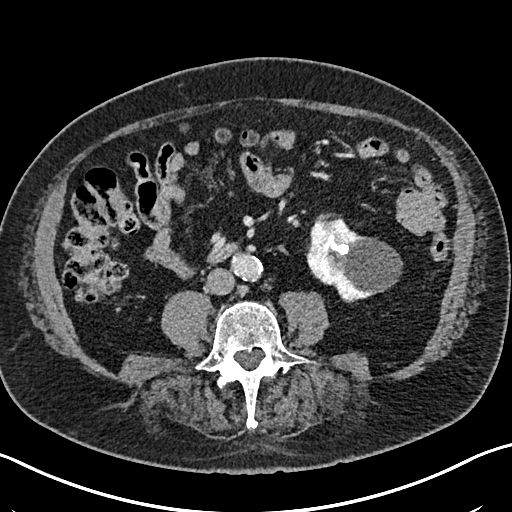
[im 56/131  soft-tissue]
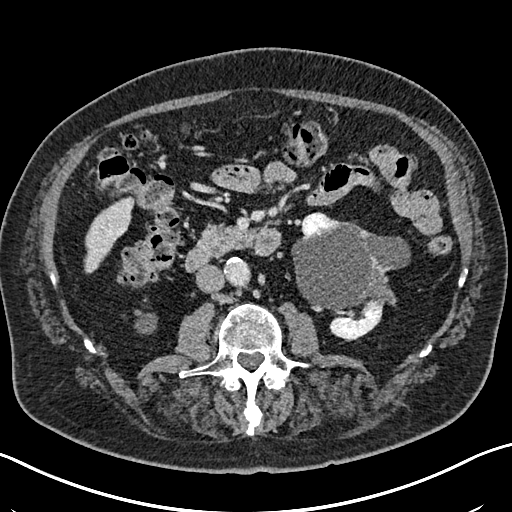

[11 of 46 positions shown; findings below may reference images not displayed]

FINDINGS: Lower chest: No acute abnormality.

Hepatobiliary: No focal liver abnormality. Cholecystectomy. No
biliary ductal dilatation.

Pancreas: Unremarkable. No pancreatic ductal dilatation or
surrounding inflammatory changes.

Spleen: Normal in size without focal abnormality.

Adrenals/Urinary Tract: Normal adrenal glands. There is extensive
bilateral renal cortical scarring and volume loss involving both
kidneys.

Bilateral kidney stones are identified. Most of these are in the
inferior pole of the left kidney measuring up to 7 mm. Small, 3 mm
stone is identified within the inferior pole of the right kidney. No
hydronephrosis, hydroureter or ureteral lithiasis identified.

Numerous bilateral kidney cysts are identified. The largest left
kidney cyst is in the mid and inferior pole measuring 6.2 cm, image
86/20. The largest right kidney cyst arises from the medial cortex
of the upper pole measuring 4.7 cm. Within the anterior cortex of
the left mid kidney there is a hyperdense lesion without definite
internal contrast enhancement measuring 1.2 cm, favor benign Bosniak
category 2 lesion cyst with peripheral calcification arising from
the posterolateral cortex of the left mid kidney is unchanged
measuring 1.4 cm, image 83/17. Peripherally calcified cyst arising
from lateral cortex of the right kidney is unchanged measuring 1 cm,
image 37/17. No new suspicious findings identified.

Stomach/Bowel: Stomach is within normal limits. Appendix appears
normal. No evidence of bowel wall thickening, distention, or
inflammatory changes.

Vascular/Lymphatic: Extensive aortic atherosclerosis. No aneurysm.
No abdominopelvic adenopathy identified.

Other: Ventral midline supraumbilical hernia is again noted
containing fat only, image 69/9.

Musculoskeletal: Spondylosis within the thoracolumbar spine
identified. No aggressive lytic or sclerotic bone lesions.
IMPRESSION: 1. No significant change compared with [DATE].
2. Multiple bilateral kidney stones without hydronephrosis.
3. Bilateral Bosniak category 1 and Bosniak category 2 kidney
lesions.
4. Bilateral renal cortical scarring and volume loss.
5. Ventral midline supraumbilical hernia containing fat only.

## 2019-07-28 MED ORDER — IOHEXOL 300 MG/ML  SOLN
100.0000 mL | Freq: Once | INTRAMUSCULAR | Status: AC | PRN
  Administered 2019-07-28: 13:00:00 100 mL via INTRAVENOUS

## 2019-08-02 ENCOUNTER — Telehealth: Payer: Self-pay | Admitting: Urology

## 2019-08-02 ENCOUNTER — Ambulatory Visit: Payer: Medicare Other | Admitting: Urology

## 2019-08-02 NOTE — Telephone Encounter (Signed)
Patient had a 1 year follow up with a CT prior scheduled for today.  He had the CT scan, but is not able to make it to his follow up appointment. He states that he is doing well with no problems.   He is requesting a phone call with results or a virtual visit.    Please advise on how Dr. Erlene Quan would like to proceed.

## 2019-08-02 NOTE — Telephone Encounter (Signed)
We can make today's visit virtual.  He was going to have a PSA drawn to ensure stability, he can have this drawn some other time or to his primary care's office.

## 2019-08-10 ENCOUNTER — Telehealth: Payer: Self-pay | Admitting: *Deleted

## 2019-08-10 NOTE — Telephone Encounter (Addendum)
Left VM to return call  ----- Message from Hollice Espy, MD sent at 08/10/2019 10:34 AM EDT ----- This patient was scheduled to see me last week which looks like it was canceled and not rescheduled.  Please call him and urge him to reschedule to review these results.  If they like to do a virtual visit, that would be fine to, perhaps Monday.   Hollice Espy, MD

## 2019-08-16 ENCOUNTER — Ambulatory Visit (INDEPENDENT_AMBULATORY_CARE_PROVIDER_SITE_OTHER): Payer: Medicare Other | Admitting: Urology

## 2019-08-16 ENCOUNTER — Encounter: Payer: Self-pay | Admitting: Urology

## 2019-08-16 ENCOUNTER — Other Ambulatory Visit: Payer: Self-pay

## 2019-08-16 VITALS — BP 140/84 | HR 79 | Ht 72.0 in | Wt 205.0 lb

## 2019-08-16 DIAGNOSIS — R972 Elevated prostate specific antigen [PSA]: Secondary | ICD-10-CM

## 2019-08-16 DIAGNOSIS — N200 Calculus of kidney: Secondary | ICD-10-CM

## 2019-08-16 DIAGNOSIS — N281 Cyst of kidney, acquired: Secondary | ICD-10-CM

## 2019-08-16 MED ORDER — FINASTERIDE 5 MG PO TABS: 5.0000 mg | ORAL_TABLET | Freq: Every day | ORAL | 11 refills | Status: DC

## 2019-08-16 NOTE — Progress Notes (Signed)
08/16/2019 3:27 PM   [name] [birth year] [id number]  Referring provider: Denton Lank, MD [address] [place],  [place] [address]  Chief Complaint  Patient presents with  . Follow-up    HPI: 78 year old male with multiple GU issues including complex renal cyst, hydrocele, BPH and elevated PSA who returns today for routine annual follow-up.  He was last seen in 09/2018.  Complex renal cyst was identified on hematuria evaluation in the form of CT scan.  He returns today with follow-up imaging.  The CT abdomen pelvis with and without contrast on 07/28/2019 shows bilateral kidney stones, relatively small.  He has bilateral Bosniak 1 and 2 cysts along with cortical scarring and volume loss.  No other GU pathology was identified.  Overall this is essentially stable and unchanged from scan from 05/2018.  He has a history of significant prostamegaly with 150 g prostate.  He previously was on finasteride and Flomax but has since stopped this medication.  He is not interested in surgery.  He is not bothered by his urinary symptoms and believes he is voiding slightly better than previously.  He has no significant urinary complaints.  No UTIs or hematuria.  His PSA last year was 5.8, likely appropriate for his gland size.  Plan was to repeat this year and if stable, discontinue further screening.  No recent leg pains or kidney stone episodes.   PMH: Past Medical History:  Diagnosis Date  . Chronic atrial fibrillation ([place])    a. Afib dates back to at least 2002 when reviewing prior EKG with EKGs from 2014 onward showing Afib; b. CHADS2VASc at least 4 (CHF, HTN, age x 2)  . Chronic diastolic CHF (congestive heart failure) ([place])    a. TTE 5/19: EF 70-75%, mod LVH, near complete obliteration of the mid and apical LV cavity (can be seen in apical hypertrophic CM), mildly dilated LA, RV cavity size nl, RV wall thickness nl, RVSF mildly reduced, mildly dilated RA; b. 02/2018 Limited  echo w/ definity: EF 50-65%, no apical hypertrophic CM.  Marland Kitchen Gout   . Hypertension     Surgical History: Past Surgical History:  Procedure Laterality Date  . CHOLECYSTECTOMY N/A 01/13/2017   Procedure: LAPAROSCOPIC CHOLECYSTECTOMY;  Surgeon: Jules Husbands, MD;  Location: [facility] ORS;  Service: General;  Laterality: N/A;    Home Medications:  Allergies as of 08/16/2019      Reactions   Cortisone Anaphylaxis      Medication List       Accurate as of August 16, 2019 11:59 PM. If you have any questions, ask your nurse or doctor.        aspirin 325 MG tablet Take 325 mg by mouth daily.   atorvastatin 40 MG tablet Commonly known as: LIPITOR Take 1 tablet by mouth daily.   felodipine 10 MG 24 hr tablet Commonly known as: PLENDIL Take 10 mg by mouth daily.   finasteride 5 MG tablet Commonly known as: PROSCAR Take 1 tablet (5 mg total) by mouth daily. Started by: Hollice Espy, MD   furosemide 40 MG tablet Commonly known as: LASIX Take 1 tablet (40 mg total) by mouth daily.   hydrALAZINE 25 MG tablet Commonly known as: APRESOLINE Take 1 tablet (25 mg total) by mouth 3 (three) times daily.   metoprolol tartrate 50 MG tablet Commonly known as: LOPRESSOR Take 1 tablet (50 mg total) by mouth 2 (two) times daily.   nystatin powder Commonly known as: MYCOSTATIN/NYSTOP Apply topically  2 (two) times daily.       Allergies:  Allergies  Allergen Reactions  . Cortisone Anaphylaxis    Family History: Family History  Problem Relation Age of Onset  . Stroke Mother     Social History:  reports that he has never smoked. He has never used smokeless tobacco. He reports that he does not drink alcohol or use drugs.  ROS: UROLOGY Frequent Urination?: Yes Hard to postpone urination?: No Burning/pain with urination?: No Get up at night to urinate?: No Leakage of urine?: No Urine stream starts and stops?: No Trouble starting stream?: Yes Do you have to strain to  urinate?: No Blood in urine?: No Urinary tract infection?: No Sexually transmitted disease?: No Injury to kidneys or bladder?: No Painful intercourse?: No Weak stream?: No Erection problems?: No Penile pain?: No  Gastrointestinal Nausea?: No Vomiting?: No Indigestion/heartburn?: No Diarrhea?: No Constipation?: No  Constitutional Fever: No Night sweats?: No Weight loss?: No Fatigue?: No  Skin Skin rash/lesions?: No Itching?: No  Eyes Blurred vision?: No Double vision?: No  Ears/Nose/Throat Sore throat?: No Sinus problems?: No  Hematologic/Lymphatic Swollen glands?: No Easy bruising?: No  Cardiovascular Leg swelling?: No Chest pain?: No  Respiratory Cough?: No Shortness of breath?: No  Endocrine Excessive thirst?: No  Musculoskeletal Back pain?: No Joint pain?: No  Neurological Headaches?: No Dizziness?: No  Psychologic Depression?: No Anxiety?: No  Physical Exam: BP 140/84   Pulse 79   Ht 6' (1.829 m)   Wt 205 lb (93 kg)   BMI 27.80 kg/m   Constitutional:  Alert and oriented, No acute distress.  In a wheelchair, pleasant and interactive. HEENT: [place] AT, moist mucus membranes.  Trachea midline, no masses. Cardiovascular: No clubbing, cyanosis, or edema. Respiratory: Normal respiratory effort, no increased work of breathing. Skin: No rashes, bruises or suspicious lesions. Neurologic: Grossly intact, no focal deficits, moving all 4 extremities. Psychiatric: Normal mood and affect.  Laboratory Data: Lab Results  Component Value Date   WBC 7.8 08/15/2018   HGB 16.4 08/15/2018   HCT 49.8 08/15/2018   MCV 85.1 08/15/2018   PLT 183 08/15/2018    Lab Results  Component Value Date   CREATININE 1.70 (H) 07/28/2019    Lab Results  Component Value Date   HGBA1C 6.6 (H) 04/21/2018    Pertinent Imaging: CLINICAL DATA:  Followup hematuria  EXAM: CT ABDOMEN WITHOUT AND WITH CONTRAST  TECHNIQUE: Multidetector CT imaging of the  abdomen was performed following the standard protocol before and following the bolus administration of intravenous contrast.  CONTRAST:  166mL OMNIPAQUE IOHEXOL 300 MG/ML  SOLN  COMPARISON:  06/22/2018  FINDINGS: Lower chest: No acute abnormality.  Hepatobiliary: No focal liver abnormality. Cholecystectomy. No biliary ductal dilatation.  Pancreas: Unremarkable. No pancreatic ductal dilatation or surrounding inflammatory changes.  Spleen: Normal in size without focal abnormality.  Adrenals/Urinary Tract: Normal adrenal glands. There is extensive bilateral renal cortical scarring and volume loss involving both kidneys.  Bilateral kidney stones are identified. Most of these are in the inferior pole of the left kidney measuring up to 7 mm. Small, 3 mm stone is identified within the inferior pole of the right kidney. No hydronephrosis, hydroureter or ureteral lithiasis identified.  Numerous bilateral kidney cysts are identified. The largest left kidney cyst is in the mid and inferior pole measuring 6.2 cm, image 86/20. The largest right kidney cyst arises from the medial cortex of the upper pole measuring 4.7 cm. Within the anterior cortex of the left mid kidney  there is a hyperdense lesion without definite internal contrast enhancement measuring 1.2 cm, favor benign Bosniak category 2 lesion cyst with peripheral calcification arising from the posterolateral cortex of the left mid kidney is unchanged measuring 1.4 cm, image 83/17. Peripherally calcified cyst arising from lateral cortex of the right kidney is unchanged measuring 1 cm, image 37/17. No new suspicious findings identified.  Stomach/Bowel: Stomach is within normal limits. Appendix appears normal. No evidence of bowel wall thickening, distention, or inflammatory changes.  Vascular/Lymphatic: Extensive aortic atherosclerosis. No aneurysm. No abdominopelvic adenopathy identified.  Other: Ventral midline  supraumbilical hernia is again noted containing fat only, image 69/9.  Musculoskeletal: Spondylosis within the thoracolumbar spine identified. No aggressive lytic or sclerotic bone lesions.  IMPRESSION: 1. No significant change compared with 06/22/2018. 2. Multiple bilateral kidney stones without hydronephrosis. 3. Bilateral Bosniak category 1 and Bosniak category 2 kidney lesions. 4. Bilateral renal cortical scarring and volume loss. 5. Ventral midline supraumbilical hernia containing fat only.   Electronically Signed   By: Kerby Moors M.D.   On: 07/28/2019 14:17  CT scan was personally reviewed today.  Agree physiologic interpretation, essentially stable from 2019 which is reassuring.  Assessment & Plan:    1. Elevated PSA/ BPH Repeat PSA today, if stable would recommend no further PSA testing given his age and comorbidities based on screening guidelines.  Tested today essentially for stability.  Asymptomatic despite severe prostamegaly, will follow clinically.  On no meds, not interested in surgery. - PSA  2. Renal cyst Bosniak 1 and 2 renal cyst, stable from last year which is very reassuring.  Given the peripheral calcifications and some somewhat concerning features, will continue to follow these intermittently with renal ultrasound, he is agreeable this plan.  Plan for renal ultrasound in a year, if stable then may consider no further radiologic evaluation/surveillance. - US RENAL; Future  3. Kidney stones Asymptomatic, can follow with serial imaging as well - US RENAL; Future   Return in about 1 year (around 08/15/2020) for PA for RUS, IPSS, PVR.  Hollice Espy, MD  [facility] [address], [place], [place] [address] [phone]

## 2019-08-17 ENCOUNTER — Telehealth: Payer: Self-pay

## 2019-08-17 LAB — PSA: Prostate Specific Ag, Serum: 5.5 ng/mL — ABNORMAL HIGH (ref 0.0–4.0)

## 2019-08-17 NOTE — Telephone Encounter (Signed)
-----   Message from Hollice Espy, MD sent at 08/17/2019  9:03 AM EDT ----- PSA is stable from last year, great news.  I would strongly recommend no further PSA testing in the future.  Hollice Espy, MD

## 2019-08-17 NOTE — Telephone Encounter (Signed)
Patient notified

## 2019-08-23 NOTE — Telephone Encounter (Signed)
Pt called and just wanted to let us know that "we are doing a great job here" he also states that he is feeling better since taking all meds that were prescribed.

## 2019-09-26 ENCOUNTER — Other Ambulatory Visit: Payer: Self-pay | Admitting: Urology

## 2019-10-25 ENCOUNTER — Other Ambulatory Visit: Payer: Self-pay | Admitting: Family

## 2019-11-17 ENCOUNTER — Other Ambulatory Visit: Payer: Self-pay | Admitting: Urology

## 2019-11-20 NOTE — Telephone Encounter (Signed)
Pt called and requests a refill for Nystatin powder.

## 2019-11-24 ENCOUNTER — Telehealth: Payer: Self-pay | Admitting: Urology

## 2019-11-24 NOTE — Telephone Encounter (Signed)
Pt asking for Nystatin powder refill. Please advise.   Thanks

## 2019-11-27 NOTE — Telephone Encounter (Signed)
He has not been seen for his rash since 2019.  He will need an office visit.

## 2019-12-07 ENCOUNTER — Ambulatory Visit: Payer: Medicare Other | Admitting: Urology

## 2019-12-07 ENCOUNTER — Encounter: Payer: Self-pay | Admitting: Urology

## 2020-04-15 ENCOUNTER — Other Ambulatory Visit: Payer: Self-pay | Admitting: Student

## 2020-04-15 DIAGNOSIS — R2990 Unspecified symptoms and signs involving the nervous system: Secondary | ICD-10-CM

## 2020-04-19 ENCOUNTER — Other Ambulatory Visit: Payer: Self-pay

## 2020-04-19 ENCOUNTER — Ambulatory Visit
Admission: RE | Admit: 2020-04-19 | Discharge: 2020-04-19 | Disposition: A | Discharge status: Home / Self Care | Payer: Medicare Other | Source: Ambulatory Visit | Attending: Student | Admitting: Student

## 2020-04-19 DIAGNOSIS — R2990 Unspecified symptoms and signs involving the nervous system: Secondary | ICD-10-CM

## 2020-04-19 DIAGNOSIS — G319 Degenerative disease of nervous system, unspecified: Secondary | ICD-10-CM | POA: Diagnosis not present

## 2020-04-19 IMAGING — CT CT HEAD W/O CM
4 of 5 series · 16 of 47 positions shown, 18 images · non-contrast
Comparison: [DATE]

CLINICAL DATA: Left leg weakness for 1 month, initial encounter

EXAM:
CT HEAD WITHOUT CONTRAST
TECHNIQUE: Contiguous axial images were obtained from the base of the skull
through the vertex without intravenous contrast.

[Series 2: head wo · axial · 0.42mm/px · z∈[+328,+458]mm · 8 of 34 slices shown, 10 images]
[im 4/34  brain]
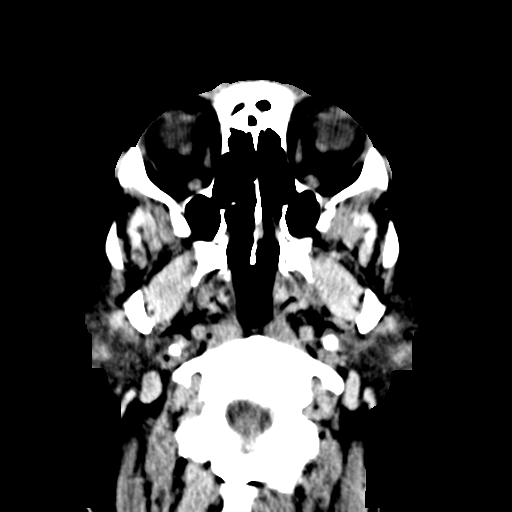
[im 4/34  bone]
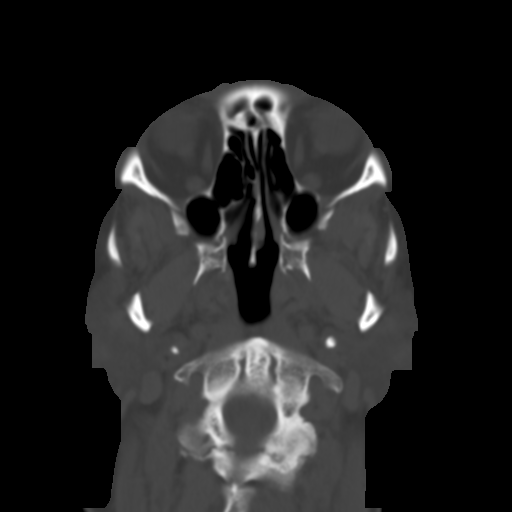
[im 8/34  brain]
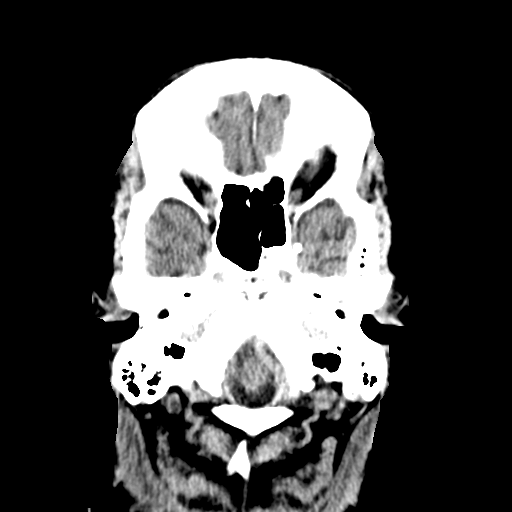
[im 12/34  brain]
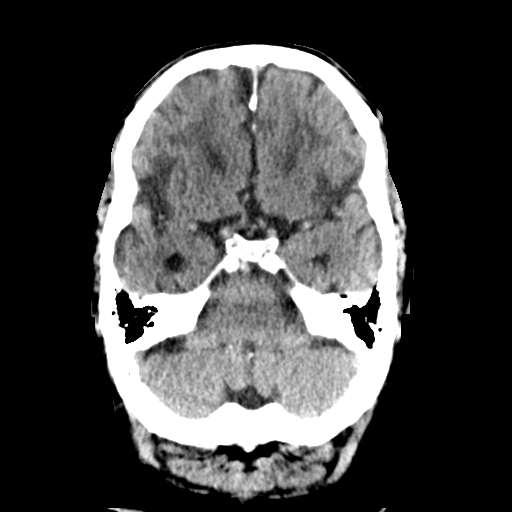
[im 15/34  brain]
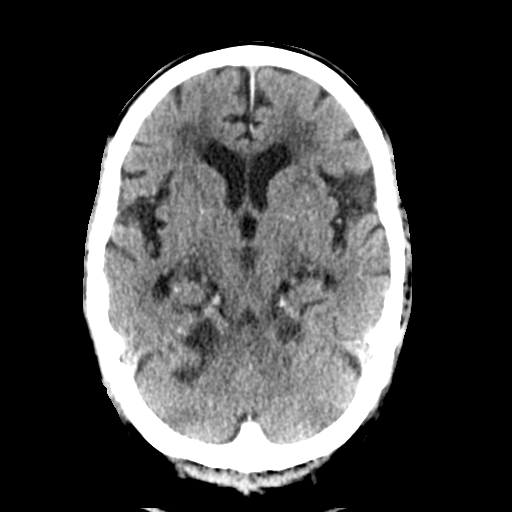
[im 19/34  brain]
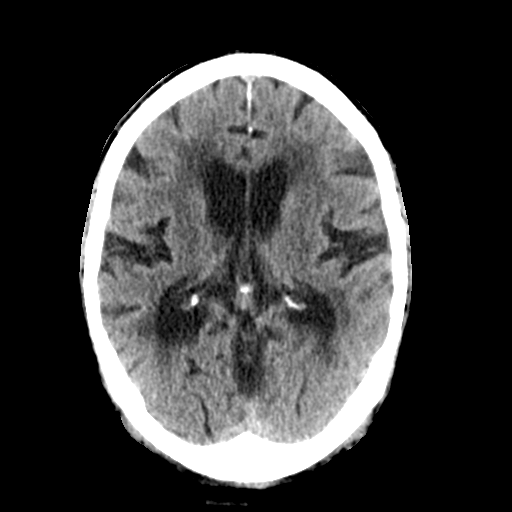
[im 19/34  bone]
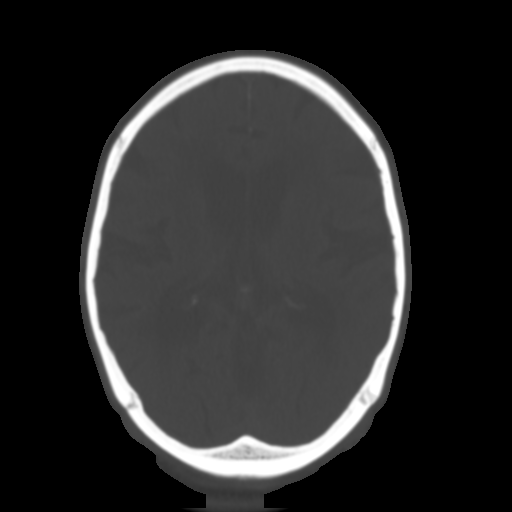
[im 23/34  brain]
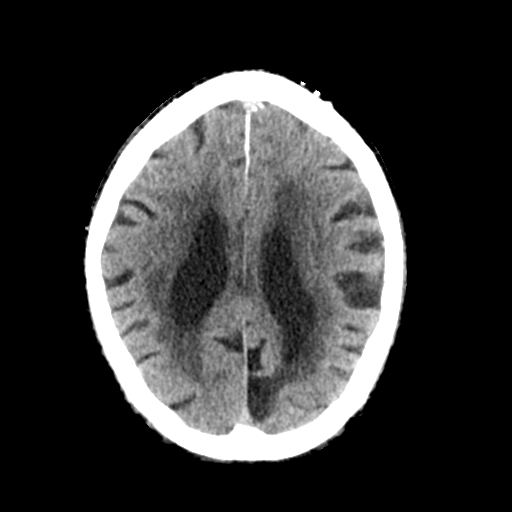
[im 26/34  brain]
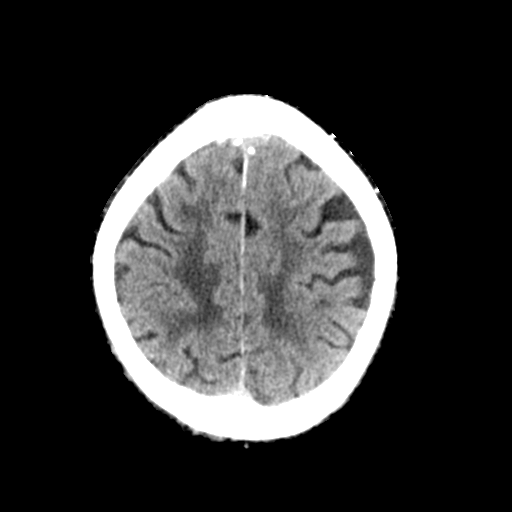
[im 30/34  brain]
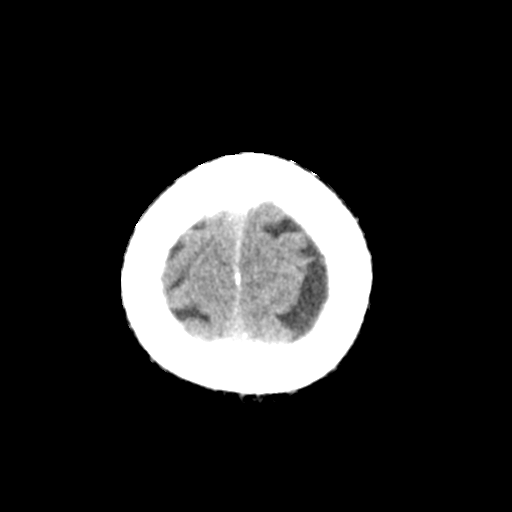

[Series 4: coronal soft tissue · coronal · 0.29mm/px · 3 of 68 slices shown]
[im 23/68  brain]
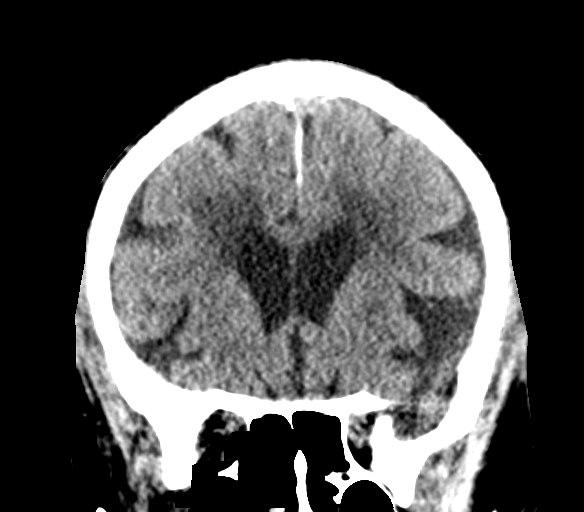
[im 30/68  brain]
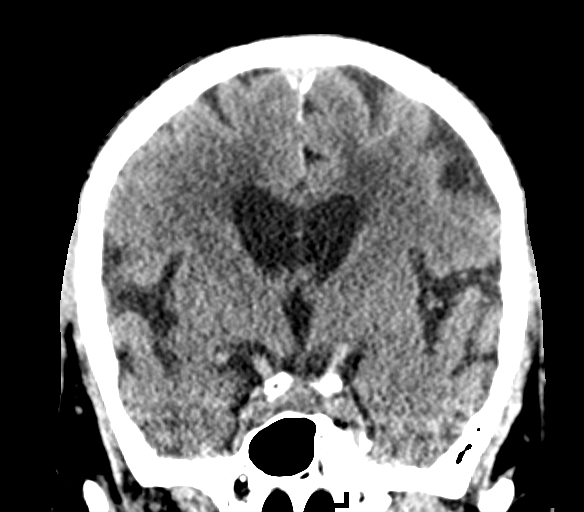
[im 38/68  brain]
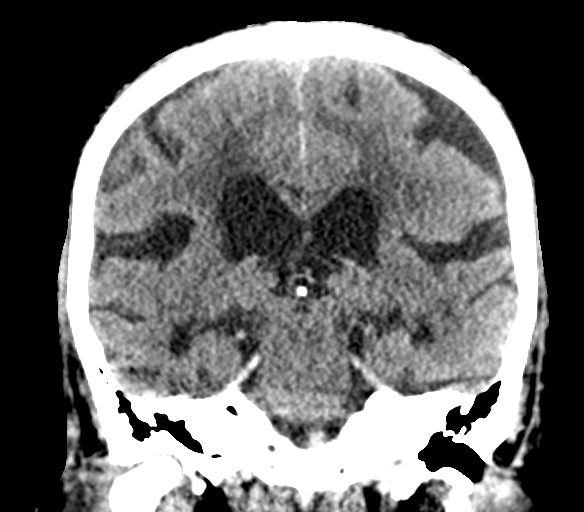

[Series 5: sagittal soft tissue · sagittal · 0.29mm/px · 3 of 56 slices shown]
[im 19/56  brain]
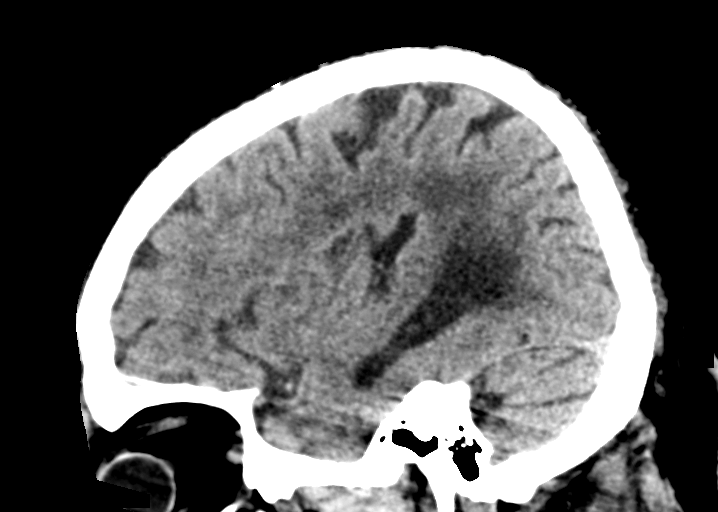
[im 28/56  brain]
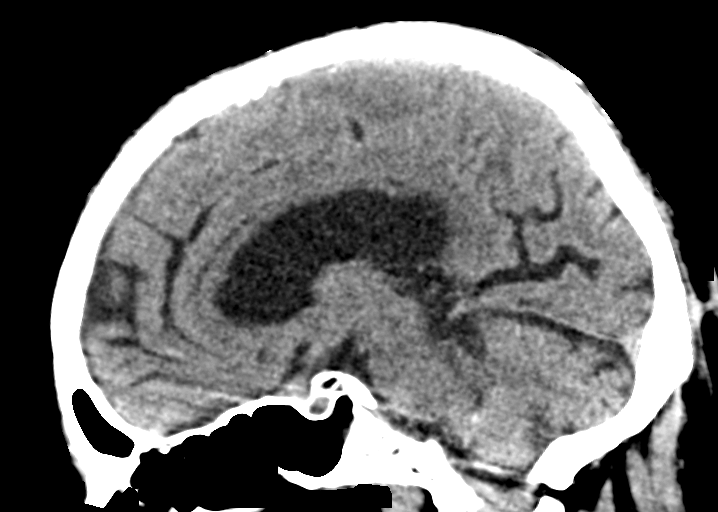
[im 37/56  brain]
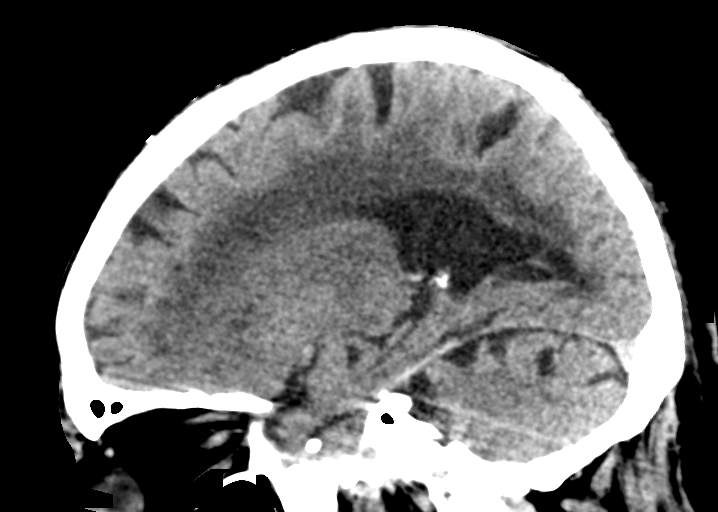

[Series 6: ax head wo · axial · 0.34mm/px · z∈[+296,+315]mm · 2 of 37 slices shown]
[im 5/37  brain]
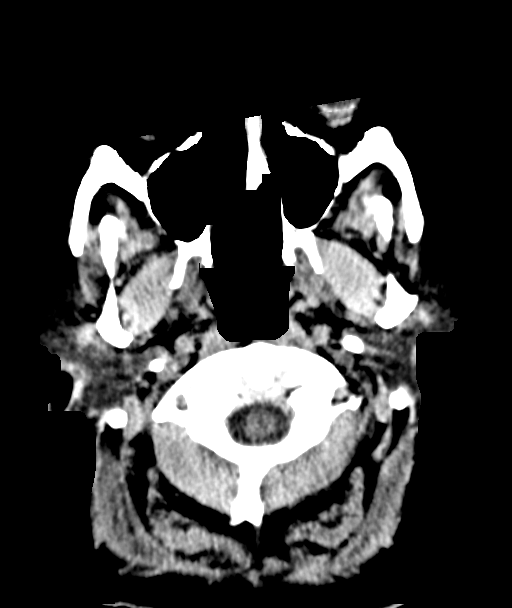
[im 9/37  brain]
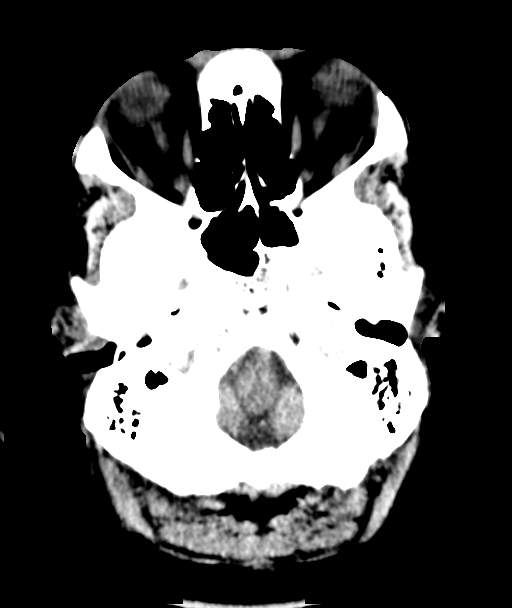

[16 of 47 positions shown; findings below may reference images not displayed]

FINDINGS: Brain: Progressive atrophy and chronic white matter ischemic change
is noted. No findings to suggest acute hemorrhage, acute infarction
or space-occupying mass lesion are noted.

Vascular: No hyperdense vessel or unexpected calcification.

Skull: Normal. Negative for fracture or focal lesion.

Sinuses/Orbits: No acute finding.

Other: None.
IMPRESSION: Progressive atrophic and chronic white matter ischemic changes when
compare with the prior exam. No acute abnormality noted.

## 2020-08-14 NOTE — Progress Notes (Deleted)
08/15/2020 2:38 PM   George Bolton 1940-12-12 [id number]  Referring provider: Denton Lank, MD [address] [place],  [place] [address]  No chief complaint on file.   HPI: 79 year old male with multiple GU issues including complex renal cyst, hydrocele, BPH and elevated PSA who returns today for routine annual follow-up.   Complex renal cyst was identified on hematuria evaluation in the form of CT scan.  The CT abdomen pelvis with and without contrast on 07/28/2019 shows bilateral kidney stones, relatively small.  He has bilateral Bosniak 1 and 2 cysts along with cortical scarring and volume loss.  No other GU pathology was identified.  Overall this is essentially stable and unchanged from scan from 05/2018.  RUS pending***  He has a history of significant prostamegaly with 150 g prostate.  He previously was on finasteride and Flomax but has since stopped this medication.  He is not interested in surgery.  He is not bothered by his urinary symptoms and believes he is voiding slightly better than previously.  He has no significant urinary complaints.  No UTIs or hematuria.  His PSA last year was 5.5, likely appropriate for his gland size.  Further screening has been discontinued.    No recent leg pains or kidney stone episodes.   PMH: Past Medical History:  Diagnosis Date  . Chronic atrial fibrillation ([place])    a. Afib dates back to at least 2002 when reviewing prior EKG with EKGs from 2014 onward showing Afib; b. CHADS2VASc at least 4 (CHF, HTN, age x 2)  . Chronic diastolic CHF (congestive heart failure) ([place])    a. TTE 5/19: EF 70-75%, mod LVH, near complete obliteration of the mid and apical LV cavity (can be seen in apical hypertrophic CM), mildly dilated LA, RV cavity size nl, RV wall thickness nl, RVSF mildly reduced, mildly dilated RA; b. 02/2018 Limited echo w/ definity: EF 50-65%, no apical hypertrophic CM.  Marland Kitchen Gout   . Hypertension     Surgical History: Past  Surgical History:  Procedure Laterality Date  . CHOLECYSTECTOMY N/A 01/13/2017   Procedure: LAPAROSCOPIC CHOLECYSTECTOMY;  Surgeon: Jules Husbands, MD;  Location: [facility] ORS;  Service: General;  Laterality: N/A;    Home Medications:  Allergies as of 08/15/2020      Reactions   Cortisone Anaphylaxis      Medication List       Accurate as of August 14, 2020  2:38 PM. If you have any questions, ask your nurse or doctor.        aspirin 325 MG tablet Take 325 mg by mouth daily.   atorvastatin 40 MG tablet Commonly known as: LIPITOR Take 1 tablet by mouth daily.   felodipine 10 MG 24 hr tablet Commonly known as: PLENDIL Take 10 mg by mouth daily.   finasteride 5 MG tablet Commonly known as: PROSCAR Take 1 tablet (5 mg total) by mouth daily.   furosemide 40 MG tablet Commonly known as: LASIX Take 1 tablet (40 mg total) by mouth daily.   hydrALAZINE 25 MG tablet Commonly known as: APRESOLINE Take 1 tablet (25 mg total) by mouth 3 (three) times daily.   metoprolol tartrate 50 MG tablet Commonly known as: LOPRESSOR Take 1 tablet (50 mg total) by mouth 2 (two) times daily.   nystatin powder Generic drug: nystatin APPLY  POWDER TOPICALLY TWICE DAILY       Allergies:  Allergies  Allergen Reactions  . Cortisone Anaphylaxis    Family History:  Family History  Problem Relation Age of Onset  . Stroke Mother     Social History:  reports that he has never smoked. He has never used smokeless tobacco. He reports that he does not drink alcohol and does not use drugs.  ROS: For pertinent review of systems please refer to history of present illness  Physical Exam: There were no vitals taken for this visit.  Constitutional:  Well nourished. Alert and oriented, No acute distress. HEENT: [place] AT, moist mucus membranes.  Trachea midline Cardiovascular: No clubbing, cyanosis, or edema. Respiratory: Normal respiratory effort, no increased work of breathing. GI: Abdomen is  soft, non tender, non distended, no abdominal masses. Liver and spleen not palpable.  No hernias appreciated.  Stool sample for occult testing is not indicated.   GU: No CVA tenderness.  No bladder fullness or masses.  Patient with circumcised/uncircumcised phallus. ***Foreskin easily retracted***  Urethral meatus is patent.  No penile discharge. No penile lesions or rashes. Scrotum without lesions, cysts, rashes and/or edema.  Testicles are located scrotally bilaterally. No masses are appreciated in the testicles. Left and right epididymis are normal. Rectal: Patient with  normal sphincter tone. Anus and perineum without scarring or rashes. No rectal masses are appreciated. Prostate is approximately *** grams, *** nodules are appreciated. Seminal vesicles are normal. Skin: No rashes, bruises or suspicious lesions. Lymph: No inguinal adenopathy. Neurologic: Grossly intact, no focal deficits, moving all 4 extremities. Psychiatric: Normal mood and affect.  Laboratory Data: Lab Results  Component Value Date   WBC 7.8 08/15/2018   HGB 16.4 08/15/2018   HCT 49.8 08/15/2018   MCV 85.1 08/15/2018   PLT 183 08/15/2018    Lab Results  Component Value Date   CREATININE 1.70 (H) 07/28/2019    Lab Results  Component Value Date   HGBA1C 6.6 (H) 04/21/2018    Pertinent Imaging: ***  Assessment & Plan:    1. Elevated PSA/ BPH Repeat PSA today, if stable would recommend no further PSA testing given his age and comorbidities based on screening guidelines.  Tested today essentially for stability.  Asymptomatic despite severe prostamegaly, will follow clinically.  On no meds, not interested in surgery. - PSA  2. Renal cyst Bosniak 1 and 2 renal cyst, stable from last year which is very reassuring.  Given the peripheral calcifications and some somewhat concerning features, will continue to follow these intermittently with renal ultrasound, he is agreeable this plan.  Plan for renal ultrasound  in a year, if stable then may consider no further radiologic evaluation/surveillance. - US RENAL; Future  3. Kidney stones Asymptomatic, can follow with serial imaging as well - US RENAL; Future   No follow-ups on file.  Zara Council, PA-C  [facility] [address], [place], [place] [address] [phone]

## 2020-08-15 ENCOUNTER — Ambulatory Visit: Payer: Medicare Other | Admitting: Urology

## 2020-08-15 DIAGNOSIS — N200 Calculus of kidney: Secondary | ICD-10-CM

## 2020-08-15 DIAGNOSIS — N401 Enlarged prostate with lower urinary tract symptoms: Secondary | ICD-10-CM

## 2020-08-15 DIAGNOSIS — N281 Cyst of kidney, acquired: Secondary | ICD-10-CM

## 2020-08-20 NOTE — Progress Notes (Signed)
Patient rescheduled

## 2020-08-21 ENCOUNTER — Ambulatory Visit (INDEPENDENT_AMBULATORY_CARE_PROVIDER_SITE_OTHER): Payer: Medicare Other | Admitting: Urology

## 2020-08-21 ENCOUNTER — Encounter: Payer: Self-pay | Admitting: Urology

## 2020-08-21 ENCOUNTER — Other Ambulatory Visit: Payer: Self-pay

## 2020-08-21 VITALS — BP 138/81 | HR 84

## 2020-08-21 DIAGNOSIS — N401 Enlarged prostate with lower urinary tract symptoms: Secondary | ICD-10-CM

## 2020-08-21 DIAGNOSIS — N200 Calculus of kidney: Secondary | ICD-10-CM

## 2020-08-21 DIAGNOSIS — N138 Other obstructive and reflux uropathy: Secondary | ICD-10-CM

## 2020-08-21 DIAGNOSIS — N281 Cyst of kidney, acquired: Secondary | ICD-10-CM | POA: Diagnosis not present

## 2020-08-21 LAB — BLADDER SCAN AMB NON-IMAGING: Scan Result: 19

## 2020-08-21 MED ORDER — FINASTERIDE 5 MG PO TABS: 5.0000 mg | ORAL_TABLET | Freq: Every day | ORAL | 11 refills | Status: DC

## 2020-08-21 MED ORDER — TAMSULOSIN HCL 0.4 MG PO CAPS: 0.4000 mg | ORAL_CAPSULE | Freq: Every day | ORAL | 11 refills | Status: DC

## 2020-08-28 ENCOUNTER — Other Ambulatory Visit: Payer: Self-pay

## 2020-08-28 ENCOUNTER — Ambulatory Visit
Admission: RE | Admit: 2020-08-28 | Discharge: 2020-08-28 | Disposition: A | Discharge status: Home / Self Care | Payer: Medicare Other | Source: Ambulatory Visit | Attending: Urology | Admitting: Urology

## 2020-08-28 DIAGNOSIS — N281 Cyst of kidney, acquired: Secondary | ICD-10-CM | POA: Diagnosis present

## 2020-08-28 IMAGING — US US RENAL
1 series · 15 of 25 positions shown · non-contrast
Comparison: [DATE].

CLINICAL DATA: Renal cysts.

EXAM:
RENAL / URINARY TRACT ULTRASOUND COMPLETE

[Series 1: us renal · 15 of 56 slices shown]
[im 1/56]
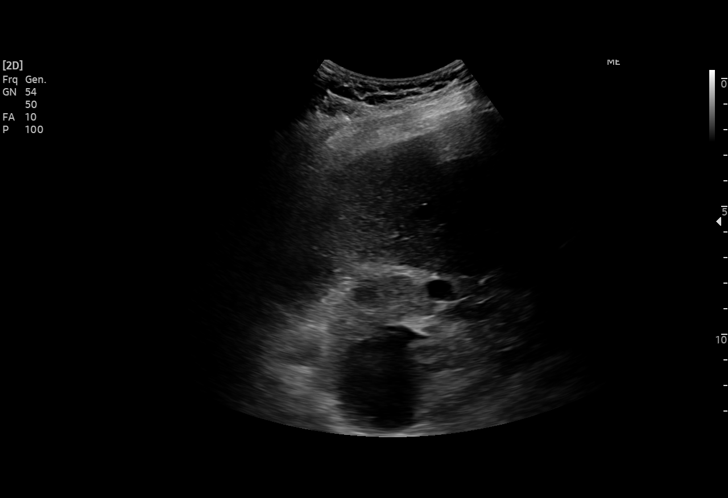
[im 5/56]
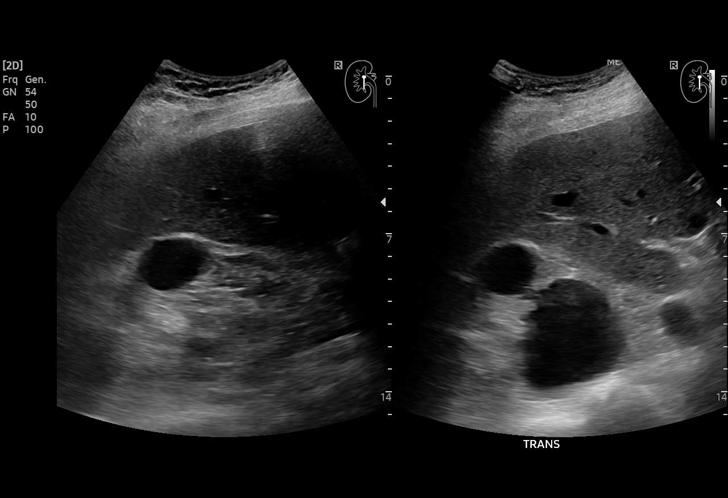
[im 10/56]
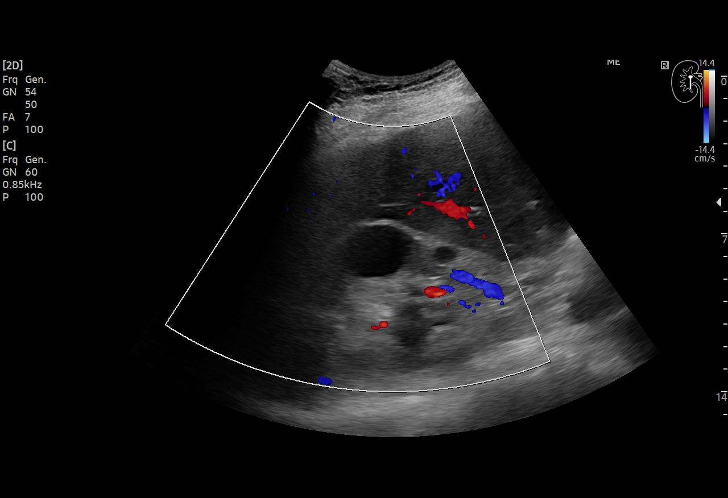
[im 12/56]
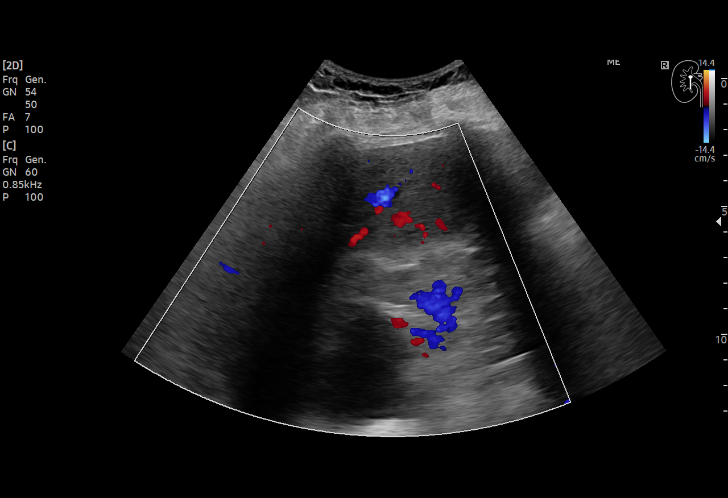
[im 17/56]
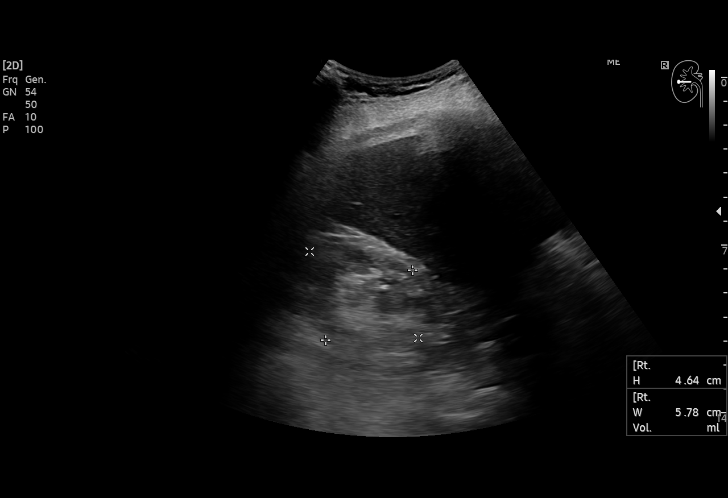
[im 21/56]
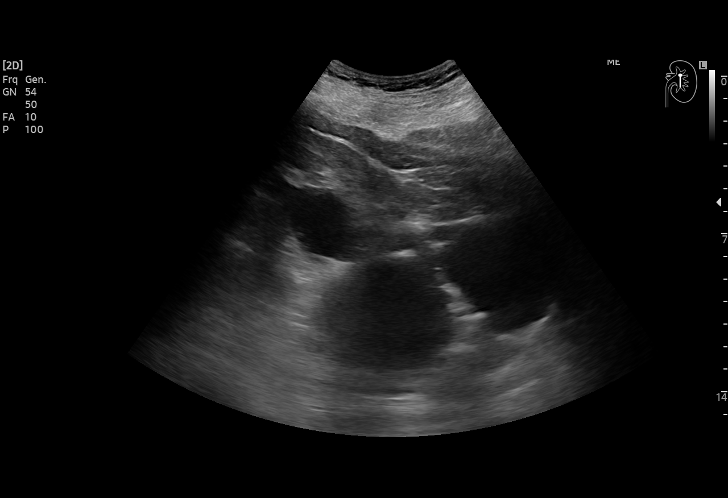
[im 23/56]
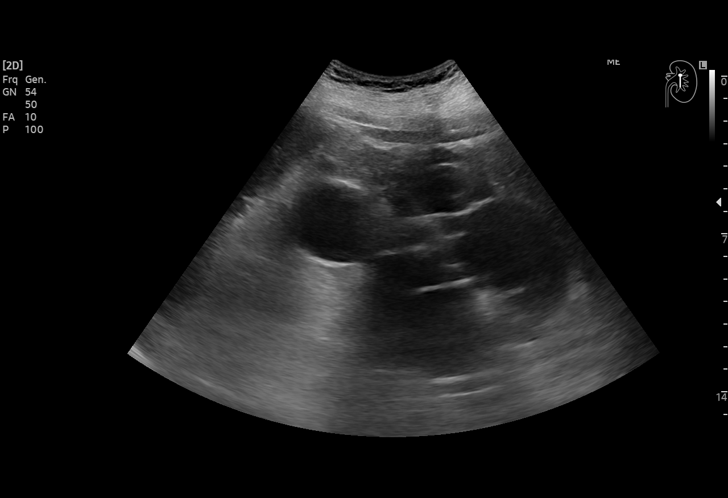
[im 28/56]
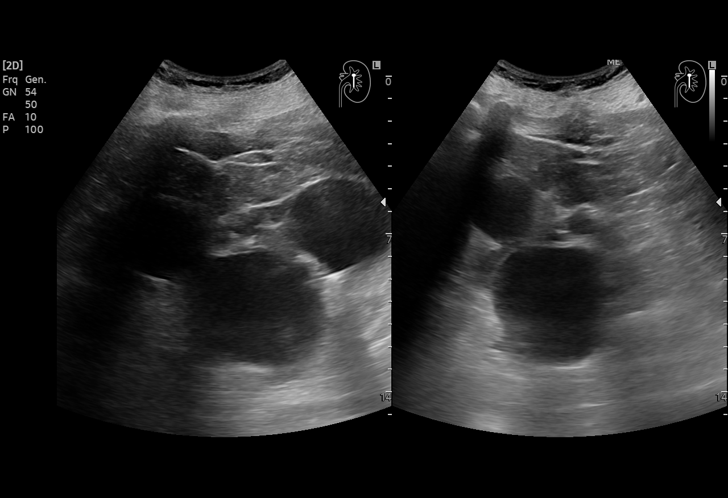
[im 33/56]
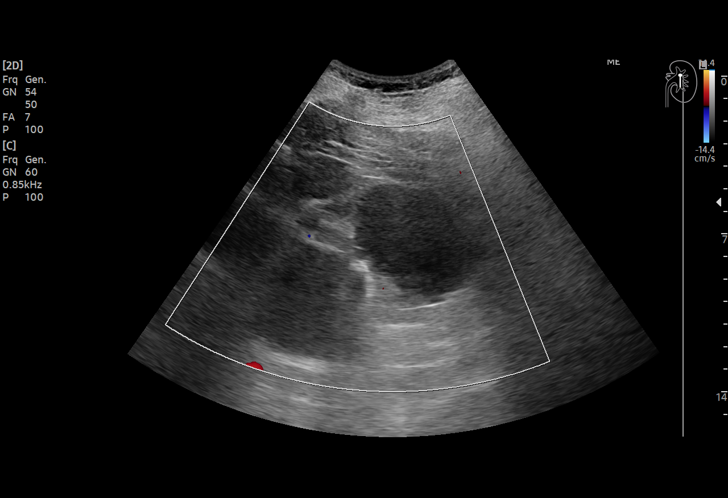
[im 35/56]
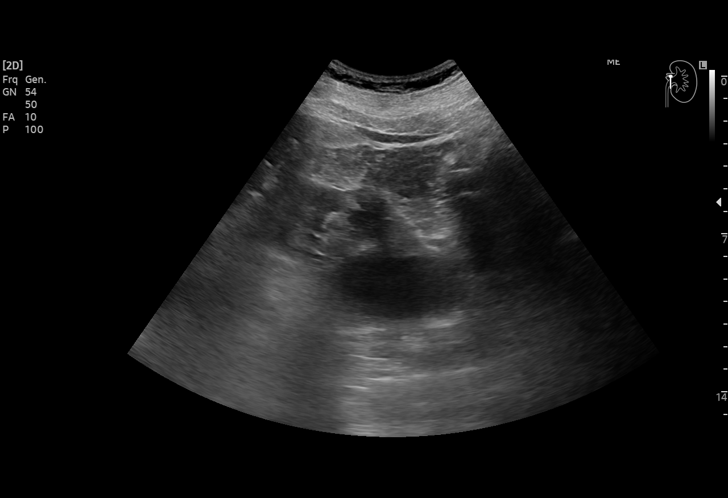
[im 39/56]
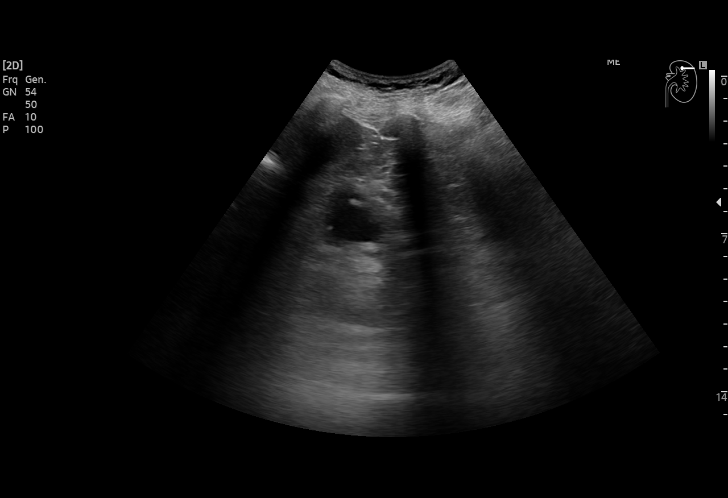
[im 44/56]
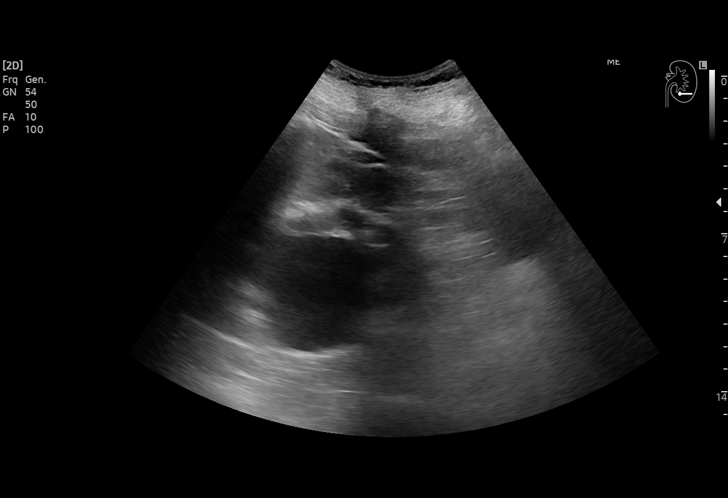
[im 46/56]
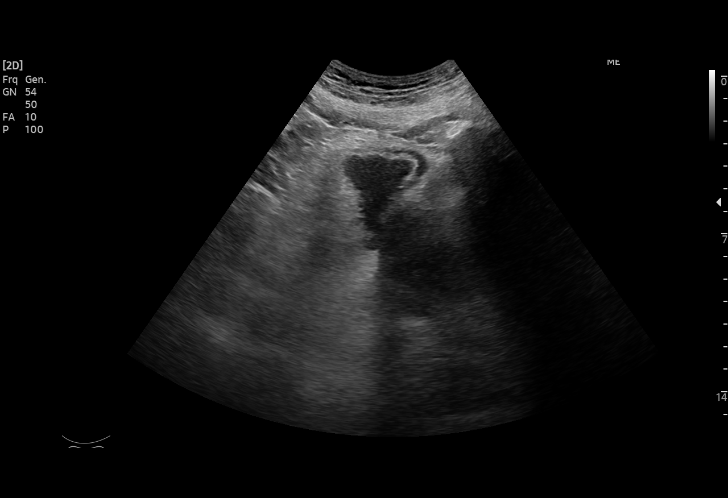
[im 51/56]
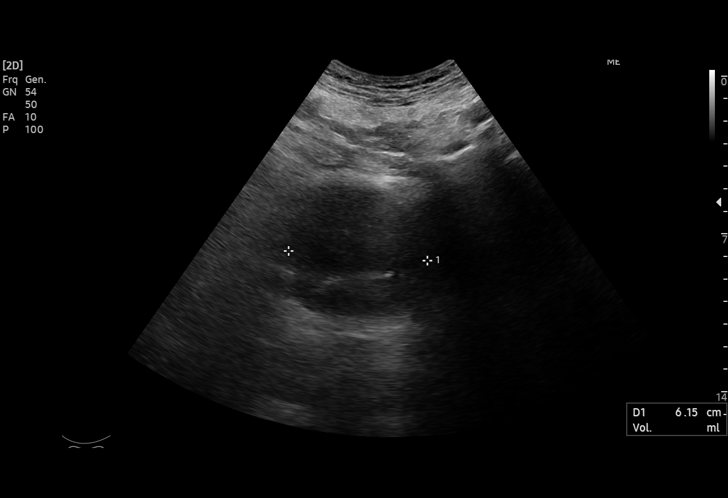
[im 56/56]
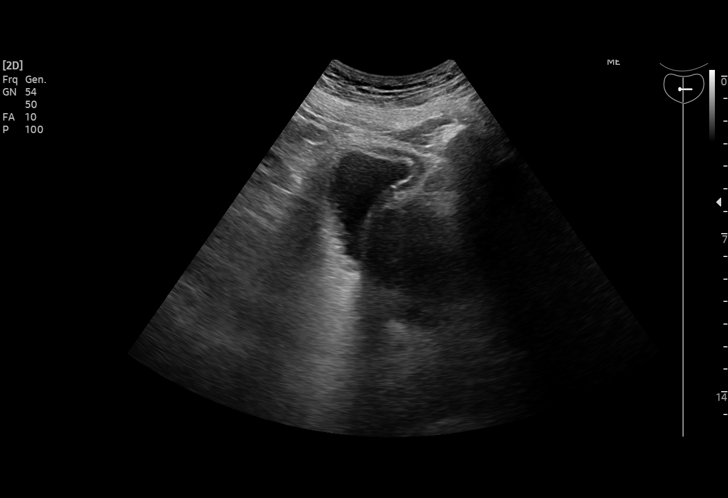

[15 of 25 positions shown; findings below may reference images not displayed]

FINDINGS: Right Kidney:

Renal measurements: 10.5 x 5.8 x 4.6 cm = volume: 147 mL. Increased
echogenicity is noted consistent with medical renal disease. Greater
than 10 cysts are noted. The largest is a simple cyst measuring
cm in the upper pole which is enlarged compared to prior exam.
Another is a simple cyst measuring 2.9 cm in upper pole. A third
simple cyst is noted in the upper pole measuring 1.7 cm. No mass or
hydronephrosis visualized.

Left Kidney:

Renal measurements: 14.1 x 7.2 x 6.1 cm = volume: 328 mL. Increased
echogenicity of renal parenchyma is noted suggesting medical renal
disease. Greater than 10 cysts are noted. The largest measures
cm in the midpole which is not significantly changed compared to
prior exam. 2.8 cm simple cyst is noted in upper pole. 4.2 cm simple
cyst is seen in lower pole. No mass or hydronephrosis visualized.

Bladder:

Appears normal for degree of bladder distention.

Other:

Mild to moderate prostatic enlargement is noted.
IMPRESSION: Increased echogenicity of renal parenchyma is noted bilaterally
consistent with medical renal disease. Bilateral simple cysts are
noted bilaterally. Most of these are unchanged compared to prior
exam, although there is a 5.1 cm simple cyst in the upper pole of
the right kidney which is slightly enlarged compared to prior exam.
No hydronephrosis or renal obstruction is noted.

## 2020-09-23 NOTE — Progress Notes (Signed)
09/24/2020 2:59 PM   George Bolton 1941-07-14 [id number]  Referring provider: Denton Lank, MD [address] [place],  [place] [address]  Chief Complaint  Patient presents with  . Benign Prostatic Hypertrophy    HPI: 79 year old male with multiple GU issues including complex renal cyst, hydrocele, BPH and elevated PSA who returns today for routine annual follow-up with his son, George Bolton.  Complex renal cyst was identified on hematuria evaluation in the form of CT scan.  The CT abdomen pelvis with and without contrast on 07/28/2019 shows bilateral kidney stones, relatively small.  He has bilateral Bosniak 1 and 2 cysts along with cortical scarring and volume loss.  No other GU pathology was identified.  Overall this is essentially stable and unchanged from scan from 05/2018.  RUS 08/28/2020 with multiple cysts.  Some with a linear septation in the left and an increase in size of the right upper pole cyst from 4.7 cm to 5.1 cm  BPH WITH LUTS  (prostate and/or bladder) IPSS score: 8/5   PVR: 11 mL  Previous score: 8/5   Previous PVR: 19 mL  Major complaint(s): He is having a weak urinary stream.  He states he is not satisfied with his urinary symptoms at this time as he has gout and is difficult to hold his penis to urinate.  Patient denies any modifying or aggravating factors.  Patient denies any gross hematuria, dysuria or suprapubic/flank pain.  Patient denies any fevers, chills, nausea or vomiting.   Currently taking: He is not taking the tamsulosin and finasteride   He has a history of significant prostamegaly with 150 g prostate.    IPSS    Row Name 09/24/20 1400         International Prostate Symptom Score   How often have you had the sensation of not emptying your bladder? About half the time     How often have you had to urinate less than every two hours? Not at All     How often have you found you stopped and started again several times when you urinated? About  half the time     How often have you found it difficult to postpone urination? About half the time     How often have you had a weak urinary stream? About half the time     How often have you had to strain to start urination? Less than half the time     How many times did you typically get up at night to urinate? 3 Times     Total IPSS Score 17       Quality of Life due to urinary symptoms   If you were to spend the rest of your life with your urinary condition just the way it is now how would you feel about that? Mixed            Score:  1-7 Mild 8-19 Moderate 20-35 Severe  His son is also noted that he has noticed an increase in the size of his scrotum and states it is covering his penis.  George Bolton also states that his scrotum is heavy and it is sometimes it is uncomfortable for him to walk.  PMH: Past Medical History:  Diagnosis Date  . Chronic atrial fibrillation ([place])    a. Afib dates back to at least 2002 when reviewing prior EKG with EKGs from 2014 onward showing Afib; b. CHADS2VASc at least 4 (CHF, HTN, age x 2)  .  Chronic diastolic CHF (congestive heart failure) ([place])    a. TTE 5/19: EF 70-75%, mod LVH, near complete obliteration of the mid and apical LV cavity (can be seen in apical hypertrophic CM), mildly dilated LA, RV cavity size nl, RV wall thickness nl, RVSF mildly reduced, mildly dilated RA; b. 02/2018 Limited echo w/ definity: EF 50-65%, no apical hypertrophic CM.  Marland Kitchen Gout   . Hypertension     Surgical History: Past Surgical History:  Procedure Laterality Date  . CHOLECYSTECTOMY N/A 01/13/2017   Procedure: LAPAROSCOPIC CHOLECYSTECTOMY;  Surgeon: Jules Husbands, MD;  Location: [facility] ORS;  Service: General;  Laterality: N/A;    Home Medications:  Allergies as of 09/24/2020      Reactions   Cortisone Anaphylaxis      Medication List       Accurate as of September 24, 2020  2:59 PM. If you have any questions, ask your nurse or doctor.        apixaban 2.5  MG Tabs tablet Commonly known as: ELIQUIS Take by mouth.   aspirin 325 MG tablet Take 325 mg by mouth daily.   atorvastatin 40 MG tablet Commonly known as: LIPITOR Take 1 tablet by mouth daily.   cefadroxil 500 MG capsule Commonly known as: DURICEF Take 500 mg by mouth 2 (two) times daily.   cloNIDine 0.1 MG tablet Commonly known as: CATAPRES Take 1 tablet by mouth 2 (two) times daily.   colchicine 0.6 MG tablet Take by mouth.   felodipine 10 MG 24 hr tablet Commonly known as: PLENDIL Take 10 mg by mouth daily.   finasteride 5 MG tablet Commonly known as: PROSCAR Take 1 tablet (5 mg total) by mouth daily.   furosemide 40 MG tablet Commonly known as: LASIX Take 1 tablet (40 mg total) by mouth daily.   hydrALAZINE 25 MG tablet Commonly known as: APRESOLINE Take 1 tablet (25 mg total) by mouth 3 (three) times daily.   HYDROcodone-acetaminophen 5-325 MG tablet Commonly known as: NORCO/VICODIN Take one tablet at night for pain; may take up to every 6 hours as needed for pain if not working or driving   isosorbide mononitrate 30 MG 24 hr tablet Commonly known as: IMDUR Take by mouth.   metoprolol tartrate 50 MG tablet Commonly known as: LOPRESSOR Take 1 tablet (50 mg total) by mouth 2 (two) times daily.   nystatin powder Generic drug: nystatin APPLY  POWDER TOPICALLY TWICE DAILY   predniSONE 20 MG tablet Commonly known as: DELTASONE Take by mouth.   tamsulosin 0.4 MG Caps capsule Commonly known as: FLOMAX Take 1 capsule (0.4 mg total) by mouth daily.       Allergies:  Allergies  Allergen Reactions  . Cortisone Anaphylaxis    Family History: Family History  Problem Relation Age of Onset  . Stroke Mother     Social History:  reports that he has never smoked. He has never used smokeless tobacco. He reports that he does not drink alcohol and does not use drugs.  ROS: For pertinent review of systems please refer to history of present  illness  Physical Exam: BP 137/81   Pulse (!) 132   Ht 6' (1.829 m)   Wt 205 lb (93 kg)   BMI 27.80 kg/m   Constitutional:  Well nourished. Alert and oriented, No acute distress. HEENT: [place] AT, mask in place.  Trachea midline Cardiovascular: No clubbing, cyanosis, or edema. Respiratory: Normal respiratory effort, no increased work of breathing. GU: No CVA tenderness.  No bladder fullness or masses.  Patient with uncircumcised phallus.  Foreskin easily retracted  Urethral meatus is patent.  No penile discharge. No penile lesions or rashes. Scrotum without lesions, cysts, rashes and/or edema.  Patient with large bilateral hydroceles.    Could not palpate the anterior scrotal contents . Neurologic: Grossly intact, no focal deficits, moving all 4 extremities. Psychiatric: Normal mood and affect.  Laboratory Data: Lab Results  Component Value Date   WBC 7.8 08/15/2018   HGB 16.4 08/15/2018   HCT 49.8 08/15/2018   MCV 85.1 08/15/2018   PLT 183 08/15/2018    Lab Results  Component Value Date   CREATININE 1.70 (H) 07/28/2019    Lab Results  Component Value Date   HGBA1C 6.6 (H) 04/21/2018    Pertinent Imaging: CLINICAL DATA:  Renal cysts.  EXAM: RENAL / URINARY TRACT ULTRASOUND COMPLETE  COMPARISON:  Mar 02, 2018.  FINDINGS: Right Kidney:  Renal measurements: 10.5 x 5.8 x 4.6 cm = volume: 147 mL. Increased echogenicity is noted consistent with medical renal disease. Greater than 10 cysts are noted. The largest is a simple cyst measuring 5.1 cm in the upper pole which is enlarged compared to prior exam. Another is a simple cyst measuring 2.9 cm in upper pole. A third simple cyst is noted in the upper pole measuring 1.7 cm. No mass or hydronephrosis visualized.  Left Kidney:  Renal measurements: 14.1 x 7.2 x 6.1 cm = volume: 328 mL. Increased echogenicity of renal parenchyma is noted suggesting medical renal disease. Greater than 10 cysts are noted. The largest  measures 6.5 cm in the midpole which is not significantly changed compared to prior exam. 2.8 cm simple cyst is noted in upper pole. 4.2 cm simple cyst is seen in lower pole. No mass or hydronephrosis visualized.  Bladder:  Appears normal for degree of bladder distention.  Other:  Mild to moderate prostatic enlargement is noted.  IMPRESSION: Increased echogenicity of renal parenchyma is noted bilaterally consistent with medical renal disease. Bilateral simple cysts are noted bilaterally. Most of these are unchanged compared to prior exam, although there is a 5.1 cm simple cyst in the upper pole of the right kidney which is slightly enlarged compared to prior exam. No hydronephrosis or renal obstruction is noted.   Electronically Signed   By: Marijo Conception M.D.   On: 08/30/2020 09:32 I have independently reviewed the films.  See HPI.     Assessment & Plan:    1. BPH with LUTS IPSS score is 8/5, it is stable Continue conservative management, avoiding bladder irritants and timed voiding's RTC in 12 month for I PSS and PVR  2. Renal cysts RUS with bilateral cysts Will continue with yearly surveillance of the cysts Patient states he missed his appointment with nephrology, but he will call and reschedule We will obtain a BMP at today's visit  3. Kidney stones Asymptomatic, can follow with serial imaging as well Not seen on recent RUS  4.  Bilateral hydroceles Explained to the patient that a hydrocele is a collection of peritoneal fluid between the parietal and visceral layers of the tunica vaginalis, which directly surrounds the testis and spermatic cord.  It is the same layer that forms the peritoneal lining of the abdomen. 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  Zara Council, PA-C  [facility] [address] [place], [place] [address] [phone]

## 2020-09-24 ENCOUNTER — Other Ambulatory Visit: Payer: Self-pay

## 2020-09-24 ENCOUNTER — Encounter: Payer: Self-pay | Admitting: Urology

## 2020-09-24 ENCOUNTER — Ambulatory Visit (INDEPENDENT_AMBULATORY_CARE_PROVIDER_SITE_OTHER): Payer: Medicare Other | Admitting: Urology

## 2020-09-24 VITALS — BP 137/81 | HR 132 | Ht 72.0 in | Wt 205.0 lb

## 2020-09-24 DIAGNOSIS — N281 Cyst of kidney, acquired: Secondary | ICD-10-CM | POA: Diagnosis not present

## 2020-09-24 DIAGNOSIS — N433 Hydrocele, unspecified: Secondary | ICD-10-CM

## 2020-09-25 ENCOUNTER — Telehealth: Payer: Self-pay | Admitting: Family Medicine

## 2020-09-25 LAB — BASIC METABOLIC PANEL
BUN/Creatinine Ratio: 19 (ref 10–24)
BUN: 33 mg/dL — ABNORMAL HIGH (ref 8–27)
CO2: 20 mmol/L (ref 20–29)
Calcium: 9.6 mg/dL (ref 8.6–10.2)
Chloride: 106 mmol/L (ref 96–106)
Creatinine, Ser: 1.77 mg/dL — ABNORMAL HIGH (ref 0.76–1.27)
GFR calc Af Amer: 41 mL/min/{1.73_m2} — ABNORMAL LOW (ref >59)
GFR calc non Af Amer: 35 mL/min/{1.73_m2} — ABNORMAL LOW (ref >59)
Glucose: 170 mg/dL — ABNORMAL HIGH (ref 65–99)
Potassium: 4.5 mmol/L (ref 3.5–5.2)
Sodium: 141 mmol/L (ref 134–144)

## 2020-09-25 NOTE — Telephone Encounter (Signed)
Patient notified and voiced understanding. He states he will call Nephrology.

## 2020-09-25 NOTE — Telephone Encounter (Signed)
-----   Message from Nori Riis, PA-C sent at 09/25/2020  8:11 AM EST ----- Please let Mr. Jablon know that his kidney function is diminished and I strongly recommend he call nephrology and schedule an appointment.

## 2020-11-04 NOTE — Telephone Encounter (Signed)
LMOM for patient to return call.

## 2020-11-04 NOTE — Telephone Encounter (Signed)
I do not see an upcoming appointment with nephrology in the chart for Mr. George Bolton.  Would you call him and see if he has an appointment with them?  If he doesn't, he needs to call them today and make one.

## 2020-11-07 NOTE — Telephone Encounter (Signed)
LMOM for patient to return call. 2nd attempt 

## 2020-11-08 NOTE — Telephone Encounter (Signed)
3rd attempt, [facility] for patient to return call.

## 2020-11-11 ENCOUNTER — Telehealth: Payer: Self-pay | Admitting: Urology

## 2020-11-11 DIAGNOSIS — N433 Hydrocele, unspecified: Secondary | ICD-10-CM

## 2020-11-11 NOTE — Telephone Encounter (Signed)
I had wanted George Bolton to have a scrotal ultrasound done at his last visit as he stated his scrotum had increased in size.  I have placed the orders. If we would call him and make him aware that scheduling will be calling him.

## 2020-11-12 NOTE — Telephone Encounter (Signed)
LMOM to return call.

## 2020-11-12 NOTE — Telephone Encounter (Signed)
Patient returned the call and was made aware of the ultrasound order and to be expecting a phone call from Radiology Scheduling department.

## 2020-11-25 ENCOUNTER — Encounter: Payer: Self-pay | Admitting: Urology

## 2021-01-03 ENCOUNTER — Emergency Department: Payer: Medicare Other

## 2021-01-03 ENCOUNTER — Other Ambulatory Visit: Payer: Self-pay

## 2021-01-03 ENCOUNTER — Emergency Department
Admission: EM | Admit: 2021-01-03 | Discharge: 2021-01-03 | Disposition: A | Discharge status: Home / Self Care | Payer: Medicare Other | Attending: Emergency Medicine | Admitting: Emergency Medicine

## 2021-01-03 DIAGNOSIS — I5033 Acute on chronic diastolic (congestive) heart failure: Secondary | ICD-10-CM | POA: Insufficient documentation

## 2021-01-03 DIAGNOSIS — R0602 Shortness of breath: Secondary | ICD-10-CM | POA: Insufficient documentation

## 2021-01-03 DIAGNOSIS — I4891 Unspecified atrial fibrillation: Secondary | ICD-10-CM | POA: Insufficient documentation

## 2021-01-03 DIAGNOSIS — S20212A Contusion of left front wall of thorax, initial encounter: Secondary | ICD-10-CM | POA: Diagnosis not present

## 2021-01-03 DIAGNOSIS — Z79899 Other long term (current) drug therapy: Secondary | ICD-10-CM | POA: Insufficient documentation

## 2021-01-03 DIAGNOSIS — Z7982 Long term (current) use of aspirin: Secondary | ICD-10-CM | POA: Insufficient documentation

## 2021-01-03 DIAGNOSIS — W01198A Fall on same level from slipping, tripping and stumbling with subsequent striking against other object, initial encounter: Secondary | ICD-10-CM | POA: Diagnosis not present

## 2021-01-03 DIAGNOSIS — S299XXA Unspecified injury of thorax, initial encounter: Secondary | ICD-10-CM | POA: Diagnosis present

## 2021-01-03 DIAGNOSIS — I509 Heart failure, unspecified: Secondary | ICD-10-CM

## 2021-01-03 DIAGNOSIS — I110 Hypertensive heart disease with heart failure: Secondary | ICD-10-CM | POA: Diagnosis not present

## 2021-01-03 DIAGNOSIS — Z7901 Long term (current) use of anticoagulants: Secondary | ICD-10-CM | POA: Insufficient documentation

## 2021-01-03 LAB — CBC WITH DIFFERENTIAL/PLATELET
Abs Immature Granulocytes: 0.03 10*3/uL (ref 0.00–0.07)
Basophils Absolute: 0 10*3/uL (ref 0.0–0.1)
Basophils Relative: 1 %
Eosinophils Absolute: 0.1 10*3/uL (ref 0.0–0.5)
Eosinophils Relative: 1 %
HCT: 41.8 % (ref 39.0–52.0)
Hemoglobin: 13.4 g/dL (ref 13.0–17.0)
Immature Granulocytes: 1 %
Lymphocytes Relative: 17 %
Lymphs Abs: 1.1 10*3/uL (ref 0.7–4.0)
MCH: 27.7 pg (ref 26.0–34.0)
MCHC: 32.1 g/dL (ref 30.0–36.0)
MCV: 86.4 fL (ref 80.0–100.0)
Monocytes Absolute: 0.5 10*3/uL (ref 0.1–1.0)
Monocytes Relative: 7 %
Neutro Abs: 4.8 10*3/uL (ref 1.7–7.7)
Neutrophils Relative %: 73 %
Platelets: 164 10*3/uL (ref 150–400)
RBC: 4.84 MIL/uL (ref 4.22–5.81)
RDW: 16.4 % — ABNORMAL HIGH (ref 11.5–15.5)
WBC: 6.5 10*3/uL (ref 4.0–10.5)
nRBC: 0 % (ref 0.0–0.2)

## 2021-01-03 LAB — TROPONIN I (HIGH SENSITIVITY)
Troponin I (High Sensitivity): 23 ng/L — ABNORMAL HIGH (ref <18)
Troponin I (High Sensitivity): 26 ng/L — ABNORMAL HIGH (ref <18)

## 2021-01-03 LAB — COMPREHENSIVE METABOLIC PANEL
ALT: 13 U/L (ref 0–44)
AST: 19 U/L (ref 15–41)
Albumin: 3.7 g/dL (ref 3.5–5.0)
Alkaline Phosphatase: 68 U/L (ref 38–126)
Anion gap: 9 (ref 5–15)
BUN: 25 mg/dL — ABNORMAL HIGH (ref 8–23)
CO2: 19 mmol/L — ABNORMAL LOW (ref 22–32)
Calcium: 9.6 mg/dL (ref 8.9–10.3)
Chloride: 113 mmol/L — ABNORMAL HIGH (ref 98–111)
Creatinine, Ser: 1.6 mg/dL — ABNORMAL HIGH (ref 0.61–1.24)
GFR, Estimated: 43 mL/min — ABNORMAL LOW (ref >60)
Glucose, Bld: 157 mg/dL — ABNORMAL HIGH (ref 70–99)
Potassium: 4.1 mmol/L (ref 3.5–5.1)
Sodium: 141 mmol/L (ref 135–145)
Total Bilirubin: 1.2 mg/dL (ref 0.3–1.2)
Total Protein: 7.4 g/dL (ref 6.5–8.1)

## 2021-01-03 LAB — BRAIN NATRIURETIC PEPTIDE: B Natriuretic Peptide: 717.8 pg/mL — ABNORMAL HIGH (ref 0.0–100.0)

## 2021-01-03 IMAGING — CT CT CHEST W/O CM
2 of 3 series · 15 of 36 positions shown, 18 images · non-contrast
Comparison: Same day chest radiograph

CLINICAL DATA: Rib fracture suspected, recent fall, shortness of
breath, chronic dyspnea

EXAM:
CT CHEST WITHOUT CONTRAST
TECHNIQUE: Multidetector CT imaging of the chest was performed following the
standard protocol without IV contrast.

[Series 2: thorax · axial · 0.82mm/px · z∈[-550,-302]mm · 12 of 146 slices shown, 15 images]
[im 11/146  mediastinal]
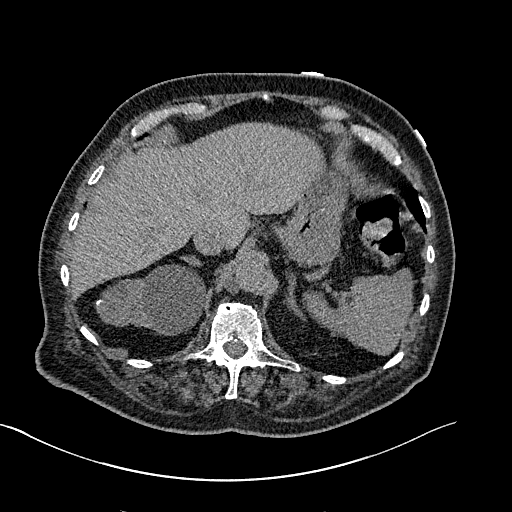
[im 11/146  lung]
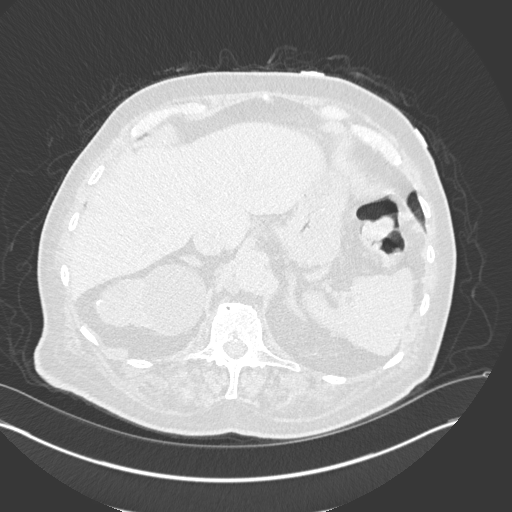
[im 22/146  lung]
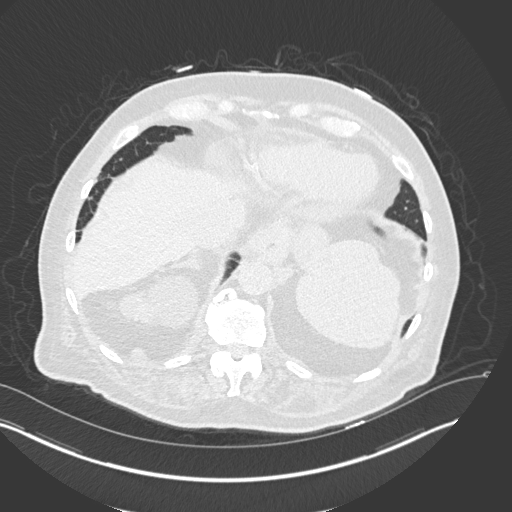
[im 33/146  lung]
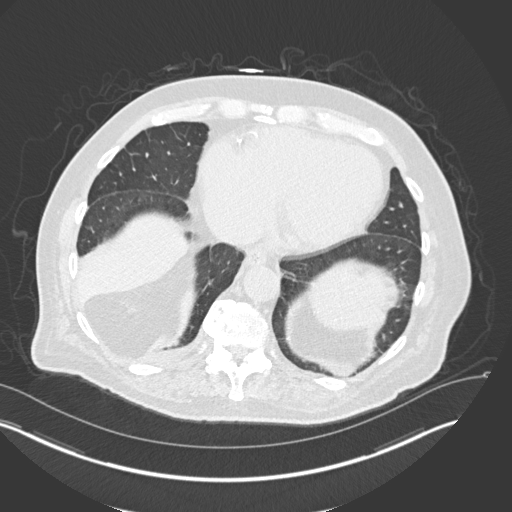
[im 43/146  lung]
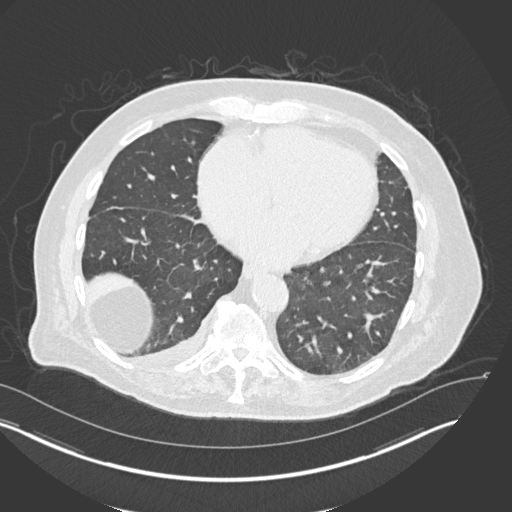
[im 54/146  mediastinal]
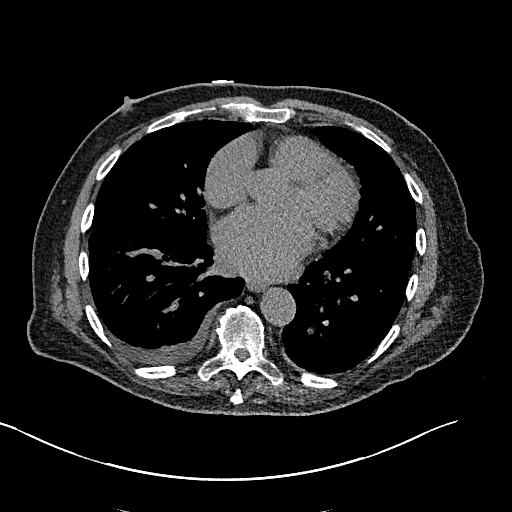
[im 54/146  lung]
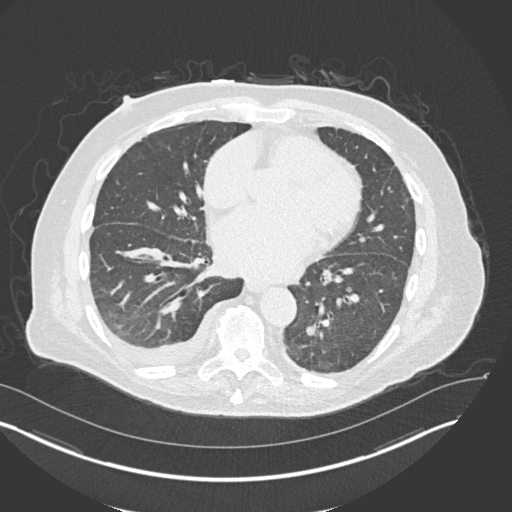
[im 65/146  lung]
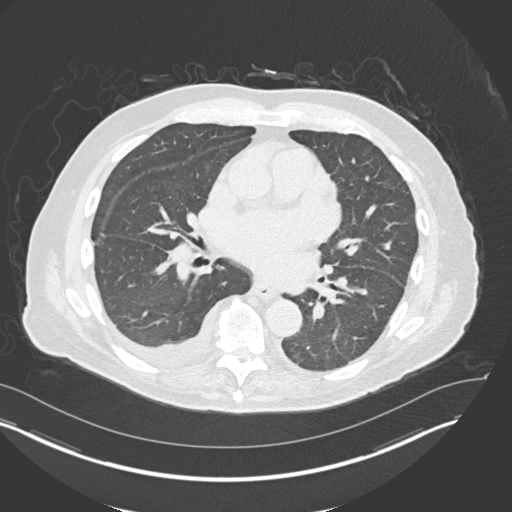
[im 81/146  lung]
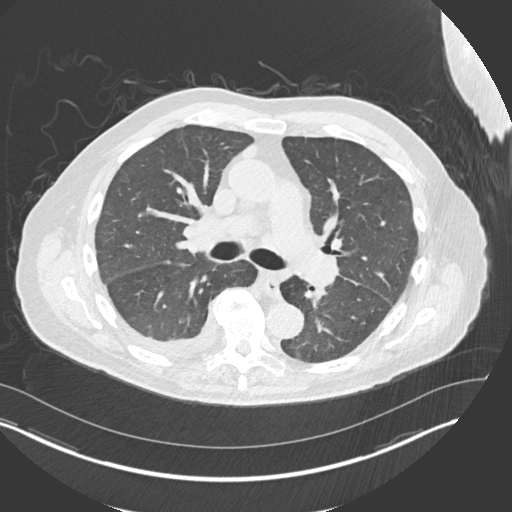
[im 92/146  lung]
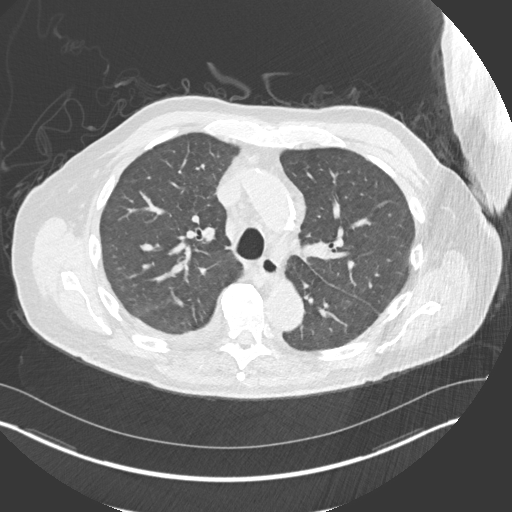
[im 103/146  mediastinal]
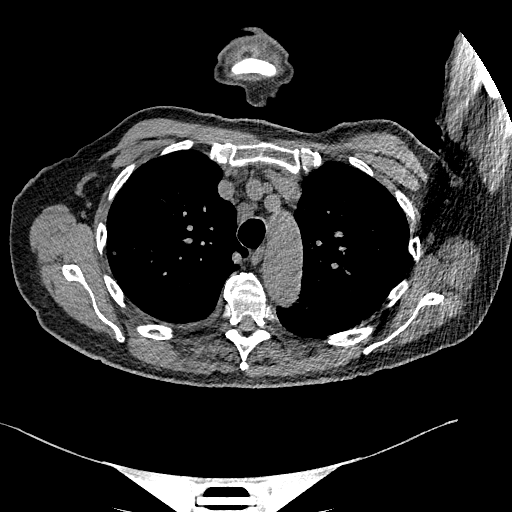
[im 103/146  lung]
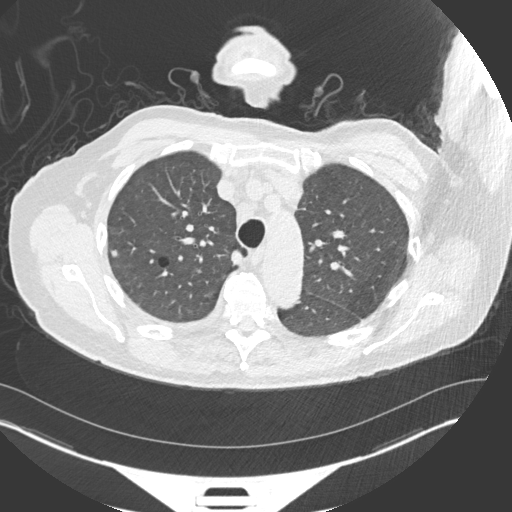
[im 113/146  lung]
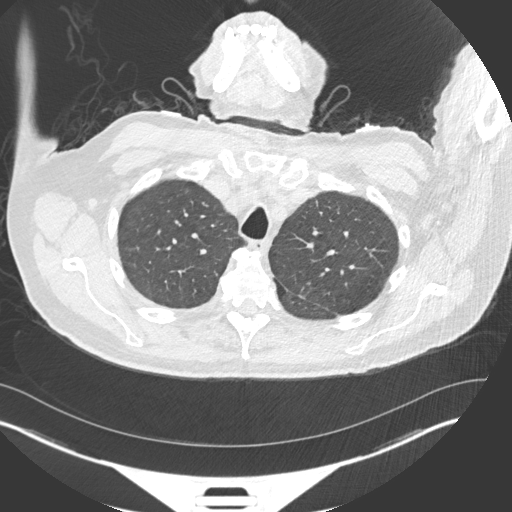
[im 124/146  lung]
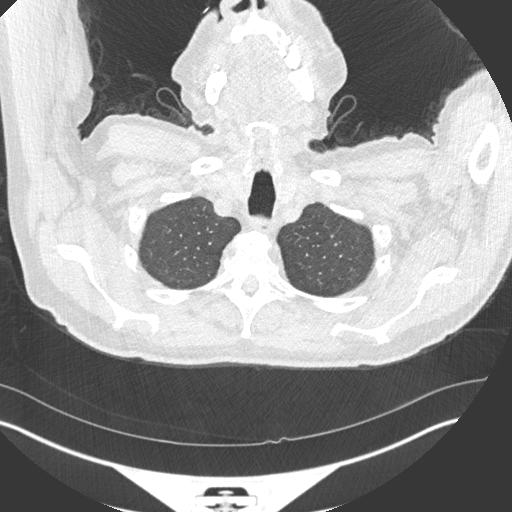
[im 135/146  lung]
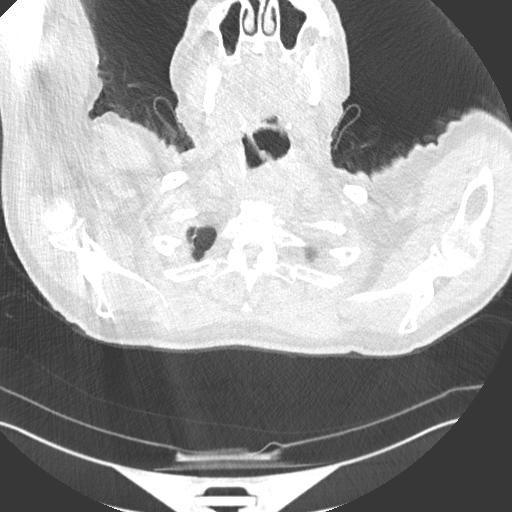

[Series 5: coronal · coronal · 0.59mm/px · 3 of 146 slices shown]
[im 30/146  lung]
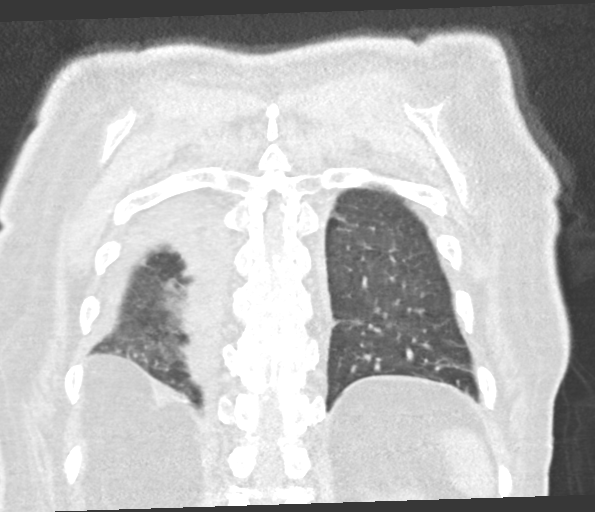
[im 59/146  lung]
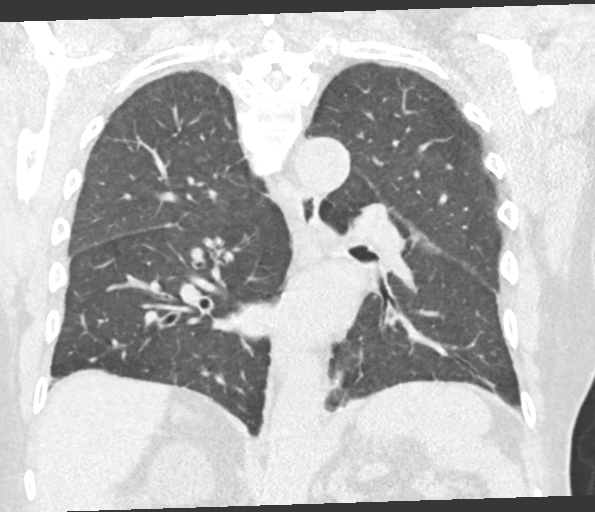
[im 88/146  lung]
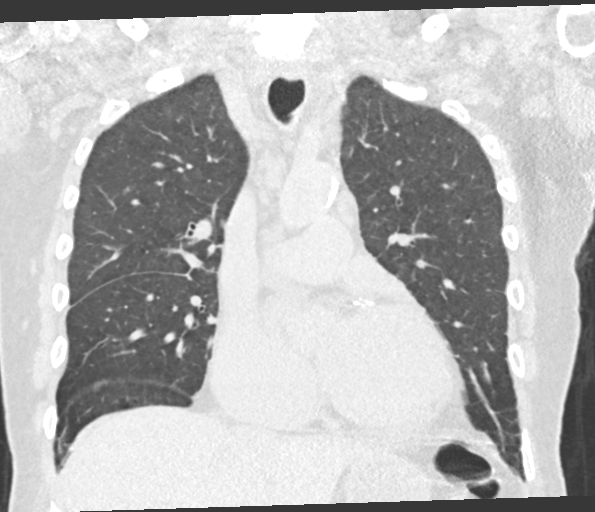

[15 of 36 positions shown; findings below may reference images not displayed]

FINDINGS: Cardiovascular: Aortic atherosclerosis. Normal heart size.
Three-vessel coronary artery calcifications. No pericardial
effusion.

Mediastinum/Nodes: Numerous enlarged mediastinal lymph nodes,
largest anterior mediastinal node measuring 1.6 x 1.1 cm (series 2,
image 62). Thyroid gland, trachea, and esophagus demonstrate no
significant findings.

Lungs/Pleura: Trace bilateral pleural effusions. Bibasilar
interlobular septal thickening. Diffuse bilateral bronchial wall
thickening. Occasional small pulmonary nodules measuring up to 5 mm
in the peripheral right upper lobe (series 3, image 44).

Upper Abdomen: No acute abnormality.

Musculoskeletal: No chest wall mass or suspicious bone lesions
identified. Disc degenerative disease and ankylosis of the thoracic
spine.
IMPRESSION: 1. No displaced rib fracture.
2. Trace bilateral pleural effusions, bibasilar interlobular septal
thickening, and diffuse bilateral bronchial wall thickening findings
consistent with pulmonary edema.
3. Occasional small pulmonary nodules measuring up to 5 mm in the
peripheral right upper lobe.
4. Numerous enlarged mediastinal lymph nodes, nonspecific, likely
reactive.
5. Coronary artery disease.

Aortic Atherosclerosis ([09]-[09]).

## 2021-01-03 IMAGING — CR DG CHEST 2V
1 series · 2 of 2 positions shown · non-contrast
Comparison: [DATE]

CLINICAL DATA: Shortness of breath.

EXAM:
CHEST - 2 VIEW

[Series 1: dg chest 2 view · 0.14mm/px · 2 of 2 slices shown]
[im 1/2]
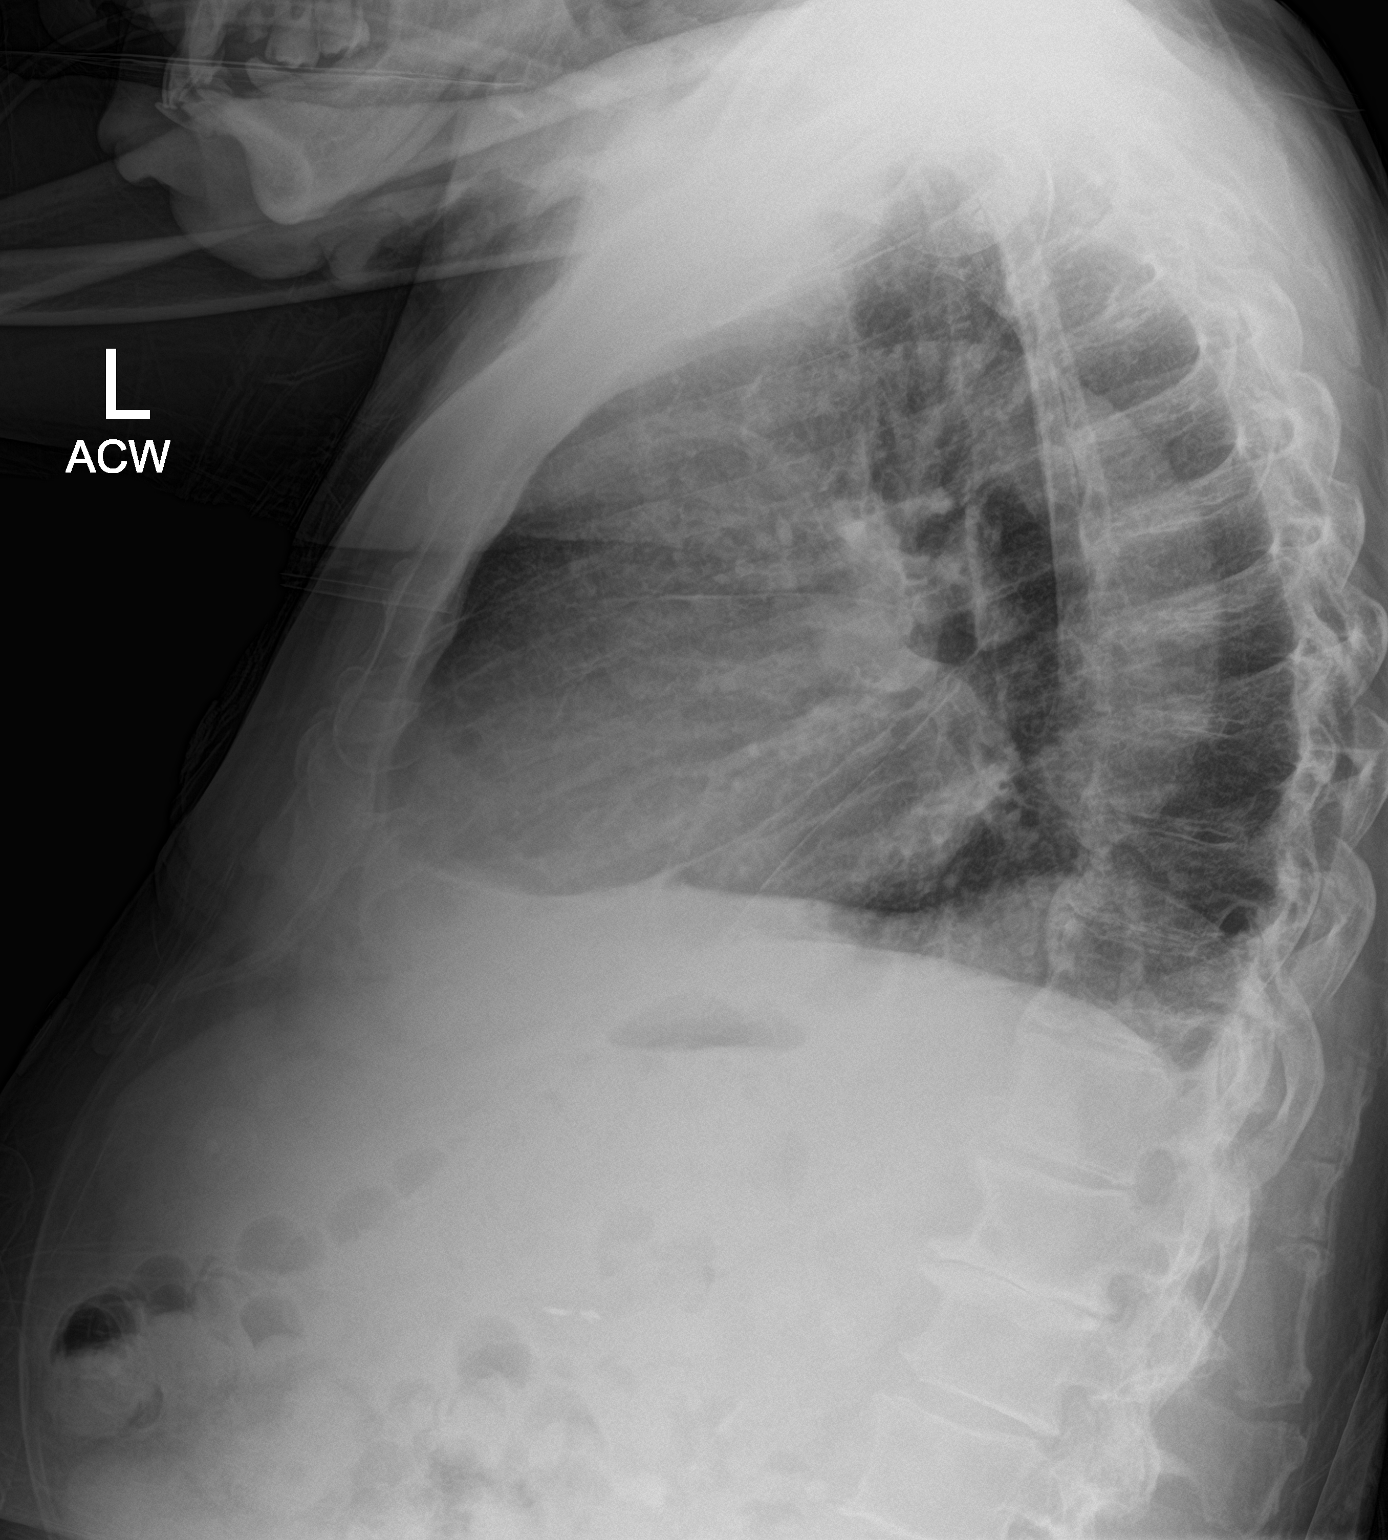
[im 2/2]
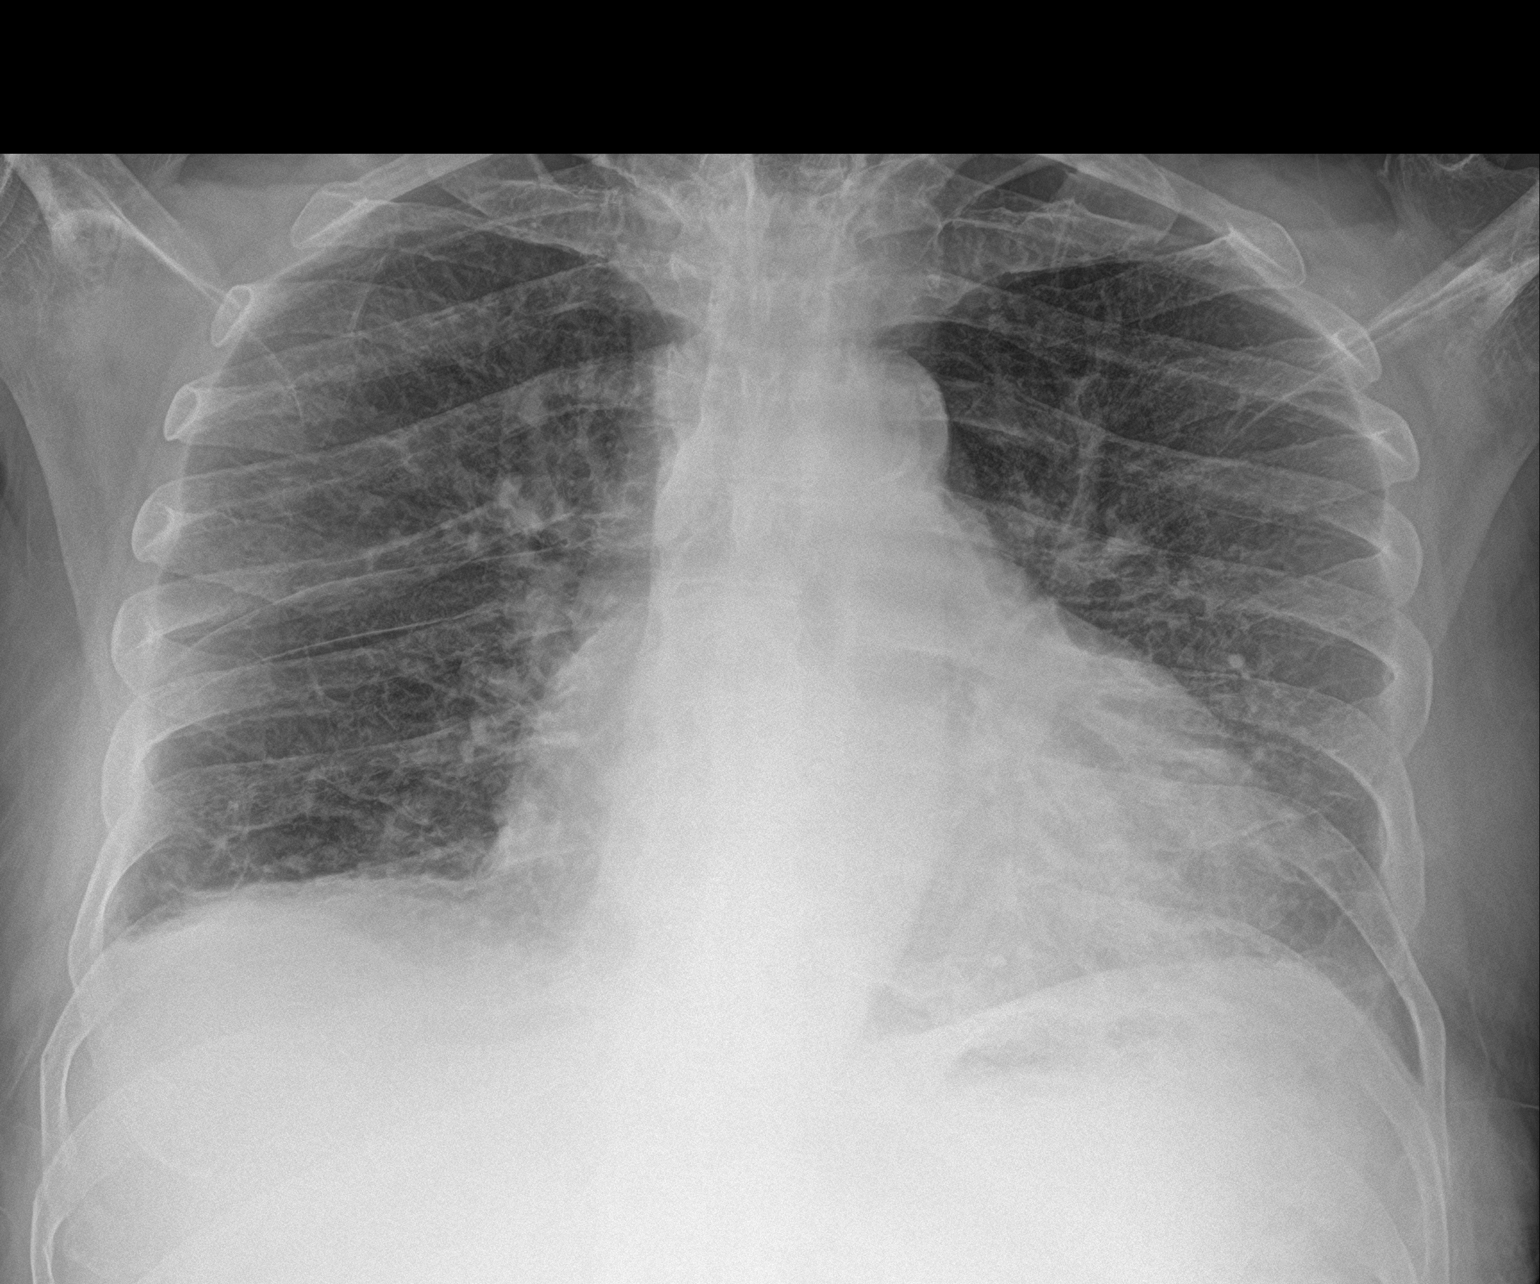

[2 of 2 positions shown; findings below may reference images not displayed]

FINDINGS: The heart is mildly enlarged. There is tortuosity and calcification
of the thoracic aorta. Increased interstitial markings and mild
peribronchial thickening could suggest mild interstitial edema.
Streaky basilar atelectasis. No definite infiltrates. Possible small
right pleural effusion.

The bony thorax is intact.
IMPRESSION: Suspect mild interstitial edema.

## 2021-01-03 MED ORDER — FUROSEMIDE 10 MG/ML IJ SOLN
INTRAMUSCULAR | Status: AC
  Administered 2021-01-03: 80 mg via INTRAVENOUS
  Filled 2021-01-03: qty 8

## 2021-01-03 MED ORDER — FUROSEMIDE 10 MG/ML IJ SOLN: 80.0000 mg | Freq: Once | INTRAMUSCULAR | Status: AC

## 2021-01-03 MED ORDER — FUROSEMIDE 40 MG PO TABS: 40.0000 mg | ORAL_TABLET | Freq: Once | ORAL | Status: DC

## 2021-01-03 NOTE — ED Provider Notes (Signed)
[facility] Emergency Department Provider Note ____________________________________________   Event Date/Time   First MD Initiated Contact with Patient 01/03/21 1915     (approximate)  I have reviewed the triage vital signs and the nursing notes.   HISTORY  Chief Complaint Fall and Shortness of Breath    HPI George Bolton is a 80 y.o. male with PMH as noted below including atrial fibrillation and CHF who presents with worsening shortness of breath over approximately the last 4 days after a fall.  The patient states that he tripped and fell, hitting the left side of his chest.  He has had some rib pain there and has had some increased shortness of breath since the fall.  He denies any chest pain, cough, fever or chills, or any back pain or other injuries.  He has no new leg swelling.  Past Medical History:  Diagnosis Date  . Chronic atrial fibrillation ([place])    a. Afib dates back to at least 2002 when reviewing prior EKG with EKGs from 2014 onward showing Afib; b. CHADS2VASc at least 4 (CHF, HTN, age x 2)  . Chronic diastolic CHF (congestive heart failure) ([place])    a. TTE 5/19: EF 70-75%, mod LVH, near complete obliteration of the mid and apical LV cavity (can be seen in apical hypertrophic CM), mildly dilated LA, RV cavity size nl, RV wall thickness nl, RVSF mildly reduced, mildly dilated RA; b. 02/2018 Limited echo w/ definity: EF 50-65%, no apical hypertrophic CM.  Marland Kitchen Gout   . Hypertension     Patient Active Problem List   Diagnosis Date Noted  . Chronic diastolic congestive heart failure ([place]) 12/01/2018  . Atrial fibrillation ([place]) 03/15/2018  . Lymphedema 03/15/2018  . Gout 01/25/2017  . CHF (congestive heart failure) ([place]) 04/25/2015  . Hypertension 04/25/2015    Past Surgical History:  Procedure Laterality Date  . CHOLECYSTECTOMY N/A 01/13/2017   Procedure: LAPAROSCOPIC CHOLECYSTECTOMY;  Surgeon: Jules Husbands, MD;  Location: [facility] ORS;   Service: General;  Laterality: N/A;    Prior to Admission medications   Medication Sig Start Date End Date Taking? Authorizing Provider  apixaban (ELIQUIS) 2.5 MG TABS tablet Take by mouth. 12/27/19   [provider]  aspirin 325 MG tablet Take 325 mg by mouth daily.    [provider]  atorvastatin (LIPITOR) 40 MG tablet Take 1 tablet by mouth daily.    [provider]  cefadroxil (DURICEF) 500 MG capsule Take 500 mg by mouth 2 (two) times daily. 05/31/20   [provider]  cloNIDine (CATAPRES) 0.1 MG tablet Take 1 tablet by mouth 2 (two) times daily. 05/28/20   [provider]  colchicine 0.6 MG tablet Take by mouth. 12/27/19   [provider]  felodipine (PLENDIL) 10 MG 24 hr tablet Take 10 mg by mouth daily.    [provider]  finasteride (PROSCAR) 5 MG tablet Take 1 tablet (5 mg total) by mouth daily. 08/21/20   Zara Council A, PA-C  furosemide (LASIX) 40 MG tablet Take 1 tablet (40 mg total) by mouth daily. 04/24/18   Hillary Bow, MD  hydrALAZINE (APRESOLINE) 25 MG tablet Take 1 tablet (25 mg total) by mouth 3 (three) times daily. 09/01/18 08/16/19  Alisa Graff, FNP  HYDROcodone-acetaminophen (NORCO/VICODIN) 5-325 MG tablet Take one tablet at night for pain; may take up to every 6 hours as needed for pain if not working or driving [id number]   [provider]  isosorbide mononitrate (IMDUR) 30 MG 24 hr tablet Take by mouth. 12/27/19 12/26/20  [provider]  metoprolol tartrate (LOPRESSOR) 50 MG tablet Take 1 tablet (50 mg total) by mouth 2 (two) times daily. 08/12/18   Alisa Graff, FNP  NYSTATIN powder APPLY  POWDER TOPICALLY TWICE DAILY 09/26/19   Zara Council A, PA-C  tamsulosin (FLOMAX) 0.4 MG CAPS capsule Take 1 capsule (0.4 mg total) by mouth daily. 08/21/20   Zara Council A, PA-C    Allergies Cortisone  Family History  Problem Relation Age of Onset  . Stroke Mother     Social  History Social History   Tobacco Use  . Smoking status: Never Smoker  . Smokeless tobacco: Never Used  Substance Use Topics  . Alcohol use: No  . Drug use: No    Review of Systems  Constitutional: No fever. Eyes: No visual changes. ENT: No sore throat. Cardiovascular: Denies chest pain. Respiratory: Positive for shortness of breath. Gastrointestinal: No vomiting or diarrhea.  Genitourinary: Negative for dysuria.  Musculoskeletal: Negative for back pain.  Positive for left anterior lower rib pain. Skin: Negative for rash. Neurological: Negative for headaches, focal weakness or numbness.   ____________________________________________   PHYSICAL EXAM:  VITAL SIGNS: ED Triage Vitals  Enc Vitals Group     BP 01/03/21 1635 (!) 163/107     Pulse Rate 01/03/21 1635 80     Resp 01/03/21 1635 16     Temp 01/03/21 1635 98.2 F (36.8 C)     Temp Source 01/03/21 1635 Oral     SpO2 01/03/21 1635 95 %     Weight 01/03/21 1632 192 lb (87.1 kg)     Height 01/03/21 1632 6' (1.829 m)     Head Circumference --      Peak Flow --      Pain Score 01/03/21 1631 0     Pain Loc --      Pain Edu? --      Excl. in [place]? --     Constitutional: Alert and oriented.  Relatively well appearing and in no acute distress. Eyes: Conjunctivae are normal.  Head: Atraumatic. Nose: No congestion/rhinnorhea. Mouth/Throat: Mucous membranes are moist.   Neck: Normal range of motion.  Cardiovascular: Normal rate, regular rhythm. Grossly normal heart sounds.  Good peripheral circulation. Respiratory: Normal respiratory effort.  No retractions.  Faint Rales to bilateral lung bases. Gastrointestinal: Soft and nontender. No distention.  Genitourinary: No flank tenderness. Musculoskeletal: Bilateral lower extremity edema, chronic.  Extremities warm and well perfused.  Mild left anterior lower rib border tenderness. Neurologic:  Normal speech and language. No gross focal neurologic deficits are appreciated.   Skin:  Skin is warm and dry. No rash noted. Psychiatric: Mood and affect are normal. Speech and behavior are normal.  ____________________________________________   LABS (all labs ordered are listed, but only abnormal results are displayed)  Labs Reviewed  CBC WITH DIFFERENTIAL/PLATELET - Abnormal; Notable for the following components:      Result Value   RDW 16.4 (*)    All other components within normal limits  COMPREHENSIVE METABOLIC PANEL - Abnormal; Notable for the following components:   Chloride 113 (*)    CO2 19 (*)    Glucose, Bld 157 (*)    BUN 25 (*)    Creatinine, Ser 1.60 (*)    GFR, Estimated 43 (*)    All other components within normal limits  BRAIN NATRIURETIC PEPTIDE - Abnormal; Notable for the following components:  B Natriuretic Peptide 717.8 (*)    All other components within normal limits  TROPONIN I (HIGH SENSITIVITY) - Abnormal; Notable for the following components:   Troponin I (High Sensitivity) 23 (*)    All other components within normal limits  TROPONIN I (HIGH SENSITIVITY) - Abnormal; Notable for the following components:   Troponin I (High Sensitivity) 26 (*)    All other components within normal limits   ____________________________________________  EKG  ED ECG REPORT I, Arta Silence, the attending physician, personally viewed and interpreted this ECG.  Date: 01/03/2021 EKG Time: 1642 Rate: 88 Rhythm: Atrial fibrillation QRS Axis: normal Intervals: normal ST/T Wave abnormalities: Nonspecific T wave abnormalities Narrative Interpretation: no evidence of acute ischemia  ____________________________________________  RADIOLOGY  Chest x-ray interpreted by me shows mild interstitial edema with no focal opacity CT chest: Pulmonary edema, no rib fractures or contusion  ____________________________________________   PROCEDURES  Procedure(s) performed: No  Procedures  Critical Care performed:  No ____________________________________________   INITIAL IMPRESSION / ASSESSMENT AND PLAN / ED COURSE  Pertinent labs & imaging results that were available during my care of the patient were reviewed by me and considered in my medical decision making (see chart for details).  80 year old male with PMH as noted above including atrial fibrillation and CHF presents with increased shortness of breath over the last several days after a fall in which he hurt the left anterior ribs.  He denies any acute peripheral edema.  He has no chest pain, cough, or fever.  I reviewed the past medical records in [place].  The patient has no recent prior ED visits or admissions.  On exam, he is overall well-appearing.  His vital signs are normal except for hypertension.  O2 saturation is in the mid to high 90s on room air.  There are faint rales to bilateral lung bases.  He has chronic appearing bilateral leg edema, somewhat worse on the left, which he states is unchanged.  There is mild tenderness to the left lower anterior ribs.  Overall presentation is most consistent with CHF, and chest x-ray obtained from triage shows likely edema.  However the patient has no acute respiratory distress and normal O2 saturation.  I have a low suspicion for rib fracture or pulmonary contusion.  Given the negative x-ray, we will obtain a CT chest for further evaluation.  We will obtain cardiac labs and reassess.  ----------------------------------------- 9:14 PM on 01/03/2021 -----------------------------------------  CT chest shows pulmonary edema with no evidence of rib fracture or other traumatic findings.  I ordered a dose of IV Lasix, although given the lack of hypoxia or respiratory distress I think the patient will be appropriate for discharge home.  Initial troponin is minimally elevated which I think is likely due to CHF.  We will obtain a repeat after 2 hours.  ----------------------------------------- 10:54 PM on  01/03/2021 -----------------------------------------  Repeat troponin is unchanged.  The patient continues to have no active chest pain or other ACS symptoms.  There is no evidence of ACS at this time.  He continues to have no active shortness of breath or hypoxia.  He feels well and is stable for discharge home.  I counseled him and his son on the results of the work-up.  I encouraged him to take his Lasix consistently as prescribed and to follow-up with his doctor.  Return precautions given, and he expresses understanding.  ____________________________________________   FINAL CLINICAL IMPRESSION(S) / ED DIAGNOSES  Final diagnoses:  Contusion of left chest wall,  initial encounter  Acute on chronic congestive heart failure, unspecified heart failure type ([place])      NEW MEDICATIONS STARTED DURING THIS VISIT:  New Prescriptions   No medications on file     Note:  This document was prepared using Dragon voice recognition software and may include unintentional dictation errors.   Arta Silence, MD 01/03/21 2255

## 2021-01-03 NOTE — Discharge Instructions (Addendum)
Continue taking the Lasix as prescribed.  Follow-up with your regular doctor within the next 1 to 2 weeks.  Return to the ER for new, worsening, or persistent severe shortness of breath, chest pain, weakness or dizziness, or any other new or worsening symptoms that concern you.

## 2021-01-03 NOTE — ED Triage Notes (Signed)
Pt to ER with complaints of shortness of breath after falling over a side table four days ago and injuring the left side of his ribs.  Pt reports being short of breath since. Pt denies LOC/ hitting head when he fell. Pt reports taking eliquis.

## 2021-02-15 ENCOUNTER — Emergency Department: Payer: Medicare Other

## 2021-02-15 ENCOUNTER — Emergency Department
Admission: EM | Admit: 2021-02-15 | Discharge: 2021-02-15 | Disposition: A | Discharge status: Home / Self Care | Payer: Medicare Other | Attending: Emergency Medicine | Admitting: Emergency Medicine

## 2021-02-15 DIAGNOSIS — I4821 Permanent atrial fibrillation: Secondary | ICD-10-CM | POA: Insufficient documentation

## 2021-02-15 DIAGNOSIS — Z79899 Other long term (current) drug therapy: Secondary | ICD-10-CM | POA: Insufficient documentation

## 2021-02-15 DIAGNOSIS — M109 Gout, unspecified: Secondary | ICD-10-CM | POA: Insufficient documentation

## 2021-02-15 DIAGNOSIS — I110 Hypertensive heart disease with heart failure: Secondary | ICD-10-CM | POA: Insufficient documentation

## 2021-02-15 DIAGNOSIS — Z7901 Long term (current) use of anticoagulants: Secondary | ICD-10-CM | POA: Insufficient documentation

## 2021-02-15 DIAGNOSIS — I890 Lymphedema, not elsewhere classified: Secondary | ICD-10-CM | POA: Diagnosis not present

## 2021-02-15 DIAGNOSIS — I5032 Chronic diastolic (congestive) heart failure: Secondary | ICD-10-CM | POA: Diagnosis not present

## 2021-02-15 DIAGNOSIS — M79602 Pain in left arm: Secondary | ICD-10-CM | POA: Diagnosis present

## 2021-02-15 LAB — BASIC METABOLIC PANEL
Anion gap: 13 (ref 5–15)
BUN: 22 mg/dL (ref 8–23)
CO2: 19 mmol/L — ABNORMAL LOW (ref 22–32)
Calcium: 9.1 mg/dL (ref 8.9–10.3)
Chloride: 107 mmol/L (ref 98–111)
Creatinine, Ser: 1.7 mg/dL — ABNORMAL HIGH (ref 0.61–1.24)
GFR, Estimated: 40 mL/min — ABNORMAL LOW (ref >60)
Glucose, Bld: 124 mg/dL — ABNORMAL HIGH (ref 70–99)
Potassium: 3.4 mmol/L — ABNORMAL LOW (ref 3.5–5.1)
Sodium: 139 mmol/L (ref 135–145)

## 2021-02-15 LAB — CBC WITH DIFFERENTIAL/PLATELET
Abs Immature Granulocytes: 0.02 10*3/uL (ref 0.00–0.07)
Basophils Absolute: 0 10*3/uL (ref 0.0–0.1)
Basophils Relative: 0 %
Eosinophils Absolute: 0 10*3/uL (ref 0.0–0.5)
Eosinophils Relative: 0 %
HCT: 39 % (ref 39.0–52.0)
Hemoglobin: 12.8 g/dL — ABNORMAL LOW (ref 13.0–17.0)
Immature Granulocytes: 0 %
Lymphocytes Relative: 9 %
Lymphs Abs: 0.8 10*3/uL (ref 0.7–4.0)
MCH: 27.4 pg (ref 26.0–34.0)
MCHC: 32.8 g/dL (ref 30.0–36.0)
MCV: 83.5 fL (ref 80.0–100.0)
Monocytes Absolute: 0.8 10*3/uL (ref 0.1–1.0)
Monocytes Relative: 9 %
Neutro Abs: 7.3 10*3/uL (ref 1.7–7.7)
Neutrophils Relative %: 82 %
Platelets: 170 10*3/uL (ref 150–400)
RBC: 4.67 MIL/uL (ref 4.22–5.81)
RDW: 15.5 % (ref 11.5–15.5)
WBC: 8.9 10*3/uL (ref 4.0–10.5)
nRBC: 0 % (ref 0.0–0.2)

## 2021-02-15 LAB — SEDIMENTATION RATE: Sed Rate: 53 mm/hr — ABNORMAL HIGH (ref 0–20)

## 2021-02-15 LAB — URIC ACID: Uric Acid, Serum: 5.9 mg/dL (ref 3.7–8.6)

## 2021-02-15 IMAGING — US US EXTREM LOW VENOUS*L*
1 series · 14 of 24 positions shown · non-contrast
Comparison: None.

CLINICAL DATA: Left leg swelling

EXAM:
Left LOWER EXTREMITY VENOUS DOPPLER ULTRASOUND
TECHNIQUE: Gray-scale sonography with compression, as well as color and duplex
ultrasound, were performed to evaluate the deep venous system(s)
from the level of the common femoral vein through the popliteal and
proximal calf veins.

[Series 1: us venous img lower uni left (dvt) · portal-venous · 14 of 34 slices shown]
[im 1/34]
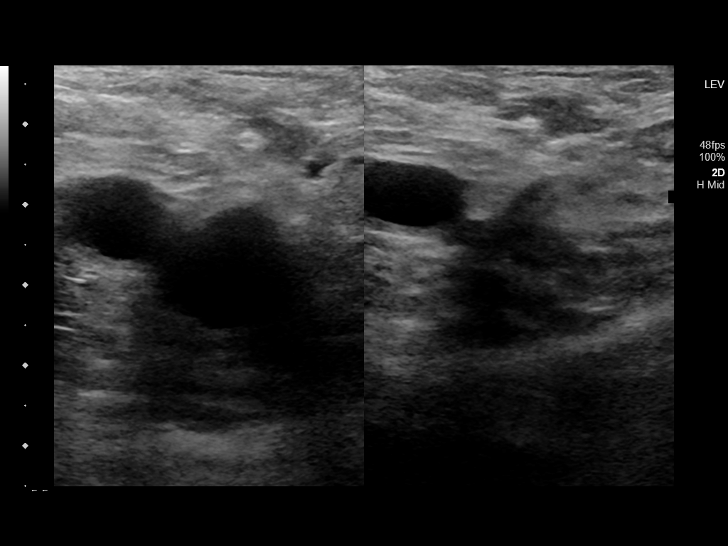
[im 3/34]
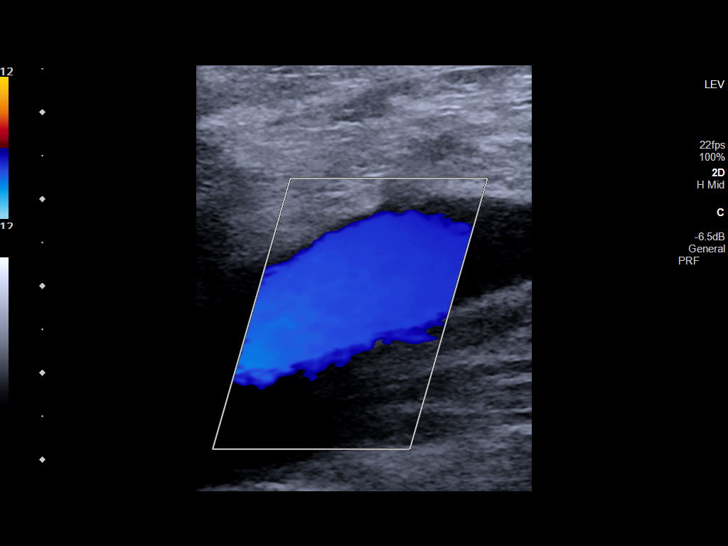
[im 6/34]
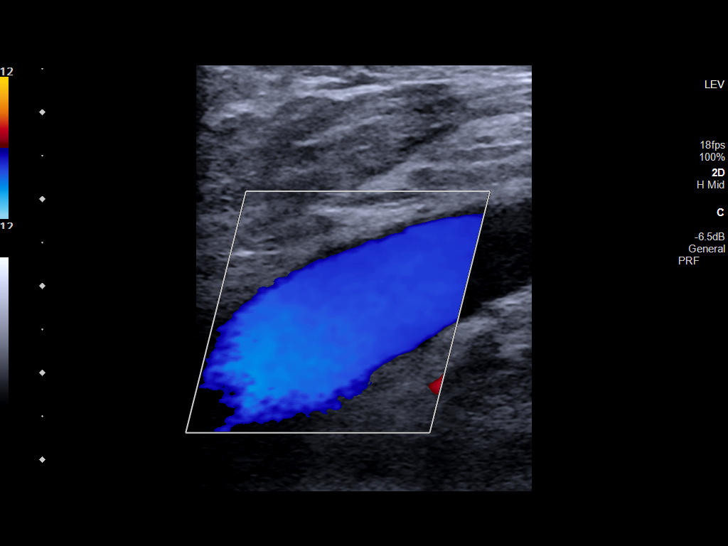
[im 9/34]
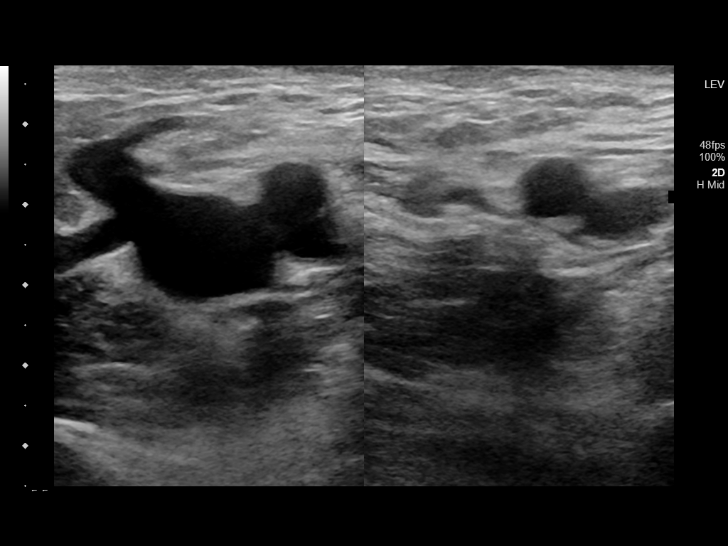
[im 11/34]
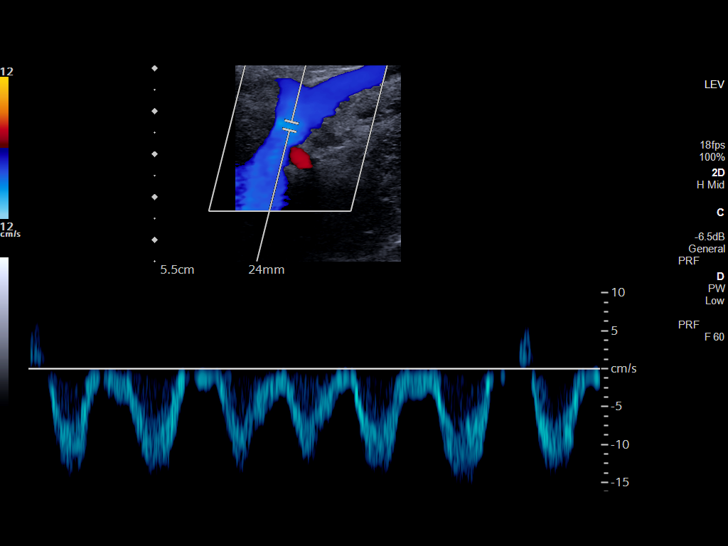
[im 13/34]
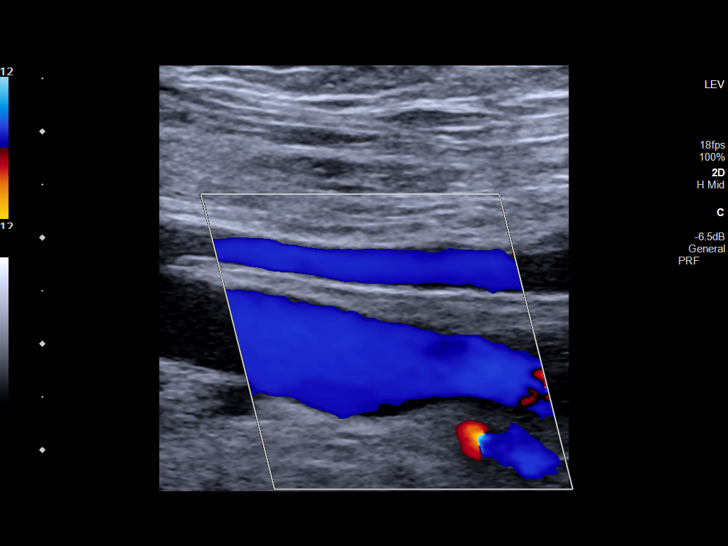
[im 16/34]
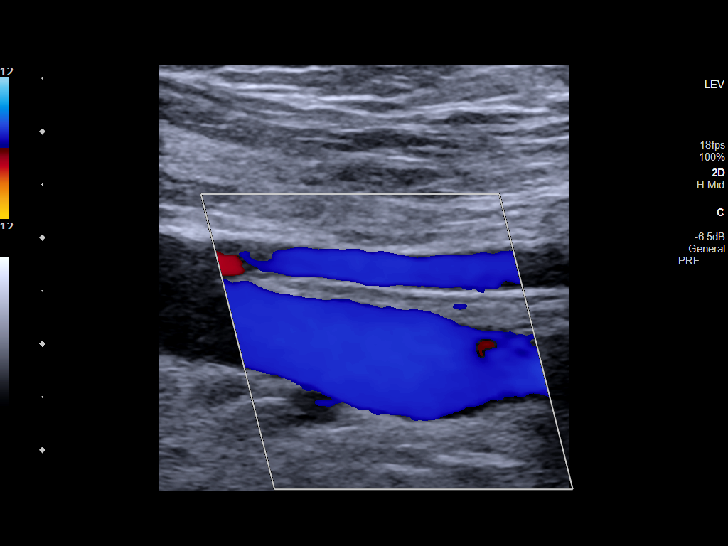
[im 18/34]
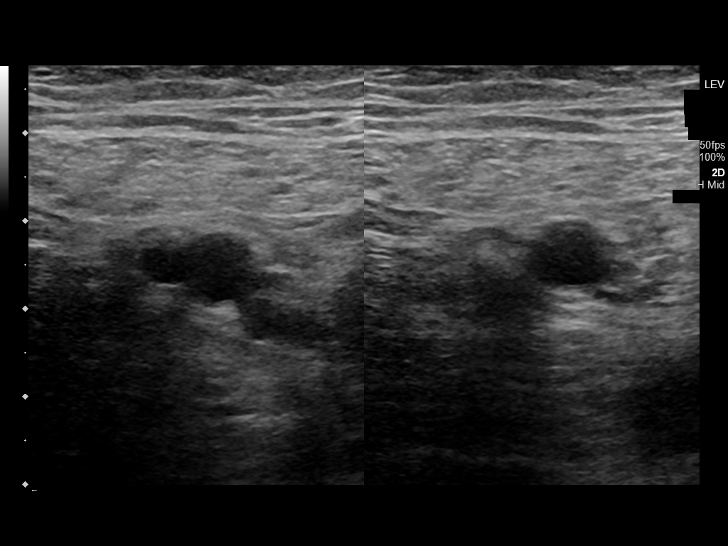
[im 21/34]
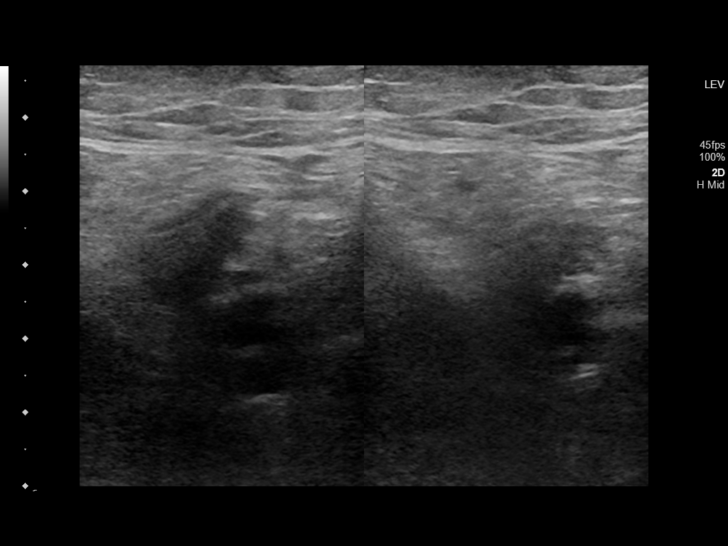
[im 23/34]
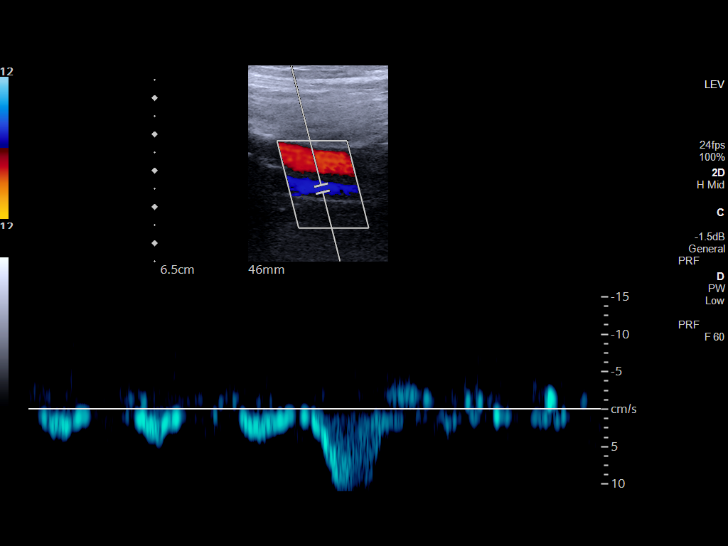
[im 26/34]
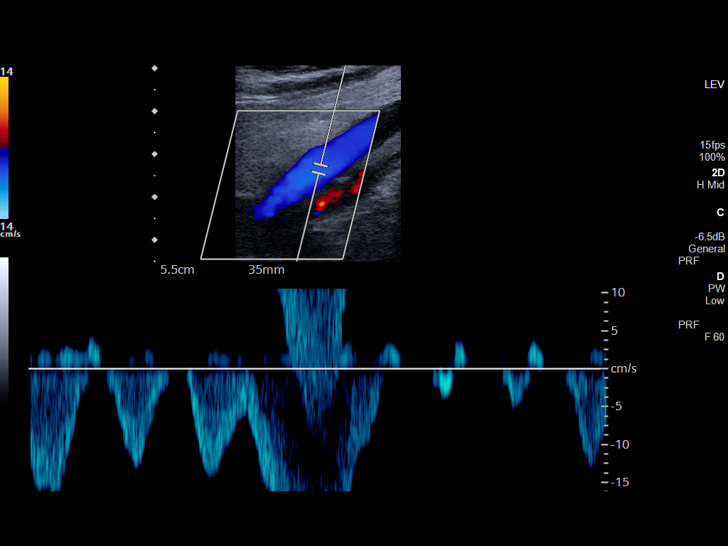
[im 28/34]
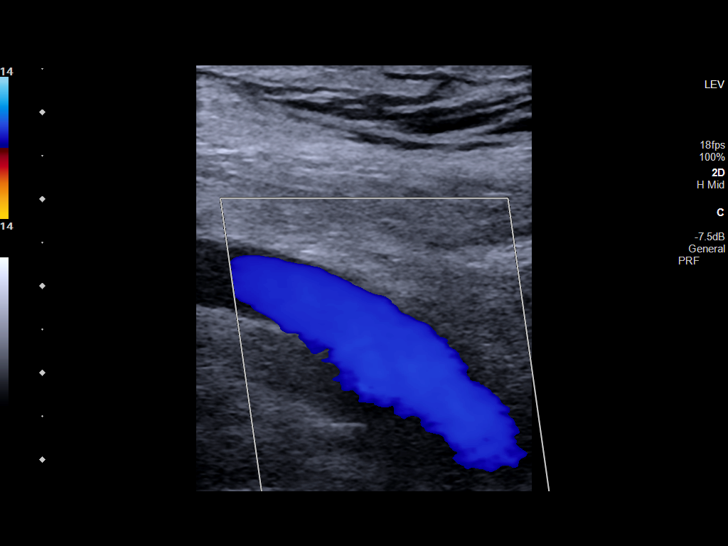
[im 31/34]
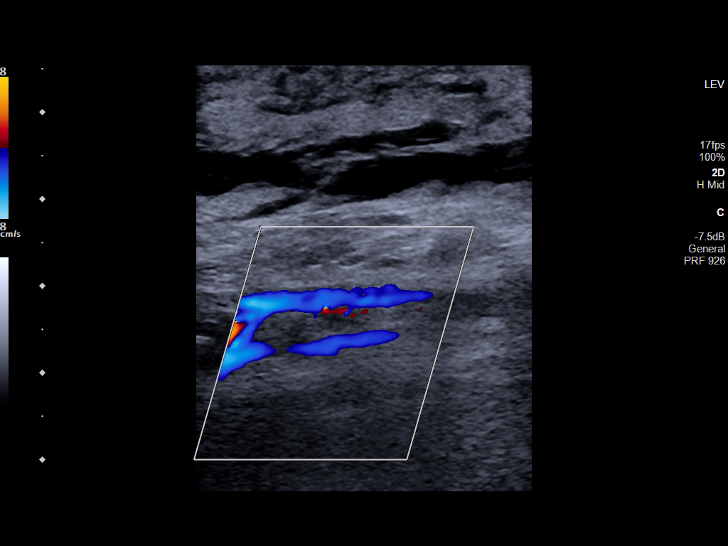
[im 34/34]
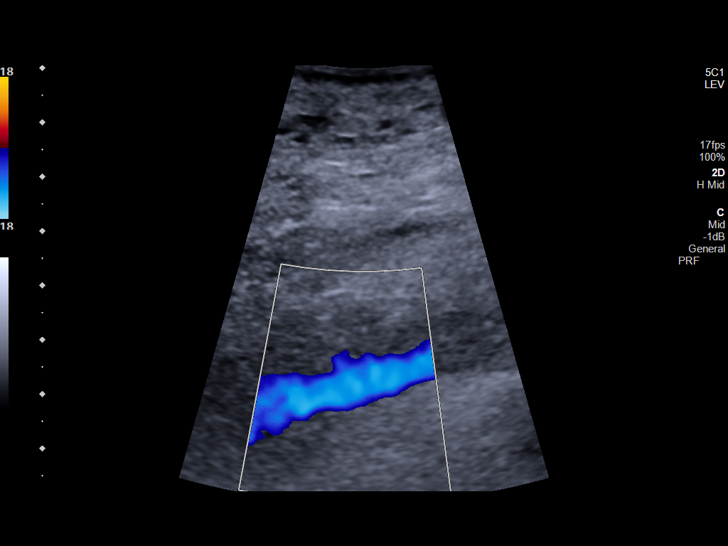

[14 of 24 positions shown; findings below may reference images not displayed]

FINDINGS: VENOUS

Normal compressibility of the common femoral, superficial femoral,
and popliteal veins, as well as the visualized calf veins.
Visualized portions of profunda femoral vein and great saphenous
vein unremarkable. No filling defects to suggest DVT on grayscale or
color Doppler imaging. Doppler waveforms show normal direction of
venous flow, normal respiratory plasticity and response to
augmentation.

Limited views of the contralateral common femoral vein are
unremarkable.

OTHER

Diffuse subcutaneous edema is identified at the level of the calf.

Limitations: none
IMPRESSION: Negative.

## 2021-02-15 IMAGING — US US EXTREM  UP VENOUS*L*
1 series · 14 of 24 positions shown · non-contrast
Comparison: None.

CLINICAL DATA: Left arm and leg swelling



[Series 1: us venous img upper uni left (dvt) · portal-venous · 14 of 31 slices shown]
[im 1/31]
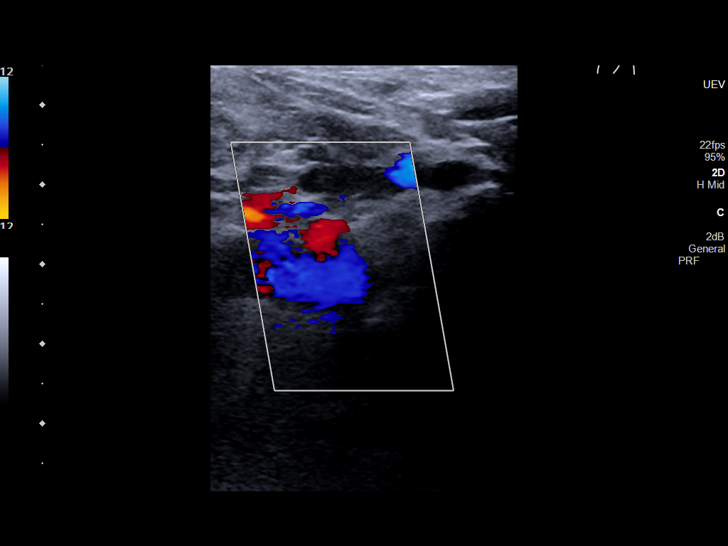
[im 3/31]
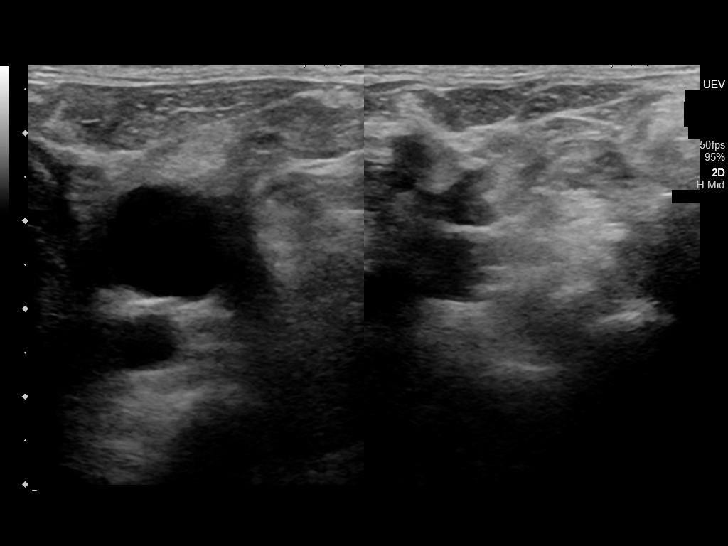
[im 6/31]
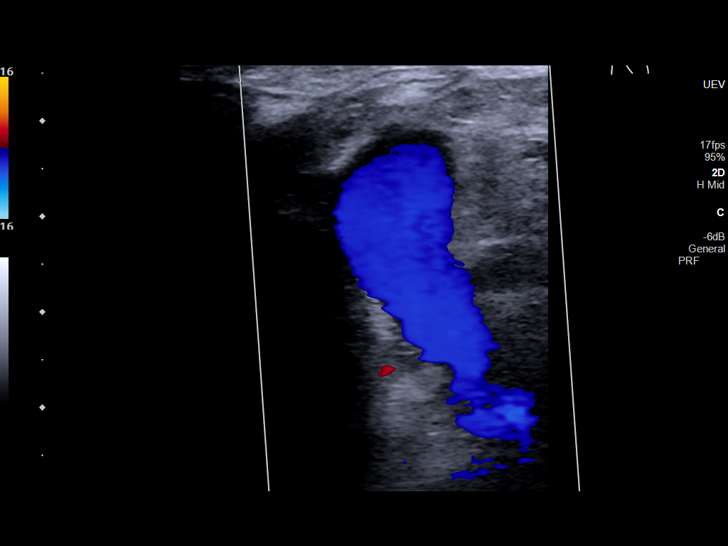
[im 8/31]
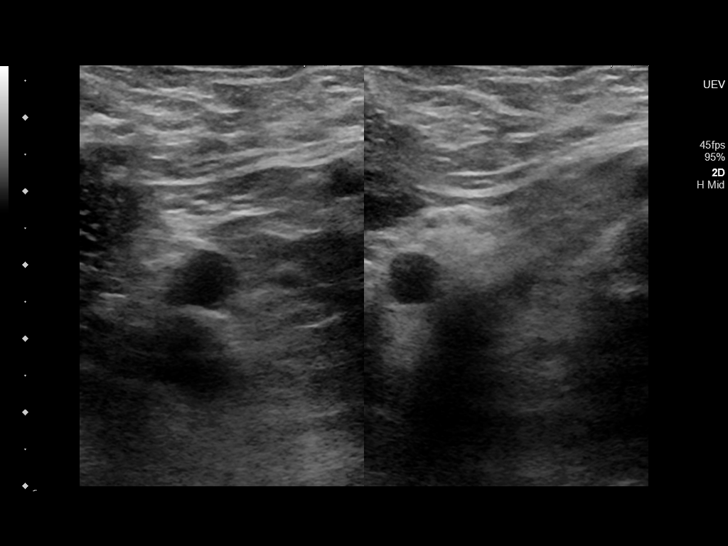
[im 10/31]
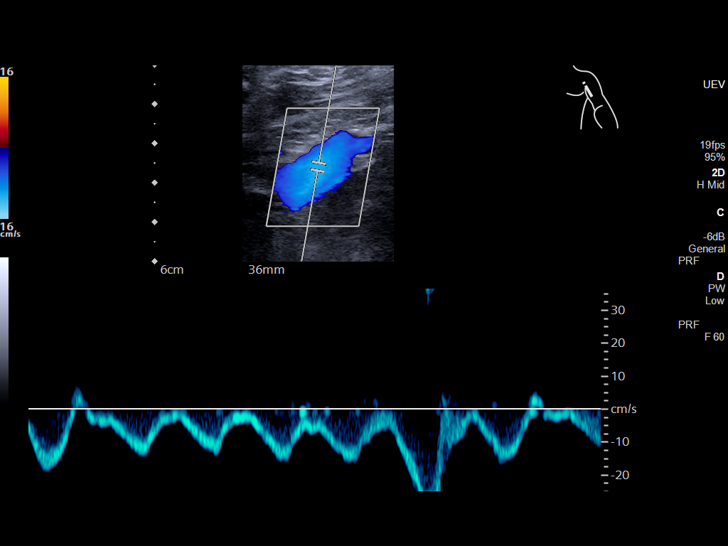
[im 12/31]
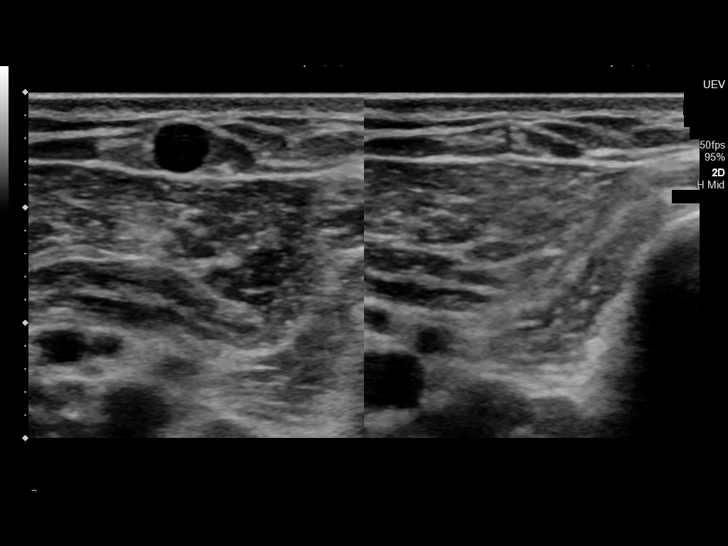
[im 15/31]
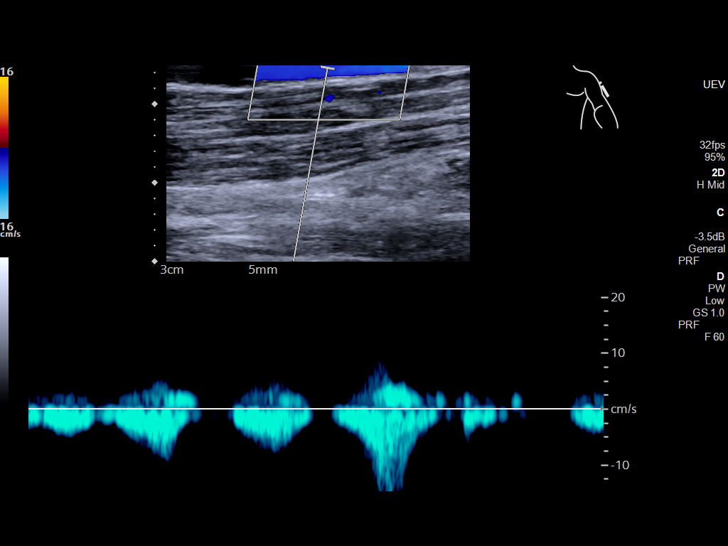
[im 16/31]
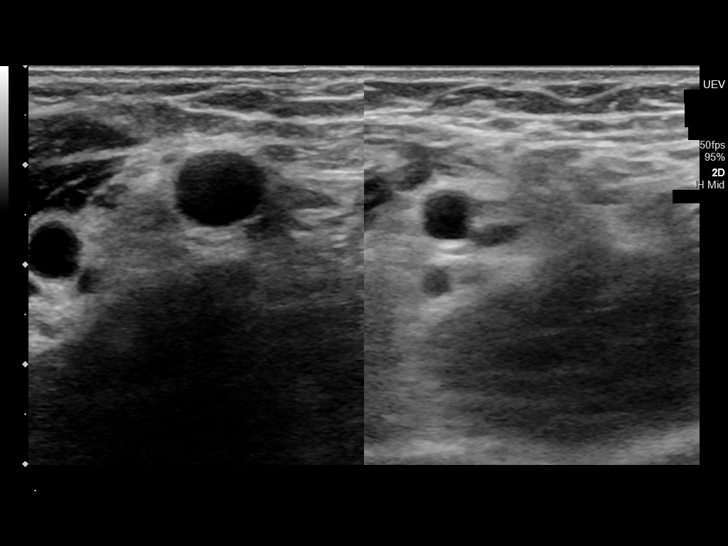
[im 19/31]
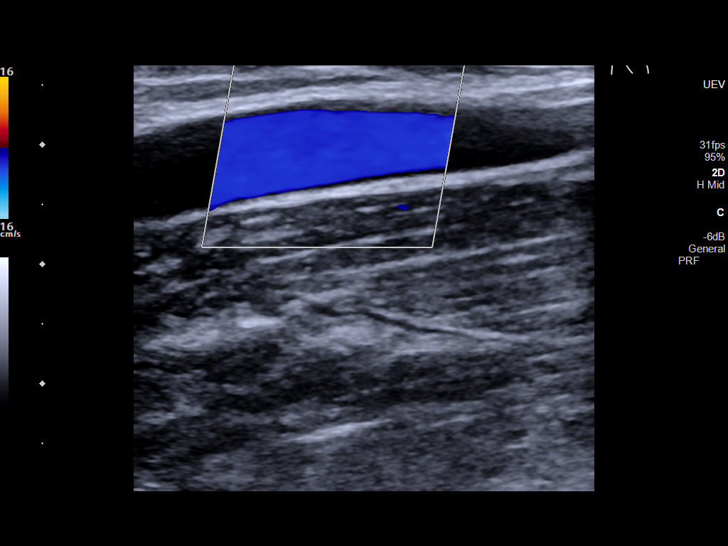
[im 21/31]
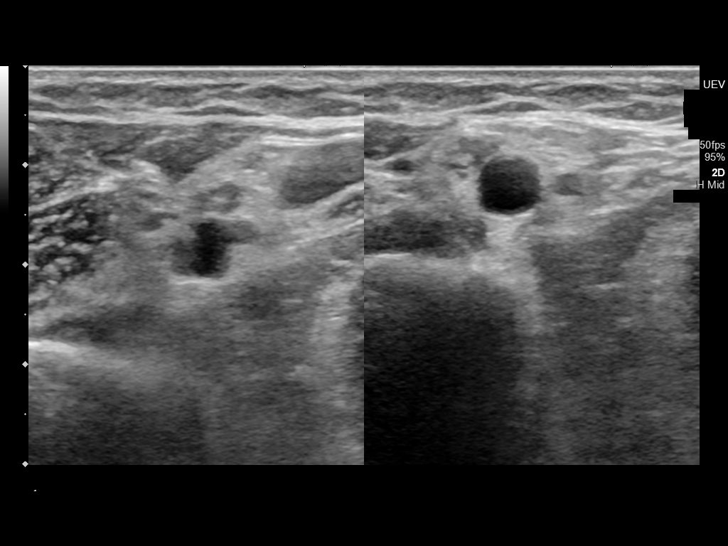
[im 24/31]
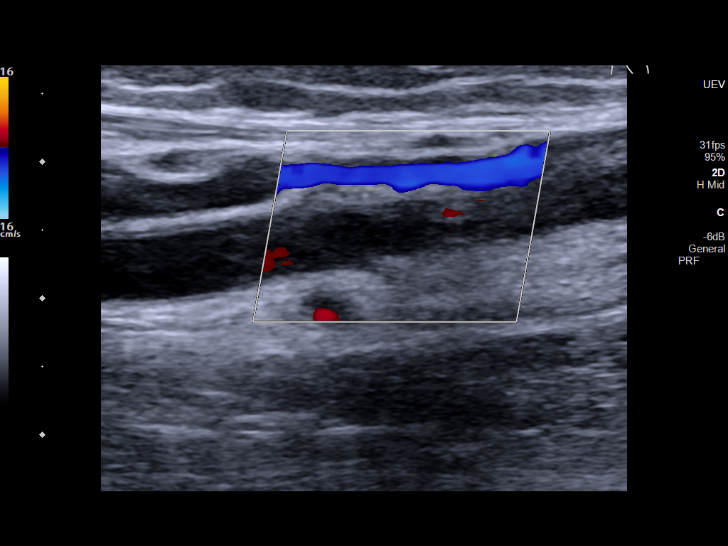
[im 25/31]
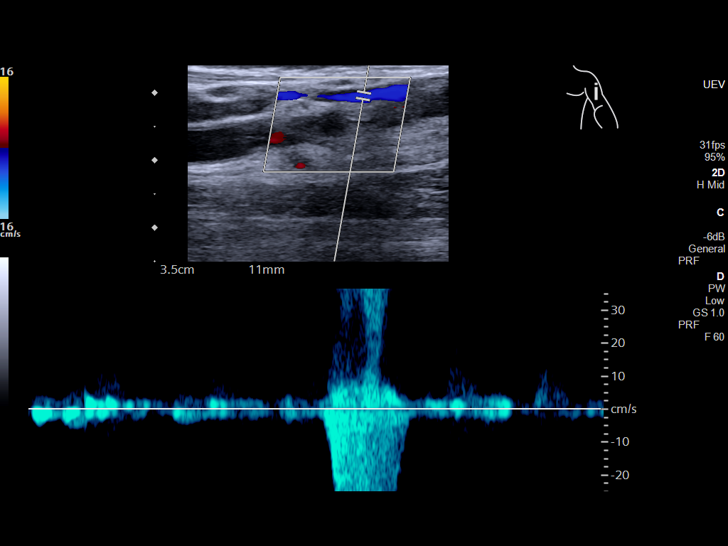
[im 28/31]
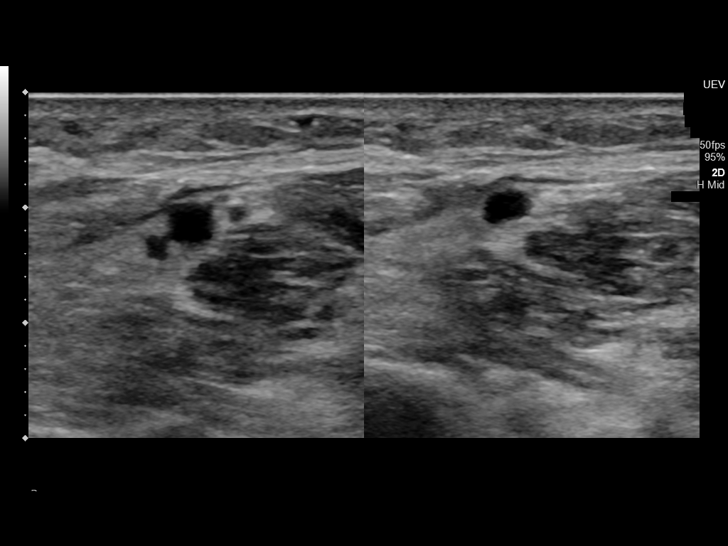
[im 31/31]
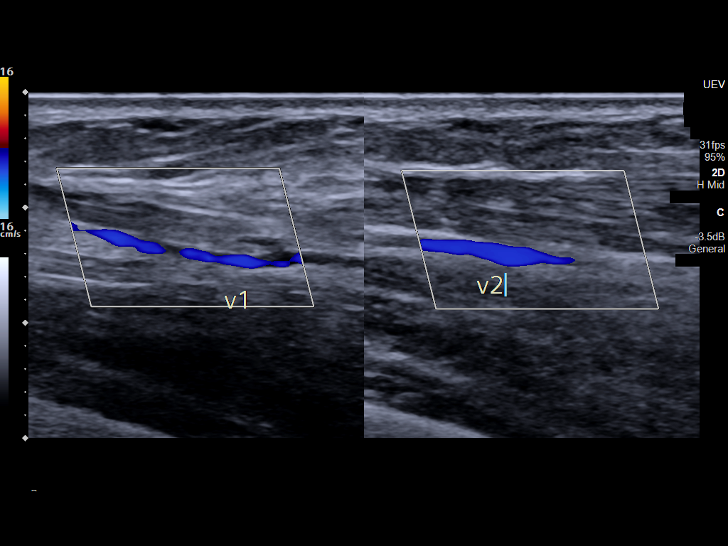

[14 of 24 positions shown; findings below may reference images not displayed]

FINDINGS: Contralateral Subclavian Vein: Respiratory phasicity is normal and
symmetric with the symptomatic side. No evidence of thrombus.

Internal Jugular Vein: No evidence of thrombus.

Subclavian Vein: No evidence of thrombus.

Axillary Vein: No evidence of thrombus.

Cephalic Vein: No evidence of thrombus.

Basilic Vein: No evidence of thrombus.

Brachial Veins: No evidence of thrombus.

Radial Veins: No evidence of thrombus.

Ulnar Veins: No evidence of thrombus.
IMPRESSION: No evidence of DVT within the left upper extremity.

## 2021-02-15 MED ORDER — PREDNISONE 10 MG (21) PO TBPK: ORAL_TABLET | ORAL | 0 refills | Status: DC

## 2021-02-15 MED ORDER — OXYCODONE-ACETAMINOPHEN 5-325 MG PO TABS
2.0000 | ORAL_TABLET | Freq: Once | ORAL | Status: AC
  Administered 2021-02-15: 2 via ORAL
  Filled 2021-02-15: qty 2

## 2021-02-15 MED ORDER — FAMOTIDINE 20 MG PO TABS: 20.0000 mg | ORAL_TABLET | Freq: Two times a day (BID) | ORAL | 0 refills | Status: DC

## 2021-02-15 NOTE — ED Triage Notes (Signed)
Emergency Medicine Provider Triage Evaluation Note  George Bolton , a 80 y.o. male  was evaluated in triage.  Pt complains of left arm pain.  He has a history of gout and said that this feels similar.  The pain was mild when he went to bed but he awoke with severe pain that is worse if he moves it.  He is having swelling to the lower part of his hand and arm which his son told the paramedics is consistent with gout flares.  However the patient said the pain is quite severe and he also has swelling in both of his legs, left greater than right.  He has a history of heart failure but he is currently denying chest pain or shortness of breath.  Review of Systems  Positive: Left arm pain and left arm swelling.  Bilateral lower extremity swelling, left greater than right. Negative: Chest pain, shortness of breath, fever, nausea, vomiting, any recent trauma history.  Physical Exam  BP (!) 142/91   Pulse (!) 116   Temp 98.1 F (36.7 C) (Oral)   Resp 20   SpO2 96%  Gen:   Awake, no distress   HEENT:  Atraumatic  Resp:  Normal effort  Cardiac:  Tachycardia.  Chronic poor perfusion in bilateral lower extremities. Abd:   Nondistended, nontender  MSK:   Chronic lymphedema bilateral lower extremity with skin changes and wounds that appear relatively chronic.  No obvious active cellulitis. Neuro:  Speech clear , no focal neurological deficits present.  Medical Decision Making  Medically screening exam initiated at about 6:30 AM.  Appropriate orders placed.  FULLER HOLZWORTH was informed that the remainder of the evaluation will be completed by another provider, this initial triage assessment does not replace that evaluation, and the importance of remaining in the ED until their evaluation is complete.  Patient is most likely suffering from a gout flare, but he is elderly with extensive chronic medical history.  He is reportedly on Xarelto.  Possibility of upper extremity blood clot should be considered  although I see no evidence that he has had DVTs in the past in the medical record.  He reports severe pain although he does not seem to be in distress but the pain is worse when he moves his hand and arm.  I ordered lab work and 2 Percocet by mouth.  He has chronic kidney disease and is likely not a good candidate for colchicine.  I will defer to the primary physician regarding administration of prednisone; reportedly he has responded well to prednisone in the past for gout but I will defer until the diagnosis is more clear.   Hinda Kehr, MD 02/15/21 [phone]

## 2021-02-15 NOTE — ED Provider Notes (Signed)
[facility] Emergency Department Provider Note  ____________________________________________  Time seen: Approximately 7:39 AM  I have reviewed the triage vital signs and the nursing notes.   HISTORY  Chief Complaint Arm Pain (Pt arrives to ED from home via ems, c/o L arm pain & swelling since yesterday at 6pm. Pt has hx of gout. )    HPI George Bolton is a 80 y.o. male with a history of diastolic heart failure, atrial fibrillation, chronic lymphedema, hypertension, gout who comes to the ED complaining of left arm pain from the elbow down that is gradual onset, constant for the last 3 days, worsening and currently severe.  No chest pain or shortness of breath.  No fever, no recent trauma.  Feels like gout, which is normally treated with prednisone for him.  Patient also reports swelling of the left leg.  It is typically bigger than the right due to his chronic lymphedema, but it seems more swollen than usual.   He measures his weight every day and says that it is stable, even over these last few days of increased swelling.  Compliant with Xarelto.  He takes furosemide as needed, and has not taken it in 3 or 4 days.  Takes metoprolol for rate control.     Past Medical History:  Diagnosis Date  . Chronic atrial fibrillation ([place])    a. Afib dates back to at least 2002 when reviewing prior EKG with EKGs from 2014 onward showing Afib; b. CHADS2VASc at least 4 (CHF, HTN, age x 2)  . Chronic diastolic CHF (congestive heart failure) ([place])    a. TTE 5/19: EF 70-75%, mod LVH, near complete obliteration of the mid and apical LV cavity (can be seen in apical hypertrophic CM), mildly dilated LA, RV cavity size nl, RV wall thickness nl, RVSF mildly reduced, mildly dilated RA; b. 02/2018 Limited echo w/ definity: EF 50-65%, no apical hypertrophic CM.  Marland Kitchen Gout   . Hypertension      Patient Active Problem List   Diagnosis Date Noted  . Chronic diastolic congestive heart  failure ([place]) 12/01/2018  . Atrial fibrillation ([place]) 03/15/2018  . Lymphedema 03/15/2018  . Gout 01/25/2017  . CHF (congestive heart failure) ([place]) 04/25/2015  . Hypertension 04/25/2015     Past Surgical History:  Procedure Laterality Date  . CHOLECYSTECTOMY N/A 01/13/2017   Procedure: LAPAROSCOPIC CHOLECYSTECTOMY;  Surgeon: Jules Husbands, MD;  Location: [facility] ORS;  Service: General;  Laterality: N/A;     Prior to Admission medications   Medication Sig Start Date End Date Taking? Authorizing Provider  famotidine (PEPCID) 20 MG tablet Take 1 tablet (20 mg total) by mouth 2 (two) times daily. 02/15/21  Yes Carrie Mew, MD  predniSONE (STERAPRED UNI-PAK 21 TAB) 10 MG (21) TBPK tablet 6 tablets on day 1, then 5 tablets on day 2, then 4 tablets on day 3, then 3 tablets on day 4, then 2 tablets on day 5, then 1 tablet on day 6. 02/15/21  Yes Carrie Mew, MD  apixaban (ELIQUIS) 2.5 MG TABS tablet Take by mouth. 12/27/19   [provider]  aspirin 325 MG tablet Take 325 mg by mouth daily.    [provider]  atorvastatin (LIPITOR) 40 MG tablet Take 1 tablet by mouth daily.    [provider]  cefadroxil (DURICEF) 500 MG capsule Take 500 mg by mouth 2 (two) times daily. 05/31/20   [provider]  cloNIDine (CATAPRES) 0.1 MG tablet Take 1 tablet  by mouth 2 (two) times daily. 05/28/20   [provider]  colchicine 0.6 MG tablet Take by mouth. 12/27/19   [provider]  felodipine (PLENDIL) 10 MG 24 hr tablet Take 10 mg by mouth daily.    [provider]  finasteride (PROSCAR) 5 MG tablet Take 1 tablet (5 mg total) by mouth daily. 08/21/20   Zara Council A, PA-C  furosemide (LASIX) 40 MG tablet Take 1 tablet (40 mg total) by mouth daily. 04/24/18   Hillary Bow, MD  hydrALAZINE (APRESOLINE) 25 MG tablet Take 1 tablet (25 mg total) by mouth 3 (three) times daily. 09/01/18 08/16/19  Alisa Graff, FNP   HYDROcodone-acetaminophen (NORCO/VICODIN) 5-325 MG tablet Take one tablet at night for pain; may take up to every 6 hours as needed for pain if not working or driving [id number]   [provider]  isosorbide mononitrate (IMDUR) 30 MG 24 hr tablet Take by mouth. 12/27/19 12/26/20  [provider]  metoprolol tartrate (LOPRESSOR) 50 MG tablet Take 1 tablet (50 mg total) by mouth 2 (two) times daily. 08/12/18   Alisa Graff, FNP  NYSTATIN powder APPLY  POWDER TOPICALLY TWICE DAILY 09/26/19   Zara Council A, PA-C  tamsulosin (FLOMAX) 0.4 MG CAPS capsule Take 1 capsule (0.4 mg total) by mouth daily. 08/21/20   Zara Council A, PA-C     Allergies Cortisone   Family History  Problem Relation Age of Onset  . Stroke Mother     Social History Social History   Tobacco Use  . Smoking status: Never Smoker  . Smokeless tobacco: Never Used  Substance Use Topics  . Alcohol use: No  . Drug use: No    Review of Systems  Constitutional:   No fever or chills.  ENT:   No sore throat. No rhinorrhea. Cardiovascular:   No chest pain or syncope. Respiratory:   No dyspnea or cough. Gastrointestinal:   Negative for abdominal pain, vomiting and diarrhea.  Musculoskeletal: Left arm pain and swelling.  Left leg swelling All other systems reviewed and are negative except as documented above in ROS and HPI.  ____________________________________________   PHYSICAL EXAM:  VITAL SIGNS: ED Triage Vitals  Enc Vitals Group     BP 02/15/21 0624 (!) 152/102     Pulse Rate 02/15/21 0624 (!) 109     Resp 02/15/21 0624 20     Temp 02/15/21 0624 98.1 F (36.7 C)     Temp Source 02/15/21 0624 Oral     SpO2 02/15/21 0624 95 %     Weight --      Height --      Head Circumference --      Peak Flow --      Pain Score 02/15/21 0628 10     Pain Loc --      Pain Edu? --      Excl. in [place]? --     Vital signs reviewed, nursing assessments reviewed.   Constitutional:   Alert and  oriented. Non-toxic appearance. Eyes:   Conjunctivae are normal. EOMI. PERRL. ENT      Head:   Normocephalic and atraumatic.      Nose:   Wearing a mask.      Mouth/Throat:   Wearing a mask.      Neck:   No meningismus. Full ROM. Hematological/Lymphatic/Immunilogical:   No cervical lymphadenopathy. Cardiovascular:   Irregularly irregular rhythm, heart rate 110. Symmetric bilateral radial and DP pulses.  No murmurs. Cap  refill less than 2 seconds. Respiratory:   Normal respiratory effort without tachypnea/retractions. Breath sounds are clear and equal bilaterally. No wheezes/rales/rhonchi. Gastrointestinal:   Soft and nontender. Non distended. There is no CVA tenderness.  No rebound, rigidity, or guarding. Genitourinary:   deferred Musculoskeletal:   Diffuse tenderness and warmth in the left arm from mid humerus distally.  No focal joint tenderness.  Able to demonstrate range of motion.  No erythema or induration.  No fluctuance.  No crepitus.  No wounds.  Tophaceous deformity of MCP and PIP joints of bilateral hands.  Chronic lymphedema changes of bilateral lower extremities, left leg calf circumference greater than right.  Neurologic:   Normal speech and language.  Motor grossly intact. No acute focal neurologic deficits are appreciated.  Skin:    Skin is warm, dry and intact. No rash noted.  No petechiae, purpura, or bullae.  ____________________________________________    LABS (pertinent positives/negatives) (all labs ordered are listed, but only abnormal results are displayed) Labs Reviewed  CBC WITH DIFFERENTIAL/PLATELET - Abnormal; Notable for the following components:      Result Value   Hemoglobin 12.8 (*)    All other components within normal limits  SEDIMENTATION RATE - Abnormal; Notable for the following components:   Sed Rate 53 (*)    All other components within normal limits  BASIC METABOLIC PANEL - Abnormal; Notable for the following components:   Potassium 3.4 (*)     CO2 19 (*)    Glucose, Bld 124 (*)    Creatinine, Ser 1.70 (*)    GFR, Estimated 40 (*)    All other components within normal limits  URIC ACID   ____________________________________________   EKG    ____________________________________________    RADIOLOGY  US Venous Img Lower Unilateral Left  Result Date: 02/15/2021 CLINICAL DATA:  Left leg swelling EXAM: Left LOWER EXTREMITY VENOUS DOPPLER ULTRASOUND TECHNIQUE: Gray-scale sonography with compression, as well as color and duplex ultrasound, were performed to evaluate the deep venous system(s) from the level of the common femoral vein through the popliteal and proximal calf veins. COMPARISON:  None. FINDINGS: VENOUS Normal compressibility of the common femoral, superficial femoral, and popliteal veins, as well as the visualized calf veins. Visualized portions of profunda femoral vein and great saphenous vein unremarkable. No filling defects to suggest DVT on grayscale or color Doppler imaging. Doppler waveforms show normal direction of venous flow, normal respiratory plasticity and response to augmentation. Limited views of the contralateral common femoral vein are unremarkable. OTHER Diffuse subcutaneous edema is identified at the level of the calf. Limitations: none IMPRESSION: Negative. Electronically Signed   By: Kerby Moors M.D.   On: 02/15/2021 09:07   US Venous Img Upper Uni Left  Result Date: 02/15/2021 CLINICAL DATA:  Left arm and leg swelling EXAM: left UPPER EXTREMITY VENOUS DOPPLER ULTRASOUND TECHNIQUE: Gray-scale sonography with graded compression, as well as color Doppler and duplex ultrasound were performed to evaluate the upper extremity deep venous system from the level of the subclavian vein and including the jugular, axillary, basilic, radial, ulnar and upper cephalic vein. Spectral Doppler was utilized to evaluate flow at rest and with distal augmentation maneuvers. COMPARISON:  None. FINDINGS: Contralateral  Subclavian Vein: Respiratory phasicity is normal and symmetric with the symptomatic side. No evidence of thrombus. Internal Jugular Vein: No evidence of thrombus. Subclavian Vein: No evidence of thrombus. Axillary Vein: No evidence of thrombus. Cephalic Vein: No evidence of thrombus. Basilic Vein: No evidence of thrombus. Brachial Veins: No evidence  of thrombus. Radial Veins: No evidence of thrombus. Ulnar Veins: No evidence of thrombus. IMPRESSION: No evidence of DVT within the left upper extremity. Electronically Signed   By: Monte Fantasia M.D.   On: 02/15/2021 09:08    ____________________________________________   PROCEDURES Procedures  ____________________________________________    CLINICAL IMPRESSION / ASSESSMENT AND PLAN / ED COURSE  Medications ordered in the ED: Medications  oxyCODONE-acetaminophen (PERCOCET/ROXICET) 5-325 MG per tablet 2 tablet (2 tablets Oral Given 02/15/21 0700)    Pertinent labs & imaging results that were available during my care of the patient were reviewed by me and considered in my medical decision making (see chart for details).  George Bolton was evaluated in Emergency Department on 02/15/2021 for the symptoms described in the history of present illness. He was evaluated in the context of the global COVID-19 pandemic, which necessitated consideration that the patient might be at risk for infection with the SARS-CoV-2 virus that causes COVID-19. Institutional protocols and algorithms that pertain to the evaluation of patients at risk for COVID-19 are in a state of rapid change based on information released by regulatory bodies including the CDC and federal and state organizations. These policies and algorithms were followed during the patient's care in the ED.   Patient presents with left arm pain and swelling.  He reports it feels similar to previous experiences with gout flare, and I suspect it is due to gout.  Doubt cellulitis or other musculoskeletal  infection.  Possible DVT or mild volume overload related to CKD and CHF since he has not used his furosemide lately.  Labs overall unremarkable.  Will obtain ultrasound of left arm and left leg, plan to treat with prednisone and encourage furosemide if ultrasound is negative.   ----------------------------------------- 12:48 PM on 02/15/2021 -----------------------------------------  Ultrasounds negative.  Vitals remained stable, A. fib rate controlled.  Will discharge with prescription for prednisone as well as Pepcid for GI prophylaxis.     ____________________________________________   FINAL CLINICAL IMPRESSION(S) / ED DIAGNOSES    Final diagnoses:  Acute gout of left elbow, unspecified cause  Lymphedema of both lower extremities  Permanent atrial fibrillation [facility])     ED Discharge Orders         Ordered    predniSONE (STERAPRED UNI-PAK 21 TAB) 10 MG (21) TBPK tablet        02/15/21 1247    famotidine (PEPCID) 20 MG tablet  2 times daily        02/15/21 1247          Portions of this note were generated with dragon dictation software. Dictation errors may occur despite best attempts at proofreading.   Carrie Mew, MD 02/15/21 1249

## 2021-02-15 NOTE — Discharge Instructions (Signed)
Your ultrasounds are negative for blood clots.  Please take steroids for gout flare and follow-up with your doctor this week for reassessment to ensure that you are improving.

## 2021-02-15 NOTE — ED Notes (Signed)
Pt family says they are on the way back to take him home

## 2021-05-21 ENCOUNTER — Observation Stay: Payer: Medicare Other

## 2021-05-21 ENCOUNTER — Emergency Department: Payer: Medicare Other

## 2021-05-21 ENCOUNTER — Inpatient Hospital Stay
Admission: EM | Admit: 2021-05-21 | Discharge: 2021-05-24 | DRG: 603 | Disposition: A | Discharge status: Home / Self Care | Payer: Medicare Other | Attending: Internal Medicine | Admitting: Internal Medicine

## 2021-05-21 DIAGNOSIS — I2510 Atherosclerotic heart disease of native coronary artery without angina pectoris: Secondary | ICD-10-CM | POA: Diagnosis present

## 2021-05-21 DIAGNOSIS — Z20822 Contact with and (suspected) exposure to covid-19: Secondary | ICD-10-CM | POA: Diagnosis present

## 2021-05-21 DIAGNOSIS — L0390 Cellulitis, unspecified: Secondary | ICD-10-CM | POA: Diagnosis not present

## 2021-05-21 DIAGNOSIS — I5032 Chronic diastolic (congestive) heart failure: Secondary | ICD-10-CM | POA: Diagnosis not present

## 2021-05-21 DIAGNOSIS — Z888 Allergy status to other drugs, medicaments and biological substances status: Secondary | ICD-10-CM

## 2021-05-21 DIAGNOSIS — N1832 Chronic kidney disease, stage 3b: Secondary | ICD-10-CM | POA: Diagnosis present

## 2021-05-21 DIAGNOSIS — I493 Ventricular premature depolarization: Secondary | ICD-10-CM | POA: Diagnosis present

## 2021-05-21 DIAGNOSIS — L03113 Cellulitis of right upper limb: Principal | ICD-10-CM

## 2021-05-21 DIAGNOSIS — I509 Heart failure, unspecified: Secondary | ICD-10-CM

## 2021-05-21 DIAGNOSIS — Z7982 Long term (current) use of aspirin: Secondary | ICD-10-CM

## 2021-05-21 DIAGNOSIS — I4891 Unspecified atrial fibrillation: Secondary | ICD-10-CM | POA: Diagnosis not present

## 2021-05-21 DIAGNOSIS — M10041 Idiopathic gout, right hand: Secondary | ICD-10-CM

## 2021-05-21 DIAGNOSIS — Z79899 Other long term (current) drug therapy: Secondary | ICD-10-CM

## 2021-05-21 DIAGNOSIS — I5033 Acute on chronic diastolic (congestive) heart failure: Secondary | ICD-10-CM | POA: Diagnosis present

## 2021-05-21 DIAGNOSIS — Z7901 Long term (current) use of anticoagulants: Secondary | ICD-10-CM

## 2021-05-21 DIAGNOSIS — E785 Hyperlipidemia, unspecified: Secondary | ICD-10-CM | POA: Diagnosis present

## 2021-05-21 DIAGNOSIS — Z823 Family history of stroke: Secondary | ICD-10-CM

## 2021-05-21 DIAGNOSIS — N1831 Chronic kidney disease, stage 3a: Secondary | ICD-10-CM

## 2021-05-21 DIAGNOSIS — D72829 Elevated white blood cell count, unspecified: Secondary | ICD-10-CM | POA: Diagnosis present

## 2021-05-21 DIAGNOSIS — I130 Hypertensive heart and chronic kidney disease with heart failure and stage 1 through stage 4 chronic kidney disease, or unspecified chronic kidney disease: Secondary | ICD-10-CM | POA: Diagnosis present

## 2021-05-21 DIAGNOSIS — M1A9XX1 Chronic gout, unspecified, with tophus (tophi): Secondary | ICD-10-CM | POA: Diagnosis present

## 2021-05-21 DIAGNOSIS — N183 Chronic kidney disease, stage 3 unspecified: Secondary | ICD-10-CM

## 2021-05-21 DIAGNOSIS — N400 Enlarged prostate without lower urinary tract symptoms: Secondary | ICD-10-CM | POA: Diagnosis present

## 2021-05-21 DIAGNOSIS — M109 Gout, unspecified: Secondary | ICD-10-CM | POA: Diagnosis present

## 2021-05-21 DIAGNOSIS — I10 Essential (primary) hypertension: Secondary | ICD-10-CM

## 2021-05-21 DIAGNOSIS — I472 Ventricular tachycardia: Secondary | ICD-10-CM | POA: Diagnosis present

## 2021-05-21 DIAGNOSIS — M79641 Pain in right hand: Secondary | ICD-10-CM

## 2021-05-21 DIAGNOSIS — A419 Sepsis, unspecified organism: Secondary | ICD-10-CM

## 2021-05-21 DIAGNOSIS — K219 Gastro-esophageal reflux disease without esophagitis: Secondary | ICD-10-CM | POA: Diagnosis present

## 2021-05-21 DIAGNOSIS — Z9119 Patient's noncompliance with other medical treatment and regimen: Secondary | ICD-10-CM

## 2021-05-21 DIAGNOSIS — I482 Chronic atrial fibrillation, unspecified: Secondary | ICD-10-CM | POA: Diagnosis present

## 2021-05-21 DIAGNOSIS — Z9049 Acquired absence of other specified parts of digestive tract: Secondary | ICD-10-CM

## 2021-05-21 LAB — LACTIC ACID, PLASMA: Lactic Acid, Venous: 1 mmol/L (ref 0.5–1.9)

## 2021-05-21 LAB — URINALYSIS, COMPLETE (UACMP) WITH MICROSCOPIC
Bilirubin Urine: NEGATIVE
Glucose, UA: NEGATIVE mg/dL
Ketones, ur: NEGATIVE mg/dL
Nitrite: NEGATIVE
Protein, ur: >=300 mg/dL — AB
Specific Gravity, Urine: 1.019 (ref 1.005–1.030)
pH: 5 (ref 5.0–8.0)

## 2021-05-21 LAB — CBC WITH DIFFERENTIAL/PLATELET
Abs Immature Granulocytes: 0.06 10*3/uL (ref 0.00–0.07)
Basophils Absolute: 0 10*3/uL (ref 0.0–0.1)
Basophils Relative: 0 %
Eosinophils Absolute: 0 10*3/uL (ref 0.0–0.5)
Eosinophils Relative: 0 %
HCT: 39 % (ref 39.0–52.0)
Hemoglobin: 13 g/dL (ref 13.0–17.0)
Immature Granulocytes: 0 %
Lymphocytes Relative: 8 %
Lymphs Abs: 1.2 10*3/uL (ref 0.7–4.0)
MCH: 27.7 pg (ref 26.0–34.0)
MCHC: 33.3 g/dL (ref 30.0–36.0)
MCV: 83.2 fL (ref 80.0–100.0)
Monocytes Absolute: 1.7 10*3/uL — ABNORMAL HIGH (ref 0.1–1.0)
Monocytes Relative: 11 %
Neutro Abs: 11.7 10*3/uL — ABNORMAL HIGH (ref 1.7–7.7)
Neutrophils Relative %: 81 %
Platelets: 270 10*3/uL (ref 150–400)
RBC: 4.69 MIL/uL (ref 4.22–5.81)
RDW: 16.3 % — ABNORMAL HIGH (ref 11.5–15.5)
WBC: 14.8 10*3/uL — ABNORMAL HIGH (ref 4.0–10.5)
nRBC: 0 % (ref 0.0–0.2)

## 2021-05-21 LAB — COMPREHENSIVE METABOLIC PANEL
ALT: 8 U/L (ref 0–44)
AST: 15 U/L (ref 15–41)
Albumin: 3.1 g/dL — ABNORMAL LOW (ref 3.5–5.0)
Alkaline Phosphatase: 64 U/L (ref 38–126)
Anion gap: 8 (ref 5–15)
BUN: 30 mg/dL — ABNORMAL HIGH (ref 8–23)
CO2: 20 mmol/L — ABNORMAL LOW (ref 22–32)
Calcium: 9.5 mg/dL (ref 8.9–10.3)
Chloride: 111 mmol/L (ref 98–111)
Creatinine, Ser: 1.6 mg/dL — ABNORMAL HIGH (ref 0.61–1.24)
GFR, Estimated: 43 mL/min — ABNORMAL LOW (ref >60)
Glucose, Bld: 125 mg/dL — ABNORMAL HIGH (ref 70–99)
Potassium: 4.2 mmol/L (ref 3.5–5.1)
Sodium: 139 mmol/L (ref 135–145)
Total Bilirubin: 1.1 mg/dL (ref 0.3–1.2)
Total Protein: 7.3 g/dL (ref 6.5–8.1)

## 2021-05-21 LAB — RESP PANEL BY RT-PCR (FLU A&B, COVID) ARPGX2
Influenza A by PCR: NEGATIVE
Influenza B by PCR: NEGATIVE
SARS Coronavirus 2 by RT PCR: NEGATIVE

## 2021-05-21 LAB — PROCALCITONIN: Procalcitonin: 0.34 ng/mL

## 2021-05-21 LAB — URIC ACID: Uric Acid, Serum: 8.1 mg/dL (ref 3.7–8.6)

## 2021-05-21 IMAGING — DX DG CHEST 1V PORT
2 series · 2 of 2 positions shown · non-contrast
Comparison: Radiograph and CT [DATE]

CLINICAL DATA: Forearm swelling

EXAM:
PORTABLE CHEST 1 VIEW

[chest ap (1 of 2)]
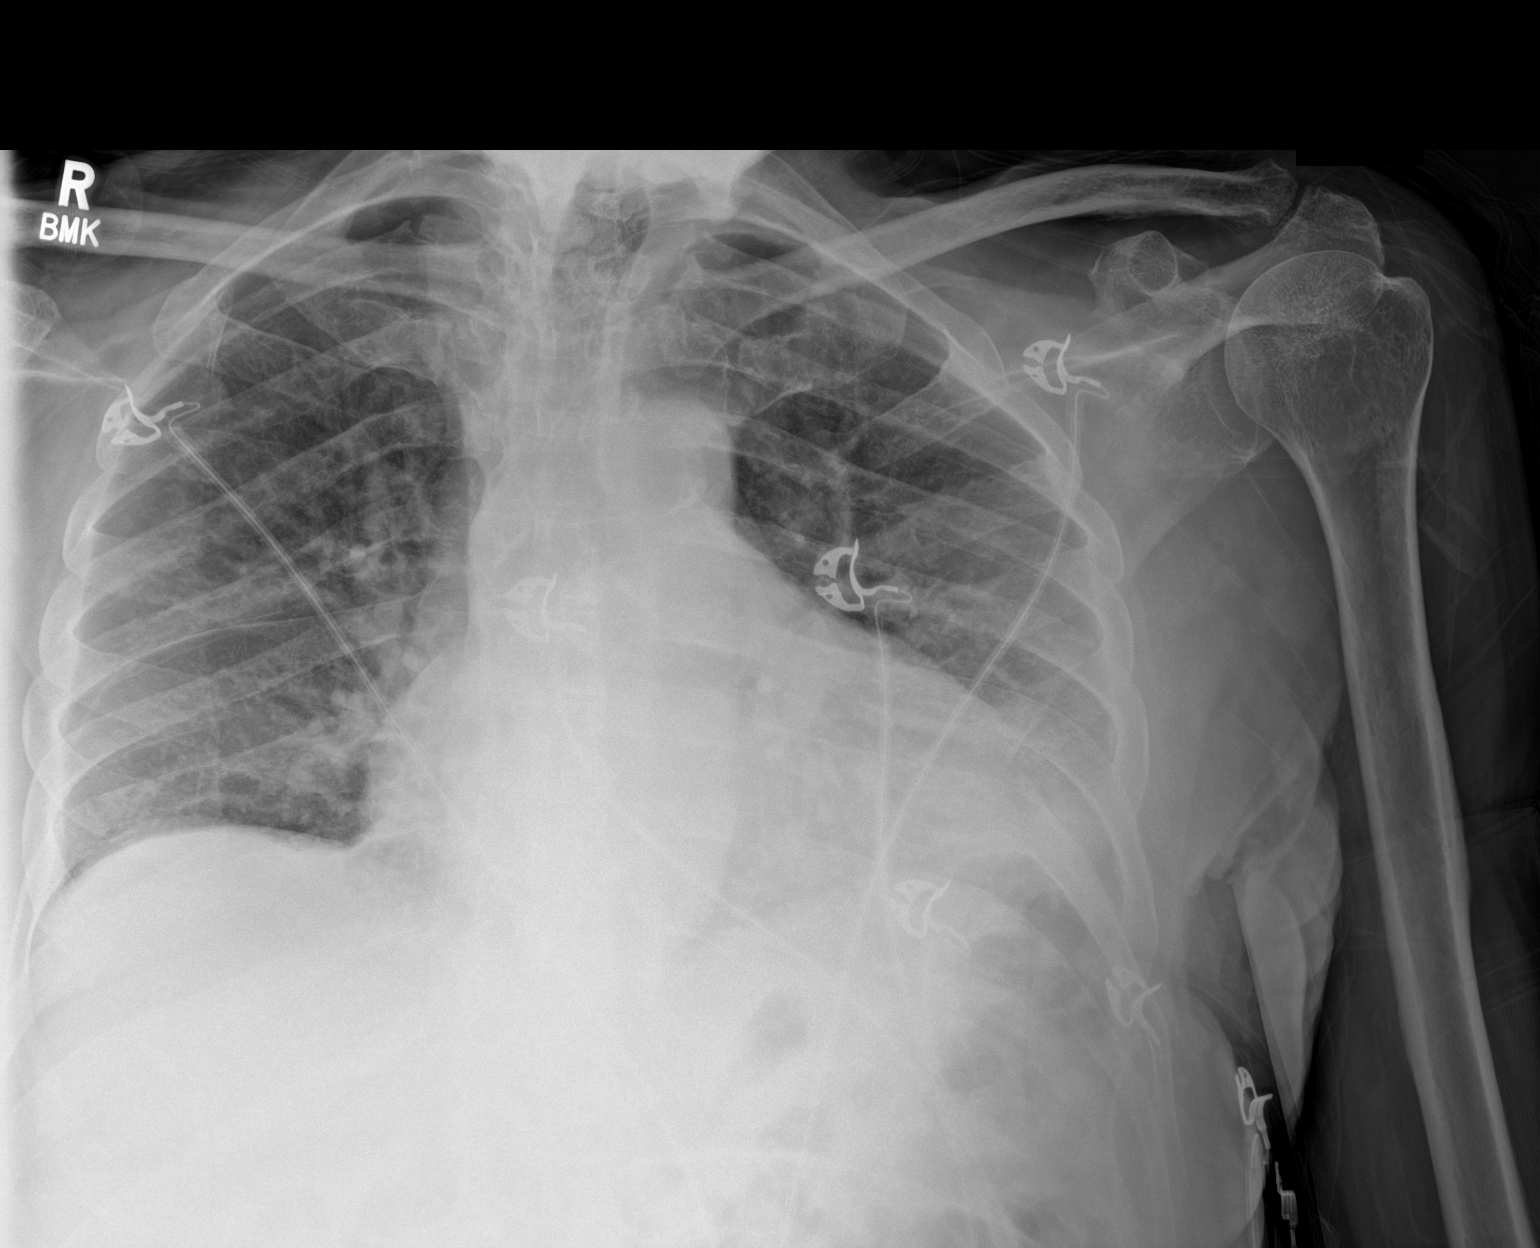

[chest ap (2 of 2)]
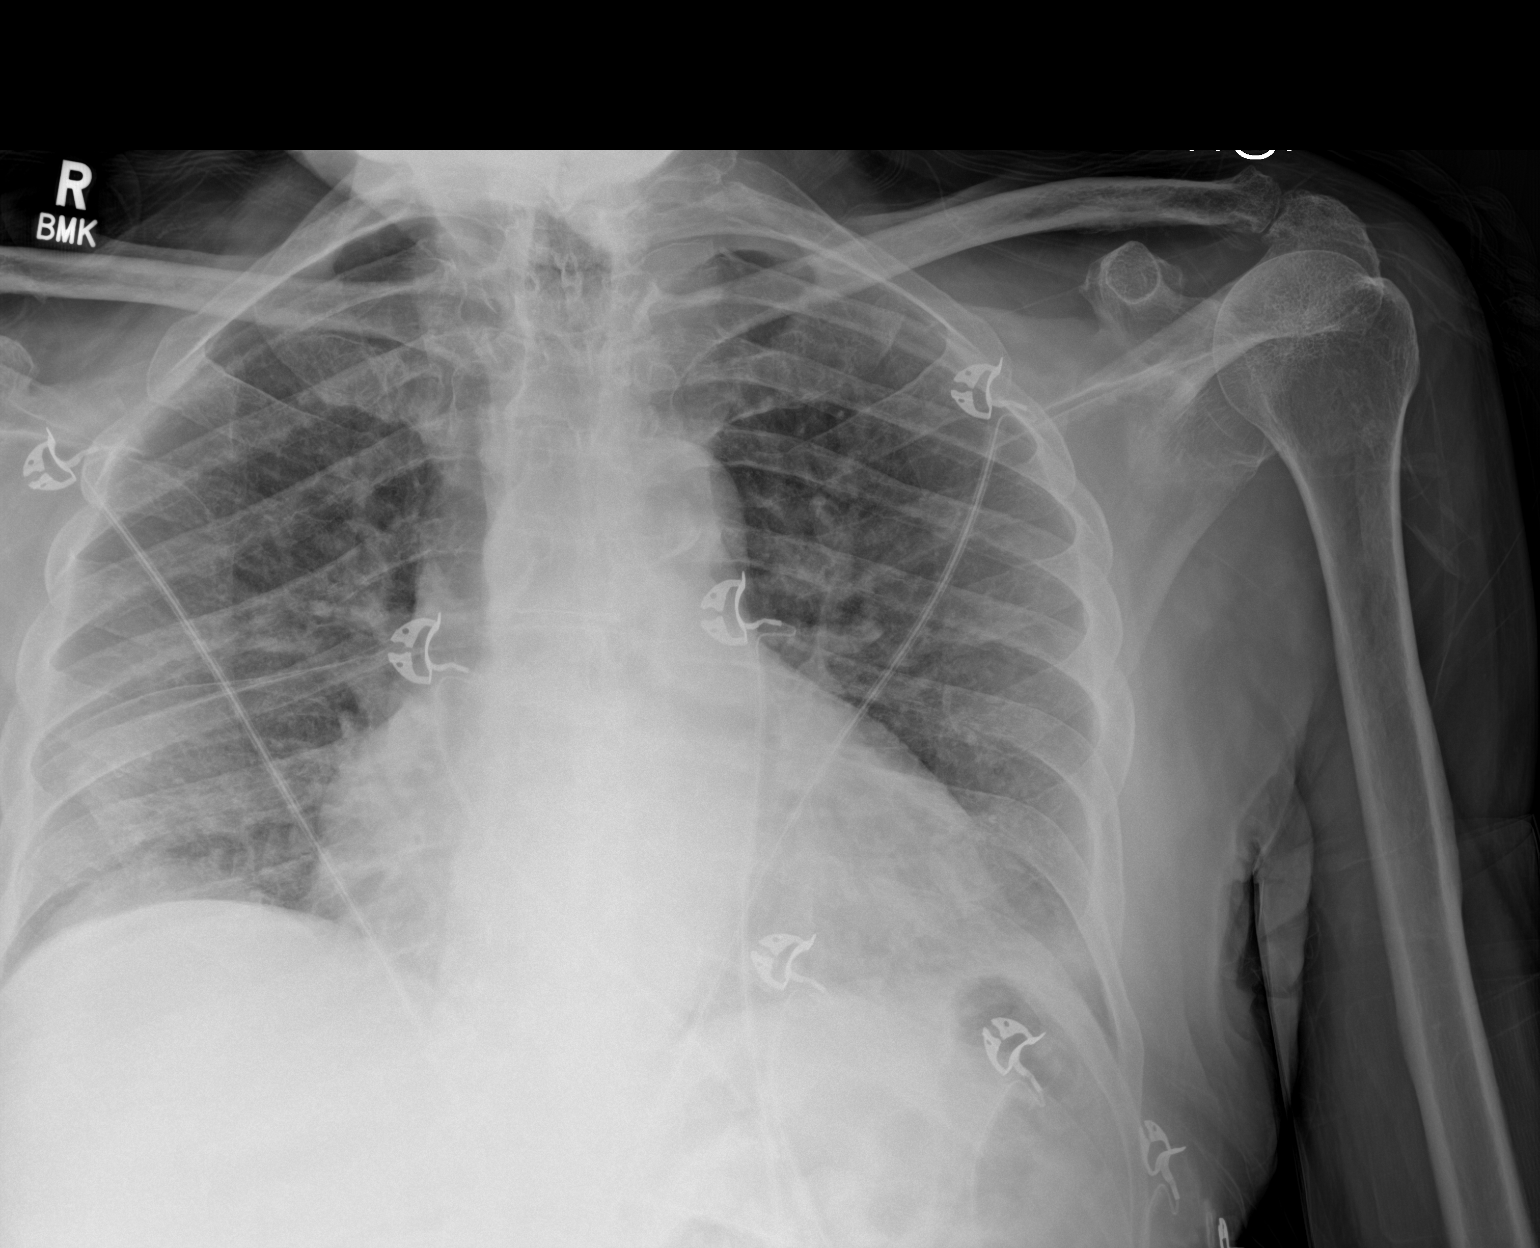

[2 of 2 positions shown; findings below may reference images not displayed]

FINDINGS: Low volumes and atelectasis with a background of chronically
coarsened interstitial and emphysematous changes which are similar
to priors. No pneumothorax, effusion, focal consolidation. Mild
vascular congestion without features of frank pulmonary edema. Hilar
adenopathy seen on comparison CT imaging is less well visualized on
this exam particular in the absence of lateral view.
Cardiomediastinal contours are stable from prior with a calcified
tortuous aorta. Degenerative changes are present in the imaged spine
and shoulders. No acute osseous abnormality or suspicious osseous
lesion.
IMPRESSION: Low volumes and atelectasis.

Mild vascular crowding without frank edema.

Background of chronically coarsened interstitial and bronchitic
changes.

Previously seen hilar adenopathy is not well visualized on these
radiographic images.

Aortic Atherosclerosis ([YC]-[YC]).

## 2021-05-21 IMAGING — DX DG ELBOW COMPLETE 3+V*R*
3 series · 3 of 3 positions shown · non-contrast
Comparison: None.

CLINICAL DATA: Right arm swelling from the right hand to the right
elbow.

EXAM:
RIGHT ELBOW - COMPLETE 3+ VIEW

[elbow ap]
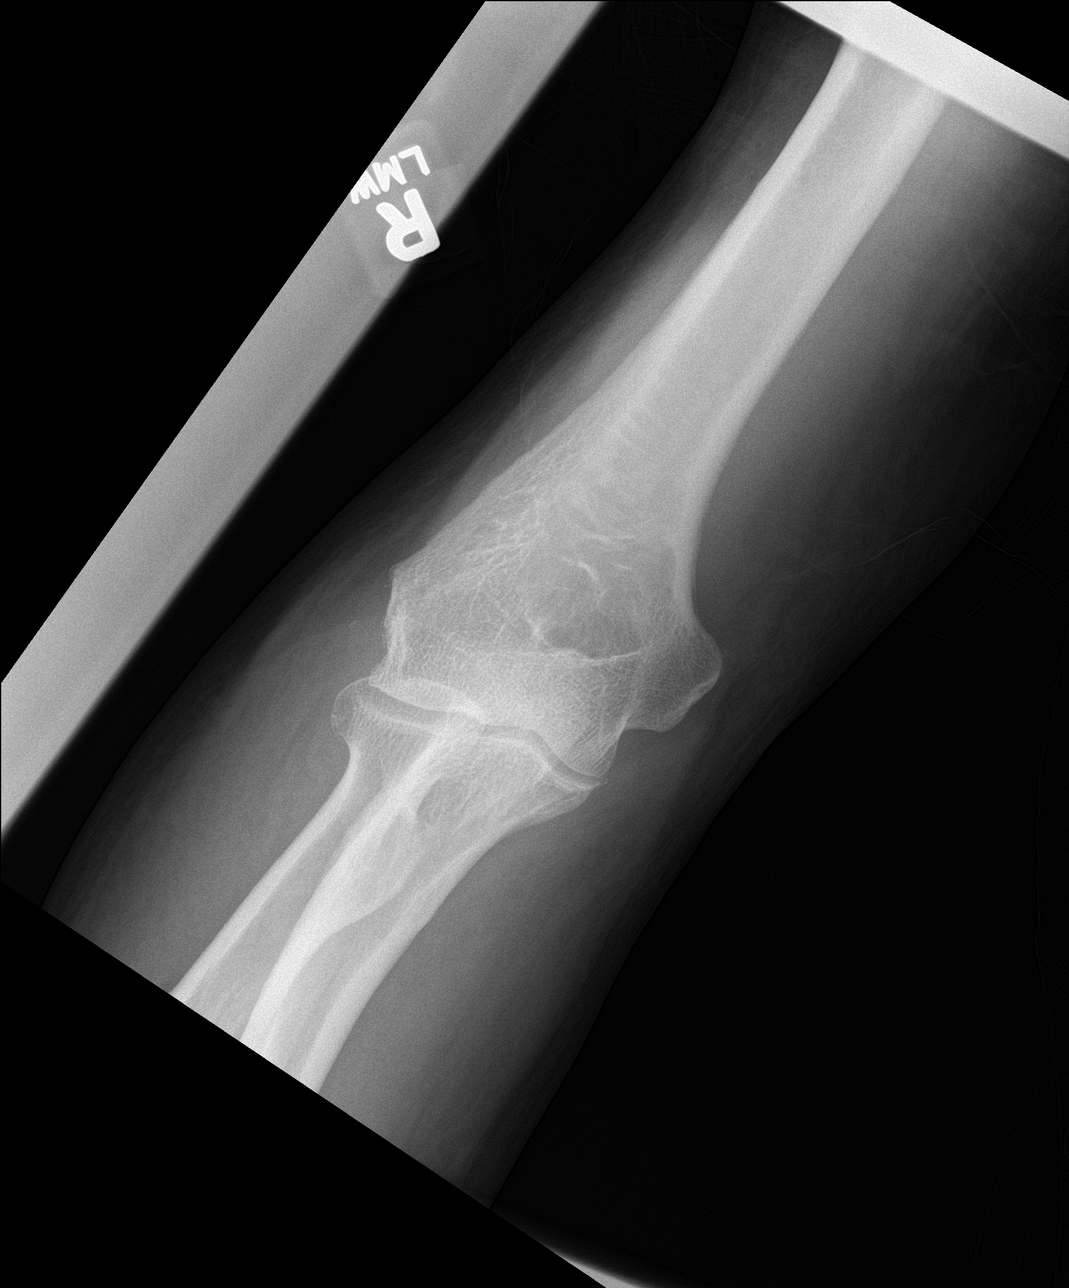

[elbow obl]
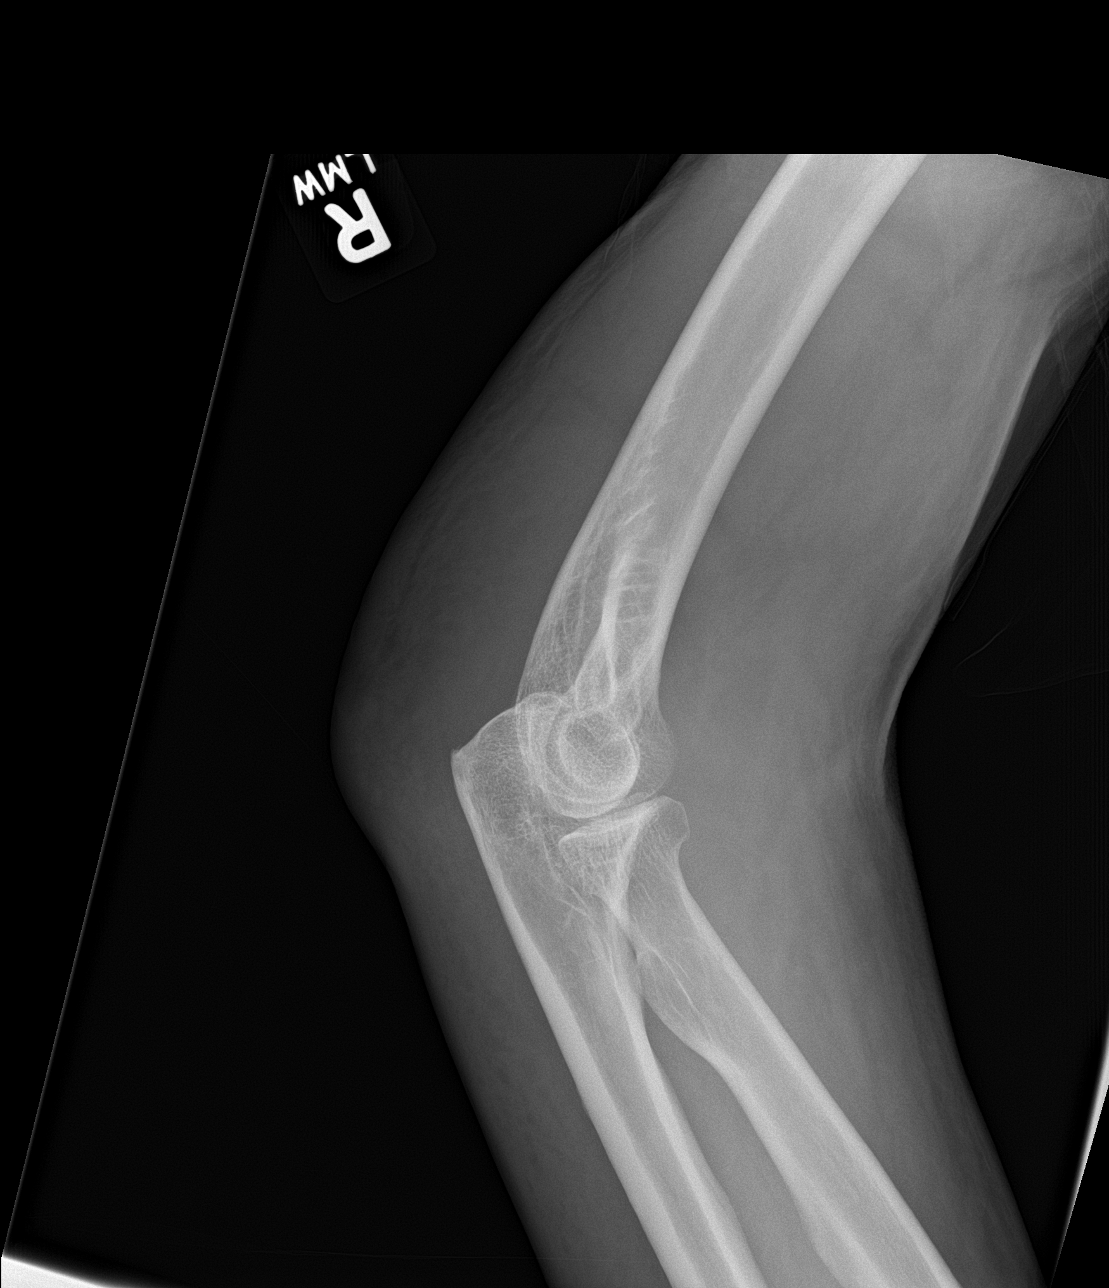

[elbow lat]
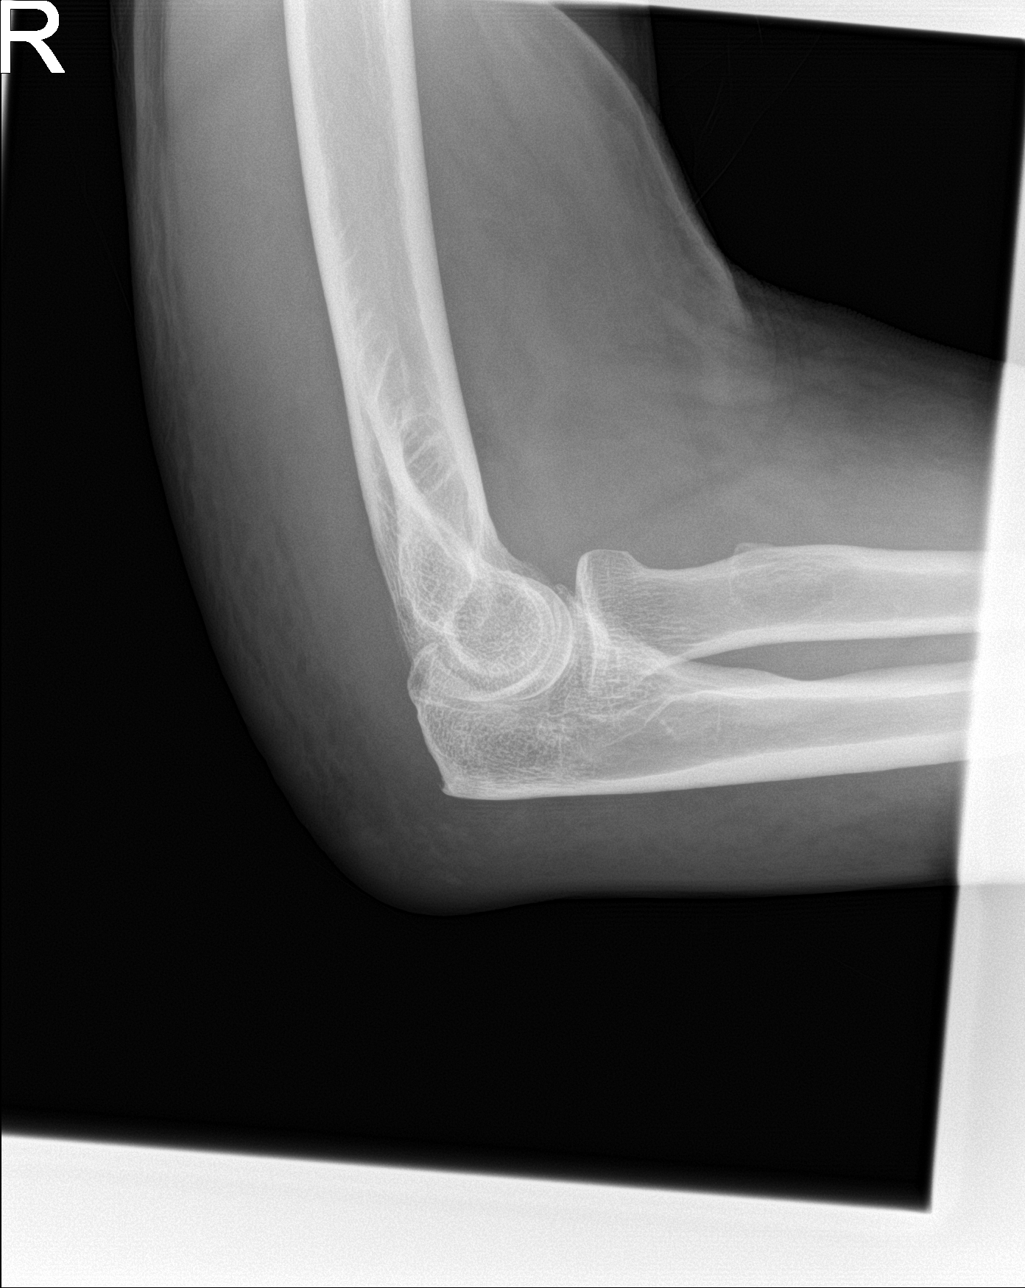

[3 of 3 positions shown; findings below may reference images not displayed]

FINDINGS: There is no evidence of acute fracture, dislocation, or joint
effusion. A small bony spur is seen involving the olecranon process.
Marked severity soft tissue swelling is seen along the dorsal aspect
of the right elbow. This extends from the posterior aspect of the
mid right humerus through the visualized portion of the mid right
forearm.
IMPRESSION: Marked severity soft tissue swelling, as described above, without an
acute osseous abnormality.

## 2021-05-21 IMAGING — DX DG HAND 2V*R*
2 series · 2 of 2 positions shown · non-contrast
Comparison: None.

CLINICAL DATA: Right hand swelling.

EXAM:
RIGHT HAND - 2 VIEW

[hand ap]
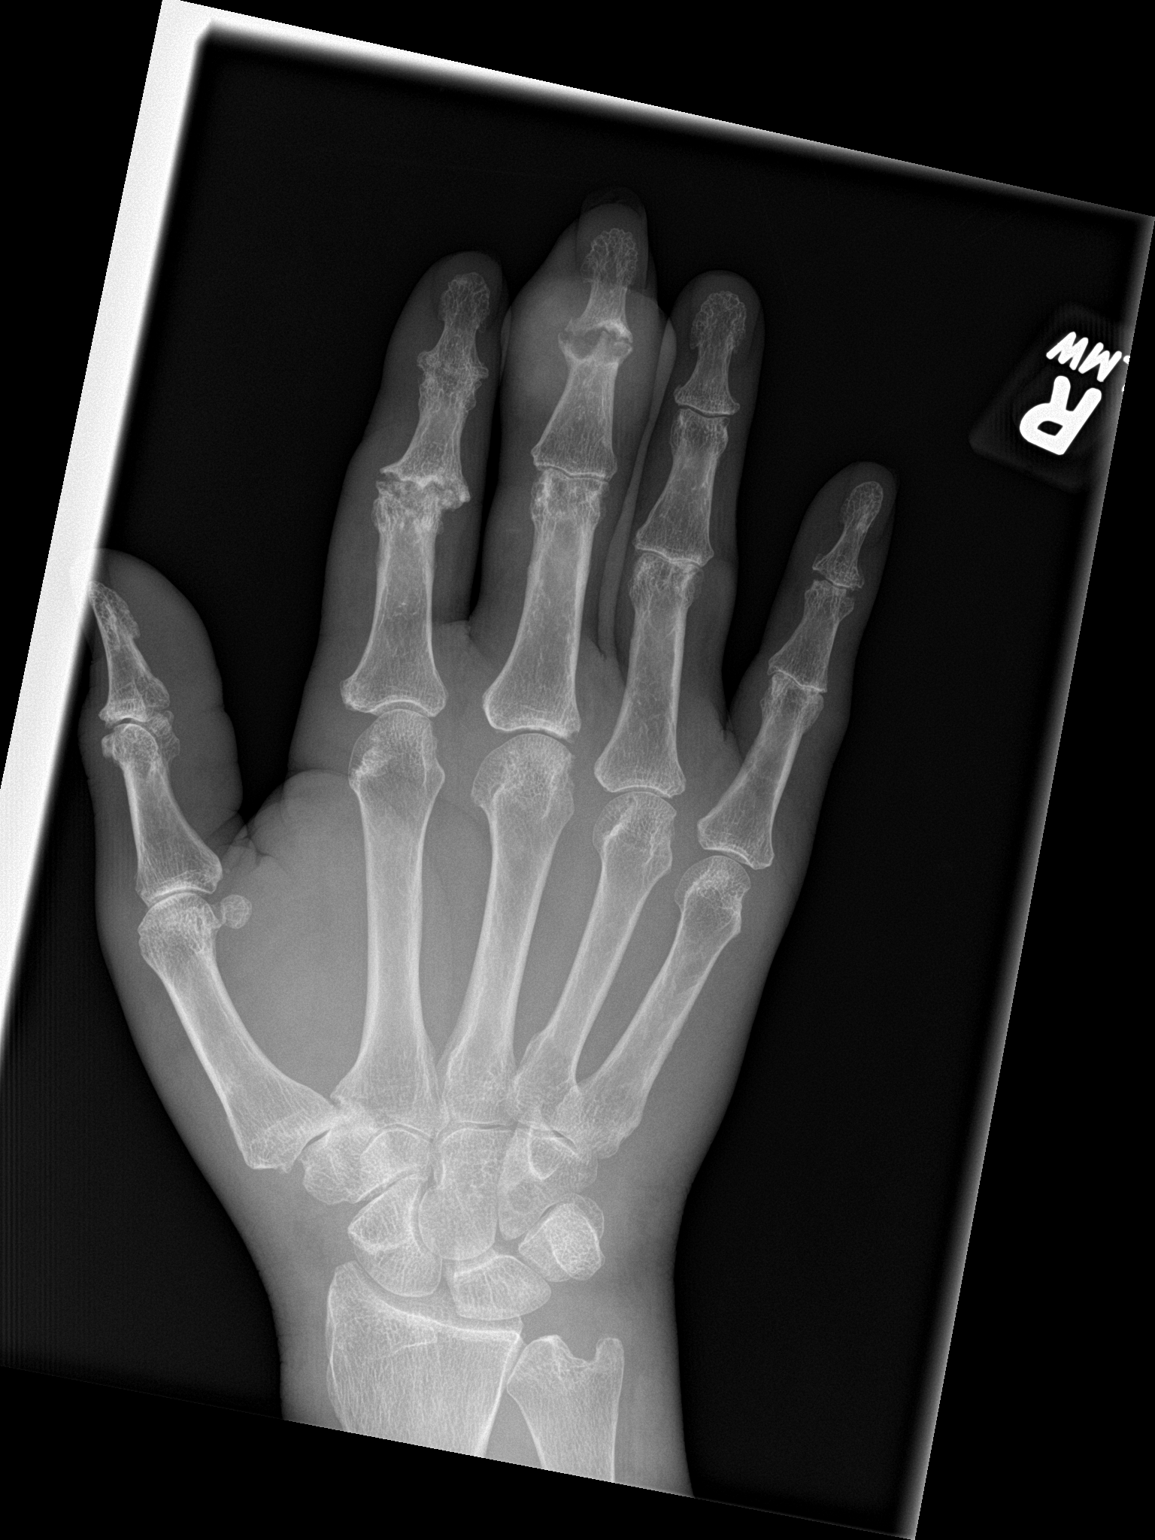

[hand lat]
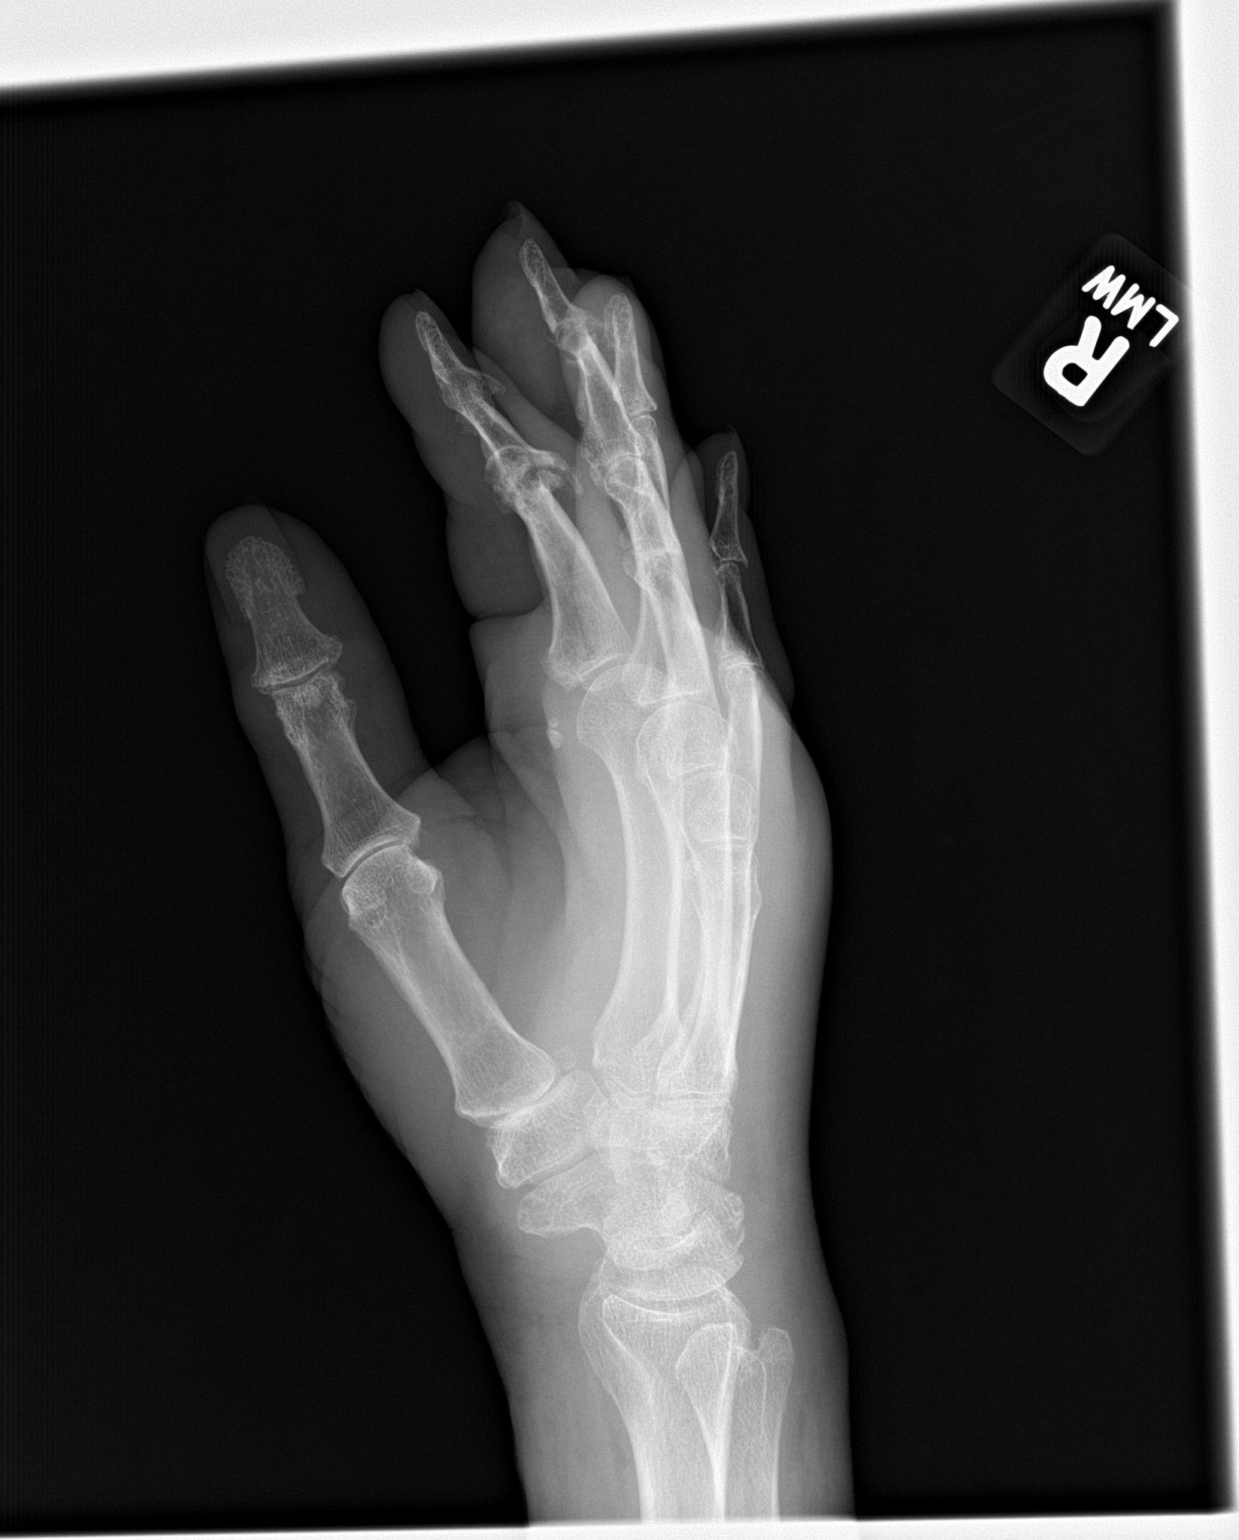

[2 of 2 positions shown; findings below may reference images not displayed]

FINDINGS: There is no evidence of an acute fracture or dislocation. Chronic
deformities are seen involving the PIP joint of the second right
finger and DIP joint of the third right finger. Degenerative changes
are noted throughout the remaining interphalangeal joints. Moderate
severity diffuse soft tissue swelling is seen, most prominent along
the dorsal aspect of the right hand. Moderate to marked severity
soft tissue swelling of the mid second right finger and distal third
right finger is also seen.
IMPRESSION: Diffuse soft tissue swelling and chronic changes, as described
above, without an acute osseous abnormality.

## 2021-05-21 IMAGING — US US EXTREM  UP VENOUS*R*
1 series · 13 of 24 positions shown · non-contrast
Comparison: None.

CLINICAL DATA: Forearm swelling with pain and edema.



[Series 1: us venous img upper uni right (dvt) · portal-venous · 13 of 35 slices shown]
[im 1/35]
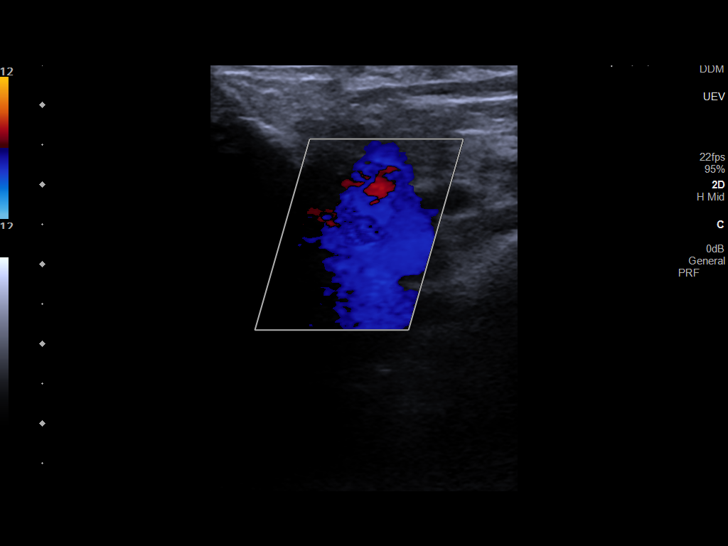
[im 3/35]
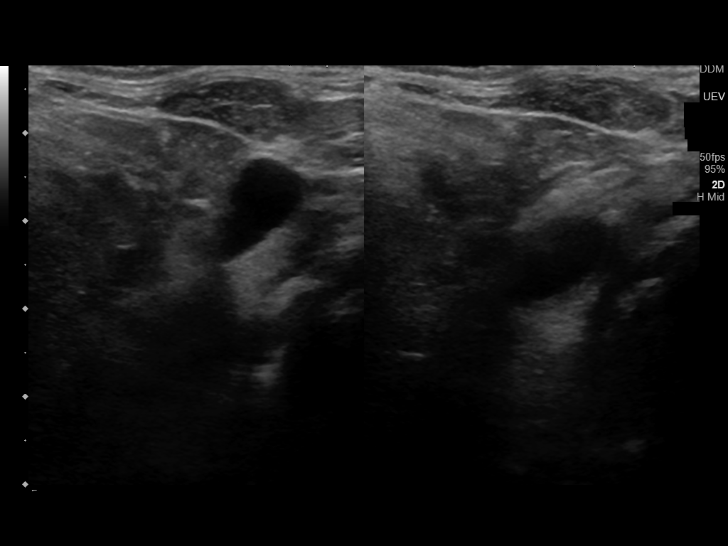
[im 6/35]
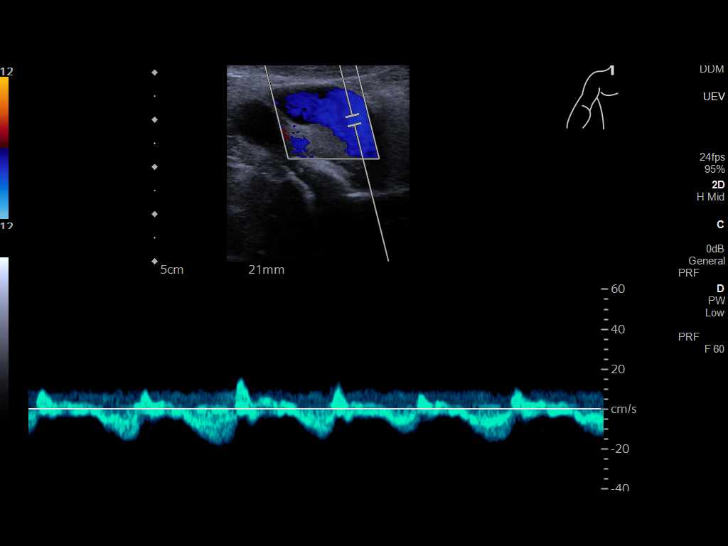
[im 9/35]
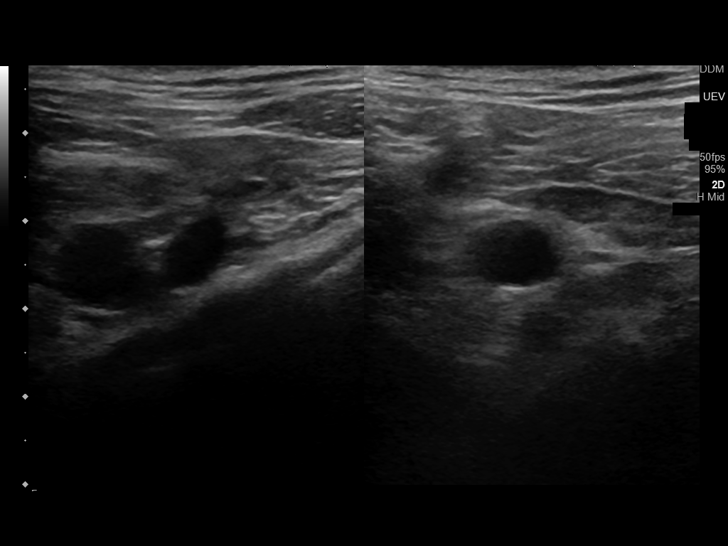
[im 12/35]
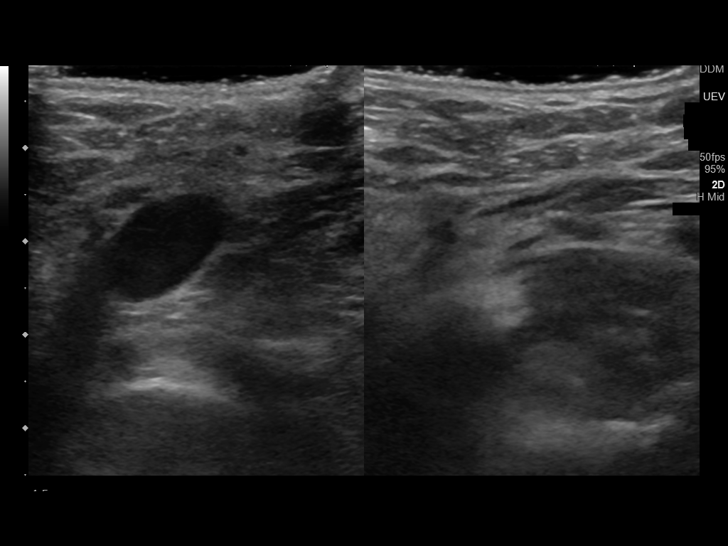
[im 15/35]
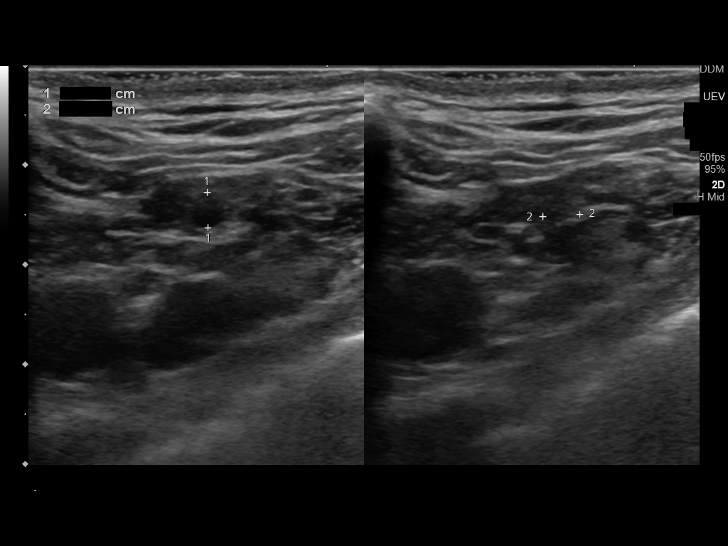
[im 18/35]
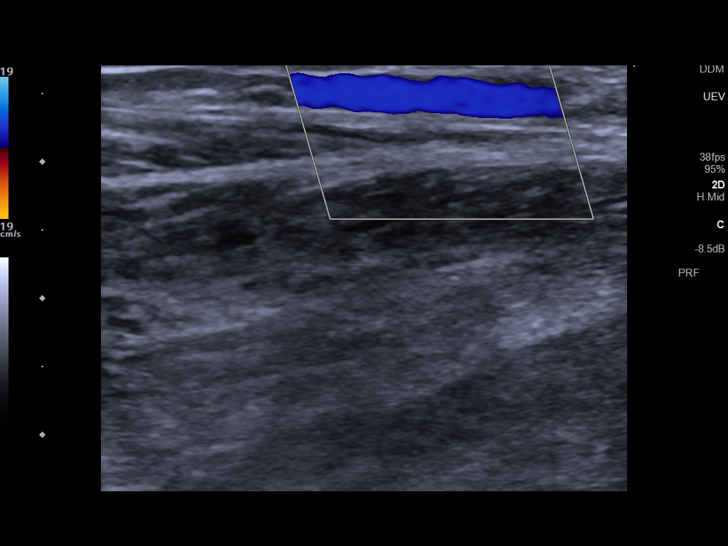
[im 20/35]
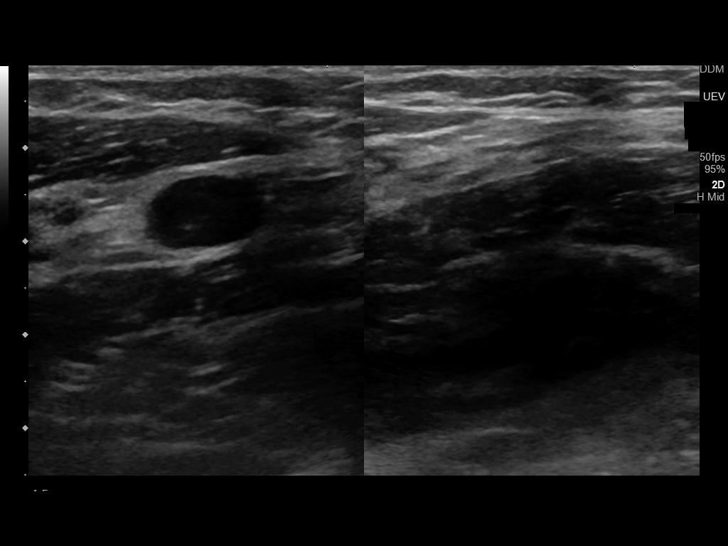
[im 23/35]
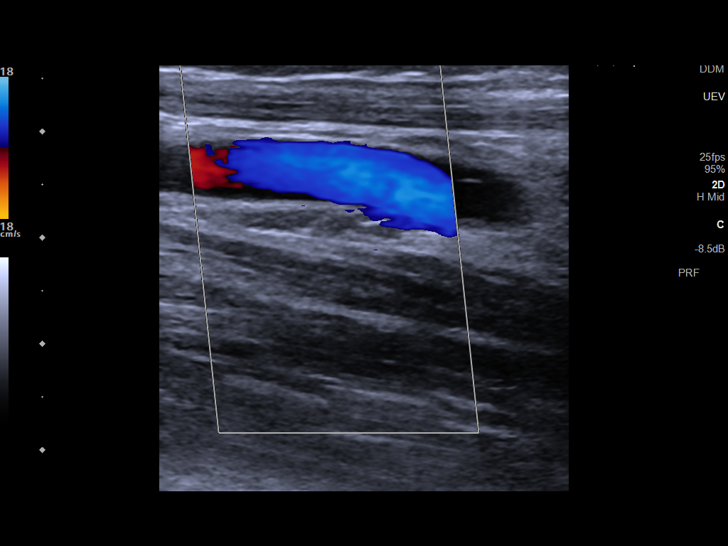
[im 26/35]
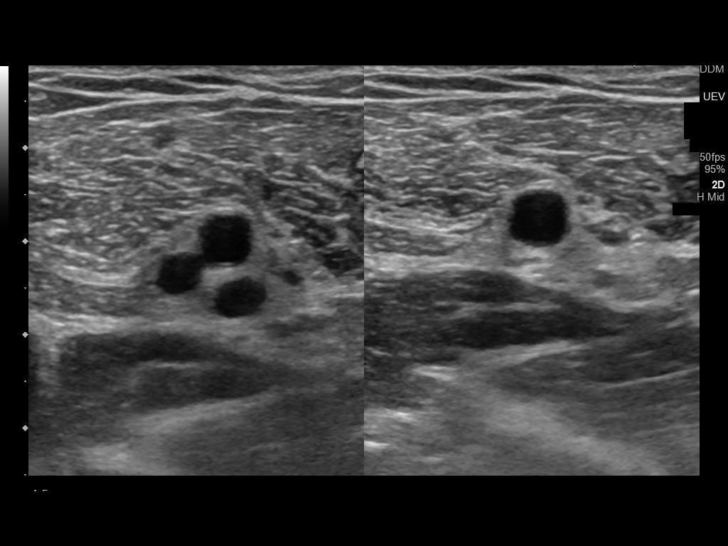
[im 29/35]
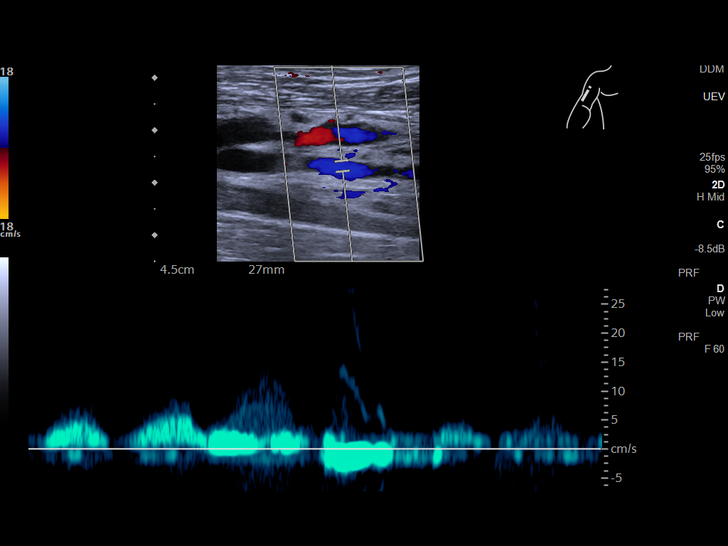
[im 32/35]
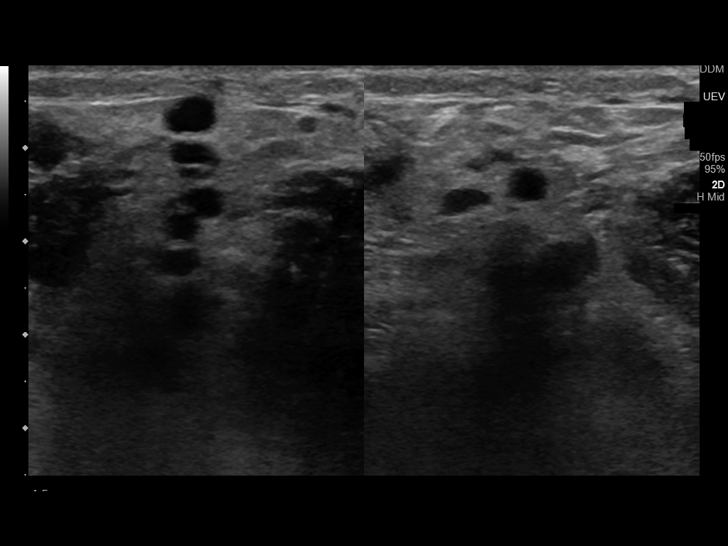
[im 35/35]
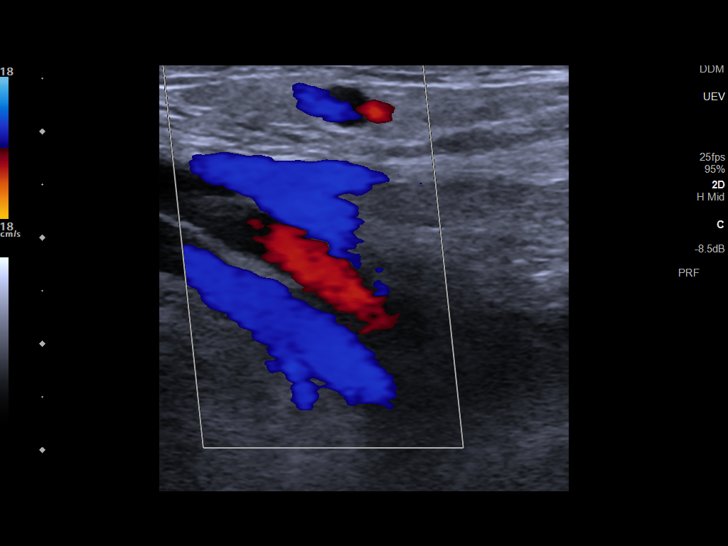

[13 of 24 positions shown; findings below may reference images not displayed]

FINDINGS: Contralateral Subclavian Vein: Respiratory phasicity is normal and
symmetric with the symptomatic side. No evidence of thrombus. Normal
compressibility.

Internal Jugular Vein: No evidence of thrombus. Normal
compressibility, respiratory phasicity and response to augmentation.

Subclavian Vein: No evidence of thrombus. Normal compressibility,
respiratory phasicity and response to augmentation.

Axillary Vein: No evidence of thrombus. Normal compressibility,
respiratory phasicity and response to augmentation.

Cephalic Vein: No evidence of thrombus. Normal compressibility,
respiratory phasicity and response to augmentation.

Basilic Vein: No evidence of thrombus. Normal compressibility,
respiratory phasicity and response to augmentation.

Brachial Veins: No evidence of thrombus. Normal compressibility,
respiratory phasicity and response to augmentation.

Radial Veins: No evidence of thrombus. Normal compressibility,
respiratory phasicity and response to augmentation.

Ulnar Veins: No evidence of thrombus. Normal compressibility,
respiratory phasicity and response to augmentation.

Venous Reflux:  None visualized.

Other Findings:  None visualized.
IMPRESSION: No evidence of DVT within the right upper extremity.

## 2021-05-21 MED ORDER — CLONIDINE HCL 0.1 MG PO TABS
0.1000 mg | ORAL_TABLET | Freq: Two times a day (BID) | ORAL | Status: DC
  Administered 2021-05-21 – 2021-05-24 (×6): 0.1 mg via ORAL
  Filled 2021-05-21 (×6): qty 1

## 2021-05-21 MED ORDER — INDOMETHACIN ER 75 MG PO CPCR
75.0000 mg | ORAL_CAPSULE | Freq: Once | ORAL | Status: DC
  Filled 2021-05-21: qty 1

## 2021-05-21 MED ORDER — APIXABAN 2.5 MG PO TABS
2.5000 mg | ORAL_TABLET | Freq: Two times a day (BID) | ORAL | Status: DC
  Administered 2021-05-21 – 2021-05-24 (×6): 2.5 mg via ORAL
  Filled 2021-05-21 (×8): qty 1

## 2021-05-21 MED ORDER — HYDRALAZINE HCL 50 MG PO TABS
25.0000 mg | ORAL_TABLET | Freq: Three times a day (TID) | ORAL | Status: DC
  Administered 2021-05-21 – 2021-05-22 (×4): 25 mg via ORAL
  Filled 2021-05-21 (×5): qty 1

## 2021-05-21 MED ORDER — ONDANSETRON HCL 4 MG/2ML IJ SOLN: 4.0000 mg | Freq: Four times a day (QID) | INTRAMUSCULAR | Status: DC | PRN

## 2021-05-21 MED ORDER — TAMSULOSIN HCL 0.4 MG PO CAPS
0.4000 mg | ORAL_CAPSULE | Freq: Every day | ORAL | Status: DC
  Administered 2021-05-22 – 2021-05-23 (×2): 0.4 mg via ORAL
  Filled 2021-05-21 (×2): qty 1

## 2021-05-21 MED ORDER — ASPIRIN 325 MG PO TABS
325.0000 mg | ORAL_TABLET | Freq: Every day | ORAL | Status: DC
  Administered 2021-05-22 – 2021-05-24 (×3): 325 mg via ORAL
  Filled 2021-05-21 (×4): qty 1

## 2021-05-21 MED ORDER — INDOMETHACIN 50 MG PO CAPS
50.0000 mg | ORAL_CAPSULE | Freq: Once | ORAL | Status: AC
  Administered 2021-05-22: 50 mg via ORAL
  Filled 2021-05-21: qty 1

## 2021-05-21 MED ORDER — ISOSORBIDE MONONITRATE ER 30 MG PO TB24
30.0000 mg | ORAL_TABLET | Freq: Every day | ORAL | Status: DC
  Administered 2021-05-22: 30 mg via ORAL
  Filled 2021-05-21 (×2): qty 1

## 2021-05-21 MED ORDER — FINASTERIDE 5 MG PO TABS
5.0000 mg | ORAL_TABLET | Freq: Every day | ORAL | Status: DC
  Administered 2021-05-22 – 2021-05-24 (×3): 5 mg via ORAL
  Filled 2021-05-21 (×4): qty 1

## 2021-05-21 MED ORDER — SODIUM CHLORIDE 0.9 % IV BOLUS
1000.0000 mL | Freq: Once | INTRAVENOUS | Status: AC
  Administered 2021-05-21: 1000 mL via INTRAVENOUS

## 2021-05-21 MED ORDER — SODIUM CHLORIDE 0.9 % IV SOLN
1.0000 g | Freq: Once | INTRAVENOUS | Status: AC
  Administered 2021-05-21: 1 g via INTRAVENOUS
  Filled 2021-05-21: qty 10

## 2021-05-21 MED ORDER — CEFTRIAXONE SODIUM 1 G IJ SOLR
1.0000 g | INTRAMUSCULAR | Status: DC
  Administered 2021-05-22 – 2021-05-23 (×2): 1 g via INTRAVENOUS
  Filled 2021-05-21: qty 1
  Filled 2021-05-21 (×2): qty 10

## 2021-05-21 MED ORDER — METOPROLOL TARTRATE 50 MG PO TABS
50.0000 mg | ORAL_TABLET | Freq: Two times a day (BID) | ORAL | Status: DC
  Administered 2021-05-21 – 2021-05-22 (×3): 50 mg via ORAL
  Filled 2021-05-21 (×3): qty 1

## 2021-05-21 MED ORDER — HYDROCODONE-ACETAMINOPHEN 5-325 MG PO TABS
1.0000 | ORAL_TABLET | Freq: Once | ORAL | Status: AC
Start: 2021-05-21 — End: 2021-05-21
  Administered 2021-05-21: 1 via ORAL
  Filled 2021-05-21: qty 1

## 2021-05-21 MED ORDER — COLCHICINE 0.6 MG PO TABS
0.6000 mg | ORAL_TABLET | Freq: Every day | ORAL | Status: DC
  Filled 2021-05-21: qty 1

## 2021-05-21 MED ORDER — ACETAMINOPHEN 650 MG RE SUPP: 650.0000 mg | Freq: Four times a day (QID) | RECTAL | Status: DC | PRN

## 2021-05-21 MED ORDER — ACETAMINOPHEN 325 MG PO TABS
650.0000 mg | ORAL_TABLET | Freq: Four times a day (QID) | ORAL | Status: DC | PRN
  Administered 2021-05-23: 04:00:00 650 mg via ORAL
  Filled 2021-05-21: qty 2

## 2021-05-21 MED ORDER — FELODIPINE ER 10 MG PO TB24
10.0000 mg | ORAL_TABLET | Freq: Every day | ORAL | Status: DC
  Administered 2021-05-22: 17:00:00 10 mg via ORAL
  Filled 2021-05-21 (×3): qty 1

## 2021-05-21 MED ORDER — ATORVASTATIN CALCIUM 20 MG PO TABS
40.0000 mg | ORAL_TABLET | Freq: Every day | ORAL | Status: DC
  Administered 2021-05-21 – 2021-05-23 (×3): 40 mg via ORAL
  Filled 2021-05-21 (×3): qty 2

## 2021-05-21 MED ORDER — VANCOMYCIN HCL 1750 MG/350ML IV SOLN
1750.0000 mg | Freq: Once | INTRAVENOUS | Status: AC
  Administered 2021-05-21: 1750 mg via INTRAVENOUS
  Filled 2021-05-21: qty 350

## 2021-05-21 MED ORDER — ONDANSETRON HCL 4 MG PO TABS: 4.0000 mg | ORAL_TABLET | Freq: Four times a day (QID) | ORAL | Status: DC | PRN

## 2021-05-21 MED ORDER — COLCHICINE 0.6 MG PO TABS
0.6000 mg | ORAL_TABLET | Freq: Every day | ORAL | Status: DC
  Administered 2021-05-22 – 2021-05-24 (×4): 0.6 mg via ORAL
  Filled 2021-05-21 (×5): qty 1

## 2021-05-21 MED ORDER — FAMOTIDINE 20 MG PO TABS
20.0000 mg | ORAL_TABLET | Freq: Two times a day (BID) | ORAL | Status: DC
  Administered 2021-05-21 – 2021-05-24 (×6): 20 mg via ORAL
  Filled 2021-05-21 (×6): qty 1

## 2021-05-21 NOTE — H&P (Addendum)
History and Physical   George Bolton NKN:[id number] DOB: 1941-04-08 DOA: 05/21/2021  PCP: Denton Lank, MD  Outpatient Specialists: Dr. Clayborn Bigness, cardiology Patient coming from: home   I have personally briefly reviewed patient's old medical records in [place].  Chief Concern: right arm pain  HPI: George Bolton is a 80 y.o. male with medical history significant for atrial fibrillation, hypertension, gout, hyperlipidemia, presents to the emergency department for chief concerns of right arm pain.  He reports that the right arm pain and swelling for the last 2 days. He denies trauma.  He denies fever at home.  He denies nausea, vomiting, chest pain, shortness of breath, abdominal pain, dysuria, diarrhea.  Social history: He lives at home with his wife.  He denies tobacco, EtOH, recreational drug use.  He is retired and formerly worked as a Art gallery manager.  Vaccination history: He is vaccinated for COVID-19 with 2 doses, no booster.  ROS: Constitutional: no weight change, no fever ENT/Mouth: no sore throat, no rhinorrhea Eyes: no eye pain, no vision changes Cardiovascular: no chest pain, no dyspnea,  no edema, no palpitations Respiratory: no cough, no sputum, no wheezing Gastrointestinal: no nausea, no vomiting, no diarrhea, no constipation Genitourinary: no urinary incontinence, no dysuria, no hematuria Musculoskeletal: + arthralgias, + myalgias Skin: no skin lesions, no pruritus, + right arm swelling  Neuro: + weakness, no loss of consciousness, no syncope Psych: no anxiety, no depression, + decrease appetite Heme/Lymph: no bruising, no bleeding  ED Course: Discussed with emergency medicine provider, patient requiring hospitalization for atrial fibrillation with RVR.  Vitals in the emergency department was remarkable for temperature of 99.2, respiration rate of 22, heart rate 90, blood pressure 132/76, SPO2 of 96% on room air.  Labs in the emergency department was remarkable  for WBC 14.8, hemoglobin 13, platelets 270, sodium 139, potassium 4.2, chloride 111, bicarb 20, BUN of 30, serum creatinine of 1.60, nonfasting blood glucose 125, EGFR 43.  Initial lactic acid is 1.3.  UA showed leukocytes.  COVID PCR was negative.  Assessment/Plan  Principal Problem:   Cellulitis Active Problems:   CHF (congestive heart failure) (HCC)   Hypertension   Atrial fibrillation (HCC)   Chronic diastolic congestive heart failure (HCC)   Atrial fibrillation with RVR (HCC)   CKD (chronic kidney disease), stage III ([place])   Sepsis ([place])   # Atrial fibrillation with RVR suspect secondary to cellulitis # Patient meets sepsis criteria with elevated heart rate, A. fib with RVR, leukocytosis of 14.8, and source of right arm cellulitis - Status post vancomycin and ceftriaxone per EDP - Right upper extremity ultrasound was negative for DVT - Check Pro-Calcitonin - Right hand x-ray and right elbow x-ray ordered to assess for collections of fluid and was negative for fluid collection for aspiration  - Ceftriaxone IV started by myself - Indomethacin 50 mg p.o. once - Colchicine 0.6 mg p.o. daily started, first dose today - Steroid was deferred due to cellulitis - Check sed rate - CBC BMP in a.m.  # Atrial fibrillation-Eliquis 2.5 mg twice daily resumed - EKG ordered for release into epic, discussed with RN  # Hypertension-resume Klonopin 0.5 mg p.o. twice daily, felodipine 10 mg daily, hydralazine 25 mg 3 times daily, isosorbide mononitrate 30 mg daily, metoprolol tartrate 50 mg twice daily  # CKD 3 -at baseline  # Hyperlipidemia-atorvastatin 40 mg nightly  # BPH-Flomax 0.5 mg daily, finasteride 5 mg daily  Chart reviewed.   DVT prophylaxis: Eliquis Code Status: Full  code Diet: Heart healthy/carb modified Family Communication: No Disposition Plan: Pending clinical course Consults called: No Admission status: Pending clinical course  Past Medical History:  Diagnosis  Date   Chronic atrial fibrillation ([place])    a. Afib dates back to at least 2002 when reviewing prior EKG with EKGs from 2014 onward showing Afib; b. CHADS2VASc at least 4 (CHF, HTN, age x 2)   Chronic diastolic CHF (congestive heart failure) ([place])    a. TTE 5/19: EF 70-75%, mod LVH, near complete obliteration of the mid and apical LV cavity (can be seen in apical hypertrophic CM), mildly dilated LA, RV cavity size nl, RV wall thickness nl, RVSF mildly reduced, mildly dilated RA; b. 02/2018 Limited echo w/ definity: EF 50-65%, no apical hypertrophic CM.   Gout    Hypertension    Past Surgical History:  Procedure Laterality Date   CHOLECYSTECTOMY N/A 01/13/2017   Procedure: LAPAROSCOPIC CHOLECYSTECTOMY;  Surgeon: Jules Husbands, MD;  Location: [facility] ORS;  Service: General;  Laterality: N/A;   Social History:  reports that he has never smoked. He has never used smokeless tobacco. He reports that he does not drink alcohol and does not use drugs.  Allergies  Allergen Reactions   Cortisone Anaphylaxis   Family History  Problem Relation Age of Onset   Stroke Mother    Family history: Family history reviewed and not pertinent  Prior to Admission medications   Medication Sig Start Date End Date Taking? Authorizing Provider  apixaban (ELIQUIS) 2.5 MG TABS tablet Take by mouth. 12/27/19   [provider]  aspirin 325 MG tablet Take 325 mg by mouth daily.    [provider]  atorvastatin (LIPITOR) 40 MG tablet Take 1 tablet by mouth daily.    [provider]  cefadroxil (DURICEF) 500 MG capsule Take 500 mg by mouth 2 (two) times daily. 05/31/20   [provider]  cloNIDine (CATAPRES) 0.1 MG tablet Take 1 tablet by mouth 2 (two) times daily. 05/28/20   [provider]  colchicine 0.6 MG tablet Take by mouth. 12/27/19   [provider]  famotidine (PEPCID) 20 MG tablet Take 1 tablet (20 mg total) by mouth 2 (two) times daily. 02/15/21   Carrie Mew,  MD  felodipine (PLENDIL) 10 MG 24 hr tablet Take 10 mg by mouth daily.    [provider]  finasteride (PROSCAR) 5 MG tablet Take 1 tablet (5 mg total) by mouth daily. 08/21/20   Zara Council A, PA-C  furosemide (LASIX) 40 MG tablet Take 1 tablet (40 mg total) by mouth daily. 04/24/18   Hillary Bow, MD  hydrALAZINE (APRESOLINE) 25 MG tablet Take 1 tablet (25 mg total) by mouth 3 (three) times daily. 09/01/18 08/16/19  Alisa Graff, FNP  HYDROcodone-acetaminophen (NORCO/VICODIN) 5-325 MG tablet Take one tablet at night for pain; may take up to every 6 hours as needed for pain if not working or driving 43/32/95   [provider]  isosorbide mononitrate (IMDUR) 30 MG 24 hr tablet Take by mouth. 12/27/19 12/26/20  [provider]  metoprolol tartrate (LOPRESSOR) 50 MG tablet Take 1 tablet (50 mg total) by mouth 2 (two) times daily. 08/12/18   Alisa Graff, FNP  NYSTATIN powder APPLY  POWDER TOPICALLY TWICE DAILY 09/26/19   Zara Council A, PA-C  predniSONE (STERAPRED UNI-PAK 21 TAB) 10 MG (21) TBPK tablet 6 tablets on day 1, then 5 tablets on day 2, then 4 tablets on day 3, then 3  tablets on day 4, then 2 tablets on day 5, then 1 tablet on day 6. 02/15/21   Carrie Mew, MD  tamsulosin (FLOMAX) 0.4 MG CAPS capsule Take 1 capsule (0.4 mg total) by mouth daily. 08/21/20   Nori Riis, PA-C   Physical Exam: Vitals:   05/21/21 1603 05/21/21 1850 05/21/21 1900 05/21/21 2012  BP:  (!) 157/102 (!) 154/108 (!) 149/107  Pulse:  (!) 116 (!) 115 (!) 106  Resp:  (!) 23 (!) 25 18  Temp:      TempSrc:      SpO2:  98% 97% 96%  Weight: 83.9 kg     Height: 6' (1.829 m)      Constitutional: appears age appropriate, NAD, calm, comfortable Eyes: PERRL, lids and conjunctivae normal ENMT: Mucous membranes are moist. Posterior pharynx clear of any exudate or lesions. Age-appropriate dentition. Hearing appropriate Neck: normal, supple, no masses, no  thyromegaly Respiratory: clear to auscultation bilaterally, no wheezing, no crackles. Normal respiratory effort. No accessory muscle use.  Cardiovascular: Regular rate and rhythm, no murmurs / rubs / gallops. No extremity edema. 2+ pedal pulses. No carotid bruits.  Abdomen: no tenderness, no masses palpated, no hepatosplenomegaly. Bowel sounds positive.  Musculoskeletal: no clubbing / cyanosis. No joint deformity upper and lower extremities. Good ROM, no contractures, no atrophy. Normal muscle tone.  Skin: no rashes, lesions, ulcers. No induration Neurologic: Sensation intact. Strength 5/5 in all 4.  Psychiatric: Normal judgment and insight. Alert and oriented x 3. Normal mood.   EKG: ordered and pending   Chest x-ray on Admission: I personally reviewed and I agree with radiologist reading as below.  US Venous Img Upper Right (DVT Study)  Result Date: 05/21/2021 CLINICAL DATA:  Forearm swelling with pain and edema. EXAM: Right UPPER EXTREMITY VENOUS DOPPLER ULTRASOUND TECHNIQUE: Gray-scale sonography with graded compression, as well as color Doppler and duplex ultrasound were performed to evaluate the upper extremity deep venous system from the level of the subclavian vein and including the jugular, axillary, basilic, radial, ulnar and upper cephalic vein. Spectral Doppler was utilized to evaluate flow at rest and with distal augmentation maneuvers. COMPARISON:  None. FINDINGS: Contralateral Subclavian Vein: Respiratory phasicity is normal and symmetric with the symptomatic side. No evidence of thrombus. Normal compressibility. Internal Jugular Vein: No evidence of thrombus. Normal compressibility, respiratory phasicity and response to augmentation. Subclavian Vein: No evidence of thrombus. Normal compressibility, respiratory phasicity and response to augmentation. Axillary Vein: No evidence of thrombus. Normal compressibility, respiratory phasicity and response to augmentation. Cephalic Vein: No  evidence of thrombus. Normal compressibility, respiratory phasicity and response to augmentation. Basilic Vein: No evidence of thrombus. Normal compressibility, respiratory phasicity and response to augmentation. Brachial Veins: No evidence of thrombus. Normal compressibility, respiratory phasicity and response to augmentation. Radial Veins: No evidence of thrombus. Normal compressibility, respiratory phasicity and response to augmentation. Ulnar Veins: No evidence of thrombus. Normal compressibility, respiratory phasicity and response to augmentation. Venous Reflux:  None visualized. Other Findings:  None visualized. IMPRESSION: No evidence of DVT within the right upper extremity. Electronically Signed   By: Van Clines M.D.   On: 05/21/2021 20:27   DG Chest Port 1 View  Result Date: 05/21/2021 CLINICAL DATA:  Forearm swelling EXAM: PORTABLE CHEST 1 VIEW COMPARISON:  Radiograph and CT 01/03/2021 FINDINGS: Low volumes and atelectasis with a background of chronically coarsened interstitial and emphysematous changes which are similar to priors. No pneumothorax, effusion, focal consolidation. Mild vascular congestion without features of frank pulmonary edema. Hilar  adenopathy seen on comparison CT imaging is less well visualized on this exam particular in the absence of lateral view. Cardiomediastinal contours are stable from prior with a calcified tortuous aorta. Degenerative changes are present in the imaged spine and shoulders. No acute osseous abnormality or suspicious osseous lesion. IMPRESSION: Low volumes and atelectasis. Mild vascular crowding without frank edema. Background of chronically coarsened interstitial and bronchitic changes. Previously seen hilar adenopathy is not well visualized on these radiographic images. Aortic Atherosclerosis (ICD10-I70.0). Electronically Signed   By: Lovena Le M.D.   On: 05/21/2021 20:02    Labs on Admission: I have personally reviewed following  labs  CBC: Recent Labs  Lab 05/21/21 1616  WBC 14.8*  NEUTROABS 11.7*  HGB 13.0  HCT 39.0  MCV 83.2  PLT 562   Basic Metabolic Panel: Recent Labs  Lab 05/21/21 1616  NA 139  K 4.2  CL 111  CO2 20*  GLUCOSE 125*  BUN 30*  CREATININE 1.60*  CALCIUM 9.5   GFR: Estimated Creatinine Clearance: 39.1 mL/min (A) (by C-G formula based on SCr of 1.6 mg/dL (H)).  Liver Function Tests: Recent Labs  Lab 05/21/21 1616  AST 15  ALT 8  ALKPHOS 64  BILITOT 1.1  PROT 7.3  ALBUMIN 3.1*   Urine analysis:    Component Value Date/Time   COLORURINE YELLOW (A) 05/21/2021 1616   APPEARANCEUR HAZY (A) 05/21/2021 1616   APPEARANCEUR Clear 07/21/2018 0959   LABSPEC 1.019 05/21/2021 1616   LABSPEC 1.014 09/23/2012 1438   PHURINE 5.0 05/21/2021 1616   GLUCOSEU NEGATIVE 05/21/2021 1616   GLUCOSEU 50 mg/dL 09/23/2012 1438   HGBUR SMALL (A) 05/21/2021 1616   BILIRUBINUR NEGATIVE 05/21/2021 1616   BILIRUBINUR Negative 07/21/2018 0959   BILIRUBINUR Negative 09/23/2012 1438   [place] 05/21/2021 1616   PROTEINUR >=300 (A) 05/21/2021 1616   NITRITE NEGATIVE 05/21/2021 1616   LEUKOCYTESUR SMALL (A) 05/21/2021 1616   LEUKOCYTESUR Negative 09/23/2012 1438   Dr. Tobie Poet [facility]  If 7PM-7AM, please contact overnight-coverage provider If 7AM-7PM, please contact day coverage provider [email or web address]  05/21/2021, 9:15 PM

## 2021-05-21 NOTE — ED Notes (Signed)
Assisted pt with urinal and repositioned in bed with blankets to pt comfort level. No acute distress noted.

## 2021-05-21 NOTE — ED Notes (Signed)
[place] sent to the lab.  

## 2021-05-21 NOTE — ED Provider Notes (Signed)
[facility] Emergency Department Provider Note   ____________________________________________   Event Date/Time   First MD Initiated Contact with Patient 05/21/21 1839     (approximate)  I have reviewed the triage vital signs and the nursing notes.   HISTORY  Chief Complaint Arm Swelling    HPI George Bolton is a 80 y.o. 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 Afib dates back to at least 2002 when reviewing prior EKG with EKGs from 2014 onward showing Afib; b. CHADS2VASc at least 4 (CHF, HTN, age x 2)   Chronic diastolic CHF (congestive heart failure) ([place])    a. TTE 5/19: EF 70-75%, mod LVH, near complete obliteration of the mid and apical LV cavity (can be seen in apical hypertrophic CM), mildly dilated LA, RV cavity size nl, RV wall thickness nl, RVSF mildly reduced, mildly dilated RA; b. 02/2018 Limited echo w/ definity: EF 50-65%, no apical hypertrophic CM.   Gout    Hypertension     Patient Active Problem List   Diagnosis Date Noted   Cellulitis 05/21/2021   Atrial fibrillation with RVR ([place]) 05/21/2021   CKD (chronic kidney disease), stage III ([place]) 05/21/2021   Sepsis ([place]) 05/21/2021   Chronic diastolic congestive heart failure ([place]) 12/01/2018   Atrial fibrillation ([place]) 03/15/2018    Lymphedema 03/15/2018   Gout 01/25/2017   CHF (congestive heart failure) ([place]) 04/25/2015   Hypertension 04/25/2015    Past Surgical History:  Procedure Laterality Date   CHOLECYSTECTOMY N/A 01/13/2017   Procedure: LAPAROSCOPIC CHOLECYSTECTOMY;  Surgeon: Jules Husbands, MD;  Location: [facility] ORS;  Service: General;  Laterality: N/A;    Prior to Admission medications   Medication Sig Start Date End Date Taking? Authorizing Provider  atorvastatin (LIPITOR) 40 MG tablet Take 40 mg by mouth daily. 12/27/19  Yes [provider]  cloNIDine (CATAPRES) 0.1 MG tablet Take 1 tablet by mouth 2 (two) times daily. 05/28/20  Yes [provider]  diphenhydrAMINE-zinc acetate (BENADRYL) cream Apply 1 application topically 2 (two) times daily as needed for itching. 04/14/21 04/14/22 Yes [provider]  felodipine (PLENDIL) 10 MG 24 hr tablet Take 10 mg by mouth daily.   Yes [provider]  furosemide (LASIX) 40 MG tablet Take 1 tablet (40 mg total) by mouth daily. 04/24/18  Yes Hillary Bow, MD  hydrALAZINE (APRESOLINE) 25 MG tablet Take 1 tablet (25 mg total) by mouth 3 (three) times daily. 09/01/18 05/21/21 Yes Hackney, Otila Kluver A, FNP  isosorbide mononitrate (IMDUR) 30 MG 24 hr tablet Take by mouth. 12/27/19 05/21/21 Yes [provider]  apixaban (ELIQUIS) 2.5 MG TABS tablet Take by mouth. 12/27/19   [provider]  aspirin 325 MG tablet Take 325 mg by mouth daily.    [provider]  atorvastatin (LIPITOR) 40 MG tablet  Take 1 tablet by mouth daily.    [provider]  cefadroxil (DURICEF) 500 MG capsule Take 500 mg by mouth 2 (two) times daily. 05/31/20   [provider]  colchicine 0.6 MG tablet Take by mouth. 12/27/19   [provider]  famotidine (PEPCID) 20 MG tablet Take 1 tablet (20 mg total) by mouth 2 (two) times daily. 02/15/21   Carrie Mew, MD  finasteride (PROSCAR) 5 MG tablet Take 1 tablet (5 mg total) by mouth  daily. 08/21/20   Zara Council A, PA-C  HYDROcodone-acetaminophen (NORCO/VICODIN) 5-325 MG tablet Take one tablet at night for pain; may take up to every 6 hours as needed for pain if not working or driving [id number]   [provider]  metoprolol tartrate (LOPRESSOR) 50 MG tablet Take 1 tablet (50 mg total) by mouth 2 (two) times daily. 08/12/18   Alisa Graff, FNP  NYSTATIN powder APPLY  POWDER TOPICALLY TWICE DAILY 09/26/19   Zara Council A, PA-C  predniSONE (STERAPRED UNI-PAK 21 TAB) 10 MG (21) TBPK tablet 6 tablets on day 1, then 5 tablets on day 2, then 4 tablets on day 3, then 3 tablets on day 4, then 2 tablets on day 5, then 1 tablet on day 6. 02/15/21   Carrie Mew, MD  tamsulosin (FLOMAX) 0.4 MG CAPS capsule Take 1 capsule (0.4 mg total) by mouth daily. 08/21/20   Zara Council A, PA-C    Allergies Cortisone  Family History  Problem Relation Age of Onset   Stroke Mother     Social History Social History   Tobacco Use   Smoking status: Never   Smokeless tobacco: Never  Substance Use Topics   Alcohol use: No   Drug use: No    Review of Systems Constitutional: No fever/chills Eyes: No visual changes. ENT: No sore throat. Cardiovascular: Denies chest pain. Respiratory: Denies shortness of breath. Gastrointestinal: No abdominal pain.  No nausea, no vomiting.  No diarrhea. Genitourinary: Negative for dysuria. Musculoskeletal: Positive for acute right upper extremity pain and swelling Skin: Positive for erythema and edema to the right upper extremity Neurological: Negative for headaches, weakness/numbness/paresthesias in any extremity Psychiatric: Negative for suicidal ideation/homicidal ideation   ____________________________________________   PHYSICAL EXAM:  VITAL SIGNS: ED Triage Vitals  Enc Vitals Group     BP 05/21/21 1600 132/76     Pulse Rate 05/21/21 1600 90     Resp 05/21/21 1600 (!) 22     Temp 05/21/21 1600 99.2 F (37.3  C)     Temp Source 05/21/21 1600 Oral     SpO2 05/21/21 1600 97 %     Weight 05/21/21 1603 185 lb (83.9 kg)     Height 05/21/21 1603 6' (1.829 m)     Head Circumference --      Peak Flow --      Pain Score 05/21/21 1602 10     Pain Loc --      Pain Edu? --      Excl. in [place]? --    Constitutional: Alert and oriented. Well appearing and in no acute distress. Eyes: Conjunctivae are normal. PERRL. Head: Atraumatic. Nose: No congestion/rhinnorhea. Mouth/Throat: Mucous membranes are moist. Neck: No stridor Cardiovascular: Grossly normal heart sounds.  Good peripheral circulation. Respiratory: Normal respiratory effort.  No retractions. Gastrointestinal: Soft and nontender. No distention. Musculoskeletal: Right upper extremity edema and erythema from the elbow to the hand Neurologic:  Normal speech and language. No gross focal neurologic deficits are appreciated.  Skin:  Skin is warm and dry.  Erythema, edema, tenderness to palpation from the elbow to the hand on the right upper extremity Psychiatric: Mood and affect are normal. Speech and behavior are normal.  ____________________________________________   LABS (all labs ordered are listed, but only abnormal results are displayed)  Labs Reviewed  COMPREHENSIVE METABOLIC PANEL - Abnormal; Notable for the following components:      Result Value   CO2 20 (*)    Glucose, Bld 125 (*)    BUN 30 (*)    Creatinine, Ser 1.60 (*)    Albumin 3.1 (*)    GFR, Estimated 43 (*)    All other components within normal limits  CBC WITH DIFFERENTIAL/PLATELET - Abnormal; Notable for the following components:   WBC 14.8 (*)    RDW 16.3 (*)    Neutro Abs 11.7 (*)    Monocytes Absolute 1.7 (*)    All other components within normal limits  URINALYSIS, COMPLETE (UACMP) WITH MICROSCOPIC - Abnormal; Notable for the following components:   Color, Urine YELLOW (*)    APPearance HAZY (*)    Hgb urine dipstick SMALL (*)    Protein, ur >=300 (*)     Leukocytes,Ua SMALL (*)    Bacteria, UA RARE (*)    All other components within normal limits  RESP PANEL BY RT-PCR (FLU A&B, COVID) [id number]  LACTIC ACID, PLASMA  URIC ACID  PROCALCITONIN  BASIC METABOLIC PANEL  CBC  PROCALCITONIN   ____________________________________________  EKG  ED ECG REPORT I, Naaman Plummer, the attending physician, personally viewed and interpreted this ECG.  Date: 05/21/2021 EKG Time: 1913 Rate: 116 Rhythm: Atrial fibrillation with rapid ventricular response QRS Axis: normal Intervals: normal ST/T Wave abnormalities: normal Narrative Interpretation: no evidence of acute ischemia  ____________________________________________  RADIOLOGY  ED MD interpretation: Doppler ultrasound of the right upper extremity shows no evidence of DVT within the study  One-view portable chest x-ray shows no evidence of acute abnormalities including no pneumonia, pneumothorax, or widened mediastinum  Official radiology report(s): US Venous Img Upper Right (DVT Study)  Result Date: 05/21/2021 CLINICAL DATA:  Forearm swelling with pain and edema. EXAM: Right UPPER EXTREMITY VENOUS DOPPLER ULTRASOUND TECHNIQUE: Gray-scale sonography with graded compression, as well as color Doppler and duplex ultrasound were performed to evaluate the upper extremity deep venous system from the level of the subclavian vein and including the jugular, axillary, basilic, radial, ulnar and upper cephalic vein. Spectral Doppler was utilized to evaluate flow at rest and with distal augmentation maneuvers. COMPARISON:  None. FINDINGS: Contralateral Subclavian Vein: Respiratory phasicity is normal and symmetric with the symptomatic side. No evidence of thrombus. Normal compressibility. Internal Jugular Vein: No evidence of thrombus. Normal compressibility, respiratory phasicity and response to augmentation. Subclavian Vein: No evidence of thrombus. Normal compressibility, respiratory phasicity and response  to augmentation. Axillary Vein: No evidence of thrombus. Normal compressibility, respiratory phasicity and response to augmentation. Cephalic Vein: No evidence of thrombus. Normal compressibility, respiratory phasicity and response to augmentation. Basilic Vein: No evidence of thrombus. Normal compressibility, respiratory phasicity and response to augmentation. Brachial Veins: No evidence of thrombus. Normal compressibility, respiratory phasicity and response to augmentation. Radial Veins: No evidence of thrombus. Normal compressibility, respiratory phasicity and response to augmentation. Ulnar Veins: No evidence of thrombus. Normal compressibility, respiratory phasicity and response to augmentation. Venous Reflux:  None visualized. Other Findings:  None visualized. IMPRESSION: No evidence of DVT within the right upper extremity. Electronically Signed   By: Van Clines  M.D.   On: 05/21/2021 20:27   DG Chest Port 1 View  Result Date: 05/21/2021 CLINICAL DATA:  Forearm swelling EXAM: PORTABLE CHEST 1 VIEW COMPARISON:  Radiograph and CT 01/03/2021 FINDINGS: Low volumes and atelectasis with a background of chronically coarsened interstitial and emphysematous changes which are similar to priors. No pneumothorax, effusion, focal consolidation. Mild vascular congestion without features of frank pulmonary edema. Hilar adenopathy seen on comparison CT imaging is less well visualized on this exam particular in the absence of lateral view. Cardiomediastinal contours are stable from prior with a calcified tortuous aorta. Degenerative changes are present in the imaged spine and shoulders. No acute osseous abnormality or suspicious osseous lesion. IMPRESSION: Low volumes and atelectasis. Mild vascular crowding without frank edema. Background of chronically coarsened interstitial and bronchitic changes. Previously seen hilar adenopathy is not well visualized on these radiographic images. Aortic Atherosclerosis  (ICD10-I70.0). Electronically Signed   By: Lovena Le M.D.   On: 05/21/2021 20:02    ____________________________________________   PROCEDURES  Procedure(s) performed (including Critical Care):  .1-3 Lead EKG Interpretation  Date/Time: 05/23/2021 3:33 PM Performed by: Naaman Plummer, MD Authorized by: Naaman Plummer, MD     Interpretation: abnormal     ECG rate:  105   ECG rate assessment: tachycardic     Rhythm: sinus tachycardia     Ectopy: none     Conduction: normal     ____________________________________________   INITIAL IMPRESSION / ASSESSMENT AND PLAN / ED COURSE  As part of my medical decision making, I reviewed the following data within the electronic medical record, if available:  Nursing notes reviewed and incorporated, Labs reviewed, EKG interpreted, Old chart reviewed, Radiograph reviewed and Notes from prior ED visits reviewed and incorporated        Presentation most consistent with complex cellulitis. Given History, Exam, and Workup I have low suspicion for Necrotizing Fasciitis, Abscess, Osteomyelitis, DVT or other emergent problem as a cause for this presentation.  Tx: Cefepime and vancomycin  Disposition: Given patient's risk factors, patient will require admission to the internal medicine service for further evaluation and management      ____________________________________________   FINAL CLINICAL IMPRESSION(S) / ED DIAGNOSES  Final diagnoses:  Right hand pain     ED Discharge Orders     None        Note:  This document was prepared using Dragon voice recognition software and may include unintentional dictation errors.    Naaman Plummer, MD 05/23/21 [phone]

## 2021-05-21 NOTE — Consult Note (Signed)
PHARMACY -  BRIEF ANTIBIOTIC NOTE   Pharmacy has received consult(s) for vancomycin from an ED provider.  The patient's profile has been reviewed for ht/wt/allergies/indication/available labs.    One time order(s) placed for vancomycin 1750 mg IV   Further antibiotics/pharmacy consults should be ordered by admitting physician if indicated.                       Thank you, Darnelle Bos, PharmD 05/21/2021  7:11 PM

## 2021-05-21 NOTE — ED Notes (Signed)
MD aware of repeated episodes of PVC/runs of V tach

## 2021-05-21 NOTE — ED Notes (Signed)
US at bedside

## 2021-05-21 NOTE — ED Triage Notes (Signed)
[facility] ED [facility] swelling in right arm that started a few days ago.  Significant swelling noted from right hand to above right elbow.  Hx of gout in right hand  Radial pulse felt

## 2021-05-22 ENCOUNTER — Other Ambulatory Visit: Payer: Self-pay

## 2021-05-22 ENCOUNTER — Encounter: Payer: Self-pay | Admitting: Internal Medicine

## 2021-05-22 DIAGNOSIS — Z7982 Long term (current) use of aspirin: Secondary | ICD-10-CM | POA: Diagnosis not present

## 2021-05-22 DIAGNOSIS — Z7901 Long term (current) use of anticoagulants: Secondary | ICD-10-CM | POA: Diagnosis not present

## 2021-05-22 DIAGNOSIS — Z9119 Patient's noncompliance with other medical treatment and regimen: Secondary | ICD-10-CM | POA: Diagnosis not present

## 2021-05-22 DIAGNOSIS — D72829 Elevated white blood cell count, unspecified: Secondary | ICD-10-CM | POA: Diagnosis present

## 2021-05-22 DIAGNOSIS — I4821 Permanent atrial fibrillation: Secondary | ICD-10-CM

## 2021-05-22 DIAGNOSIS — Z79899 Other long term (current) drug therapy: Secondary | ICD-10-CM | POA: Diagnosis not present

## 2021-05-22 DIAGNOSIS — Z20822 Contact with and (suspected) exposure to covid-19: Secondary | ICD-10-CM | POA: Diagnosis present

## 2021-05-22 DIAGNOSIS — Z9049 Acquired absence of other specified parts of digestive tract: Secondary | ICD-10-CM | POA: Diagnosis not present

## 2021-05-22 DIAGNOSIS — I5032 Chronic diastolic (congestive) heart failure: Secondary | ICD-10-CM | POA: Diagnosis present

## 2021-05-22 DIAGNOSIS — L03113 Cellulitis of right upper limb: Secondary | ICD-10-CM | POA: Diagnosis present

## 2021-05-22 DIAGNOSIS — Z888 Allergy status to other drugs, medicaments and biological substances status: Secondary | ICD-10-CM | POA: Diagnosis not present

## 2021-05-22 DIAGNOSIS — M1A9XX1 Chronic gout, unspecified, with tophus (tophi): Secondary | ICD-10-CM | POA: Diagnosis present

## 2021-05-22 DIAGNOSIS — I2510 Atherosclerotic heart disease of native coronary artery without angina pectoris: Secondary | ICD-10-CM | POA: Diagnosis present

## 2021-05-22 DIAGNOSIS — E785 Hyperlipidemia, unspecified: Secondary | ICD-10-CM | POA: Diagnosis present

## 2021-05-22 DIAGNOSIS — N400 Enlarged prostate without lower urinary tract symptoms: Secondary | ICD-10-CM | POA: Diagnosis present

## 2021-05-22 DIAGNOSIS — I472 Ventricular tachycardia: Secondary | ICD-10-CM | POA: Diagnosis present

## 2021-05-22 DIAGNOSIS — N1832 Chronic kidney disease, stage 3b: Secondary | ICD-10-CM | POA: Diagnosis present

## 2021-05-22 DIAGNOSIS — K219 Gastro-esophageal reflux disease without esophagitis: Secondary | ICD-10-CM | POA: Diagnosis present

## 2021-05-22 DIAGNOSIS — I130 Hypertensive heart and chronic kidney disease with heart failure and stage 1 through stage 4 chronic kidney disease, or unspecified chronic kidney disease: Secondary | ICD-10-CM | POA: Diagnosis present

## 2021-05-22 DIAGNOSIS — I482 Chronic atrial fibrillation, unspecified: Secondary | ICD-10-CM | POA: Diagnosis present

## 2021-05-22 DIAGNOSIS — Z823 Family history of stroke: Secondary | ICD-10-CM | POA: Diagnosis not present

## 2021-05-22 DIAGNOSIS — L0390 Cellulitis, unspecified: Secondary | ICD-10-CM | POA: Diagnosis present

## 2021-05-22 DIAGNOSIS — I493 Ventricular premature depolarization: Secondary | ICD-10-CM | POA: Diagnosis present

## 2021-05-22 LAB — CBC
HCT: 38.3 % — ABNORMAL LOW (ref 39.0–52.0)
Hemoglobin: 12.6 g/dL — ABNORMAL LOW (ref 13.0–17.0)
MCH: 27.5 pg (ref 26.0–34.0)
MCHC: 32.9 g/dL (ref 30.0–36.0)
MCV: 83.6 fL (ref 80.0–100.0)
Platelets: 220 10*3/uL (ref 150–400)
RBC: 4.58 MIL/uL (ref 4.22–5.81)
RDW: 16.2 % — ABNORMAL HIGH (ref 11.5–15.5)
WBC: 10.5 10*3/uL (ref 4.0–10.5)
nRBC: 0 % (ref 0.0–0.2)

## 2021-05-22 LAB — BASIC METABOLIC PANEL
Anion gap: 11 (ref 5–15)
BUN: 31 mg/dL — ABNORMAL HIGH (ref 8–23)
CO2: 19 mmol/L — ABNORMAL LOW (ref 22–32)
Calcium: 9 mg/dL (ref 8.9–10.3)
Chloride: 108 mmol/L (ref 98–111)
Creatinine, Ser: 1.6 mg/dL — ABNORMAL HIGH (ref 0.61–1.24)
GFR, Estimated: 43 mL/min — ABNORMAL LOW (ref >60)
Glucose, Bld: 126 mg/dL — ABNORMAL HIGH (ref 70–99)
Potassium: 3.9 mmol/L (ref 3.5–5.1)
Sodium: 138 mmol/L (ref 135–145)

## 2021-05-22 LAB — PROCALCITONIN: Procalcitonin: 0.35 ng/mL

## 2021-05-22 LAB — SEDIMENTATION RATE: Sed Rate: 56 mm/hr — ABNORMAL HIGH (ref 0–20)

## 2021-05-22 NOTE — Progress Notes (Addendum)
[facility] hospitalist note-no charge  Received secure chat from nursing staff the patient had V. tach and PVCs on telemetry.  At bedside patient is sleeping and easily arousable and denies chest pain and shortness of breath.  Vitals: Respiration rate 22, heart rate 97, blood pressure 127/93 SPO2 of 94% on room air.  Reviewed EKG post V. tach, which has been uploaded via chart review under media and was interpreted myself as negative for ST-T wave changes, positive for occasional PVC, was positive for atrial fibrillation with rate of 117 QTc 476.  Dr. Tobie Poet

## 2021-05-22 NOTE — TOC Progression Note (Signed)
Transition of Care [facility]) - Progression Note    Patient Details  Name: ACETON MACEDA MRN: [id number] Date of Birth: 02-03-1941  Transition of Care [facility]) CM/SW [place], RN Phone Number: 05/22/2021, 1:36 PM  Clinical Narrative:   Patient lives at home with his wife, states he has a supportive family who can assist him with his needs.  He has no concerns about transportation to appointments or obtaining medications.  Mr. Chau states he takes his medications as directed.  Patient states he walks with a cane, which he has at home.  He does not have any home health services or oxygen.  He states he does not have any needs that would require TOC at this time, TOC contact information given and patient was encouraged to call with any needs.         Expected Discharge Plan and Services                                                 Social Determinants of Health (SDOH) Interventions    Readmission Risk Interventions No flowsheet data found.

## 2021-05-22 NOTE — ED Notes (Signed)
Pt requested to use bedside urinal but was unsuccessful, pt had incontinent episode in depends, male purewick trialed at this time due to inverted penis and male purewick not working. NAD noted. Pt denies any further needs. Call bell in reach.

## 2021-05-22 NOTE — Progress Notes (Signed)
PROGRESS NOTE  GARVICE GURNEE  DOB: [birth year]  PCP: Denton Lank, MD [id number]  DOA: 05/21/2021  LOS: 0 days  Hospital Day: 2   Chief Complaint  Patient presents with   Arm Swelling    Brief narrative: George Bolton is a 80 y.o. male with PMH significant for HTN, HLD, A. fib, gout.   Patient presented to the ED on 7/27 with concern of right forearm pain and swelling for last 2 days without trauma, fever and no other associated symptoms.    In the ED, patient was noted to be in A. fib with RVR  Was found to have cellulitis of the right forearm from elbow to the hand X-ray of right elbow and hand were obtained.  X-ray of right elbow showed marked severity soft tissue swelling without any bony abnormality. X-ray of right hand showed diffuse soft tissue swelling and chronic changes. Labs with WBC count 14.8, lactic acid 1.3, serum creatinine 1.6 Urinalysis showed leukocytosis. COVID PCR negative Admitted to hospitalist service.  Subjective: Patient was seen and examined this morning.  Pleasant elderly African-American male, propped up in bed.  Not in distress.  Pain improving.  Assessment/Plan: Right upper extremity cellulitis -Presented with swelling and pain of right forearm for 2 days -Mildly elevated temperature 99.2, WBC count elevated to 14.8, tachycardia likely due to A. fib with RVR.  Does not meet sepsis criteria on admission. -Right upper extremity x-ray findings as above with marked soft tissue swelling and no bony abnormalities or fluid collection.  Currently on IV Rocephin. Recent Labs  Lab 05/21/21 1616 05/22/21 0621  WBC 14.8* 10.5  LATICACIDVEN 1.0  --   PROCALCITON 0.34 0.35   History of gout Chronic tophaceous gouty arthritis -The distal interphalangeal joint of the right third finger apparently is chronically swollen per patient.  He denies chronic swelling of right forearm though. -Continue colchicine 0.6 mg daily  Chronic A. fib  A. fib with  RVR -Likely due to cellulitis. -Home meds include metoprolol tartrate 50 mg twice daily and Eliquis 2.5 mg twice daily -Continue both.  Currently not requiring Cardizem drip.  Continue to monitor in telemetry  Chronic diastolic CHF Essential hypertension -Echo from 2019 with EF 70 to 75%, moderate LVH  -Home meds include Lopressor 50 mg twice daily, clonidine 0.1 mg twice daily, felodipine 10 mg daily, Imdur 30 mg twice daily, hydralazine 25 mg 3 times daily, Lasix 40 mg daily as needed -Continue all except Lasix.  Continue to monitor blood pressure.  ??  Cardiovascular disease Hyperlipidemia -Continue aspirin 325 mg daily, Lipitor 40 mg daily,  CKD 3b -Creatinine remains at baseline. Recent Labs    09/24/20 1510 01/03/21 1638 02/15/21 0633 05/21/21 1616 05/22/21 0621  BUN 33* 25* 22 30* 31*  CREATININE 1.77* 1.60* 1.70* 1.60* 1.60*   GERD  -Pepcid 20 mg twice daily,  BPH -Continue finasteride 5 mg daily, Flomax 0.4 mg daily. -Norco 5 mg every 6 as needed.  Mobility: Encourage ambulation.  Per patient, he ambulates with a cane at home. Code Status:   Code Status: Full Code  Nutritional status: Body mass index is 25.09 kg/m.     Diet:  Diet Order             Diet heart healthy/carb modified Room service appropriate? Yes; Fluid consistency: Thin  Diet effective now                  DVT prophylaxis:  apixaban (ELIQUIS) tablet 2.5 mg  Start: 05/21/21 2245 Place TED hose Start: 05/21/21 2104 apixaban (ELIQUIS) tablet 2.5 mg   Antimicrobials: IV Rocephin Fluid: None Consultants: None Family Communication:d/w patiient's son at bedside.  Status is: Inpatient  Remains inpatient appropriate because: Needs IV antibiotics, monitoring  Dispo: The patient is from: Home              Anticipated d/c is to: Pending PT              Patient currently is not medically stable to d/c.   Difficult to place patient No     Infusions:   cefTRIAXone (ROCEPHIN)  IV  Stopped (05/22/21 1123)    Scheduled Meds:  apixaban  2.5 mg Oral BID   aspirin  325 mg Oral Daily   atorvastatin  40 mg Oral QHS   cloNIDine  0.1 mg Oral BID   colchicine  0.6 mg Oral Daily   famotidine  20 mg Oral BID   felodipine  10 mg Oral Daily   finasteride  5 mg Oral Daily   hydrALAZINE  25 mg Oral TID   isosorbide mononitrate  30 mg Oral Daily   metoprolol tartrate  50 mg Oral BID   tamsulosin  0.4 mg Oral Daily    Antimicrobials: Anti-infectives (From admission, onward)    Start     Dose/Rate Route Frequency Ordered Stop   05/22/21 1000  cefTRIAXone (ROCEPHIN) 1 g in sodium chloride 0.9 % 100 mL IVPB        1 g 200 mL/hr over 30 Minutes Intravenous Every 24 hours 05/21/21 2105 05/29/21 0959   05/21/21 1915  cefTRIAXone (ROCEPHIN) 1 g in sodium chloride 0.9 % 100 mL IVPB        1 g 200 mL/hr over 30 Minutes Intravenous  Once 05/21/21 1902 05/21/21 2004   05/21/21 1915  vancomycin (VANCOREADY) IVPB 1750 mg/350 mL        1,750 mg 175 mL/hr over 120 Minutes Intravenous  Once 05/21/21 1913 05/21/21 2211       PRN meds: acetaminophen **OR** acetaminophen, ondansetron **OR** ondansetron (ZOFRAN) IV   Objective: Vitals:   05/22/21 0900 05/22/21 1033  BP: (!) 135/94 114/79  Pulse: (!) 103 88  Resp: (!) 22 16  Temp:  97.6 F (36.4 C)  SpO2: 96% 96%    Intake/Output Summary (Last 24 hours) at 05/22/2021 1125 Last data filed at 05/21/2021 2211 Gross per 24 hour  Intake 1447.83 ml  Output --  Net 1447.83 ml   Filed Weights   05/21/21 1603  Weight: 83.9 kg   Weight change:  Body mass index is 25.09 kg/m.   Physical Exam: General exam: Pleasant, elderly African-American male.  Not in distress Skin: No rashes, lesions or ulcers. HEENT: Atraumatic, normocephalic, no obvious bleeding Lungs: Clear to auscultation bilaterally CVS: Regular rate and rhythm, no murmur GI/Abd soft, nontender, nondistended, bowel sound present CNS: Alert, awake, oriented to place  and person Psychiatry: Mood appropriate Extremities: No edema, no calf tenderness.  Right forearm swollen from elbow to hand.  Chronic tophaceous swelling of distal interphalangeal joint of right third finger.  Data Review: I have personally reviewed the laboratory data and studies available.  Recent Labs  Lab 05/21/21 1616 05/22/21 0621  WBC 14.8* 10.5  NEUTROABS 11.7*  --   HGB 13.0 12.6*  HCT 39.0 38.3*  MCV 83.2 83.6  PLT 270 220   Recent Labs  Lab 05/21/21 1616 05/22/21 0621  NA 139 138  K 4.2 3.9  CL 111 108  CO2 20* 19*  GLUCOSE 125* 126*  BUN 30* 31*  CREATININE 1.60* 1.60*  CALCIUM 9.5 9.0    F/u labs ordered Unresulted Labs (From admission, onward)     Start     Ordered   05/23/21 0500  CBC with Differential/Platelet  Tomorrow morning,   R        05/22/21 1125   05/23/21 [id number]  Basic metabolic panel  Tomorrow morning,   R        05/22/21 1125   05/22/21 0500  Procalcitonin  Daily,   STAT      05/21/21 2113            Signed, Terrilee Croak, MD [facility] 05/22/2021

## 2021-05-22 NOTE — Evaluation (Signed)
Physical Therapy Evaluation Patient Details Name: George Bolton MRN: [id number] DOB: 1940-11-20 Today's Date: 05/22/2021   History of Present Illness  80 y.o. male presents to the emergency department for chief concerns of right arm pain. Medical history significant for atrial fibrillation, hypertension, gout, hyperlipidemia.  Clinical Impression  Patient supine in bed; alert and oriented (some confusion with place) and agreeable to participate with therapy. He denies all pain at rest however endorses generalized pain throughout RUE with all movements and external pressure. Significant weakness and decreased active/passive ROM noted in RUE with general weakness in LUE as well. 4+/5 strength throughout BLE. Currently patient is requiring SUP-CGA with all bed mobility, transfers and ambulation. (Rolling required MIN A). He ambulated 11f x2 reps using RW during ambulatory toilet transfer; gait deficits include decreased foot clearance bilaterally and decreased cadence. States he typically uses SPC at home although he does own a RW. He demo significant kyphosis in sitting and standing. When discussing d/c options, pt states he is doing better than he was a couple weeks ago. He also states he has plenty of family present to assist with caregiving. Would benefit from skilled PT to address above deficits and promote optimal return to PLOF.; Recommend transition to HHPT upon discharge from acute hospitalization.       Follow Up Recommendations Home health PT (Pt stated "I can do it myself" - may refuse HHPT.)    Equipment Recommendations       Recommendations for Other Services       Precautions / Restrictions Precautions Precautions: Fall Restrictions Weight Bearing Restrictions: No      Mobility  Bed Mobility Overal bed mobility: Needs Assistance Bed Mobility: Supine to Sit;Sit to Supine;Rolling Rolling: Min assist   Supine to sit: HOB elevated;Supervision Sit to supine:  Supervision   General bed mobility comments: Increased time with all, using bed rails. MIN A to obtain full sidelying, pt initiated movement and achieved partial range.    Transfers Overall transfer level: Needs assistance Equipment used: Rolling walker (2 wheeled) Transfers: Sit to/from Stand Sit to Stand: Min guard         General transfer comment: CGA, increased time, from elevated surface (both bed and BSC)  Ambulation/Gait Ambulation/Gait assistance: Min guard Gait Distance (Feet): 12 Feet Assistive device: Rolling walker (2 wheeled) Gait Pattern/deviations: Step-through pattern;Decreased stride length;Trunk flexed Gait velocity: decreased   General Gait Details: 167fx 2 reps for ambulatory toilet transfer. Difficulty with B foot clearance, especially left with VC to correct. CGA to steady when pt had difficulty clearing foot  Stairs            Wheelchair Mobility    Modified Rankin (Stroke Patients Only)       Balance Overall balance assessment: Mild deficits observed, not formally tested;Needs assistance Sitting-balance support: No upper extremity supported Sitting balance-Leahy Scale: Fair Sitting balance - Comments: During MMT; assist to correct to upright Postural control: Posterior lean Standing balance support: Bilateral upper extremity supported;During functional activity Standing balance-Leahy Scale: Fair                               Pertinent Vitals/Pain Pain Assessment: 0-10 Pain Score: 4  Pain Location: RUE -  denies pain at rest, 4/5 with mobility or external pressure Pain Descriptors / Indicators: Aching Pain Intervention(s): Limited activity within patient's tolerance;Monitored during session;Repositioned    Home Living Family/patient expects to be discharged to:: Private residence Living  Arrangements: Spouse/significant other;Children Available Help at Discharge: Family;Available 24 hours/day Type of Home: House Home  Access: Stairs to enter Entrance Stairs-Rails: Left Entrance Stairs-Number of Steps: 3 Home Layout: One level Home Equipment: Walker - 2 wheels;Cane - single point;Bedside commode      Prior Function Level of Independence: Needs assistance   Gait / Transfers Assistance Needed: walks with SPC, Mod I  ADL's / Homemaking Assistance Needed: wife and kids assist  Comments: States he is independent in all ADLs; does not perform IADLs     Hand Dominance   Dominant Hand: Right    Extremity/Trunk Assessment   Upper Extremity Assessment Upper Extremity Assessment: Generalized weakness (BUE. RUE weaker than LUE. Significant limitation in RUE ROM through elbow, wrist and hand. Reports normal sensation to light touch.)    Lower Extremity Assessment Lower Extremity Assessment: Overall WFL for tasks assessed (4+/5 in BLE)    Cervical / Trunk Assessment Cervical / Trunk Assessment: Kyphotic  Communication   Communication: No difficulties  Cognition Arousal/Alertness: Awake/alert Behavior During Therapy: WFL for tasks assessed/performed Overall Cognitive Status: Within Functional Limits for tasks assessed                                 General Comments: Delayed response time to all questions. Answered he was at home when asking orientation questions - once told he was in the hospital, he knew which hospital he was at. A&O x3      General Comments      Exercises Other Exercises Other Exercises: Educated on PT role, POC, safety with mobilization. Toileting performed with increased time spent on commode, significant kyphotic posture in both sitting and standing. Assist with pericare. Also assisted with donning brief sone back to bed.   Assessment/Plan    PT Assessment Patient needs continued PT services  PT Problem List Decreased mobility;Decreased safety awareness;Decreased range of motion;Decreased activity tolerance;Decreased balance;Pain       PT Treatment  Interventions DME instruction;Therapeutic activities;Gait training;Therapeutic exercise;Patient/family education;Balance training;Functional mobility training;Neuromuscular re-education    PT Goals (Current goals can be found in the Care Plan section)  Acute Rehab PT Goals Patient Stated Goal: To get back to doing what I was doing. PT Goal Formulation: With patient Time For Goal Achievement: 06/05/21    Frequency Min 2X/week   Barriers to discharge   N/A    Co-evaluation               AM-PAC PT "6 Clicks" Mobility  Outcome Measure Help needed turning from your back to your side while in a flat bed without using bedrails?: A Little Help needed moving from lying on your back to sitting on the side of a flat bed without using bedrails?: A Little Help needed moving to and from a bed to a chair (including a wheelchair)?: A Little Help needed standing up from a chair using your arms (e.g., wheelchair or bedside chair)?: A Little Help needed to walk in hospital room?: A Little Help needed climbing 3-5 steps with a railing? : A Lot 6 Click Score: 17    End of Session Equipment Utilized During Treatment: Gait belt Activity Tolerance: Patient tolerated treatment well;Patient limited by fatigue Patient left: in bed;with call bell/phone within reach;with bed alarm set Nurse Communication: Mobility status PT Visit Diagnosis: Unsteadiness on feet (R26.81);Other abnormalities of gait and mobility (R26.89);Muscle weakness (generalized) (M62.81)    Time:  -  Charges:   PT Evaluation $PT Eval Moderate Complexity: 1 Mod PT Treatments $Therapeutic Activity: 23-37 mins       Patrina Levering PT, DPT 05/22/21 3:46 PM [id number]   Ramonita Lab 05/22/2021, 3:46 PM

## 2021-05-23 DIAGNOSIS — L03113 Cellulitis of right upper limb: Secondary | ICD-10-CM | POA: Diagnosis not present

## 2021-05-23 LAB — BASIC METABOLIC PANEL
Anion gap: 7 (ref 5–15)
BUN: 33 mg/dL — ABNORMAL HIGH (ref 8–23)
CO2: 21 mmol/L — ABNORMAL LOW (ref 22–32)
Calcium: 8.7 mg/dL — ABNORMAL LOW (ref 8.9–10.3)
Chloride: 110 mmol/L (ref 98–111)
Creatinine, Ser: 1.57 mg/dL — ABNORMAL HIGH (ref 0.61–1.24)
GFR, Estimated: 44 mL/min — ABNORMAL LOW (ref >60)
Glucose, Bld: 120 mg/dL — ABNORMAL HIGH (ref 70–99)
Potassium: 3.9 mmol/L (ref 3.5–5.1)
Sodium: 138 mmol/L (ref 135–145)

## 2021-05-23 LAB — CBC WITH DIFFERENTIAL/PLATELET
Abs Immature Granulocytes: 0.03 10*3/uL (ref 0.00–0.07)
Basophils Absolute: 0 10*3/uL (ref 0.0–0.1)
Basophils Relative: 0 %
Eosinophils Absolute: 0 10*3/uL (ref 0.0–0.5)
Eosinophils Relative: 0 %
HCT: 35.9 % — ABNORMAL LOW (ref 39.0–52.0)
Hemoglobin: 11.7 g/dL — ABNORMAL LOW (ref 13.0–17.0)
Immature Granulocytes: 0 %
Lymphocytes Relative: 16 %
Lymphs Abs: 1.2 10*3/uL (ref 0.7–4.0)
MCH: 27.1 pg (ref 26.0–34.0)
MCHC: 32.6 g/dL (ref 30.0–36.0)
MCV: 83.1 fL (ref 80.0–100.0)
Monocytes Absolute: 0.6 10*3/uL (ref 0.1–1.0)
Monocytes Relative: 8 %
Neutro Abs: 5.9 10*3/uL (ref 1.7–7.7)
Neutrophils Relative %: 76 %
Platelets: 233 10*3/uL (ref 150–400)
RBC: 4.32 MIL/uL (ref 4.22–5.81)
RDW: 16 % — ABNORMAL HIGH (ref 11.5–15.5)
WBC: 7.9 10*3/uL (ref 4.0–10.5)
nRBC: 0 % (ref 0.0–0.2)

## 2021-05-23 LAB — PROCALCITONIN: Procalcitonin: 0.27 ng/mL

## 2021-05-23 MED ORDER — DILTIAZEM HCL 25 MG/5ML IV SOLN
15.0000 mg | Freq: Once | INTRAVENOUS | Status: AC
  Administered 2021-05-23: 09:00:00 15 mg via INTRAVENOUS
  Filled 2021-05-23: qty 5

## 2021-05-23 MED ORDER — METOPROLOL TARTRATE 50 MG PO TABS
75.0000 mg | ORAL_TABLET | Freq: Two times a day (BID) | ORAL | Status: DC
  Administered 2021-05-23 (×2): 75 mg via ORAL
  Filled 2021-05-23 (×2): qty 1

## 2021-05-23 NOTE — Plan of Care (Signed)
  Problem: Safety: Goal: Ability to remain free from injury will improve Outcome: Not Progressing  Transfers x 1 assist with rolling walker, continent of bladder, no transfers at this time,

## 2021-05-23 NOTE — Progress Notes (Signed)
PROGRESS NOTE  George Bolton  DOB: June 30, 1941  PCP: Denton Lank, MD [id number]  DOA: 05/21/2021  LOS: 1 day  Hospital Day: 3   Chief Complaint  Patient presents with   Arm Swelling    Brief narrative: George Bolton is a 80 y.o. male with PMH significant for HTN, HLD, A. fib, gout.   Patient presented to the ED on 7/27 with concern of right forearm pain and swelling for last 2 days without trauma, fever and no other associated symptoms.    In the ED, patient was noted to be in A. fib with RVR  Was found to have cellulitis of the right forearm from elbow to the hand X-ray of right elbow and hand were obtained.  X-ray of right elbow showed marked severity soft tissue swelling without any bony abnormality. X-ray of right hand showed diffuse soft tissue swelling and chronic changes. Labs with WBC count 14.8, lactic acid 1.3, serum creatinine 1.6 Urinalysis showed leukocytosis. COVID PCR negative Admitted to hospitalist service.  Subjective: Patient was seen and examined this morning.   Pleasant elderly African-American male, propped up in bed.  Not in distress.   Right forearm pain and swelling improving. This morning, he was in A. fib with RVR up to 160.  Improved with a bolus of IV Cardizem and morning dose of oral metoprolol.  Assessment/Plan: Right upper extremity cellulitis -Presented with swelling and pain of right forearm for 2 days -Sepsis ruled out. -Right upper extremity x-ray findings as above with marked soft tissue swelling and no bony abnormalities or fluid collection.  Currently improving on IV Rocephin. -WBC count improving.  History of gout Chronic tophaceous gouty arthritis -The distal interphalangeal joint of the right third finger apparently is chronically swollen per patient.  He denies chronic swelling of right forearm though. -Continue colchicine 0.6 mg daily  Chronic A. fib  A. fib with RVR -Likely due to cellulitis. -A. fib with RVR this  morning again.  Improved with IV Cardizem push 50 mg once.  I increased his home dose of metoprolol tartrate from 50 mg twice daily to 75 mg twice daily.  Continue Eliquis 2.5 mg twice daily -Continue to monitor in telemetry.  Chronic diastolic CHF Essential hypertension -Echo from 2019 with EF 70 to 75%, moderate LVH  -Home meds include Lopressor 50 mg twice daily, clonidine 0.1 mg twice daily, felodipine 10 mg daily, Imdur 30 mg twice daily, hydralazine 25 mg 3 times daily, Lasix 40 mg daily as needed -I double checked with his son this morning if patient was taking all his medications at home.  Patient stated that patient is noncompliant and picks and chooses randomly. -Blood pressure running in low normal range.  Continue clonidine and increase dose of metoprolol.  Keep others on hold.  ??  Cardiovascular disease Hyperlipidemia -Continue aspirin 325 mg daily, Lipitor 40 mg daily,  CKD 3b -Creatinine remains at baseline. Recent Labs    09/24/20 1510 01/03/21 1638 02/15/21 0633 05/21/21 1616 05/22/21 0621 05/23/21 0513  BUN 33* 25* 22 30* 31* 33*  CREATININE 1.77* 1.60* 1.70* 1.60* 1.60* 1.57*   GERD  -Pepcid 20 mg twice daily,  BPH -Continue finasteride 5 mg daily. -Norco 5 mg every 6 as needed.  Mobility: Encourage ambulation.  Per patient, he ambulates with a cane at home. Code Status:   Code Status: Full Code  Nutritional status: Body mass index is 25.09 kg/m.     Diet:  Diet Order  Diet heart healthy/carb modified Room service appropriate? Yes; Fluid consistency: Thin  Diet effective now                  DVT prophylaxis:  apixaban (ELIQUIS) tablet 2.5 mg Start: 05/21/21 2245 Place TED hose Start: 05/21/21 2104 apixaban (ELIQUIS) tablet 2.5 mg   Antimicrobials: IV Rocephin Fluid: None Consultants: None Family Communication:d/w patient's son on the phone  Status is: Inpatient  Remains inpatient appropriate because: Needs IV antibiotics,  cardiac monitoring  Dispo: The patient is from: Home              Anticipated d/c is to: PT eval obtained.  Home with PT recommended.              Patient currently is not medically stable to d/c.   Difficult to place patient No     Infusions:   cefTRIAXone (ROCEPHIN)  IV 1 g (05/23/21 0901)    Scheduled Meds:  apixaban  2.5 mg Oral BID   aspirin  325 mg Oral Daily   atorvastatin  40 mg Oral QHS   cloNIDine  0.1 mg Oral BID   colchicine  0.6 mg Oral Daily   famotidine  20 mg Oral BID   finasteride  5 mg Oral Daily   metoprolol tartrate  75 mg Oral BID    Antimicrobials: Anti-infectives (From admission, onward)    Start     Dose/Rate Route Frequency Ordered Stop   05/22/21 1000  cefTRIAXone (ROCEPHIN) 1 g in sodium chloride 0.9 % 100 mL IVPB        1 g 200 mL/hr over 30 Minutes Intravenous Every 24 hours 05/21/21 2105 05/29/21 0959   05/21/21 1915  cefTRIAXone (ROCEPHIN) 1 g in sodium chloride 0.9 % 100 mL IVPB        1 g 200 mL/hr over 30 Minutes Intravenous  Once 05/21/21 1902 05/21/21 2004   05/21/21 1915  vancomycin (VANCOREADY) IVPB 1750 mg/350 mL        1,750 mg 175 mL/hr over 120 Minutes Intravenous  Once 05/21/21 1913 05/21/21 2211       PRN meds: acetaminophen **OR** acetaminophen, ondansetron **OR** ondansetron (ZOFRAN) IV   Objective: Vitals:   05/23/21 0837 05/23/21 1121  BP: 134/78 104/75  Pulse:  (!) 106  Resp:  18  Temp:  98 F (36.7 C)  SpO2:  99%    Intake/Output Summary (Last 24 hours) at 05/23/2021 1431 Last data filed at 05/22/2021 1845 Gross per 24 hour  Intake 340 ml  Output 400 ml  Net -60 ml   Filed Weights   05/21/21 1603  Weight: 83.9 kg   Weight change:  Body mass index is 25.09 kg/m.   Physical Exam: General exam: Pleasant, elderly African-American male.  Not in distress Skin: No rashes, lesions or ulcers. HEENT: Atraumatic, normocephalic, no obvious bleeding Lungs: Clear to auscultation bilaterally CVS: Rapid A.  fib, no murmur GI/Abd soft, nontender, nondistended, bowel sound present CNS: Alert, awake, oriented to place and person Psychiatry: Mood appropriate Extremities: No edema, no calf tenderness.  Right forearm pain and swelling improving.  Chronic tophaceous swelling of distal interphalangeal joint of right third finger.  Data Review: I have personally reviewed the laboratory data and studies available.  Recent Labs  Lab 05/21/21 1616 05/22/21 0621 05/23/21 0513  WBC 14.8* 10.5 7.9  NEUTROABS 11.7*  --  5.9  HGB 13.0 12.6* 11.7*  HCT 39.0 38.3* 35.9*  MCV 83.2 83.6 83.1  PLT 270  220 233   Recent Labs  Lab 05/21/21 1616 05/22/21 0621 05/23/21 0513  NA 139 138 138  K 4.2 3.9 3.9  CL 111 108 110  CO2 20* 19* 21*  GLUCOSE 125* 126* 120*  BUN 30* 31* 33*  CREATININE 1.60* 1.60* 1.57*  CALCIUM 9.5 9.0 8.7*    F/u labs ordered Unresulted Labs (From admission, onward)    None       Signed, Terrilee Croak, MD [facility] 05/23/2021

## 2021-05-23 NOTE — Significant Event (Signed)
Rapid Response Event Note   Reason for Call : RRT called for Afib/RVR (pt with hx of afib)   Initial Focused Assessment: lying in bed, alert, awake, answers questions appropriately. HR 140's- 170's, see vs flowsheet      Interventions: Orders from Dr Pietro Cassis 15 mg Cardizem IV push, and to go ahead and give morning meds.   Plan of Care: as above, RN Raquel Sarna to call for further assistance.    Event Summary:   MD Notified: Dr Pietro Cassis [phone] Call Weston, RN

## 2021-05-23 NOTE — Progress Notes (Signed)
PT Cancellation Note  Patient Details Name: George Bolton MRN: [id number] DOB: 05/09/1941   Cancelled Treatment:    Reason Eval/Treat Not Completed: Other (comment). Chart reviewed. PT checked in with RN regarding Rapid Response called on pt his morning; RN states tachycardia has resolved. Pt received sitting up in recliner, wrapped up in multiple blankets. He pleasantly refused therapy, stating he was very comfortable and warm and did not want to get up. Will attempt PT treatment at a later date.  Patrina Levering PT, DPT 05/23/21 3:09 PM [id number]  Ramonita Lab 05/23/2021, 3:06 PM

## 2021-05-23 NOTE — Progress Notes (Signed)
Notified Dr. Pietro Cassis of tele calling me about 5 run of [facility] at 1023. He just stated to monitor patient. Notified again by tele that patient had another 6 beat run of [facility], notified Dr. Pietro Cassis at [phone] regarding issue and informed him of heart rate currently. Patient resting in bedside recliner with call bell in reach.

## 2021-05-24 ENCOUNTER — Inpatient Hospital Stay: Admit: 2021-05-24 | Payer: Medicare Other

## 2021-05-24 DIAGNOSIS — L03113 Cellulitis of right upper limb: Secondary | ICD-10-CM | POA: Diagnosis not present

## 2021-05-24 MED ORDER — CEFDINIR 300 MG PO CAPS: 300.0000 mg | ORAL_CAPSULE | Freq: Two times a day (BID) | ORAL | 0 refills | Status: AC

## 2021-05-24 MED ORDER — METOPROLOL TARTRATE 100 MG PO TABS: 100.0000 mg | ORAL_TABLET | Freq: Two times a day (BID) | ORAL | 2 refills | Status: DC

## 2021-05-24 MED ORDER — METOPROLOL TARTRATE 50 MG PO TABS
100.0000 mg | ORAL_TABLET | Freq: Two times a day (BID) | ORAL | Status: DC
  Administered 2021-05-24: 10:00:00 100 mg via ORAL
  Filled 2021-05-24: qty 2

## 2021-05-24 MED ORDER — ISOSORBIDE MONONITRATE ER 30 MG PO TB24
30.0000 mg | ORAL_TABLET | Freq: Every day | ORAL | Status: DC
  Administered 2021-05-24: 30 mg via ORAL
  Filled 2021-05-24: qty 1

## 2021-05-24 MED ORDER — FUROSEMIDE 40 MG PO TABS: 40.0000 mg | ORAL_TABLET | Freq: Every day | ORAL | Status: DC | PRN

## 2021-05-24 MED ORDER — SACCHAROMYCES BOULARDII 250 MG PO CAPS: 250.0000 mg | ORAL_CAPSULE | Freq: Two times a day (BID) | ORAL | 0 refills | Status: AC

## 2021-05-24 MED ORDER — CEFDINIR 300 MG PO CAPS
300.0000 mg | ORAL_CAPSULE | Freq: Two times a day (BID) | ORAL | Status: DC
  Administered 2021-05-24: 10:00:00 300 mg via ORAL
  Filled 2021-05-24 (×2): qty 1

## 2021-05-24 MED ORDER — ISOSORBIDE MONONITRATE ER 30 MG PO TB24: 30.0000 mg | ORAL_TABLET | Freq: Every day | ORAL | 17 refills | Status: DC

## 2021-05-24 NOTE — Discharge Summary (Signed)
Physician Discharge Summary  George Bolton DOB: May 26, 1941 DOA: 05/21/2021  PCP: Denton Lank, MD  Admit date: 05/21/2021 Discharge date: 05/24/2021  Admitted From: Home Discharge disposition: Home    Code Status: Full Code   Discharge Diagnosis:   Principal Problem:   Cellulitis Active Problems:   CHF (congestive heart failure) ([place])   Gout   Hypertension   Atrial fibrillation (HCC)   Chronic diastolic congestive heart failure (HCC)   Atrial fibrillation with RVR ([place])   CKD (chronic kidney disease), stage III ([place])   Sepsis [facility])    Chief Complaint  Patient presents with   Arm Swelling    Brief narrative: George Bolton is a 80 y.o. male with PMH significant for HTN, HLD, A. fib, gout.   Patient presented to the ED on 7/27 with concern of right forearm pain and swelling for last 2 days without trauma, fever and no other associated symptoms.    In the ED, patient was noted to be in A. fib with RVR  Was found to have cellulitis of the right forearm from elbow to the hand X-ray of right elbow and hand were obtained.  X-ray of right elbow showed marked severity soft tissue swelling without any bony abnormality. X-ray of right hand showed diffuse soft tissue swelling and chronic changes. Labs with WBC count 14.8, lactic acid 1.3, serum creatinine 1.6 Urinalysis showed leukocytosis. COVID PCR negative Admitted to hospitalist service.  Subjective: Patient was seen and examined this morning.   Lying on bed.  Not in distress.  Right forearm swelling and pain much better. In last 24 hours, he is heart rate and blood pressure have been better controlled. Feels ready to go home today.  Hospital course Right upper extremity cellulitis -Presented with swelling and pain of right forearm for 2 days -Sepsis ruled out. -Right upper extremity x-ray findings as above with marked soft tissue swelling and no bony abnormalities or fluid collection. Currently  improving on IV Rocephin. -No fever, WBC count improving. -Discharge on 5 more days of Omnicef with probiotics.  History of gout Chronic tophaceous gouty arthritis -The distal interphalangeal joint of the right third finger apparently is chronically swollen per patient. He denies chronic swelling of right forearm though. -Continue colchicine 0.6 mg daily  Chronic A. fib  A. fib with RVR -Likely due to cellulitis. -Controlled with increment in dose of metoprolol to 100 mg twice daily.  Continue the same at home. -Continue Eliquis 2.5 mg twice daily -Continue to monitor in telemetry.  Chronic diastolic CHF Essential hypertension -Echo from 2019 with EF 70 to 75%, moderate LVH  -Patient's current Home meds include Lopressor 50 mg twice daily, clonidine 0.1 mg twice daily, felodipine 10 mg daily, Imdur 30 mg twice daily, hydralazine 25 mg 3 times daily, Lasix 40 mg daily as needed -I double checked with his son who stated that patient is noncompliant and picks and chooses randomly. -Medications adjusted.  Currently blood pressure is controlled on metoprolol 100 mg twice daily, clonidine 0.1 mg twice daily, Imdur 30 mg daily.  Okay to take Lasix 40 mg daily as needed for pedal edema.  ??  Cardiovascular disease Hyperlipidemia -Continue aspirin 325 mg daily, Lipitor 40 mg daily  CKD 3b -Creatinine remains at baseline. Recent Labs    09/24/20 1510 01/03/21 1638 02/15/21 0633 05/21/21 1616 05/22/21 0621 05/23/21 0513  BUN 33* 25* 22 30* 31* 33*  CREATININE 1.77* 1.60* 1.70* 1.60* 1.60* 1.57*   GERD  -Pepcid  20 mg twice daily,  BPH -Continue finasteride 5 mg daily. -Norco 5 mg every 6 as needed.   Allergies as of 05/24/2021       Reactions   Cortisone Anaphylaxis   Triamcinolone    Other reaction(s): Unknown        Medication List     STOP taking these medications    cefadroxil 500 MG capsule Commonly known as: DURICEF   felodipine 10 MG 24 hr tablet Commonly  known as: PLENDIL   hydrALAZINE 25 MG tablet Commonly known as: APRESOLINE   predniSONE 10 MG (21) Tbpk tablet Commonly known as: STERAPRED UNI-PAK 21 TAB   tamsulosin 0.4 MG Caps capsule Commonly known as: FLOMAX       TAKE these medications    apixaban 2.5 MG Tabs tablet Commonly known as: ELIQUIS Take 2.5 mg by mouth 2 (two) times daily.   aspirin 325 MG tablet Take 325 mg by mouth daily.   atorvastatin 40 MG tablet Commonly known as: LIPITOR Take 1 tablet by mouth daily.   cefdinir 300 MG capsule Commonly known as: OMNICEF Take 1 capsule (300 mg total) by mouth every 12 (twelve) hours for 5 days.   cloNIDine 0.1 MG tablet Commonly known as: CATAPRES Take 1 tablet by mouth 2 (two) times daily.   colchicine 0.6 MG tablet Take 0.6 mg by mouth daily as needed (for gout flares).   diphenhydrAMINE-zinc acetate cream Commonly known as: BENADRYL Apply 1 application topically 2 (two) times daily as needed for itching.   famotidine 20 MG tablet Commonly known as: PEPCID Take 1 tablet (20 mg total) by mouth 2 (two) times daily.   finasteride 5 MG tablet Commonly known as: PROSCAR Take 1 tablet (5 mg total) by mouth daily.   furosemide 40 MG tablet Commonly known as: LASIX Take 1 tablet (40 mg total) by mouth daily as needed for fluid.   HYDROcodone-acetaminophen 5-325 MG tablet Commonly known as: NORCO/VICODIN Take one tablet at night for pain; may take up to every 6 hours as needed for pain if not working or driving   isosorbide mononitrate 30 MG 24 hr tablet Commonly known as: IMDUR Take 1 tablet (30 mg total) by mouth daily. What changed: when to take this   metoprolol tartrate 100 MG tablet Commonly known as: LOPRESSOR Take 1 tablet (100 mg total) by mouth 2 (two) times daily. What changed:  medication strength how much to take   nystatin powder Generic drug: nystatin APPLY  POWDER TOPICALLY TWICE DAILY   saccharomyces boulardii 250 MG  capsule Commonly known as: FLORASTOR Take 1 capsule (250 mg total) by mouth 2 (two) times daily for 5 days.        Discharge Instructions:  Diet Recommendation: Cardiac diet   Follow with Primary MD Denton Lank, MD in 7 days   Get CBC/BMP checked in next visit within 1 week by PCP or SNF MD ( we routinely change or add medications that can affect your baseline labs and fluid status, therefore we recommend that you get the mentioned basic workup next visit with your PCP, your PCP may decide not to get them or add new tests based on their clinical decision)  On your next visit with your PCP, please Get Medicines reviewed and adjusted.  Please request your PCP  to go over all Hospital Tests and Procedure/Radiological results at the follow up, please get all Hospital records sent to your Prim MD by signing hospital release before you go home.  Activity: As tolerated with Full fall precautions use walker/cane & assistance as needed  For Heart failure patients - Check your Weight same time everyday, if you gain over 2 pounds, or you develop in leg swelling, experience more shortness of breath or chest pain, call your Primary MD immediately. Follow Cardiac Low Salt Diet and 1.5 lit/day fluid restriction.  If you have smoked or chewed Tobacco in the last 2 yrs please stop smoking, stop any regular Alcohol  and or any Recreational drug use.  If you experience worsening of your admission symptoms, develop shortness of breath, life threatening emergency, suicidal or homicidal thoughts you must seek medical attention immediately by calling 911 or calling your MD immediately  if symptoms less severe.  You Must read complete instructions/literature along with all the possible adverse reactions/side effects for all the Medicines you take and that have been prescribed to you. Take any new Medicines after you have completely understood and accpet all the possible adverse reactions/side effects.   Do  not drive, operate heavy machinery, perform activities at heights, swimming or participation in water activities or provide baby sitting services if your were admitted for syncope or siezures until you have seen by Primary MD or a Neurologist and advised to do so again.  Do not drive when taking Pain medications.  Do not take more than prescribed Pain, Sleep and Anxiety Medications  Wear Seat belts while driving.   Please note You were cared for by a hospitalist during your hospital stay. If you have any questions about your discharge medications or the care you received while you were in the hospital after you are discharged, you can call the unit and asked to speak with the hospitalist on call if the hospitalist that took care of you is not available. Once you are discharged, your primary care physician will handle any further medical issues. Please note that NO REFILLS for any discharge medications will be authorized once you are discharged, as it is imperative that you return to your primary care physician (or establish a relationship with a primary care physician if you do not have one) for your aftercare needs so that they can reassess your need for medications and monitor your lab values.    Follow ups:    Follow-up Information     Denton Lank, MD Follow up.   Specialty: Family Medicine Contact information: [address] [address] [phone]         Minna Merritts, MD .   Specialty: Cardiology Contact information: [place] [address] [phone]                 Wound care:     Discharge Exam:   Vitals:   05/24/21 0100 05/24/21 0547 05/24/21 0845 05/24/21 1209  BP: (!) 124/94 (!) 145/88 (!) 137/95 118/87  Pulse: 93 98 93 88  Resp: '16 17 18 18  '$ Temp: 98 F (36.7 C) 98.1 F (36.7 C) 98.7 F (37.1 C) 97.6 F (36.4 C)  TempSrc: Oral     SpO2: 98% 100% 97% 98%  Weight:      Height:         Body mass index is 25.09 kg/m.  General exam: Pleasant, elderly African-American male.  Not in distress Skin: No rashes, lesions or ulcers. HEENT: Atraumatic, normocephalic, no obvious bleeding Lungs: Clear to auscultation laterally CVS: Rate controlled A. fib, no murmur GI/Abd soft, nontender, nondistended, bowel sound present CNS:  Alert, awake, oriented x3 Psychiatry: Mood appropriate Extremities: No pedal edema, no calf tenderness, much improved swelling and tenderness in right forearm.  Right elbow still is slightly swollen but improving.  Chronic swelling in the distal interphalangeal joint of the right middle finger.  Time coordinating discharge: 35 minutes   The results of significant diagnostics from this hospitalization (including imaging, microbiology, ancillary and laboratory) are listed below for reference.    Procedures and Diagnostic Studies:   No results found.   Labs:   Basic Metabolic Panel: Recent Labs  Lab 05/21/21 1616 05/22/21 0621 05/23/21 0513  NA 139 138 138  K 4.2 3.9 3.9  CL 111 108 110  CO2 20* 19* 21*  GLUCOSE 125* 126* 120*  BUN 30* 31* 33*  CREATININE 1.60* 1.60* 1.57*  CALCIUM 9.5 9.0 8.7*   GFR Estimated Creatinine Clearance: 39.8 mL/min (A) (by C-G formula based on SCr of 1.57 mg/dL (H)). Liver Function Tests: Recent Labs  Lab 05/21/21 1616  AST 15  ALT 8  ALKPHOS 64  BILITOT 1.1  PROT 7.3  ALBUMIN 3.1*   No results for input(s): LIPASE, AMYLASE in the last 168 hours. No results for input(s): AMMONIA in the last 168 hours. Coagulation profile No results for input(s): INR, PROTIME in the last 168 hours.  CBC: Recent Labs  Lab 05/21/21 1616 05/22/21 0621 05/23/21 0513  WBC 14.8* 10.5 7.9  NEUTROABS 11.7*  --  5.9  HGB 13.0 12.6* 11.7*  HCT 39.0 38.3* 35.9*  MCV 83.2 83.6 83.1  PLT 270 220 233   Cardiac Enzymes: No results for input(s): CKTOTAL, CKMB, CKMBINDEX, TROPONINI in the last 168 hours. BNP: Invalid  input(s): POCBNP CBG: No results for input(s): GLUCAP in the last 168 hours. D-Dimer No results for input(s): DDIMER in the last 72 hours. Hgb A1c No results for input(s): HGBA1C in the last 72 hours. Lipid Profile No results for input(s): CHOL, HDL, LDLCALC, TRIG, CHOLHDL, LDLDIRECT in the last 72 hours. Thyroid function studies No results for input(s): TSH, T4TOTAL, T3FREE, THYROIDAB in the last 72 hours.  Invalid input(s): FREET3 Anemia work up No results for input(s): VITAMINB12, FOLATE, FERRITIN, TIBC, IRON, RETICCTPCT in the last 72 hours. Microbiology Recent Results (from the past 240 hour(s))  Resp Panel by RT-PCR (Flu A&B, Covid) Nasopharyngeal Swab     Status: None   Collection Time: 05/21/21  7:11 PM   Specimen: Nasopharyngeal Swab; Nasopharyngeal(NP) swabs in vial transport medium  Result Value Ref Range Status   SARS Coronavirus 2 by RT PCR NEGATIVE NEGATIVE Final    Comment: (NOTE) SARS-CoV-2 target nucleic acids are NOT DETECTED.  The SARS-CoV-2 RNA is generally detectable in upper respiratory specimens during the acute phase of infection. The lowest concentration of SARS-CoV-2 viral copies this assay can detect is 138 copies/mL. A negative result does not preclude SARS-Cov-2 infection and should not be used as the sole basis for treatment or other patient management decisions. A negative result may occur with  improper specimen collection/handling, submission of specimen other than nasopharyngeal swab, presence of viral mutation(s) within the areas targeted by this assay, and inadequate number of viral copies(<138 copies/mL). A negative result must be combined with clinical observations, patient history, and epidemiological information. The expected result is Negative.  Fact Sheet for Patients:  EntrepreneurPulse.com.au  Fact Sheet for Healthcare Providers:  IncredibleEmployment.be  This test is no t yet approved or  cleared by the [place] FDA and  has been authorized for detection and/or diagnosis of  SARS-CoV-2 by FDA under an Emergency Use Authorization (EUA). This EUA will remain  in effect (meaning this test can be used) for the duration of the COVID-19 declaration under Section 564(b)(1) of the Act, 21 U.S.C.section 360bbb-3(b)(1), unless the authorization is terminated  or revoked sooner.       Influenza A by PCR NEGATIVE NEGATIVE Final   Influenza B by PCR NEGATIVE NEGATIVE Final    Comment: (NOTE) The Xpert Xpress SARS-CoV-2/FLU/RSV plus assay is intended as an aid in the diagnosis of influenza from Nasopharyngeal swab specimens and should not be used as a sole basis for treatment. Nasal washings and aspirates are unacceptable for Xpert Xpress SARS-CoV-2/FLU/RSV testing.  Fact Sheet for Patients: EntrepreneurPulse.com.au  Fact Sheet for Healthcare Providers: IncredibleEmployment.be  This test is not yet approved or cleared by the [place] FDA and has been authorized for detection and/or diagnosis of SARS-CoV-2 by FDA under an Emergency Use Authorization (EUA). This EUA will remain in effect (meaning this test can be used) for the duration of the COVID-19 declaration under Section 564(b)(1) of the Act, 21 U.S.C. section 360bbb-3(b)(1), unless the authorization is terminated or revoked.  Performed at [facility], [address] [place], [place] [address]      Signed: Terrilee Croak  [facility] 05/24/2021, 12:15 PM

## 2021-05-24 NOTE — TOC Progression Note (Signed)
Transition of Care [facility]) - Progression Note    Patient Details  Name: George Bolton MRN: [id number] Date of Birth: Sep 02, 1941  Transition of [facility]) CM/SW [place], LCSW Phone Number: 05/24/2021, 9:53 AM  Clinical Narrative:   Spoke to patient. He is refusing home health at this time. He agreed to let us know if he changes his mind prior to discharge.         Expected Discharge Plan and Services                                                 Social Determinants of Health (SDOH) Interventions    Readmission Risk Interventions No flowsheet data found.

## 2021-05-24 NOTE — Progress Notes (Signed)
Gave discharge instructions to George Bolton, son at bedside with patient present. Removed IV to left AC. Gathered all personal items, awaiting for transportation.

## 2021-06-28 ENCOUNTER — Emergency Department: Payer: Medicare Other

## 2021-06-28 ENCOUNTER — Inpatient Hospital Stay
Admission: EM | Admit: 2021-06-28 | Discharge: 2021-07-01 | DRG: 554 | Disposition: A | Discharge status: Home / Self Care | Payer: Medicare Other | Attending: Internal Medicine | Admitting: Internal Medicine

## 2021-06-28 DIAGNOSIS — N400 Enlarged prostate without lower urinary tract symptoms: Secondary | ICD-10-CM | POA: Diagnosis present

## 2021-06-28 DIAGNOSIS — I2510 Atherosclerotic heart disease of native coronary artery without angina pectoris: Secondary | ICD-10-CM | POA: Diagnosis present

## 2021-06-28 DIAGNOSIS — L0390 Cellulitis, unspecified: Secondary | ICD-10-CM | POA: Diagnosis present

## 2021-06-28 DIAGNOSIS — Z823 Family history of stroke: Secondary | ICD-10-CM

## 2021-06-28 DIAGNOSIS — K5909 Other constipation: Secondary | ICD-10-CM

## 2021-06-28 DIAGNOSIS — E876 Hypokalemia: Secondary | ICD-10-CM | POA: Diagnosis present

## 2021-06-28 DIAGNOSIS — M545 Low back pain, unspecified: Secondary | ICD-10-CM

## 2021-06-28 DIAGNOSIS — E785 Hyperlipidemia, unspecified: Secondary | ICD-10-CM | POA: Diagnosis present

## 2021-06-28 DIAGNOSIS — I4891 Unspecified atrial fibrillation: Secondary | ICD-10-CM | POA: Diagnosis present

## 2021-06-28 DIAGNOSIS — Z888 Allergy status to other drugs, medicaments and biological substances status: Secondary | ICD-10-CM

## 2021-06-28 DIAGNOSIS — K5900 Constipation, unspecified: Secondary | ICD-10-CM | POA: Diagnosis present

## 2021-06-28 DIAGNOSIS — Z20822 Contact with and (suspected) exposure to covid-19: Secondary | ICD-10-CM | POA: Diagnosis present

## 2021-06-28 DIAGNOSIS — A419 Sepsis, unspecified organism: Secondary | ICD-10-CM

## 2021-06-28 DIAGNOSIS — I10 Essential (primary) hypertension: Secondary | ICD-10-CM | POA: Diagnosis not present

## 2021-06-28 DIAGNOSIS — I480 Paroxysmal atrial fibrillation: Secondary | ICD-10-CM | POA: Diagnosis not present

## 2021-06-28 DIAGNOSIS — M109 Gout, unspecified: Secondary | ICD-10-CM | POA: Diagnosis present

## 2021-06-28 DIAGNOSIS — Z9049 Acquired absence of other specified parts of digestive tract: Secondary | ICD-10-CM | POA: Diagnosis not present

## 2021-06-28 DIAGNOSIS — N183 Chronic kidney disease, stage 3 unspecified: Secondary | ICD-10-CM | POA: Diagnosis present

## 2021-06-28 DIAGNOSIS — I130 Hypertensive heart and chronic kidney disease with heart failure and stage 1 through stage 4 chronic kidney disease, or unspecified chronic kidney disease: Secondary | ICD-10-CM | POA: Diagnosis present

## 2021-06-28 DIAGNOSIS — L03113 Cellulitis of right upper limb: Secondary | ICD-10-CM | POA: Diagnosis not present

## 2021-06-28 DIAGNOSIS — I5032 Chronic diastolic (congestive) heart failure: Secondary | ICD-10-CM | POA: Diagnosis present

## 2021-06-28 DIAGNOSIS — M10041 Idiopathic gout, right hand: Secondary | ICD-10-CM

## 2021-06-28 DIAGNOSIS — Z66 Do not resuscitate: Secondary | ICD-10-CM | POA: Diagnosis present

## 2021-06-28 DIAGNOSIS — N1832 Chronic kidney disease, stage 3b: Secondary | ICD-10-CM

## 2021-06-28 DIAGNOSIS — D631 Anemia in chronic kidney disease: Secondary | ICD-10-CM | POA: Diagnosis present

## 2021-06-28 DIAGNOSIS — Z7901 Long term (current) use of anticoagulants: Secondary | ICD-10-CM | POA: Diagnosis not present

## 2021-06-28 DIAGNOSIS — I482 Chronic atrial fibrillation, unspecified: Secondary | ICD-10-CM | POA: Diagnosis present

## 2021-06-28 DIAGNOSIS — I5033 Acute on chronic diastolic (congestive) heart failure: Secondary | ICD-10-CM | POA: Diagnosis present

## 2021-06-28 LAB — CBC WITH DIFFERENTIAL/PLATELET
Abs Immature Granulocytes: 0.04 10*3/uL (ref 0.00–0.07)
Basophils Absolute: 0 10*3/uL (ref 0.0–0.1)
Basophils Relative: 0 %
Eosinophils Absolute: 0 10*3/uL (ref 0.0–0.5)
Eosinophils Relative: 0 %
HCT: 43.2 % (ref 39.0–52.0)
Hemoglobin: 14.3 g/dL (ref 13.0–17.0)
Immature Granulocytes: 0 %
Lymphocytes Relative: 10 %
Lymphs Abs: 1.3 10*3/uL (ref 0.7–4.0)
MCH: 27.3 pg (ref 26.0–34.0)
MCHC: 33.1 g/dL (ref 30.0–36.0)
MCV: 82.4 fL (ref 80.0–100.0)
Monocytes Absolute: 1.4 10*3/uL — ABNORMAL HIGH (ref 0.1–1.0)
Monocytes Relative: 11 %
Neutro Abs: 9.7 10*3/uL — ABNORMAL HIGH (ref 1.7–7.7)
Neutrophils Relative %: 79 %
Platelets: 209 10*3/uL (ref 150–400)
RBC: 5.24 MIL/uL (ref 4.22–5.81)
RDW: 16.8 % — ABNORMAL HIGH (ref 11.5–15.5)
WBC: 12.5 10*3/uL — ABNORMAL HIGH (ref 4.0–10.5)
nRBC: 0 % (ref 0.0–0.2)

## 2021-06-28 LAB — COMPREHENSIVE METABOLIC PANEL
ALT: 9 U/L (ref 0–44)
AST: 17 U/L (ref 15–41)
Albumin: 3.5 g/dL (ref 3.5–5.0)
Alkaline Phosphatase: 75 U/L (ref 38–126)
Anion gap: 10 (ref 5–15)
BUN: 25 mg/dL — ABNORMAL HIGH (ref 8–23)
CO2: 21 mmol/L — ABNORMAL LOW (ref 22–32)
Calcium: 9.3 mg/dL (ref 8.9–10.3)
Chloride: 107 mmol/L (ref 98–111)
Creatinine, Ser: 1.49 mg/dL — ABNORMAL HIGH (ref 0.61–1.24)
GFR, Estimated: 47 mL/min — ABNORMAL LOW (ref >60)
Glucose, Bld: 132 mg/dL — ABNORMAL HIGH (ref 70–99)
Potassium: 3.6 mmol/L (ref 3.5–5.1)
Sodium: 138 mmol/L (ref 135–145)
Total Bilirubin: 1.2 mg/dL (ref 0.3–1.2)
Total Protein: 7.7 g/dL (ref 6.5–8.1)

## 2021-06-28 LAB — PROTIME-INR
INR: 1.3 — ABNORMAL HIGH (ref 0.8–1.2)
Prothrombin Time: 16.1 seconds — ABNORMAL HIGH (ref 11.4–15.2)

## 2021-06-28 LAB — LACTIC ACID, PLASMA: Lactic Acid, Venous: 1.6 mmol/L (ref 0.5–1.9)

## 2021-06-28 LAB — APTT: aPTT: 50 seconds — ABNORMAL HIGH (ref 24–36)

## 2021-06-28 LAB — URIC ACID: Uric Acid, Serum: 8.5 mg/dL (ref 3.7–8.6)

## 2021-06-28 IMAGING — DX DG FOREARM 2V*R*
2 series · 2 of 2 positions shown · non-contrast
Comparison: No dedicated prior forearm imaging. Elbow radiograph
[DATE] reviewed.

CLINICAL DATA: Right hand pain and swelling. Swollen right hand and
wrist. Infection.

EXAM:
RIGHT FOREARM - 2 VIEW

[forearm lat]
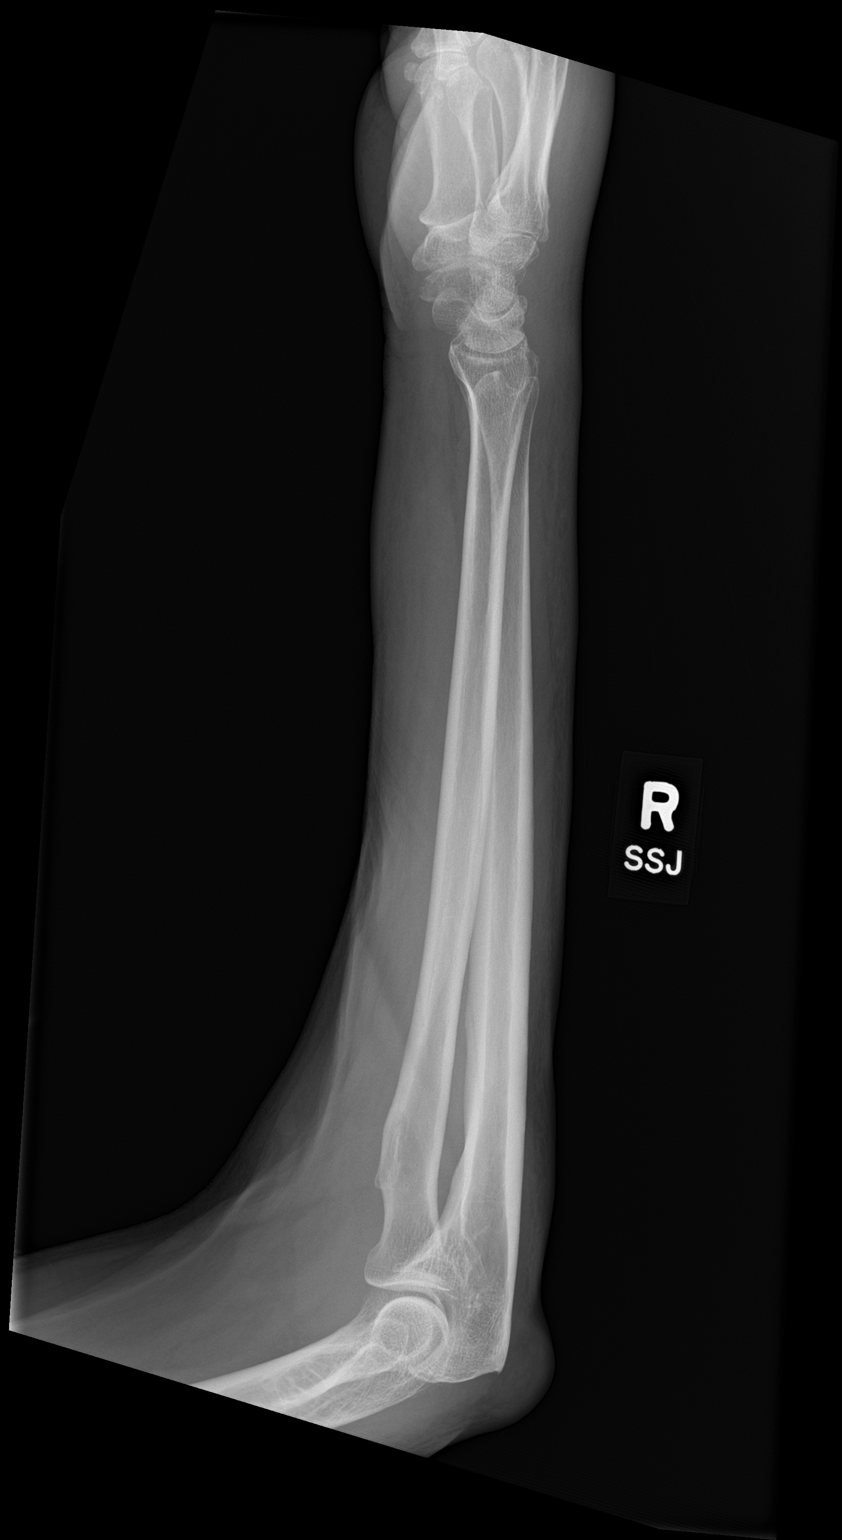

[forearm ap]
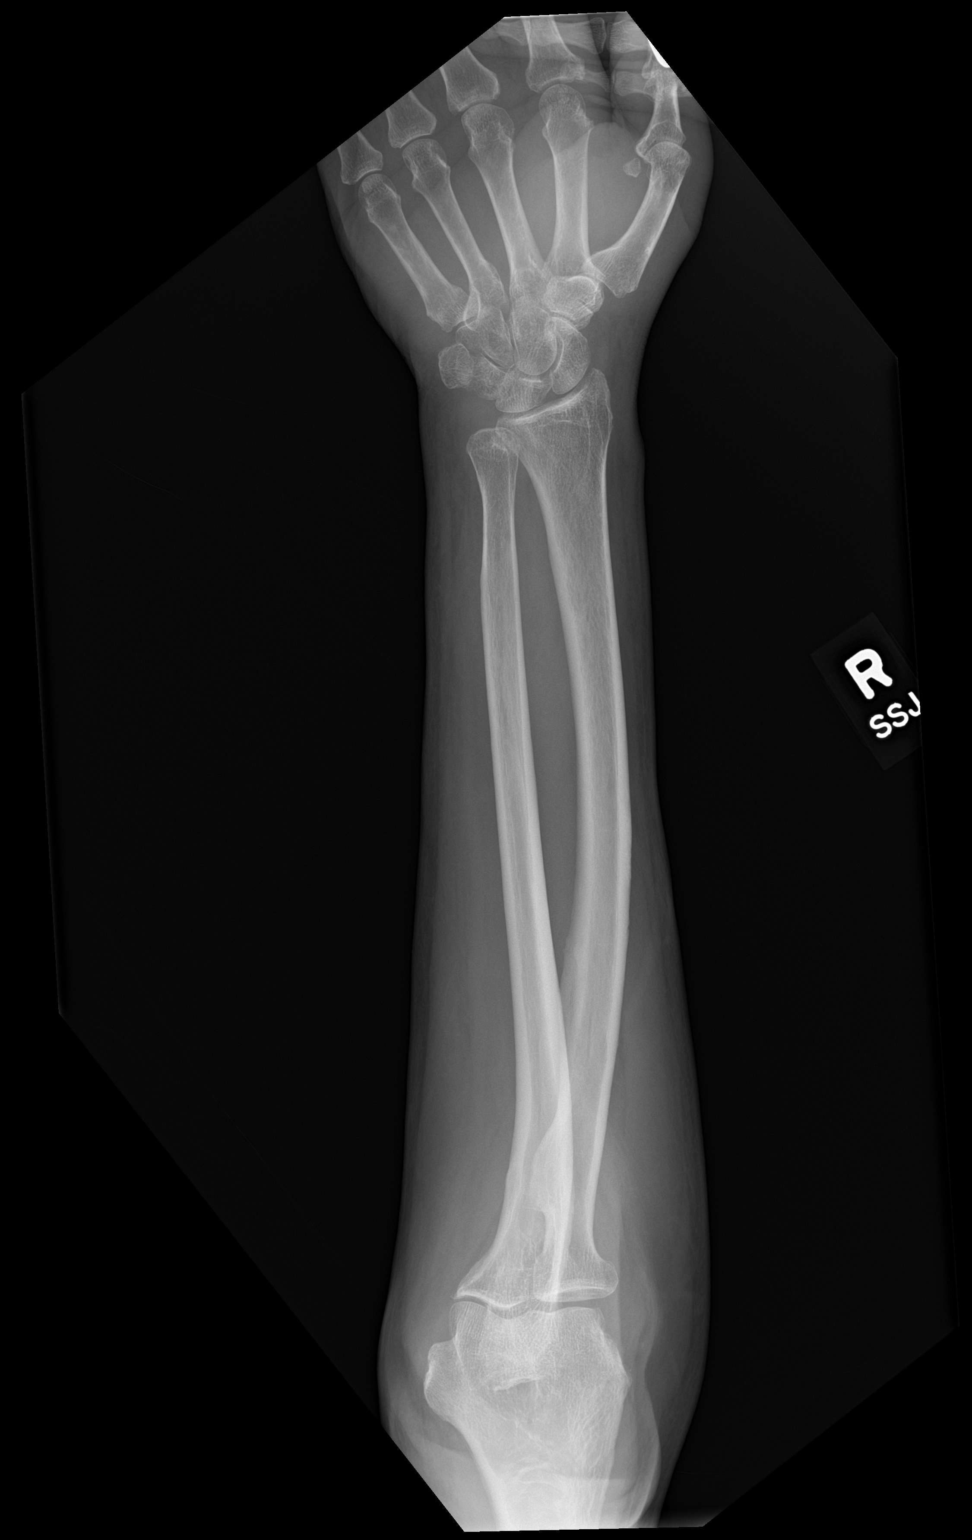

[2 of 2 positions shown; findings below may reference images not displayed]

FINDINGS: No erosion, periosteal reaction or bone destruction. There is no
evidence of fracture or other focal bone lesions. There is
generalized soft tissue edema about the distal forearm and wrist.
Soft tissue edema about the elbow appears improved from prior exam,
with persistent focal soft tissue prominence in the region of the
olecranon bursa. No soft tissue air or radiopaque foreign body.
IMPRESSION: 1. Generalized soft tissue edema about the distal forearm and wrist.
Soft tissue edema about the elbow appears improved from prior exam,
with persistent focal soft tissue prominence in the region of the
olecranon bursa.
2. No soft tissue air or radiopaque foreign body.
3. No radiographic findings of osteomyelitis.

## 2021-06-28 IMAGING — DX DG CHEST 1V PORT
1 series · 1 of 1 positions shown · non-contrast
Comparison: [DATE]

CLINICAL DATA: Questionable sepsis

EXAM:
PORTABLE CHEST 1 VIEW

[chest ap]
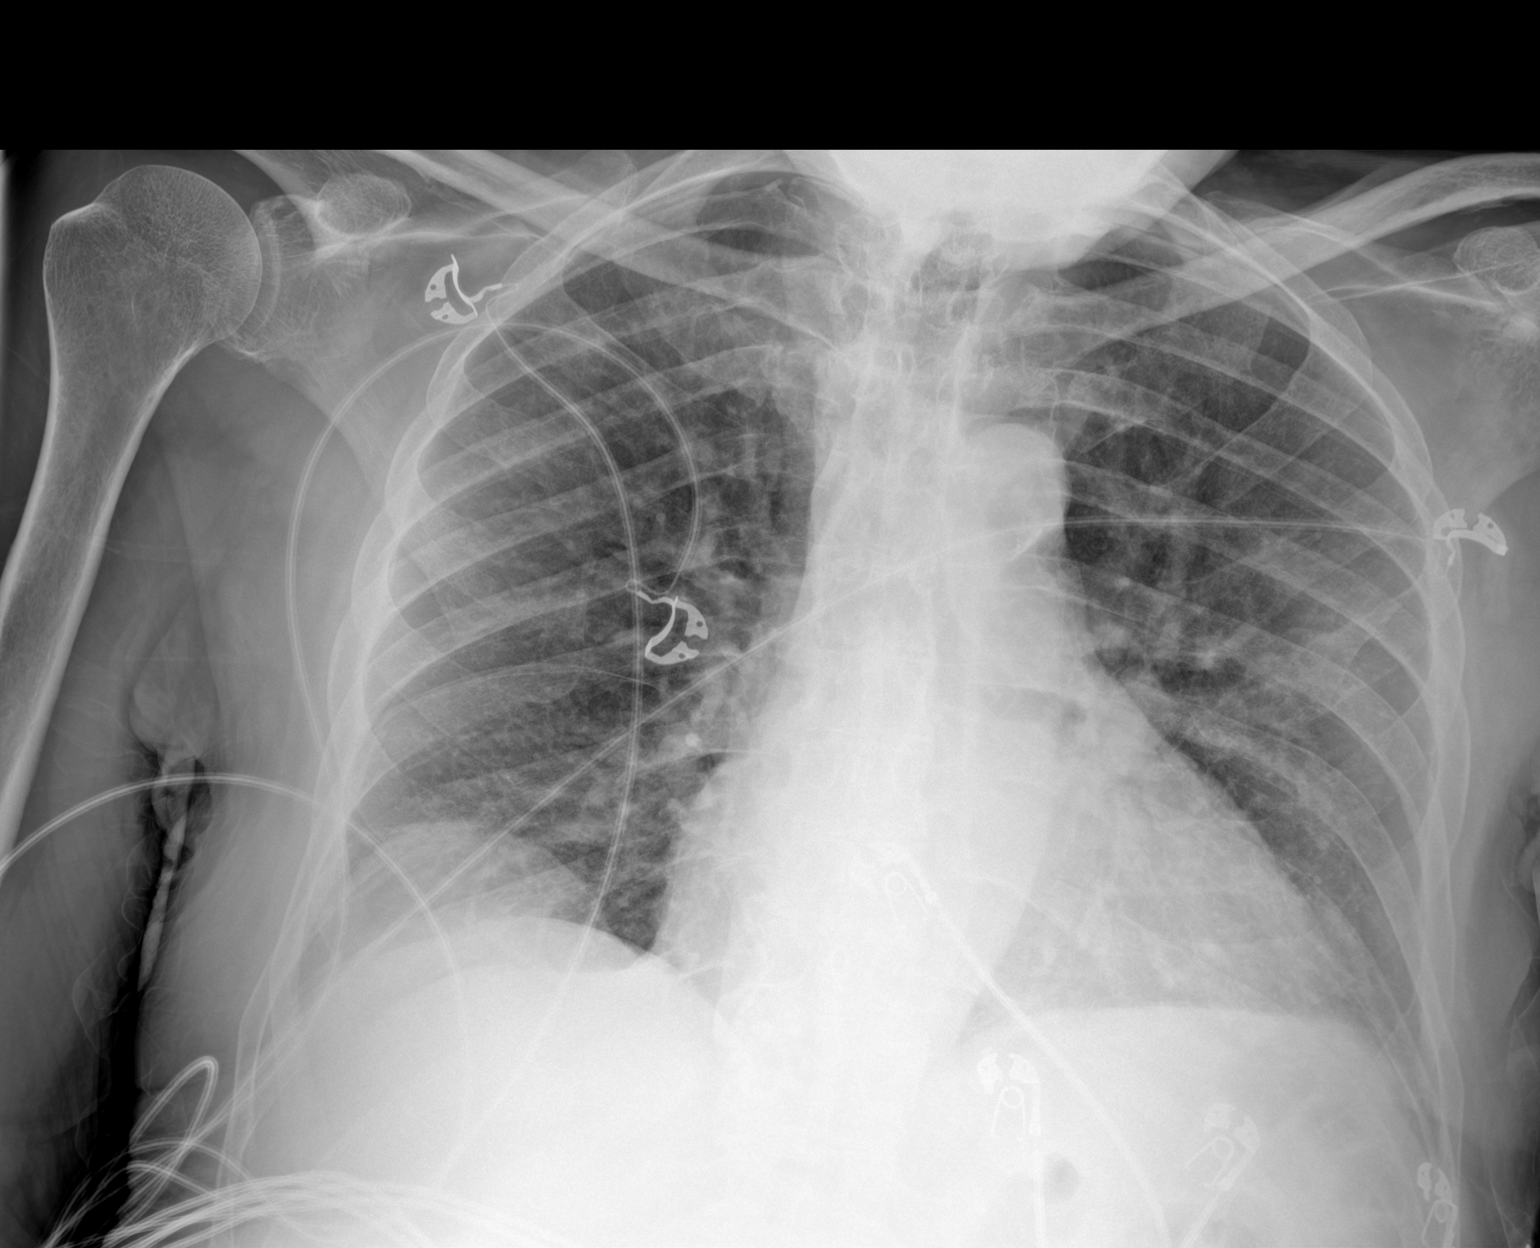

[1 of 1 positions shown; findings below may reference images not displayed]

FINDINGS: Cardiomegaly. No confluent airspace opacities, effusions or edema.
No acute bony abnormality.
IMPRESSION: Cardiomegaly.  No active disease.

## 2021-06-28 IMAGING — DX DG HAND 2V*R*
2 series · 2 of 2 positions shown · non-contrast
Comparison: Hand radiograph [DATE]

CLINICAL DATA: Right hand pain and swelling.  Infection.

EXAM:
RIGHT HAND - 2 VIEW

[hand ap]
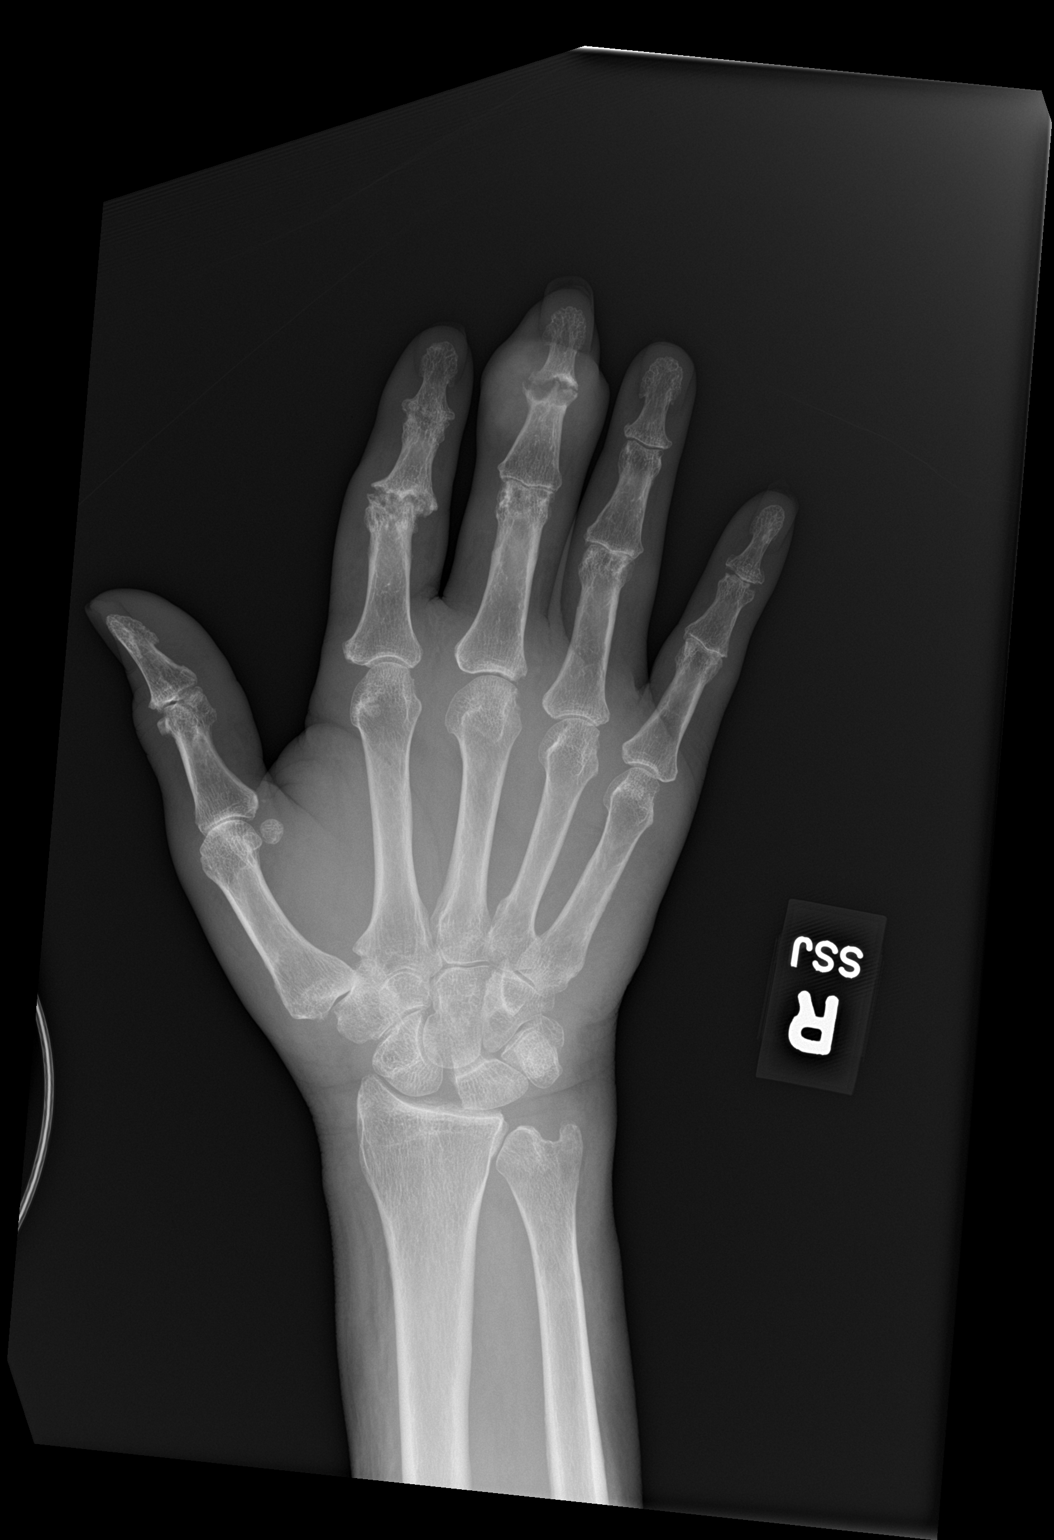

[hand lat]
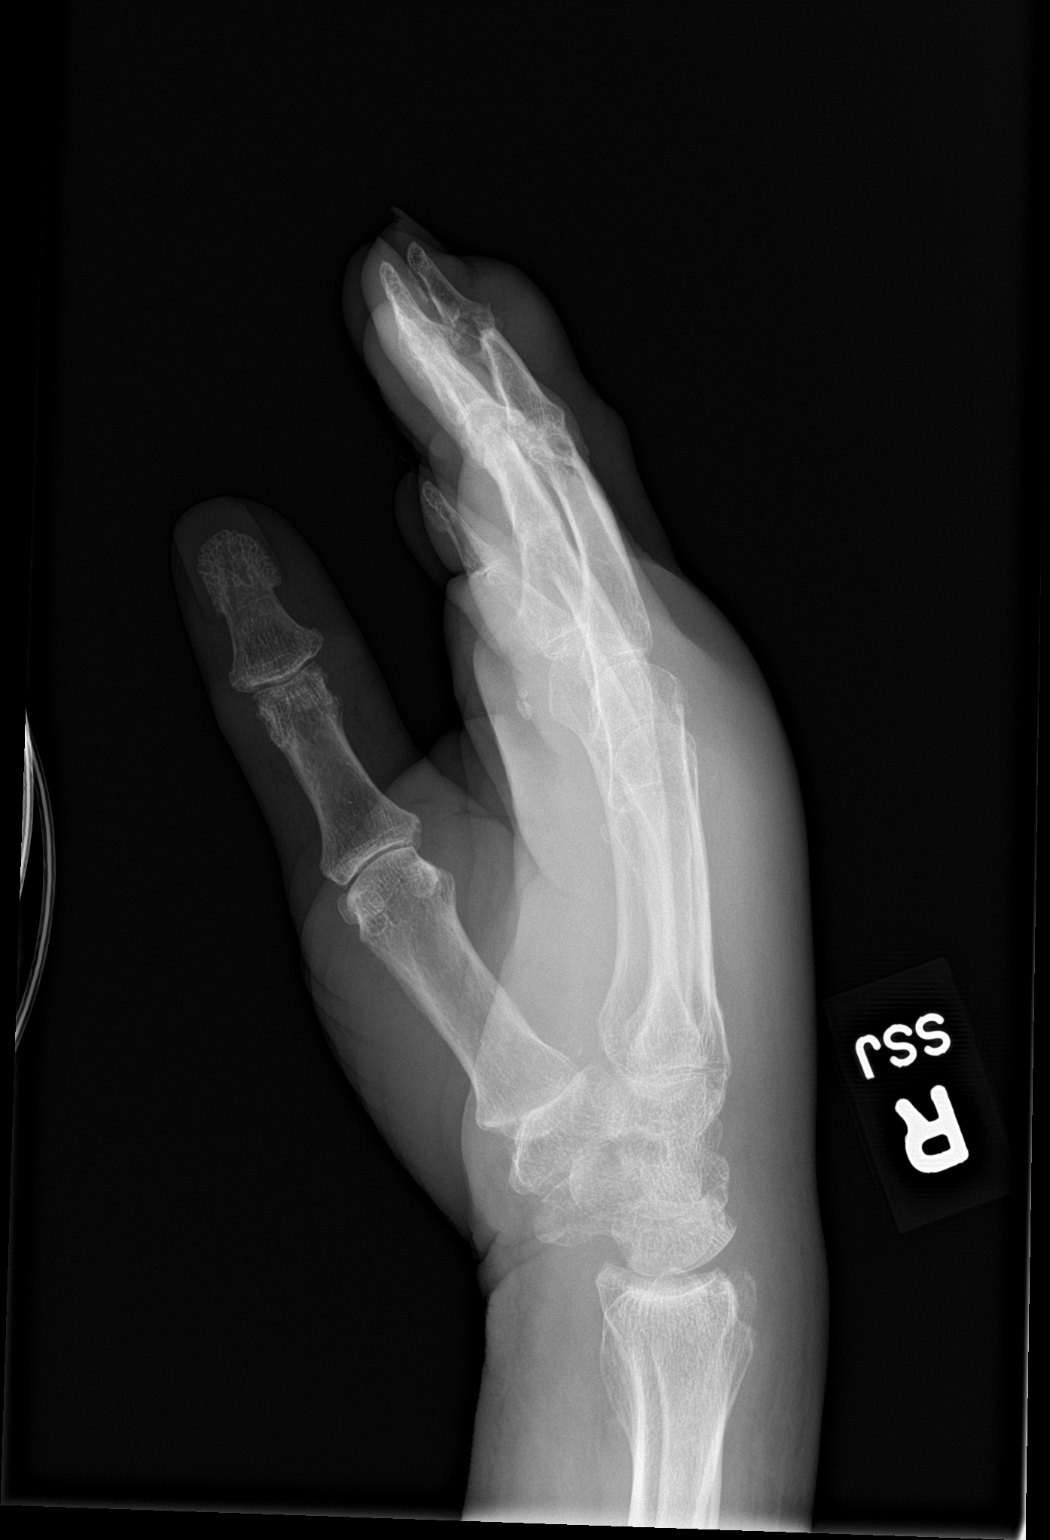

[2 of 2 positions shown; findings below may reference images not displayed]

FINDINGS: Erosions centered at the third digit distal interphalangeal joint
with smoothly marginated borders. SOLECTWO appearance of the index
finger proximal interphalangeal joint. These findings are stable
from prior exam. There is no evidence of bony destruction or
periosteal reaction. Mild multifocal osteoarthritis. Generalized
soft tissue edema throughout the hand. There is more focal soft
tissue prominence about the third digit distal interphalangeal
joint, unchanged. No soft tissue air or radiopaque foreign body.
IMPRESSION: 1. Generalized soft tissue edema throughout the hand. No
radiographic findings of osteomyelitis.
2. Erosive changes centered at the third digit distal
interphalangeal joint and index finger proximal interphalangeal
joint are stable from prior exam. Findings may represent sequela of
gout or erosive arthropathy.

## 2021-06-28 MED ORDER — VANCOMYCIN HCL IN DEXTROSE 1-5 GM/200ML-% IV SOLN: 1000.0000 mg | Freq: Once | INTRAVENOUS | Status: DC

## 2021-06-28 MED ORDER — ACETAMINOPHEN 500 MG PO TABS
1000.0000 mg | ORAL_TABLET | Freq: Once | ORAL | Status: AC
  Administered 2021-06-28: 1000 mg via ORAL
  Filled 2021-06-28: qty 2

## 2021-06-28 MED ORDER — SODIUM CHLORIDE 0.9 % IV SOLN
2.0000 g | Freq: Once | INTRAVENOUS | Status: AC
  Administered 2021-06-28: 2 g via INTRAVENOUS
  Filled 2021-06-28: qty 20

## 2021-06-28 MED ORDER — FENTANYL CITRATE PF 50 MCG/ML IJ SOSY
25.0000 ug | PREFILLED_SYRINGE | Freq: Once | INTRAMUSCULAR | Status: AC
  Administered 2021-06-28: 25 ug via INTRAVENOUS
  Filled 2021-06-28: qty 1

## 2021-06-28 MED ORDER — METRONIDAZOLE 500 MG/100ML IV SOLN
500.0000 mg | Freq: Two times a day (BID) | INTRAVENOUS | Status: DC
Start: 2021-06-28 — End: 2021-06-29
  Administered 2021-06-29: 500 mg via INTRAVENOUS
  Filled 2021-06-28: qty 100

## 2021-06-28 MED ORDER — VANCOMYCIN HCL IN DEXTROSE 1-5 GM/200ML-% IV SOLN
1000.0000 mg | Freq: Once | INTRAVENOUS | Status: AC
  Administered 2021-06-28: 1000 mg via INTRAVENOUS
  Filled 2021-06-28: qty 200

## 2021-06-28 MED ORDER — SODIUM CHLORIDE 0.9 % IV BOLUS (SEPSIS)
1000.0000 mL | Freq: Once | INTRAVENOUS | Status: AC
  Administered 2021-06-28: 1000 mL via INTRAVENOUS

## 2021-06-28 NOTE — ED Triage Notes (Signed)
Pt presents to the ED with complaints of swollen right hand/wrist area with known infection. Pt has been on multiple antibiotics with swelling and infection still present.

## 2021-06-28 NOTE — ED Notes (Signed)
Pt lying in bed trying to sleep after pt placed in gown and brief changed. Pt able to roll to each side with some assistance. Pt stable at this time.

## 2021-06-28 NOTE — ED Provider Notes (Signed)
[facility] Emergency Department Provider Note  ____________________________________________   Event Date/Time   First MD Initiated Contact with Patient 06/28/21 1938     (approximate)  I have reviewed the triage vital signs and the nursing notes.   HISTORY  Chief Complaint Blood Infection    HPI George Bolton is a 80 y.o. male  with PMHx below including afib, chf, htn, gout, here with right hand pain, swelling. Pt reports for the past "few days," he's had progressively worsening aching, throbbing, right hand and now entire R forearm to hand pain. He's had associated swelling, warmth, and exquisite pain with any movement or palpation. States it began along the hand and has now spread. No trauma. He has a h/o gout as well as cellulitis of the arm. States it feels similar to the cellulitis he had in July. Denies numbness, weakness. He's had fever, chills. No pain of his shoulder. Denies recent sick contacts. Denies recent known med changes.         Past Medical History:  Diagnosis Date   Chronic atrial fibrillation ([place])    a. Afib dates back to at least 2002 when reviewing prior EKG with EKGs from 2014 onward showing Afib; b. CHADS2VASc at least 4 (CHF, HTN, age x 2)   Chronic diastolic CHF (congestive heart failure) ([place])    a. TTE 5/19: EF 70-75%, mod LVH, near complete obliteration of the mid and apical LV cavity (can be seen in apical hypertrophic CM), mildly dilated LA, RV cavity size nl, RV wall thickness nl, RVSF mildly reduced, mildly dilated RA; b. 02/2018 Limited echo w/ definity: EF 50-65%, no apical hypertrophic CM.   Gout    Hypertension     Patient Active Problem List   Diagnosis Date Noted   Cellulitis 05/21/2021   Atrial fibrillation with RVR ([place]) 05/21/2021   CKD (chronic kidney disease), stage III ([place]) 05/21/2021   Sepsis ([place]) 05/21/2021   Chronic diastolic congestive heart failure ([place]) 12/01/2018   Atrial fibrillation ([place])  03/15/2018   Lymphedema 03/15/2018   Gout 01/25/2017   CHF (congestive heart failure) ([place]) 04/25/2015   Hypertension 04/25/2015    Past Surgical History:  Procedure Laterality Date   CHOLECYSTECTOMY N/A 01/13/2017   Procedure: LAPAROSCOPIC CHOLECYSTECTOMY;  Surgeon: Jules Husbands, MD;  Location: [facility] ORS;  Service: General;  Laterality: N/A;    Prior to Admission medications   Medication Sig Start Date End Date Taking? Authorizing Provider  allopurinol (ZYLOPRIM) 100 MG tablet Take 50 mg by mouth daily. 06/14/21   [provider]  apixaban (ELIQUIS) 2.5 MG TABS tablet Take 2.5 mg by mouth 2 (two) times daily. 12/27/19   [provider]  aspirin 325 MG tablet Take 325 mg by mouth daily.    [provider]  atorvastatin (LIPITOR) 40 MG tablet Take 1 tablet by mouth daily.    [provider]  cloNIDine (CATAPRES) 0.1 MG tablet Take 1 tablet by mouth 2 (two) times daily. 05/28/20   [provider]  Colchicine 0.6 MG CAPS Take 0.6 mg by mouth daily. 06/14/21   [provider]  diphenhydrAMINE-zinc acetate (BENADRYL) cream Apply 1 application topically 2 (two) times daily as needed for itching. 04/14/21 04/14/22  [provider]  famotidine (PEPCID) 20 MG tablet Take 1 tablet (20 mg total) by mouth 2 (two) times daily. 02/15/21   Carrie Mew, MD  felodipine (PLENDIL) 10 MG 24 hr tablet Take 10 mg by mouth daily. 05/28/21  [provider]  finasteride (PROSCAR) 5 MG tablet Take 1 tablet (5 mg total) by mouth daily. 08/21/20   Zara Council A, PA-C  furosemide (LASIX) 40 MG tablet Take 1 tablet (40 mg total) by mouth daily as needed for fluid. 05/24/21   Terrilee Croak, MD  hydrALAZINE (APRESOLINE) 25 MG tablet Take 25 mg by mouth 3 (three) times daily. 06/24/21   [provider]  HYDROcodone-acetaminophen (NORCO/VICODIN) 5-325 MG tablet Take one tablet at night for pain; may take up to every 6 hours as needed for pain if  not working or driving [id number]   [provider]  isosorbide mononitrate (IMDUR) 30 MG 24 hr tablet Take 30 mg by mouth 2 (two) times daily. 01/31/21   [provider]  metoprolol tartrate (LOPRESSOR) 100 MG tablet Take 1 tablet (100 mg total) by mouth 2 (two) times daily. 05/24/21 08/22/21  Terrilee Croak, MD  NYSTATIN powder APPLY  POWDER TOPICALLY TWICE DAILY 09/26/19   Zara Council A, PA-C    Allergies Cortisone and Triamcinolone  Family History  Problem Relation Age of Onset   Stroke Mother     Social History Social History   Tobacco Use   Smoking status: Never   Smokeless tobacco: Never  Substance Use Topics   Alcohol use: No   Drug use: No    Review of Systems  Review of Systems  Constitutional:  Positive for chills, fatigue and fever.  HENT:  Negative for sore throat.   Respiratory:  Negative for shortness of breath.   Cardiovascular:  Negative for chest pain.  Gastrointestinal:  Negative for abdominal pain.  Genitourinary:  Negative for flank pain.  Musculoskeletal:  Positive for arthralgias. Negative for neck pain.  Skin:  Positive for rash. Negative for wound.  Allergic/Immunologic: Negative for immunocompromised state.  Neurological:  Negative for weakness and numbness.  Hematological:  Does not bruise/bleed easily.  All other systems reviewed and are negative.   ____________________________________________  PHYSICAL EXAM:      VITAL SIGNS: ED Triage Vitals  Enc Vitals Group     BP 06/28/21 1940 128/77     Pulse Rate 06/28/21 1940 (!) 112     Resp 06/28/21 1940 18     Temp 06/28/21 1940 (!) 100.8 F (38.2 C)     Temp Source 06/28/21 1940 Oral     SpO2 06/28/21 1940 92 %     Weight 06/28/21 1941 200 lb (90.7 kg)     Height 06/28/21 1941 6' (1.829 m)     Head Circumference --      Peak Flow --      Pain Score 06/28/21 1941 7     Pain Loc --      Pain Edu? --      Excl. in [place]? --      Physical Exam Vitals and nursing note  reviewed.  Constitutional:      General: He is not in acute distress.    Appearance: He is well-developed.  HENT:     Head: Normocephalic and atraumatic.  Eyes:     Conjunctiva/sclera: Conjunctivae normal.  Cardiovascular:     Rate and Rhythm: Tachycardia present. Rhythm irregular.     Heart sounds: Normal heart sounds.  Pulmonary:     Effort: Pulmonary effort is normal. No respiratory distress.     Breath sounds: No wheezing.  Abdominal:     General: There is no distension.  Musculoskeletal:     Cervical back: Neck supple.  Skin:  General: Skin is warm.     Capillary Refill: Capillary refill takes less than 2 seconds.     Findings: No rash.  Neurological:     Mental Status: He is alert and oriented to person, place, and time.     Motor: No abnormal muscle tone.     UPPER EXTREMITY EXAM: right  INSPECTION & PALPATION: Marked erythema, warmth along right distal UE, from elbow to dorsum of hand. Tophus noted on DIPs but no overt fluctuance. No open wounds. No drainage. Diffuse pain btu no overt increased pain w/ pROM of elbow, wrist, fingers.  SENSORY: Sensation is intact to light touch in:  Superficial radial nerve distribution (dorsal first web space) Median nerve distribution (tip of index finger)   Ulnar nerve distribution (tip of small finger)     MOTOR:  + Motor posterior interosseous nerve (thumb IP extension) + Anterior interosseous nerve (thumb IP flexion, index finger DIP flexion) + Radial nerve (wrist extension) + Median nerve (palpable firing thenar mass) + Ulnar nerve (palpable firing of first dorsal interosseous muscle)  VASCULAR: 2+ radial pulse Brisk capillary refill < 2 sec, fingers warm and well-perfused  COMPARTMENTS: Soft, warm, well-perfused No pain with passive extension No paresthesias   ____________________________________________   LABS (all labs ordered are listed, but only abnormal results are displayed)  Labs Reviewed   COMPREHENSIVE METABOLIC PANEL - Abnormal; Notable for the following components:      Result Value   CO2 21 (*)    Glucose, Bld 132 (*)    BUN 25 (*)    Creatinine, Ser 1.49 (*)    GFR, Estimated 47 (*)    All other components within normal limits  CBC WITH DIFFERENTIAL/PLATELET - Abnormal; Notable for the following components:   WBC 12.5 (*)    RDW 16.8 (*)    Neutro Abs 9.7 (*)    Monocytes Absolute 1.4 (*)    All other components within normal limits  PROTIME-INR - Abnormal; Notable for the following components:   Prothrombin Time 16.1 (*)    INR 1.3 (*)    All other components within normal limits  APTT - Abnormal; Notable for the following components:   aPTT 50 (*)    All other components within normal limits  TROPONIN I (HIGH SENSITIVITY) - Abnormal; Notable for the following components:   Troponin I (High Sensitivity) 25 (*)    All other components within normal limits  RESP PANEL BY RT-PCR (FLU A&B, COVID) ARPGX2  CULTURE, BLOOD (ROUTINE X 2)  CULTURE, BLOOD (ROUTINE X 2)  URINE CULTURE  MRSA NEXT GEN BY PCR, NASAL  LACTIC ACID, PLASMA  URIC ACID  PROCALCITONIN  MAGNESIUM  LACTIC ACID, PLASMA  URINALYSIS, COMPLETE (UACMP) WITH MICROSCOPIC  MAGNESIUM  PHOSPHORUS  CBC WITH DIFFERENTIAL/PLATELET  TSH  COMPREHENSIVE METABOLIC PANEL    ____________________________________________  EKG: Atrial fibrillation, ventricular rate 134.  QRS 85, QTc 448.  Nonspecific T wave changes likely rate related.  PVCs noted.  No acute ST elevations. ________________________________________  RADIOLOGY All imaging, including plain films, CT scans, and ultrasounds, independently reviewed by me, and interpretations confirmed via formal radiology reads.  ED MD interpretation:   DG Forearm Right: Soft tissue edema, no bony abnormality Dg Hand: Soft tissue swelling no bony abnormality CXR: Cardiomegaly, no active disease  Official radiology report(s): DG Forearm Right  Result  Date: 06/28/2021 CLINICAL DATA:  Right hand pain and swelling. Swollen right hand and wrist. Infection. EXAM: RIGHT FOREARM - 2 VIEW COMPARISON:  No dedicated prior forearm imaging. Elbow radiograph 05/21/2021 reviewed. FINDINGS: No erosion, periosteal reaction or bone destruction. There is no evidence of fracture or other focal bone lesions. There is generalized soft tissue edema about the distal forearm and wrist. Soft tissue edema about the elbow appears improved from prior exam, with persistent focal soft tissue prominence in the region of the olecranon bursa. No soft tissue air or radiopaque foreign body. IMPRESSION: 1. Generalized soft tissue edema about the distal forearm and wrist. Soft tissue edema about the elbow appears improved from prior exam, with persistent focal soft tissue prominence in the region of the olecranon bursa. 2. No soft tissue air or radiopaque foreign body. 3. No radiographic findings of osteomyelitis. Electronically Signed   By: Keith Rake M.D.   On: 06/28/2021 20:13   DG Hand 2 View Right  Result Date: 06/28/2021 CLINICAL DATA:  Right hand pain and swelling.  Infection. EXAM: RIGHT HAND - 2 VIEW COMPARISON:  Hand radiograph 05/21/2021 FINDINGS: Erosions centered at the third digit distal interphalangeal joint with smoothly marginated borders. Gull wing appearance of the index finger proximal interphalangeal joint. These findings are stable from prior exam. There is no evidence of bony destruction or periosteal reaction. Mild multifocal osteoarthritis. Generalized soft tissue edema throughout the hand. There is more focal soft tissue prominence about the third digit distal interphalangeal joint, unchanged. No soft tissue air or radiopaque foreign body. IMPRESSION: 1. Generalized soft tissue edema throughout the hand. No radiographic findings of osteomyelitis. 2. Erosive changes centered at the third digit distal interphalangeal joint and index finger proximal interphalangeal  joint are stable from prior exam. Findings may represent sequela of gout or erosive arthropathy. Electronically Signed   By: Keith Rake M.D.   On: 06/28/2021 20:16   DG Chest Port 1 View  Result Date: 06/28/2021 CLINICAL DATA:  Questionable sepsis EXAM: PORTABLE CHEST 1 VIEW COMPARISON:  05/21/2021 FINDINGS: Cardiomegaly. No confluent airspace opacities, effusions or edema. No acute bony abnormality. IMPRESSION: Cardiomegaly.  No active disease. Electronically Signed   By: Rolm Baptise M.D.   On: 06/28/2021 20:12    ____________________________________________  PROCEDURES   Procedure(s) performed (including Critical Care):  .Critical Care  Date/Time: 06/29/2021 1:16 AM Performed by: Duffy Bruce, MD Authorized by: Duffy Bruce, MD   Critical care provider statement:    Critical care time (minutes):  35   Critical care time was exclusive of:  Separately billable procedures and treating other patients and teaching time   Critical care was necessary to treat or prevent imminent or life-threatening deterioration of the following conditions:  Cardiac failure, circulatory failure and sepsis   Critical care was time spent personally by me on the following activities:  Development of treatment plan with patient or surrogate, discussions with consultants, evaluation of patient's response to treatment, examination of patient, obtaining history from patient or surrogate, ordering and performing treatments and interventions, ordering and review of laboratory studies, ordering and review of radiographic studies, pulse oximetry, re-evaluation of patient's condition and review of old charts   I assumed direction of critical care for this patient from another provider in my specialty: no  8  ____________________________________________  INITIAL IMPRESSION / MDM / [place] / ED COURSE  As part of my medical decision making, I reviewed the following data within the electronic medical  record:  Nursing notes reviewed and incorporated, Old chart reviewed, Notes from prior ED visits, and [place]  Vanhaitsma was evaluated in Emergency Department on 06/29/2021 for the symptoms described in the history of present illness. He was evaluated in the context of the global COVID-19 pandemic, which necessitated consideration that the patient might be at risk for infection with the SARS-CoV-2 virus that causes COVID-19. Institutional protocols and algorithms that pertain to the evaluation of patients at risk for COVID-19 are in a state of rapid change based on information released by regulatory bodies including the CDC and federal and state organizations. These policies and algorithms were followed during the patient's care in the ED.  Some ED evaluations and interventions may be delayed as a result of limited staffing during the pandemic.*     Medical Decision Making:  80 yo M here with pain, swelling of R hand and forearm. H/o recurrent cellulitis and exam is c/w this again. Pt arrives febrile, tachycardic, with leukocytosis c/w sepsis from cellulitis. Will start empiric fluids, abx. LA normal. Pt also has a ho gout but fever, leukocytosis is more c/f infection. No appreciable single joint that is more swollen or involved, doubt septic arthritis. Otherwise, pt is HDS. He is in AFib RVR but suspect this is related to his fever, infection so will trial tylenol, fluids before starting rate control. Plain films obtained, reviewed, and show no underlying bony abnormality or free air. Pt nontoxic, doubt nec fasc/deeper infection. No focal abscess. Will admit to medicine.  ____________________________________________  FINAL CLINICAL IMPRESSION(S) / ED DIAGNOSES  Final diagnoses:  Sepsis due to cellulitis ([place])  Paroxysmal atrial fibrillation ([place])     MEDICATIONS GIVEN DURING THIS VISIT:  Medications  metroNIDAZOLE (FLAGYL) IVPB 500 mg (500 mg Intravenous  New Bag/Given 06/29/21 0109)  allopurinol (ZYLOPRIM) tablet 50 mg (has no administration in time range)  apixaban (ELIQUIS) tablet 2.5 mg (0 mg Oral Hold 06/29/21 0112)  aspirin EC tablet 325 mg (has no administration in time range)  atorvastatin (LIPITOR) tablet 40 mg (has no administration in time range)  famotidine (PEPCID) tablet 20 mg (20 mg Oral Given 06/29/21 0101)  finasteride (PROSCAR) tablet 5 mg (has no administration in time range)  metoprolol tartrate (LOPRESSOR) tablet 50 mg (50 mg Oral Given 06/29/21 0101)  0.9 %  sodium chloride infusion (75 mL/hr Intravenous New Bag/Given 06/29/21 0101)  acetaminophen (TYLENOL) tablet 650 mg (has no administration in time range)    Or  acetaminophen (TYLENOL) suppository 650 mg (has no administration in time range)  HYDROcodone-acetaminophen (NORCO/VICODIN) 5-325 MG per tablet 1-2 tablet (has no administration in time range)  fentaNYL (SUBLIMAZE) injection 12.5-50 mcg (has no administration in time range)  ceFEPIme (MAXIPIME) 2 g in sodium chloride 0.9 % 100 mL IVPB (0 g Intravenous Stopped 06/29/21 0105)  colchicine tablet 0.6 mg (has no administration in time range)  polyethylene glycol (MIRALAX / GLYCOLAX) packet 17 g (has no administration in time range)  bisacodyl (DULCOLAX) suppository 10 mg (has no administration in time range)  vancomycin (VANCOREADY) IVPB 1250 mg/250 mL (has no administration in time range)  cefTRIAXone (ROCEPHIN) 2 g in sodium chloride 0.9 % 100 mL IVPB (0 g Intravenous Stopped 06/28/21 2052)  sodium chloride 0.9 % bolus 1,000 mL (0 mLs Intravenous Stopped 06/28/21 2207)  acetaminophen (TYLENOL) tablet 1,000 mg (1,000 mg Oral Given 06/28/21 2006)  vancomycin (VANCOCIN) IVPB 1000 mg/200 mL premix (0 mg Intravenous Stopped 06/28/21 2207)    Followed by  vancomycin (VANCOCIN) IVPB 1000 mg/200 mL premix (0 mg Intravenous Stopped 06/29/21 0021)  fentaNYL (SUBLIMAZE) injection 25 mcg (25 mcg Intravenous  Given 06/28/21 2207)     ED Discharge  Orders     None        Note:  This document was prepared using Dragon voice recognition software and may include unintentional dictation errors.   Duffy Bruce, MD 06/29/21 [phone]

## 2021-06-28 NOTE — H&P (Signed)
George Bolton FYT:[id number] DOB: August 01, 1941 DOA: 06/28/2021     PCP: Denton Lank, MD   Outpatient Specialists:   CARDS:  Dr. Edd Arbour    Patient arrived to ER on 06/28/21 at 1933 Referred by Attending Duffy Bruce, MD   Patient coming from: home Lives  With family    Chief Complaint:   Chief Complaint  Patient presents with   Blood Infection    HPI: George Bolton is a 80 y.o. male with medical history significant of HTN, HLD, A. fib on Eliquis, gout.  CKD stage IIIb diastolic CHF    Presented with   swollen Right hand/wrist Has been on ABX but does not seem to help Reports increased pain for the past few days over right forearm and hand.  With associated swelling and warmth no trauma Reports fevers and chills at home  Last admission for the same in July was at the time diagnosed with cellulitis of right forearm treated with IV antibiotics Concomitantly during therapy of colchicine for his gout symptoms improved Denies chest pain but for 3 weeks been feeling like his chest is stuffed up  Reports constipation   Lower back pain denies incontinence of numbness obtain plain films to start with  Has   been vaccinated against COVID     Initial COVID TEST    in house  PCR testing  Pending  Lab Results  Component Value Date   [place] NEGATIVE 05/21/2021     Regarding pertinent Chronic problems:     Hyperlipidemia -  on statins lipitor not taking regularly    HTN on  Lopressor 50 mg twice daily, clonidine 0.1 mg twice daily,   Imdur 30 mg twice daily,     chronic CHF diastolic  - last echo 1771 with EF 70 to 75%, moderate LVH , Lasix 40 mg daily as needed    CAD  - On Aspirin, statin, betablocker,                  -  followed by cardiology        A. Fib -   CHA2DS2 vas score   5       current  on anticoagulation with  Eliquis,           -  Rate control:  Currently controlled with  Toprolol,         CKD stage IIIb- baseline Cr   1.6 Estimated Creatinine Clearance: 42 mL/min (A) (by C-G formula based on SCr of 1.49 mg/dL (H)).  Lab Results  Component Value Date   CREATININE 1.49 (H) 06/28/2021   CREATININE 1.57 (H) 05/23/2021   CREATININE 1.60 (H) 05/22/2021    BPH - on    Proscar        Chronic anemia - baseline hg Hemoglobin & Hematocrit  Recent Labs    05/22/21 0621 05/23/21 0513 06/28/21 1951  HGB 12.6* 11.7* 14.3    While in ER: Noted febrile up to 100.8 tachycardic elevated white blood cell count meeting sepsis criteria source being cellulitis EKG showing atrial fibrillation with RVR heart rate up to 134 Started IV antibiotics ceftriaxone vancomycin given IV fluids 1 bolus and given Tylenol for fever Plain images showed no evidence of soft tissue air or radiopaque foreign bodies no evidence of osteomyelitis ED Triage Vitals  Enc Vitals Group     BP 06/28/21 1940 128/77     Pulse Rate 06/28/21 1940 (!) 112     Resp  06/28/21 1940 18     Temp 06/28/21 1940 (!) 100.8 F (38.2 C)     Temp Source 06/28/21 1940 Oral     SpO2 06/28/21 1940 92 %     Weight 06/28/21 1941 200 lb (90.7 kg)     Height 06/28/21 1941 6' (1.829 m)     Head Circumference --      Peak Flow --      Pain Score 06/28/21 1941 7     Pain Loc --      Pain Edu? --      Excl. in [place]? --   TMAX(24)@     _________________________________________ Significant initial  Findings: Abnormal Labs Reviewed  COMPREHENSIVE METABOLIC PANEL - Abnormal; Notable for the following components:      Result Value   CO2 21 (*)    Glucose, Bld 132 (*)    BUN 25 (*)    Creatinine, Ser 1.49 (*)    GFR, Estimated 47 (*)    All other components within normal limits  CBC WITH DIFFERENTIAL/PLATELET - Abnormal; Notable for the following components:   WBC 12.5 (*)    RDW 16.8 (*)    Neutro Abs 9.7 (*)    Monocytes Absolute 1.4 (*)    All other components within normal limits  PROTIME-INR - Abnormal; Notable for the following components:    Prothrombin Time 16.1 (*)    INR 1.3 (*)    All other components within normal limits  APTT - Abnormal; Notable for the following components:   aPTT 50 (*)    All other components within normal limits   ____________________________________________ Ordered     CXR -  NON acute cardiomegaly  RIGHT FOREARM No soft tissue air or radiopaque foreign body. No radiographic findings of osteomyelitis  RIGHT HAND soft tissue edema erosive arthropathy.   ECG: Ordered Personally reviewed by me showing: HR : 134 Rhythm:   A.fib. W RVR,    no evidence of ischemic changes QTC* ____________________ This patient meets SIRS Criteria and may be septic.    The recent clinical data is shown below. Vitals:   06/28/21 2130 06/28/21 2206 06/28/21 2230 06/28/21 2300  BP: 124/85 124/85 123/84 121/83  Pulse:  (!) 106    Resp: 14 18 (!) 24 (!) 23  Temp:  98.9 F (37.2 C)    TempSrc:  Oral    SpO2:  97%    Weight:      Height:         WBC     Component Value Date/Time   WBC 12.5 (H) 06/28/2021 1951   LYMPHSABS 1.3 06/28/2021 1951   LYMPHSABS 0.8 (L) 10/22/2013 0517   MONOABS 1.4 (H) 06/28/2021 1951   MONOABS 0.6 10/22/2013 0517   EOSABS 0.0 06/28/2021 1951   EOSABS 0.0 10/22/2013 0517   BASOSABS 0.0 06/28/2021 1951   BASOSABS 0.0 10/22/2013 0517    Lactic Acid, Venous    Component Value Date/Time   LATICACIDVEN 1.6 06/28/2021 1951     Procalcitonin  Ordered      UA  not ordered    Results for orders placed or performed during the hospital encounter of 05/21/21  Resp Panel by RT-PCR (Flu A&B, Covid) Nasopharyngeal Swab     Status: None   Collection Time: 05/21/21  7:11 PM   Specimen: Nasopharyngeal Swab; Nasopharyngeal(NP) swabs in vial transport medium  Result Value Ref Range Status   SARS Coronavirus 2 by RT PCR NEGATIVE NEGATIVE Final    Comment: (  NOTE) SARS-CoV-2 target nucleic acids are NOT DETECTED.  The SARS-CoV-2 RNA is generally detectable in upper  respiratory specimens during the acute phase of infection. The lowest concentration of SARS-CoV-2 viral copies this assay can detect is 138 copies/mL. A negative result does not preclude SARS-Cov-2 infection and should not be used as the sole basis for treatment or other patient management decisions. A negative result may occur with  improper specimen collection/handling, submission of specimen other than nasopharyngeal swab, presence of viral mutation(s) within the areas targeted by this assay, and inadequate number of viral copies(<138 copies/mL). A negative result must be combined with clinical observations, patient history, and epidemiological information. The expected result is Negative.  Fact Sheet for Patients:  EntrepreneurPulse.com.au  Fact Sheet for Healthcare Providers:  IncredibleEmployment.be  This test is no t yet approved or cleared by the [place] FDA and  has been authorized for detection and/or diagnosis of SARS-CoV-2 by FDA under an Emergency Use Authorization (EUA). This EUA will remain  in effect (meaning this test can be used) for the duration of the COVID-19 declaration under Section 564(b)(1) of the Act, 21 U.S.C.section 360bbb-3(b)(1), unless the authorization is terminated  or revoked sooner.       Influenza A by PCR NEGATIVE NEGATIVE Final   Influenza B by PCR NEGATIVE NEGATIVE Final    Comment: (NOTE) The Xpert Xpress SARS-CoV-2/FLU/RSV plus assay is intended as an aid in the diagnosis of influenza from Nasopharyngeal swab specimens and should not be used as a sole basis for treatment. Nasal washings and aspirates are unacceptable for Xpert Xpress SARS-CoV-2/FLU/RSV testing.  Fact Sheet for Patients: EntrepreneurPulse.com.au  Fact Sheet for Healthcare Providers: IncredibleEmployment.be  This test is not yet approved or cleared by the [place] FDA and has been  authorized for detection and/or diagnosis of SARS-CoV-2 by FDA under an Emergency Use Authorization (EUA). This EUA will remain in effect (meaning this test can be used) for the duration of the COVID-19 declaration under Section 564(b)(1) of the Act, 21 U.S.C. section 360bbb-3(b)(1), unless the authorization is terminated or revoked.  Performed at [facility], [address], [place], [place] [address]      ____________ Hospitalist was called for admission for sepsis due to cellulitis  The following Work up has been ordered so far:  Orders Placed This Encounter  Procedures   Resp Panel by RT-PCR (Flu A&B, Covid) Nasopharyngeal Swab   Blood Culture (routine x 2)   Urine Culture   DG Chest Port 1 View   DG Hand 2 View Right   DG Forearm Right   Lactic acid, plasma   Comprehensive metabolic panel   CBC WITH DIFFERENTIAL   Protime-INR   APTT   Urinalysis, Complete w Microscopic   Uric acid   Diet NPO time specified   Cardiac monitoring   Document height and weight   Assess and Document Glasgow Coma Scale   Document vital signs within 1-hour of fluid bolus completion. Notify provider of abnormal vital signs despite fluid resuscitation.   DO NOT delay antibiotics if unable to obtain blood culture.   Refer to Sidebar Report: Sepsis Sidebar ED/IP   Notify provider for difficulties obtaining IV access.   Insert peripheral IV x 2   Initiate Carrier Fluid Protocol   Code Sepsis activation.  This occurs automatically when order is signed and prioritizes pharmacy, lab, and radiology services for STAT collections and interventions.  If [facility] downtime, call [facility] [phone]) to activate Code Sepsis.   Consult  to hospitalist   Pulse oximetry, continuous   ED EKG 12-Lead    Following Medications were ordered in ER: Medications  vancomycin (VANCOCIN) IVPB 1000 mg/200 mL premix (0 mg Intravenous Stopped 06/28/21 2207)    Followed by  vancomycin (VANCOCIN) IVPB 1000  mg/200 mL premix (1,000 mg Intravenous New Bag/Given 06/28/21 2207)  cefTRIAXone (ROCEPHIN) 2 g in sodium chloride 0.9 % 100 mL IVPB (0 g Intravenous Stopped 06/28/21 2052)  sodium chloride 0.9 % bolus 1,000 mL (0 mLs Intravenous Stopped 06/28/21 2207)  acetaminophen (TYLENOL) tablet 1,000 mg (1,000 mg Oral Given 06/28/21 2006)  fentaNYL (SUBLIMAZE) injection 25 mcg (25 mcg Intravenous Given 06/28/21 2207)        Consult Orders  (From admission, onward)           Start     Ordered   06/28/21 2245  Consult to hospitalist  Once       Provider:  (Not yet assigned)  Question Answer Comment  Place call to: Hospitalist   Reason for Consult Admit      06/28/21 2244              OTHER Significant initial  Findings:  labs showing:    Recent Labs  Lab 06/28/21 1951  NA 138  K 3.6  CO2 21*  GLUCOSE 132*  BUN 25*  CREATININE 1.49*  CALCIUM 9.3    Cr  stable Lab Results  Component Value Date   CREATININE 1.49 (H) 06/28/2021   CREATININE 1.57 (H) 05/23/2021   CREATININE 1.60 (H) 05/22/2021    Recent Labs  Lab 06/28/21 1951  AST 17  ALT 9  ALKPHOS 75  BILITOT 1.2  PROT 7.7  ALBUMIN 3.5   Lab Results  Component Value Date   CALCIUM 9.3 06/28/2021       Plt: Lab Results  Component Value Date   PLT 209 06/28/2021     COVID-19 Labs  No results for input(s): DDIMER, FERRITIN, LDH, CRP in the last 72 hours.  Lab Results  Component Value Date   SARSCOV2NAA NEGATIVE 05/21/2021      Recent Labs  Lab 06/28/21 1951  WBC 12.5*  NEUTROABS 9.7*  HGB 14.3  HCT 43.2  MCV 82.4  PLT 209    HG/HCT  stable,      Component Value Date/Time   HGB 14.3 06/28/2021 1951   HGB 14.8 10/22/2013 0517   HCT 43.2 06/28/2021 1951   HCT 43.2 10/22/2013 0517   MCV 82.4 06/28/2021 1951   MCV 83 10/22/2013 0517     Cardiac Panel (last 3 results) No results for input(s): CKTOTAL, CKMB, TROPONINI, RELINDX in the last 72 hours.       Cultures:    Component Value  Date/Time   SDES BLOOD RIGHT ANTECUBITAL 04/21/2018 1915   SPECREQUEST  04/21/2018 1915    BOTTLES DRAWN AEROBIC AND ANAEROBIC Blood Culture adequate volume   CULT  04/21/2018 1915    NO GROWTH 5 DAYS Performed at [facility], [address] [place], [place] [address]    REPTSTATUS 04/26/2018 FINAL 04/21/2018 1915     Radiological Exams on Admission: DG Forearm Right  Result Date: 06/28/2021 CLINICAL DATA:  Right hand pain and swelling. Swollen right hand and wrist. Infection. EXAM: RIGHT FOREARM - 2 VIEW COMPARISON:  No dedicated prior forearm imaging. Elbow radiograph 05/21/2021 reviewed. FINDINGS: No erosion, periosteal reaction or bone destruction. There is no evidence of fracture or other focal bone lesions. There is generalized soft  tissue edema about the distal forearm and wrist. Soft tissue edema about the elbow appears improved from prior exam, with persistent focal soft tissue prominence in the region of the olecranon bursa. No soft tissue air or radiopaque foreign body. IMPRESSION: 1. Generalized soft tissue edema about the distal forearm and wrist. Soft tissue edema about the elbow appears improved from prior exam, with persistent focal soft tissue prominence in the region of the olecranon bursa. 2. No soft tissue air or radiopaque foreign body. 3. No radiographic findings of osteomyelitis. Electronically Signed   By: Keith Rake M.D.   On: 06/28/2021 20:13   DG Hand 2 View Right  Result Date: 06/28/2021 CLINICAL DATA:  Right hand pain and swelling.  Infection. EXAM: RIGHT HAND - 2 VIEW COMPARISON:  Hand radiograph 05/21/2021 FINDINGS: Erosions centered at the third digit distal interphalangeal joint with smoothly marginated borders. Gull wing appearance of the index finger proximal interphalangeal joint. These findings are stable from prior exam. There is no evidence of bony destruction or periosteal reaction. Mild multifocal osteoarthritis. Generalized soft tissue edema  throughout the hand. There is more focal soft tissue prominence about the third digit distal interphalangeal joint, unchanged. No soft tissue air or radiopaque foreign body. IMPRESSION: 1. Generalized soft tissue edema throughout the hand. No radiographic findings of osteomyelitis. 2. Erosive changes centered at the third digit distal interphalangeal joint and index finger proximal interphalangeal joint are stable from prior exam. Findings may represent sequela of gout or erosive arthropathy. Electronically Signed   By: Keith Rake M.D.   On: 06/28/2021 20:16   DG Chest Port 1 View  Result Date: 06/28/2021 CLINICAL DATA:  Questionable sepsis EXAM: PORTABLE CHEST 1 VIEW COMPARISON:  05/21/2021 FINDINGS: Cardiomegaly. No confluent airspace opacities, effusions or edema. No acute bony abnormality. IMPRESSION: Cardiomegaly.  No active disease. Electronically Signed   By: Rolm Baptise M.D.   On: 06/28/2021 20:12   _______________________________________________________________________________________________________ Latest  Blood pressure 121/83, pulse (!) 106, temperature 98.9 F (37.2 C), temperature source Oral, resp. rate (!) 23, height 6' (1.829 m), weight 90.7 kg, SpO2 97 %.   Review of Systems:    Pertinent positives include:  Fevers, chills, fatigue,  Constitutional:  No weight loss, night sweats, weight loss  HEENT:  No headaches, Difficulty swallowing,Tooth/dental problems,Sore throat,  No sneezing, itching, ear ache, nasal congestion, post nasal drip,  Cardio-vascular:  No chest pain, Orthopnea, PND, anasarca, dizziness, palpitations.no Bilateral lower extremity swelling  GI:  No heartburn, indigestion, abdominal pain, nausea, vomiting, diarrhea, change in bowel habits, loss of appetite, melena, blood in stool, hematemesis Resp:  no shortness of breath at rest. No dyspnea on exertion, No excess mucus, no productive cough, No non-productive cough, No coughing up of blood.No change in  color of mucus.No wheezing. Skin:  no rash or lesions. No jaundice GU:  no dysuria, change in color of urine, no urgency or frequency. No straining to urinate.  No flank pain.  Musculoskeletal:  No joint pain or no joint swelling. No decreased range of motion. No back pain.  Psych:  No change in mood or affect. No depression or anxiety. No memory loss.  Neuro: no localizing neurological complaints, no tingling, no weakness, no double vision, no gait abnormality, no slurred speech, no confusion  All systems reviewed and apart from [place] all are negative _______________________________________________________________________________________________ Past Medical History:   Past Medical History:  Diagnosis Date   Chronic atrial fibrillation ([place])    a. Afib dates back to at least  2002 when reviewing prior EKG with EKGs from 2014 onward showing Afib; b. CHADS2VASc at least 4 (CHF, HTN, age x 2)   Chronic diastolic CHF (congestive heart failure) ([place])    a. TTE 5/19: EF 70-75%, mod LVH, near complete obliteration of the mid and apical LV cavity (can be seen in apical hypertrophic CM), mildly dilated LA, RV cavity size nl, RV wall thickness nl, RVSF mildly reduced, mildly dilated RA; b. 02/2018 Limited echo w/ definity: EF 50-65%, no apical hypertrophic CM.   Gout    Hypertension       Past Surgical History:  Procedure Laterality Date   CHOLECYSTECTOMY N/A 01/13/2017   Procedure: LAPAROSCOPIC CHOLECYSTECTOMY;  Surgeon: Jules Husbands, MD;  Location: [facility] ORS;  Service: General;  Laterality: N/A;    Social History:  Ambulatory  cane,       reports that he has never smoked. He has never used smokeless tobacco. He reports that he does not drink alcohol and does not use drugs.    Family History:  Family History  Problem Relation Age of Onset   Stroke Mother    ______________________________________________________________________________________________ Allergies: Allergies   Allergen Reactions   Cortisone Anaphylaxis   Triamcinolone     Other reaction(s): Unknown    Prior to Admission medications   Medication Sig Start Date End Date Taking? Authorizing Provider  apixaban (ELIQUIS) 2.5 MG TABS tablet Take 2.5 mg by mouth 2 (two) times daily. 12/27/19   [provider]  aspirin 325 MG tablet Take 325 mg by mouth daily.    [provider]  atorvastatin (LIPITOR) 40 MG tablet Take 1 tablet by mouth daily.    [provider]  cloNIDine (CATAPRES) 0.1 MG tablet Take 1 tablet by mouth 2 (two) times daily. 05/28/20   [provider]  colchicine 0.6 MG tablet Take 0.6 mg by mouth daily as needed (for gout flares). 12/27/19   [provider]  diphenhydrAMINE-zinc acetate (BENADRYL) cream Apply 1 application topically 2 (two) times daily as needed for itching. 04/14/21 04/14/22  [provider]  famotidine (PEPCID) 20 MG tablet Take 1 tablet (20 mg total) by mouth 2 (two) times daily. 02/15/21   Carrie Mew, MD  finasteride (PROSCAR) 5 MG tablet Take 1 tablet (5 mg total) by mouth daily. 08/21/20   Zara Council A, PA-C  furosemide (LASIX) 40 MG tablet Take 1 tablet (40 mg total) by mouth daily as needed for fluid. 05/24/21   Terrilee Croak, MD  HYDROcodone-acetaminophen (NORCO/VICODIN) 5-325 MG tablet Take one tablet at night for pain; may take up to every 6 hours as needed for pain if not working or driving 92/44/62   [provider]  isosorbide mononitrate (IMDUR) 30 MG 24 hr tablet Take 1 tablet (30 mg total) by mouth daily. 05/24/21   Terrilee Croak, MD  metoprolol tartrate (LOPRESSOR) 100 MG tablet Take 1 tablet (100 mg total) by mouth 2 (two) times daily. 05/24/21 08/22/21  Terrilee Croak, MD  NYSTATIN powder APPLY  POWDER TOPICALLY TWICE DAILY 09/26/19   Nori Riis, PA-C    ___________________________________________________________________________________________________ Physical Exam: Vitals with  BMI 06/28/2021 06/28/2021 06/28/2021  Height - - -  Weight - - -  BMI - - -  Systolic [phone]  Diastolic 83 84 85  Pulse - - 106     1. General:  in No Acute distress Chronically ill *well *cachectic *toxic acutely ill -appearing 2. Psychological: Alert and  Oriented 3. Head/ENT:  Dry Mucous  Membranes                          Head Non traumatic, neck supple                          Poor Dentition 4. SKIN: decreased Skin turgor,  Skin clean Dry and intact redness swelling of eight hand       Tofi of right elbow    Chronic swelling of legs   5. Heart: Regular rate and rhythm no Murmur, no Rub or gallop 6. Lungs:, no wheezes or crackles   7. Abdomen: Soft, non-tender, Non distended   obese   8. Lower extremities: no clubbing, cyanosis, no edema 9. Neurologically Grossly intact, moving all 4 extremities equally   10. MSK: Normal range of motion    Chart has been reviewed  ______________________________________________________________________________________________  Assessment/Plan 80 y.o. male with medical history significant of HTN, HLD, A. fib on Eliquis, gout.  CKD stage IIIb diastolic CHF  Admitted for  Sepsis due to recurrent cellulitis of right hand  Present on Admission:  Sepsis ([place]) -  -SIRS criteria met with  elevated white blood cell count,       Component Value Date/Time   WBC 12.5 (H) 06/28/2021 1951   LYMPHSABS 1.3 06/28/2021 1951   LYMPHSABS 0.8 (L) 10/22/2013 0517     tachycardia   ,   fever   RR >20 Today's Vitals   06/28/21 2206 06/28/21 2206 06/28/21 2230 06/28/21 2300  BP: 124/85  123/84 121/83  Pulse: (!) 106     Resp: 18  (!) 24 (!) 23  Temp: 98.9 F (37.2 C)     TempSrc: Oral     SpO2: 97%     Weight:      Height:      PainSc:  0-No pain      -Most likely source being:  Cellulitis, soft tissue infection,         - Obtain serial lactic acid and procalcitonin level.  - Initiated IV antibiotics in ER: Antibiotics Given (last 72  hours)     Date/Time Action Medication Dose Rate   06/28/21 2006 New Bag/Given   cefTRIAXone (ROCEPHIN) 2 g in sodium chloride 0.9 % 100 mL IVPB 2 g 200 mL/hr   06/28/21 2023 New Bag/Given   vancomycin (VANCOCIN) IVPB 1000 mg/200 mL premix 1,000 mg 200 mL/hr   06/28/21 2207 New Bag/Given   vancomycin (VANCOCIN) IVPB 1000 mg/200 mL premix 1,000 mg 200 mL/hr       Will continue  on : cefepime vanc and metromidazole for tonight ID consult in AM   - await results of blood and urine culture  - Rehydrate aggressively     12:04 AM   Hypertension - allow permissive HTN for now Run out of clonidine restart meds slowly as bp allow    Gout - continue colchicine   CKD (chronic kidney disease), stage III (HCC) -  -chronic avoid nephrotoxic medications such as NSAIDs, Vanco Zosyn combo,  avoid hypotension, continue to follow renal function   Chronic diastolic congestive heart failure (HCC) - - currently appears to be slightly on the dry side, hold home diuretics for tonight and restart when appears euvolemic, carefuly follow fluid status and Cr   Cellulitis - -admit per  cellulitis protocol will       continue current antibiotic choice      plain films showed:  no evidence of air  no evidence of osteomyelitis   no    foreign   objects       Will obtain MRSA screening,       obtain blood cultures       further antibiotic adjustment pending above results   Atrial fibrillation with RVR ([place]) -responded with IV fluids resume beta-blocker if blood pressure allows continue Eliquis    Constipation - check KUB, bowel regimen  Lower back pain obtain images will benefit from pt ot    Other plan as per orders.  DVT prophylaxis:  eliquis    Code Status:    Code Status: Prior  DNR/DNI  comfort care as per patient   I had personally discussed CODE STATUS with patient       Family Communication:   Family not at  Bedside    Disposition Plan:  To home once workup is complete and patient is  stable   Following barriers for discharge:                            Electrolytes corrected                                                           Pain controlled with PO medications                               Afebrile, white count improving able to transition to PO antibiotics                             Will need to be able to tolerate PO                                                       Will need consultants to evaluate patient prior to discharge    Would benefit from PT/OT eval prior to DC  Ordered                   Swallow eval - SLP ordered                   Diabetes care coordinator                   Transition of care consulted                   Nutrition    consulted                  Wound care  consulted                   Palliative care    consulted                   Behavioral health  consulted                    Consults called: will need ID in AM, emailed Dr. Ramon Dredge  Admission  status:  ED Disposition     ED Disposition  Admit   Condition  --   [place]: [place] [[id number]]  Level of Care: Progressive Cardiac [106]  Admit to Progressive based on following criteria: CARDIOVASCULAR & THORACIC of moderate stability with acute coronary syndrome symptoms/low risk myocardial infarction/hypertensive urgency/arrhythmias/heart failure potentially compromising stability and stable post cardiovascular intervention patients.  Covid Evaluation: Asymptomatic Screening Protocol (No Symptoms)  Diagnosis: Sepsis [facility]) [[id number]]  Admitting Physician: Toy Baker [[id number]]  Attending Physician: Toy Baker [[id number]]  Estimated length of stay: past midnight tomorrow  Certification:: I certify this patient will need inpatient services for at least 2 midnights           inpatient     I Expect 2 midnight stay secondary to severity of patient's current illness need for inpatient interventions justified by the following:   hemodynamic instability despite optimal treatment (tachycardia  )  Severe lab/radiological/exam abnormalities including:    celulitis and extensive comorbidities including:     CHF  CKD   Chronic anticoagulation  That are currently affecting medical management.   I expect  patient to be hospitalized for 2 midnights requiring inpatient medical care.  Patient is at high risk for adverse outcome (such as loss of life or disability) if not treated.  Indication for inpatient stay as follows:    Hemodynamic instability despite maximal medical therapy,     Need for IV antibiotics, IV fluids,      Level of care   progressive  tele indefinitely please discontinue once patient no longer qualifies COVID-19 Labs     Precautions: admitted as  asymptomatic screening protocol*   PPE: Used by the provider:   N95 eye Goggles,  Gloves  Kingslee Dowse 06/29/2021, 12:41 AM    [facility]     after 2 AM please page floor coverage PA If 7AM-7PM, please contact the day team taking care of the patient using [email or web address]   Patient was evaluated in the context of the global COVID-19 pandemic, which necessitated consideration that the patient might be at risk for infection with the SARS-CoV-2 virus that causes COVID-19. Institutional protocols and algorithms that pertain to the evaluation of patients at risk for COVID-19 are in a state of rapid change based on information released by regulatory bodies including the CDC and federal and state organizations. These policies and algorithms were followed during the patient's care.

## 2021-06-28 NOTE — Sepsis Progress Note (Signed)
Elink following for Sepsis Protocol 

## 2021-06-28 NOTE — Progress Notes (Signed)
CODE SEPSIS - PHARMACY COMMUNICATION  **Broad Spectrum Antibiotics should be administered within 1 hour of Sepsis diagnosis**  Time Code Sepsis Called/Page Received: 1946  Antibiotics Ordered:  Vancomycin 2 g (ordered as 1 g followed by 1 g) x1 Ceftriaxone 2 g IV x1  Time of 1st antibiotic administration: 2006   Sherilyn Banker ,PharmD Clinical Pharmacist  06/28/2021  7:52 PM

## 2021-06-28 NOTE — Progress Notes (Signed)
PHARMACY -  BRIEF ANTIBIOTIC NOTE   Pharmacy has received consult(s) for vancomycin from an ED provider.  The patient's profile has been reviewed for ht/wt/allergies/indication/available labs.    One time order(s) placed for Vancomycin 2 g (ordered as vancomycin 1g followed by 1g)  Further antibiotics/pharmacy consults should be ordered by admitting physician if indicated.                       Thank you, Forde Dandy Crixus Mcaulay 06/28/2021  7:51 PM

## 2021-06-29 ENCOUNTER — Other Ambulatory Visit: Payer: Self-pay

## 2021-06-29 ENCOUNTER — Encounter: Payer: Self-pay | Admitting: Internal Medicine

## 2021-06-29 ENCOUNTER — Inpatient Hospital Stay: Payer: Medicare Other

## 2021-06-29 DIAGNOSIS — I480 Paroxysmal atrial fibrillation: Secondary | ICD-10-CM

## 2021-06-29 LAB — LACTIC ACID, PLASMA: Lactic Acid, Venous: 0.9 mmol/L (ref 0.5–1.9)

## 2021-06-29 LAB — TROPONIN I (HIGH SENSITIVITY): Troponin I (High Sensitivity): 25 ng/L — ABNORMAL HIGH (ref <18)

## 2021-06-29 LAB — CBC WITH DIFFERENTIAL/PLATELET
Abs Immature Granulocytes: 0.04 10*3/uL (ref 0.00–0.07)
Basophils Absolute: 0 10*3/uL (ref 0.0–0.1)
Basophils Relative: 0 %
Eosinophils Absolute: 0 10*3/uL (ref 0.0–0.5)
Eosinophils Relative: 0 %
HCT: 37.7 % — ABNORMAL LOW (ref 39.0–52.0)
Hemoglobin: 13 g/dL (ref 13.0–17.0)
Immature Granulocytes: 0 %
Lymphocytes Relative: 12 %
Lymphs Abs: 1.3 10*3/uL (ref 0.7–4.0)
MCH: 29 pg (ref 26.0–34.0)
MCHC: 34.5 g/dL (ref 30.0–36.0)
MCV: 84.2 fL (ref 80.0–100.0)
Monocytes Absolute: 1.1 10*3/uL — ABNORMAL HIGH (ref 0.1–1.0)
Monocytes Relative: 11 %
Neutro Abs: 7.8 10*3/uL — ABNORMAL HIGH (ref 1.7–7.7)
Neutrophils Relative %: 77 %
Platelets: 179 10*3/uL (ref 150–400)
RBC: 4.48 MIL/uL (ref 4.22–5.81)
RDW: 16.9 % — ABNORMAL HIGH (ref 11.5–15.5)
WBC: 10.2 10*3/uL (ref 4.0–10.5)
nRBC: 0 % (ref 0.0–0.2)

## 2021-06-29 LAB — PHOSPHORUS: Phosphorus: 2.9 mg/dL (ref 2.5–4.6)

## 2021-06-29 LAB — PROCALCITONIN: Procalcitonin: <0.1 ng/mL

## 2021-06-29 LAB — COMPREHENSIVE METABOLIC PANEL
ALT: 7 U/L (ref 0–44)
AST: 13 U/L — ABNORMAL LOW (ref 15–41)
Albumin: 3 g/dL — ABNORMAL LOW (ref 3.5–5.0)
Alkaline Phosphatase: 60 U/L (ref 38–126)
Anion gap: 8 (ref 5–15)
BUN: 22 mg/dL (ref 8–23)
CO2: 22 mmol/L (ref 22–32)
Calcium: 8.8 mg/dL — ABNORMAL LOW (ref 8.9–10.3)
Chloride: 109 mmol/L (ref 98–111)
Creatinine, Ser: 1.31 mg/dL — ABNORMAL HIGH (ref 0.61–1.24)
GFR, Estimated: 54 mL/min — ABNORMAL LOW (ref >60)
Glucose, Bld: 115 mg/dL — ABNORMAL HIGH (ref 70–99)
Potassium: 3.3 mmol/L — ABNORMAL LOW (ref 3.5–5.1)
Sodium: 139 mmol/L (ref 135–145)
Total Bilirubin: 1.1 mg/dL (ref 0.3–1.2)
Total Protein: 6.9 g/dL (ref 6.5–8.1)

## 2021-06-29 LAB — MAGNESIUM
Magnesium: 2.1 mg/dL (ref 1.7–2.4)
Magnesium: 2 mg/dL (ref 1.7–2.4)

## 2021-06-29 LAB — URINALYSIS, COMPLETE (UACMP) WITH MICROSCOPIC
Bacteria, UA: NONE SEEN
Bilirubin Urine: NEGATIVE
Glucose, UA: NEGATIVE mg/dL
Ketones, ur: NEGATIVE mg/dL
Leukocytes,Ua: NEGATIVE
Nitrite: NEGATIVE
Protein, ur: 30 mg/dL — AB
Specific Gravity, Urine: 1.015 (ref 1.005–1.030)
pH: 5.5 (ref 5.0–8.0)

## 2021-06-29 LAB — RESP PANEL BY RT-PCR (FLU A&B, COVID) ARPGX2
Influenza A by PCR: NEGATIVE
Influenza B by PCR: NEGATIVE
SARS Coronavirus 2 by RT PCR: NEGATIVE

## 2021-06-29 LAB — TSH: TSH: 0.809 u[IU]/mL (ref 0.350–4.500)

## 2021-06-29 IMAGING — CR DG ABDOMEN 1V
2 series · 2 of 2 positions shown · non-contrast
Comparison: None.

CLINICAL DATA: Constipation, low back pain

EXAM:
ABDOMEN - 1 VIEW

[abdomen kub (1 of 2)]
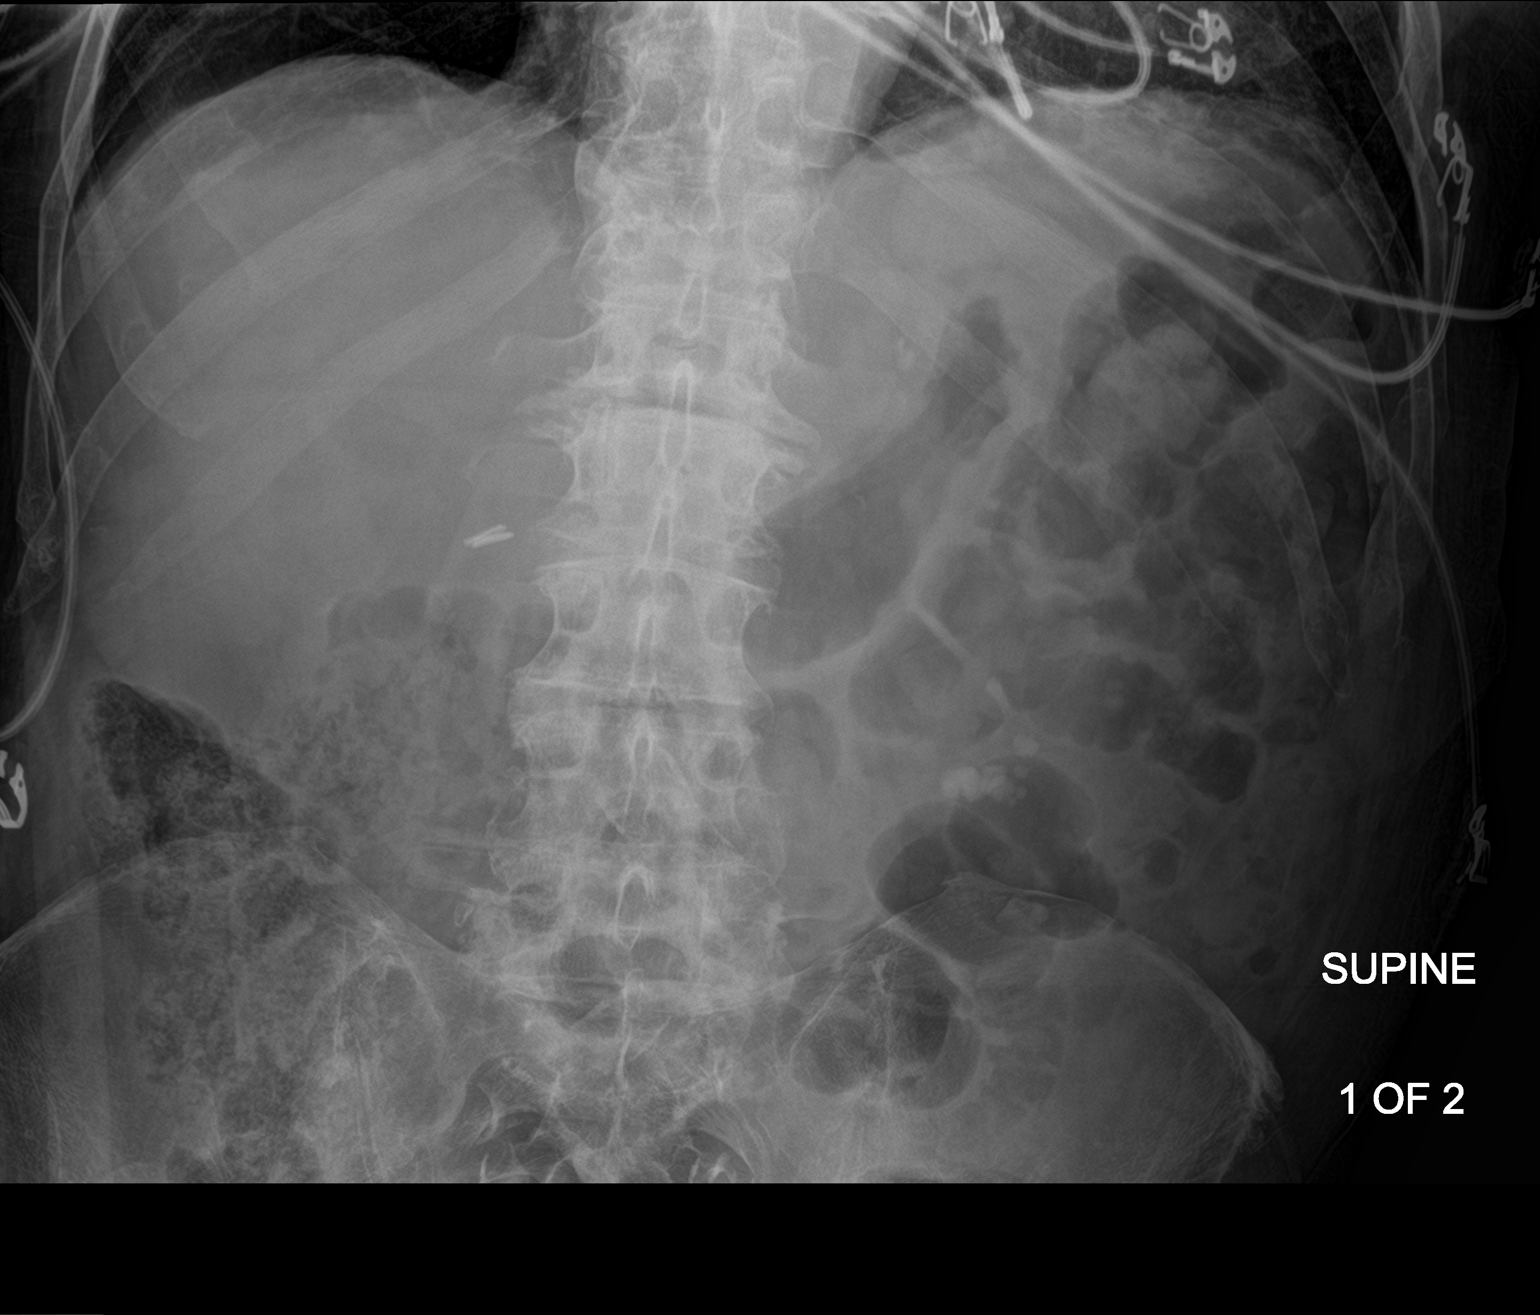

[abdomen kub (2 of 2)]
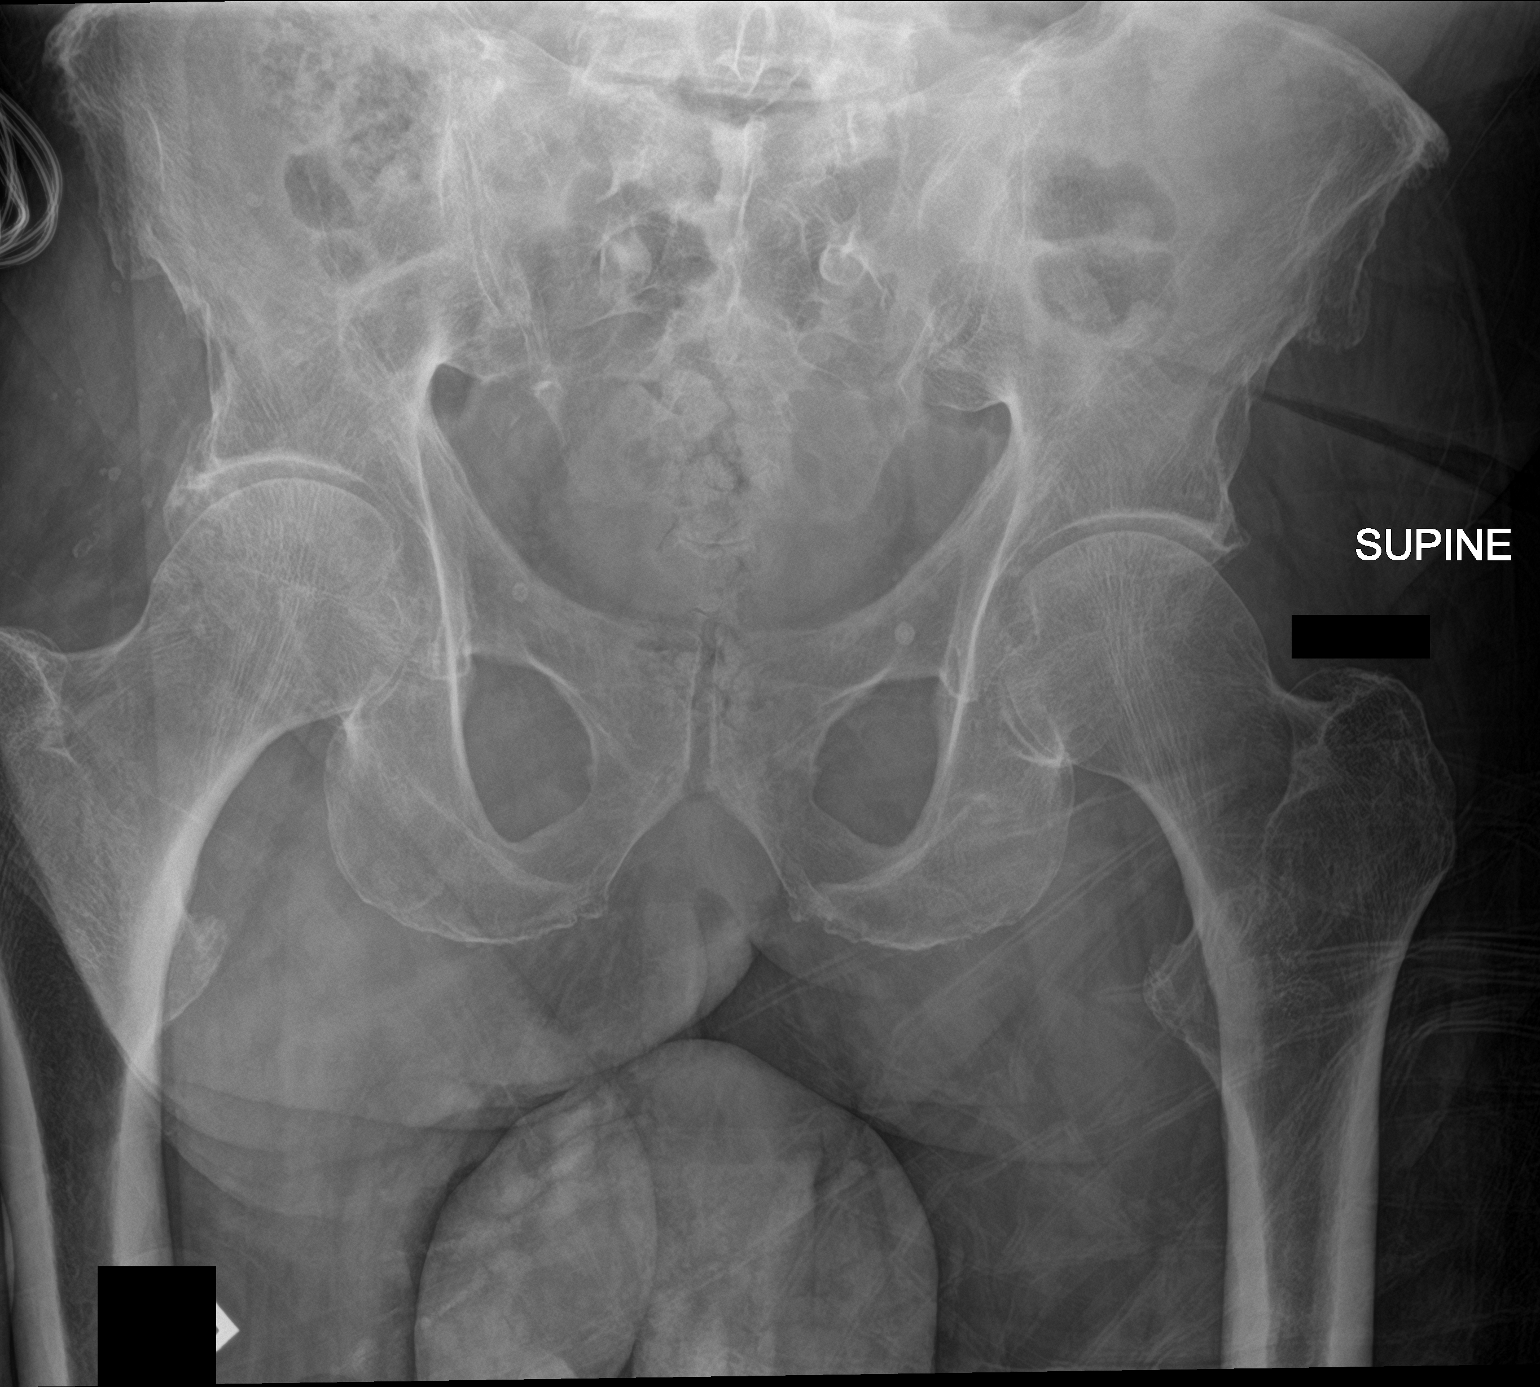

[2 of 2 positions shown; findings below may reference images not displayed]

FINDINGS: Moderate stool burden throughout the colon. There is a non
obstructive bowel gas pattern. No supine evidence of free air. No
organomegaly or suspicious calcification. No acute bony abnormality.
Prior cholecystectomy.
IMPRESSION: Moderate stool burden.  No acute findings.

## 2021-06-29 IMAGING — CR DG LUMBAR SPINE 2-3V
3 series · 3 of 3 positions shown · non-contrast
Comparison: None.

CLINICAL DATA: Constipation, low back pain

EXAM:
LUMBAR SPINE - 2-3 VIEW

[l-spine ap]
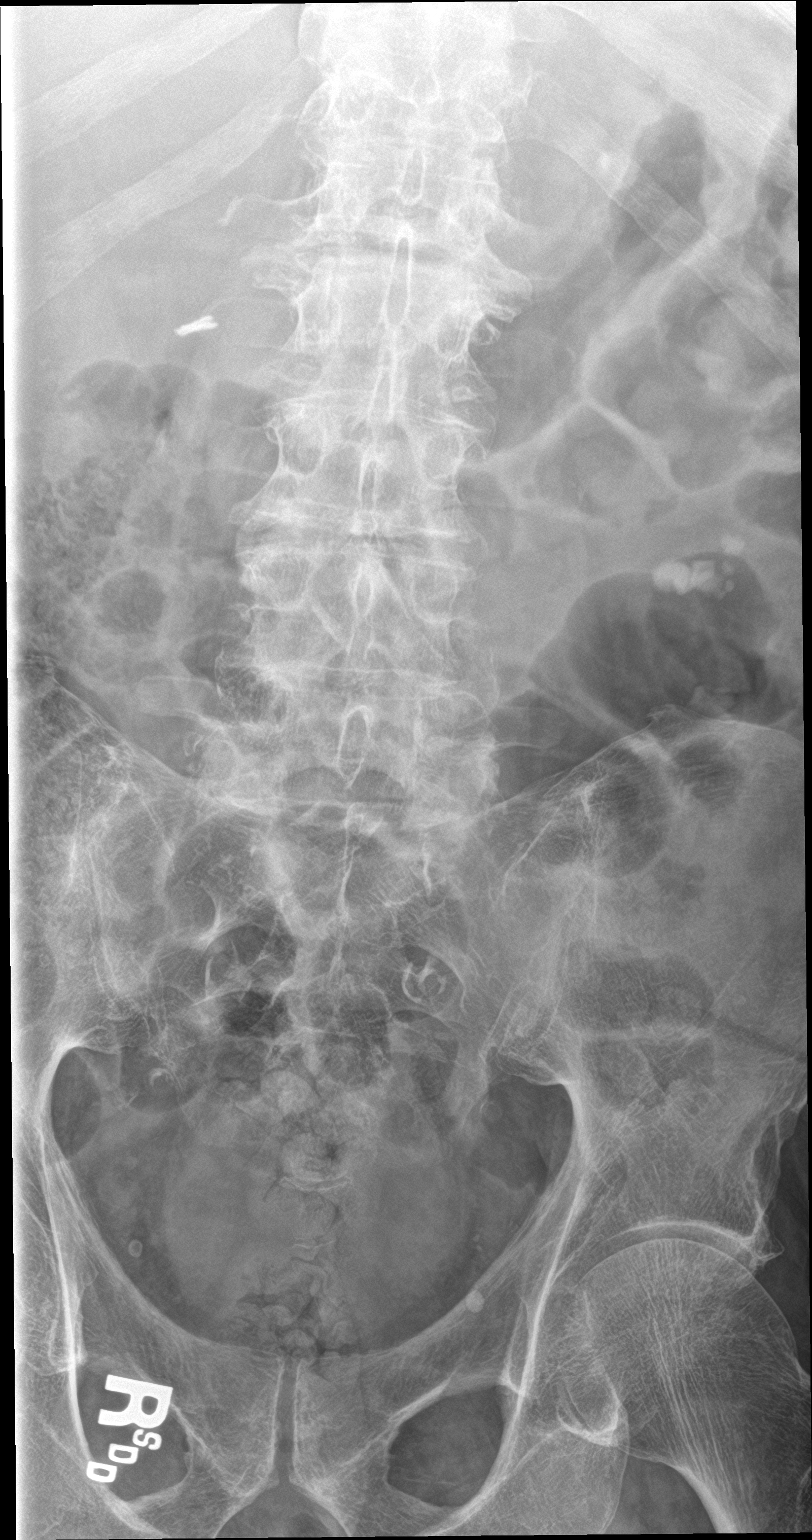

[l-spine lat]
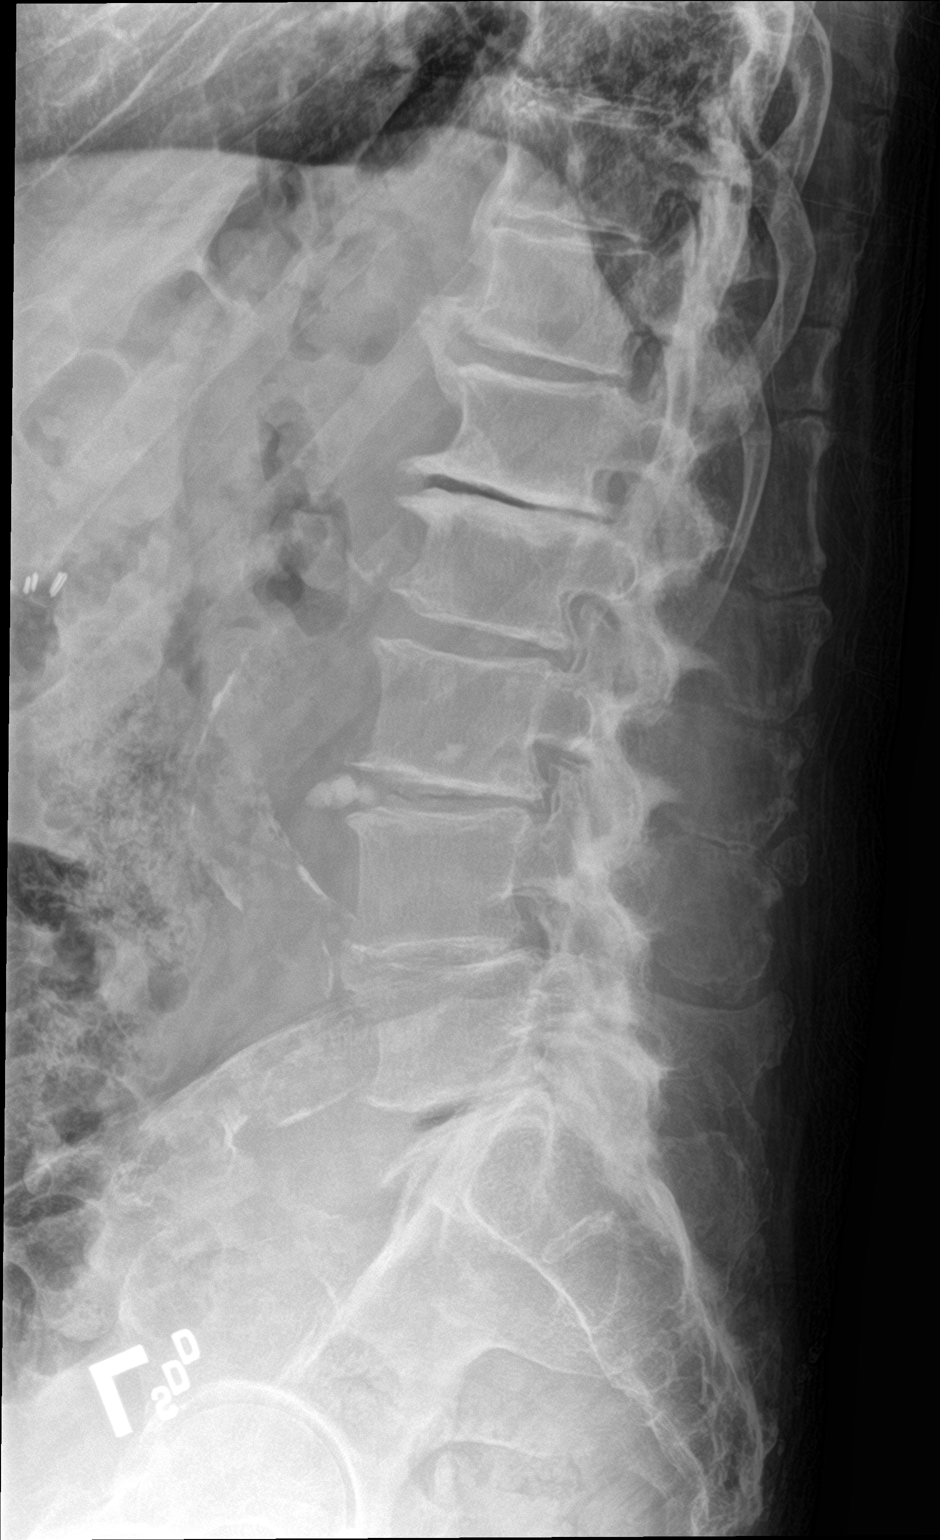

[l-spine spot]
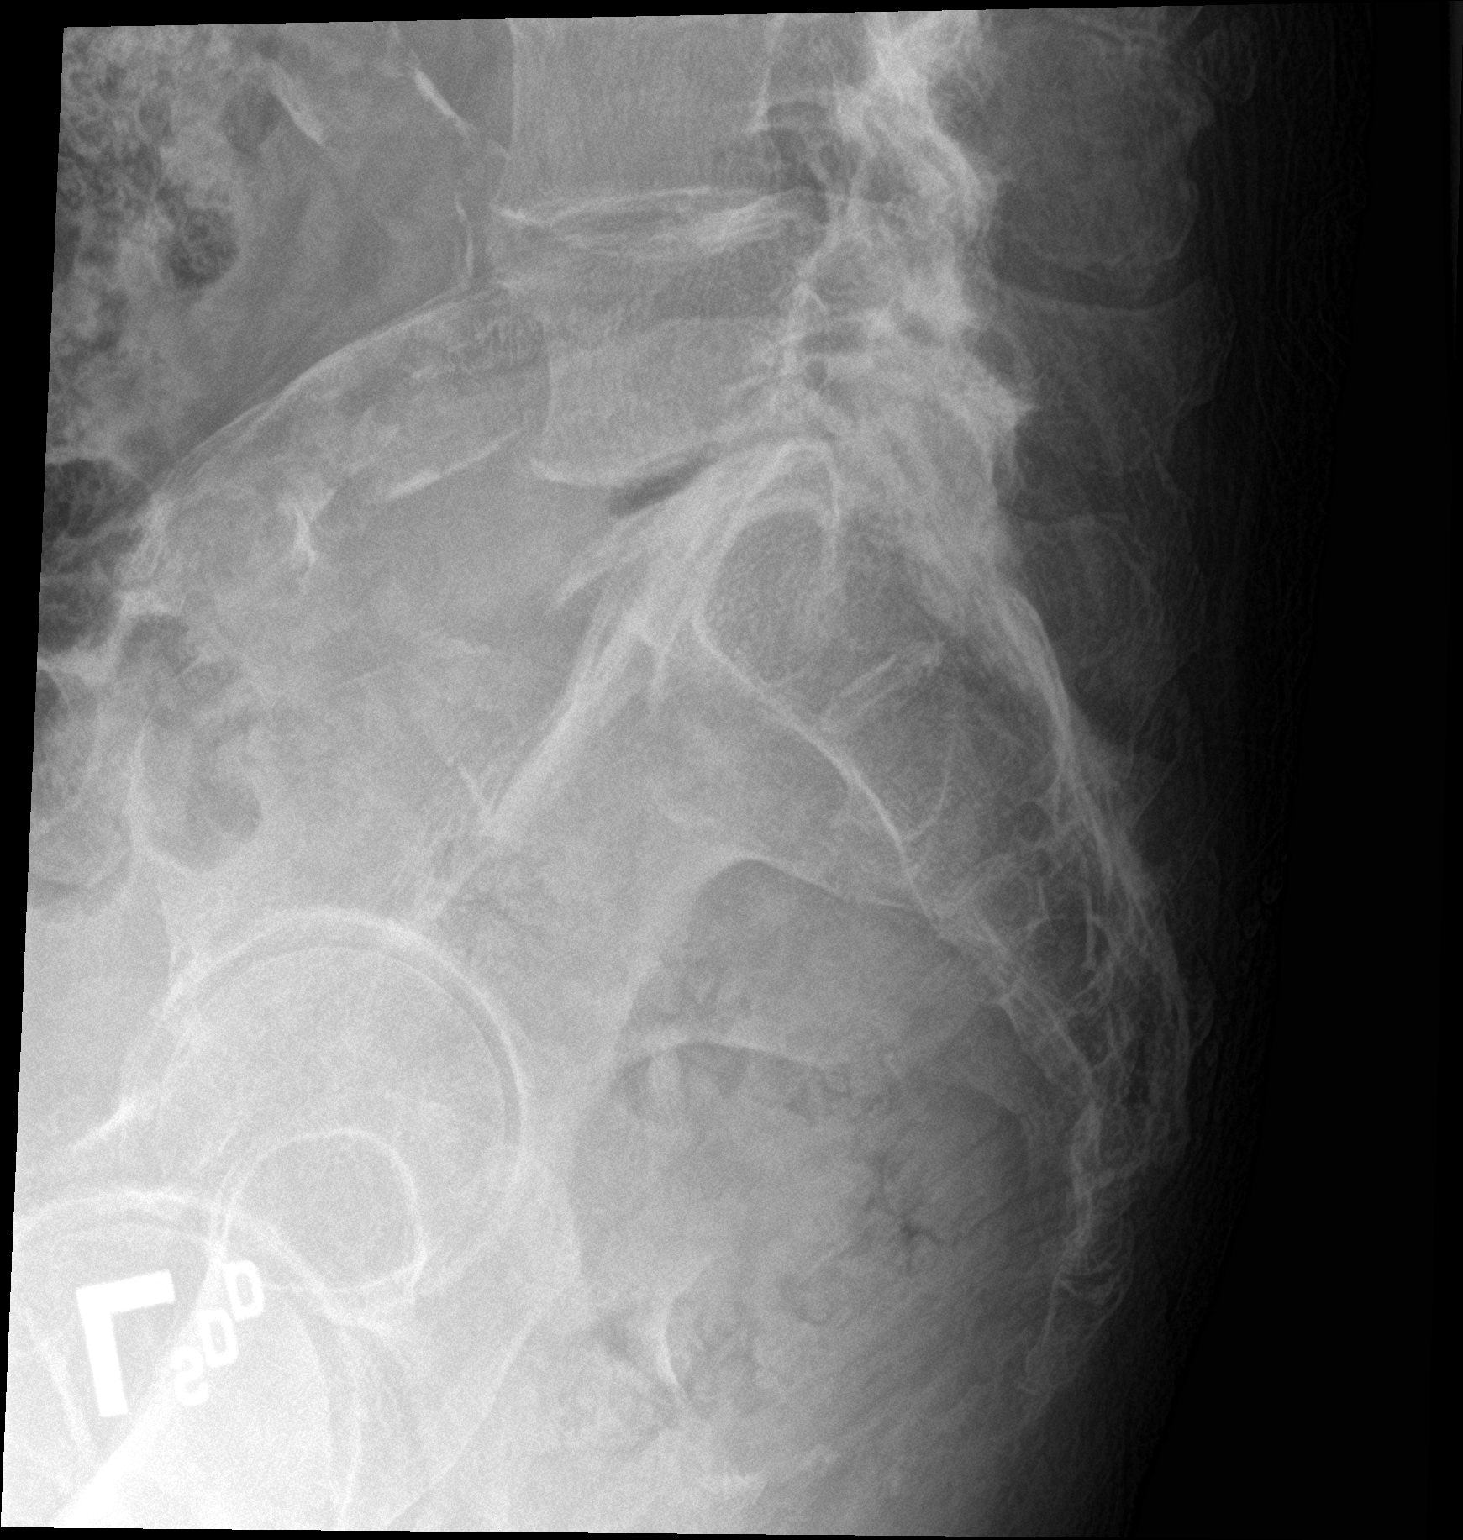

[3 of 3 positions shown; findings below may reference images not displayed]

FINDINGS: Diffuse degenerative disc and facet disease throughout the lumbar
spine. Slight anterolisthesis of L4 on L5. No fracture. SI joints
symmetric and unremarkable. Aortic calcifications. No visible
aneurysm.
IMPRESSION: Diffuse degenerative disc and facet disease. Slight anterolisthesis
at L4-5. No acute bony abnormality.

## 2021-06-29 MED ORDER — SODIUM CHLORIDE 0.9 % IV SOLN: 75.0000 mL/h | INTRAVENOUS | Status: DC

## 2021-06-29 MED ORDER — ACETAMINOPHEN 650 MG RE SUPP: 650.0000 mg | Freq: Four times a day (QID) | RECTAL | Status: DC | PRN

## 2021-06-29 MED ORDER — FAMOTIDINE 20 MG PO TABS
20.0000 mg | ORAL_TABLET | Freq: Two times a day (BID) | ORAL | Status: DC
  Administered 2021-06-29 – 2021-07-01 (×6): 20 mg via ORAL
  Filled 2021-06-29 (×6): qty 1

## 2021-06-29 MED ORDER — SODIUM CHLORIDE 0.9 % IV SOLN
75.0000 mL/h | INTRAVENOUS | Status: DC
  Administered 2021-06-29: 75 mL/h via INTRAVENOUS

## 2021-06-29 MED ORDER — COLCHICINE 0.6 MG PO TABS
1.2000 mg | ORAL_TABLET | Freq: Once | ORAL | Status: AC
  Administered 2021-06-29: 1.2 mg via ORAL
  Filled 2021-06-29: qty 2

## 2021-06-29 MED ORDER — COLCHICINE 0.6 MG PO TABS
0.6000 mg | ORAL_TABLET | Freq: Every day | ORAL | Status: DC
  Administered 2021-06-29: 0.6 mg via ORAL
  Filled 2021-06-29 (×2): qty 1

## 2021-06-29 MED ORDER — HYDROCODONE-ACETAMINOPHEN 5-325 MG PO TABS: 1.0000 | ORAL_TABLET | ORAL | Status: DC | PRN

## 2021-06-29 MED ORDER — METOPROLOL TARTRATE 50 MG PO TABS
50.0000 mg | ORAL_TABLET | Freq: Two times a day (BID) | ORAL | Status: DC
  Administered 2021-06-29 – 2021-06-30 (×4): 50 mg via ORAL
  Filled 2021-06-29 (×4): qty 1

## 2021-06-29 MED ORDER — DIPHENHYDRAMINE HCL 25 MG PO CAPS: 25.0000 mg | ORAL_CAPSULE | Freq: Two times a day (BID) | ORAL | Status: DC

## 2021-06-29 MED ORDER — DIPHENHYDRAMINE HCL 25 MG PO CAPS
25.0000 mg | ORAL_CAPSULE | Freq: Two times a day (BID) | ORAL | Status: DC
  Administered 2021-06-29 – 2021-07-01 (×4): 25 mg via ORAL
  Filled 2021-06-29 (×4): qty 1

## 2021-06-29 MED ORDER — FINASTERIDE 5 MG PO TABS
5.0000 mg | ORAL_TABLET | Freq: Every day | ORAL | Status: DC
  Administered 2021-06-29 – 2021-07-01 (×3): 5 mg via ORAL
  Filled 2021-06-29 (×3): qty 1

## 2021-06-29 MED ORDER — ALLOPURINOL 100 MG PO TABS
50.0000 mg | ORAL_TABLET | Freq: Every day | ORAL | Status: DC
  Administered 2021-06-29 – 2021-07-01 (×3): 50 mg via ORAL
  Filled 2021-06-29 (×3): qty 0.5

## 2021-06-29 MED ORDER — APIXABAN 2.5 MG PO TABS
2.5000 mg | ORAL_TABLET | Freq: Two times a day (BID) | ORAL | Status: DC
  Administered 2021-06-29 – 2021-07-01 (×5): 2.5 mg via ORAL
  Filled 2021-06-29 (×5): qty 1

## 2021-06-29 MED ORDER — ACETAMINOPHEN 325 MG PO TABS
650.0000 mg | ORAL_TABLET | Freq: Four times a day (QID) | ORAL | Status: DC | PRN
  Administered 2021-06-29: 650 mg via ORAL
  Filled 2021-06-29: qty 2

## 2021-06-29 MED ORDER — ISOSORBIDE MONONITRATE ER 30 MG PO TB24
30.0000 mg | ORAL_TABLET | Freq: Every day | ORAL | Status: DC
  Administered 2021-06-29 – 2021-07-01 (×3): 30 mg via ORAL
  Filled 2021-06-29 (×3): qty 1

## 2021-06-29 MED ORDER — SODIUM CHLORIDE 0.9 % IV SOLN
2.0000 g | Freq: Two times a day (BID) | INTRAVENOUS | Status: DC
  Administered 2021-06-29 (×2): 2 g via INTRAVENOUS
  Filled 2021-06-29 (×3): qty 2

## 2021-06-29 MED ORDER — PREDNISONE 20 MG PO TABS
20.0000 mg | ORAL_TABLET | Freq: Two times a day (BID) | ORAL | Status: DC
  Administered 2021-06-29 – 2021-06-30 (×2): 20 mg via ORAL
  Filled 2021-06-29 (×2): qty 1

## 2021-06-29 MED ORDER — ASPIRIN EC 325 MG PO TBEC
325.0000 mg | DELAYED_RELEASE_TABLET | Freq: Every day | ORAL | Status: DC
  Administered 2021-06-29 – 2021-07-01 (×3): 325 mg via ORAL
  Filled 2021-06-29 (×3): qty 1

## 2021-06-29 MED ORDER — BISACODYL 10 MG RE SUPP
10.0000 mg | Freq: Every day | RECTAL | Status: DC | PRN
  Filled 2021-06-29: qty 1

## 2021-06-29 MED ORDER — VANCOMYCIN HCL 1250 MG/250ML IV SOLN
1250.0000 mg | INTRAVENOUS | Status: DC
  Filled 2021-06-29: qty 250

## 2021-06-29 MED ORDER — ATORVASTATIN CALCIUM 20 MG PO TABS
40.0000 mg | ORAL_TABLET | Freq: Every day | ORAL | Status: DC
  Administered 2021-06-29 – 2021-07-01 (×3): 40 mg via ORAL
  Filled 2021-06-29 (×3): qty 2

## 2021-06-29 MED ORDER — POTASSIUM CHLORIDE CRYS ER 20 MEQ PO TBCR
40.0000 meq | EXTENDED_RELEASE_TABLET | Freq: Once | ORAL | Status: AC
  Administered 2021-06-29: 40 meq via ORAL
  Filled 2021-06-29: qty 2

## 2021-06-29 MED ORDER — FENTANYL CITRATE PF 50 MCG/ML IJ SOSY: 12.5000 ug | PREFILLED_SYRINGE | INTRAMUSCULAR | Status: DC | PRN

## 2021-06-29 MED ORDER — SODIUM CHLORIDE 0.9 % IV SOLN: INTRAVENOUS | Status: DC

## 2021-06-29 MED ORDER — POLYETHYLENE GLYCOL 3350 17 G PO PACK: 17.0000 g | PACK | Freq: Every day | ORAL | Status: DC | PRN

## 2021-06-29 MED ORDER — FUROSEMIDE 40 MG PO TABS: 40.0000 mg | ORAL_TABLET | Freq: Every day | ORAL | Status: DC | PRN

## 2021-06-29 NOTE — Evaluation (Signed)
Physical Therapy Evaluation Patient Details Name: George Bolton MRN: [id number] DOB: 11/16/1940 Today's Date: 06/29/2021   History of Present Illness  Pt is an 80 y/o M admitted on 06/28/21 after presenting with swollen R hand/wrist. Pt being treated for sepsis 2/2 recurrent cellulitis of R hand. Last admission for the same in July was at the time diagnosed with cellulitis of right forearm treated with IV antibiotics. PMH: HTN, HLD, a-fib on eliquis, gout, CKD stage 3B, diastolic CHF  Clinical Impression  Pt seen for PT evaluation with co-tx with OT. Prior to admission pt was ambulatory within the home with PRN use of QD and RW. Pt lives with his wife (son reports she's in remission) & son assists as much as possible (he works from home). Pt presents with RUE swelling distal to R elbow & scrotal edema (son reports this is chronic). Pt requires min assist for bed mobility & short distance gait with RW, and mod assist for sit>stand transfers. Pt presents with impaired posture as noted below, impaired balance, & decreased activity tolerance. Educated pt on recommendation of Pt would benefit from STR upon d/c to maximize independence with functional mobility, reduce fall risk, & decrease caregiver burden prior to return home.     Follow Up Recommendations SNF;Supervision/Assistance - 24 hour    Equipment Recommendations   (TBD in next venue)    Recommendations for Other Services       Precautions / Restrictions Precautions Precautions: Fall Restrictions Weight Bearing Restrictions: No      Mobility  Bed Mobility Overal bed mobility: Needs Assistance Bed Mobility: Supine to Sit     Supine to sit: Min assist;HOB elevated     General bed mobility comments: bed rails, extra time to upright trunk    Transfers Overall transfer level: Needs assistance Equipment used: Rolling walker (2 wheeled) Transfers: Sit to/from [facility] Sit to Stand: Mod assist Stand pivot  transfers: Mod assist       General transfer comment: assistance to power up to standing  Ambulation/Gait Ambulation/Gait assistance: Min assist Gait Distance (Feet): 10 Feet Assistive device: Rolling walker (2 wheeled) Gait Pattern/deviations: Decreased step length - left;Decreased step length - right;Decreased stride length;Decreased dorsiflexion - right;Decreased dorsiflexion - left;Shuffle Gait velocity: decreased   General Gait Details: decreased heel strike BLE (L>R)  Stairs            Wheelchair Mobility    Modified Rankin (Stroke Patients Only)       Balance Overall balance assessment: Needs assistance Sitting-balance support: Feet supported;Bilateral upper extremity supported Sitting balance-Leahy Scale: Fair     Standing balance support: Bilateral upper extremity supported;During functional activity Standing balance-Leahy Scale: Fair Standing balance comment: BUE support on RW with min assist                             Pertinent Vitals/Pain Pain Assessment: Faces Faces Pain Scale: Hurts a little bit Pain Location: RUE Pain Descriptors / Indicators: Discomfort Pain Intervention(s): Monitored during session;Repositioned    Home Living Family/patient expects to be discharged to:: Private residence Living Arrangements: Spouse/significant other Available Help at Discharge: Family;Available 24 hours/day Type of Home: House Home Access: Stairs to enter Entrance Stairs-Rails: Left Entrance Stairs-Number of Steps: 3 Home Layout: One level Home Equipment: Walker - 2 wheels;Bedside commode;Cane - quad      Prior Function Level of Independence: Needs assistance   Gait / Transfers Assistance Needed: Denies falls in the  past 6 months. Ambulatory with PRN use of QC vs RW (primarily QC). Son endorses pt has suffled gait.           Hand Dominance   Dominant Hand: Right    Extremity/Trunk Assessment   Upper Extremity Assessment Upper  Extremity Assessment: Generalized weakness (swollen joints on BUE fingers, RUE swollen distal to R elbow)    Lower Extremity Assessment Lower Extremity Assessment: Generalized weakness    Cervical / Trunk Assessment Cervical / Trunk Assessment: Kyphotic (forward head (pt unable to hold head up for forward gaze))  Communication   Communication:  (Difficult to understand at times (pt only has 12 teeth))  Cognition Arousal/Alertness: Awake/alert Behavior During Therapy: WFL for tasks assessed/performed Overall Cognitive Status: Within Functional Limits for tasks assessed                                        General Comments General comments (skin integrity, edema, etc.): HR 113-137 bpm during session    Exercises     Assessment/Plan    PT Assessment Patient needs continued PT services  PT Problem List Decreased strength;Decreased mobility;Decreased activity tolerance;Decreased balance;Decreased knowledge of use of DME;Decreased range of motion       PT Treatment Interventions DME instruction;Therapeutic activities;Modalities;Gait training;Therapeutic exercise;Patient/family education;Stair training;Balance training;Functional mobility training;Neuromuscular re-education;Manual techniques    PT Goals (Current goals can be found in the Care Plan section)  Acute Rehab PT Goals Patient Stated Goal: get better PT Goal Formulation: With patient Time For Goal Achievement: 07/13/21 Potential to Achieve Goals: Good    Frequency Min 2X/week   Barriers to discharge Decreased caregiver support;Inaccessible home environment steps to enter home, family limited in their ability to assist pt    Co-evaluation PT/OT/SLP Co-Evaluation/Treatment: Yes Reason for Co-Treatment: Complexity of the patient's impairments (multi-system involvement);For patient/therapist safety;To address functional/ADL transfers PT goals addressed during session: Mobility/safety with  mobility;Balance;Proper use of DME         AM-PAC PT "6 Clicks" Mobility  Outcome Measure Help needed turning from your back to your side while in a flat bed without using bedrails?: A Little Help needed moving from lying on your back to sitting on the side of a flat bed without using bedrails?: A Little Help needed moving to and from a bed to a chair (including a wheelchair)?: A Little Help needed standing up from a chair using your arms (e.g., wheelchair or bedside chair)?: A Lot Help needed to walk in hospital room?: A Little Help needed climbing 3-5 steps with a railing? : A Lot 6 Click Score: 16    End of Session   Activity Tolerance: Patient tolerated treatment well Patient left: in chair;with chair alarm set;with call bell/phone within reach;with nursing/sitter in room Nurse Communication: Mobility status PT Visit Diagnosis: Unsteadiness on feet (R26.81);Muscle weakness (generalized) (M62.81);Difficulty in walking, not elsewhere classified (R26.2)    Time: [id number] PT Time Calculation (min) (ACUTE ONLY): 26 min   Charges:   PT Evaluation $PT Eval Moderate Complexity: [place], PT, DPT 06/29/21, 1:43 PM   Waunita Schooner 06/29/2021, 1:41 PM

## 2021-06-29 NOTE — ED Notes (Signed)
Pt son Ebbie Latus phone number [phone]. Any questions about care call him.

## 2021-06-29 NOTE — Plan of Care (Signed)
  Problem: Education: Goal: Knowledge of General Education information will improve Description: Including pain rating scale, medication(s)/side effects and non-pharmacologic comfort measures Outcome: Progressing   Problem: Health Behavior/Discharge Planning: Goal: Ability to manage health-related needs will improve Outcome: Progressing   Problem: Clinical Measurements: Goal: Ability to maintain clinical measurements within normal limits will improve Outcome: Progressing Goal: Will remain free from infection Outcome: Progressing Goal: Diagnostic test results will improve Outcome: Progressing Goal: Respiratory complications will improve Outcome: Progressing Goal: Cardiovascular complication will be avoided Outcome: Progressing   Problem: Activity: Goal: Risk for activity intolerance will decrease Outcome: Progressing   Problem: Nutrition: Goal: Adequate nutrition will be maintained Outcome: Progressing   Problem: Coping: Goal: Level of anxiety will decrease Outcome: Progressing   Problem: Elimination: Goal: Will not experience complications related to bowel motility Outcome: Progressing Goal: Will not experience complications related to urinary retention Outcome: Progressing   Problem: Pain Managment: Goal: General experience of comfort will improve Outcome: Progressing   Problem: Safety: Goal: Ability to remain free from injury will improve Outcome: Progressing   Problem: Skin Integrity: Goal: Risk for impaired skin integrity will decrease Outcome: Progressing   Problem: Clinical Measurements: Goal: Ability to avoid or minimize complications of infection will improve Outcome: Progressing   Problem: Skin Integrity: Goal: Skin integrity will improve Outcome: Progressing   Problem: Education: Goal: Ability to demonstrate management of disease process will improve Outcome: Progressing Goal: Ability to verbalize understanding of medication therapies will  improve Outcome: Progressing Goal: Individualized Educational Video(s) Outcome: Progressing   Problem: Activity: Goal: Capacity to carry out activities will improve Outcome: Progressing   Problem: Cardiac: Goal: Ability to achieve and maintain adequate cardiopulmonary perfusion will improve Outcome: Progressing

## 2021-06-29 NOTE — ED Notes (Signed)
Pt report given to [employer].

## 2021-06-29 NOTE — ED Notes (Signed)
Pt had urinated in bed, staff assisted to help clean pt and change linens. Pt unable to roll self. New linens and brief placed. External catheter placed on pt.

## 2021-06-29 NOTE — Progress Notes (Signed)
George Bolton  [id number] DOB: Nov 27, 1940 DOA: 06/28/2021 PCP: Denton Lank, MD    Brief Narrative:  80 year old with a history of HTN, HLD, atrial fibrillation on chronic Eliquis, gout,'s CKD stage IIIb, and diastolic congestive heart failure who presented to the ED with redness and swelling of the right hand and wrist.  He had been admitted the hospital for the same issue in July at which time he was diagnosed with cellulitis of the right forearm and treated with IV antibiotics along with colchicine.  Consultants:  None  Code Status: NO CODE BLUE  Antimicrobials:  Cefepime 9/3 > Vancomycin 9/3 > Ceftriaxone 9/3 Flagyl 9/3  DVT prophylaxis: Eliquis  Subjective: The patient is in no acute distress at the time of my visit.  He does report ongoing pain in the right hand wrist and forearm.  There is no significant erythema of the affected hand or arm.  He denies chest pain or shortness of breath.  Assessment & Plan:  Recurrent right hand/arm pain - cellulitis v/s acute gout flair  On my exam I am more suspicious that this represents an acute flare of severe uncontrolled gout -I do not find clinical signs to convince me this is a true cellulitis -I will discontinue antibiotics and monitor him closely -we will treat him for an acute gout flare -on detailed questioning and get the distinct impression the patient has not been consistently taking his medication at home  Gout with acute severe right hand/wrist flare Given severity of symptoms will utilize prednisone as well as colchicine -continue allopurinol as he is reportedly on this chronically  ? Anaphylaxis to cortisol The patient's record indicates a possible prior anaphylactic reaction to "cortisol" -while not impossible this is highly unlikely -given the severity of his acute gout flare we will give him a trial of prednisone, premedicated with Benadryl, and follow him closely as an inpatient  CKD stage IIIb Renal function  holding steady at present  Chronic diastolic CHF Relatively euvolemic at present  Hypokalemia Supplement and follow trend -magnesium is normal  HTN Blood pressure trending upward, likely due to pain -treat gout flare and follow trend without adjusting blood pressure medication for now  HLD  Chronic atrial fibrillation with acute RVR Continue usual Eliquis dose  Constipation Initiate bowel regimen   Family Communication:  Status is: Inpatient  Remains inpatient appropriate because:Inpatient level of care appropriate due to severity of illness  Dispo: The patient is from: Home              Anticipated d/c is to: Home              Patient currently is not medically stable to d/c.   Difficult to place patient  Objective: Blood pressure (!) 140/102, pulse (!) 107, temperature 98.9 F (37.2 C), temperature source Oral, resp. rate (!) 21, height 6' (1.829 m), weight 90.7 kg, SpO2 94 %.  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 with 2+ edema  of right hand and wrist without erythema -tender to touch right wrist and hand -no significant swelling further up the arm  CBC: Recent Labs  Lab 06/28/21 1951 06/29/21 0544  WBC 12.5* 10.2  NEUTROABS 9.7* 7.8*  HGB 14.3 13.0  HCT 43.2 37.7*  MCV 82.4 84.2  PLT 209 0000000   Basic Metabolic Panel: Recent Labs  Lab 06/28/21 1951 06/29/21 0544  NA 138 139  K 3.6 3.3*  CL 107 109  CO2  21* 22  GLUCOSE 132* 115*  BUN 25* 22  CREATININE 1.49* 1.31*  CALCIUM 9.3 8.8*  MG 2.1 2.0  PHOS  --  2.9   GFR: Estimated Creatinine Clearance: 47.7 mL/min (A) (by C-G formula based on SCr of 1.31 mg/dL (H)).  Liver Function Tests: Recent Labs  Lab 06/28/21 1951 06/29/21 0544  AST 17 13*  ALT 9 7  ALKPHOS 75 60  BILITOT 1.2 1.1  PROT 7.7 6.9  ALBUMIN 3.5 3.0*    Coagulation Profile: Recent Labs  Lab 06/28/21 1951  INR 1.3*     HbA1C: Hgb A1c MFr Bld  Date/Time Value Ref Range Status  04/21/2018 07:17 PM 6.6 (H) 4.8 - 5.6 % Final    Comment:    (NOTE) Pre diabetes:          5.7%-6.4% Diabetes:              >6.4% Glycemic control for   <7.0% adults with diabetes     Recent Results (from the past 240 hour(s))  Resp Panel by RT-PCR (Flu A&B, Covid) Nasopharyngeal Swab     Status: None   Collection Time: 06/28/21  7:46 PM   Specimen: Nasopharyngeal Swab; Nasopharyngeal(NP) swabs in vial transport medium  Result Value Ref Range Status   SARS Coronavirus 2 by RT PCR NEGATIVE NEGATIVE Final    Comment: (NOTE) SARS-CoV-2 target nucleic acids are NOT DETECTED.  The SARS-CoV-2 RNA is generally detectable in upper respiratory specimens during the acute phase of infection. The lowest concentration of SARS-CoV-2 viral copies this assay can detect is 138 copies/mL. A negative result does not preclude SARS-Cov-2 infection and should not be used as the sole basis for treatment or other patient management decisions. A negative result may occur with  improper specimen collection/handling, submission of specimen other than nasopharyngeal swab, presence of viral mutation(s) within the areas targeted by this assay, and inadequate number of viral copies(<138 copies/mL). A negative result must be combined with clinical observations, patient history, and epidemiological information. The expected result is Negative.  Fact Sheet for Patients:   EntrepreneurPulse.com.au  Fact Sheet for Healthcare Providers:  IncredibleEmployment.be  This test is no t yet approved or cleared by the [place] FDA and  has been authorized for detection and/or diagnosis of SARS-CoV-2 by FDA under an Emergency Use Authorization (EUA). This EUA will remain  in effect (meaning this test can be used) for the duration of the COVID-19 declaration under Section 564(b)(1) of the Act, 21 U.S.C.section 360bbb-3(b)(1), unless the authorization is terminated  or revoked sooner.       Influenza A by PCR NEGATIVE NEGATIVE Final   Influenza B by PCR NEGATIVE NEGATIVE Final    Comment: (NOTE) The Xpert Xpress SARS-CoV-2/FLU/RSV plus assay is intended as an aid in the diagnosis of influenza from Nasopharyngeal swab specimens and should not be used as a sole basis for treatment. Nasal washings and aspirates are unacceptable for Xpert Xpress SARS-CoV-2/FLU/RSV testing.  Fact Sheet for Patients: EntrepreneurPulse.com.au  Fact Sheet  for Healthcare Providers: IncredibleEmployment.be  This test is not yet approved or cleared by the [place] and has been authorized for detection and/or diagnosis of SARS-CoV-2 by FDA under an Emergency Use Authorization (EUA). This EUA will remain in effect (meaning this test can be used) for the duration of the COVID-19 declaration under Section 564(b)(1) of the Act, 21 U.S.C. section 360bbb-3(b)(1), unless the authorization is terminated or revoked.  Performed at [facility], [address] [place], [place] [address]      Scheduled Meds:  allopurinol  50 mg Oral Daily   apixaban  2.5 mg Oral BID   aspirin EC  325 mg Oral Daily   atorvastatin  40 mg Oral Daily   colchicine  0.6 mg Oral Daily   famotidine  20 mg Oral BID   finasteride  5 mg Oral Daily   metoprolol tartrate  50 mg Oral BID   Continuous Infusions:  sodium  chloride 75 mL/hr (06/29/21 0101)   ceFEPime (MAXIPIME) IV Stopped (06/29/21 0941)   metronidazole Stopped (06/29/21 0211)   vancomycin       LOS: 1 day   Cherene Altes, MD [facility] Office  [phone] Pager - Text Page per Shea Evans  If 7PM-7AM, please contact night-coverage per Amion 06/29/2021, 10:32 AM

## 2021-06-29 NOTE — Progress Notes (Signed)
Pharmacy Antibiotic Note  George Bolton is a 80 y.o. male admitted on 06/28/2021 with sepsis.  Pharmacy has been consulted for Vanc, Cefepime dosing.  Plan: Cefepime 2 gm IV Q12H ordered to start on 9/4 @ 0100.  Vancomycin 1 gm IV X 1 given in ED on 9/3 @ 2023. Additional Vanc 1 gm IV X 1 given on 9/3 @ 2207 to make total loading dose of 2 gm. Vancomycin 1250 mg IV Q24H ordered to start on 9/4 @ 2000.  AUC = 498 Vanc trough = 13   Height: 6' (182.9 cm) Weight: 90.7 kg (200 lb) IBW/kg (Calculated) : 77.6  Temp (24hrs), Avg:99.9 F (37.7 C), Min:98.9 F (37.2 C), Max:100.8 F (38.2 C)  Recent Labs  Lab 06/28/21 1951  WBC 12.5*  CREATININE 1.49*  LATICACIDVEN 1.6    Estimated Creatinine Clearance: 42 mL/min (A) (by C-G formula based on SCr of 1.49 mg/dL (H)).    Allergies  Allergen Reactions   Cortisone Anaphylaxis   Triamcinolone     Other reaction(s): Unknown    Antimicrobials this admission:   >>    >>   Dose adjustments this admission:   Microbiology results:  BCx:   UCx:    Sputum:    MRSA PCR:   Thank you for allowing pharmacy to be a part of this patient's care.  George Bolton D 06/29/2021 12:59 AM

## 2021-06-29 NOTE — Evaluation (Signed)
Occupational Therapy Evaluation Patient Details Name: George Bolton MRN: [id number] DOB: [birth year] Today's Date: 06/29/2021    History of Present Illness Pt is an 80 y/o M admitted on 06/28/21 after presenting with swollen R hand/wrist. Pt being treated for sepsis 2/2 recurrent cellulitis of R hand. Last admission for the same in July was at the time diagnosed with cellulitis of right forearm treated with IV antibiotics. PMH: HTN, HLD, a-fib on eliquis, gout, CKD stage 3B, diastolic CHF   Clinical Impression   Pt seen for OT/PT co-evaluation this date. Prior to admission, pt was living with spouse in a 1-level home with 3 steps to enter. Pt has access to RW, but primarily used quad cane for functional mobility of household distance (with family encouraging increased use of RW for improved safety however pt declining). Pt reports that his family assists with bathing and IADLs (of note, son vocalized that pt is having difficulty opening medicine containers and following-up on outpatient medical appointments).   Pt currently presents with limited ROM/fine motor coordination of RUE, decreased strength, and decreased activity tolerance. Due to these functional impairments, pt requires MIN A for bed mobility, MIN A for seated LB dressing, MOD A for sit<>stand transfers with RW, and MIN A for functional mobility of short household distances (~38f) with RW. Of note, pt with difficultly grasping RW handle d/t limited ROM/fine motor coordination of RUE. At end of session, pt left in recliner, with RUE elevated and RN present. Pt would benefit from additional skilled OT services to maximize return to PLOF and minimize risk of future falls, injury, caregiver burden, and readmission. Upon discharge, recommend SNF.       Follow Up Recommendations  SNF    Equipment Recommendations  Tub/shower bench       Precautions / Restrictions Precautions Precautions: Fall Restrictions Weight Bearing Restrictions: No       Mobility Bed Mobility Overal bed mobility: Needs Assistance Bed Mobility: Supine to Sit     Supine to sit: Min assist;HOB elevated     General bed mobility comments: bed rails, extra time to upright trunk    Transfers Overall transfer level: Needs assistance Equipment used: Rolling walker (2 wheeled) Transfers: Sit to/from SOmnicareSit to Stand: Mod assist Stand pivot transfers: Mod assist       General transfer comment: assistance to power up to standing    Balance Overall balance assessment: Needs assistance Sitting-balance support: No upper extremity supported;Feet supported Sitting balance-Leahy Scale: Good Sitting balance - Comments: Good sitting balance reaching outside BOS to don/doff socks   Standing balance support: Bilateral upper extremity supported;During functional activity Standing balance-Leahy Scale: Poor Standing balance comment: BUE support on RW with min assist                           ADL either performed or assessed with clinical judgement   ADL Overall ADL's : Needs assistance/impaired                     Lower Body Dressing: Minimal assistance;Sitting/lateral leans Lower Body Dressing Details (indicate cue type and reason): MIN A to don/doff socks in setting of decreased fine motor coordination d/t RUE swelling             Functional mobility during ADLs: Minimal assistance;Rolling walker       Vision Baseline Vision/History: 1 Wears glasses (for reading only) Patient Visual Report: No change from baseline  Pertinent Vitals/Pain Pain Assessment: Faces Faces Pain Scale: Hurts a little bit Pain Location: RUE Pain Descriptors / Indicators: Discomfort Pain Intervention(s): Monitored during session;Repositioned     Hand Dominance Right   Extremity/Trunk Assessment Upper Extremity Assessment Upper Extremity Assessment: Generalized weakness;RUE deficits/detail RUE Deficits  / Details: RUE swollen distal to R elbow RUE: Unable to fully assess due to pain RUE Sensation: WNL RUE Coordination: decreased fine motor LUE Sensation: WNL LUE Coordination: WNL   Lower Extremity Assessment Lower Extremity Assessment: Generalized weakness   Cervical / Trunk Assessment Cervical / Trunk Assessment: Kyphotic (forward head (pt unable to hold head up for forward gaze))   Communication Communication Communication:  (Difficult to understand at times (pt only has 12 teeth))   Cognition Arousal/Alertness: Awake/alert Behavior During Therapy: WFL for tasks assessed/performed Overall Cognitive Status: Within Functional Limits for tasks assessed                                 General Comments: Alert and oriented to self, place, and situation. Pleasant and agreeable throughout   General Comments  HR 113-137 during session; RN aware    Exercises Other Exercises Other Exercises: Education on role of OT, POC, and d/c recommendation        Home Living Family/patient expects to be discharged to:: Private residence Living Arrangements: Spouse/significant other Available Help at Discharge: Family;Available 24 hours/day Type of Home: House Home Access: Stairs to enter [employer] of Steps: 3 Entrance Stairs-Rails: Left Home Layout: One level     Bathroom Shower/Tub: Tub/shower unit         Home Equipment: Environmental consultant - 2 wheels;Bedside commode;Cane - quad          Prior Functioning/Environment Level of Independence: Needs assistance  Gait / Transfers Assistance Needed: Denies falls in the past 6 months. Ambulatory with PRN use of QC vs RW (primarily QC). Son endorses pt has suffled gait. ADL's / Homemaking Assistance Needed: Pt reports family assists with bathing and IADLs. Son vocalized that pt is having difficulty opening medicine containers and following-up on outpatient medical appointments            OT Problem List: Decreased  strength;Decreased range of motion;Decreased activity tolerance;Impaired balance (sitting and/or standing);Impaired UE functional use      OT Treatment/Interventions: Self-care/ADL training;Therapeutic exercise;Energy conservation;DME and/or AE instruction;Therapeutic activities;Patient/family education;Balance training    OT Goals(Current goals can be found in the care plan section) Acute Rehab OT Goals Patient Stated Goal: to regain independence OT Goal Formulation: With patient/family Time For Goal Achievement: 07/13/21 Potential to Achieve Goals: Fair  OT Frequency: Min 1X/week           Co-evaluation PT/OT/SLP Co-Evaluation/Treatment: Yes Reason for Co-Treatment: Complexity of the patient's impairments (multi-system involvement);To address functional/ADL transfers PT goals addressed during session: Mobility/safety with mobility;Balance;Proper use of DME OT goals addressed during session: ADL's and self-care      AM-PAC OT "6 Clicks" Daily Activity     Outcome Measure Help from another person eating meals?: A Little Help from another person taking care of personal grooming?: A Little Help from another person toileting, which includes using toliet, bedpan, or urinal?: A Lot Help from another person bathing (including washing, rinsing, drying)?: A Lot Help from another person to put on and taking off regular upper body clothing?: A Little Help from another person to put on and taking off regular lower body clothing?: A Little 6 Click  Score: 16   End of Session Equipment Utilized During Treatment: Surveyor, mining Communication: Mobility status  Activity Tolerance: Patient tolerated treatment well Patient left: in chair;with call bell/phone within reach;with chair alarm set;with nursing/sitter in room  OT Visit Diagnosis: Unsteadiness on feet (R26.81);Muscle weakness (generalized) (M62.81)                Time: [id number] OT Time Calculation (min): 26 min Charges:  OT  General Charges $OT Visit: 1 Visit OT Evaluation $OT Eval Moderate Complexity: Pyatt Kilkenny, OTR/L [place]

## 2021-06-29 NOTE — ED Notes (Signed)
Pt assisted with eating breakfast 

## 2021-06-30 LAB — COMPREHENSIVE METABOLIC PANEL
ALT: 11 U/L (ref 0–44)
AST: 22 U/L (ref 15–41)
Albumin: 2.8 g/dL — ABNORMAL LOW (ref 3.5–5.0)
Alkaline Phosphatase: 72 U/L (ref 38–126)
Anion gap: 5 (ref 5–15)
BUN: 19 mg/dL (ref 8–23)
CO2: 23 mmol/L (ref 22–32)
Calcium: 9 mg/dL (ref 8.9–10.3)
Chloride: 108 mmol/L (ref 98–111)
Creatinine, Ser: 1.18 mg/dL (ref 0.61–1.24)
GFR, Estimated: >60 mL/min (ref >60)
Glucose, Bld: 155 mg/dL — ABNORMAL HIGH (ref 70–99)
Potassium: 4.1 mmol/L (ref 3.5–5.1)
Sodium: 136 mmol/L (ref 135–145)
Total Bilirubin: 1 mg/dL (ref 0.3–1.2)
Total Protein: 6.5 g/dL (ref 6.5–8.1)

## 2021-06-30 LAB — CBC
HCT: 37 % — ABNORMAL LOW (ref 39.0–52.0)
Hemoglobin: 12.6 g/dL — ABNORMAL LOW (ref 13.0–17.0)
MCH: 28.4 pg (ref 26.0–34.0)
MCHC: 34.1 g/dL (ref 30.0–36.0)
MCV: 83.3 fL (ref 80.0–100.0)
Platelets: 189 10*3/uL (ref 150–400)
RBC: 4.44 MIL/uL (ref 4.22–5.81)
RDW: 16.4 % — ABNORMAL HIGH (ref 11.5–15.5)
WBC: 8 10*3/uL (ref 4.0–10.5)
nRBC: 0 % (ref 0.0–0.2)

## 2021-06-30 LAB — URINE CULTURE: Culture: <10000 — AB

## 2021-06-30 LAB — URIC ACID: Uric Acid, Serum: 7.3 mg/dL (ref 3.7–8.6)

## 2021-06-30 MED ORDER — PREDNISONE 10 MG PO TABS
10.0000 mg | ORAL_TABLET | Freq: Two times a day (BID) | ORAL | Status: DC
  Administered 2021-06-30 – 2021-07-01 (×2): 10 mg via ORAL
  Filled 2021-06-30 (×2): qty 1

## 2021-06-30 MED ORDER — METOPROLOL TARTRATE 50 MG PO TABS
100.0000 mg | ORAL_TABLET | Freq: Two times a day (BID) | ORAL | Status: DC
  Administered 2021-06-30 – 2021-07-01 (×2): 100 mg via ORAL
  Filled 2021-06-30 (×2): qty 2

## 2021-06-30 MED ORDER — SODIUM CHLORIDE 0.9 % IV BOLUS
500.0000 mL | Freq: Once | INTRAVENOUS | Status: AC
  Administered 2021-06-30: 500 mL via INTRAVENOUS

## 2021-06-30 MED ORDER — METOPROLOL TARTRATE 5 MG/5ML IV SOLN
5.0000 mg | Freq: Once | INTRAVENOUS | Status: AC
  Administered 2021-06-30: 5 mg via INTRAVENOUS
  Filled 2021-06-30: qty 5

## 2021-06-30 MED ORDER — COLCHICINE 0.6 MG PO TABS
0.6000 mg | ORAL_TABLET | Freq: Two times a day (BID) | ORAL | Status: DC
  Administered 2021-06-30 – 2021-07-01 (×3): 0.6 mg via ORAL
  Filled 2021-06-30 (×2): qty 1

## 2021-06-30 NOTE — Plan of Care (Signed)
No acute events overnight. Problem: Education: Goal: Knowledge of General Education information will improve Description: Including pain rating scale, medication(s)/side effects and non-pharmacologic comfort measures Outcome: Progressing   Problem: Health Behavior/Discharge Planning: Goal: Ability to manage health-related needs will improve Outcome: Progressing   Problem: Clinical Measurements: Goal: Ability to maintain clinical measurements within normal limits will improve Outcome: Progressing Goal: Will remain free from infection Outcome: Progressing Goal: Diagnostic test results will improve Outcome: Progressing Goal: Respiratory complications will improve Outcome: Progressing Goal: Cardiovascular complication will be avoided Outcome: Progressing   Problem: Activity: Goal: Risk for activity intolerance will decrease Outcome: Progressing   Problem: Nutrition: Goal: Adequate nutrition will be maintained Outcome: Progressing   Problem: Coping: Goal: Level of anxiety will decrease Outcome: Progressing   Problem: Elimination: Goal: Will not experience complications related to bowel motility Outcome: Progressing Goal: Will not experience complications related to urinary retention Outcome: Progressing   Problem: Pain Managment: Goal: General experience of comfort will improve Outcome: Progressing   Problem: Safety: Goal: Ability to remain free from injury will improve Outcome: Progressing   Problem: Skin Integrity: Goal: Risk for impaired skin integrity will decrease Outcome: Progressing   Problem: Clinical Measurements: Goal: Ability to avoid or minimize complications of infection will improve Outcome: Progressing   Problem: Skin Integrity: Goal: Skin integrity will improve Outcome: Progressing   Problem: Education: Goal: Ability to demonstrate management of disease process will improve Outcome: Progressing Goal: Ability to verbalize understanding of  medication therapies will improve Outcome: Progressing Goal: Individualized Educational Video(s) Outcome: Progressing   Problem: Activity: Goal: Capacity to carry out activities will improve Outcome: Progressing   Problem: Cardiac: Goal: Ability to achieve and maintain adequate cardiopulmonary perfusion will improve Outcome: Progressing

## 2021-06-30 NOTE — Progress Notes (Addendum)
George Bolton  [id number] DOB: 1941-03-29 DOA: 06/28/2021 PCP: Denton Lank, MD    Brief Narrative:  80 year old with a history of HTN, HLD, atrial fibrillation on chronic Eliquis, gout, CKD stage IIIb, and diastolic congestive heart failure who presented to the ED with redness and swelling of the right hand and wrist.  He had been admitted to the hospital for the same issue in July at which time he was diagnosed with cellulitis of the right forearm and was treated with IV antibiotics along with colchicine.  Consultants:  None  Code Status: NO CODE BLUE  Antimicrobials:  Cefepime 9/3 > 9/4 Vancomycin 9/3 > 9/4 Ceftriaxone 9/3 Flagyl 9/3  DVT prophylaxis: Eliquis  Subjective: This morning the patient has experienced intermittent tachycardia with heart rates ranging from 150-180.  He denies chest pain shortness of breath or other discomfort.  Blood pressure has been elevated with systolics in the [id number] and diastolics up to [id number]. He feels his R arm and hand are dramatically improved today. He has regained use/movement of the R hand.   Assessment & Plan:  Recurrent right hand/arm pain - acute gout flair (cellulitis and sepsis ruled out) Appears to have been an acute flare of severe uncontrolled gout and not cellulitis - on detailed questioning I get the distinct impression the patient has not been consistently taking his medication at home -tolerating prednisone thus far -continue colchicine at increased dose - uric acid level elevated above goal for this patient which should be <5 - continue allopurinol as he is reportedly on this chronically though I suspect he is not consistently compliant - begin slow wean of steroid given rapid clinical improvement   ? Anaphylaxis to cortisol The patient's record indicates a possible prior anaphylactic reaction to "cortisol" -while not impossible this is highly unlikely -given the severity of his acute gout flare I have given him a trial of  prednisone, premedicated with Benadryl - thus far he is tolerating this without difficulty  CKD stage IIIb Renal function improving with gentle volume expansion  Recent Labs  Lab 06/28/21 1951 06/29/21 0544 06/30/21 0502  CREATININE 1.49* 1.31* 1.18    Chronic diastolic CHF Well compensated if not mildly dehydrated at presentation - gently hydrating today  Hypokalemia Corrected with supplementation - magnesium is normal  HTN Blood pressure not yet at goal - adjust medication again today and follow -will not resume clonidine as this is a poor choice for this gentleman due to its propensity to lead to reflex hypertension when doses are missed  HLD Continue usual home medication  Chronic atrial fibrillation with acute RVR Continue usual Eliquis dose - tx episodes of tachycardia today w/ volume resuscitation as well as increase in rate controlling meds - follow on tele   Constipation continue bowel regimen   Family Communication: no family present at time of exam  Status is: Inpatient  Remains inpatient appropriate because:Inpatient level of care appropriate due to severity of illness  Dispo: The patient is from: Home              Anticipated d/c is to: Home              Patient currently is not medically stable to d/c.   Difficult to place patient  Objective: Blood pressure (!) 161/101, pulse (!) 130, temperature 98.5 F (36.9 C), temperature source Oral, resp. rate 18, height 6' (1.829 m), weight 86.5 kg, SpO2 96 %.  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 -now only trace edema of the right hand and wrist -calor of hand and wrist resolved  CBC: Recent Labs  Lab 06/28/21 1951 06/29/21 0544 06/30/21 0502  WBC 12.5* 10.2 8.0  NEUTROABS 9.7* 7.8*  --   HGB 14.3 13.0 12.6*  HCT 43.2 37.7* 37.0*  MCV 82.4 84.2 83.3  PLT 209 179 [id number]    Basic Metabolic Panel: Recent Labs  Lab 06/28/21 1951 06/29/21 0544 06/30/21 0502  NA 138 139 136  K 3.6 3.3* 4.1  CL 107 109 108  CO2 21* 22 23  GLUCOSE 132* 115* 155*  BUN 25* 22 19  CREATININE 1.49* 1.31* 1.18  CALCIUM 9.3 8.8* 9.0  MG 2.1 2.0  --   PHOS  --  2.9  --     GFR: Estimated Creatinine Clearance: 53 mL/min (by C-G formula based on SCr of 1.18 mg/dL).  Liver Function Tests: Recent Labs  Lab 06/28/21 1951 06/29/21 0544 06/30/21 0502  AST 17 13* 22  ALT '9 7 11  '$ ALKPHOS 75 60 72  BILITOT 1.2 1.1 1.0  PROT 7.7 6.9 6.5  ALBUMIN 3.5 3.0* 2.8*     Coagulation Profile: Recent Labs  Lab 06/28/21 1951  INR 1.3*     Recent Results (from the past 240 hour(s))  Resp Panel by RT-PCR (Flu A&B, Covid) Nasopharyngeal Swab     Status: None   Collection Time: 06/28/21  7:46 PM   Specimen: Nasopharyngeal Swab; Nasopharyngeal(NP) swabs in vial transport medium  Result Value Ref Range Status   SARS Coronavirus 2 by RT PCR NEGATIVE NEGATIVE Final    Comment: (NOTE) SARS-CoV-2 target nucleic acids are NOT DETECTED.  The SARS-CoV-2 RNA is generally detectable in upper respiratory specimens during the acute phase of infection. The lowest concentration of SARS-CoV-2 viral copies this assay can detect is 138 copies/mL. A negative result does not preclude SARS-Cov-2 infection and should not be used as the sole basis for treatment or other patient management decisions. A negative result may occur with  improper specimen collection/handling,  submission of specimen other than nasopharyngeal swab, presence of viral mutation(s) within the areas targeted by this assay, and inadequate number of viral copies(<138 copies/mL). A negative result must be combined with clinical observations, patient history, and epidemiological information. The expected result is Negative.  Fact Sheet for Patients:  EntrepreneurPulse.com.au  Fact Sheet for Healthcare Providers:  IncredibleEmployment.be  This test is no t yet approved or cleared by the [place] FDA and  has been authorized for detection and/or diagnosis of SARS-CoV-2 by FDA under an Emergency Use Authorization (EUA). This EUA will remain  in effect (meaning this test can be used) for the duration of the COVID-19 declaration under Section 564(b)(1) of the Act, 21 U.S.C.section 360bbb-3(b)(1), unless the authorization is terminated  or revoked sooner.       Influenza A by PCR NEGATIVE NEGATIVE Final   Influenza B by PCR NEGATIVE NEGATIVE Final    Comment: (NOTE) The Xpert Xpress SARS-CoV-2/FLU/RSV plus  assay is intended as an aid in the diagnosis of influenza from Nasopharyngeal swab specimens and should not be used as a sole basis for treatment. Nasal washings and aspirates are unacceptable for Xpert Xpress SARS-CoV-2/FLU/RSV testing.  Fact Sheet for Patients: EntrepreneurPulse.com.au  Fact Sheet for Healthcare Providers: IncredibleEmployment.be  This test is not yet approved or cleared by the [place] FDA and has been authorized for detection and/or diagnosis of SARS-CoV-2 by FDA under an Emergency Use Authorization (EUA). This EUA will remain in effect (meaning this test can be used) for the duration of the COVID-19 declaration under Section 564(b)(1) of the Act, 21 U.S.C. section 360bbb-3(b)(1), unless the authorization is terminated or revoked.  Performed at [facility], [address], [place], [place] [address]   Urine Culture     Status: Abnormal   Collection Time: 06/29/21  5:44 AM   Specimen: In/Out Cath Urine  Result Value Ref Range Status   Specimen Description   Final    IN/OUT CATH URINE Performed at [facility], [address] [place], [place] [address]    Special Requests   Final    NONE Performed at [facility], [address] [place], [place] [address]    Culture (A)  Final    <10,000 COLONIES/mL INSIGNIFICANT GROWTH Performed at [facility], [place] [address] [place], [place] [address]    Report Status 06/30/2021 FINAL  Final     Scheduled Meds:  allopurinol  50 mg Oral Daily   apixaban  2.5 mg Oral BID   aspirin EC  325 mg Oral Daily   atorvastatin  40 mg Oral Daily   colchicine  0.6 mg Oral Daily   diphenhydrAMINE  25 mg Oral BID   famotidine  20 mg Oral BID   finasteride  5 mg Oral Daily   isosorbide mononitrate  30 mg Oral Daily   metoprolol tartrate  5 mg Intravenous Once   metoprolol tartrate  50 mg Oral BID   predniSONE  20 mg Oral BID WC   Continuous Infusions:  sodium chloride 10 mL/hr at 06/29/21 1712   sodium chloride       LOS: 2 days   Cherene Altes, MD [facility] Office  [phone] Pager - Text Page per Shea Evans  If 7PM-7AM, please contact night-coverage per Amion 06/30/2021, 9:06 AM

## 2021-06-30 NOTE — Progress Notes (Signed)
Telemetry notified RN that pts HR increasing up to 187bpm. RN went to bedside and assessed pt. Pt denies chest pain, SOB, or discomfort. MD notified. HR down to 129bpm, but remains elevated. Scheduled medications given by RN. RN will continue to monitor and recheck VS.

## 2021-06-30 NOTE — TOC Progression Note (Signed)
Transition of Care [facility]) - Progression Note    Patient Details  Name: George Bolton MRN: [id number] Date of Birth: Sep 17, 1941  Transition of Care [facility]) CM/SW [place], RN Phone Number: 06/30/2021, 10:02 AM  Clinical Narrative:   Patient lives at home with his wife.  States his son can assist him as needed.  He states that his son transports him to appointments and is able to pick up medications at the pharmacy.  He and his son work together to ensure patient takes medications as directed.  He currently has no home health.  RNCM discussed SNF recommendation with patient.  Patient promptly declined SNF and stated that he wants to go home.  He states he has a walker, cane and bedside commode at home.  Care team informed of patient's decision.  TOC contact information given, TOC to follow to discharge.         Expected Discharge Plan and Services                                                 Social Determinants of Health (SDOH) Interventions    Readmission Risk Interventions No flowsheet data found.

## 2021-07-01 ENCOUNTER — Encounter: Payer: Self-pay | Admitting: Internal Medicine

## 2021-07-01 LAB — BASIC METABOLIC PANEL
Anion gap: 9 (ref 5–15)
BUN: 27 mg/dL — ABNORMAL HIGH (ref 8–23)
CO2: 18 mmol/L — ABNORMAL LOW (ref 22–32)
Calcium: 9.4 mg/dL (ref 8.9–10.3)
Chloride: 109 mmol/L (ref 98–111)
Creatinine, Ser: 1.33 mg/dL — ABNORMAL HIGH (ref 0.61–1.24)
GFR, Estimated: 53 mL/min — ABNORMAL LOW (ref >60)
Glucose, Bld: 159 mg/dL — ABNORMAL HIGH (ref 70–99)
Potassium: 4.6 mmol/L (ref 3.5–5.1)
Sodium: 136 mmol/L (ref 135–145)

## 2021-07-01 LAB — CBC
HCT: 41.9 % (ref 39.0–52.0)
Hemoglobin: 14.2 g/dL (ref 13.0–17.0)
MCH: 28.2 pg (ref 26.0–34.0)
MCHC: 33.9 g/dL (ref 30.0–36.0)
MCV: 83.3 fL (ref 80.0–100.0)
Platelets: 219 10*3/uL (ref 150–400)
RBC: 5.03 MIL/uL (ref 4.22–5.81)
RDW: 16.8 % — ABNORMAL HIGH (ref 11.5–15.5)
WBC: 13 10*3/uL — ABNORMAL HIGH (ref 4.0–10.5)
nRBC: 0 % (ref 0.0–0.2)

## 2021-07-01 NOTE — Plan of Care (Signed)
Patient alert and oriented x 4, denies pain with assess. Vitals and HR within normal parameters during shift. No adverse events will continue to monitor.  Problem: Education: Goal: Knowledge of General Education information will improve Description: Including pain rating scale, medication(s)/side effects and non-pharmacologic comfort measures Outcome: Progressing   Problem: Health Behavior/Discharge Planning: Goal: Ability to manage health-related needs will improve Outcome: Progressing   Problem: Clinical Measurements: Goal: Ability to maintain clinical measurements within normal limits will improve Outcome: Progressing Goal: Will remain free from infection Outcome: Progressing Goal: Diagnostic test results will improve Outcome: Progressing Goal: Respiratory complications will improve Outcome: Progressing Goal: Cardiovascular complication will be avoided Outcome: Progressing   Problem: Activity: Goal: Risk for activity intolerance will decrease Outcome: Progressing   Problem: Nutrition: Goal: Adequate nutrition will be maintained Outcome: Progressing   Problem: Coping: Goal: Level of anxiety will decrease Outcome: Progressing   Problem: Skin Integrity: Goal: Risk for impaired skin integrity will decrease Outcome: Progressing   Problem: Clinical Measurements: Goal: Ability to avoid or minimize complications of infection will improve Outcome: Progressing

## 2021-07-01 NOTE — Progress Notes (Signed)
   06/30/21 0936  Assess: MEWS Score  BP (!) 141/107  Pulse Rate (!) 148  SpO2 96 %  O2 Device Room Air  Assess: MEWS Score  MEWS Temp 0  MEWS Systolic 0  MEWS Pulse 3  MEWS RR 0  MEWS LOC 0  MEWS Score 3  MEWS Score Color Yellow  Assess: if the MEWS score is Yellow or Red  Were vital signs taken at a resting state? Yes  Focused Assessment No change from prior assessment  Does the patient meet 2 or more of the SIRS criteria? No  MEWS guidelines implemented *See Row Information* Yes  Treat  MEWS Interventions Administered scheduled meds/treatments  Pain Scale 0-10  Pain Score 0  Pain Intervention(s) Refused  Take Vital Signs  Increase Vital Sign Frequency  Yellow: Q 2hr X 2 then Q 4hr X 2, if remains yellow, continue Q 4hrs  Escalate  MEWS: Escalate Yellow: discuss with charge nurse/RN and consider discussing with provider and RRT  Notify: Charge Nurse/RN  Name of Charge Nurse/RN Notified Sherryl Manges, RN  Date Charge Nurse/RN Notified 06/30/21  Time Charge Nurse/RN Notified 0945  Notify: Provider  Provider Name/Title Joette Catching, MD  Date Provider Notified 06/30/21  Time Provider Notified 0830  Notification Type Page  Notification Reason Change in status  Provider response Evaluate remotely  Date of Provider Response 06/30/21  Time of Provider Response 0840  Assess: SIRS CRITERIA  SIRS Temperature  0  SIRS Pulse 1  SIRS Respirations  0  SIRS WBC 0  SIRS Score Sum  1  Inserted for Knox Saliva RN

## 2021-07-01 NOTE — Discharge Summary (Addendum)
Physician Discharge Summary  George Bolton [id number] DOB: 1941/06/21 DOA: 06/28/2021  PCP: Denton Lank, MD  Admit date: 06/28/2021 Discharge date: 07/01/2021  Discharge disposition: Home   Recommendations for Outpatient Follow-Up:   Follow-up with PCP in 1 week  Discharge Diagnosis:   Active Problems:   Gout   Hypertension   Chronic diastolic congestive heart failure ([place])   Atrial fibrillation with RVR ([place])   CKD (chronic kidney disease), stage III ([place])    Discharge Condition: Stable.  Diet recommendation:  Diet Order             Diet - low sodium heart healthy                     Code Status: Prior     Hospital Course:   Mr. George Bolton is an 80 year old with a history of HTN, HLD, atrial fibrillation on chronic Eliquis, gout, CKD stage IIIb, chronic diastolic congestive heart failure, who presented to the ED with redness and swelling of the right hand and wrist.  He had been admitted to the hospital for the same issue in July at which time he was diagnosed with cellulitis of the right forearm and was treated with IV antibiotics along with colchicine.  Initially, he was treated with empiric IV antibiotics and IV fluids for suspected cellulitis.  However, cellulitis was ruled out on the right hand pain and swelling was attributed to acute gout flare.  He was treated with prednisone with significant improvement in his symptoms.  He had A. fib with RVR but heart rate has improved.  He is deemed stable for discharge to home today.  Importance of medical adherence was emphasized.     Discharge Exam:    Vitals:   06/30/21 2130 07/01/21 0024 07/01/21 0511 07/01/21 0747  BP: (!) 142/97 (!) 148/105 (!) 141/103 (!) 137/103  Pulse: 87 67 89 92  Resp: '19 18 18 16  '$ Temp: 98.1 F (36.7 C) 98.1 F (36.7 C) 98.2 F (36.8 C) 97.9 F (36.6 C)  TempSrc:      SpO2: 99% 97% 95% 94%  Weight:      Height:         GEN: NAD SKIN: Warm and dry EYES:  EOMI ENT: MMM CV: RRR PULM: CTA B ABD: soft, ND, NT, +BS CNS: AAO x 3, non focal EXT: B/l leg edema (?lymphedema). No swelling, erythema or tenderness in the right hand/wrist.  Range of motion in the right wrist joint is normal.   The results of significant diagnostics from this hospitalization (including imaging, microbiology, ancillary and laboratory) are listed below for reference.     Procedures and Diagnostic Studies:   DG Lumbar Spine 2-3 Views  Result Date: 06/29/2021 CLINICAL DATA:  Constipation, low back pain EXAM: LUMBAR SPINE - 2-3 VIEW COMPARISON:  None. FINDINGS: Diffuse degenerative disc and facet disease throughout the lumbar spine. Slight anterolisthesis of L4 on L5. No fracture. SI joints symmetric and unremarkable. Aortic calcifications. No visible aneurysm. IMPRESSION: Diffuse degenerative disc and facet disease. Slight anterolisthesis at L4-5. No acute bony abnormality. Electronically Signed   By: Rolm Baptise M.D.   On: 06/29/2021 01:21   DG Forearm Right  Result Date: 06/28/2021 CLINICAL DATA:  Right hand pain and swelling. Swollen right hand and wrist. Infection. EXAM: RIGHT FOREARM - 2 VIEW COMPARISON:  No dedicated prior forearm imaging. Elbow radiograph 05/21/2021 reviewed. FINDINGS: No erosion, periosteal reaction or bone destruction. There is no evidence of fracture  or other focal bone lesions. There is generalized soft tissue edema about the distal forearm and wrist. Soft tissue edema about the elbow appears improved from prior exam, with persistent focal soft tissue prominence in the region of the olecranon bursa. No soft tissue air or radiopaque foreign body. IMPRESSION: 1. Generalized soft tissue edema about the distal forearm and wrist. Soft tissue edema about the elbow appears improved from prior exam, with persistent focal soft tissue prominence in the region of the olecranon bursa. 2. No soft tissue air or radiopaque foreign body. 3. No radiographic findings of  osteomyelitis. Electronically Signed   By: Keith Rake M.D.   On: 06/28/2021 20:13   DG Abd 1 View  Result Date: 06/29/2021 CLINICAL DATA:  Constipation, low back pain EXAM: ABDOMEN - 1 VIEW COMPARISON:  None. FINDINGS: Moderate stool burden throughout the colon. There is a non obstructive bowel gas pattern. No supine evidence of free air. No organomegaly or suspicious calcification. No acute bony abnormality. Prior cholecystectomy. IMPRESSION: Moderate stool burden.  No acute findings. Electronically Signed   By: Rolm Baptise M.D.   On: 06/29/2021 01:20   DG Hand 2 View Right  Result Date: 06/28/2021 CLINICAL DATA:  Right hand pain and swelling.  Infection. EXAM: RIGHT HAND - 2 VIEW COMPARISON:  Hand radiograph 05/21/2021 FINDINGS: Erosions centered at the third digit distal interphalangeal joint with smoothly marginated borders. Gull wing appearance of the index finger proximal interphalangeal joint. These findings are stable from prior exam. There is no evidence of bony destruction or periosteal reaction. Mild multifocal osteoarthritis. Generalized soft tissue edema throughout the hand. There is more focal soft tissue prominence about the third digit distal interphalangeal joint, unchanged. No soft tissue air or radiopaque foreign body. IMPRESSION: 1. Generalized soft tissue edema throughout the hand. No radiographic findings of osteomyelitis. 2. Erosive changes centered at the third digit distal interphalangeal joint and index finger proximal interphalangeal joint are stable from prior exam. Findings may represent sequela of gout or erosive arthropathy. Electronically Signed   By: Keith Rake M.D.   On: 06/28/2021 20:16   DG Chest Port 1 View  Result Date: 06/28/2021 CLINICAL DATA:  Questionable sepsis EXAM: PORTABLE CHEST 1 VIEW COMPARISON:  05/21/2021 FINDINGS: Cardiomegaly. No confluent airspace opacities, effusions or edema. No acute bony abnormality. IMPRESSION: Cardiomegaly.  No active  disease. Electronically Signed   By: Rolm Baptise M.D.   On: 06/28/2021 20:12     Labs:   Basic Metabolic Panel: Recent Labs  Lab 06/28/21 1951 06/29/21 0544 06/30/21 0502 07/01/21 0535  NA 138 139 136 136  K 3.6 3.3* 4.1 4.6  CL 107 109 108 109  CO2 21* 22 23 18*  GLUCOSE 132* 115* 155* 159*  BUN 25* 22 19 27*  CREATININE 1.49* 1.31* 1.18 1.33*  CALCIUM 9.3 8.8* 9.0 9.4  MG 2.1 2.0  --   --   PHOS  --  2.9  --   --    GFR Estimated Creatinine Clearance: 47 mL/min (A) (by C-G formula based on SCr of 1.33 mg/dL (H)). Liver Function Tests: Recent Labs  Lab 06/28/21 1951 06/29/21 0544 06/30/21 0502  AST 17 13* 22  ALT '9 7 11  '$ ALKPHOS 75 60 72  BILITOT 1.2 1.1 1.0  PROT 7.7 6.9 6.5  ALBUMIN 3.5 3.0* 2.8*   No results for input(s): LIPASE, AMYLASE in the last 168 hours. No results for input(s): AMMONIA in the last 168 hours. Coagulation profile Recent Labs  Lab  06/28/21 1951  INR 1.3*    CBC: Recent Labs  Lab 06/28/21 1951 06/29/21 0544 06/30/21 0502 07/01/21 0535  WBC 12.5* 10.2 8.0 13.0*  NEUTROABS 9.7* 7.8*  --   --   HGB 14.3 13.0 12.6* 14.2  HCT 43.2 37.7* 37.0* 41.9  MCV 82.4 84.2 83.3 83.3  PLT 209 179 189 219   Cardiac Enzymes: No results for input(s): CKTOTAL, CKMB, CKMBINDEX, TROPONINI in the last 168 hours. BNP: Invalid input(s): POCBNP CBG: No results for input(s): GLUCAP in the last 168 hours. D-Dimer No results for input(s): DDIMER in the last 72 hours. Hgb A1c No results for input(s): HGBA1C in the last 72 hours. Lipid Profile No results for input(s): CHOL, HDL, LDLCALC, TRIG, CHOLHDL, LDLDIRECT in the last 72 hours. Thyroid function studies Recent Labs    06/29/21 0544  TSH 0.809   Anemia work up No results for input(s): VITAMINB12, FOLATE, FERRITIN, TIBC, IRON, RETICCTPCT in the last 72 hours. Microbiology Recent Results (from the past 240 hour(s))  Resp Panel by RT-PCR (Flu A&B, Covid) Nasopharyngeal Swab     Status:  None   Collection Time: 06/28/21  7:46 PM   Specimen: Nasopharyngeal Swab; Nasopharyngeal(NP) swabs in vial transport medium  Result Value Ref Range Status   SARS Coronavirus 2 by RT PCR NEGATIVE NEGATIVE Final    Comment: (NOTE) SARS-CoV-2 target nucleic acids are NOT DETECTED.  The SARS-CoV-2 RNA is generally detectable in upper respiratory specimens during the acute phase of infection. The lowest concentration of SARS-CoV-2 viral copies this assay can detect is 138 copies/mL. A negative result does not preclude SARS-Cov-2 infection and should not be used as the sole basis for treatment or other patient management decisions. A negative result may occur with  improper specimen collection/handling, submission of specimen other than nasopharyngeal swab, presence of viral mutation(s) within the areas targeted by this assay, and inadequate number of viral copies(<138 copies/mL). A negative result must be combined with clinical observations, patient history, and epidemiological information. The expected result is Negative.  Fact Sheet for Patients:  EntrepreneurPulse.com.au  Fact Sheet for Healthcare Providers:  IncredibleEmployment.be  This test is no t yet approved or cleared by the [place] FDA and  has been authorized for detection and/or diagnosis of SARS-CoV-2 by FDA under an Emergency Use Authorization (EUA). This EUA will remain  in effect (meaning this test can be used) for the duration of the COVID-19 declaration under Section 564(b)(1) of the Act, 21 U.S.C.section 360bbb-3(b)(1), unless the authorization is terminated  or revoked sooner.       Influenza A by PCR NEGATIVE NEGATIVE Final   Influenza B by PCR NEGATIVE NEGATIVE Final    Comment: (NOTE) The Xpert Xpress SARS-CoV-2/FLU/RSV plus assay is intended as an aid in the diagnosis of influenza from Nasopharyngeal swab specimens and should not be used as a sole basis for  treatment. Nasal washings and aspirates are unacceptable for Xpert Xpress SARS-CoV-2/FLU/RSV testing.  Fact Sheet for Patients: EntrepreneurPulse.com.au  Fact Sheet for Healthcare Providers: IncredibleEmployment.be  This test is not yet approved or cleared by the [place] FDA and has been authorized for detection and/or diagnosis of SARS-CoV-2 by FDA under an Emergency Use Authorization (EUA). This EUA will remain in effect (meaning this test can be used) for the duration of the COVID-19 declaration under Section 564(b)(1) of the Act, 21 U.S.C. section 360bbb-3(b)(1), unless the authorization is terminated or revoked.  Performed at [facility], [address] [place],  [address]  Blood Culture (routine x 2)     Status: None (Preliminary result)   Collection Time: 06/28/21  7:46 PM   Specimen: BLOOD  Result Value Ref Range Status   Specimen Description BLOOD BLOOD LEFT FOREARM  Final   Special Requests   Final    BOTTLES DRAWN AEROBIC AND ANAEROBIC Blood Culture adequate volume   Culture   Final    NO GROWTH 3 DAYS Performed at [facility], [address], [place], [place] [address]    Report Status PENDING  Incomplete  Blood Culture (routine x 2)     Status: None (Preliminary result)   Collection Time: 06/28/21  7:51 PM   Specimen: BLOOD  Result Value Ref Range Status   Specimen Description BLOOD BLOOD RIGHT FOREARM  Final   Special Requests   Final    BOTTLES DRAWN AEROBIC AND ANAEROBIC Blood Culture adequate volume   Culture   Final    NO GROWTH 3 DAYS Performed at [facility], [address] [place], [place] [address]    Report Status PENDING  Incomplete  Urine Culture     Status: Abnormal   Collection Time: 06/29/21  5:44 AM   Specimen: In/Out Cath Urine  Result Value Ref Range Status   Specimen Description   Final    IN/OUT CATH URINE Performed at [facility], [address] [place], [place] [address]    Special Requests   Final    NONE Performed at [facility], [address], [place], [place] [address]    Culture (A)  Final    <10,000 COLONIES/mL INSIGNIFICANT GROWTH Performed at [facility], [place] [address], [place],  [address]    Report Status 06/30/2021 FINAL  Final     Discharge Instructions:   Discharge Instructions     Diet - low sodium heart healthy   Complete by: As directed    Increase activity slowly   Complete by: As directed       Allergies as of 07/01/2021       Reactions   Cortisone Anaphylaxis   Triamcinolone    Other reaction(s): Unknown        Medication List     STOP taking these medications    cloNIDine 0.1 MG tablet Commonly known as: CATAPRES       TAKE these medications    allopurinol 100 MG tablet Commonly known as: ZYLOPRIM Take 50 mg by mouth daily. Notes to patient: Last dose given 07/01/2021 at 0957am   apixaban 2.5 MG Tabs tablet Commonly known as: ELIQUIS Take 2.5 mg by mouth 2 (two) times daily. Notes to patient: Last dose given 07/01/2021 at 0958am   aspirin 325 MG tablet Take 650 mg by mouth daily. Notes to patient: Last dose given 07/01/2021 at 0957am   atorvastatin 40 MG tablet Commonly known as: LIPITOR Take 1 tablet by mouth daily. Notes to patient: Last dose given 07/01/2021 at 0957am   colchicine 0.6 MG tablet Take 0.6 mg by mouth daily as needed (gout flares). Notes to patient: Last dose given 07/01/2021 at 0957am   diphenhydrAMINE-zinc acetate cream Commonly known as: BENADRYL Apply 1 application topically 2 (two) times daily as needed for itching. Notes to patient: Last dose given 07/01/2021 at 0804am   famotidine 20 MG tablet Commonly known as: PEPCID Take 1 tablet (20 mg total) by mouth 2 (two) times daily. Notes to patient: Last dose given 07/01/2021 at 0957am   finasteride 5 MG tablet  Commonly known as: PROSCAR Take 1 tablet  (5 mg total) by mouth daily. Notes to patient: Last dose given 07/02/2021 at 0957am   furosemide 40 MG tablet Commonly known as: LASIX Take 1 tablet (40 mg total) by mouth daily as needed for fluid. Notes to patient: Not given while in hospital.    HYDROcodone-acetaminophen 5-325 MG tablet Commonly known as: NORCO/VICODIN Take one tablet at night for pain; may take up to every 6 hours as needed for pain if not working or driving Notes to patient: Not given while in hospital.    isosorbide mononitrate 30 MG 24 hr tablet Commonly known as: IMDUR Take 30 mg by mouth daily. Notes to patient: Last dose given 07/01/2021 at 0957am   metoprolol tartrate 100 MG tablet Commonly known as: LOPRESSOR Take 1 tablet (100 mg total) by mouth 2 (two) times daily. Notes to patient: Last dose given 07/01/2021 at 0957am   nystatin powder Generic drug: nystatin APPLY  POWDER TOPICALLY TWICE DAILY What changed: See the new instructions. Notes to patient: Not given while in hospital.           Time coordinating discharge: 31 minutes  Signed:  Eudell Mcphee  [facility] 07/01/2021, 9:58 PM   Pager on [email or web address]. If 7PM-7AM, please contact night-coverage at [email or web address]

## 2021-07-01 NOTE — Care Management Important Message (Signed)
Important Message  Patient Details  Name: KALEM NANKERVIS MRN: [id number] Date of Birth: Jul 27, 1941   Medicare Important Message Given:  Yes     Juliann Pulse A Phillippe Orlick 07/01/2021, 1:01 PM

## 2021-07-03 LAB — CULTURE, BLOOD (ROUTINE X 2)
Culture: NO GROWTH
Culture: NO GROWTH
Special Requests: ADEQUATE
Special Requests: ADEQUATE

## 2021-09-09 ENCOUNTER — Ambulatory Visit: Admission: RE | Admit: 2021-09-09 | Payer: Medicare Other | Source: Ambulatory Visit

## 2021-09-23 NOTE — Progress Notes (Deleted)
09/24/2021 10:25 PM   [name] Mar 02, 1941 [id number]  Referring provider: Denton Lank, MD [address] [place],  [place] [address]  No chief complaint on file.  Urological history 1. Complex renal cysts -CT abdomen pelvis with and without contrast on 07/28/2019 shows bilateral kidney stones, relatively small.  He has bilateral Bosniak 1 and 2 cysts along with cortical scarring and volume loss.  No other GU pathology was identified.  Overall this is essentially stable and unchanged from scan from 05/2018 -RUS 08/28/2020 with multiple cysts.  Some with a linear septation in the left and an increase in size of the right upper pole cyst from 4.7 cm to 5.1 cm  2. BPH with LU TS -prostate volume 150 grams  -I PSS *** -managed with finasteride 5 mg daily   3. Bilateral hydroceles   HPI: COURTEZ TWADDLE is a 80 y.o. male who presents today for one year follow up.        Score:  1-7 Mild 8-19 Moderate 20-35 Severe   PMH: Past Medical History:  Diagnosis Date   Chronic atrial fibrillation ([place])    a. Afib dates back to at least 2002 when reviewing prior EKG with EKGs from 2014 onward showing Afib; b. CHADS2VASc at least 4 (CHF, HTN, age x 2)   Chronic diastolic CHF (congestive heart failure) ([place])    a. TTE 5/19: EF 70-75%, mod LVH, near complete obliteration of the mid and apical LV cavity (can be seen in apical hypertrophic CM), mildly dilated LA, RV cavity size nl, RV wall thickness nl, RVSF mildly reduced, mildly dilated RA; b. 02/2018 Limited echo w/ definity: EF 50-65%, no apical hypertrophic CM.   Gout    Hypertension     Surgical History: Past Surgical History:  Procedure Laterality Date   CHOLECYSTECTOMY N/A 01/13/2017   Procedure: LAPAROSCOPIC CHOLECYSTECTOMY;  Surgeon: Jules Husbands, MD;  Location: [facility] ORS;  Service: General;  Laterality: N/A;    Home Medications:  Allergies as of 09/24/2021       Reactions   Cortisone Anaphylaxis    Triamcinolone    Other reaction(s): Unknown        Medication List        Accurate as of September 23, 2021 10:25 PM. If you have any questions, ask your nurse or doctor.          allopurinol 100 MG tablet Commonly known as: ZYLOPRIM Take 50 mg by mouth daily.   apixaban 2.5 MG Tabs tablet Commonly known as: ELIQUIS Take 2.5 mg by mouth 2 (two) times daily.   aspirin 325 MG tablet Take 650 mg by mouth daily.   atorvastatin 40 MG tablet Commonly known as: LIPITOR Take 1 tablet by mouth daily.   colchicine 0.6 MG tablet Take 0.6 mg by mouth daily as needed (gout flares).   diphenhydrAMINE-zinc acetate cream Commonly known as: BENADRYL Apply 1 application topically 2 (two) times daily as needed for itching.   famotidine 20 MG tablet Commonly known as: PEPCID Take 1 tablet (20 mg total) by mouth 2 (two) times daily.   finasteride 5 MG tablet Commonly known as: PROSCAR Take 1 tablet (5 mg total) by mouth daily.   furosemide 40 MG tablet Commonly known as: LASIX Take 1 tablet (40 mg total) by mouth daily as needed for fluid.   HYDROcodone-acetaminophen 5-325 MG tablet Commonly known as: NORCO/VICODIN Take one tablet at night for pain; may take up to every 6 hours as needed  for pain if not working or driving   isosorbide mononitrate 30 MG 24 hr tablet Commonly known as: IMDUR Take 30 mg by mouth daily.   metoprolol tartrate 100 MG tablet Commonly known as: LOPRESSOR Take 1 tablet (100 mg total) by mouth 2 (two) times daily.   nystatin powder Generic drug: nystatin APPLY  POWDER TOPICALLY TWICE DAILY What changed: See the new instructions.        Allergies:  Allergies  Allergen Reactions   Cortisone Anaphylaxis   Triamcinolone     Other reaction(s): Unknown    Family History: Family History  Problem Relation Age of Onset   Stroke Mother     Social History:  reports that he has never smoked. He has never used smokeless tobacco. He reports  that he does not drink alcohol and does not use drugs.  ROS: For pertinent review of systems please refer to history of present illness  Physical Exam: There were no vitals taken for this visit.  Constitutional:  Well nourished. Alert and oriented, No acute distress. HEENT: [place] AT, moist mucus membranes.  Trachea midline Cardiovascular: No clubbing, cyanosis, or edema. Respiratory: Normal respiratory effort, no increased work of breathing. GI: Abdomen is soft, non tender, non distended, no abdominal masses. Liver and spleen not palpable.  No hernias appreciated.  Stool sample for occult testing is not indicated.   GU: No CVA tenderness.  No bladder fullness or masses.  Patient with circumcised/uncircumcised phallus. ***Foreskin easily retracted***  Urethral meatus is patent.  No penile discharge. No penile lesions or rashes. Scrotum without lesions, cysts, rashes and/or edema.  Testicles are located scrotally bilaterally. No masses are appreciated in the testicles. Left and right epididymis are normal. Rectal: Patient with  normal sphincter tone. Anus and perineum without scarring or rashes. No rectal masses are appreciated. Prostate is approximately *** grams, *** nodules are appreciated. Seminal vesicles are normal. Skin: No rashes, bruises or suspicious lesions. Lymph: No inguinal adenopathy. Neurologic: Grossly intact, no focal deficits, moving all 4 extremities. Psychiatric: Normal mood and affect.   Laboratory Data: Lab Results  Component Value Date   WBC 13.0 (H) 07/01/2021   HGB 14.2 07/01/2021   HCT 41.9 07/01/2021   MCV 83.3 07/01/2021   PLT 219 07/01/2021    Lab Results  Component Value Date   CREATININE 1.33 (H) 07/01/2021    Lab Results  Component Value Date   HGBA1C 6.6 (H) 04/21/2018    Pertinent Imaging: RUS pending    Assessment & Plan:    1. BPH with LUTS IPSS score is 8/5, it is stable Continue conservative management, avoiding bladder irritants and  timed voiding's RTC in 12 month for I PSS and PVR  2. Renal cysts RUS with bilateral cysts Will continue with yearly surveillance of the cysts Patient states he missed his appointment with nephrology, but he will call and reschedule We will obtain a BMP at today's visit  3. Kidney stones Asymptomatic, can follow with serial imaging as well Not seen on recent RUS  4.  Bilateral hydroceles Explained to the patient that a hydrocele is a collection of peritoneal fluid between the parietal and visceral layers of the tunica vaginalis, which directly surrounds the testis and spermatic cord.  It is the same layer that forms the peritoneal lining of the abdomen. 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  Zara Council, PA-C  [facility] [address], [place], [place] [address] [phone]

## 2021-09-24 ENCOUNTER — Ambulatory Visit: Payer: Medicare Other | Admitting: Urology

## 2021-09-24 DIAGNOSIS — N281 Cyst of kidney, acquired: Secondary | ICD-10-CM

## 2021-09-24 DIAGNOSIS — N433 Hydrocele, unspecified: Secondary | ICD-10-CM

## 2021-09-24 DIAGNOSIS — N138 Other obstructive and reflux uropathy: Secondary | ICD-10-CM

## 2021-09-26 ENCOUNTER — Encounter: Payer: Self-pay | Admitting: Urology

## 2021-11-21 ENCOUNTER — Emergency Department: Payer: Medicare Other

## 2021-11-21 ENCOUNTER — Other Ambulatory Visit: Payer: Self-pay

## 2021-11-21 DIAGNOSIS — Z23 Encounter for immunization: Secondary | ICD-10-CM

## 2021-11-21 DIAGNOSIS — Z515 Encounter for palliative care: Secondary | ICD-10-CM

## 2021-11-21 DIAGNOSIS — G8194 Hemiplegia, unspecified affecting left nondominant side: Secondary | ICD-10-CM | POA: Diagnosis not present

## 2021-11-21 DIAGNOSIS — Z7982 Long term (current) use of aspirin: Secondary | ICD-10-CM

## 2021-11-21 DIAGNOSIS — Z6823 Body mass index (BMI) 23.0-23.9, adult: Secondary | ICD-10-CM

## 2021-11-21 DIAGNOSIS — Z7901 Long term (current) use of anticoagulants: Secondary | ICD-10-CM

## 2021-11-21 DIAGNOSIS — R809 Proteinuria, unspecified: Secondary | ICD-10-CM | POA: Diagnosis present

## 2021-11-21 DIAGNOSIS — E860 Dehydration: Secondary | ICD-10-CM | POA: Diagnosis present

## 2021-11-21 DIAGNOSIS — M109 Gout, unspecified: Secondary | ICD-10-CM | POA: Diagnosis present

## 2021-11-21 DIAGNOSIS — Z79899 Other long term (current) drug therapy: Secondary | ICD-10-CM

## 2021-11-21 DIAGNOSIS — R627 Adult failure to thrive: Secondary | ICD-10-CM | POA: Diagnosis present

## 2021-11-21 DIAGNOSIS — I130 Hypertensive heart and chronic kidney disease with heart failure and stage 1 through stage 4 chronic kidney disease, or unspecified chronic kidney disease: Secondary | ICD-10-CM | POA: Diagnosis present

## 2021-11-21 DIAGNOSIS — Z66 Do not resuscitate: Secondary | ICD-10-CM | POA: Diagnosis present

## 2021-11-21 DIAGNOSIS — M1711 Unilateral primary osteoarthritis, right knee: Secondary | ICD-10-CM | POA: Diagnosis present

## 2021-11-21 DIAGNOSIS — R29703 NIHSS score 3: Secondary | ICD-10-CM | POA: Diagnosis not present

## 2021-11-21 DIAGNOSIS — R471 Dysarthria and anarthria: Secondary | ICD-10-CM | POA: Diagnosis not present

## 2021-11-21 DIAGNOSIS — E669 Obesity, unspecified: Secondary | ICD-10-CM | POA: Diagnosis present

## 2021-11-21 DIAGNOSIS — E1122 Type 2 diabetes mellitus with diabetic chronic kidney disease: Secondary | ICD-10-CM | POA: Diagnosis present

## 2021-11-21 DIAGNOSIS — W19XXXA Unspecified fall, initial encounter: Secondary | ICD-10-CM | POA: Diagnosis present

## 2021-11-21 DIAGNOSIS — I482 Chronic atrial fibrillation, unspecified: Secondary | ICD-10-CM | POA: Diagnosis present

## 2021-11-21 DIAGNOSIS — N179 Acute kidney failure, unspecified: Principal | ICD-10-CM | POA: Diagnosis present

## 2021-11-21 DIAGNOSIS — N1831 Chronic kidney disease, stage 3a: Secondary | ICD-10-CM | POA: Diagnosis present

## 2021-11-21 DIAGNOSIS — Z20822 Contact with and (suspected) exposure to covid-19: Secondary | ICD-10-CM | POA: Diagnosis present

## 2021-11-21 DIAGNOSIS — S83511A Sprain of anterior cruciate ligament of right knee, initial encounter: Secondary | ICD-10-CM | POA: Diagnosis present

## 2021-11-21 DIAGNOSIS — R339 Retention of urine, unspecified: Secondary | ICD-10-CM | POA: Diagnosis present

## 2021-11-21 DIAGNOSIS — K219 Gastro-esophageal reflux disease without esophagitis: Secondary | ICD-10-CM | POA: Diagnosis present

## 2021-11-21 DIAGNOSIS — E785 Hyperlipidemia, unspecified: Secondary | ICD-10-CM | POA: Diagnosis present

## 2021-11-21 DIAGNOSIS — I6340 Cerebral infarction due to embolism of unspecified cerebral artery: Secondary | ICD-10-CM | POA: Diagnosis not present

## 2021-11-21 DIAGNOSIS — S83241A Other tear of medial meniscus, current injury, right knee, initial encounter: Secondary | ICD-10-CM | POA: Diagnosis present

## 2021-11-21 DIAGNOSIS — E872 Acidosis, unspecified: Secondary | ICD-10-CM | POA: Diagnosis present

## 2021-11-21 DIAGNOSIS — Z9049 Acquired absence of other specified parts of digestive tract: Secondary | ICD-10-CM

## 2021-11-21 DIAGNOSIS — Z5329 Procedure and treatment not carried out because of patient's decision for other reasons: Secondary | ICD-10-CM | POA: Diagnosis not present

## 2021-11-21 DIAGNOSIS — S83411A Sprain of medial collateral ligament of right knee, initial encounter: Secondary | ICD-10-CM | POA: Diagnosis present

## 2021-11-21 DIAGNOSIS — J189 Pneumonia, unspecified organism: Secondary | ICD-10-CM | POA: Diagnosis present

## 2021-11-21 DIAGNOSIS — I5032 Chronic diastolic (congestive) heart failure: Secondary | ICD-10-CM | POA: Diagnosis present

## 2021-11-21 DIAGNOSIS — Z888 Allergy status to other drugs, medicaments and biological substances status: Secondary | ICD-10-CM

## 2021-11-21 DIAGNOSIS — R1310 Dysphagia, unspecified: Secondary | ICD-10-CM | POA: Diagnosis not present

## 2021-11-21 DIAGNOSIS — R29810 Facial weakness: Secondary | ICD-10-CM | POA: Diagnosis not present

## 2021-11-21 DIAGNOSIS — Z823 Family history of stroke: Secondary | ICD-10-CM

## 2021-11-21 LAB — TROPONIN I (HIGH SENSITIVITY)
Troponin I (High Sensitivity): 75 ng/L — ABNORMAL HIGH (ref <18)
Troponin I (High Sensitivity): 88 ng/L — ABNORMAL HIGH (ref <18)

## 2021-11-21 LAB — BASIC METABOLIC PANEL
Anion gap: 12 (ref 5–15)
BUN: 57 mg/dL — ABNORMAL HIGH (ref 8–23)
CO2: 20 mmol/L — ABNORMAL LOW (ref 22–32)
Calcium: 9.4 mg/dL (ref 8.9–10.3)
Chloride: 107 mmol/L (ref 98–111)
Creatinine, Ser: 3.64 mg/dL — ABNORMAL HIGH (ref 0.61–1.24)
GFR, Estimated: 16 mL/min — ABNORMAL LOW (ref >60)
Glucose, Bld: 125 mg/dL — ABNORMAL HIGH (ref 70–99)
Potassium: 4.6 mmol/L (ref 3.5–5.1)
Sodium: 139 mmol/L (ref 135–145)

## 2021-11-21 LAB — CBC
HCT: 46 % (ref 39.0–52.0)
Hemoglobin: 14.8 g/dL (ref 13.0–17.0)
MCH: 27.5 pg (ref 26.0–34.0)
MCHC: 32.2 g/dL (ref 30.0–36.0)
MCV: 85.3 fL (ref 80.0–100.0)
Platelets: 198 10*3/uL (ref 150–400)
RBC: 5.39 MIL/uL (ref 4.22–5.81)
RDW: 16.5 % — ABNORMAL HIGH (ref 11.5–15.5)
WBC: 17.3 10*3/uL — ABNORMAL HIGH (ref 4.0–10.5)
nRBC: 0 % (ref 0.0–0.2)

## 2021-11-21 LAB — LACTIC ACID, PLASMA: Lactic Acid, Venous: 1.9 mmol/L (ref 0.5–1.9)

## 2021-11-21 IMAGING — CR DG CHEST 2V
2 series · 2 of 2 positions shown · non-contrast
Comparison: [DATE]

CLINICAL DATA: Leukocytosis

EXAM:
CHEST - 2 VIEW

[chest lat]
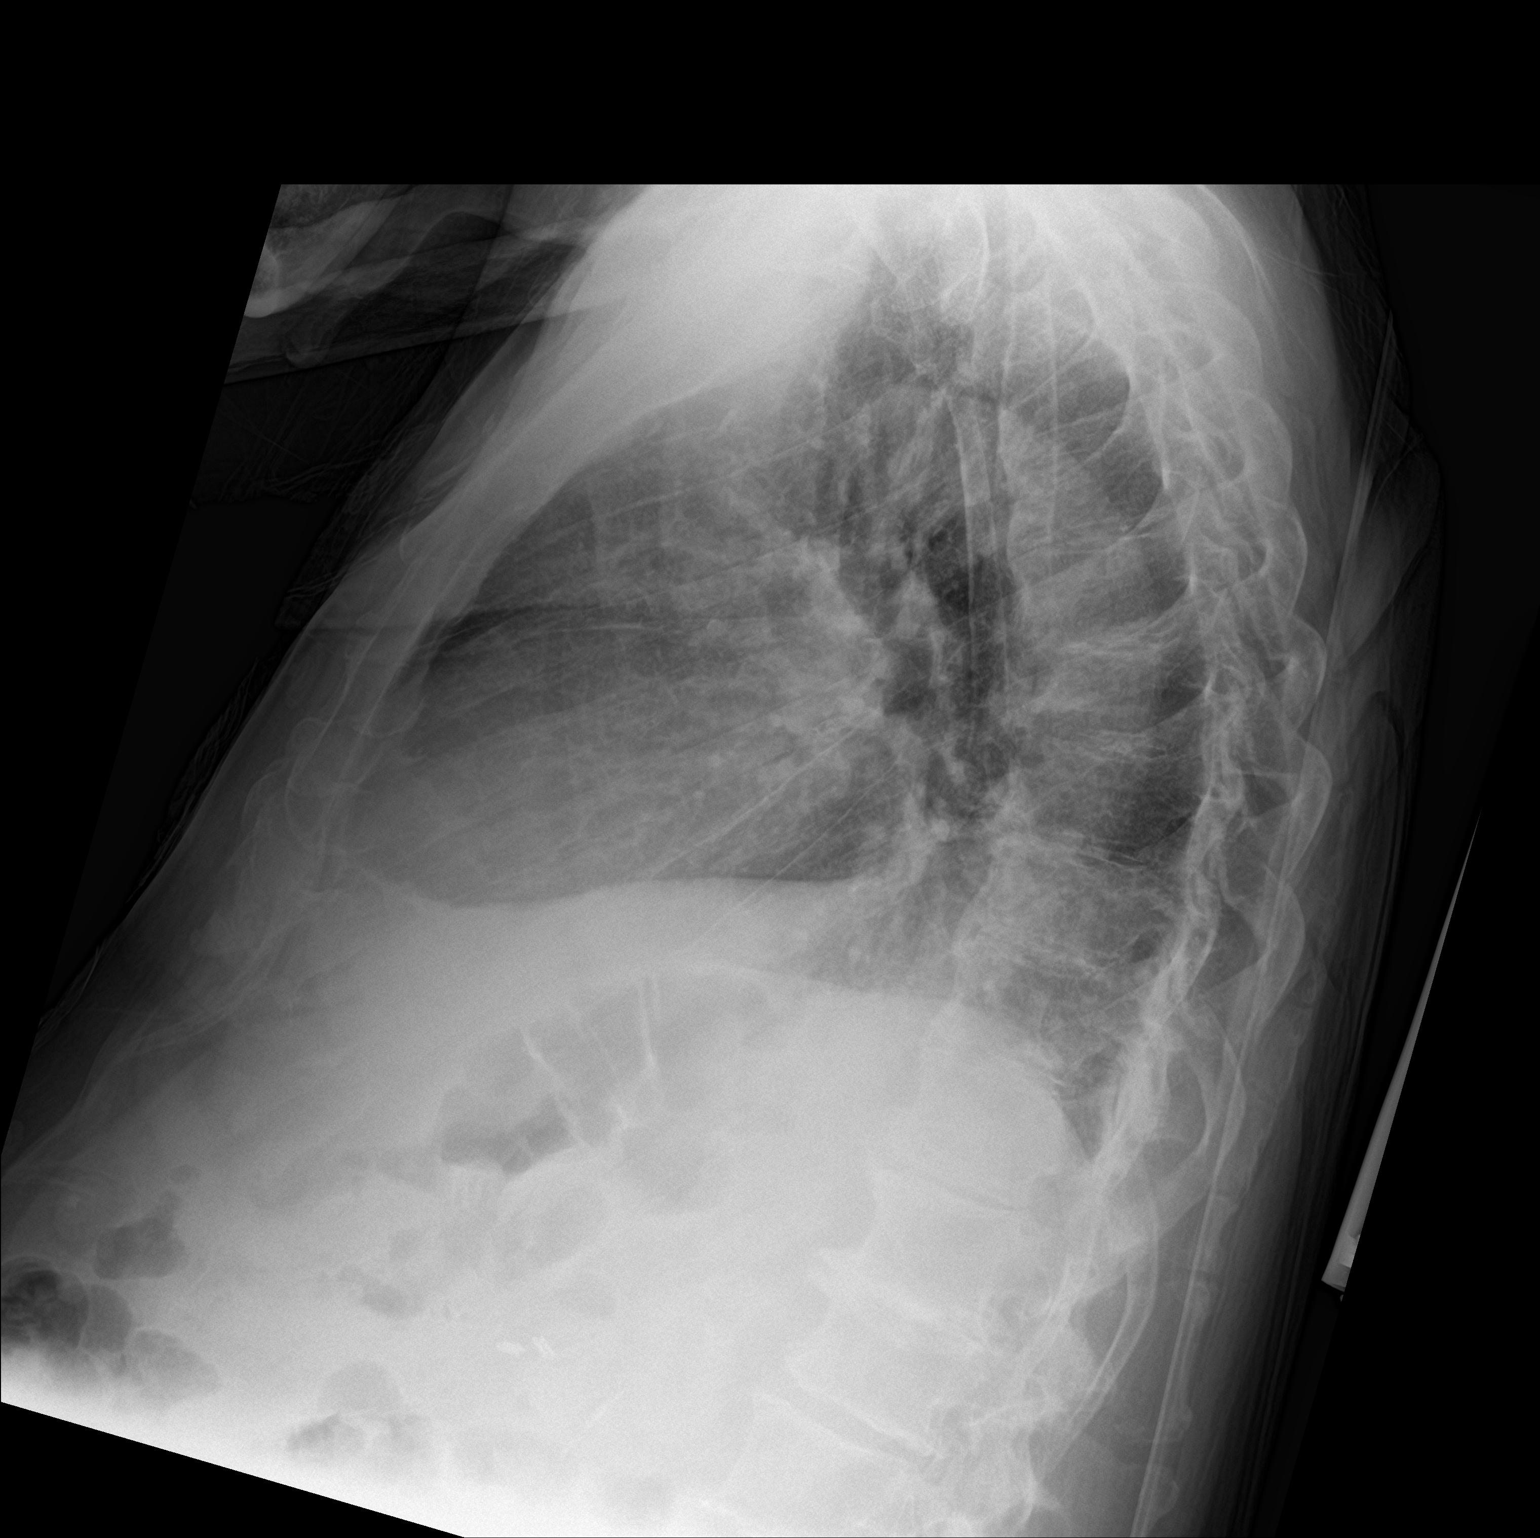

[chest ap]
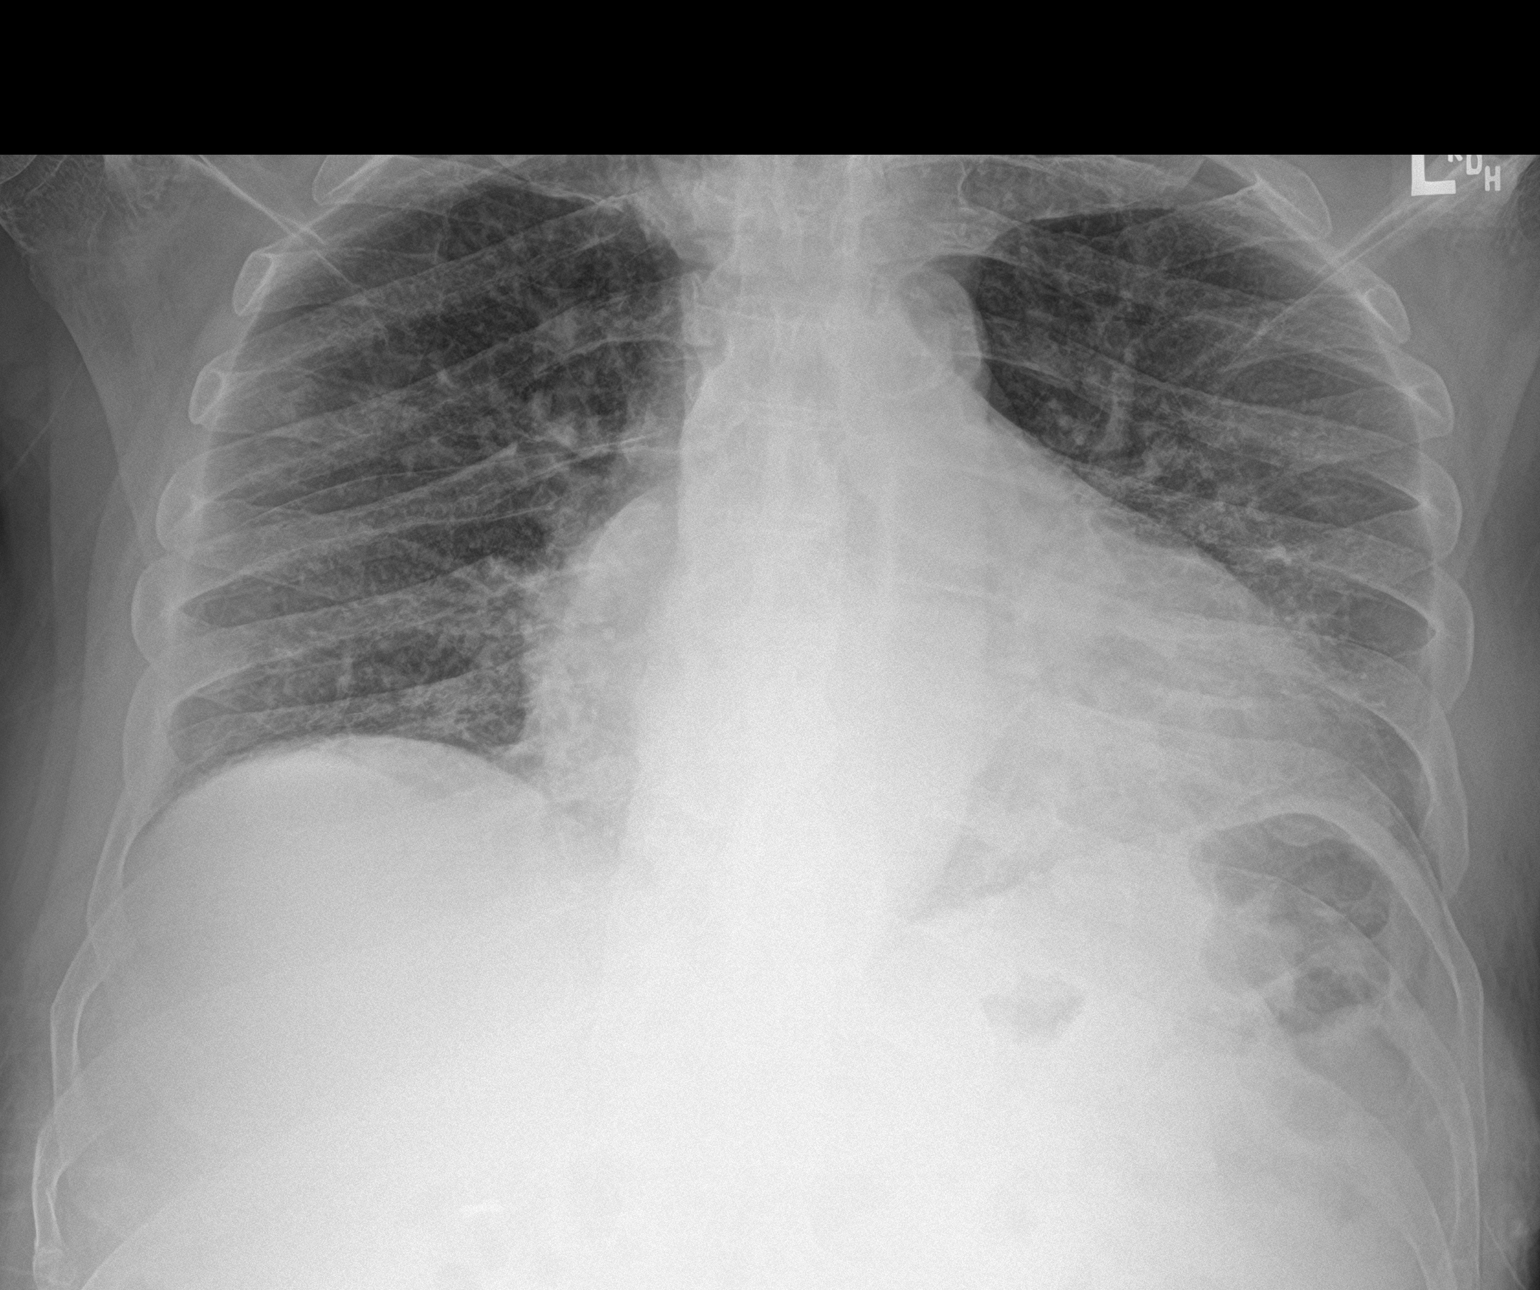

[2 of 2 positions shown; findings below may reference images not displayed]

FINDINGS: Transverse diameter of heart is increased. There is poor
inspiration. There are no signs of alveolar pulmonary edema or focal
pulmonary consolidation. There is no pleural effusion or
pneumothorax.
IMPRESSION: Cardiomegaly. Poor inspiration. There are no signs of pulmonary
edema or focal pulmonary consolidation.

## 2021-11-21 NOTE — ED Provider Triage Note (Signed)
Emergency Medicine Provider Triage Evaluation Note  George Bolton , a 81 y.o. male  was evaluated in triage.  Patient denies any complaints or pain but son states he has had decreased appetite for the last couple of days.  Patient had abnormal blood work at PCPs office showing elevated creatinine, elevated white count.  These labs were not able to be visualized.  Patient without fevers, abdominal pain.  No headaches.  Son states patient's mental status is at baseline that he has not been feeling well and not eating well over the last few days.  Review of Systems  Positive: Fatigue, decreased appetite, abnormal lab work Negative: Chest pain, shortness of breath, abdominal pain, fevers  Physical Exam  BP 103/69    Pulse (!) 114    Temp 98 F (36.7 C) (Oral)    Resp (!) 22    Ht 6' (1.829 m)    Wt 79.4 kg    SpO2 97%    BMI 23.73 kg/m  Gen:   Awake, no distress presents in a wheelchair Resp:  Normal effort  MSK:   Moves extremities without difficulty   Medical Decision Making  Medically screening exam initiated at 6:05 PM.  Appropriate orders placed.  JACKEY HOUSEY was informed that the remainder of the evaluation will be completed by another provider, this initial triage assessment does not replace that evaluation, and the importance of remaining in the ED until their evaluation is complete.    Duanne Guess, PA-C 11/21/21 1807

## 2021-11-21 NOTE — ED Triage Notes (Signed)
Pt to ED POV for abnormal labs and overall not feeling well. Son reports he thinks pt had elevated white count, elevated creatinine and something "abnormal with heart" Reports pt has not been eating for past few days. Has had chills.  Neg covid test yesterday  Hx afib

## 2021-11-22 ENCOUNTER — Inpatient Hospital Stay: Payer: Medicare Other

## 2021-11-22 ENCOUNTER — Inpatient Hospital Stay
Admit: 2021-11-22 | Discharge: 2021-11-22 | Disposition: A | Discharge status: Home / Self Care | Payer: Medicare Other | Attending: Family Medicine | Admitting: Family Medicine

## 2021-11-22 ENCOUNTER — Inpatient Hospital Stay
Admission: EM | Admit: 2021-11-22 | Discharge: 2021-11-28 | DRG: 682 | Disposition: A | Discharge status: Skilled Nursing Facility | Payer: Medicare Other | Attending: Internal Medicine | Admitting: Internal Medicine

## 2021-11-22 DIAGNOSIS — R778 Other specified abnormalities of plasma proteins: Secondary | ICD-10-CM

## 2021-11-22 DIAGNOSIS — Z888 Allergy status to other drugs, medicaments and biological substances status: Secondary | ICD-10-CM | POA: Diagnosis not present

## 2021-11-22 DIAGNOSIS — Z79899 Other long term (current) drug therapy: Secondary | ICD-10-CM | POA: Diagnosis not present

## 2021-11-22 DIAGNOSIS — M10041 Idiopathic gout, right hand: Secondary | ICD-10-CM | POA: Diagnosis not present

## 2021-11-22 DIAGNOSIS — Z7189 Other specified counseling: Secondary | ICD-10-CM | POA: Diagnosis not present

## 2021-11-22 DIAGNOSIS — N189 Chronic kidney disease, unspecified: Secondary | ICD-10-CM

## 2021-11-22 DIAGNOSIS — E872 Acidosis, unspecified: Secondary | ICD-10-CM

## 2021-11-22 DIAGNOSIS — E785 Hyperlipidemia, unspecified: Secondary | ICD-10-CM | POA: Diagnosis present

## 2021-11-22 DIAGNOSIS — I639 Cerebral infarction, unspecified: Secondary | ICD-10-CM

## 2021-11-22 DIAGNOSIS — I5032 Chronic diastolic (congestive) heart failure: Secondary | ICD-10-CM

## 2021-11-22 DIAGNOSIS — M109 Gout, unspecified: Secondary | ICD-10-CM | POA: Diagnosis present

## 2021-11-22 DIAGNOSIS — Z20822 Contact with and (suspected) exposure to covid-19: Secondary | ICD-10-CM | POA: Diagnosis present

## 2021-11-22 DIAGNOSIS — Z23 Encounter for immunization: Secondary | ICD-10-CM | POA: Diagnosis present

## 2021-11-22 DIAGNOSIS — I4891 Unspecified atrial fibrillation: Secondary | ICD-10-CM | POA: Diagnosis present

## 2021-11-22 DIAGNOSIS — R52 Pain, unspecified: Secondary | ICD-10-CM

## 2021-11-22 DIAGNOSIS — E860 Dehydration: Secondary | ICD-10-CM

## 2021-11-22 DIAGNOSIS — Z66 Do not resuscitate: Secondary | ICD-10-CM | POA: Diagnosis present

## 2021-11-22 DIAGNOSIS — Z7982 Long term (current) use of aspirin: Secondary | ICD-10-CM | POA: Diagnosis not present

## 2021-11-22 DIAGNOSIS — I6340 Cerebral infarction due to embolism of unspecified cerebral artery: Secondary | ICD-10-CM | POA: Diagnosis not present

## 2021-11-22 DIAGNOSIS — J189 Pneumonia, unspecified organism: Secondary | ICD-10-CM

## 2021-11-22 DIAGNOSIS — R531 Weakness: Secondary | ICD-10-CM | POA: Diagnosis not present

## 2021-11-22 DIAGNOSIS — E1122 Type 2 diabetes mellitus with diabetic chronic kidney disease: Secondary | ICD-10-CM | POA: Diagnosis present

## 2021-11-22 DIAGNOSIS — Z7901 Long term (current) use of anticoagulants: Secondary | ICD-10-CM | POA: Diagnosis not present

## 2021-11-22 DIAGNOSIS — N1831 Chronic kidney disease, stage 3a: Secondary | ICD-10-CM | POA: Diagnosis present

## 2021-11-22 DIAGNOSIS — I5033 Acute on chronic diastolic (congestive) heart failure: Secondary | ICD-10-CM | POA: Diagnosis present

## 2021-11-22 DIAGNOSIS — I130 Hypertensive heart and chronic kidney disease with heart failure and stage 1 through stage 4 chronic kidney disease, or unspecified chronic kidney disease: Secondary | ICD-10-CM | POA: Diagnosis present

## 2021-11-22 DIAGNOSIS — N5089 Other specified disorders of the male genital organs: Secondary | ICD-10-CM

## 2021-11-22 DIAGNOSIS — D72829 Elevated white blood cell count, unspecified: Secondary | ICD-10-CM

## 2021-11-22 DIAGNOSIS — R627 Adult failure to thrive: Secondary | ICD-10-CM | POA: Diagnosis not present

## 2021-11-22 DIAGNOSIS — W19XXXA Unspecified fall, initial encounter: Secondary | ICD-10-CM | POA: Diagnosis present

## 2021-11-22 DIAGNOSIS — I482 Chronic atrial fibrillation, unspecified: Secondary | ICD-10-CM | POA: Diagnosis present

## 2021-11-22 DIAGNOSIS — Z515 Encounter for palliative care: Secondary | ICD-10-CM | POA: Diagnosis not present

## 2021-11-22 DIAGNOSIS — N179 Acute kidney failure, unspecified: Secondary | ICD-10-CM | POA: Diagnosis present

## 2021-11-22 DIAGNOSIS — Z9049 Acquired absence of other specified parts of digestive tract: Secondary | ICD-10-CM | POA: Diagnosis not present

## 2021-11-22 DIAGNOSIS — Z823 Family history of stroke: Secondary | ICD-10-CM | POA: Diagnosis not present

## 2021-11-22 DIAGNOSIS — I4821 Permanent atrial fibrillation: Secondary | ICD-10-CM | POA: Diagnosis not present

## 2021-11-22 DIAGNOSIS — G8194 Hemiplegia, unspecified affecting left nondominant side: Secondary | ICD-10-CM | POA: Diagnosis not present

## 2021-11-22 DIAGNOSIS — E669 Obesity, unspecified: Secondary | ICD-10-CM | POA: Diagnosis present

## 2021-11-22 DIAGNOSIS — M10061 Idiopathic gout, right knee: Secondary | ICD-10-CM | POA: Diagnosis not present

## 2021-11-22 LAB — BASIC METABOLIC PANEL
Anion gap: 8 (ref 5–15)
BUN: 58 mg/dL — ABNORMAL HIGH (ref 8–23)
CO2: 19 mmol/L — ABNORMAL LOW (ref 22–32)
Calcium: 8.9 mg/dL (ref 8.9–10.3)
Chloride: 109 mmol/L (ref 98–111)
Creatinine, Ser: 3.63 mg/dL — ABNORMAL HIGH (ref 0.61–1.24)
GFR, Estimated: 16 mL/min — ABNORMAL LOW (ref >60)
Glucose, Bld: 148 mg/dL — ABNORMAL HIGH (ref 70–99)
Potassium: 4.6 mmol/L (ref 3.5–5.1)
Sodium: 136 mmol/L (ref 135–145)

## 2021-11-22 LAB — CBC
HCT: 43.2 % (ref 39.0–52.0)
Hemoglobin: 14.1 g/dL (ref 13.0–17.0)
MCH: 27.1 pg (ref 26.0–34.0)
MCHC: 32.6 g/dL (ref 30.0–36.0)
MCV: 83.1 fL (ref 80.0–100.0)
Platelets: 170 10*3/uL (ref 150–400)
RBC: 5.2 MIL/uL (ref 4.22–5.81)
RDW: 16.4 % — ABNORMAL HIGH (ref 11.5–15.5)
WBC: 12.1 10*3/uL — ABNORMAL HIGH (ref 4.0–10.5)
nRBC: 0 % (ref 0.0–0.2)

## 2021-11-22 LAB — URINALYSIS, COMPLETE (UACMP) WITH MICROSCOPIC
Bilirubin Urine: NEGATIVE
Glucose, UA: NEGATIVE mg/dL
Ketones, ur: NEGATIVE mg/dL
Nitrite: NEGATIVE
Protein, ur: >300 mg/dL — AB
Specific Gravity, Urine: 1.02 (ref 1.005–1.030)
pH: 5.5 (ref 5.0–8.0)

## 2021-11-22 LAB — RESP PANEL BY RT-PCR (FLU A&B, COVID) ARPGX2
Influenza A by PCR: NEGATIVE
Influenza B by PCR: NEGATIVE
SARS Coronavirus 2 by RT PCR: NEGATIVE

## 2021-11-22 LAB — ECHOCARDIOGRAM COMPLETE
AR max vel: 3.04 cm2
AV Peak grad: 3.6 mmHg
Ao pk vel: 0.95 m/s
Area-P 1/2: 5.75 cm2
S' Lateral: 2.5 cm

## 2021-11-22 LAB — GLUCOSE, CAPILLARY
Glucose-Capillary: 110 mg/dL — ABNORMAL HIGH (ref 70–99)
Glucose-Capillary: 164 mg/dL — ABNORMAL HIGH (ref 70–99)
Glucose-Capillary: 107 mg/dL — ABNORMAL HIGH (ref 70–99)
Glucose-Capillary: 144 mg/dL — ABNORMAL HIGH (ref 70–99)

## 2021-11-22 LAB — TROPONIN I (HIGH SENSITIVITY)
Troponin I (High Sensitivity): 82 ng/L — ABNORMAL HIGH (ref <18)
Troponin I (High Sensitivity): 56 ng/L — ABNORMAL HIGH (ref <18)

## 2021-11-22 LAB — PROCALCITONIN
Procalcitonin: 5.94 ng/mL
Procalcitonin: 5.4 ng/mL

## 2021-11-22 IMAGING — US US RENAL
1 series · 14 of 25 positions shown · non-contrast
Comparison: [DATE]

CLINICAL DATA: Renal dysfunction

EXAM:
RENAL / URINARY TRACT ULTRASOUND COMPLETE

[Series 1: us renal · 14 of 91 slices shown]
[im 1/91]
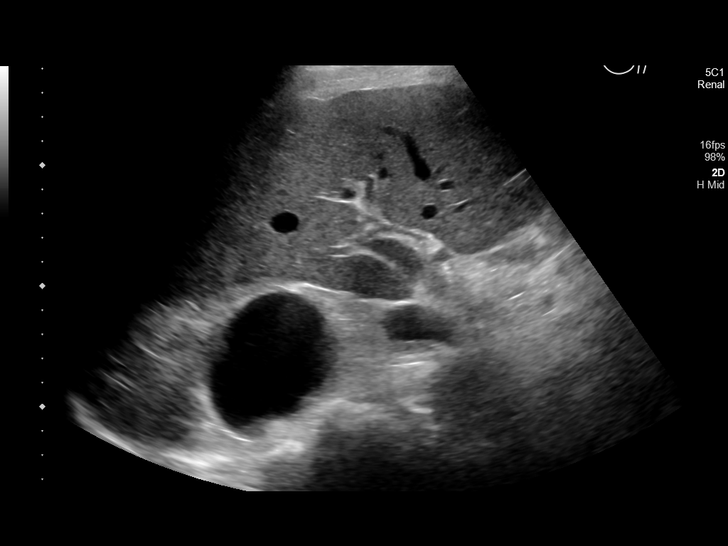
[im 8/91]
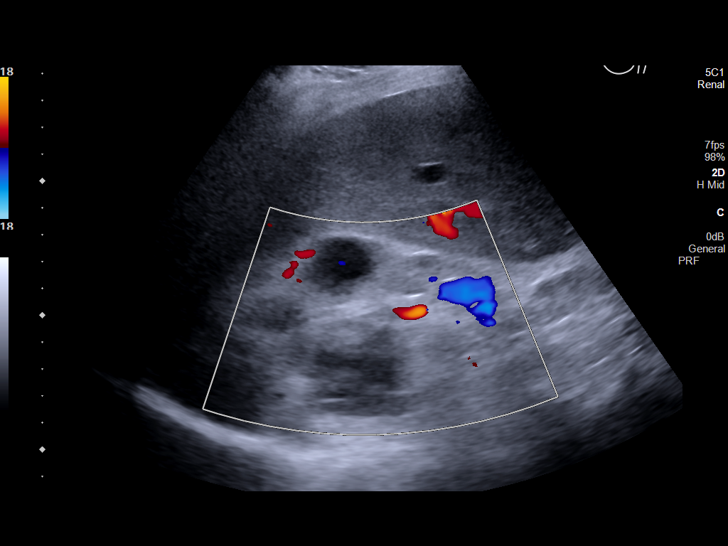
[im 16/91]
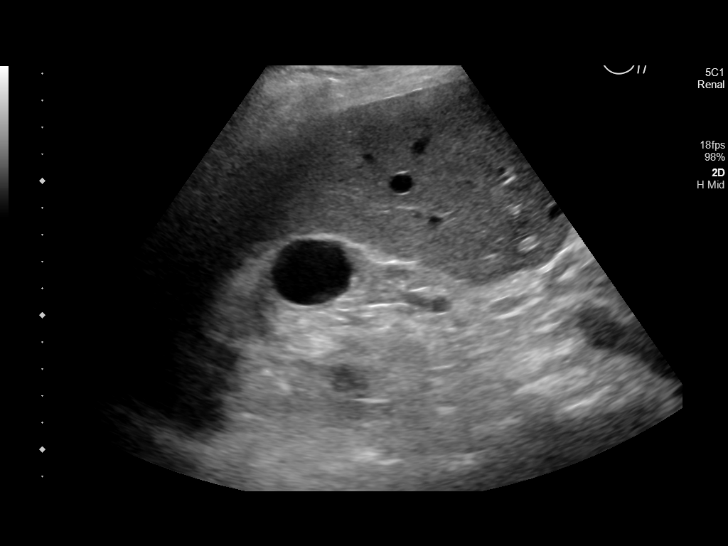
[im 23/91]
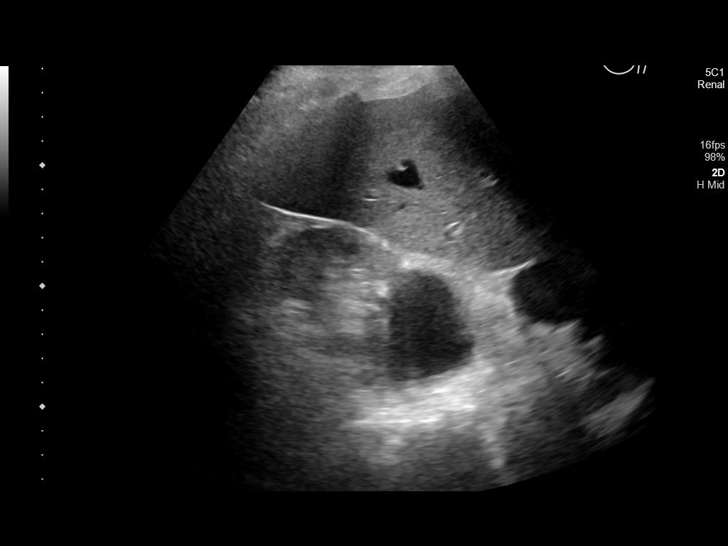
[im 31/91]
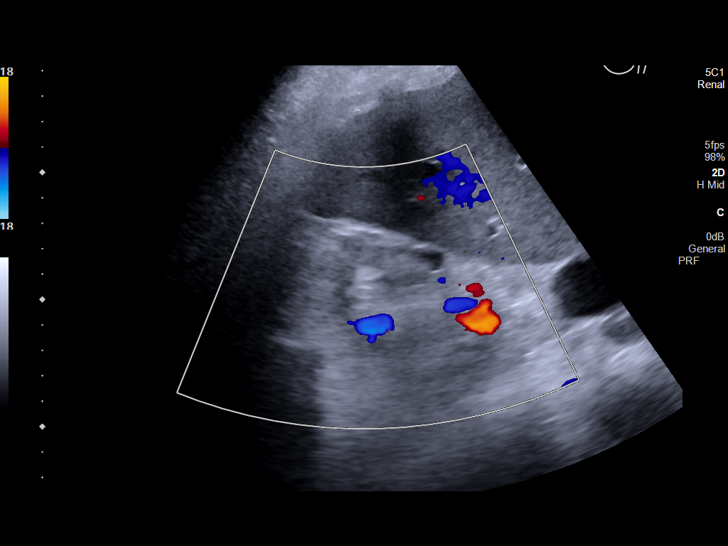
[im 34/91]
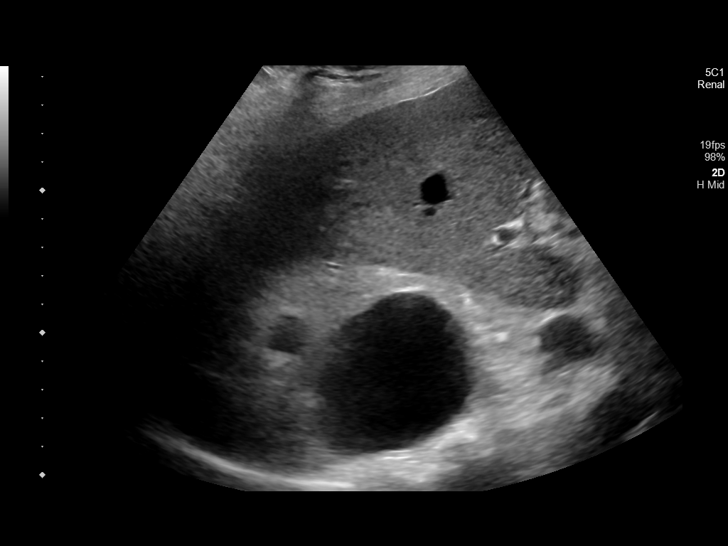
[im 42/91]
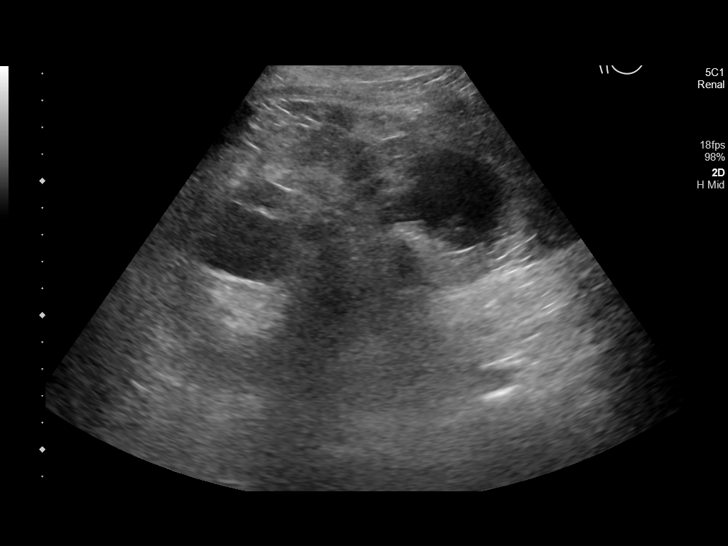
[im 49/91]
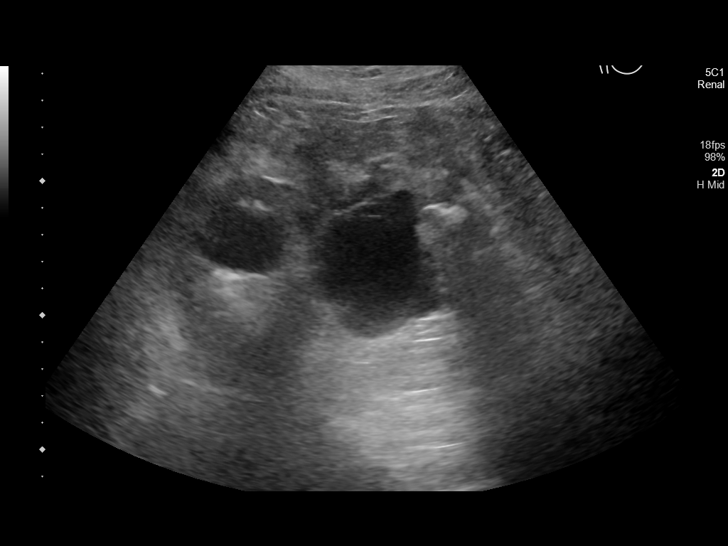
[im 57/91]
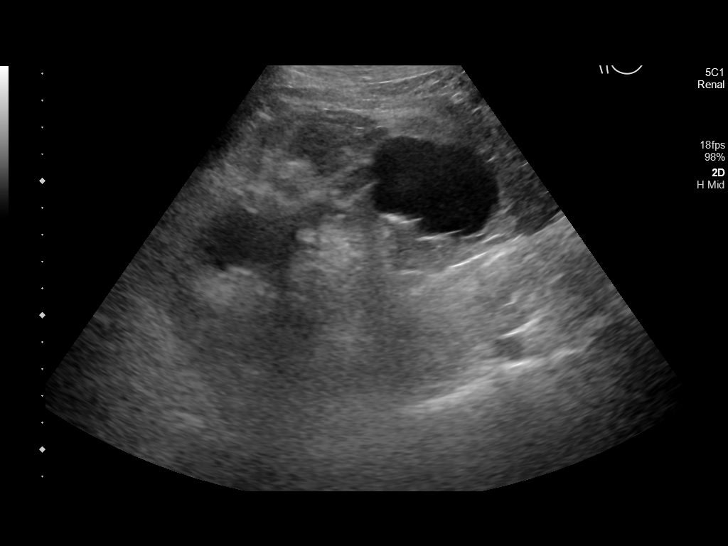
[im 61/91]
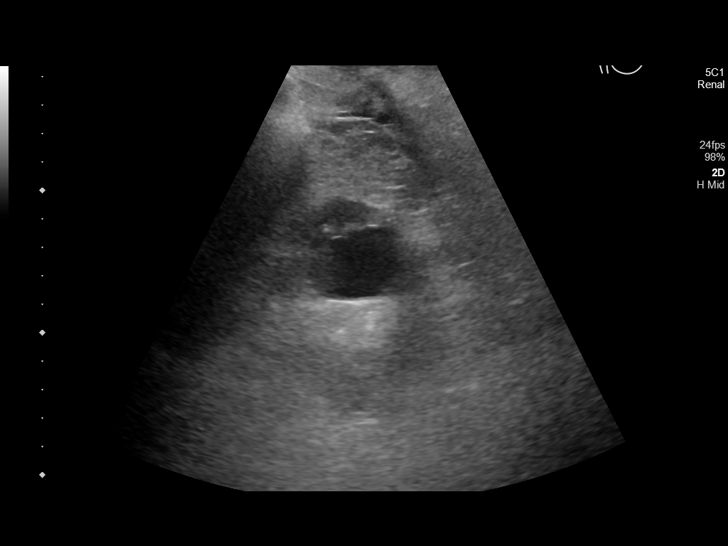
[im 68/91]
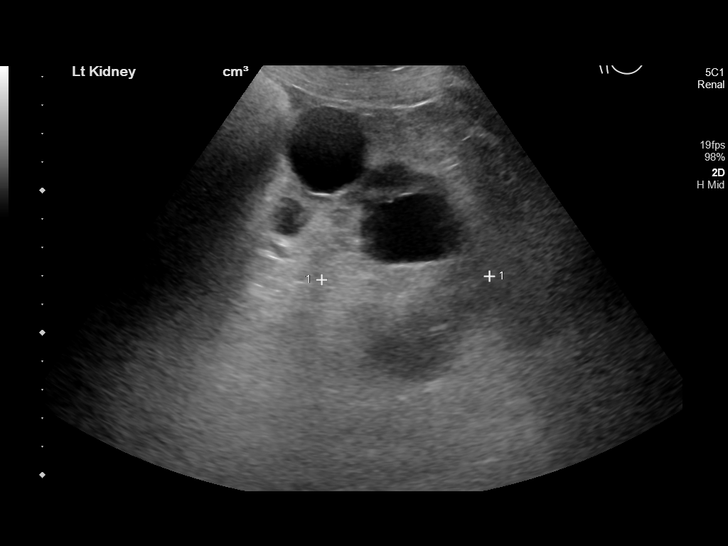
[im 76/91]
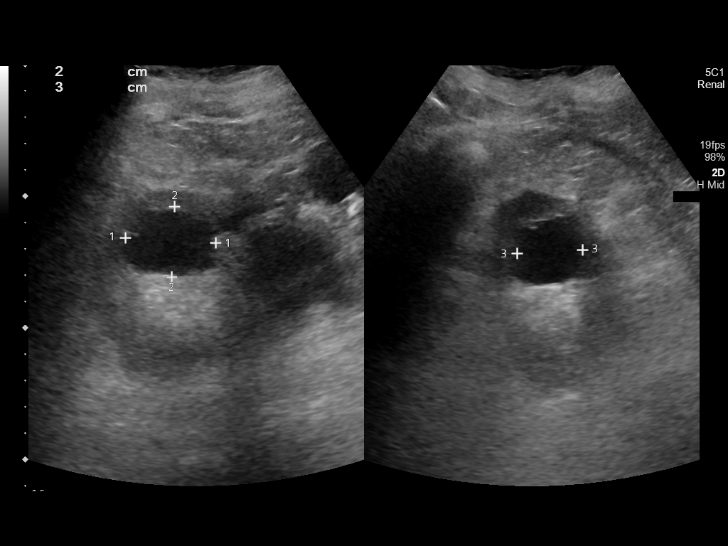
[im 83/91]
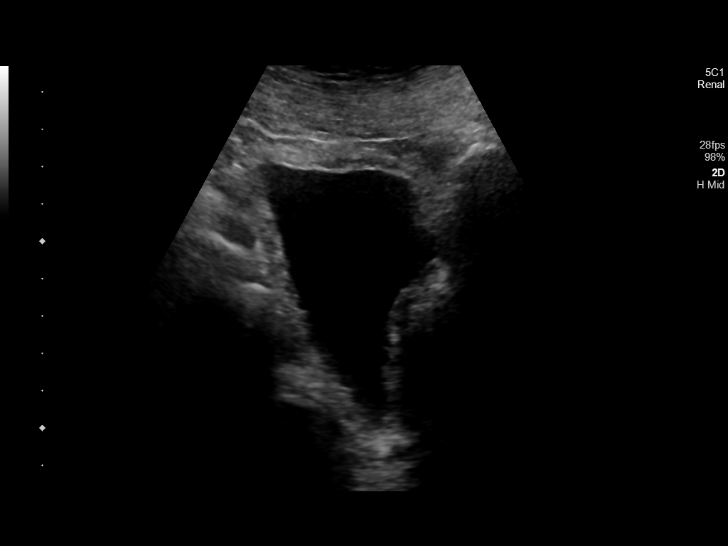
[im 91/91]
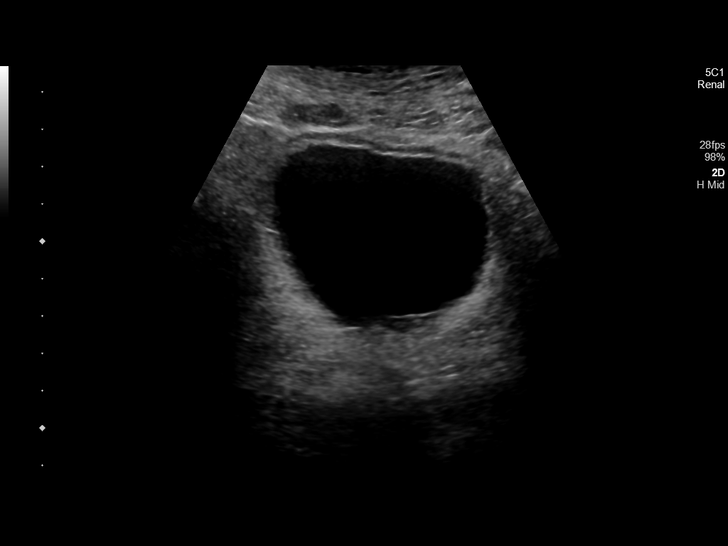

[14 of 25 positions shown; findings below may reference images not displayed]

FINDINGS: Right Kidney:

Renal measurements: 9.4 x 5.3 x 4.8 cm = volume: 272.1 mL. There is
increased cortical echogenicity. There is no hydronephrosis. There
are multiple cysts in the right kidney largest measuring 5.4 x 5 x
5.6 cm in the upper pole.

Left Kidney:

Renal measurements: 13.6 x 7.6 x 5.9 cm = volume: 317.9 mL. There is
no hydronephrosis. There is increased cortical echogenicity. There
are multiple cysts of varying sizes largest measuring 5.7 x 4.9 x
5.1 cm in the midportion. Overall, no significant interval changes
are noted.

Bladder:

Appears normal for degree of bladder distention.

Other:

None.
IMPRESSION: There is no hydronephrosis. There is increased cortical echogenicity
in both kidneys suggesting medical renal disease. Bilateral renal
cysts.

## 2021-11-22 IMAGING — CR DG CHEST 2V
1 series · 2 of 2 positions shown · non-contrast
Comparison: [DATE].

CLINICAL DATA: Weakness.

EXAM:
CHEST - 2 VIEW

[Series 1: dg chest 2 view · 0.14mm/px · 2 of 2 slices shown]
[im 1/2]
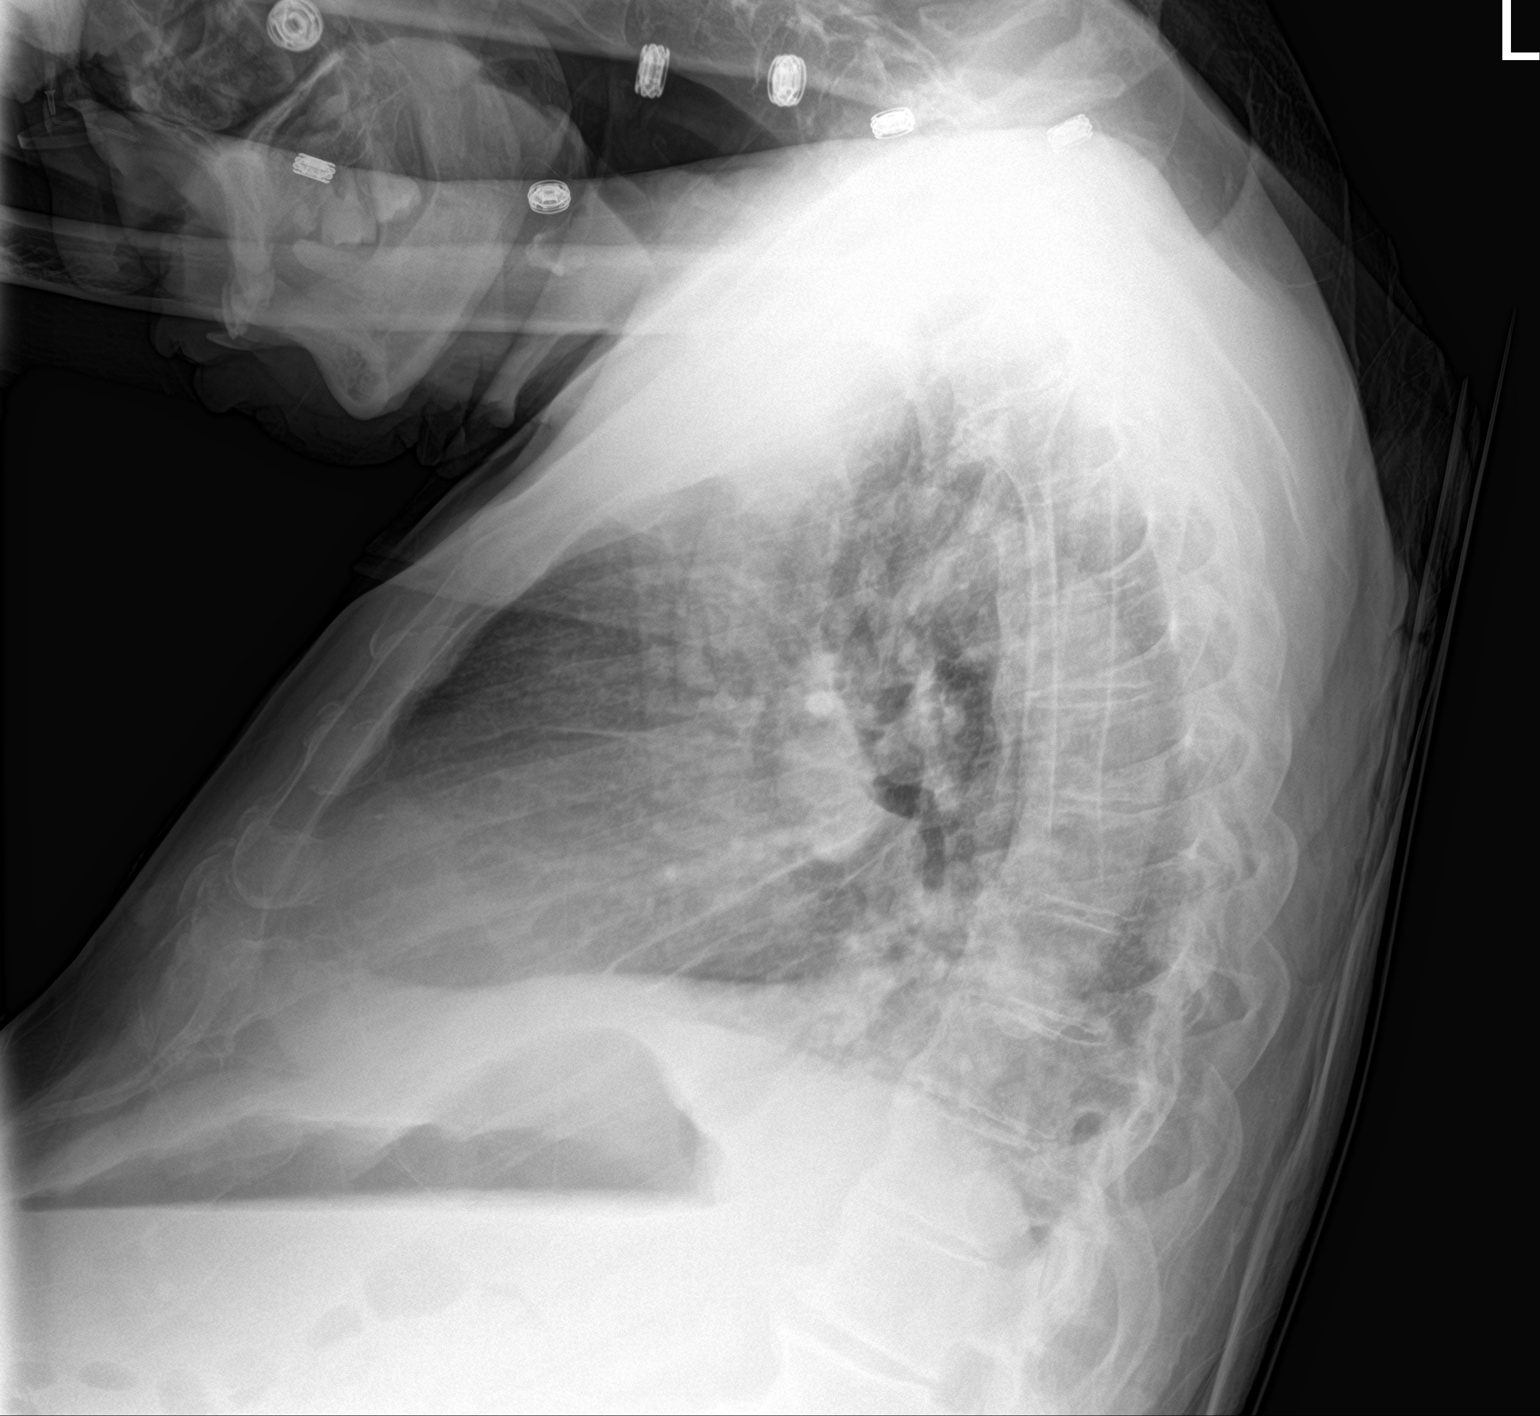
[im 2/2]
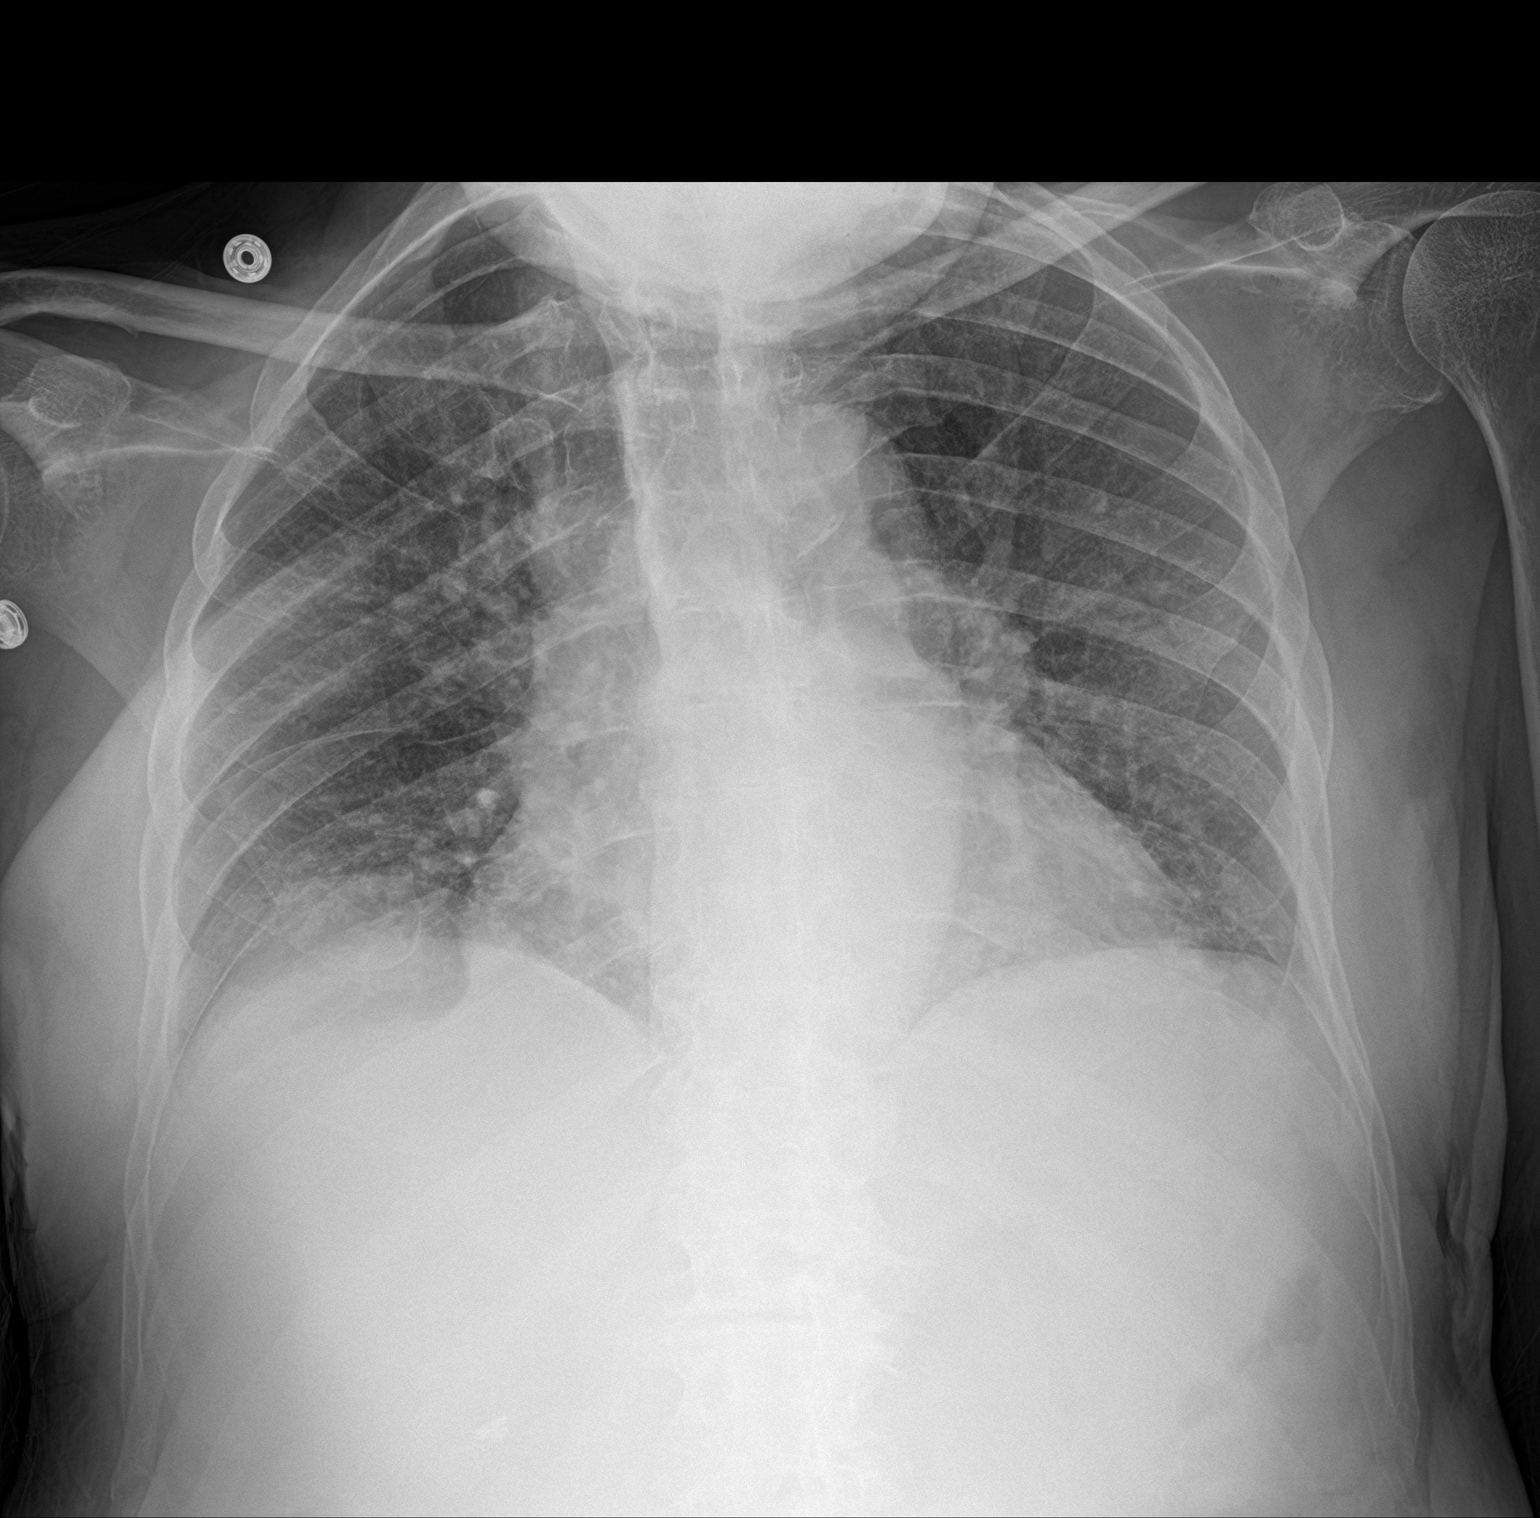

[2 of 2 positions shown; findings below may reference images not displayed]

FINDINGS: Stable cardiomediastinal silhouette. Mildly increased bibasilar
atelectasis or edema is noted. Bony thorax is unremarkable.
IMPRESSION: Mildly increased bibasilar atelectasis or edema.

## 2021-11-22 MED ORDER — ONDANSETRON HCL 4 MG/2ML IJ SOLN: 4.0000 mg | Freq: Four times a day (QID) | INTRAMUSCULAR | Status: DC | PRN

## 2021-11-22 MED ORDER — CLONIDINE HCL 0.1 MG PO TABS
0.1000 mg | ORAL_TABLET | Freq: Two times a day (BID) | ORAL | Status: DC
  Administered 2021-11-23 – 2021-11-28 (×11): 0.1 mg via ORAL
  Filled 2021-11-22 (×12): qty 1

## 2021-11-22 MED ORDER — ACETAMINOPHEN 325 MG PO TABS
650.0000 mg | ORAL_TABLET | Freq: Four times a day (QID) | ORAL | Status: DC | PRN
  Administered 2021-11-26 – 2021-11-27 (×4): 650 mg via ORAL
  Filled 2021-11-22 (×5): qty 2

## 2021-11-22 MED ORDER — APIXABAN 2.5 MG PO TABS
2.5000 mg | ORAL_TABLET | Freq: Two times a day (BID) | ORAL | Status: DC
  Administered 2021-11-22 – 2021-11-25 (×7): 2.5 mg via ORAL
  Filled 2021-11-22 (×7): qty 1

## 2021-11-22 MED ORDER — AZITHROMYCIN 500 MG PO TABS
500.0000 mg | ORAL_TABLET | Freq: Every day | ORAL | Status: DC
  Administered 2021-11-22 – 2021-11-27 (×6): 500 mg via ORAL
  Filled 2021-11-22 (×6): qty 1

## 2021-11-22 MED ORDER — ATORVASTATIN CALCIUM 20 MG PO TABS
40.0000 mg | ORAL_TABLET | Freq: Every evening | ORAL | Status: DC
  Administered 2021-11-22 – 2021-11-27 (×6): 40 mg via ORAL
  Filled 2021-11-22 (×6): qty 2

## 2021-11-22 MED ORDER — FAMOTIDINE 20 MG PO TABS
20.0000 mg | ORAL_TABLET | Freq: Two times a day (BID) | ORAL | Status: DC
  Administered 2021-11-22 – 2021-11-23 (×3): 20 mg via ORAL
  Filled 2021-11-22 (×3): qty 1

## 2021-11-22 MED ORDER — METOPROLOL TARTRATE 50 MG PO TABS
50.0000 mg | ORAL_TABLET | Freq: Two times a day (BID) | ORAL | Status: DC
  Administered 2021-11-22 – 2021-11-28 (×13): 50 mg via ORAL
  Filled 2021-11-22 (×13): qty 1

## 2021-11-22 MED ORDER — ASPIRIN 325 MG PO TABS
325.0000 mg | ORAL_TABLET | Freq: Every day | ORAL | Status: DC
  Administered 2021-11-22 – 2021-11-23 (×2): 325 mg via ORAL
  Filled 2021-11-22 (×2): qty 1

## 2021-11-22 MED ORDER — TRAZODONE HCL 50 MG PO TABS
25.0000 mg | ORAL_TABLET | Freq: Every evening | ORAL | Status: DC | PRN
  Administered 2021-11-24 – 2021-11-25 (×2): 25 mg via ORAL
  Filled 2021-11-22 (×2): qty 1

## 2021-11-22 MED ORDER — ALLOPURINOL 100 MG PO TABS
50.0000 mg | ORAL_TABLET | Freq: Every day | ORAL | Status: DC
  Administered 2021-11-22 – 2021-11-28 (×7): 50 mg via ORAL
  Filled 2021-11-22 (×7): qty 1

## 2021-11-22 MED ORDER — INFLUENZA VAC A&B SA ADJ QUAD 0.5 ML IM PRSY
0.5000 mL | PREFILLED_SYRINGE | INTRAMUSCULAR | Status: AC
  Administered 2021-11-24: 0.5 mL via INTRAMUSCULAR
  Filled 2021-11-22: qty 0.5

## 2021-11-22 MED ORDER — ENOXAPARIN SODIUM 30 MG/0.3ML IJ SOSY
30.0000 mg | PREFILLED_SYRINGE | INTRAMUSCULAR | Status: DC
  Filled 2021-11-22: qty 0.3

## 2021-11-22 MED ORDER — SODIUM CHLORIDE 0.9 % IV SOLN: INTRAVENOUS | Status: DC

## 2021-11-22 MED ORDER — HYDRALAZINE HCL 50 MG PO TABS
25.0000 mg | ORAL_TABLET | Freq: Three times a day (TID) | ORAL | Status: DC
  Administered 2021-11-22 – 2021-11-25 (×8): 25 mg via ORAL
  Filled 2021-11-22 (×9): qty 1

## 2021-11-22 MED ORDER — INSULIN ASPART 100 UNIT/ML IJ SOLN
0.0000 [IU] | Freq: Three times a day (TID) | INTRAMUSCULAR | Status: DC
  Administered 2021-11-22: 1 [IU] via SUBCUTANEOUS
  Administered 2021-11-22: 2 [IU] via SUBCUTANEOUS
  Administered 2021-11-23 – 2021-11-24 (×2): 1 [IU] via SUBCUTANEOUS
  Administered 2021-11-24: 2 [IU] via SUBCUTANEOUS
  Administered 2021-11-25 – 2021-11-26 (×4): 1 [IU] via SUBCUTANEOUS
  Administered 2021-11-26: 3 [IU] via SUBCUTANEOUS
  Administered 2021-11-27: 1 [IU] via SUBCUTANEOUS
  Administered 2021-11-27: 22:00:00 2 [IU] via SUBCUTANEOUS
  Administered 2021-11-27: 12:00:00 3 [IU] via SUBCUTANEOUS
  Administered 2021-11-28: 12:00:00 2 [IU] via SUBCUTANEOUS
  Filled 2021-11-22 (×13): qty 1

## 2021-11-22 MED ORDER — SODIUM CHLORIDE 0.9 % IV BOLUS
500.0000 mL | Freq: Once | INTRAVENOUS | Status: AC
  Administered 2021-11-22: 500 mL via INTRAVENOUS

## 2021-11-22 MED ORDER — ISOSORBIDE MONONITRATE ER 30 MG PO TB24
30.0000 mg | ORAL_TABLET | Freq: Every day | ORAL | Status: DC
Start: 2021-11-22 — End: 2021-11-28
  Administered 2021-11-22 – 2021-11-28 (×7): 30 mg via ORAL
  Filled 2021-11-22 (×7): qty 1

## 2021-11-22 MED ORDER — ACETAMINOPHEN 650 MG RE SUPP: 650.0000 mg | Freq: Four times a day (QID) | RECTAL | Status: DC | PRN

## 2021-11-22 MED ORDER — LACTATED RINGERS IV SOLN: INTRAVENOUS | Status: DC

## 2021-11-22 MED ORDER — MAGNESIUM HYDROXIDE 400 MG/5ML PO SUSP
30.0000 mL | Freq: Every day | ORAL | Status: DC | PRN
  Administered 2021-11-28: 11:00:00 30 mL via ORAL
  Filled 2021-11-22 (×2): qty 30

## 2021-11-22 MED ORDER — ONDANSETRON HCL 4 MG PO TABS: 4.0000 mg | ORAL_TABLET | Freq: Four times a day (QID) | ORAL | Status: DC | PRN

## 2021-11-22 MED ORDER — SODIUM CHLORIDE 0.9 % IV SOLN
1.0000 g | INTRAVENOUS | Status: DC
  Administered 2021-11-22 – 2021-11-27 (×6): 1 g via INTRAVENOUS
  Filled 2021-11-22 (×6): qty 1

## 2021-11-22 NOTE — Progress Notes (Addendum)
PROGRESS NOTE    George Bolton  XFG:[id number] DOB: [birth year] DOA: 11/22/2021 PCP: Denton Lank, MD    Brief Narrative:  George Bolton is a 81 y.o. African-American male with medical history significant for chronic atrial fibrillation, chronic diastolic CHF, gout and hypertension, presented to the ER with acute onset of generalized weakness with diminished p.o. intake and abnormal labs that were checked by PCP with elevated creatinine.  Creatinine was 3.  6 3 with baseline 1.33.  Procalcitonin 5.94, chest x-ray did not show any acute changes.   Assessment & Plan:   Principal Problem:   Acute kidney injury superimposed on chronic kidney disease ([place])  Acute kidney injury superimposed on chronic kidney disease stage IIIa. Metabolic acidosis. Elevated troponin secondary to acute renal failure. Appear to be multifactorial including dehydration, urinary retention and elevated uric acid. Patient has a postvoid residual of 360, will place a Foley catheter. Patient also has intrinsic renal diseases, significant proteinuria on UA.  Obtain renal ultrasound. Patient also has gout with significant bilateral hands gouty arthritis.  We will check uric acid level. Patient has elevated troponin peak at 88, no evidence of chest pain or shortness of breath.  Most likely due to acute renal failure. Fluids changed to lactated Ringer for metabolic acidosis.  Leukocytosis. Patient also had a profound elevation of procalcitonin level, repeated procalcitonin level still elevated.  Cannot explain by renal failure.  Reviewed patient chest x-ray, no evidence of pneumonia. Will recheck chest x-ray after rehydration, pneumonia most likely will show up after fluids.  For now, I will cover with Rocephin and Zithromax for possible pneumonia.  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 Does not seem to have any acute exacerbation, continue allopurinol, no need for colchicine at this time.  Essential  hypertension. Continue beta-blocker and clonidine.  Chronic diastolic congestive heart failure. Hold Lasix for acute kidney injury. No volume overload.  Type 2 diabetes.  Continue sliding scale insulin.  Chronic atrial fibrillation with rapid ventricle response. Continue beta-blocker, heart rate much better. Resume Eliquis.   DVT prophylaxis: Eliquis Code Status: DNR Family Communication: Son updated Disposition Plan:    Status is: Inpatient  Remains inpatient appropriate because: Severity of disease, IV fluids.  Antibiotics        No intake/output data recorded. Total I/O In: -  Out: 300 [Urine:300]     Consultants:  None  Procedures: None  Antimicrobials: Rocephin and Zithromax.  Subjective: Patient has some difficulty with urination, residual 360 post void. He denies any short of breath, no chest pain.  No cough. No abdominal pain or nausea vomiting, no diarrhea  Objective: Vitals:   11/21/21 2218 11/22/21 0244 11/22/21 0503 11/22/21 0824  BP: 122/85 (!) 127/97 (!) 110/91 128/82  Pulse: 87 92 (!) 52 (!) 101  Resp: 16  18 17   Temp: 99.6 F (37.6 C) 98.3 F (36.8 C) 98.5 F (36.9 C) 97.6 F (36.4 C)  TempSrc: Oral Oral    SpO2: 96% 100% 97% 98%  Weight:      Height:        Intake/Output Summary (Last 24 hours) at 11/22/2021 1024 Last data filed at 11/22/2021 0830 Gross per 24 hour  Intake --  Output 300 ml  Net -300 ml   Filed Weights   11/21/21 1800  Weight: 79.4 kg    Examination:  General exam: Appears calm and comfortable  Respiratory system: Clear to auscultation. Respiratory effort normal. Cardiovascular system: Irregular. No JVD, murmurs, rubs, gallops or clicks.  No pedal edema. Gastrointestinal system: Abdomen is nondistended, soft and nontender. No organomegaly or masses felt. Normal bowel sounds heard. Central nervous system: Alert and oriented. No focal neurological deficits. Extremities: Symmetric 5 x 5 power. Skin: No  rashes, lesions or ulcers Psychiatry: Judgement and insight appear normal. Mood & affect appropriate.     Data Reviewed: I have personally reviewed following labs and imaging studies  CBC: Recent Labs  Lab 11/21/21 1803 11/22/21 0515  WBC 17.3* 12.1*  HGB 14.8 14.1  HCT 46.0 43.2  MCV 85.3 83.1  PLT 198 161   Basic Metabolic Panel: Recent Labs  Lab 11/21/21 1803 11/22/21 0515  NA 139 136  K 4.6 4.6  CL 107 109  CO2 20* 19*  GLUCOSE 125* 148*  BUN 57* 58*  CREATININE 3.64* 3.63*  CALCIUM 9.4 8.9   GFR: Estimated Creatinine Clearance: 17.2 mL/min (A) (by C-G formula based on SCr of 3.63 mg/dL (H)). Liver Function Tests: No results for input(s): AST, ALT, ALKPHOS, BILITOT, PROT, ALBUMIN in the last 168 hours. No results for input(s): LIPASE, AMYLASE in the last 168 hours. No results for input(s): AMMONIA in the last 168 hours. Coagulation Profile: No results for input(s): INR, PROTIME in the last 168 hours. Cardiac Enzymes: No results for input(s): CKTOTAL, CKMB, CKMBINDEX, TROPONINI in the last 168 hours. BNP (last 3 results) No results for input(s): PROBNP in the last 8760 hours. HbA1C: No results for input(s): HGBA1C in the last 72 hours. CBG: Recent Labs  Lab 11/22/21 0858  GLUCAP 110*   Lipid Profile: No results for input(s): CHOL, HDL, LDLCALC, TRIG, CHOLHDL, LDLDIRECT in the last 72 hours. Thyroid Function Tests: No results for input(s): TSH, T4TOTAL, FREET4, T3FREE, THYROIDAB in the last 72 hours. Anemia Panel: No results for input(s): VITAMINB12, FOLATE, FERRITIN, TIBC, IRON, RETICCTPCT in the last 72 hours. Sepsis Labs: Recent Labs  Lab 11/21/21 1803 11/21/21 2200 11/22/21 0515  PROCALCITON  --  5.94 5.40  LATICACIDVEN 1.9  --   --     Recent Results (from the past 240 hour(s))  Resp Panel by RT-PCR (Flu A&B, Covid) Nasopharyngeal Swab     Status: None   Collection Time: 11/22/21  1:00 AM   Specimen: Nasopharyngeal Swab; Nasopharyngeal(NP)  swabs in vial transport medium  Result Value Ref Range Status   SARS Coronavirus 2 by RT PCR NEGATIVE NEGATIVE Final    Comment: (NOTE) SARS-CoV-2 target nucleic acids are NOT DETECTED.  The SARS-CoV-2 RNA is generally detectable in upper respiratory specimens during the acute phase of infection. The lowest concentration of SARS-CoV-2 viral copies this assay can detect is 138 copies/mL. A negative result does not preclude SARS-Cov-2 infection and should not be used as the sole basis for treatment or other patient management decisions. A negative result may occur with  improper specimen collection/handling, submission of specimen other than nasopharyngeal swab, presence of viral mutation(s) within the areas targeted by this assay, and inadequate number of viral copies(<138 copies/mL). A negative result must be combined with clinical observations, patient history, and epidemiological information. The expected result is Negative.  Fact Sheet for Patients:  EntrepreneurPulse.com.au  Fact Sheet for Healthcare Providers:  IncredibleEmployment.be  This test is no t yet approved or cleared by the [place] FDA and  has been authorized for detection and/or diagnosis of SARS-CoV-2 by FDA under an Emergency Use Authorization (EUA). This EUA will remain  in effect (meaning this test can be used) for the duration of the COVID-19 declaration under Section  564(b)(1) of the Act, 21 U.S.C.section 360bbb-3(b)(1), unless the authorization is terminated  or revoked sooner.       Influenza A by PCR NEGATIVE NEGATIVE Final   Influenza B by PCR NEGATIVE NEGATIVE Final    Comment: (NOTE) The Xpert Xpress SARS-CoV-2/FLU/RSV plus assay is intended as an aid in the diagnosis of influenza from Nasopharyngeal swab specimens and should not be used as a sole basis for treatment. Nasal washings and aspirates are unacceptable for Xpert Xpress  SARS-CoV-2/FLU/RSV testing.  Fact Sheet for Patients: EntrepreneurPulse.com.au  Fact Sheet for Healthcare Providers: IncredibleEmployment.be  This test is not yet approved or cleared by the [place] FDA and has been authorized for detection and/or diagnosis of SARS-CoV-2 by FDA under an Emergency Use Authorization (EUA). This EUA will remain in effect (meaning this test can be used) for the duration of the COVID-19 declaration under Section 564(b)(1) of the Act, 21 U.S.C. section 360bbb-3(b)(1), unless the authorization is terminated or revoked.  Performed at [facility], [address], [place], [place] [address]          Radiology Studies: DG Chest 2 View  Result Date: 11/21/2021 CLINICAL DATA:  Leukocytosis EXAM: CHEST - 2 VIEW COMPARISON:  06/28/2021 FINDINGS: Transverse diameter of heart is increased. There is poor inspiration. There are no signs of alveolar pulmonary edema or focal pulmonary consolidation. There is no pleural effusion or pneumothorax. IMPRESSION: Cardiomegaly. Poor inspiration. There are no signs of pulmonary edema or focal pulmonary consolidation. Electronically Signed   By: Elmer Picker M.D.   On: 11/21/2021 18:40        Scheduled Meds:  allopurinol  50 mg Oral Daily   apixaban  2.5 mg Oral BID   aspirin  325 mg Oral Daily   atorvastatin  40 mg Oral QPM   azithromycin  500 mg Oral Daily   cloNIDine  0.1 mg Oral BID   famotidine  20 mg Oral BID   hydrALAZINE  25 mg Oral TID   [START ON 11/23/2021] influenza vaccine adjuvanted  0.5 mL Intramuscular Tomorrow-1000   insulin aspart  0-9 Units Subcutaneous TID AC & HS   isosorbide mononitrate  30 mg Oral Daily   metoprolol tartrate  50 mg Oral BID   Continuous Infusions:  cefTRIAXone (ROCEPHIN)  IV     lactated ringers       LOS: 0 days    Time spent:     Sharen Hones, MD [facility]   To contact the attending provider  between 7A-7P or the covering provider during after hours 7P-7A, please log into the web site [email or web address] and access using universal [place] password for that web site. If you do not have the password, please call the hospital operator.  11/22/2021, 10:24 AM

## 2021-11-22 NOTE — Progress Notes (Signed)
*  PRELIMINARY RESULTS* Echocardiogram 2D Echocardiogram has been performed.  Claretta Fraise 11/22/2021, 11:53 AM

## 2021-11-22 NOTE — TOC Initial Note (Addendum)
Transition of Care [facility]) - Initial/Assessment Note    Patient Details  Name: George Bolton MRN: [id number] Date of Birth: Nov 25, 1940  [facility] CM/SW Contact:    Conception Oms, RN Phone Number: 11/22/2021, 11:17 AM  Clinical Narrative:          Patient's son brought him in from home, TOC to follow for needs    , being treated for Possible PNA with ABX,            Patient Goals and CMS Choice        Expected Discharge Plan and Services                                                Prior Living Arrangements/Services                       Activities of Daily Living Home Assistive Devices/Equipment: Grab bars in shower, Grab bars around toilet, Shower chair with back, Walker (specify type) ADL Screening (condition at time of admission) Patient's cognitive ability adequate to safely complete daily activities?: Yes Is the patient deaf or have difficulty hearing?: No Does the patient have difficulty seeing, even when wearing glasses/contacts?: No Does the patient have difficulty concentrating, remembering, or making decisions?: No Patient able to express need for assistance with ADLs?: Yes Does the patient have difficulty dressing or bathing?: Yes Independently performs ADLs?: No Communication: Independent Dressing (OT): Needs assistance Is this a change from baseline?: Change from baseline, expected to last <3days Grooming: Appropriate for developmental age Feeding: Independent Bathing: Needs assistance Is this a change from baseline?: Change from baseline, expected to last <3 days Toileting: Independent with device (comment) In/Out Bed: Needs assistance Is this a change from baseline?: Change from baseline, expected to last <3 days Walks in Home: Needs assistance Is this a change from baseline?: Change from baseline, expected to last <3 days Does the patient have difficulty walking or climbing stairs?: Yes Weakness of Legs:  Both Weakness of Arms/Hands: Both  Permission Sought/Granted                  Emotional Assessment              Admission diagnosis:  Dehydration [E86.0] Elevated troponin [R77.8] Generalized weakness [R53.1] Acute renal failure, unspecified acute renal failure type ([place]) [N17.9] Acute kidney injury superimposed on chronic kidney disease ([place]) [N17.9, N18.9] Leukocytosis, unspecified type [D72.829] Patient Active Problem List   Diagnosis Date Noted   Acute kidney injury superimposed on chronic kidney disease ([place]) 11/22/2021   Atrial fibrillation with RVR ([place]) 05/21/2021   CKD (chronic kidney disease), stage III ([place]) 05/21/2021   Chronic diastolic congestive heart failure ([place]) 12/01/2018   Atrial fibrillation ([place]) 03/15/2018   Lymphedema 03/15/2018   Gout 01/25/2017   CHF (congestive heart failure) ([place]) 04/25/2015   Hypertension 04/25/2015   PCP:  Denton Lank, MD Pharmacy:   [facility] [address]), [place]) [place] [address] Phone: [phone] Fax: [phone]     Social Determinants of Health (SDOH) Interventions    Readmission Risk Interventions No flowsheet data found.

## 2021-11-22 NOTE — H&P (Addendum)
[place]   PATIENT NAME: George Bolton    MR#:  [id number]  DATE OF BIRTH:  1941-10-08  DATE OF ADMISSION:  11/22/2021  PRIMARY CARE PHYSICIAN: Denton Lank, MD   Patient is coming from: Home  REQUESTING/REFERRING PHYSICIAN: Lurline Hare, MD  CHIEF COMPLAINT:   Chief Complaint  Patient presents with   Weakness    HISTORY OF PRESENT ILLNESS:  George Bolton is a 81 y.o. African-American male with medical history significant for chronic atrial fibrillation, chronic diastolic CHF, gout and hypertension, presented to the ER with acute onset of generalized weakness with diminished p.o. intake and abnormal labs that were checked by PCP with elevated creatinine.  He admits to chills without fever.  He denies any cough or wheezing or dyspnea.  No chest pain or palpitations.  No nausea or vomiting or diarrhea.  No dysuria, oliguria or hematuria or flank pain.  ED Course: When he came to the ER heart rate was 114 with respiratory rate of 22 and otherwise unremarkable CMP.  Labs revealed a BUN of 57 and creatinine 3.64 compared to 27 and 1.33 on 07/01/2021.  I sensitive troponin I was 75 and later 88 procalcitonin was 5.94 and CBC showed leukocytosis 17.3.  Influenza antigens and COVID-19 PCR came back negative. EKG as reviewed by me : EKG showed atrial fibrillation with rapid ventricular response of 114, with right axis deviation and poor R wave progression with T wave inversion inferiorly Imaging: Chest x-ray showed cardiomegaly with poor inspiration.  The patient was given 500 mill IV normal saline bolus.  He will be admitted to a medical telemetry bed for further evaluation and management. PAST MEDICAL HISTORY:   Past Medical History:  Diagnosis Date   Chronic atrial fibrillation ([place])    a. Afib dates back to at least 2002 when reviewing prior EKG with EKGs from 2014 onward showing Afib; b. CHADS2VASc at least 4 (CHF, HTN, age x 2)   Chronic diastolic CHF (congestive heart  failure) ([place])    a. TTE 5/19: EF 70-75%, mod LVH, near complete obliteration of the mid and apical LV cavity (can be seen in apical hypertrophic CM), mildly dilated LA, RV cavity size nl, RV wall thickness nl, RVSF mildly reduced, mildly dilated RA; b. 02/2018 Limited echo w/ definity: EF 50-65%, no apical hypertrophic CM.   Gout    Hypertension     PAST SURGICAL HISTORY:   Past Surgical History:  Procedure Laterality Date   CHOLECYSTECTOMY N/A 01/13/2017   Procedure: LAPAROSCOPIC CHOLECYSTECTOMY;  Surgeon: Jules Husbands, MD;  Location: [facility] ORS;  Service: General;  Laterality: N/A;    SOCIAL HISTORY:   Social History   Tobacco Use   Smoking status: Never   Smokeless tobacco: Never  Substance Use Topics   Alcohol use: No    FAMILY HISTORY:   Family History  Problem Relation Age of Onset   Stroke Mother     DRUG ALLERGIES:   Allergies  Allergen Reactions   Cortisone Anaphylaxis   Triamcinolone     Other reaction(s): Unknown    REVIEW OF SYSTEMS:   ROS As per history of present illness. All pertinent systems were reviewed above. Constitutional, HEENT, cardiovascular, respiratory, GI, GU, musculoskeletal, neuro, psychiatric, endocrine, integumentary and hematologic systems were reviewed and are otherwise negative/unremarkable except for positive findings mentioned above in the HPI.   MEDICATIONS AT HOME:   Prior to Admission medications   Medication Sig Start Date End Date Taking? Authorizing  Provider  apixaban (ELIQUIS) 2.5 MG TABS tablet Take 2.5 mg by mouth 2 (two) times daily. 12/27/19  Yes [provider]  aspirin 325 MG tablet Take 650 mg by mouth daily.   Yes [provider]  atorvastatin (LIPITOR) 40 MG tablet Take 1 tablet by mouth daily.   Yes [provider]  cloNIDine (CATAPRES) 0.1 MG tablet Take 0.1 mg by mouth 2 (two) times daily. 11/07/21  Yes [provider]  famotidine (PEPCID) 20 MG tablet Take 1 tablet  (20 mg total) by mouth 2 (two) times daily. 02/15/21  Yes Carrie Mew, MD  felodipine (PLENDIL) 10 MG 24 hr tablet Take 10 mg by mouth daily. 09/30/21  Yes [provider]  furosemide (LASIX) 40 MG tablet Take 1 tablet (40 mg total) by mouth daily as needed for fluid. 05/24/21  Yes Dahal, Marlowe Aschoff, MD  hydrALAZINE (APRESOLINE) 25 MG tablet Take 25 mg by mouth 3 (three) times daily. 11/21/21  Yes [provider]  isosorbide mononitrate (IMDUR) 30 MG 24 hr tablet Take 30 mg by mouth daily. 01/31/21  Yes [provider]  metoprolol tartrate (LOPRESSOR) 100 MG tablet Take 1 tablet (100 mg total) by mouth 2 (two) times daily. Patient taking differently: Take 50 mg by mouth 2 (two) times daily. 05/24/21 11/22/21 Yes Dahal, Marlowe Aschoff, MD  allopurinol (ZYLOPRIM) 100 MG tablet Take 50 mg by mouth daily. Patient not taking: Reported on 06/29/2021 06/14/21   [provider]  colchicine 0.6 MG tablet Take 0.6 mg by mouth daily as needed (gout flares). Patient not taking: Reported on 11/22/2021 06/14/21   [provider]      VITAL SIGNS:  Blood pressure 122/85, pulse 87, temperature 99.6 F (37.6 C), temperature source Oral, resp. rate 16, height 6' (1.829 m), weight 79.4 kg, SpO2 96 %.  PHYSICAL EXAMINATION:  Physical Exam  GENERAL:  81 y.o.-year-old African-American male patient lying in the bed with no acute distress.  EYES: Pupils equal, round, reactive to light and accommodation. No scleral icterus. Extraocular muscles intact.  HEENT: Head atraumatic, normocephalic. Oropharynx and nasopharynx clear.  NECK:  Supple, no jugular venous distention. No thyroid enlargement, no tenderness.  LUNGS: Normal breath sounds bilaterally, no wheezing, rales,rhonchi or crepitation. No use of accessory muscles of respiration.  CARDIOVASCULAR: Regular rate and rhythm, S1, S2 normal. No murmurs, rubs, or gallops.  ABDOMEN: Soft, nondistended, nontender. Bowel sounds present. No  organomegaly or mass.  EXTREMITIES: 1+ bilateral lower extremity pitting edema with no cyanosis, or clubbing.  NEUROLOGIC: Cranial nerves II through XII are intact. Muscle strength 5/5 in all extremities. Sensation intact. Gait not checked.  PSYCHIATRIC: The patient is alert and oriented x 3.  Normal affect and good eye contact. SKIN: No obvious rash, lesion, or ulcer.   LABORATORY PANEL:   CBC Recent Labs  Lab 11/21/21 1803  WBC 17.3*  HGB 14.8  HCT 46.0  PLT 198   ------------------------------------------------------------------------------------------------------------------  Chemistries  Recent Labs  Lab 11/21/21 1803  NA 139  K 4.6  CL 107  CO2 20*  GLUCOSE 125*  BUN 57*  CREATININE 3.64*  CALCIUM 9.4   ------------------------------------------------------------------------------------------------------------------  Cardiac Enzymes No results for input(s): TROPONINI in the last 168 hours. ------------------------------------------------------------------------------------------------------------------  RADIOLOGY:  DG Chest 2 View  Result Date: 11/21/2021 CLINICAL DATA:  Leukocytosis EXAM: CHEST - 2 VIEW COMPARISON:  06/28/2021 FINDINGS: Transverse diameter of heart is increased. There is poor inspiration. There are no signs of alveolar pulmonary edema or focal pulmonary consolidation. There  is no pleural effusion or pneumothorax. IMPRESSION: Cardiomegaly. Poor inspiration. There are no signs of pulmonary edema or focal pulmonary consolidation. Electronically Signed   By: Elmer Picker M.D.   On: 11/21/2021 18:40      IMPRESSION AND PLAN:  Principal Problem:   Acute kidney injury superimposed on chronic kidney disease ([place])  1.  Acute kidney injury superimposed on stage IIIa chronic kidney disease likely prerenal secondary to volume depletion and dehydration. - The patient will be admitted to a medical monitored bed. - We will continue gentle hydration  with IV normal saline. - We will follow BMPs. - We will avoid nephrotoxins.  2.  Atrial fibrillation with rapid ventricular response with associated elevated troponin. - We will continue Lopressor and Eliquis. - We will follow troponins and check 2D echo. - We will obtain a cardiology consult. - I notified Dr. Clayborn Bigness about the patient  3.  Gout. - We will continue allopurinol and colchicine.  4.  Dyslipidemia. - We will continue Lipitor.  5.  Essential hypertension. - We will continue Lopressor and hydralazine as well as Imdur.  6.  Chronic diastolic CHF. - This is fairly compensated. - Holding Lasix given acute kidney injury.  7.  Type 2 diabetes mellitus. - The patient will be placed on supplement coverage with NovoLog.  DVT prophylaxis: Eliquis. Code Status: The patient is DNR/DNI. Family Communication:  The plan of care was discussed in details with the patient (and family). I answered all questions. The patient agreed to proceed with the above mentioned plan. Further management will depend upon hospital course. Disposition Plan: Back to previous home environment Consults called: none. All the records are reviewed and case discussed with ED provider.  Status is: Inpatient  At the time of the admission, it appears that the appropriate admission status for this patient is inpatient.  This is judged to be reasonable and necessary in order to provide the required intensity of service to ensure the patient's safety given the presenting symptoms, physical exam findings and initial radiographic and laboratory data in the context of comorbid conditions.  The patient requires inpatient status due to high intensity of service, high risk of further deterioration and high frequency of surveillance required.  I certify that at the time of admission, it is my clinical judgment that the patient will require inpatient hospital care extending more than 2 midnights.                             Dispo: The patient is from: Home              Anticipated d/c is to: Home              Patient currently is not medically stable to d/c.              Difficult to place patient: No   Christel Mormon M.D on 11/22/2021 at 1:30 AM  [facility]   From 7 PM-7 AM, contact night-coverage [email or web address]  CC: Primary care physician; Denton Lank, MD

## 2021-11-22 NOTE — Progress Notes (Signed)
PHARMACIST - PHYSICIAN COMMUNICATION  CONCERNING:  Enoxaparin (Lovenox) for DVT Prophylaxis    RECOMMENDATION: Patient was prescribed enoxaprin 40mg  q24 hours for VTE prophylaxis.   Filed Weights   11/21/21 1800  Weight: 79.4 kg (175 lb)    Body mass index is 23.73 kg/m.  Estimated Creatinine Clearance: 17.2 mL/min (A) (by C-G formula based on SCr of 3.64 mg/dL (H)).  Patient is candidate for enoxaparin 30mg  every 24 hours based on CrCl <25ml/min or Weight <45kg  DESCRIPTION: Pharmacy has adjusted enoxaparin dose per [facility] policy.  Patient is now receiving enoxaparin 30 mg every 24 hours   Renda Rolls, PharmD, [facility] 11/22/2021 1:34 AM

## 2021-11-22 NOTE — ED Provider Notes (Signed)
[facility] Provider Note    Event Date/Time   First MD Initiated Contact with Patient 11/22/21 0033     (approximate)   History   Weakness   HPI  History obtained via patient and his son  ANEES VANECEK is a 81 y.o. male referred to the ED by his PCP for generalized weakness, elevated white count, renal failure.  Patient has a history of atrial fibrillation on Eliquis, CHF on as needed Lasix, hypertension, CKD who has not been feeling well for the past few days with decreased oral intake.  Had some breathing difficulty the day before seeing his PCP so took as needed Lasix even though his legs were not swollen.  Had some constipation issues but took over-the-counter's and has had satisfactory bowel movement since.  Denies fever, cough, chest pain, abdominal pain, nausea or vomiting.  Still urinating but notes urine output has decreased.     Past Medical History   Past Medical History:  Diagnosis Date   Chronic atrial fibrillation ([place])    a. Afib dates back to at least 2002 when reviewing prior EKG with EKGs from 2014 onward showing Afib; b. CHADS2VASc at least 4 (CHF, HTN, age x 2)   Chronic diastolic CHF (congestive heart failure) ([place])    a. TTE 5/19: EF 70-75%, mod LVH, near complete obliteration of the mid and apical LV cavity (can be seen in apical hypertrophic CM), mildly dilated LA, RV cavity size nl, RV wall thickness nl, RVSF mildly reduced, mildly dilated RA; b. 02/2018 Limited echo w/ definity: EF 50-65%, no apical hypertrophic CM.   Gout    Hypertension      Active Problem List   Patient Active Problem List   Diagnosis Date Noted   Atrial fibrillation with RVR ([place]) 05/21/2021   CKD (chronic kidney disease), stage III ([place]) 05/21/2021   Chronic diastolic congestive heart failure ([place]) 12/01/2018   Atrial fibrillation ([place]) 03/15/2018   Lymphedema 03/15/2018   Gout 01/25/2017   CHF (congestive heart failure) ([place]) 04/25/2015    Hypertension 04/25/2015     Past Surgical History   Past Surgical History:  Procedure Laterality Date   CHOLECYSTECTOMY N/A 01/13/2017   Procedure: LAPAROSCOPIC CHOLECYSTECTOMY;  Surgeon: Jules Husbands, MD;  Location: [facility] ORS;  Service: General;  Laterality: N/A;     Home Medications   Prior to Admission medications   Medication Sig Start Date End Date Taking? Authorizing Provider  allopurinol (ZYLOPRIM) 100 MG tablet Take 50 mg by mouth daily. Patient not taking: Reported on 06/29/2021 06/14/21   [provider]  apixaban (ELIQUIS) 2.5 MG TABS tablet Take 2.5 mg by mouth 2 (two) times daily. 12/27/19   [provider]  aspirin 325 MG tablet Take 650 mg by mouth daily.    [provider]  atorvastatin (LIPITOR) 40 MG tablet Take 1 tablet by mouth daily.    [provider]  cloNIDine (CATAPRES) 0.1 MG tablet Take 0.1 mg by mouth 2 (two) times daily. 11/07/21   [provider]  colchicine 0.6 MG tablet Take 0.6 mg by mouth daily as needed (gout flares). 06/14/21   [provider]  famotidine (PEPCID) 20 MG tablet Take 1 tablet (20 mg total) by mouth 2 (two) times daily. 02/15/21   Carrie Mew, MD  felodipine (PLENDIL) 10 MG 24 hr tablet Take 10 mg by mouth daily. 09/30/21   [provider]  furosemide (LASIX) 40 MG tablet Take 1 tablet (40 mg total)  by mouth daily as needed for fluid. 05/24/21   Terrilee Croak, MD  hydrALAZINE (APRESOLINE) 25 MG tablet Take 25 mg by mouth 3 (three) times daily. 11/21/21   [provider]  isosorbide mononitrate (IMDUR) 30 MG 24 hr tablet Take 30 mg by mouth daily. 01/31/21   [provider]  metoprolol tartrate (LOPRESSOR) 100 MG tablet Take 1 tablet (100 mg total) by mouth 2 (two) times daily. 05/24/21 08/22/21  Terrilee Croak, MD     Allergies  Cortisone and Triamcinolone   Family History   Family History  Problem Relation Age of Onset   Stroke Mother      Physical  Exam  Triage Vital Signs: ED Triage Vitals  Enc Vitals Group     BP 11/21/21 1758 103/69     Pulse Rate 11/21/21 1758 (!) 114     Resp 11/21/21 1758 (!) 22     Temp 11/21/21 1758 98 F (36.7 C)     Temp Source 11/21/21 1758 Oral     SpO2 11/21/21 1758 97 %     Weight 11/21/21 1800 175 lb (79.4 kg)     Height 11/21/21 1800 6' (1.829 m)     Head Circumference --      Peak Flow --      Pain Score 11/21/21 1800 0     Pain Loc --      Pain Edu? --      Excl. in [place]? --     Updated Vital Signs: BP 122/85 (BP Location: Left Arm)    Pulse 87    Temp 99.6 F (37.6 C) (Oral)    Resp 16    Ht 6' (1.829 m)    Wt 79.4 kg    SpO2 96%    BMI 23.73 kg/m    General: Awake, no distress.  CV:  Irregularly irregular rhythm. Good peripheral perfusion.  Resp:  Normal effort.  CTAB. Abd:  Nontender.  No distention.  Other:  BLE chronic venous stasis ulcers.  1+ nonpitting BLE edema.   ED Results / Procedures / Treatments  Labs (all labs ordered are listed, but only abnormal results are displayed) Labs Reviewed  CBC - Abnormal; Notable for the following components:      Result Value   WBC 17.3 (*)    RDW 16.5 (*)    All other components within normal limits  BASIC METABOLIC PANEL - Abnormal; Notable for the following components:   CO2 20 (*)    Glucose, Bld 125 (*)    BUN 57 (*)    Creatinine, Ser 3.64 (*)    GFR, Estimated 16 (*)    All other components within normal limits  TROPONIN I (HIGH SENSITIVITY) - Abnormal; Notable for the following components:   Troponin I (High Sensitivity) 75 (*)    All other components within normal limits  TROPONIN I (HIGH SENSITIVITY) - Abnormal; Notable for the following components:   Troponin I (High Sensitivity) 88 (*)    All other components within normal limits  RESP PANEL BY RT-PCR (FLU A&B, COVID) ARPGX2  LACTIC ACID, PLASMA  URINALYSIS, COMPLETE (UACMP) WITH MICROSCOPIC  PROCALCITONIN     EKG  ED ECG REPORT I, Brylyn Novakovich J, the  attending physician, personally viewed and interpreted this ECG.   Date: 11/22/2021  EKG Time: 1811  Rate: 114  Rhythm: atrial fibrillation, rate 114  Axis: Normal  Intervals:none  ST&T Change: Nonspecific    RADIOLOGY I have personally reviewed patient's chest x-ray as  well as the radiology interpretation:  Chest x-ray: Cardiomegaly, otherwise unremarkable  Official radiology report(s): DG Chest 2 View  Result Date: 11/21/2021 CLINICAL DATA:  Leukocytosis EXAM: CHEST - 2 VIEW COMPARISON:  06/28/2021 FINDINGS: Transverse diameter of heart is increased. There is poor inspiration. There are no signs of alveolar pulmonary edema or focal pulmonary consolidation. There is no pleural effusion or pneumothorax. IMPRESSION: Cardiomegaly. Poor inspiration. There are no signs of pulmonary edema or focal pulmonary consolidation. Electronically Signed   By: Elmer Picker M.D.   On: 11/21/2021 18:40     PROCEDURES:  Critical Care performed: No  .1-3 Lead EKG Interpretation Performed by: Paulette Blanch, MD Authorized by: Paulette Blanch, MD     Interpretation: normal     ECG rate:  87   ECG rate assessment: normal     Rhythm: sinus rhythm     Ectopy: none     Conduction: normal   Comments:     Patient placed on cardiac monitor to evaluate for arrhythmias   MEDICATIONS ORDERED IN ED: Medications  sodium chloride 0.9 % bolus 500 mL (has no administration in time range)     IMPRESSION / MDM / ASSESSMENT AND PLAN / ED COURSE  I reviewed the triage vital signs and the nursing notes.                             81 year old male sent to the ED for generalized weakness and abnormal labs.  Differential diagnosis includes but is not limited to ACS, infectious, metabolic etiologies, etc.  I am unable to see patient's PCP visit from yesterday so that history is given by patient's son.  I have personally reviewed patient's records and see that patient was admitted in 06/28/2021 to the hospital  for sepsis secondary to cellulitis.  The patient is on the cardiac monitor to evaluate for evidence of arrhythmia and/or significant heart rate changes.  Laboratory results notable for leukocytosis, WBC 17.3, acute renal failure BUN 57/CR 3.64; this is significantly elevated from 4 months ago.  Troponin is mildly elevated which is likely secondary to uremia.  Chest x-ray unremarkable.  Lactic acid 1.9; source for infection.  Will check procalcitonin, UA, respiratory panel.  Will start judicious fluids.  Will consult hospitalist services for evaluation and admission.      FINAL CLINICAL IMPRESSION(S) / ED DIAGNOSES   Final diagnoses:  Generalized weakness  Acute renal failure, unspecified acute renal failure type (HCC)  Dehydration  Leukocytosis, unspecified type  Elevated troponin     Rx / DC Orders   ED Discharge Orders     None        Note:  This document was prepared using Dragon voice recognition software and may include unintentional dictation errors.   Paulette Blanch, MD 11/22/21 [phone]

## 2021-11-23 ENCOUNTER — Inpatient Hospital Stay: Payer: Medicare Other

## 2021-11-23 DIAGNOSIS — J189 Pneumonia, unspecified organism: Secondary | ICD-10-CM

## 2021-11-23 DIAGNOSIS — E872 Acidosis, unspecified: Secondary | ICD-10-CM

## 2021-11-23 HISTORY — DX: Pneumonia, unspecified organism: J18.9

## 2021-11-23 LAB — CBC WITH DIFFERENTIAL/PLATELET
Abs Immature Granulocytes: 0.06 10*3/uL (ref 0.00–0.07)
Basophils Absolute: 0 10*3/uL (ref 0.0–0.1)
Basophils Relative: 0 %
Eosinophils Absolute: 0 10*3/uL (ref 0.0–0.5)
Eosinophils Relative: 0 %
HCT: 36.9 % — ABNORMAL LOW (ref 39.0–52.0)
Hemoglobin: 11.9 g/dL — ABNORMAL LOW (ref 13.0–17.0)
Immature Granulocytes: 1 %
Lymphocytes Relative: 12 %
Lymphs Abs: 1.1 10*3/uL (ref 0.7–4.0)
MCH: 27.2 pg (ref 26.0–34.0)
MCHC: 32.2 g/dL (ref 30.0–36.0)
MCV: 84.2 fL (ref 80.0–100.0)
Monocytes Absolute: 1 10*3/uL (ref 0.1–1.0)
Monocytes Relative: 11 %
Neutro Abs: 6.7 10*3/uL (ref 1.7–7.7)
Neutrophils Relative %: 76 %
Platelets: 167 10*3/uL (ref 150–400)
RBC: 4.38 MIL/uL (ref 4.22–5.81)
RDW: 16 % — ABNORMAL HIGH (ref 11.5–15.5)
WBC: 8.8 10*3/uL (ref 4.0–10.5)
nRBC: 0 % (ref 0.0–0.2)

## 2021-11-23 LAB — BASIC METABOLIC PANEL
Anion gap: 6 (ref 5–15)
BUN: 55 mg/dL — ABNORMAL HIGH (ref 8–23)
CO2: 21 mmol/L — ABNORMAL LOW (ref 22–32)
Calcium: 8.7 mg/dL — ABNORMAL LOW (ref 8.9–10.3)
Chloride: 109 mmol/L (ref 98–111)
Creatinine, Ser: 3.29 mg/dL — ABNORMAL HIGH (ref 0.61–1.24)
GFR, Estimated: 18 mL/min — ABNORMAL LOW (ref >60)
Glucose, Bld: 95 mg/dL (ref 70–99)
Potassium: 4.8 mmol/L (ref 3.5–5.1)
Sodium: 136 mmol/L (ref 135–145)

## 2021-11-23 LAB — GLUCOSE, CAPILLARY
Glucose-Capillary: 89 mg/dL (ref 70–99)
Glucose-Capillary: 136 mg/dL — ABNORMAL HIGH (ref 70–99)
Glucose-Capillary: 120 mg/dL — ABNORMAL HIGH (ref 70–99)
Glucose-Capillary: 102 mg/dL — ABNORMAL HIGH (ref 70–99)

## 2021-11-23 LAB — URIC ACID: Uric Acid, Serum: 8.4 mg/dL (ref 3.7–8.6)

## 2021-11-23 LAB — PROCALCITONIN: Procalcitonin: 2.38 ng/mL

## 2021-11-23 IMAGING — US US SCROTUM W/ DOPPLER COMPLETE
1 series · 13 of 25 positions shown · non-contrast
Comparison: [DATE]

CLINICAL DATA: Scrotal swelling x3 months

EXAM:
SCROTAL ULTRASOUND
DOPPLER ULTRASOUND OF THE TESTICLES
TECHNIQUE: Complete ultrasound examination of the testicles, epididymis, and
other scrotal structures was performed. Color and spectral Doppler
ultrasound were also utilized to evaluate blood flow to the
testicles.

[Series 1: us scrotum w/doppler · 81 acquisitions, 13 frames shown]
[im 1/81]
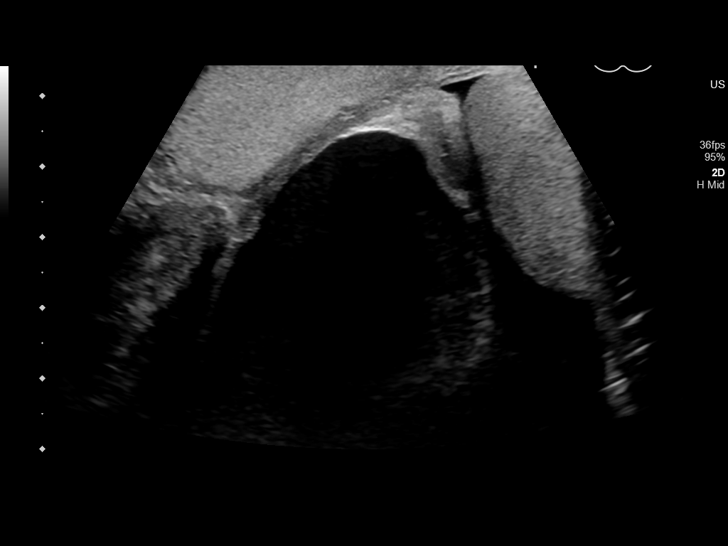
[im 7/81]
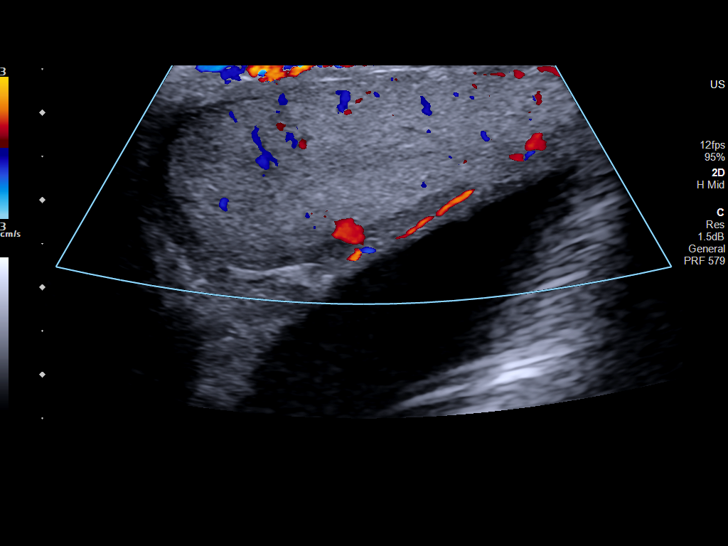
[im 14/81]
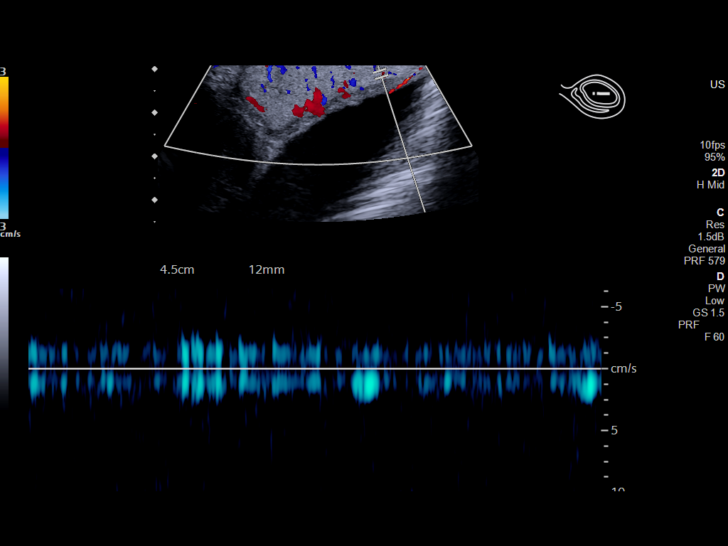
[im 21/81]
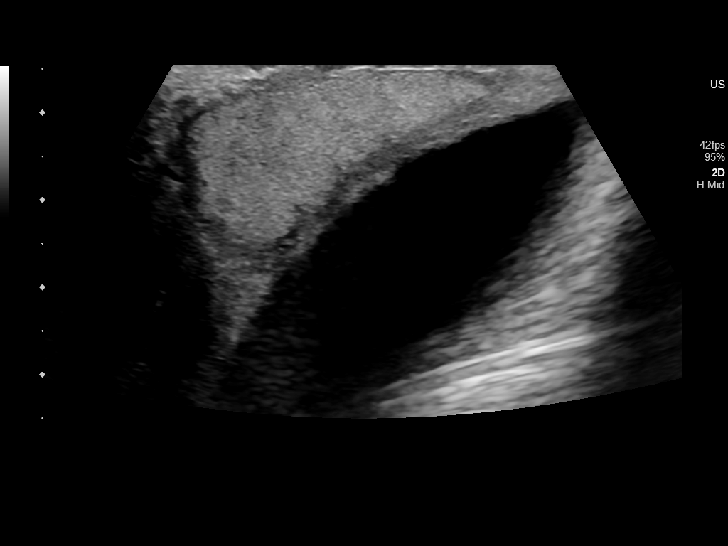
[im 27/81]
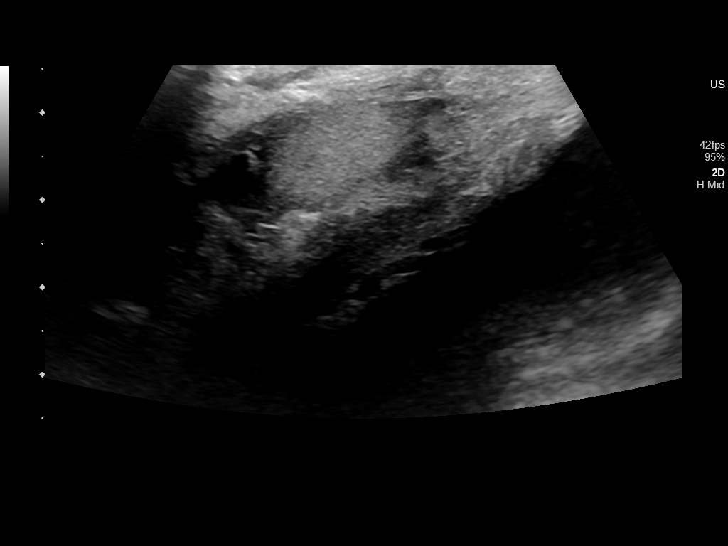
[im 34/81]
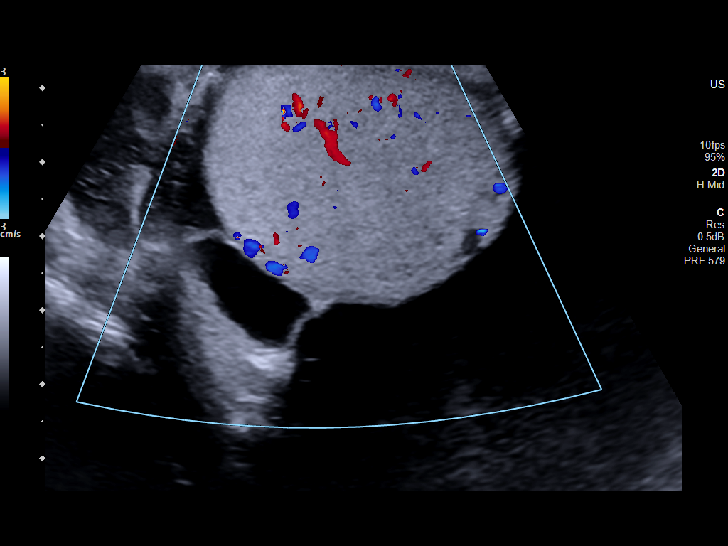
[im 41/81]
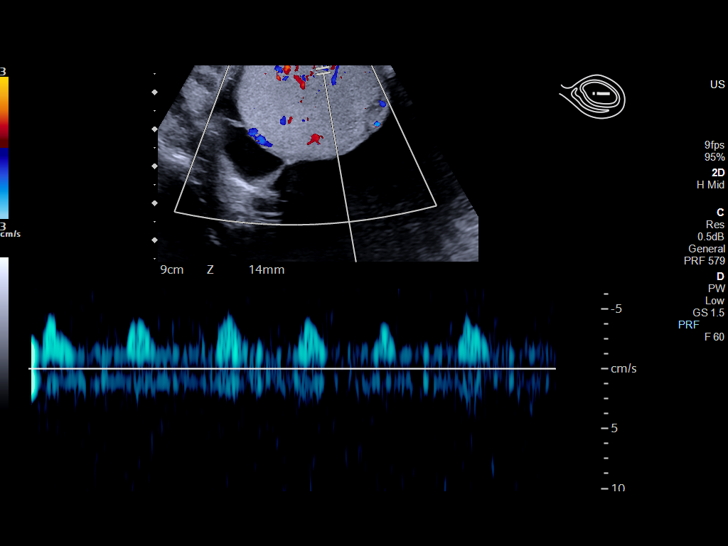
[im 47/81]
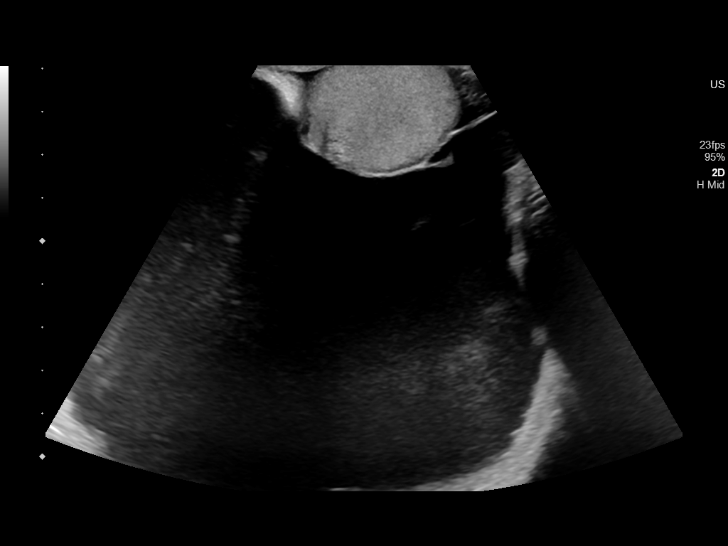
[im 54/81]
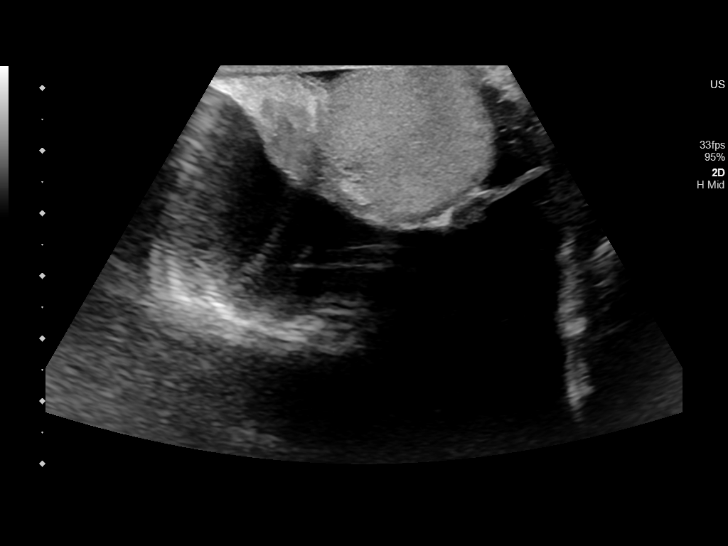
[im 61/81]
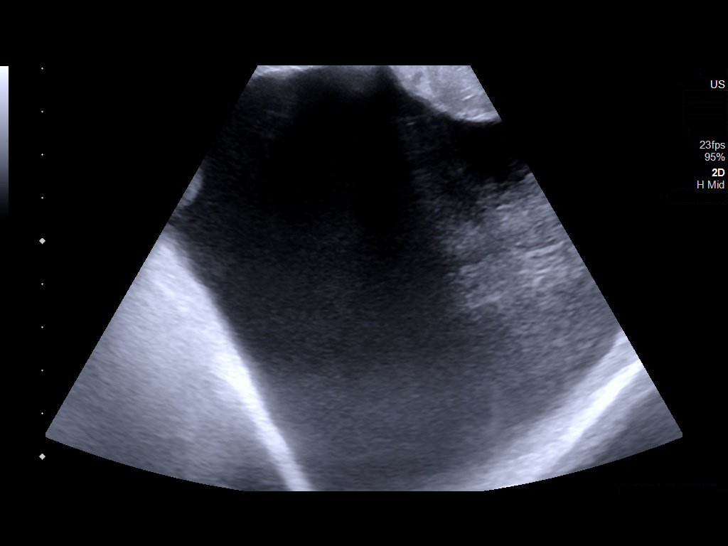
[im 67/81]
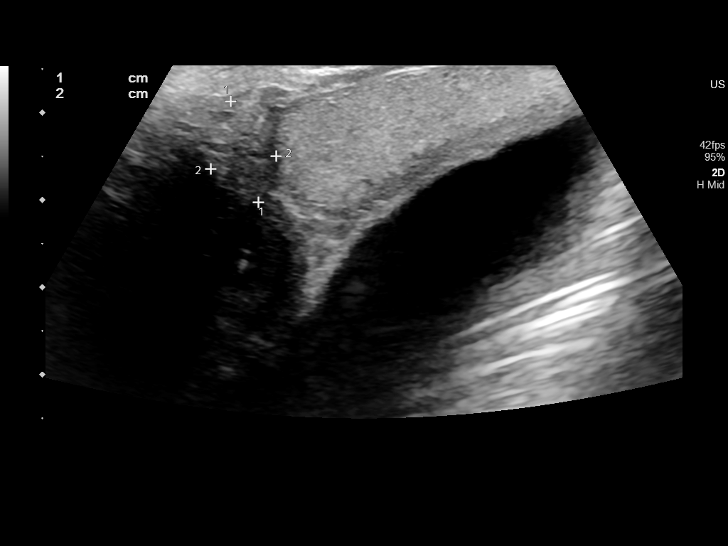
[im 74/81]
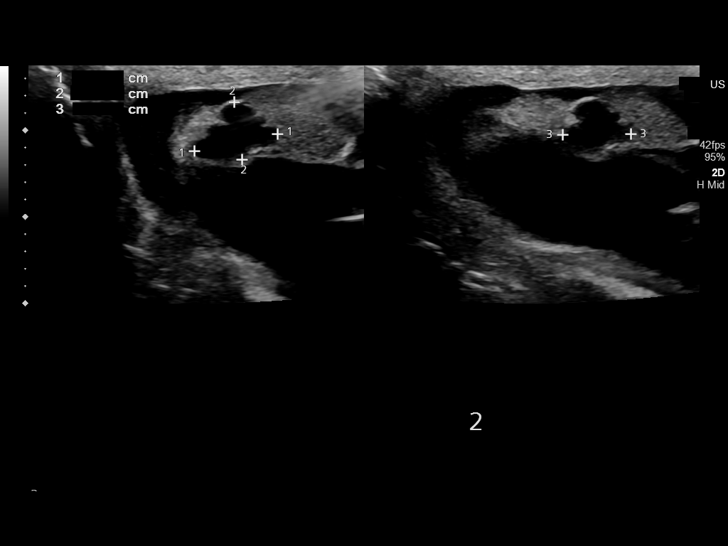
[im 81/81]
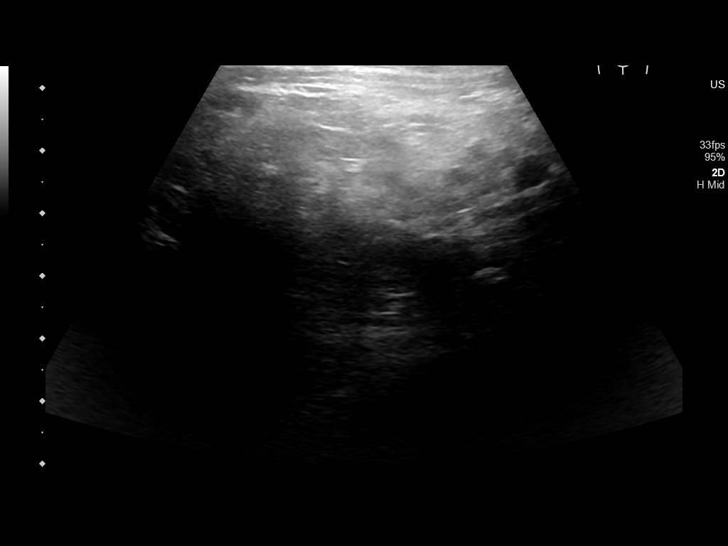

[13 of 25 positions shown; findings below may reference images not displayed]

FINDINGS: Right testicle

Measurements: 5.0 x 3.3 x 1.9 cm. No mass or microlithiasis
visualized.

Left testicle

Measurements: 3.9 x 3.1 x 3.0 cm. No mass or microlithiasis
visualized.

Right epididymis:  Normal in size and appearance.

Left epididymis: Normal in size. Cysts in the left epididymis
measuring up to 1.2 cm, containing some internal echoes.

Hydrocele: Large left hydrocele containing minimal internal debris,
similar to prior ultrasound from [8W].

Varicocele:  None visualized.

Pulsed Doppler interrogation of both testes demonstrates normal low
resistance arterial and venous waveforms bilaterally.
IMPRESSION: 1. Unremarkable sonographic appearance of the bilateral testicles,
no evidence torsion.
2. Large left hydrocele containing some minimal internal debris and
left epididymal cysts are stable dating back to [DATE].

## 2021-11-23 MED ORDER — FAMOTIDINE 20 MG PO TABS
10.0000 mg | ORAL_TABLET | Freq: Every day | ORAL | Status: DC
  Administered 2021-11-24 – 2021-11-28 (×5): 10 mg via ORAL
  Filled 2021-11-23 (×5): qty 1

## 2021-11-23 NOTE — Progress Notes (Signed)
[facility] ([place]) Cardiology    SUBJECTIVE: Patient states he feels reasonably well denies any chest pain no palpitation no leg swelling slightly confused at times laying in bed.  Still complains of generalized weakness but no significant anginal symptoms shortness of breath is reasonably controlled   Vitals:   11/22/21 2156 11/23/21 0028 11/23/21 0402 11/23/21 0821  BP: 115/86 (!) 129/91 (!) 117/93 122/88  Pulse: (!) 109 (!) 104 (!) 103 90  Resp:  16 16 18   Temp:  98.5 F (36.9 C) 98.9 F (37.2 C) 97.9 F (36.6 C)  TempSrc:  Oral Oral Oral  SpO2:  98% 96% 96%  Weight:      Height:         Intake/Output Summary (Last 24 hours) at 11/23/2021 1002 Last data filed at 11/23/2021 0800 Gross per 24 hour  Intake 1124.39 ml  Output 325 ml  Net 799.39 ml      PHYSICAL EXAM  General: Well developed, well nourished, in no acute distress HEENT:  Normocephalic and atramatic Neck:  No JVD.  Lungs: Clear bilaterally to auscultation and percussion. Heart: Irregular. Normal S1 and S2 without gallops or murmurs.  Abdomen: Bowel sounds are positive, abdomen soft and non-tender  Msk:  Back normal, normal gait. Normal strength and tone for age. Extremities: No clubbing, cyanosis  Neurological.  Alert mild disorientation Psych:  Good affect, responds appropriately   LABS: Basic Metabolic Panel: Recent Labs    11/22/21 0515 11/23/21 0613  NA 136 136  K 4.6 4.8  CL 109 109  CO2 19* 21*  GLUCOSE 148* 95  BUN 58* 55*  CREATININE 3.63* 3.29*  CALCIUM 8.9 8.7*   Liver Function Tests: No results for input(s): AST, ALT, ALKPHOS, BILITOT, PROT, ALBUMIN in the last 72 hours. No results for input(s): LIPASE, AMYLASE in the last 72 hours. CBC: Recent Labs    11/22/21 0515 11/23/21 0613  WBC 12.1* 8.8  NEUTROABS  --  6.7  HGB 14.1 11.9*  HCT 43.2 36.9*  MCV 83.1 84.2  PLT 170 167   Cardiac Enzymes: No results for input(s): CKTOTAL, CKMB, CKMBINDEX, TROPONINI in the last 72  hours. BNP: Invalid input(s): POCBNP D-Dimer: No results for input(s): DDIMER in the last 72 hours. Hemoglobin A1C: No results for input(s): HGBA1C in the last 72 hours. Fasting Lipid Panel: No results for input(s): CHOL, HDL, LDLCALC, TRIG, CHOLHDL, LDLDIRECT in the last 72 hours. Thyroid Function Tests: No results for input(s): TSH, T4TOTAL, T3FREE, THYROIDAB in the last 72 hours.  Invalid input(s): FREET3 Anemia Panel: No results for input(s): VITAMINB12, FOLATE, FERRITIN, TIBC, IRON, RETICCTPCT in the last 72 hours.  DG Chest 2 View  Result Date: 11/22/2021 CLINICAL DATA:  Weakness. EXAM: CHEST - 2 VIEW COMPARISON:  November 21, 2021. FINDINGS: Stable cardiomediastinal silhouette. Mildly increased bibasilar atelectasis or edema is noted. Bony thorax is unremarkable. IMPRESSION: Mildly increased bibasilar atelectasis or edema. Electronically Signed   By: Marijo Conception M.D.   On: 11/22/2021 12:36   DG Chest 2 View  Result Date: 11/21/2021 CLINICAL DATA:  Leukocytosis EXAM: CHEST - 2 VIEW COMPARISON:  06/28/2021 FINDINGS: Transverse diameter of heart is increased. There is poor inspiration. There are no signs of alveolar pulmonary edema or focal pulmonary consolidation. There is no pleural effusion or pneumothorax. IMPRESSION: Cardiomegaly. Poor inspiration. There are no signs of pulmonary edema or focal pulmonary consolidation. Electronically Signed   By: Elmer Picker M.D.   On: 11/21/2021 18:40   US RENAL  Result Date:  11/22/2021 CLINICAL DATA:  Renal dysfunction EXAM: RENAL / URINARY TRACT ULTRASOUND COMPLETE COMPARISON:  08/28/2020 FINDINGS: Right Kidney: Renal measurements: 9.4 x 5.3 x 4.8 cm = volume: 272.1 mL. There is increased cortical echogenicity. There is no hydronephrosis. There are multiple cysts in the right kidney largest measuring 5.4 x 5 x 5.6 cm in the upper pole. Left Kidney: Renal measurements: 13.6 x 7.6 x 5.9 cm = volume: 317.9 mL. There is no hydronephrosis.  There is increased cortical echogenicity. There are multiple cysts of varying sizes largest measuring 5.7 x 4.9 x 5.1 cm in the midportion. Overall, no significant interval changes are noted. Bladder: Appears normal for degree of bladder distention. Other: None. IMPRESSION: There is no hydronephrosis. There is increased cortical echogenicity in both kidneys suggesting medical renal disease. Bilateral renal cysts. Electronically Signed   By: Elmer Picker M.D.   On: 11/22/2021 12:42   ECHOCARDIOGRAM COMPLETE  Result Date: 11/22/2021    ECHOCARDIOGRAM REPORT   Patient Name:   [facility] Date of Exam: 11/22/2021 Medical Rec #:  [id number]       Height:       72.0 in Accession #:    [id number]      Weight:       175.0 lb Date of Birth:  [birth year]       BSA:          2.013 m Patient Age:    81 years        BP:           110/91 mmHg Patient Gender: M               HR:           122 bpm. Exam Location:  [facility] Procedure: 2D Echo Indications:     Elevated Troponin  History:         Patient has prior history of Echocardiogram examinations, most                  recent 03/03/2018.  Sonographer:     Kathlen Brunswick RDCS Referring Phys:  [id number] [place] Diagnosing Phys: [place]  1. Left ventricular ejection fraction, by estimation, is 60 to 65%. The left ventricle has normal function. The left ventricle has no regional wall motion abnormalities. There is moderate concentric left ventricular hypertrophy. Left ventricular diastolic parameters are indeterminate.  2. Right ventricular systolic function is normal. The right ventricular size is normal.  3. Left atrial size was moderately dilated.  4. The mitral valve is normal in structure. No evidence of mitral valve regurgitation. No evidence of mitral stenosis.  5. The aortic valve is normal in structure. Aortic valve regurgitation is not visualized. No aortic stenosis is present.  6. The inferior vena cava is normal in size with greater than 50%  respiratory variability, suggesting right atrial pressure of 3 mmHg. FINDINGS  Left Ventricle: Left ventricular ejection fraction, by estimation, is 60 to 65%. The left ventricle has normal function. The left ventricle has no regional wall motion abnormalities. The left ventricular internal cavity size was normal in size. There is  moderate concentric left ventricular hypertrophy. Left ventricular diastolic parameters are indeterminate. Right Ventricle: The right ventricular size is normal. No increase in right ventricular wall thickness. Right ventricular systolic function is normal. Left Atrium: Left atrial size was moderately dilated. Right Atrium: Right atrial size was normal in size. Pericardium: There is no evidence of pericardial effusion. Mitral Valve: The mitral  valve is normal in structure. No evidence of mitral valve regurgitation. No evidence of mitral valve stenosis. Tricuspid Valve: The tricuspid valve is normal in structure. Tricuspid valve regurgitation is not demonstrated. No evidence of tricuspid stenosis. Aortic Valve: The aortic valve is normal in structure. Aortic valve regurgitation is not visualized. No aortic stenosis is present. Aortic valve peak gradient measures 3.6 mmHg. Pulmonic Valve: The pulmonic valve was normal in structure. Pulmonic valve regurgitation is not visualized. No evidence of pulmonic stenosis. Aorta: The aortic root is normal in size and structure. Venous: The inferior vena cava is normal in size with greater than 50% respiratory variability, suggesting right atrial pressure of 3 mmHg. IAS/Shunts: No atrial level shunt detected by color flow Doppler.  LEFT VENTRICLE PLAX 2D LVIDd:         3.60 cm   Diastology LVIDs:         2.50 cm   LV e' medial:    5.66 cm/s LV PW:         1.60 cm   LV E/e' medial:  14.2 LV IVS:        1.60 cm   LV e' lateral:   11.90 cm/s LVOT diam:     2.00 cm   LV E/e' lateral: 6.8 LV SV:         36 LV SV Index:   18 LVOT Area:     3.14 cm  RIGHT  VENTRICLE RV Basal diam:  3.20 cm TAPSE (M-mode): 1.7 cm LEFT ATRIUM             Index        RIGHT ATRIUM           Index LA diam:        5.40 cm 2.68 cm/m   RA Area:     19.50 cm LA Vol (A2C):   55.6 ml 27.62 ml/m  RA Volume:   59.20 ml  29.41 ml/m LA Vol (A4C):   46.2 ml 22.95 ml/m LA Biplane Vol: 51.6 ml 25.63 ml/m  AORTIC VALVE                 PULMONIC VALVE AV Area (Vmax): 3.04 cm     PV Vmax:       0.80 m/s AV Vmax:        95.00 cm/s   PV Peak grad:  2.6 mmHg AV Peak Grad:   3.6 mmHg LVOT Vmax:      92.00 cm/s LVOT Vmean:     49.100 cm/s LVOT VTI:       0.114 m  AORTA Ao Root diam: 3.30 cm Ao Asc diam:  3.10 cm MITRAL VALVE               TRICUSPID VALVE MV Area (PHT): 5.75 cm    TV Peak grad:   35.8 mmHg MV Decel Time: 132 msec    TV Vmax:        2.99 m/s MV E velocity: 80.60 cm/s                            SHUNTS                            Systemic VTI:  0.11 m  Systemic Diam: 2.00 cm Neoma Laming Electronically signed by Neoma Laming Signature Date/Time: 11/22/2021/2:07:39 PM    Final      Echo preserved left ventricular function 60%  TELEMETRY: Atrial fibrillation rate of 100  ASSESSMENT AND PLAN:  Principal Problem:   Acute kidney injury superimposed on chronic kidney disease (HCC) Generalized weakness Congestive heart failure diastolic dysfunction chronic Chronic atrial fibrillation Obesity Hyperlipidemia Hypertension Angina Gout . Plan Agree with gentle hydration because of renal insufficiency Continue blood pressure management and control with clonidine Plendil hydralazine Lopressor Continue anticoagulation for atrial fibrillation with Eliquis Rate control for A. fib with metoprolol Reflux therapy with Pepcid Maintain Lipitor therapy for hyperlipidemia Consider physical therapy and Occupational Therapy to help with gait training and weakness Nephrology input for renal insufficiency acute on chronic    Yolonda Kida, MD,   11/23/2021 10:02 AM

## 2021-11-23 NOTE — Progress Notes (Addendum)
PROGRESS NOTE    George Bolton  DXA:[id number] DOB: July 22, 1941 DOA: 11/22/2021 PCP: Denton Lank, MD    Brief Narrative:   George Bolton is a 81 y.o. African-American male with medical history significant for chronic atrial fibrillation, chronic diastolic CHF, gout and hypertension, presented to the ER with acute onset of generalized weakness with diminished p.o. intake and abnormal labs that were checked by PCP with elevated creatinine.  Creatinine was 3.  6 3 with baseline 1.33.  Procalcitonin 5.94, chest x-ray did not show any acute changes.  Assessment & Plan:   Principal Problem:   Acute kidney injury superimposed on chronic kidney disease ([place])   Acute kidney injury superimposed on chronic kidney disease stage IIIa. Metabolic acidosis. Elevated troponin secondary to acute renal failure. Patient renal function is slightly better, metabolic acidosis is improving.  Continue lactated Ringer's solution, no evidence of volume overload. Patient was able to urinate after standing up, no urinary retention on bladder scan. Renal ultrasound did not show any obstruction, did show evidence of a chronic kidney disease. Will continue to monitor renal function. Uric acid level 8.4.  Bilateral lower lobe community-acquired pneumonia.  Ruled in. Repeat chest x-ray after fluids showed bilateral lower lobe atelectasis/infiltrates.  Patient started have some cough, but not short of breath or hypoxemia Will continue finish up 5 days antibiotics with Rocephin and Zithromax.   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 Continue allopurinol. No exacerbation.  Essential hypertension. Continue beta-blocker and clonidine.  Chronic diastolic congestive heart failure. Still no evidence of volume overload.  Continue fluids.  Hold diuretics.  Type 2 diabetes.   Continue sliding scale insulin.  Chronic atrial fibrillation with rapid ventricle response. Eliquis and beta-blocker.     DVT  prophylaxis: Eliquis Code Status: DNR Family Communication: Son updated Disposition Plan:      Status is: Inpatient   Remains inpatient appropriate because: Severity of disease, IV fluids.  Antibiotics    I/O last 3 completed shifts: In: 1124.4 [P.O.:480; I.V.:644.4] Out: 425 [Urine:425] Total I/O In: -  Out: 400 [Urine:400]     Consultants:  None  Procedures: None  Antimicrobials: Rocephin and Zithromax.  Subjective: Patient slept well last night, did not have any paroxysmal nocturnal dyspnea, no orthopnea.  No leg edema. He started have a cough today, nonproductive. No fever chills  He has significant weakness, will obtain PT/OT.  Objective: Vitals:   11/22/21 2156 11/23/21 0028 11/23/21 0402 11/23/21 0821  BP: 115/86 (!) 129/91 (!) 117/93 122/88  Pulse: (!) 109 (!) 104 (!) 103 90  Resp:  16 16 18   Temp:  98.5 F (36.9 C) 98.9 F (37.2 C) 97.9 F (36.6 C)  TempSrc:  Oral Oral Oral  SpO2:  98% 96% 96%  Weight:      Height:        Intake/Output Summary (Last 24 hours) at 11/23/2021 1030 Last data filed at 11/23/2021 1017 Gross per 24 hour  Intake 480.32 ml  Output 525 ml  Net -44.68 ml   Filed Weights   11/21/21 1800  Weight: 79.4 kg    Examination:  General exam: Appears calm and comfortable  Respiratory system: Clear to auscultation. Respiratory effort normal. Cardiovascular system: S1 & S2 heard, RRR. No JVD, murmurs, rubs, gallops or clicks. No pedal edema. Gastrointestinal system: Abdomen is nondistended, soft and nontender. No organomegaly or masses felt. Normal bowel sounds heard. Central nervous system: Alert and oriented x3. No focal neurological deficits. Extremities: Symmetric 5 x 5 power.  Skin: No rashes, lesions or ulcers Psychiatry: Judgement and insight appear normal. Mood & affect appropriate.     Data Reviewed: I have personally reviewed following labs and imaging studies  CBC: Recent Labs  Lab 11/21/21 1803 11/22/21 0515  11/23/21 0613  WBC 17.3* 12.1* 8.8  NEUTROABS  --   --  6.7  HGB 14.8 14.1 11.9*  HCT 46.0 43.2 36.9*  MCV 85.3 83.1 84.2  PLT 198 170 818   Basic Metabolic Panel: Recent Labs  Lab 11/21/21 1803 11/22/21 0515 11/23/21 0613  NA 139 136 136  K 4.6 4.6 4.8  CL 107 109 109  CO2 20* 19* 21*  GLUCOSE 125* 148* 95  BUN 57* 58* 55*  CREATININE 3.64* 3.63* 3.29*  CALCIUM 9.4 8.9 8.7*   GFR: Estimated Creatinine Clearance: 19 mL/min (A) (by C-G formula based on SCr of 3.29 mg/dL (H)). Liver Function Tests: No results for input(s): AST, ALT, ALKPHOS, BILITOT, PROT, ALBUMIN in the last 168 hours. No results for input(s): LIPASE, AMYLASE in the last 168 hours. No results for input(s): AMMONIA in the last 168 hours. Coagulation Profile: No results for input(s): INR, PROTIME in the last 168 hours. Cardiac Enzymes: No results for input(s): CKTOTAL, CKMB, CKMBINDEX, TROPONINI in the last 168 hours. BNP (last 3 results) No results for input(s): PROBNP in the last 8760 hours. HbA1C: No results for input(s): HGBA1C in the last 72 hours. CBG: Recent Labs  Lab 11/22/21 0858 11/22/21 1234 11/22/21 1652 11/22/21 2040 11/23/21 0809  GLUCAP 110* 164* 107* 144* 89   Lipid Profile: No results for input(s): CHOL, HDL, LDLCALC, TRIG, CHOLHDL, LDLDIRECT in the last 72 hours. Thyroid Function Tests: No results for input(s): TSH, T4TOTAL, FREET4, T3FREE, THYROIDAB in the last 72 hours. Anemia Panel: No results for input(s): VITAMINB12, FOLATE, FERRITIN, TIBC, IRON, RETICCTPCT in the last 72 hours. Sepsis Labs: Recent Labs  Lab 11/21/21 1803 11/21/21 2200 11/22/21 0515 11/23/21 0613  PROCALCITON  --  5.94 5.40 2.38  LATICACIDVEN 1.9  --   --   --     Recent Results (from the past 240 hour(s))  Resp Panel by RT-PCR (Flu A&B, Covid) Nasopharyngeal Swab     Status: None   Collection Time: 11/22/21  1:00 AM   Specimen: Nasopharyngeal Swab; Nasopharyngeal(NP) swabs in vial transport  medium  Result Value Ref Range Status   SARS Coronavirus 2 by RT PCR NEGATIVE NEGATIVE Final    Comment: (NOTE) SARS-CoV-2 target nucleic acids are NOT DETECTED.  The SARS-CoV-2 RNA is generally detectable in upper respiratory specimens during the acute phase of infection. The lowest concentration of SARS-CoV-2 viral copies this assay can detect is 138 copies/mL. A negative result does not preclude SARS-Cov-2 infection and should not be used as the sole basis for treatment or other patient management decisions. A negative result may occur with  improper specimen collection/handling, submission of specimen other than nasopharyngeal swab, presence of viral mutation(s) within the areas targeted by this assay, and inadequate number of viral copies(<138 copies/mL). A negative result must be combined with clinical observations, patient history, and epidemiological information. The expected result is Negative.  Fact Sheet for Patients:  EntrepreneurPulse.com.au  Fact Sheet for Healthcare Providers:  IncredibleEmployment.be  This test is no t yet approved or cleared by the [place] FDA and  has been authorized for detection and/or diagnosis of SARS-CoV-2 by FDA under an Emergency Use Authorization (EUA). This EUA will remain  in effect (meaning this test can be used) for  the duration of the COVID-19 declaration under Section 564(b)(1) of the Act, 21 U.S.C.section 360bbb-3(b)(1), unless the authorization is terminated  or revoked sooner.       Influenza A by PCR NEGATIVE NEGATIVE Final   Influenza B by PCR NEGATIVE NEGATIVE Final    Comment: (NOTE) The Xpert Xpress SARS-CoV-2/FLU/RSV plus assay is intended as an aid in the diagnosis of influenza from Nasopharyngeal swab specimens and should not be used as a sole basis for treatment. Nasal washings and aspirates are unacceptable for Xpert Xpress SARS-CoV-2/FLU/RSV testing.  Fact Sheet for  Patients: EntrepreneurPulse.com.au  Fact Sheet for Healthcare Providers: IncredibleEmployment.be  This test is not yet approved or cleared by the [place] FDA and has been authorized for detection and/or diagnosis of SARS-CoV-2 by FDA under an Emergency Use Authorization (EUA). This EUA will remain in effect (meaning this test can be used) for the duration of the COVID-19 declaration under Section 564(b)(1) of the Act, 21 U.S.C. section 360bbb-3(b)(1), unless the authorization is terminated or revoked.  Performed at [facility], [address] [place], [place] [address]          Radiology Studies: DG Chest 2 View  Result Date: 11/22/2021 CLINICAL DATA:  Weakness. EXAM: CHEST - 2 VIEW COMPARISON:  November 21, 2021. FINDINGS: Stable cardiomediastinal silhouette. Mildly increased bibasilar atelectasis or edema is noted. Bony thorax is unremarkable. IMPRESSION: Mildly increased bibasilar atelectasis or edema. Electronically Signed   By: Marijo Conception M.D.   On: 11/22/2021 12:36   DG Chest 2 View  Result Date: 11/21/2021 CLINICAL DATA:  Leukocytosis EXAM: CHEST - 2 VIEW COMPARISON:  06/28/2021 FINDINGS: Transverse diameter of heart is increased. There is poor inspiration. There are no signs of alveolar pulmonary edema or focal pulmonary consolidation. There is no pleural effusion or pneumothorax. IMPRESSION: Cardiomegaly. Poor inspiration. There are no signs of pulmonary edema or focal pulmonary consolidation. Electronically Signed   By: Elmer Picker M.D.   On: 11/21/2021 18:40   US RENAL  Result Date: 11/22/2021 CLINICAL DATA:  Renal dysfunction EXAM: RENAL / URINARY TRACT ULTRASOUND COMPLETE COMPARISON:  08/28/2020 FINDINGS: Right Kidney: Renal measurements: 9.4 x 5.3 x 4.8 cm = volume: 272.1 mL. There is increased cortical echogenicity. There is no hydronephrosis. There are multiple cysts in the right kidney largest  measuring 5.4 x 5 x 5.6 cm in the upper pole. Left Kidney: Renal measurements: 13.6 x 7.6 x 5.9 cm = volume: 317.9 mL. There is no hydronephrosis. There is increased cortical echogenicity. There are multiple cysts of varying sizes largest measuring 5.7 x 4.9 x 5.1 cm in the midportion. Overall, no significant interval changes are noted. Bladder: Appears normal for degree of bladder distention. Other: None. IMPRESSION: There is no hydronephrosis. There is increased cortical echogenicity in both kidneys suggesting medical renal disease. Bilateral renal cysts. Electronically Signed   By: Elmer Picker M.D.   On: 11/22/2021 12:42   ECHOCARDIOGRAM COMPLETE  Result Date: 11/22/2021    ECHOCARDIOGRAM REPORT   Patient Name:   LYNK MARTI Date of Exam: 11/22/2021 Medical Rec #:  [id number]       Height:       72.0 in Accession #:    [id number]      Weight:       175.0 lb Date of Birth:  14-Aug-1941       BSA:          2.013 m Patient Age:    53 years  BP:           110/91 mmHg Patient Gender: M               HR:           122 bpm. Exam Location:  [facility] Procedure: 2D Echo Indications:     Elevated Troponin  History:         Patient has prior history of Echocardiogram examinations, most                  recent 03/03/2018.  Sonographer:     Kathlen Brunswick RDCS Referring Phys:  [id number] [place] Diagnosing Phys: [place]  1. Left ventricular ejection fraction, by estimation, is 60 to 65%. The left ventricle has normal function. The left ventricle has no regional wall motion abnormalities. There is moderate concentric left ventricular hypertrophy. Left ventricular diastolic parameters are indeterminate.  2. Right ventricular systolic function is normal. The right ventricular size is normal.  3. Left atrial size was moderately dilated.  4. The mitral valve is normal in structure. No evidence of mitral valve regurgitation. No evidence of mitral stenosis.  5. The aortic valve is normal in structure.  Aortic valve regurgitation is not visualized. No aortic stenosis is present.  6. The inferior vena cava is normal in size with greater than 50% respiratory variability, suggesting right atrial pressure of 3 mmHg. FINDINGS  Left Ventricle: Left ventricular ejection fraction, by estimation, is 60 to 65%. The left ventricle has normal function. The left ventricle has no regional wall motion abnormalities. The left ventricular internal cavity size was normal in size. There is  moderate concentric left ventricular hypertrophy. Left ventricular diastolic parameters are indeterminate. Right Ventricle: The right ventricular size is normal. No increase in right ventricular wall thickness. Right ventricular systolic function is normal. Left Atrium: Left atrial size was moderately dilated. Right Atrium: Right atrial size was normal in size. Pericardium: There is no evidence of pericardial effusion. Mitral Valve: The mitral valve is normal in structure. No evidence of mitral valve regurgitation. No evidence of mitral valve stenosis. Tricuspid Valve: The tricuspid valve is normal in structure. Tricuspid valve regurgitation is not demonstrated. No evidence of tricuspid stenosis. Aortic Valve: The aortic valve is normal in structure. Aortic valve regurgitation is not visualized. No aortic stenosis is present. Aortic valve peak gradient measures 3.6 mmHg. Pulmonic Valve: The pulmonic valve was normal in structure. Pulmonic valve regurgitation is not visualized. No evidence of pulmonic stenosis. Aorta: The aortic root is normal in size and structure. Venous: The inferior vena cava is normal in size with greater than 50% respiratory variability, suggesting right atrial pressure of 3 mmHg. IAS/Shunts: No atrial level shunt detected by color flow Doppler.  LEFT VENTRICLE PLAX 2D LVIDd:         3.60 cm   Diastology LVIDs:         2.50 cm   LV e' medial:    5.66 cm/s LV PW:         1.60 cm   LV E/e' medial:  14.2 LV IVS:        1.60 cm    LV e' lateral:   11.90 cm/s LVOT diam:     2.00 cm   LV E/e' lateral: 6.8 LV SV:         36 LV SV Index:   18 LVOT Area:     3.14 cm  RIGHT VENTRICLE RV Basal diam:  3.20 cm TAPSE (M-mode):  1.7 cm LEFT ATRIUM             Index        RIGHT ATRIUM           Index LA diam:        5.40 cm 2.68 cm/m   RA Area:     19.50 cm LA Vol (A2C):   55.6 ml 27.62 ml/m  RA Volume:   59.20 ml  29.41 ml/m LA Vol (A4C):   46.2 ml 22.95 ml/m LA Biplane Vol: 51.6 ml 25.63 ml/m  AORTIC VALVE                 PULMONIC VALVE AV Area (Vmax): 3.04 cm     PV Vmax:       0.80 m/s AV Vmax:        95.00 cm/s   PV Peak grad:  2.6 mmHg AV Peak Grad:   3.6 mmHg LVOT Vmax:      92.00 cm/s LVOT Vmean:     49.100 cm/s LVOT VTI:       0.114 m  AORTA Ao Root diam: 3.30 cm Ao Asc diam:  3.10 cm MITRAL VALVE               TRICUSPID VALVE MV Area (PHT): 5.75 cm    TV Peak grad:   35.8 mmHg MV Decel Time: 132 msec    TV Vmax:        2.99 m/s MV E velocity: 80.60 cm/s                            SHUNTS                            Systemic VTI:  0.11 m                            Systemic Diam: 2.00 cm [employer] Electronically signed by Neoma Laming Signature Date/Time: 11/22/2021/2:07:39 PM    Final         Scheduled Meds:  allopurinol  50 mg Oral Daily   apixaban  2.5 mg Oral BID   atorvastatin  40 mg Oral QPM   azithromycin  500 mg Oral Daily   cloNIDine  0.1 mg Oral BID   [START ON 11/24/2021] famotidine  10 mg Oral Daily   hydrALAZINE  25 mg Oral TID   influenza vaccine adjuvanted  0.5 mL Intramuscular Tomorrow-1000   insulin aspart  0-9 Units Subcutaneous TID AC & HS   isosorbide mononitrate  30 mg Oral Daily   metoprolol tartrate  50 mg Oral BID   Continuous Infusions:  cefTRIAXone (ROCEPHIN)  IV Stopped (11/22/21 1308)   lactated ringers 125 mL/hr at 11/23/21 0624     LOS: 1 day    Time spent: 35 minutes    Sharen Hones, MD [facility]   To contact the attending provider between 7A-7P or the covering  provider during after hours 7P-7A, please log into the web site [email or web address] and access using universal  password for that web site. If you do not have the password, please call the hospital operator.  11/23/2021, 10:30 AM

## 2021-11-23 NOTE — Progress Notes (Signed)
To ultrasound in stable condition.

## 2021-11-23 NOTE — Evaluation (Signed)
Occupational Therapy Evaluation Patient Details Name: George Bolton MRN: [id number] DOB: 08-Apr-1941 Today's Date: 11/23/2021   History of Present Illness Pt is an 81 y/o F M admitted on 11/22/21 after presenting to the ED with acute onset of generalized weakness with diminished p.o. intake & abnormal labs. 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 Pt also has bilateral lower lobe CAP. PMH: chronic a-fib, chronic diastolic CHF, gout, HTN   Clinical Impression   Pt seen for OT evaluation this date. Upon arrival to room, pt sitting upright in recliner, with daughter and wife leaving pt's room to return home. Pt requested to return to bed, however agreeable to OT evaluation prior to return to bed. Prior to admission, pt was using RW for functional mobility, performing ADLs independently (sponge-bathing at baseline), and receiving assistance from family for IADLs. Pt currently presents with decreased activity tolerance, decreased strength, and decreased balance. Due to these functional impairments, pt requires MIN GUARD for seated LB dressing, MIN GUARD for standing while using urinal to urinate and for standing hand hygiene, MIN GUARD for functional mobility of short household distances (~87ft) with RW, and SUPERVISION for bed mobility. Pt would benefit from additional skilled OT services to maximize return to PLOF and minimize risk of future falls, injury, caregiver burden, and readmission. Upon discharge, recommend HHOT services (although pt will likely decline; pt stated "I will be able to do it by myself").     Recommendations for follow up therapy are one component of a multi-disciplinary discharge planning process, led by the attending physician.  Recommendations may be updated based on patient status, additional functional criteria and insurance authorization.   Follow Up Recommendations  Home health OT (although pt will likely  decline; pt stated "I will be able to do it by myself")    Assistance Recommended at Discharge Intermittent Supervision/Assistance  Patient can return home with the following A little help with walking and/or transfers;A little help with bathing/dressing/bathroom    Functional Status Assessment  Patient has had a recent decline in their functional status and demonstrates the ability to make significant improvements in function in a reasonable and predictable amount of time.  Equipment Recommendations  None recommended by OT       Precautions / Restrictions Precautions Precautions: Fall Restrictions Weight Bearing Restrictions: No      Mobility Bed Mobility Overal bed mobility: Needs Assistance Bed Mobility: Sit to Supine       Sit to supine: Supervision, HOB elevated   General bed mobility comments: Requires increased time/effort    Transfers Overall transfer level: Needs assistance Equipment used: Rolling walker (2 wheels) Transfers: Sit to/from Stand Sit to Stand: Min assist           General transfer comment: requires MIN A for steadying when upright      Balance Overall balance assessment: Needs assistance Sitting-balance support: No upper extremity supported, Feet supported Sitting balance-Leahy Scale: Good Sitting balance - Comments: Able to bend outside BOS to don/doff socks but requires significant time/effort   Standing balance support: No upper extremity supported, During functional activity Standing balance-Leahy Scale: Fair Standing balance comment: Requires MIN GUARD for standing grooming tasks                           ADL either performed or assessed with clinical judgement   ADL Overall ADL's : Needs assistance/impaired  Grooming: Wash/dry hands;Min guard;Standing               Lower Body Dressing: Min guard;Sitting/lateral leans Lower Body Dressing Details (indicate cue type and reason): to don/doff socks. Requires  increased time/effort to complete while seated in recliner     Toileting- Clothing Manipulation and Hygiene: Minimal assistance;Sit to/from stand Toileting - Clothing Manipulation Details (indicate cue type and reason): Requires MIN GUARD for standing while using urinal to urinate. Some drops of liquid noted on floor/urinal, requiring assistance from this author to clean up     Functional mobility during ADLs: Min guard;Rolling walker (2 wheels) (to walk 31ft)       Vision Patient Visual Report: No change from baseline              Pertinent Vitals/Pain Pain Assessment Pain Assessment: 0-10 Pain Score: 8  Pain Location: bottom of L foot Pain Descriptors / Indicators: Discomfort, Aching Pain Intervention(s): Limited activity within patient's tolerance, Monitored during session, Patient requesting pain meds-RN notified        Extremity/Trunk Assessment Upper Extremity Assessment Upper Extremity Assessment: Generalized weakness   Lower Extremity Assessment Lower Extremity Assessment: Generalized weakness   Cervical / Trunk Assessment Cervical / Trunk Assessment: Kyphotic   Communication Communication Communication: Other (comment) (difficult to understand at times)   Cognition Arousal/Alertness: Awake/alert Behavior During Therapy: WFL for tasks assessed/performed Overall Cognitive Status: Within Functional Limits for tasks assessed                                 General Comments: Alert and oriented to self, place, and month (stated year 2022)                Home Living Family/patient expects to be discharged to:: Private residence Living Arrangements: Spouse/significant other Available Help at Discharge: Family;Available 24 hours/day (Pt's wife is available 24/7 for supervision (limited physical assistance). Pt's son & daughter live nearby and can help PRN) Type of Home: House Home Access: Stairs to enter Technical brewer of Steps:  3 Entrance Stairs-Rails: Left Home Layout: One level     Bathroom Shower/Tub: Sponge bathes at baseline         Home Equipment: Conservation officer, nature (2 wheels);BSC/3in1          Prior Functioning/Environment Prior Level of Function : Independent/Modified Independent             Mobility Comments: Pt uses RW for functional mobiloty ADLs Comments: Pt report he is independent with ADLs at baseline. Family assists with IADLs.        OT Problem List: Decreased strength;Decreased activity tolerance;Impaired balance (sitting and/or standing);Pain      OT Treatment/Interventions: Self-care/ADL training;Therapeutic exercise;Energy conservation;DME and/or AE instruction;Therapeutic activities;Patient/family education;Balance training    OT Goals(Current goals can be found in the care plan section) Acute Rehab OT Goals Patient Stated Goal: to go home OT Goal Formulation: With patient Time For Goal Achievement: 12/07/21 Potential to Achieve Goals: Good ADL Goals Pt Will Perform Lower Body Dressing: with supervision;sitting/lateral leans Pt Will Transfer to Toilet: with supervision;ambulating;bedside commode Pt Will Perform Toileting - Clothing Manipulation and hygiene: with supervision;sitting/lateral leans  OT Frequency: Min 2X/week       AM-PAC OT "6 Clicks" Daily Activity     Outcome Measure Help from another person eating meals?: None Help from another person taking care of personal grooming?: A Little Help from another person toileting, which includes using  toliet, bedpan, or urinal?: A Little Help from another person bathing (including washing, rinsing, drying)?: A Lot Help from another person to put on and taking off regular upper body clothing?: None Help from another person to put on and taking off regular lower body clothing?: A Little 6 Click Score: 19   End of Session Equipment Utilized During Treatment: Rolling walker (2 wheels) Nurse Communication: Mobility  status;Patient requests pain meds  Activity Tolerance: Patient tolerated treatment well Patient left: in bed;with call bell/phone within reach;with bed alarm set  OT Visit Diagnosis: Unsteadiness on feet (R26.81);Muscle weakness (generalized) (M62.81);Pain Pain - Right/Left: Left Pain - part of body: Ankle and joints of foot                Time: 1447-1510 OT Time Calculation (min): 23 min Charges:  OT General Charges $OT Visit: 1 Visit OT Treatments $Self Care/Home Management : 8-22 mins  Fredirick Maudlin, OTR/L [place]

## 2021-11-23 NOTE — Evaluation (Signed)
Physical Therapy Evaluation Patient Details Name: George Bolton MRN: [id number] DOB: 11-10-1940 Today's Date: 11/23/2021  History of Present Illness  Pt is an 81 y/o F M admitted on 11/22/21 after presenting to the ED with acute onset of generalized weakness with diminished p.o. intake & abnormal labs. 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 Pt also has bilateral lower lobe CAP. PMH: chronic a-fib, chronic diastolic CHF, gout, HTN  Clinical Impression  Pt seen for PT evaluation with pt reporting prior to admission he was living with wife & ambulatory with RW. On this date, pt is able to complete supine>sit with supervision but significantly extra time. Pt requires min assist for sit<>stand but is able to ambulate out into hallway with RW & CGA. Pt presents with kyphotic posture & pushes RW slightly out in front of him. Pt also reports need to void & PT assisted him with standing with CGA with RW & pt utilized urinal. Anticipate pt can return home with HHPT.        Recommendations for follow up therapy are one component of a multi-disciplinary discharge planning process, led by the attending physician.  Recommendations may be updated based on patient status, additional functional criteria and insurance authorization.  Follow Up Recommendations Home health PT    Assistance Recommended at Discharge Frequent or constant Supervision/Assistance  Patient can return home with the following       Equipment Recommendations None recommended by PT  Recommendations for Other Services       Functional Status Assessment Patient has had a recent decline in their functional status and demonstrates the ability to make significant improvements in function in a reasonable and predictable amount of time.     Precautions / Restrictions Precautions Precautions: Fall Restrictions Weight Bearing Restrictions: No      Mobility  Bed  Mobility Overal bed mobility: Needs Assistance Bed Mobility: Supine to Sit     Supine to sit: Supervision, HOB elevated     General bed mobility comments: extra time to complete movement    Transfers Overall transfer level: Needs assistance Equipment used: Rolling walker (2 wheels) Transfers: Sit to/from Stand, Bed to chair/wheelchair/BSC Sit to Stand: Min assist   Step pivot transfers: Min assist            Ambulation/Gait Ambulation/Gait assistance: Min guard Gait Distance (Feet): 28 Feet Assistive device: Rolling walker (2 wheels) Gait Pattern/deviations: Decreased step length - right, Decreased step length - left, Decreased stride length Gait velocity: decreased     General Gait Details: Pushes RW slightly out in front of him  Stairs            Wheelchair Mobility    Modified Rankin (Stroke Patients Only)       Balance Overall balance assessment: Needs assistance Sitting-balance support: Feet supported Sitting balance-Leahy Scale: Fair Sitting balance - Comments: supervision static sitting   Standing balance support: During functional activity, Single extremity supported Standing balance-Leahy Scale: Fair Standing balance comment: CGA static standing while using urinal                             Pertinent Vitals/Pain Pain Assessment Pain Assessment: Faces Faces Pain Scale: Hurts a little bit Pain Location: low back Pain Descriptors / Indicators: Discomfort Pain Intervention(s): Monitored during session, Repositioned    Home Living Family/patient expects to be discharged to:: Private residence Living Arrangements:  Spouse/significant other Available Help at Discharge: Family;Available 24 hours/day Type of Home: House Home Access: Stairs to enter Entrance Stairs-Rails: Left Entrance Stairs-Number of Steps: 3   Home Layout: One level Home Equipment: Conservation officer, nature (2 wheels)      Prior Function               Mobility  Comments: Pt ambulatory with RW       Hand Dominance        Extremity/Trunk Assessment   Upper Extremity Assessment Upper Extremity Assessment: Generalized weakness    Lower Extremity Assessment Lower Extremity Assessment: Generalized weakness    Cervical / Trunk Assessment Cervical / Trunk Assessment: Kyphotic (forward head)  Communication   Communication:  (difficult to understand at times)  Cognition Arousal/Alertness: Awake/alert Behavior During Therapy: WFL for tasks assessed/performed Overall Cognitive Status: Within Functional Limits for tasks assessed                                          General Comments General comments (skin integrity, edema, etc.): SpO2 >/= 92% during session on room air, 117 bpm after gait    Exercises     Assessment/Plan    PT Assessment Patient needs continued PT services  PT Problem List Decreased strength;Decreased mobility;Decreased safety awareness;Decreased range of motion;Decreased coordination;Decreased activity tolerance;Decreased balance;Decreased knowledge of use of DME;Cardiopulmonary status limiting activity       PT Treatment Interventions DME instruction;Therapeutic exercise;Gait training;Balance training;Stair training;Neuromuscular re-education;Functional mobility training;Therapeutic activities;Patient/family education;Modalities    PT Goals (Current goals can be found in the Care Plan section)  Acute Rehab PT Goals Patient Stated Goal: get better PT Goal Formulation: With patient Time For Goal Achievement: 12/07/21 Potential to Achieve Goals: Good    Frequency Min 2X/week     Co-evaluation               AM-PAC PT "6 Clicks" Mobility  Outcome Measure Help needed turning from your back to your side while in a flat bed without using bedrails?: None Help needed moving from lying on your back to sitting on the side of a flat bed without using bedrails?: A Little Help needed moving to and  from a bed to a chair (including a wheelchair)?: A Little Help needed standing up from a chair using your arms (e.g., wheelchair or bedside chair)?: A Little Help needed to walk in hospital room?: A Little Help needed climbing 3-5 steps with a railing? : A Lot 6 Click Score: 18    End of Session Equipment Utilized During Treatment: Gait belt Activity Tolerance: Patient tolerated treatment well Patient left: in chair;with chair alarm set;with call bell/phone within reach Nurse Communication: Mobility status PT Visit Diagnosis: Unsteadiness on feet (R26.81);Muscle weakness (generalized) (M62.81)    Time: 3888-2800 PT Time Calculation (min) (ACUTE ONLY): 17 min   Charges:   PT Evaluation $PT Eval Low Complexity: 1 Low PT Treatments $Therapeutic Activity: 8-22 mins        Lavone Nian, PT, DPT 11/23/21, 1:28 PM   Waunita Schooner 11/23/2021, 1:24 PM

## 2021-11-23 NOTE — Consult Note (Incomplete)
CARDIOLOGY CONSULT NOTE               Patient ID: George Bolton MRN: [id number] DOB/AGE: 83-Dec-1942 83 y.o.  Admit date: 11/22/2021 Referring Physician *** Primary Physician *** Primary Cardiologist *** Reason for Consultation ***  HPI: ***  Review of systems complete and found to be negative unless listed above     Past Medical History:  Diagnosis Date   Chronic atrial fibrillation ([place])    a. Afib dates back to at least 2002 when reviewing prior EKG with EKGs from 2014 onward showing Afib; b. CHADS2VASc at least 4 (CHF, HTN, age x 2)   Chronic diastolic CHF (congestive heart failure) ([place])    a. TTE 5/19: EF 70-75%, mod LVH, near complete obliteration of the mid and apical LV cavity (can be seen in apical hypertrophic CM), mildly dilated LA, RV cavity size nl, RV wall thickness nl, RVSF mildly reduced, mildly dilated RA; b. 02/2018 Limited echo w/ definity: EF 50-65%, no apical hypertrophic CM.   Gout    Hypertension     Past Surgical History:  Procedure Laterality Date   CHOLECYSTECTOMY N/A 01/13/2017   Procedure: LAPAROSCOPIC CHOLECYSTECTOMY;  Surgeon: Jules Husbands, MD;  Location: [facility] ORS;  Service: General;  Laterality: N/A;    Medications Prior to Admission  Medication Sig Dispense Refill Last Dose   apixaban (ELIQUIS) 2.5 MG TABS tablet Take 2.5 mg by mouth 2 (two) times daily.   11/21/2021   aspirin 325 MG tablet Take 650 mg by mouth daily.   11/21/2021   atorvastatin (LIPITOR) 40 MG tablet Take 1 tablet by mouth daily.   11/21/2021   cloNIDine (CATAPRES) 0.1 MG tablet Take 0.1 mg by mouth 2 (two) times daily.   Past Week   famotidine (PEPCID) 20 MG tablet Take 1 tablet (20 mg total) by mouth 2 (two) times daily. 60 tablet 0 11/21/2021   felodipine (PLENDIL) 10 MG 24 hr tablet Take 10 mg by mouth daily.   Past Week   furosemide (LASIX) 40 MG tablet Take 1 tablet (40 mg total) by mouth daily as needed for fluid.   11/21/2021 at prn   hydrALAZINE (APRESOLINE) 25 MG  tablet Take 25 mg by mouth 3 (three) times daily.   Past Week   isosorbide mononitrate (IMDUR) 30 MG 24 hr tablet Take 30 mg by mouth daily.   Past Week   metoprolol tartrate (LOPRESSOR) 100 MG tablet Take 1 tablet (100 mg total) by mouth 2 (two) times daily. (Patient taking differently: Take 50 mg by mouth 2 (two) times daily.) 60 tablet 2 11/21/2021   allopurinol (ZYLOPRIM) 100 MG tablet Take 50 mg by mouth daily. (Patient not taking: Reported on 06/29/2021)   Not Taking   colchicine 0.6 MG tablet Take 0.6 mg by mouth daily as needed (gout flares). (Patient not taking: Reported on 11/22/2021)   Not Taking   Social History   Socioeconomic History   Marital status: Married    Spouse name: Not on file   Number of children: Not on file   Years of education: Not on file   Highest education level: Not on file  Occupational History   Not on file  Tobacco Use   Smoking status: Never   Smokeless tobacco: Never  Vaping Use   Vaping Use: Never used  Substance and Sexual Activity   Alcohol use: No   Drug use: No   Sexual activity: Not on file  Other Topics Concern   Not  on file  Social History Narrative   Not on file   Social Determinants of Health   Financial Resource Strain: Not on file  Food Insecurity: Not on file  Transportation Needs: Not on file  Physical Activity: Not on file  Stress: Not on file  Social Connections: Not on file  Intimate Partner Violence: Not on file    Family History  Problem Relation Age of Onset   Stroke Mother       Review of systems complete and found to be negative unless listed above      PHYSICAL EXAM  General: Well developed, well nourished, in no acute distress HEENT:  Normocephalic and atramatic Neck:  No JVD.  Lungs: Clear bilaterally to auscultation and percussion. Heart: HRRR . Normal S1 and S2 without gallops or murmurs.  Abdomen: Bowel sounds are positive, abdomen soft and non-tender  Msk:  Back normal, normal gait. Normal  strength and tone for age. Extremities: No clubbing, cyanosis or edema.   Neuro: Alert and oriented X 3. Psych:  Good affect, responds appropriately  Labs:   Lab Results  Component Value Date   WBC 8.8 11/23/2021   HGB 11.9 (L) 11/23/2021   HCT 36.9 (L) 11/23/2021   MCV 84.2 11/23/2021   PLT 167 11/23/2021    Recent Labs  Lab 11/23/21 0613  NA 136  K 4.8  CL 109  CO2 21*  BUN 55*  CREATININE 3.29*  CALCIUM 8.7*  GLUCOSE 95   Lab Results  Component Value Date   CKTOTAL 1,263 (H) 10/21/2013   CKMB 5.1 (H) 10/21/2013   TROPONINI 0.03 (HH) 08/15/2018   No results found for: CHOL No results found for: HDL No results found for: LDLCALC No results found for: TRIG No results found for: CHOLHDL No results found for: LDLDIRECT    Radiology: DG Chest 2 View  Result Date: 11/22/2021 CLINICAL DATA:  Weakness. EXAM: CHEST - 2 VIEW COMPARISON:  November 21, 2021. FINDINGS: Stable cardiomediastinal silhouette. Mildly increased bibasilar atelectasis or edema is noted. Bony thorax is unremarkable. IMPRESSION: Mildly increased bibasilar atelectasis or edema. Electronically Signed   By: Marijo Conception M.D.   On: 11/22/2021 12:36   DG Chest 2 View  Result Date: 11/21/2021 CLINICAL DATA:  Leukocytosis EXAM: CHEST - 2 VIEW COMPARISON:  06/28/2021 FINDINGS: Transverse diameter of heart is increased. There is poor inspiration. There are no signs of alveolar pulmonary edema or focal pulmonary consolidation. There is no pleural effusion or pneumothorax. IMPRESSION: Cardiomegaly. Poor inspiration. There are no signs of pulmonary edema or focal pulmonary consolidation. Electronically Signed   By: Elmer Picker M.D.   On: 11/21/2021 18:40   US RENAL  Result Date: 11/22/2021 CLINICAL DATA:  Renal dysfunction EXAM: RENAL / URINARY TRACT ULTRASOUND COMPLETE COMPARISON:  08/28/2020 FINDINGS: Right Kidney: Renal measurements: 9.4 x 5.3 x 4.8 cm = volume: 272.1 mL. There is increased cortical  echogenicity. There is no hydronephrosis. There are multiple cysts in the right kidney largest measuring 5.4 x 5 x 5.6 cm in the upper pole. Left Kidney: Renal measurements: 13.6 x 7.6 x 5.9 cm = volume: 317.9 mL. There is no hydronephrosis. There is increased cortical echogenicity. There are multiple cysts of varying sizes largest measuring 5.7 x 4.9 x 5.1 cm in the midportion. Overall, no significant interval changes are noted. Bladder: Appears normal for degree of bladder distention. Other: None. IMPRESSION: There is no hydronephrosis. There is increased cortical echogenicity in both kidneys suggesting medical renal disease. Bilateral  renal cysts. Electronically Signed   By: Elmer Picker M.D.   On: 11/22/2021 12:42   ECHOCARDIOGRAM COMPLETE  Result Date: 11/22/2021    ECHOCARDIOGRAM REPORT   Patient Name:   DURANT SCIBILIA Date of Exam: 11/22/2021 Medical Rec #:  [id number]       Height:       72.0 in Accession #:    [id number]      Weight:       175.0 lb Date of Birth:  1941-02-22       BSA:          2.013 m Patient Age:    26 years        BP:           110/91 mmHg Patient Gender: M               HR:           122 bpm. Exam Location:  [facility] Procedure: 2D Echo Indications:     Elevated Troponin  History:         Patient has prior history of Echocardiogram examinations, most                  recent 03/03/2018.  Sonographer:     Kathlen Brunswick RDCS Referring Phys:  [id number] [place] Diagnosing Phys: [place]  1. Left ventricular ejection fraction, by estimation, is 60 to 65%. The left ventricle has normal function. The left ventricle has no regional wall motion abnormalities. There is moderate concentric left ventricular hypertrophy. Left ventricular diastolic parameters are indeterminate.  2. Right ventricular systolic function is normal. The right ventricular size is normal.  3. Left atrial size was moderately dilated.  4. The mitral valve is normal in structure. No evidence of mitral  valve regurgitation. No evidence of mitral stenosis.  5. The aortic valve is normal in structure. Aortic valve regurgitation is not visualized. No aortic stenosis is present.  6. The inferior vena cava is normal in size with greater than 50% respiratory variability, suggesting right atrial pressure of 3 mmHg. FINDINGS  Left Ventricle: Left ventricular ejection fraction, by estimation, is 60 to 65%. The left ventricle has normal function. The left ventricle has no regional wall motion abnormalities. The left ventricular internal cavity size was normal in size. There is  moderate concentric left ventricular hypertrophy. Left ventricular diastolic parameters are indeterminate. Right Ventricle: The right ventricular size is normal. No increase in right ventricular wall thickness. Right ventricular systolic function is normal. Left Atrium: Left atrial size was moderately dilated. Right Atrium: Right atrial size was normal in size. Pericardium: There is no evidence of pericardial effusion. Mitral Valve: The mitral valve is normal in structure. No evidence of mitral valve regurgitation. No evidence of mitral valve stenosis. Tricuspid Valve: The tricuspid valve is normal in structure. Tricuspid valve regurgitation is not demonstrated. No evidence of tricuspid stenosis. Aortic Valve: The aortic valve is normal in structure. Aortic valve regurgitation is not visualized. No aortic stenosis is present. Aortic valve peak gradient measures 3.6 mmHg. Pulmonic Valve: The pulmonic valve was normal in structure. Pulmonic valve regurgitation is not visualized. No evidence of pulmonic stenosis. Aorta: The aortic root is normal in size and structure. Venous: The inferior vena cava is normal in size with greater than 50% respiratory variability, suggesting right atrial pressure of 3 mmHg. IAS/Shunts: No atrial level shunt detected by color flow Doppler.  LEFT VENTRICLE PLAX 2D LVIDd:  3.60 cm   Diastology LVIDs:         2.50 cm    LV e' medial:    5.66 cm/s LV PW:         1.60 cm   LV E/e' medial:  14.2 LV IVS:        1.60 cm   LV e' lateral:   11.90 cm/s LVOT diam:     2.00 cm   LV E/e' lateral: 6.8 LV SV:         36 LV SV Index:   18 LVOT Area:     3.14 cm  RIGHT VENTRICLE RV Basal diam:  3.20 cm TAPSE (M-mode): 1.7 cm LEFT ATRIUM             Index        RIGHT ATRIUM           Index LA diam:        5.40 cm 2.68 cm/m   RA Area:     19.50 cm LA Vol (A2C):   55.6 ml 27.62 ml/m  RA Volume:   59.20 ml  29.41 ml/m LA Vol (A4C):   46.2 ml 22.95 ml/m LA Biplane Vol: 51.6 ml 25.63 ml/m  AORTIC VALVE                 PULMONIC VALVE AV Area (Vmax): 3.04 cm     PV Vmax:       0.80 m/s AV Vmax:        95.00 cm/s   PV Peak grad:  2.6 mmHg AV Peak Grad:   3.6 mmHg LVOT Vmax:      92.00 cm/s LVOT Vmean:     49.100 cm/s LVOT VTI:       0.114 m  AORTA Ao Root diam: 3.30 cm Ao Asc diam:  3.10 cm MITRAL VALVE               TRICUSPID VALVE MV Area (PHT): 5.75 cm    TV Peak grad:   35.8 mmHg MV Decel Time: 132 msec    TV Vmax:        2.99 m/s MV E velocity: 80.60 cm/s                            SHUNTS                            Systemic VTI:  0.11 m                            Systemic Diam: 2.00 cm Neoma Laming Electronically signed by Neoma Laming Signature Date/Time: 11/22/2021/2:07:39 PM    Final     EKG: ***  ASSESSMENT AND PLAN:  ***  Signed: Yolonda Kida MD, 11/23/2021, 10:02 AM

## 2021-11-24 ENCOUNTER — Encounter: Payer: Self-pay | Admitting: Family Medicine

## 2021-11-24 DIAGNOSIS — E872 Acidosis, unspecified: Secondary | ICD-10-CM

## 2021-11-24 LAB — BASIC METABOLIC PANEL
Anion gap: 10 (ref 5–15)
BUN: 47 mg/dL — ABNORMAL HIGH (ref 8–23)
CO2: 18 mmol/L — ABNORMAL LOW (ref 22–32)
Calcium: 8.9 mg/dL (ref 8.9–10.3)
Chloride: 107 mmol/L (ref 98–111)
Creatinine, Ser: 2.97 mg/dL — ABNORMAL HIGH (ref 0.61–1.24)
GFR, Estimated: 20 mL/min — ABNORMAL LOW (ref >60)
Glucose, Bld: 105 mg/dL — ABNORMAL HIGH (ref 70–99)
Potassium: 4.7 mmol/L (ref 3.5–5.1)
Sodium: 135 mmol/L (ref 135–145)

## 2021-11-24 LAB — HEMOGLOBIN A1C
Hgb A1c MFr Bld: 6.2 % — ABNORMAL HIGH (ref 4.8–5.6)
Mean Plasma Glucose: 131 mg/dL

## 2021-11-24 LAB — GLUCOSE, CAPILLARY
Glucose-Capillary: 107 mg/dL — ABNORMAL HIGH (ref 70–99)
Glucose-Capillary: 166 mg/dL — ABNORMAL HIGH (ref 70–99)
Glucose-Capillary: 118 mg/dL — ABNORMAL HIGH (ref 70–99)
Glucose-Capillary: 134 mg/dL — ABNORMAL HIGH (ref 70–99)
Glucose-Capillary: 143 mg/dL — ABNORMAL HIGH (ref 70–99)

## 2021-11-24 MED ORDER — FUROSEMIDE 10 MG/ML IJ SOLN
20.0000 mg | Freq: Once | INTRAMUSCULAR | Status: AC
  Administered 2021-11-24: 20 mg via INTRAVENOUS
  Filled 2021-11-24: qty 2

## 2021-11-24 NOTE — Progress Notes (Signed)
[facility] Cardiology    SUBJECTIVE: Patient states to be doing reasonably well denies any pain denies any significant weakness no pain minor leg swelling   Vitals:   11/23/21 2342 11/24/21 0630 11/24/21 0645 11/24/21 0801  BP: 130/85  132/84 (!) 150/117  Pulse: 96 68  (!) 107  Resp: 18 18  16   Temp: 97.9 F (36.6 C) (!) 97.4 F (36.3 C)  98 F (36.7 C)  TempSrc: Oral Oral  Oral  SpO2: 95% 98%  (!) 88%  Weight:      Height:         Intake/Output Summary (Last 24 hours) at 11/24/2021 1205 Last data filed at 11/24/2021 1020 Gross per 24 hour  Intake 4668.77 ml  Output 700 ml  Net 3968.77 ml      PHYSICAL EXAM  General: Well developed, well nourished, in no acute distress HEENT:  Normocephalic and atramatic Neck:  No JVD.  Lungs: Clear bilaterally to auscultation and percussion. Heart: Irregular irregular. Normal S1 and S2 without gallops or murmurs.  Abdomen: Bowel sounds are positive, abdomen soft and non-tender  Msk:  Back normal, normal gait. Normal strength and tone for age. Extremities: No clubbing, cyanosis or edema.   Neuro: Alert and oriented X 3. Psych:  Good affect, responds appropriately   LABS: Basic Metabolic Panel: Recent Labs    11/23/21 0613 11/24/21 0625  NA 136 135  K 4.8 4.7  CL 109 107  CO2 21* 18*  GLUCOSE 95 105*  BUN 55* 47*  CREATININE 3.29* 2.97*  CALCIUM 8.7* 8.9   Liver Function Tests: No results for input(s): AST, ALT, ALKPHOS, BILITOT, PROT, ALBUMIN in the last 72 hours. No results for input(s): LIPASE, AMYLASE in the last 72 hours. CBC: Recent Labs    11/22/21 0515 11/23/21 0613  WBC 12.1* 8.8  NEUTROABS  --  6.7  HGB 14.1 11.9*  HCT 43.2 36.9*  MCV 83.1 84.2  PLT 170 167   Cardiac Enzymes: No results for input(s): CKTOTAL, CKMB, CKMBINDEX, TROPONINI in the last 72 hours. BNP: Invalid input(s): POCBNP D-Dimer: No results for input(s): DDIMER in the last 72 hours. Hemoglobin A1C: Recent Labs    11/22/21 0515   HGBA1C 6.2*   Fasting Lipid Panel: No results for input(s): CHOL, HDL, LDLCALC, TRIG, CHOLHDL, LDLDIRECT in the last 72 hours. Thyroid Function Tests: No results for input(s): TSH, T4TOTAL, T3FREE, THYROIDAB in the last 72 hours.  Invalid input(s): FREET3 Anemia Panel: No results for input(s): VITAMINB12, FOLATE, FERRITIN, TIBC, IRON, RETICCTPCT in the last 72 hours.  US RENAL  Result Date: 11/22/2021 CLINICAL DATA:  Renal dysfunction EXAM: RENAL / URINARY TRACT ULTRASOUND COMPLETE COMPARISON:  08/28/2020 FINDINGS: Right Kidney: Renal measurements: 9.4 x 5.3 x 4.8 cm = volume: 272.1 mL. There is increased cortical echogenicity. There is no hydronephrosis. There are multiple cysts in the right kidney largest measuring 5.4 x 5 x 5.6 cm in the upper pole. Left Kidney: Renal measurements: 13.6 x 7.6 x 5.9 cm = volume: 317.9 mL. There is no hydronephrosis. There is increased cortical echogenicity. There are multiple cysts of varying sizes largest measuring 5.7 x 4.9 x 5.1 cm in the midportion. Overall, no significant interval changes are noted. Bladder: Appears normal for degree of bladder distention. Other: None. IMPRESSION: There is no hydronephrosis. There is increased cortical echogenicity in both kidneys suggesting medical renal disease. Bilateral renal cysts. Electronically Signed   By: Elmer Picker M.D.   On: 11/22/2021 12:42   US SCROTUM W/DOPPLER  Result Date: 11/23/2021 CLINICAL DATA:  Scrotal swelling x3 months EXAM: SCROTAL ULTRASOUND DOPPLER ULTRASOUND OF THE TESTICLES TECHNIQUE: Complete ultrasound examination of the testicles, epididymis, and other scrotal structures was performed. Color and spectral Doppler ultrasound were also utilized to evaluate blood flow to the testicles. COMPARISON:  September 13, 2018 FINDINGS: Right testicle Measurements: 5.0 x 3.3 x 1.9 cm. No mass or microlithiasis visualized. Left testicle Measurements: 3.9 x 3.1 x 3.0 cm. No mass or microlithiasis  visualized. Right epididymis:  Normal in size and appearance. Left epididymis: Normal in size. Cysts in the left epididymis measuring up to 1.2 cm, containing some internal echoes. Hydrocele: Large left hydrocele containing minimal internal debris, similar to prior ultrasound from 2019. Varicocele:  None visualized. Pulsed Doppler interrogation of both testes demonstrates normal low resistance arterial and venous waveforms bilaterally. IMPRESSION: 1. Unremarkable sonographic appearance of the bilateral testicles, no evidence torsion. 2. Large left hydrocele containing some minimal internal debris and left epididymal cysts are stable dating back to September 13, 2018. Electronically Signed   By: Dahlia Bailiff M.D.   On: 11/23/2021 10:52     Echo normal left ventricular function ejection fraction of 60%  TELEMETRY: Atrial fibrillation rate of around 100 nonspecific ST-T wave changes:  ASSESSMENT AND PLAN:  Principal Problem:   Acute kidney injury superimposed on chronic kidney disease ([place]) Active Problems:   Gout   Atrial fibrillation (HCC)   Chronic diastolic congestive heart failure ([place])   Community acquired bilateral lower lobe pneumonia   Metabolic acidemia    Plan Atrial fibrillation stable continue Eliquis therapy for anticoagulation 2 acute renal insufficiency on chronic renal insufficiency recommend adequate hydration continue follow-up with nephrology 3 chronic diastolic congestive heart failure continue current medical therapy 4 broad-spectrum antibiotic therapy for pneumonia 5 borderline troponins probably demand ischemia with renal insufficiency 6 continue conservative cardiac management    Yolonda Kida, MD 11/24/2021 12:05 PM

## 2021-11-24 NOTE — Clinical Social Work Note (Signed)
Occupational Therapy * Physical Therapy * Speech Therapy          DATE ____1/30/23_______________ PATIENT NAME__[name]_______ PATIENT MRN__[id number]__________________  DIAGNOSIS/DIAGNOSIS CODE __Acute Kidney Injury N17.9_________ DATE OF DISCHARGE: ___2-3 days ___________  PRIMARY CARE PHYSICIAN __Sarah Patel___ PCP PHONE/FAX______[phone]________     Dear Provider (Name: __________________   Fax: ___________________________):   I certify that I have examined this patient and that occupational/physical/speech therapy is necessary on an outpatient basis.    The patient has expressed interest in completing their recommended course of therapy at your location.  Once a formal order from the patient's primary care physician has been obtained, please contact him/her to schedule an appointment for evaluation at your earliest convenience.   [  X]  Physical Therapy Evaluate and Treat  [  X]  Occupational Therapy Evaluate and Treat    [  ]  Speech Therapy Evaluate and Treat       The patient's primary care physician (listed above) must furnish and be responsible for a formal order such that the recommended services may be furnished while under the primary physician's care, and that the plan of care will be established and reviewed every 30 days (or more often if condition necessitates).

## 2021-11-24 NOTE — TOC Initial Note (Signed)
Transition of Care [facility]) - Initial/Assessment Note    Patient Details  Name: George Bolton MRN: [id number] Date of Birth: [birth year]  Transition of Care [facility]) CM/SW Contact:    Eileen Stanford, LCSW Phone Number: 11/24/2021, 10:04 AM  Clinical Narrative:     CSW spoke with pts son regarding HH recommendation. Per pt's son he thinks that it will be best for pt to go to outpatient therapy. Pt's son believes pt will need more equipment and to be there to get therapy. Pt's son states pt lives with spouse but that he is there a lot. Pt's son states pt has a walker at home. Pt's son states the outpatient therapy here at hospital would be best location. CSW to send referral.            Expected Discharge Plan: Home/Self Care Barriers to Discharge: Continued Medical Work up   Patient Goals and CMS Choice Patient states their goals for this hospitalization and ongoing recovery are:: for pt to go to outpatient PT per pt's son   Choice offered to / list presented to : Adult Children  Expected Discharge Plan and Services Expected Discharge Plan: Home/Self Care In-house Referral: NA   Post Acute Care Choice: NA Living arrangements for the past 2 months: Single Family Home                           HH Arranged: NA          Prior Living Arrangements/Services Living arrangements for the past 2 months: Single Family Home Lives with:: Spouse Patient language and need for interpreter reviewed:: Yes Do you feel safe going back to the place where you live?: Yes      Need for Family Participation in Patient Care: Yes (Comment) Care giver support system in place?: Yes (comment)   Criminal Activity/Legal Involvement Pertinent to Current Situation/Hospitalization: No - Comment as needed  Activities of Daily Living Home Assistive Devices/Equipment: Grab bars in shower, Grab bars around toilet, Shower chair with back, Walker (specify type) ADL Screening (condition at time of  admission) Patient's cognitive ability adequate to safely complete daily activities?: Yes Is the patient deaf or have difficulty hearing?: No Does the patient have difficulty seeing, even when wearing glasses/contacts?: No Does the patient have difficulty concentrating, remembering, or making decisions?: No Patient able to express need for assistance with ADLs?: Yes Does the patient have difficulty dressing or bathing?: Yes Independently performs ADLs?: No Communication: Independent Dressing (OT): Needs assistance Is this a change from baseline?: Change from baseline, expected to last <3days Grooming: Appropriate for developmental age Feeding: Independent Bathing: Needs assistance Is this a change from baseline?: Change from baseline, expected to last <3 days Toileting: Independent with device (comment) In/Out Bed: Needs assistance Is this a change from baseline?: Change from baseline, expected to last <3 days Walks in Home: Needs assistance Is this a change from baseline?: Change from baseline, expected to last <3 days Does the patient have difficulty walking or climbing stairs?: Yes Weakness of Legs: Both Weakness of Arms/Hands: Both  Permission Sought/Granted Permission sought to share information with : Family Supports Permission granted to share information with : Yes, Release of Information Signed  Share Information with NAME: Deandre     Permission granted to share info w Relationship: son     Emotional Assessment Appearance:: Appears stated age Attitude/Demeanor/Rapport: Unable to Assess Affect (typically observed): Unable to Assess Orientation: : Oriented to Self, Oriented  to Place Alcohol / Substance Use: Not Applicable Psych Involvement: No (comment)  Admission diagnosis:  Dehydration [E86.0] Elevated troponin [R77.8] Generalized weakness [R53.1] Acute renal failure, unspecified acute renal failure type ([place]) [N17.9] Acute kidney injury superimposed on chronic  kidney disease ([place]) [N17.9, N18.9] Leukocytosis, unspecified type [D72.829] Patient Active Problem List   Diagnosis Date Noted   Community acquired bilateral lower lobe pneumonia 80/12/4915   Metabolic acidemia 91/50/5697   Acute kidney injury superimposed on chronic kidney disease ([place]) 11/22/2021   Atrial fibrillation with RVR ([place]) 05/21/2021   CKD (chronic kidney disease), stage III ([place]) 05/21/2021   Chronic diastolic congestive heart failure ([place]) 12/01/2018   Atrial fibrillation ([place]) 03/15/2018   Lymphedema 03/15/2018   Gout 01/25/2017   CHF (congestive heart failure) ([place]) 04/25/2015   Hypertension 04/25/2015   PCP:  Denton Lank, MD Pharmacy:   [facility] [address]), [place]) [place] [address] Phone: [phone] Fax: [phone]     Social Determinants of Health (SDOH) Interventions    Readmission Risk Interventions No flowsheet data found.

## 2021-11-24 NOTE — TOC Progression Note (Signed)
Transition of Care [facility]) - Progression Note    Patient Details  Name: George Bolton MRN: [id number] Date of Birth: 02-Jun-1941  Transition of Care [facility]) CM/SW Contact  Eileen Stanford, LCSW Phone Number: 11/24/2021, 11:55 AM  Clinical Narrative:   Outpatient therapy referral faxed to [facility] location per request of son.    Expected Discharge Plan: Home/Self Care Barriers to Discharge: Continued Medical Work up  Expected Discharge Plan and Services Expected Discharge Plan: Home/Self Care In-house Referral: NA   Post Acute Care Choice: NA Living arrangements for the past 2 months: Single Family Home                           HH Arranged: NA           Social Determinants of Health (SDOH) Interventions    Readmission Risk Interventions No flowsheet data found.

## 2021-11-24 NOTE — Progress Notes (Signed)
Physical Therapy Treatment Patient Details Name: George Bolton MRN: [id number] DOB: [birth year] Today's Date: 11/24/2021   History of Present Illness Pt is an 81 y/o F M admitted on 11/22/21 after presenting to the ED with acute onset of generalized weakness with diminished p.o. intake & abnormal labs. 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 Pt also has bilateral lower lobe CAP. PMH: chronic a-fib, chronic diastolic CHF, gout, HTN    PT Comments    Pt resting in bed upon PT arrival; agreeable to PT session.  Pt requiring increased time to respond and initiate movement during sessions activities.  Pt requiring max assist to sit up on edge of bed; once sitting, pt did not initiate to assist with standing (although pt stating he was going to).  After about 10 minutes sitting edge of bed (SBA) and pt not initiating to stand (even with max cueing), pt assisted back to bed with nurse assisting.  Nurse notified of pt appearing different today compared to yesterdays PT session.  Will continue to focus on strengthening and progressive functional mobility per pt's tolerance.  PT discharge recommendations updated to SNF (TOC notified via secure message).   Recommendations for follow up therapy are one component of a multi-disciplinary discharge planning process, led by the attending physician.  Recommendations may be updated based on patient status, additional functional criteria and insurance authorization.  Follow Up Recommendations  Skilled nursing-short term rehab (<3 hours/day)     Assistance Recommended at Discharge Frequent or constant Supervision/Assistance  Patient can return home with the following Two people to help with walking and/or transfers;Assistance with cooking/housework;Direct supervision/assist for medications management;Assist for transportation;Two people to help with bathing/dressing/bathroom;Direct  supervision/assist for financial management;Help with stairs or ramp for entrance;Assistance with feeding   Equipment Recommendations  Rolling walker (2 wheels);BSC/3in1;Wheelchair (measurements PT);Wheelchair cushion (measurements PT);Hospital bed    Recommendations for Other Services       Precautions / Restrictions Precautions Precautions: Fall Restrictions Weight Bearing Restrictions: No     Mobility  Bed Mobility Overal bed mobility: Needs Assistance Bed Mobility: Supine to Sit, Sit to Supine, Rolling Rolling: Mod assist   Supine to sit: Max assist (assist for trunk and to scoot towards edge of bed) Sit to supine: +2 for physical assistance (2 assist to lay pt down in bed (for safety))   General bed mobility comments: vc's for technique    Transfers                   General transfer comment: pt did not initiate to assist with standing so eventually deferred    Ambulation/Gait                   Stairs             Wheelchair Mobility    Modified Rankin (Stroke Patients Only)       Balance Overall balance assessment: Needs assistance Sitting-balance support: No upper extremity supported, Feet supported Sitting balance-Leahy Scale: Fair Sitting balance - Comments: steady static sitting                                    Cognition Arousal/Alertness: Awake/alert Behavior During Therapy: WFL for tasks assessed/performed Overall Cognitive Status: Within Functional Limits for tasks assessed  General Comments: Pt at least oriented to self but not year of birth; pt did not answer rest of cognition questions        Exercises      General Comments  Nursing cleared pt for participation in physical therapy.  Pt agreeable to PT session.       Pertinent Vitals/Pain Pain Assessment Pain Assessment: Faces Faces Pain Scale: No hurt Pain Intervention(s): Limited activity within  patient's tolerance, Monitored during session, Repositioned Vitals (HR and O2 on room air) stable and WFL throughout treatment session.    Home Living                          Prior Function            PT Goals (current goals can now be found in the care plan section) Acute Rehab PT Goals Patient Stated Goal: get better PT Goal Formulation: With patient Time For Goal Achievement: 12/07/21 Potential to Achieve Goals: Fair Progress towards PT goals: Not progressing toward goals - comment (limited ability to follow cues to participate today)    Frequency    Min 2X/week      PT Plan Discharge plan needs to be updated (TOC notified)    Co-evaluation              AM-PAC PT "6 Clicks" Mobility   Outcome Measure  Help needed turning from your back to your side while in a flat bed without using bedrails?: A Lot Help needed moving from lying on your back to sitting on the side of a flat bed without using bedrails?: A Lot Help needed moving to and from a bed to a chair (including a wheelchair)?: Total Help needed standing up from a chair using your arms (e.g., wheelchair or bedside chair)?: Total Help needed to walk in hospital room?: Total Help needed climbing 3-5 steps with a railing? : Total 6 Click Score: 8    End of Session Equipment Utilized During Treatment: Gait belt Activity Tolerance: Patient limited by fatigue Patient left: in bed;with call bell/phone within reach;with bed alarm set Nurse Communication: Mobility status;Precautions;Other (comment) (pt's current status) PT Visit Diagnosis: Unsteadiness on feet (R26.81);Muscle weakness (generalized) (M62.81)     Time: 5883-2549 PT Time Calculation (min) (ACUTE ONLY): 24 min  Charges:  $Therapeutic Activity: 23-37 mins                    Leitha Bleak, PT 11/24/21, 4:50 PM

## 2021-11-24 NOTE — Progress Notes (Signed)
PROGRESS NOTE    George Bolton  OIZ:[id number] DOB: 01/04/1941 DOA: 11/22/2021 PCP: Denton Lank, MD    Brief Narrative:  George Bolton is a 81 y.o. African-American male with medical history significant for chronic atrial fibrillation, chronic diastolic CHF, gout and hypertension, presented to the ER with acute onset of generalized weakness with diminished p.o. intake and abnormal labs that were checked by PCP with elevated creatinine.  Creatinine was 3.  6 3 with baseline 1.33.  IV fluids were started.  Procalcitonin 5.94, chest x-ray did not show any acute changes.  Repeated chest x-ray after rehydration showed bilateral lower lobe infiltrates consistent with pneumonia.  Antibiotics were started with Rocephin and Zithromax.   Assessment & Plan:   Principal Problem:   Acute kidney injury superimposed on chronic kidney disease ([place]) Active Problems:   Gout   Atrial fibrillation (HCC)   Chronic diastolic congestive heart failure ([place])   Community acquired bilateral lower lobe pneumonia   Metabolic acidemia  Acute kidney injury superimposed on chronic kidney disease stage IIIa. Metabolic acidosis. Elevated troponin secondary to acute renal failure. Patient renal function is better today, but he developed a more short of breath today and last night.  Will discontinue fluids, give a dose of IV Lasix.  Recheck BMP tomorrow. Reviewed echocardiogram, ejection fraction was normal, no regional abnormality. Consult from nephrology will be obtained.  Bilateral lower lobe community-acquired pneumonia.  Ruled in. Continue Rocephin and Zithromax.   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 Continue allopurinol. No exacerbation.  Essential hypertension. Continue beta-blocker and clonidine.  Chronic diastolic congestive heart failure. Still no evidence of volume overload.  Continue fluids.  Hold diuretics.  Type 2 diabetes.   Continue sliding scale insulin.  Chronic atrial  fibrillation with rapid ventricle response. Eliquis and beta-blocker.     DVT prophylaxis: Eliquis Code Status: DNR Family Communication: Daughter updated at the bedside, all questions answered. Disposition Plan:      Status is: Inpatient   Remains inpatient appropriate because: Severity of disease, IV fluids.  Antibiotics     I/O last 3 completed shifts: In: 4788.8 [P.O.:480; I.V.:4108.8; IV Piggyback:200] Out: 1225 [Urine:1225] Total I/O In: 120 [P.O.:120] Out: -      Consultants:  Nephrology.  Procedures: None  Antimicrobials: Rocephin and Zithromax.  Subjective: Patient starting having short of breath last night, also short of breath today with exertion.  Still cough, nonproductive. He has been making large amount of urine since giving fluids. Denies any dysuria hematuria No fever or chills.  Objective: Vitals:   11/23/21 2342 11/24/21 0630 11/24/21 0645 11/24/21 0801  BP: 130/85  132/84 (!) 150/117  Pulse: 96 68  (!) 107  Resp: 18 18  16   Temp: 97.9 F (36.6 C) (!) 97.4 F (36.3 C)  98 F (36.7 C)  TempSrc: Oral Oral  Oral  SpO2: 95% 98%  (!) 88%  Weight:      Height:        Intake/Output Summary (Last 24 hours) at 11/24/2021 1041 Last data filed at 11/24/2021 1020 Gross per 24 hour  Intake 4668.77 ml  Output 700 ml  Net 3968.77 ml   Filed Weights   11/21/21 1800  Weight: 79.4 kg    Examination:  General exam: Appears calm and comfortable  Respiratory system: Significant decreased breathing sounds. Respiratory effort normal. Cardiovascular system: S1 & S2 heard, RRR. No JVD, murmurs, rubs, gallops or clicks. No pedal edema. Gastrointestinal system: Abdomen is nondistended, soft and nontender. No  organomegaly or masses felt. Normal bowel sounds heard. Central nervous system: Alert and oriented. No focal neurological deficits. Extremities: Symmetric 5 x 5 power. Skin: No rashes, lesions or ulcers Psychiatry: Judgement and insight appear  normal. Mood & affect appropriate.     Data Reviewed: I have personally reviewed following labs and imaging studies  CBC: Recent Labs  Lab 11/21/21 1803 11/22/21 0515 11/23/21 0613  WBC 17.3* 12.1* 8.8  NEUTROABS  --   --  6.7  HGB 14.8 14.1 11.9*  HCT 46.0 43.2 36.9*  MCV 85.3 83.1 84.2  PLT 198 170 657   Basic Metabolic Panel: Recent Labs  Lab 11/21/21 1803 11/22/21 0515 11/23/21 0613 11/24/21 0625  NA 139 136 136 135  K 4.6 4.6 4.8 4.7  CL 107 109 109 107  CO2 20* 19* 21* 18*  GLUCOSE 125* 148* 95 105*  BUN 57* 58* 55* 47*  CREATININE 3.64* 3.63* 3.29* 2.97*  CALCIUM 9.4 8.9 8.7* 8.9   GFR: Estimated Creatinine Clearance: 21 mL/min (A) (by C-G formula based on SCr of 2.97 mg/dL (H)). Liver Function Tests: No results for input(s): AST, ALT, ALKPHOS, BILITOT, PROT, ALBUMIN in the last 168 hours. No results for input(s): LIPASE, AMYLASE in the last 168 hours. No results for input(s): AMMONIA in the last 168 hours. Coagulation Profile: No results for input(s): INR, PROTIME in the last 168 hours. Cardiac Enzymes: No results for input(s): CKTOTAL, CKMB, CKMBINDEX, TROPONINI in the last 168 hours. BNP (last 3 results) No results for input(s): PROBNP in the last 8760 hours. HbA1C: Recent Labs    11/22/21 0515  HGBA1C 6.2*   CBG: Recent Labs  Lab 11/23/21 0809 11/23/21 1207 11/23/21 1802 11/23/21 2124 11/24/21 0848  GLUCAP 89 136* 120* 102* 107*   Lipid Profile: No results for input(s): CHOL, HDL, LDLCALC, TRIG, CHOLHDL, LDLDIRECT in the last 72 hours. Thyroid Function Tests: No results for input(s): TSH, T4TOTAL, FREET4, T3FREE, THYROIDAB in the last 72 hours. Anemia Panel: No results for input(s): VITAMINB12, FOLATE, FERRITIN, TIBC, IRON, RETICCTPCT in the last 72 hours. Sepsis Labs: Recent Labs  Lab 11/21/21 1803 11/21/21 2200 11/22/21 0515 11/23/21 0613  PROCALCITON  --  5.94 5.40 2.38  LATICACIDVEN 1.9  --   --   --     Recent Results  (from the past 240 hour(s))  Resp Panel by RT-PCR (Flu A&B, Covid) Nasopharyngeal Swab     Status: None   Collection Time: 11/22/21  1:00 AM   Specimen: Nasopharyngeal Swab; Nasopharyngeal(NP) swabs in vial transport medium  Result Value Ref Range Status   SARS Coronavirus 2 by RT PCR NEGATIVE NEGATIVE Final    Comment: (NOTE) SARS-CoV-2 target nucleic acids are NOT DETECTED.  The SARS-CoV-2 RNA is generally detectable in upper respiratory specimens during the acute phase of infection. The lowest concentration of SARS-CoV-2 viral copies this assay can detect is 138 copies/mL. A negative result does not preclude SARS-Cov-2 infection and should not be used as the sole basis for treatment or other patient management decisions. A negative result may occur with  improper specimen collection/handling, submission of specimen other than nasopharyngeal swab, presence of viral mutation(s) within the areas targeted by this assay, and inadequate number of viral copies(<138 copies/mL). A negative result must be combined with clinical observations, patient history, and epidemiological information. The expected result is Negative.  Fact Sheet for Patients:  EntrepreneurPulse.com.au  Fact Sheet for Healthcare Providers:  IncredibleEmployment.be  This test is no t yet approved or cleared by the  [place] Architectural technologist and  has been authorized for detection and/or diagnosis of SARS-CoV-2 by FDA under an Print production planner (EUA). This EUA will remain  in effect (meaning this test can be used) for the duration of the COVID-19 declaration under Section 564(b)(1) of the Act, 21 U.S.C.section 360bbb-3(b)(1), unless the authorization is terminated  or revoked sooner.       Influenza A by PCR NEGATIVE NEGATIVE Final   Influenza B by PCR NEGATIVE NEGATIVE Final    Comment: (NOTE) The Xpert Xpress SARS-CoV-2/FLU/RSV plus assay is intended as an aid in the  diagnosis of influenza from Nasopharyngeal swab specimens and should not be used as a sole basis for treatment. Nasal washings and aspirates are unacceptable for Xpert Xpress SARS-CoV-2/FLU/RSV testing.  Fact Sheet for Patients: EntrepreneurPulse.com.au  Fact Sheet for Healthcare Providers: IncredibleEmployment.be  This test is not yet approved or cleared by the [place] FDA and has been authorized for detection and/or diagnosis of SARS-CoV-2 by FDA under an Emergency Use Authorization (EUA). This EUA will remain in effect (meaning this test can be used) for the duration of the COVID-19 declaration under Section 564(b)(1) of the Act, 21 U.S.C. section 360bbb-3(b)(1), unless the authorization is terminated or revoked.  Performed at [facility], [address] [place], [place] [address]          Radiology Studies: DG Chest 2 View  Result Date: 11/22/2021 CLINICAL DATA:  Weakness. EXAM: CHEST - 2 VIEW COMPARISON:  November 21, 2021. FINDINGS: Stable cardiomediastinal silhouette. Mildly increased bibasilar atelectasis or edema is noted. Bony thorax is unremarkable. IMPRESSION: Mildly increased bibasilar atelectasis or edema. Electronically Signed   By: Marijo Conception M.D.   On: 11/22/2021 12:36   US RENAL  Result Date: 11/22/2021 CLINICAL DATA:  Renal dysfunction EXAM: RENAL / URINARY TRACT ULTRASOUND COMPLETE COMPARISON:  08/28/2020 FINDINGS: Right Kidney: Renal measurements: 9.4 x 5.3 x 4.8 cm = volume: 272.1 mL. There is increased cortical echogenicity. There is no hydronephrosis. There are multiple cysts in the right kidney largest measuring 5.4 x 5 x 5.6 cm in the upper pole. Left Kidney: Renal measurements: 13.6 x 7.6 x 5.9 cm = volume: 317.9 mL. There is no hydronephrosis. There is increased cortical echogenicity. There are multiple cysts of varying sizes largest measuring 5.7 x 4.9 x 5.1 cm in the midportion. Overall, no  significant interval changes are noted. Bladder: Appears normal for degree of bladder distention. Other: None. IMPRESSION: There is no hydronephrosis. There is increased cortical echogenicity in both kidneys suggesting medical renal disease. Bilateral renal cysts. Electronically Signed   By: Elmer Picker M.D.   On: 11/22/2021 12:42   ECHOCARDIOGRAM COMPLETE  Result Date: 11/22/2021    ECHOCARDIOGRAM REPORT   Patient Name:   OMARRI EICH Date of Exam: 11/22/2021 Medical Rec #:  [id number]       Height:       72.0 in Accession #:    [id number]      Weight:       175.0 lb Date of Birth:  November 17, 1940       BSA:          2.013 m Patient Age:    46 years        BP:           110/91 mmHg Patient Gender: M               HR:           122  bpm. Exam Location:  [facility] Procedure: 2D Echo Indications:     Elevated Troponin  History:         Patient has prior history of Echocardiogram examinations, most                  recent 03/03/2018.  Sonographer:     Kathlen Brunswick RDCS Referring Phys:  [id number] [place] Diagnosing Phys: [place]  1. Left ventricular ejection fraction, by estimation, is 60 to 65%. The left ventricle has normal function. The left ventricle has no regional wall motion abnormalities. There is moderate concentric left ventricular hypertrophy. Left ventricular diastolic parameters are indeterminate.  2. Right ventricular systolic function is normal. The right ventricular size is normal.  3. Left atrial size was moderately dilated.  4. The mitral valve is normal in structure. No evidence of mitral valve regurgitation. No evidence of mitral stenosis.  5. The aortic valve is normal in structure. Aortic valve regurgitation is not visualized. No aortic stenosis is present.  6. The inferior vena cava is normal in size with greater than 50% respiratory variability, suggesting right atrial pressure of 3 mmHg. FINDINGS  Left Ventricle: Left ventricular ejection fraction, by estimation, is 60 to  65%. The left ventricle has normal function. The left ventricle has no regional wall motion abnormalities. The left ventricular internal cavity size was normal in size. There is  moderate concentric left ventricular hypertrophy. Left ventricular diastolic parameters are indeterminate. Right Ventricle: The right ventricular size is normal. No increase in right ventricular wall thickness. Right ventricular systolic function is normal. Left Atrium: Left atrial size was moderately dilated. Right Atrium: Right atrial size was normal in size. Pericardium: There is no evidence of pericardial effusion. Mitral Valve: The mitral valve is normal in structure. No evidence of mitral valve regurgitation. No evidence of mitral valve stenosis. Tricuspid Valve: The tricuspid valve is normal in structure. Tricuspid valve regurgitation is not demonstrated. No evidence of tricuspid stenosis. Aortic Valve: The aortic valve is normal in structure. Aortic valve regurgitation is not visualized. No aortic stenosis is present. Aortic valve peak gradient measures 3.6 mmHg. Pulmonic Valve: The pulmonic valve was normal in structure. Pulmonic valve regurgitation is not visualized. No evidence of pulmonic stenosis. Aorta: The aortic root is normal in size and structure. Venous: The inferior vena cava is normal in size with greater than 50% respiratory variability, suggesting right atrial pressure of 3 mmHg. IAS/Shunts: No atrial level shunt detected by color flow Doppler.  LEFT VENTRICLE PLAX 2D LVIDd:         3.60 cm   Diastology LVIDs:         2.50 cm   LV e' medial:    5.66 cm/s LV PW:         1.60 cm   LV E/e' medial:  14.2 LV IVS:        1.60 cm   LV e' lateral:   11.90 cm/s LVOT diam:     2.00 cm   LV E/e' lateral: 6.8 LV SV:         36 LV SV Index:   18 LVOT Area:     3.14 cm  RIGHT VENTRICLE RV Basal diam:  3.20 cm TAPSE (M-mode): 1.7 cm LEFT ATRIUM             Index        RIGHT ATRIUM           Index LA diam:  5.40 cm 2.68 cm/m    RA Area:     19.50 cm LA Vol (A2C):   55.6 ml 27.62 ml/m  RA Volume:   59.20 ml  29.41 ml/m LA Vol (A4C):   46.2 ml 22.95 ml/m LA Biplane Vol: 51.6 ml 25.63 ml/m  AORTIC VALVE                 PULMONIC VALVE AV Area (Vmax): 3.04 cm     PV Vmax:       0.80 m/s AV Vmax:        95.00 cm/s   PV Peak grad:  2.6 mmHg AV Peak Grad:   3.6 mmHg LVOT Vmax:      92.00 cm/s LVOT Vmean:     49.100 cm/s LVOT VTI:       0.114 m  AORTA Ao Root diam: 3.30 cm Ao Asc diam:  3.10 cm MITRAL VALVE               TRICUSPID VALVE MV Area (PHT): 5.75 cm    TV Peak grad:   35.8 mmHg MV Decel Time: 132 msec    TV Vmax:        2.99 m/s MV E velocity: 80.60 cm/s                            SHUNTS                            Systemic VTI:  0.11 m                            Systemic Diam: 2.00 cm Neoma Laming Electronically signed by Neoma Laming Signature Date/Time: 11/22/2021/2:07:39 PM    Final    US SCROTUM W/DOPPLER  Result Date: 11/23/2021 CLINICAL DATA:  Scrotal swelling x3 months EXAM: SCROTAL ULTRASOUND DOPPLER ULTRASOUND OF THE TESTICLES TECHNIQUE: Complete ultrasound examination of the testicles, epididymis, and other scrotal structures was performed. Color and spectral Doppler ultrasound were also utilized to evaluate blood flow to the testicles. COMPARISON:  September 13, 2018 FINDINGS: Right testicle Measurements: 5.0 x 3.3 x 1.9 cm. No mass or microlithiasis visualized. Left testicle Measurements: 3.9 x 3.1 x 3.0 cm. No mass or microlithiasis visualized. Right epididymis:  Normal in size and appearance. Left epididymis: Normal in size. Cysts in the left epididymis measuring up to 1.2 cm, containing some internal echoes. Hydrocele: Large left hydrocele containing minimal internal debris, similar to prior ultrasound from 2019. Varicocele:  None visualized. Pulsed Doppler interrogation of both testes demonstrates normal low resistance arterial and venous waveforms bilaterally. IMPRESSION: 1. Unremarkable sonographic appearance  of the bilateral testicles, no evidence torsion. 2. Large left hydrocele containing some minimal internal debris and left epididymal cysts are stable dating back to September 13, 2018. Electronically Signed   By: Dahlia Bailiff M.D.   On: 11/23/2021 10:52        Scheduled Meds:  allopurinol  50 mg Oral Daily   apixaban  2.5 mg Oral BID   atorvastatin  40 mg Oral QPM   azithromycin  500 mg Oral Daily   cloNIDine  0.1 mg Oral BID   famotidine  10 mg Oral Daily   hydrALAZINE  25 mg Oral TID   insulin aspart  0-9 Units Subcutaneous TID AC & HS   isosorbide mononitrate  30 mg Oral Daily  metoprolol tartrate  50 mg Oral BID   Continuous Infusions:  cefTRIAXone (ROCEPHIN)  IV 200 mL/hr at 11/23/21 2142     LOS: 2 days    Time spent: 35 minutes, more than 50% time involved in direct patient care.    Sharen Hones, MD [facility]   To contact the attending provider between 7A-7P or the covering provider during after hours 7P-7A, please log into the web site [email or web address] and access using universal [place] password for that web site. If you do not have the password, please call the hospital operator.  11/24/2021, 10:41 AM

## 2021-11-25 ENCOUNTER — Inpatient Hospital Stay: Payer: Medicare Other

## 2021-11-25 DIAGNOSIS — R531 Weakness: Secondary | ICD-10-CM

## 2021-11-25 LAB — BASIC METABOLIC PANEL
Anion gap: 9 (ref 5–15)
BUN: 42 mg/dL — ABNORMAL HIGH (ref 8–23)
CO2: 21 mmol/L — ABNORMAL LOW (ref 22–32)
Calcium: 8.7 mg/dL — ABNORMAL LOW (ref 8.9–10.3)
Chloride: 107 mmol/L (ref 98–111)
Creatinine, Ser: 3.02 mg/dL — ABNORMAL HIGH (ref 0.61–1.24)
GFR, Estimated: 20 mL/min — ABNORMAL LOW (ref >60)
Glucose, Bld: 103 mg/dL — ABNORMAL HIGH (ref 70–99)
Potassium: 4.1 mmol/L (ref 3.5–5.1)
Sodium: 137 mmol/L (ref 135–145)

## 2021-11-25 LAB — MAGNESIUM: Magnesium: 2.2 mg/dL (ref 1.7–2.4)

## 2021-11-25 LAB — GLUCOSE, CAPILLARY
Glucose-Capillary: 111 mg/dL — ABNORMAL HIGH (ref 70–99)
Glucose-Capillary: 130 mg/dL — ABNORMAL HIGH (ref 70–99)
Glucose-Capillary: 141 mg/dL — ABNORMAL HIGH (ref 70–99)
Glucose-Capillary: 88 mg/dL (ref 70–99)

## 2021-11-25 IMAGING — MR MR HEAD W/O CM
7 series · 48 of 48 positions shown · non-contrast
Comparison: No prior MRI correlation is made with CT head
[DATE]

CLINICAL DATA: Left-sided weakness, stroke suspected

EXAM:
MRI HEAD WITHOUT CONTRAST
TECHNIQUE: Multiplanar, multiecho pulse sequences of the brain and surrounding
structures were obtained without intravenous contrast.

[Series 9: ax dwi_tracew · axial · 3.0mm · 1.80mm/px · z∈[-91,+64]mm · 6 of 48 slices shown]
[im 1/48]
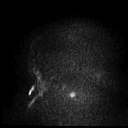
[im 10/48]
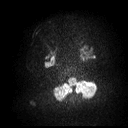
[im 19/48]
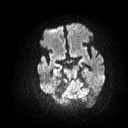
[im 29/48]
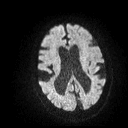
[im 38/48]
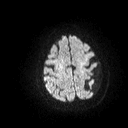
[im 48/48]
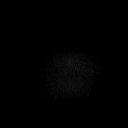

[Series 10: ax dwi_adc · axial · 3.0mm · 1.80mm/px · z∈[-91,+54]mm · 7 of 44 slices shown]
[im 1/44]
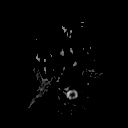
[im 8/44]
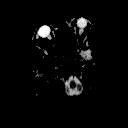
[im 15/44]
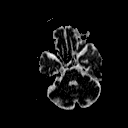
[im 22/44]
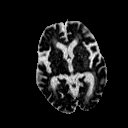
[im 29/44]
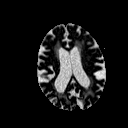
[im 36/44]
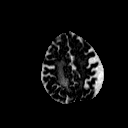
[im 44/44]
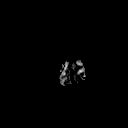

[Series 11: cor dwi_tracew · coronal · 5.0mm · 1.80mm/px · 6 of 38 slices shown]
[im 1/38]
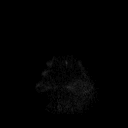
[im 8/38]
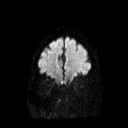
[im 15/38]
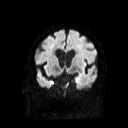
[im 23/38]
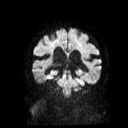
[im 30/38]
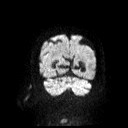
[im 38/38]
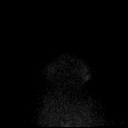

[Series 12: cor dwi_adc · coronal · 5.0mm · 1.80mm/px · 6 of 37 slices shown]
[im 1/37]
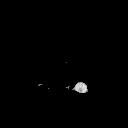
[im 8/37]
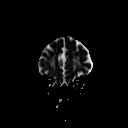
[im 15/37]
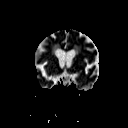
[im 22/37]
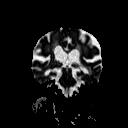
[im 29/37]
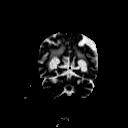
[im 37/37]
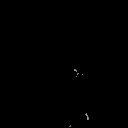

[Series 13: FLAIR · axial · 3.0mm · 0.69mm/px · z∈[-95,+67]mm · 8 of 55 slices shown]
[im 1/55]
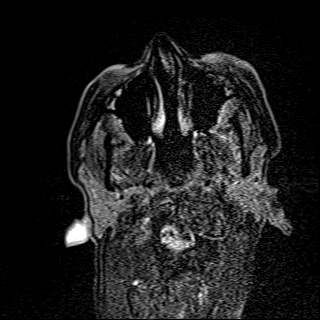
[im 8/55]
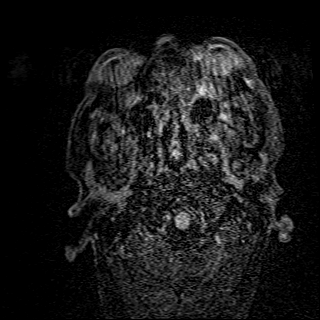
[im 16/55]
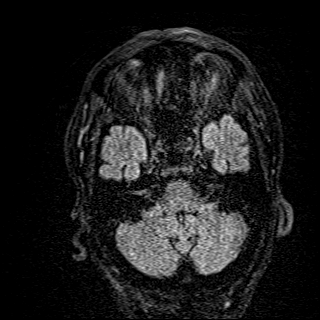
[im 24/55]
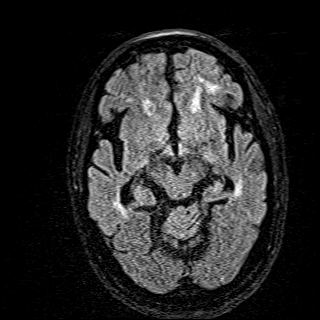
[im 31/55]
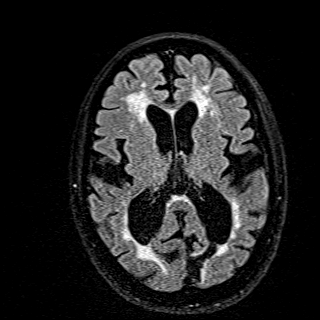
[im 39/55]
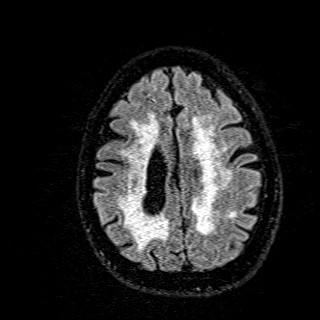
[im 47/55]
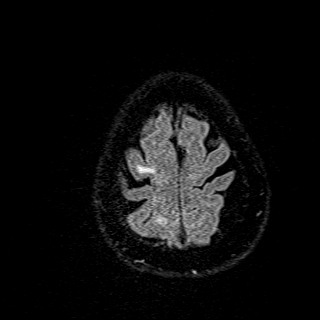
[im 55/55]
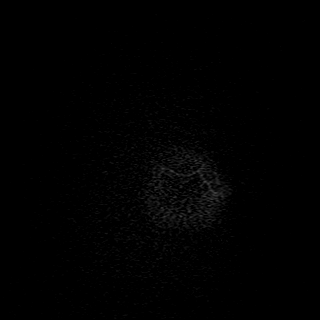

[Series 15: pha_images · axial · 3.0mm · 0.90mm/px · z∈[-78,+57]mm · 7 of 46 slices shown]
[im 1/46]
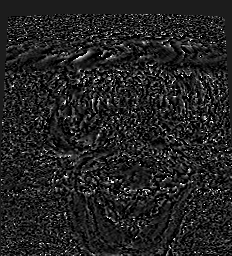
[im 8/46]
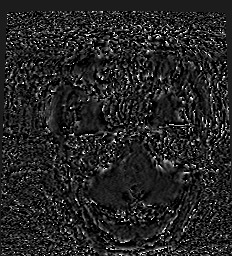
[im 16/46]
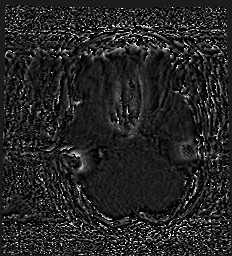
[im 23/46]
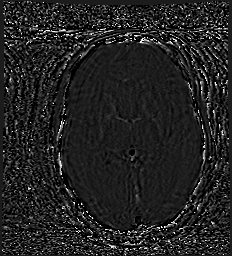
[im 31/46]
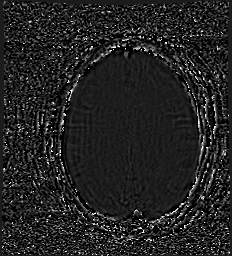
[im 38/46]
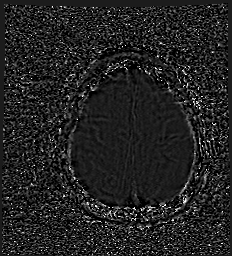
[im 46/46]
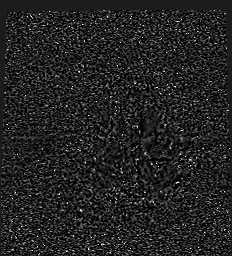

[Series 16: swi_images · axial · 3.0mm · 0.90mm/px · z∈[-90,+63]mm · 8 of 52 slices shown]
[im 1/52]
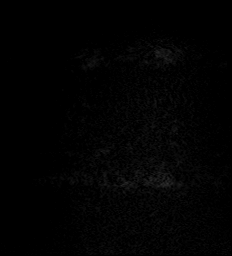
[im 8/52]
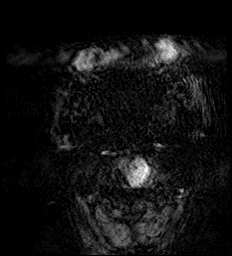
[im 15/52]
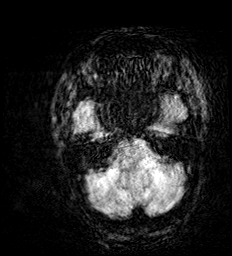
[im 22/52]
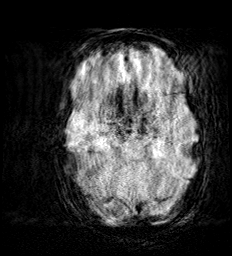
[im 30/52]
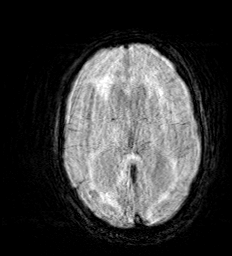
[im 37/52]
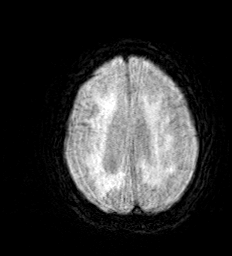
[im 44/52]
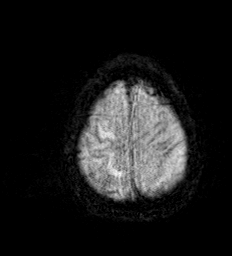
[im 52/52]
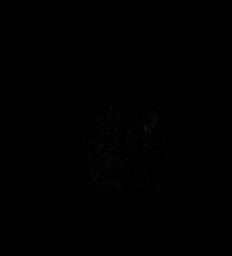

[48 of 48 positions shown; findings below may reference images not displayed]

FINDINGS: Evaluation is somewhat limited by motion and patient inability to
cooperate, with a truncated exam obtained. Only axial and coronal
diffusion-weighted sequences, axial FLAIR, and susceptibility
weighted images were obtained.

Within this limitation, restricted diffusion with ADC correlate in
the left frontal lobe (series 9, images 32-35). No other restricted
diffusion. No acute hemorrhage, mass, mass effect, or midline shift.
Ventriculomegaly, which appears in proportion to sulcal size, likely
reflecting greater than expected cerebral volume loss for age.
Confluent T2 hyperintense signal in the periventricular white
matter, likely the sequela of severe chronic small vessel ischemic
disease. Sequela of remote left occipital infarct. Lacunar infarcts
in the bilateral cerebellar hemispheres. No extra-axial collection.

No significant sinus mucosal thickening or fluid in the mastoid air
cells. Evaluation the orbits is limited by motion. No diffusion
restricting calvarial lesion.
IMPRESSION: Evaluation is somewhat limited by motion and patient inability to
cooperate with the exam, with only certain sequences obtained.

Within this limitation, there is an acute infarct in the left
frontal lobe without evidence of hemorrhagic transformation. No
significant mass effect or midline shift.

These results will be called to the ordering clinician or
representative by the Radiologist Assistant, and communication
documented in the PACS or [REDACTED].

## 2021-11-25 IMAGING — CT CT HEAD W/O CM
4 of 8 series · 13 of 47 positions shown, 14 images · non-contrast
Comparison: [DATE].

CLINICAL DATA: Generalized weakness.



[Series 3: ax head wo · axial · 0.32mm/px · z∈[+303,+387]mm · 3 of 35 slices shown, 4 images]
[im 9/35  brain]
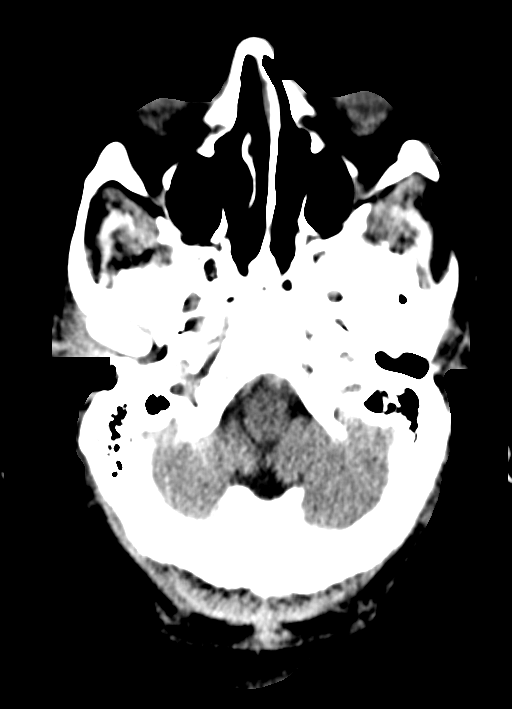
[im 9/35  bone]
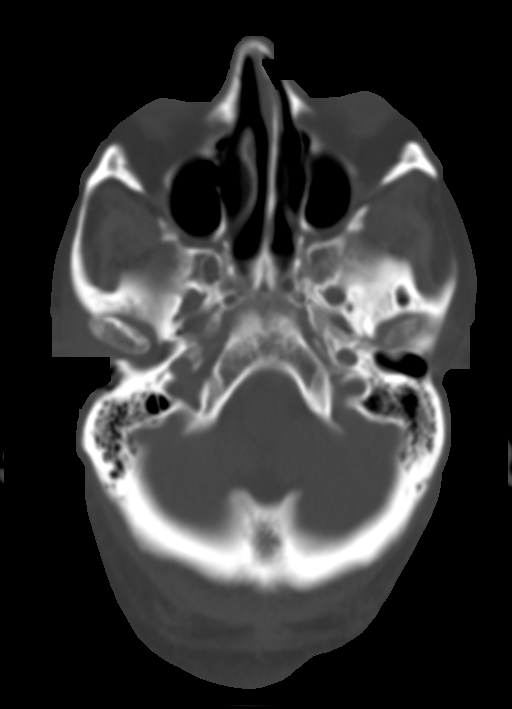
[im 18/35  brain]
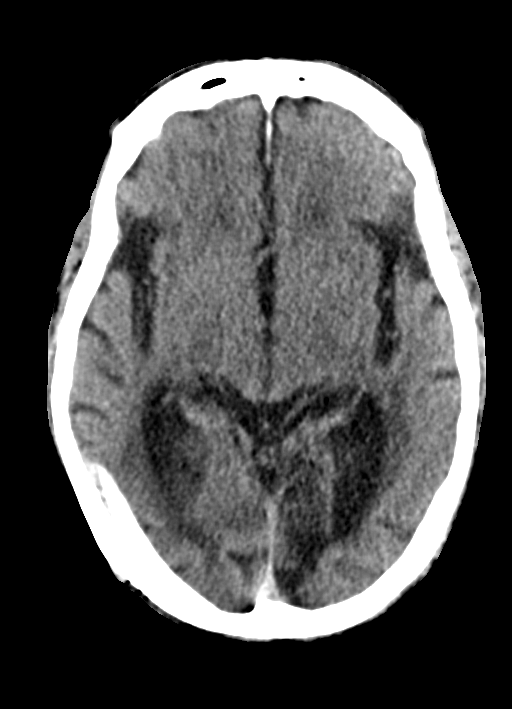
[im 26/35  brain]
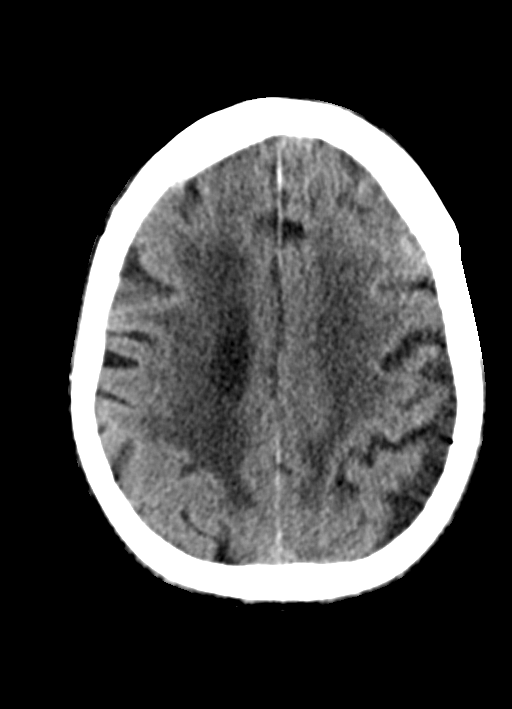

[Series 4: ax bone wo · axial · 0.32mm/px · z∈[+276,+360]mm · 5 of 86 slices shown]
[im 8/86  bone]
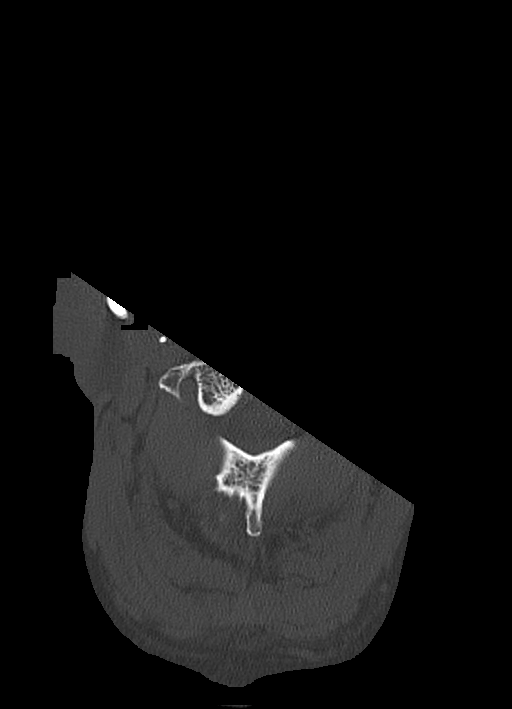
[im 22/86  bone]
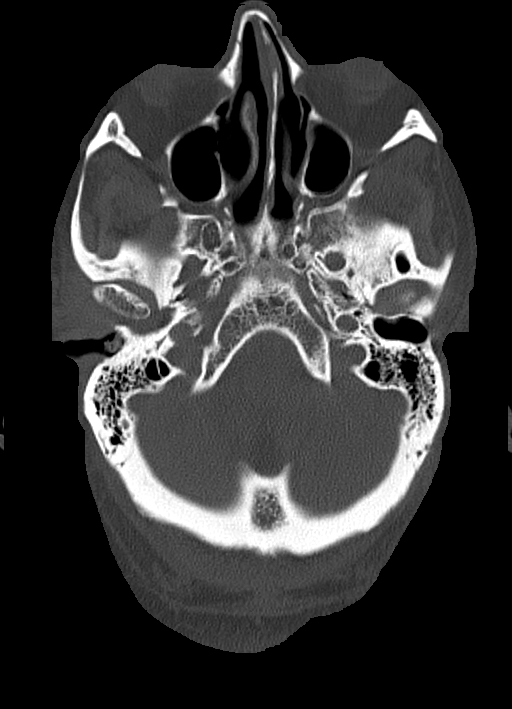
[im 29/86  bone]
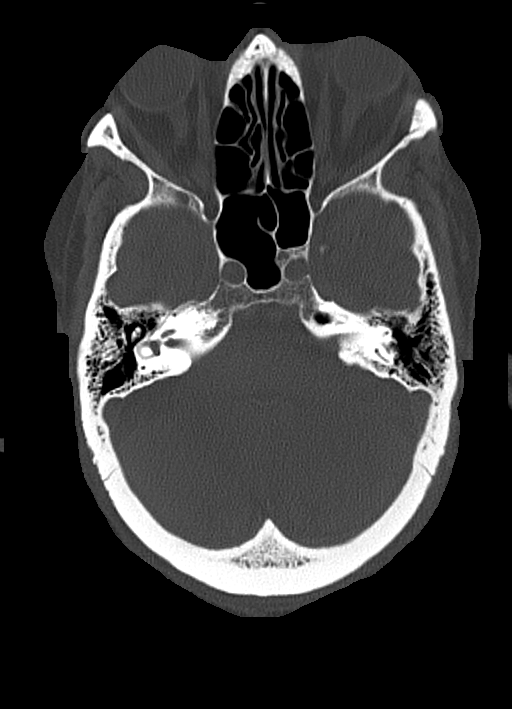
[im 36/86  bone]
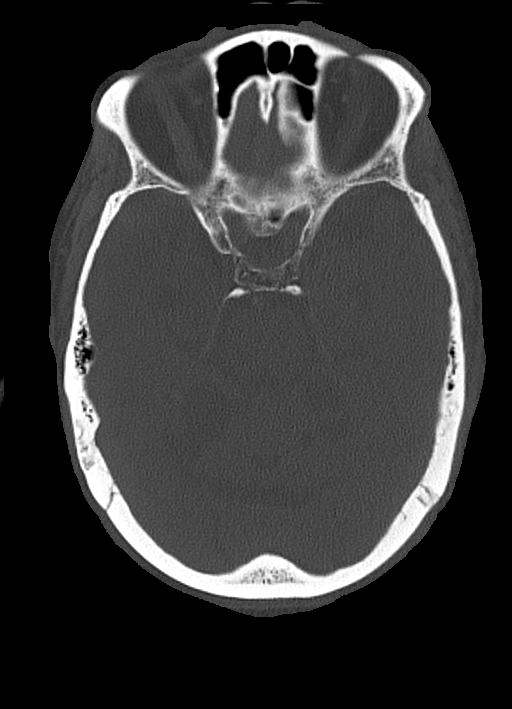
[im 50/86  bone]
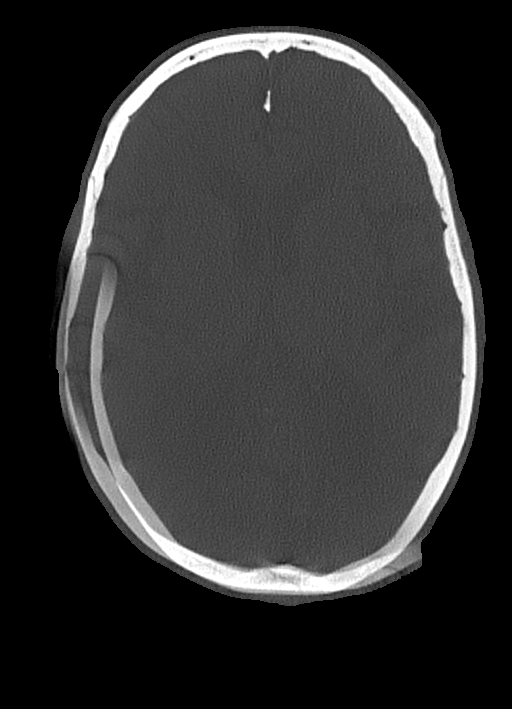

[Series 5: coronal soft tissue · coronal · 0.32mm/px · 3 of 76 slices shown]
[im 19/76  brain]
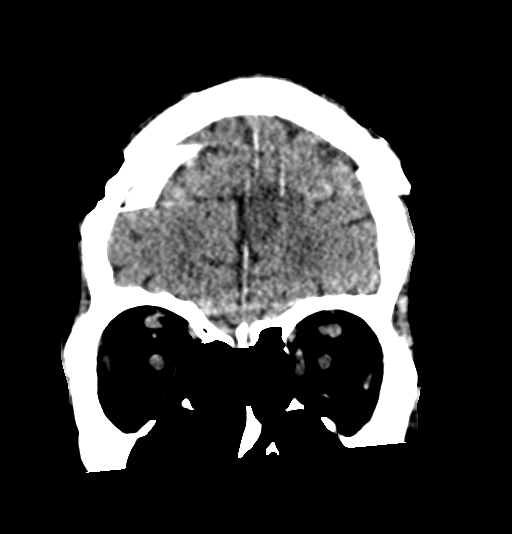
[im 38/76  brain]
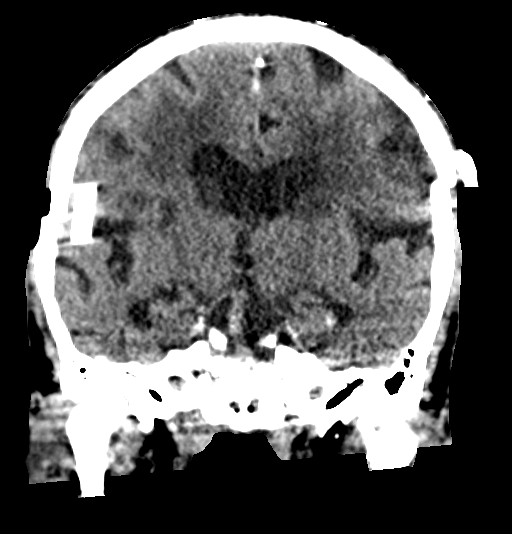
[im 57/76  brain]
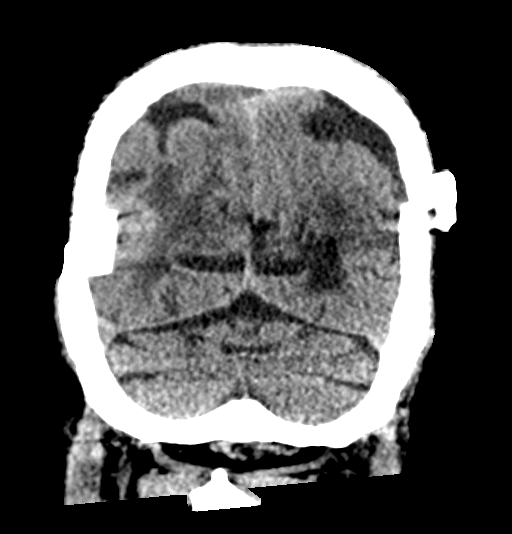

[Series 9: sagittal soft tissue · sagittal · 0.22mm/px · 2 of 53 slices shown]
[im 18/53  brain]
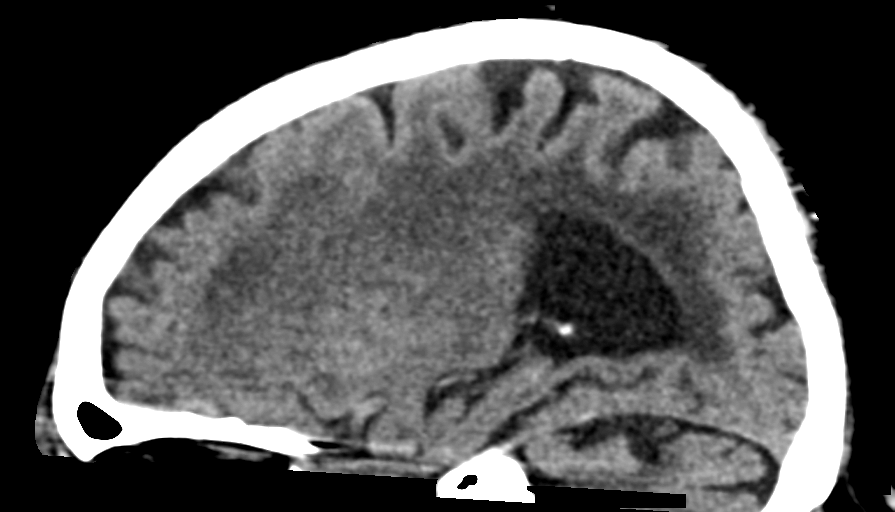
[im 35/53  brain]
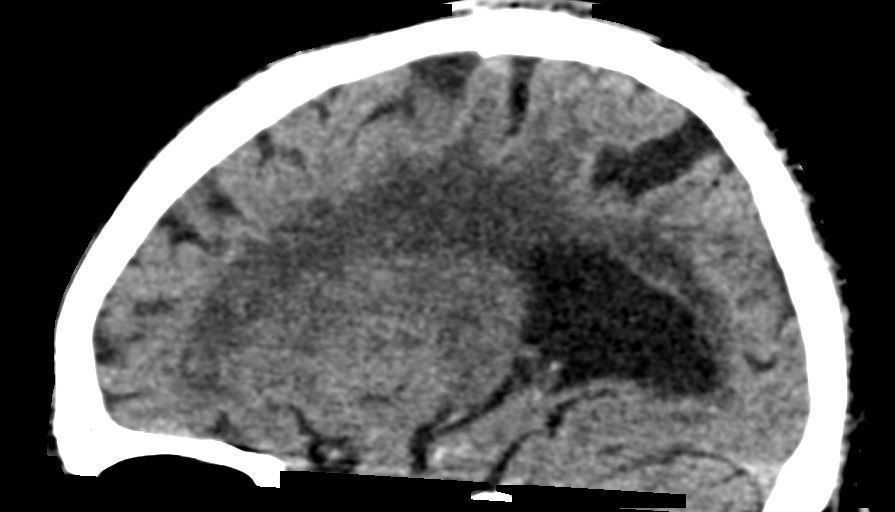

[13 of 47 positions shown; findings below may reference images not displayed]

FINDINGS: Brain: Mild chronic ischemic white matter disease is noted. Old left
occipital infarction is noted. No mass effect or midline shift is
noted. Ventricular size is within normal limits. There is no
evidence of mass lesion, hemorrhage or acute infarction.

Vascular: No hyperdense vessel or unexpected calcification.

Skull: Normal. Negative for fracture or focal lesion.

Sinuses/Orbits: No acute finding.

Other: None.
IMPRESSION: No acute intracranial abnormality seen.

## 2021-11-25 MED ORDER — LORAZEPAM 2 MG/ML IJ SOLN
0.5000 mg | Freq: Once | INTRAMUSCULAR | Status: AC | PRN
  Administered 2021-11-25: 21:00:00 0.5 mg via INTRAVENOUS
  Filled 2021-11-25: qty 1

## 2021-11-25 NOTE — Progress Notes (Signed)
Neurology brief note  PT/OT contacted myself and Dr. Roosevelt Locks that this patient admitted for AKI had become progressively weaker over the past few days and had L sensory deficits that were new. On my examination patient does have some mild L sided weakness and numbness, although he would not cooperate with most of the examination and was therefore difficult to assess. Head CT this afternoon showed no ICH. 2/2 concern for possible ischemic stroke, I have d/c'd his apixaban until we can obtain MRI brain overnight. I have also d/c'd his hydralazine, and left metoprolol on for rate control for a fib. Avoid hypotension in the setting of possible new ischemic infarct. Full consult note to follow.   Su Monks, MD [facility] [phone]  If 7pm- 7am, please page neurology on call as listed in [place].

## 2021-11-25 NOTE — Progress Notes (Signed)
°   11/25/21 1559  Assess: MEWS Score  Temp 98 F (36.7 C)  BP (!) 126/93  Pulse Rate (!) 115  Resp 18  SpO2 98 %  O2 Device Room Air  Assess: MEWS Score  MEWS Temp 0  MEWS Systolic 0  MEWS Pulse 2  MEWS RR 0  MEWS LOC 0  MEWS Score 2  MEWS Score Color Yellow  Assess: if the MEWS score is Yellow or Red  Were vital signs taken at a resting state? Yes  Focused Assessment Change from prior assessment (see assessment flowsheet)  Does the patient meet 2 or more of the SIRS criteria? No  MEWS guidelines implemented *See Row Information* Yes  Treat  MEWS Interventions Escalated (See documentation below)  Pain Scale 0-10  Pain Score 0  Notify: Charge Nurse/RN  Name of Charge Nurse/RN Notified [place] RN  Date Charge Nurse/RN Notified 11/25/21  Time Charge Nurse/RN Notified 1600  Notify: Provider  Provider Name/Title Dr Roosevelt Locks  Date Provider Notified 11/25/21  Time Provider Notified 1600  Notification Type Page (secure chat)  Provider response No new orders  Assess: SIRS CRITERIA  SIRS Temperature  0  SIRS Pulse 1  SIRS Respirations  0  SIRS WBC 0  SIRS Score Sum  1

## 2021-11-25 NOTE — Progress Notes (Signed)
Occupational Therapy Treatment Patient Details Name: George Bolton MRN: [id number] DOB: [birth year] Today's Date: 11/25/2021   History of present illness Pt is an 81 y/o F M admitted on 11/22/21 after presenting to the ED with acute onset of generalized weakness with diminished p.o. intake & abnormal labs. 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 Pt also has bilateral lower lobe CAP. PMH: chronic a-fib, chronic diastolic CHF, gout, HTN   OT comments  Pt seen for OT treatment on this date. Upon arrival to room, pt awake and moaning while sitting upright in bed. When asked if pt was in pain, pt verbalized frustration that he is significantly weaker than usual. Pt required MAX A for supine>sit transfer with HOB elevated. Once seated EOB, pt was observed to have a mouth full of food (eggs). Following verbal cues to swallow, pt attempted but food spilled out of mouth and pt endorsed having difficulty swallowing. Pt also noted to have left lateral and posterior lean during static sitting balance at EOB, requiring intermittent MIN-MOD A to maintain balance. During sensation assessment, pt endorsed decreased light touch sensation in LUE/LLE compared to R side. Further mobility/ADLs deferred in setting of pt with strength and sensation signficantly different from evaluation (RN & MD immediately informed). Pt required MAX A+2 for sit>supine transfer and was left sitting upright in bed, in chair position, with all needs within reach. Pt continues to benefit from skilled OT services to maximize return to PLOF and minimize risk of future falls, injury, caregiver burden, and readmission. Will continue to follow POC. Discharge recommendation changed to SNF to reflect pt's current functional impairments.    Recommendations for follow up therapy are one component of a multi-disciplinary discharge planning process, led by the attending physician.   Recommendations may be updated based on patient status, additional functional criteria and insurance authorization.    Follow Up Recommendations  Skilled nursing-short term rehab (<3 hours/day)    Assistance Recommended at Discharge Frequent or constant Supervision/Assistance  Patient can return home with the following  A lot of help with walking and/or transfers;A lot of help with bathing/dressing/bathroom   Equipment Recommendations  Other (comment) (defer to next venue of care)       Precautions / Restrictions Precautions Precautions: Fall Restrictions Weight Bearing Restrictions: No       Mobility Bed Mobility Overal bed mobility: Needs Assistance Bed Mobility: Supine to Sit, Sit to Supine     Supine to sit: Max assist (assist for trunk and to scoot towards edge of bed) Sit to supine: Max assist, +2 for physical assistance        Transfers                   General transfer comment: further mobility deferred by this author in setting of pt with strength and sensation signficantly different than evaluation; RN & MD immediately informed     Balance Overall balance assessment: Needs assistance Sitting-balance support: No upper extremity supported, Feet supported Sitting balance-Leahy Scale: Poor Sitting balance - Comments: With b/l UE unsupported, pt required intermittent MIN-MOD A to maintain static sitting balance at EOB. With single UE support, requires no physical assist but demonstrates L alteral lean Postural control: Left lateral lean, Posterior lean                                 ADL  either performed or assessed with clinical judgement   ADL Overall ADL's : Needs assistance/impaired Eating/Feeding: Supervision/ safety Eating/Feeding Details (indicate cue type and reason): Pt with mouth full of food (eggs) upon arrival. Following verbal cues to swallow, pt attempted but food spilled out of mouth. Pt endorsed having difficulty swallowing                                         Extremity/Trunk Assessment Upper Extremity Assessment Upper Extremity Assessment: Generalized weakness;LUE deficits/detail LUE Deficits / Details: Pt endorses decreased sensation on LUE compared to RUE. Unable to formally assess strength via MMT d/t poor command follow, however AROM appears to be [facility] LUE Sensation: decreased light touch   Lower Extremity Assessment Lower Extremity Assessment: Generalized weakness;LLE deficits/detail LLE Deficits / Details: Pt endorses decreased sensation on LLE compared to RLE. Unable to formally assess strength via MMT d/t poor command follow LLE Sensation: decreased light touch   Cervical / Trunk Assessment Cervical / Trunk Assessment: Kyphotic     Cognition Arousal/Alertness: Awake/alert Behavior During Therapy: WFL for tasks assessed/performed Overall Cognitive Status: Difficult to assess                                 General Comments: Pt with mumbled speech and moaning, responds to name, but requires increased time/cues for following 1-step instructions                   Pertinent Vitals/ Pain       Pain Assessment Pain Assessment: CPOT Facial Expression: Tense Body Movements: Restlessness Muscle Tension: Tense, rigid Compliance with ventilator (intubated pts.): N/A Vocalization (extubated pts.): Sighing, moaning CPOT Total: 5 Pain Intervention(s): Limited activity within patient's tolerance, Monitored during session, Repositioned         Frequency  Min 2X/week        Progress Toward Goals  OT Goals(current goals can now be found in the care plan section)  Progress towards OT goals: Progressing toward goals  Acute Rehab OT Goals Patient Stated Goal: to go home OT Goal Formulation: With patient Time For Goal Achievement: 12/07/21 Potential to Achieve Goals: Good  Plan Discharge plan needs to be updated;Frequency remains appropriate       AM-PAC  OT "6 Clicks" Daily Activity     Outcome Measure   Help from another person eating meals?: A Little Help from another person taking care of personal grooming?: A Lot Help from another person toileting, which includes using toliet, bedpan, or urinal?: A Lot Help from another person bathing (including washing, rinsing, drying)?: A Lot Help from another person to put on and taking off regular upper body clothing?: A Lot Help from another person to put on and taking off regular lower body clothing?: A Lot 6 Click Score: 13    End of Session    OT Visit Diagnosis: Unsteadiness on feet (R26.81);Muscle weakness (generalized) (M62.81);Pain   Activity Tolerance Treatment limited secondary to medical complications (Comment) (further mobility deferred in setting of pt with strength and sensation signficantly different than evaluation; RN & MD immediately informed)   Patient Left in bed;with call bell/phone within reach;with bed alarm set   Nurse Communication Mobility status;Other (comment) (pt with strength and sensation signficantly different than evaluation)        Time: 2951-8841 OT Time Calculation (min): 24  min  Charges: OT General Charges $OT Visit: 1 Visit OT Treatments $Self Care/Home Management : 23-37 mins  Fredirick Maudlin, OTR/L [place]

## 2021-11-25 NOTE — TOC Progression Note (Signed)
Transition of Care [facility]) - Progression Note    Patient Details  Name: FADY STAMPS MRN: [id number] Date of Birth: 01-19-1941  Transition of Care [facility]) CM/SW [place], LCSW Phone Number: 11/25/2021, 2:32 PM  Clinical Narrative:  Janeece Riggers accepted, auth started.     Expected Discharge Plan: Home/Self Care Barriers to Discharge: Continued Medical Work up  Expected Discharge Plan and Services Expected Discharge Plan: Home/Self Care In-house Referral: NA   Post Acute Care Choice: NA Living arrangements for the past 2 months: Single Family Home                           HH Arranged: NA           Social Determinants of Health (SDOH) Interventions    Readmission Risk Interventions No flowsheet data found.

## 2021-11-25 NOTE — Progress Notes (Signed)
Physical Therapy Treatment Patient Details Name: George Bolton MRN: [id number] DOB: February 03, 1941 Today's Date: 11/25/2021   History of Present Illness Pt is an 81 y/o F M admitted on 11/22/21 after presenting to the ED with acute onset of generalized weakness with diminished p.o. intake & abnormal labs. 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 Pt also has bilateral lower lobe CAP. PMH: chronic a-fib, chronic diastolic CHF, gout, HTN    PT Comments    Pt appearing to be partially sitting edge of bed upon PT walking past pt's room.  Therapist asked pt if he needed help and pt said yes and wanted to be repositioned to be able to eat breakfast better (pt agreeable to trying to get to recliner to sit up for breakfast).  Min to mod assist sit to stand up to RW and min assist to take a couple steps towards recliner with RW use; pt then unable to take anymore steps with max cueing (pt seemed to have difficulty initiating to take more steps) so pt assisted back to sitting onto bed and 2nd assist obtained to assist pt back to bed and reposition in upright position.  Generalized weakness noted.  Will continue to focus on strengthening and progressive functional mobility per pt tolerance.   Recommendations for follow up therapy are one component of a multi-disciplinary discharge planning process, led by the attending physician.  Recommendations may be updated based on patient status, additional functional criteria and insurance authorization.  Follow Up Recommendations  Skilled nursing-short term rehab (<3 hours/day)     Assistance Recommended at Discharge Frequent or constant Supervision/Assistance  Patient can return home with the following Two people to help with walking and/or transfers;Assistance with cooking/housework;Direct supervision/assist for medications management;Assist for transportation;Two people to help with  bathing/dressing/bathroom;Direct supervision/assist for financial management;Help with stairs or ramp for entrance;Assistance with feeding   Equipment Recommendations  Rolling walker (2 wheels);BSC/3in1;Wheelchair (measurements PT);Wheelchair cushion (measurements PT);Hospital bed    Recommendations for Other Services       Precautions / Restrictions Precautions Precautions: Fall Restrictions Weight Bearing Restrictions: No     Mobility  Bed Mobility Overal bed mobility: Needs Assistance Bed Mobility: Supine to Sit, Sit to Supine     Supine to sit: Mod assist, HOB elevated Sit to supine: +2 for physical assistance (to lay back down and reposition in bed using bed sheet)   General bed mobility comments: vc's for technique    Transfers Overall transfer level: Needs assistance Equipment used: Rolling walker (2 wheels) Transfers: Sit to/from Stand Sit to Stand: Min assist, Mod assist           General transfer comment: assist to initate stand up to RW; vc's for UE placement    Ambulation/Gait Ambulation/Gait assistance: Min assist Gait Distance (Feet):  (pt able to take a couple steps B LE's)   Gait Pattern/deviations: Decreased step length - right, Decreased step length - left, Decreased stride length Gait velocity: decreased     General Gait Details: cueing to keep RW closer to pt; pt able to take some steps B LE's and then pt unable to take anymore with max cueing   Stairs             Wheelchair Mobility    Modified Rankin (Stroke Patients Only)       Balance Overall balance assessment: Needs assistance Sitting-balance support: No upper extremity supported, Feet supported Sitting balance-Leahy Scale: [place] Sitting  balance - Comments: steady static sitting although pt leaning back at times to rest against pillows propped up to support pt Postural control: Posterior lean Standing balance support: Bilateral upper extremity supported Standing  balance-Leahy Scale: Fair Standing balance comment: pt steady static standing with B UE support on RW                            Cognition Arousal/Alertness: Awake/alert Behavior During Therapy: [facility] for tasks assessed/performed                                   General Comments: Increased time to respond to 1 step cues        Exercises      General Comments  Pt agreeable to PT session.       Pertinent Vitals/Pain Pain Assessment Pain Assessment: Faces Pain Score: 0-No pain Pain Intervention(s): Limited activity within patient's tolerance, Monitored during session, Repositioned Vitals (HR and O2 on room air) stable and WFL throughout treatment session.    Home Living                          Prior Function            PT Goals (current goals can now be found in the care plan section) Acute Rehab PT Goals Patient Stated Goal: get better PT Goal Formulation: With patient Time For Goal Achievement: 12/07/21 Potential to Achieve Goals: Fair Progress towards PT goals: Progressing toward goals    Frequency    Min 2X/week      PT Plan Current plan remains appropriate    Co-evaluation              AM-PAC PT "6 Clicks" Mobility   Outcome Measure  Help needed turning from your back to your side while in a flat bed without using bedrails?: A Lot Help needed moving from lying on your back to sitting on the side of a flat bed without using bedrails?: A Lot Help needed moving to and from a bed to a chair (including a wheelchair)?: Total Help needed standing up from a chair using your arms (e.g., wheelchair or bedside chair)?: A Lot Help needed to walk in hospital room?: Total Help needed climbing 3-5 steps with a railing? : Total 6 Click Score: 9    End of Session Equipment Utilized During Treatment: Gait belt Activity Tolerance: Patient limited by fatigue Patient left: in bed;with call bell/phone within reach;with bed  alarm set Nurse Communication: Mobility status;Precautions PT Visit Diagnosis: Unsteadiness on feet (R26.81);Muscle weakness (generalized) (M62.81)     Time: 0272-5366 PT Time Calculation (min) (ACUTE ONLY): 26 min  Charges:  $Therapeutic Activity: 23-37 mins                    Leitha Bleak, PT 11/25/21, 3:13 PM

## 2021-11-25 NOTE — Progress Notes (Signed)
Heart rate stable no worsening shortness of breath or failure recommend advance blood pressure medications as necessary.  Renal insufficiency unchanged for the past few days. Cardiology will sign off Reconsult if necessary [place]

## 2021-11-25 NOTE — Progress Notes (Signed)
[facility]  ROUNDING NOTE   Subjective:   Patient seen sitting at side of the bed, alert Patient voicing frustrations about condom catheter, states is uncomfortable Tolerating meals, denies nausea and vomiting Denies shortness of breath  Creatinine stable  Urine output 2.4 L recorded in previous 24 hours  Objective:  Vital signs in last 24 hours:  Temp:  [97.7 F (36.5 C)-98.9 F (37.2 C)] 97.7 F (36.5 C) (01/31 1158) Pulse Rate:  [48-149] 103 (01/31 1158) Resp:  [14-22] 20 (01/31 1158) BP: (107-184)/(78-128) 107/78 (01/31 1158) SpO2:  [92 %-99 %] 97 % (01/31 1158)  Weight change:  Filed Weights   11/21/21 1800  Weight: 79.4 kg    Intake/Output: I/O last 3 completed shifts: In: 4528.8 [P.O.:120; I.V.:4108.8; IV Piggyback:300] Out: 3000 [Urine:3000]   Intake/Output this shift:  No intake/output data recorded.  Physical Exam: General: Agitated  Head: Normocephalic, atraumatic. Moist oral mucosal membranes  Eyes: Anicteric  Lungs:  Clear to auscultation, normal effort, room air  Heart: Regular rate and rhythm  Abdomen:  Soft, nontender, nondistended  Extremities: No peripheral edema.  Neurologic: Alert, moving all four extremities  Skin: No lesions       Basic Metabolic Panel: Recent Labs  Lab 11/21/21 1803 11/22/21 0515 11/23/21 0613 11/24/21 0625 11/25/21 0517  NA 139 136 136 135 137  K 4.6 4.6 4.8 4.7 4.1  CL 107 109 109 107 107  CO2 20* 19* 21* 18* 21*  GLUCOSE 125* 148* 95 105* 103*  BUN 57* 58* 55* 47* 42*  CREATININE 3.64* 3.63* 3.29* 2.97* 3.02*  CALCIUM 9.4 8.9 8.7* 8.9 8.7*  MG  --   --   --   --  2.2    Liver Function Tests: No results for input(s): AST, ALT, ALKPHOS, BILITOT, PROT, ALBUMIN in the last 168 hours. No results for input(s): LIPASE, AMYLASE in the last 168 hours. No results for input(s): AMMONIA in the last 168 hours.  CBC: Recent Labs  Lab 11/21/21 1803 11/22/21 0515 11/23/21 0613  WBC 17.3* 12.1*  8.8  NEUTROABS  --   --  6.7  HGB 14.8 14.1 11.9*  HCT 46.0 43.2 36.9*  MCV 85.3 83.1 84.2  PLT 198 170 167    Cardiac Enzymes: No results for input(s): CKTOTAL, CKMB, CKMBINDEX, TROPONINI in the last 168 hours.  BNP: Invalid input(s): POCBNP  CBG: Recent Labs  Lab 11/24/21 1246 11/24/21 1631 11/24/21 1713 11/24/21 2207 11/25/21 0830  GLUCAP 166* 118* 134* 143* 111*    Microbiology: Results for orders placed or performed during the hospital encounter of 11/22/21  Resp Panel by RT-PCR (Flu A&B, Covid) Nasopharyngeal Swab     Status: None   Collection Time: 11/22/21  1:00 AM   Specimen: Nasopharyngeal Swab; Nasopharyngeal(NP) swabs in vial transport medium  Result Value Ref Range Status   SARS Coronavirus 2 by RT PCR NEGATIVE NEGATIVE Final    Comment: (NOTE) SARS-CoV-2 target nucleic acids are NOT DETECTED.  The SARS-CoV-2 RNA is generally detectable in upper respiratory specimens during the acute phase of infection. The lowest concentration of SARS-CoV-2 viral copies this assay can detect is 138 copies/mL. A negative result does not preclude SARS-Cov-2 infection and should not be used as the sole basis for treatment or other patient management decisions. A negative result may occur with  improper specimen collection/handling, submission of specimen other than nasopharyngeal swab, presence of viral mutation(s) within the areas targeted by this assay, and inadequate number of viral copies(<138 copies/mL). A  negative result must be combined with clinical observations, patient history, and epidemiological information. The expected result is Negative.  Fact Sheet for Patients:  EntrepreneurPulse.com.au  Fact Sheet for Healthcare Providers:  IncredibleEmployment.be  This test is no t yet approved or cleared by the [place] FDA and  has been authorized for detection and/or diagnosis of SARS-CoV-2 by FDA under an Emergency Use  Authorization (EUA). This EUA will remain  in effect (meaning this test can be used) for the duration of the COVID-19 declaration under Section 564(b)(1) of the Act, 21 U.S.C.section 360bbb-3(b)(1), unless the authorization is terminated  or revoked sooner.       Influenza A by PCR NEGATIVE NEGATIVE Final   Influenza B by PCR NEGATIVE NEGATIVE Final    Comment: (NOTE) The Xpert Xpress SARS-CoV-2/FLU/RSV plus assay is intended as an aid in the diagnosis of influenza from Nasopharyngeal swab specimens and should not be used as a sole basis for treatment. Nasal washings and aspirates are unacceptable for Xpert Xpress SARS-CoV-2/FLU/RSV testing.  Fact Sheet for Patients: EntrepreneurPulse.com.au  Fact Sheet for Healthcare Providers: IncredibleEmployment.be  This test is not yet approved or cleared by the [place] FDA and has been authorized for detection and/or diagnosis of SARS-CoV-2 by FDA under an Emergency Use Authorization (EUA). This EUA will remain in effect (meaning this test can be used) for the duration of the COVID-19 declaration under Section 564(b)(1) of the Act, 21 U.S.C. section 360bbb-3(b)(1), unless the authorization is terminated or revoked.  Performed at [facility], [address] [place], [place] [address]     Coagulation Studies: No results for input(s): LABPROT, INR in the last 72 hours.  Urinalysis: No results for input(s): COLORURINE, LABSPEC, PHURINE, GLUCOSEU, HGBUR, BILIRUBINUR, KETONESUR, PROTEINUR, UROBILINOGEN, NITRITE, LEUKOCYTESUR in the last 72 hours.  Invalid input(s): APPERANCEUR    Imaging: CT HEAD WO CONTRAST (5MM)  Result Date: 11/25/2021 CLINICAL DATA:  Generalized weakness. EXAM: CT HEAD WITHOUT CONTRAST TECHNIQUE: Contiguous axial images were obtained from the base of the skull through the vertex without intravenous contrast. RADIATION DOSE REDUCTION: This exam was performed  according to the departmental dose-optimization program which includes automated exposure control, adjustment of the mA and/or kV according to patient size and/or use of iterative reconstruction technique. COMPARISON:  April 19, 2020. 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 No mass effect or midline shift is noted. Ventricular size is within normal limits. There is no evidence of mass lesion, hemorrhage or acute infarction. Vascular: No hyperdense vessel or unexpected calcification. Skull: Normal. Negative for fracture or focal lesion. Sinuses/Orbits: No acute finding. Other: None. IMPRESSION: No acute intracranial abnormality seen. Electronically Signed   By: Marijo Conception M.D.   On: 11/25/2021 11:53     Medications:    cefTRIAXone (ROCEPHIN)  IV 1 g (11/24/21 1110)    allopurinol  50 mg Oral Daily   apixaban  2.5 mg Oral BID   atorvastatin  40 mg Oral QPM   azithromycin  500 mg Oral Daily   cloNIDine  0.1 mg Oral BID   famotidine  10 mg Oral Daily   hydrALAZINE  25 mg Oral TID   insulin aspart  0-9 Units Subcutaneous TID AC & HS   isosorbide mononitrate  30 mg Oral Daily   metoprolol tartrate  50 mg Oral BID   acetaminophen **OR** acetaminophen, magnesium hydroxide, ondansetron **OR** ondansetron (ZOFRAN) IV, traZODone  Assessment/ Plan:  Mr. George Bolton  is a 81 y.o.  male with previous medical history of chronic diastolic CHF, hypertension, atrial fibrillation, and gout.  Patient has been admitted for Dehydration [E86.0] Elevated troponin [R77.8] Generalized weakness [R53.1] Acute renal failure, unspecified acute renal failure type ([place]) [N17.9] Acute kidney injury superimposed on chronic kidney disease ([place]) [N17.9, N18.9] Leukocytosis, unspecified type [D72.829]   Acute Kidney Injury on chronic kidney disease stage IIIa with baseline creatinine 1.3 and GFR of 53 on 07/01/21.  Acute kidney injury secondary to  dehydration and volume depletion Renal ultrasound shows no obstruction of bilateral renal cyst.  Furosemide currently held No acute indication of dialysis at this time.  Creatinine currently 3.02, appears to be at plateau.  We will continue to encourage patient oral intake and monitor renal function.   Lab Results  Component Value Date   CREATININE 3.02 (H) 11/25/2021   CREATININE 2.97 (H) 11/24/2021   CREATININE 3.29 (H) 11/23/2021    Intake/Output Summary (Last 24 hours) at 11/25/2021 1222 Last data filed at 11/25/2021 0457 Gross per 24 hour  Intake 100 ml  Output 1600 ml  Net -1500 ml   2.  Home regimen includes clonidine, furosemide, hydralazine, isosorbide, and metoprolol.  Currently receiving all medications except furosemide.  BP currently 107/78.  3.  Chronic atrial fibrillation.  Home regimen includes Eliquis.  This is continued during this hospitalization.  4.  Chronic diastolic heart failure.  Echo on 11/22/2021 shows EF of 60 to 65% with moderate concentric left ventricular hypertrophy.  Left ventricular diastolic parameters indeterminate.    LOS: 3   1/31/202312:22 PM

## 2021-11-25 NOTE — NC FL2 (Addendum)
[place] LEVEL OF CARE SCREENING TOOL     IDENTIFICATION  Patient Name: George Bolton Birthdate: November 23, 1940 Sex: male Admission Date (Current Location): 11/22/2021  [facility] and [place] Number:  Engineering geologist and Address:  [name], [address] [place], [place] [address]      Provider Number: [id number]  Attending Physician Name and Address:  Sharen Hones, MD  Relative Name and Phone Number:       Current Level of Care: Hospital Recommended Level of Care: [place] Prior Approval Number:    Date Approved/Denied:   PASRR Number: [id number]  Discharge Plan: SNF    Current Diagnoses: Patient Active Problem List   Diagnosis Date Noted   Community acquired bilateral lower lobe pneumonia 94/85/4627   Metabolic acidemia 03/50/0938   Acute kidney injury superimposed on chronic kidney disease ([place]) 11/22/2021   Atrial fibrillation with RVR ([place]) 05/21/2021   CKD (chronic kidney disease), stage III ([place]) 05/21/2021   Chronic diastolic congestive heart failure ([place]) 12/01/2018   Atrial fibrillation ([place]) 03/15/2018   Lymphedema 03/15/2018   Gout 01/25/2017   CHF (congestive heart failure) ([place]) 04/25/2015   Hypertension 04/25/2015    Orientation RESPIRATION BLADDER Height & Weight     Self  Normal Incontinent Weight: 175 lb (79.4 kg) Height:  6' (182.9 cm)  BEHAVIORAL SYMPTOMS/MOOD NEUROLOGICAL BOWEL NUTRITION STATUS      Continent MINCED diet w/ thin liquids; dysphagia level 2  AMBULATORY STATUS COMMUNICATION OF NEEDS Skin   Extensive Assist Verbally Normal                       Personal Care Assistance Level of Assistance  Dressing, Feeding, Bathing Bathing Assistance: Maximum assistance Feeding assistance: Independent Dressing Assistance: Maximum assistance     Functional Limitations Info  Sight, Hearing, Speech Sight Info: Adequate Hearing Info: Adequate Speech Info: Adequate     SPECIAL CARE FACTORS FREQUENCY  PT (By licensed PT), OT (By licensed OT)     PT Frequency: 5x OT Frequency: 5x            Contractures Contractures Info: Not present    Additional Factors Info  Code Status, Allergies Code Status Info: DNR Allergies Info: Cortisone, Triamcinolone           Current Medications (11/25/2021):  This is the current hospital active medication list Current Facility-Administered Medications  Medication Dose Route Frequency Provider Last Rate Last Admin   acetaminophen (TYLENOL) tablet 650 mg  650 mg Oral Q6H PRN Mansy, Jan A, MD       Or   acetaminophen (TYLENOL) suppository 650 mg  650 mg Rectal Q6H PRN Mansy, Jan A, MD       allopurinol (ZYLOPRIM) tablet 50 mg  50 mg Oral Daily Sharen Hones, MD   50 mg at 11/25/21 0850   apixaban (ELIQUIS) tablet 2.5 mg  2.5 mg Oral BID Sharen Hones, MD   2.5 mg at 11/25/21 0850   atorvastatin (LIPITOR) tablet 40 mg  40 mg Oral QPM Sharen Hones, MD   40 mg at 11/24/21 1718   azithromycin (ZITHROMAX) tablet 500 mg  500 mg Oral Daily Sharen Hones, MD   500 mg at 11/25/21 0850   cefTRIAXone (ROCEPHIN) 1 g in sodium chloride 0.9 % 100 mL IVPB  1 g Intravenous Q24H Sharen Hones, MD 200 mL/hr at 11/25/21 1250 1 g at 11/25/21 1250   cloNIDine (CATAPRES) tablet 0.1 mg  0.1 mg  Oral BID Sharen Hones, MD   0.1 mg at 11/25/21 0850   famotidine (PEPCID) tablet 10 mg  10 mg Oral Daily Sharen Hones, MD   10 mg at 11/25/21 0850   hydrALAZINE (APRESOLINE) tablet 25 mg  25 mg Oral TID Sharen Hones, MD   25 mg at 11/25/21 1256   insulin aspart (novoLOG) injection 0-9 Units  0-9 Units Subcutaneous TID [facility] Mansy, Jan A, MD   1 Units at 11/25/21 1256   isosorbide mononitrate (IMDUR) 24 hr tablet 30 mg  30 mg Oral Daily Sharen Hones, MD   30 mg at 11/25/21 0850   magnesium hydroxide (MILK OF MAGNESIA) suspension 30 mL  30 mL Oral Daily PRN Mansy, Jan A, MD       metoprolol tartrate (LOPRESSOR) tablet 50 mg  50 mg Oral BID Sharen Hones, MD   50 mg at 11/25/21 0850   ondansetron (ZOFRAN) tablet 4 mg  4 mg Oral Q6H PRN Mansy, Jan A, MD       Or   ondansetron [facility]) injection 4 mg  4 mg Intravenous Q6H PRN Mansy, Jan A, MD       traZODone (DESYREL) tablet 25 mg  25 mg Oral QHS PRN Mansy, Jan A, MD   25 mg at 11/24/21 2050     Discharge Medications: Please see discharge summary for a list of discharge medications.  Relevant Imaging Results:  Relevant Lab Results:   Additional Information MYT:[id number]  Gerrianne Scale Dessirae Scarola, LCSW

## 2021-11-25 NOTE — Progress Notes (Signed)
PROGRESS NOTE    George Bolton  DUK:[id number] DOB: 1940/11/28 DOA: 11/22/2021 PCP: Denton Lank, MD    Brief Narrative:  George Bolton is a 81 y.o. African-American male with medical history significant for chronic atrial fibrillation, chronic diastolic CHF, gout and hypertension, presented to the ER with acute onset of generalized weakness with diminished p.o. intake and abnormal labs that were checked by PCP with elevated creatinine.  Creatinine was 3.  6 3 with baseline 1.33.  IV fluids were started.  Procalcitonin 5.94, chest x-ray did not show any acute changes.  Repeated chest x-ray after rehydration showed bilateral lower lobe infiltrates consistent with pneumonia.  Antibiotics were started with Rocephin and Zithromax.     Assessment & Plan:   Principal Problem:   Acute kidney injury superimposed on chronic kidney disease ([place]) Active Problems:   Gout   Atrial fibrillation (HCC)   Chronic diastolic congestive heart failure ([place])   Community acquired bilateral lower lobe pneumonia   Metabolic acidemia  Generalized weakness. Patient has worsening weakness since admitted to the hospital.  He also has right facial droop.  Neurology consult was obtained, CT head without contrast no acute changes. Patient condition most likely is due to his medical condition, but a stroke need to rule out.  Acute kidney injury superimposed on chronic kidney disease stage IIIa. Metabolic acidosis. Elevated troponin secondary to acute renal failure. Patient renal function was slightly better, but still not at baseline.  He received 20 mg IV Lasix for worsening short of breath yesterday.  Renal ultrasound showed chronic kidney disease, did not show any obstruction. Bladder scan did not show any urinary retention. Patient has been seen by nephrology.  Continue to monitor renal function for now.  Bilateral lower lobe community-acquired pneumonia.  Ruled in. Continue Rocephin and  Zithromax. Condition seem to be improving. Will obtain speech therapy to evaluate to rule out any possibility of aspiration.   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 Continue allopurinol. No exacerbation.  Essential hypertension. Continue beta-blocker and clonidine.  Chronic diastolic congestive heart failure. Still no evidence of volume overload.  Continue fluids.  Hold diuretics.  Type 2 diabetes.   Continue sliding scale insulin.  Chronic atrial fibrillation with rapid ventricle response. Eliquis and beta-blocker.     DVT prophylaxis: Eliquis Code Status: Full Family Communication:  Disposition Plan:    Status is: Inpatient Remains inpatient appropriate because: Severity of disease.  Planned Discharge Destination: Skilled nursing facility          I/O last 3 completed shifts: In: 4528.8 [P.O.:120; I.V.:4108.8; IV Piggyback:300] Out: 3000 [Urine:3000] No intake/output data recorded.     Consultants:  Nephrology, neurology.  Cardiology.  Procedures: None  Antimicrobials: Ceftriaxone and Zithromax.  Subjective: Patient has significant weakness today, he has difficulty standing up.  He appears to have some dysphagia with pocketing food when swallowing. He has a poor appetite, but no nausea vomiting or diarrhea. He had some short of breath last night, mild cough. No fever chills  Objective: Vitals:   11/25/21 0652 11/25/21 0829 11/25/21 1158 11/25/21 1245  BP: (!) 132/106 (!) 152/83 107/78 (!) 115/92  Pulse: 64 65 (!) 103 (!) 109  Resp: 20 18 20 20   Temp:  98.8 F (37.1 C) 97.7 F (36.5 C) 97.7 F (36.5 C)  TempSrc:    Oral  SpO2: 96% 94% 97% 98%  Weight:      Height:        Intake/Output Summary (Last 24 hours)  at 11/25/2021 1352 Last data filed at 11/25/2021 0457 Gross per 24 hour  Intake 100 ml  Output 1600 ml  Net -1500 ml   Filed Weights   11/21/21 1800  Weight: 79.4 kg    Examination:  General exam: Appears calm and  comfortable  Respiratory system: Clear to auscultation. Respiratory effort normal. Cardiovascular system: S1 & S2 heard, RRR. No JVD, murmurs, rubs, gallops or clicks. No pedal edema. Gastrointestinal system: Abdomen is nondistended, soft and nontender. No organomegaly or masses felt. Normal bowel sounds heard. Central nervous system: Alert and oriented x2.  No focal neurological deficits. Extremities: Symmetric 5 x 5 power. Skin: No rashes, lesions or ulcers .     Data Reviewed: I have personally reviewed following labs and imaging studies  CBC: Recent Labs  Lab 11/21/21 1803 11/22/21 0515 11/23/21 0613  WBC 17.3* 12.1* 8.8  NEUTROABS  --   --  6.7  HGB 14.8 14.1 11.9*  HCT 46.0 43.2 36.9*  MCV 85.3 83.1 84.2  PLT 198 170 779   Basic Metabolic Panel: Recent Labs  Lab 11/21/21 1803 11/22/21 0515 11/23/21 0613 11/24/21 0625 11/25/21 0517  NA 139 136 136 135 137  K 4.6 4.6 4.8 4.7 4.1  CL 107 109 109 107 107  CO2 20* 19* 21* 18* 21*  GLUCOSE 125* 148* 95 105* 103*  BUN 57* 58* 55* 47* 42*  CREATININE 3.64* 3.63* 3.29* 2.97* 3.02*  CALCIUM 9.4 8.9 8.7* 8.9 8.7*  MG  --   --   --   --  2.2   GFR: Estimated Creatinine Clearance: 20.7 mL/min (A) (by C-G formula based on SCr of 3.02 mg/dL (H)). Liver Function Tests: No results for input(s): AST, ALT, ALKPHOS, BILITOT, PROT, ALBUMIN in the last 168 hours. No results for input(s): LIPASE, AMYLASE in the last 168 hours. No results for input(s): AMMONIA in the last 168 hours. Coagulation Profile: No results for input(s): INR, PROTIME in the last 168 hours. Cardiac Enzymes: No results for input(s): CKTOTAL, CKMB, CKMBINDEX, TROPONINI in the last 168 hours. BNP (last 3 results) No results for input(s): PROBNP in the last 8760 hours. HbA1C: No results for input(s): HGBA1C in the last 72 hours. CBG: Recent Labs  Lab 11/24/21 1631 11/24/21 1713 11/24/21 2207 11/25/21 0830 11/25/21 1245  GLUCAP 118* 134* 143* 111*  130*   Lipid Profile: No results for input(s): CHOL, HDL, LDLCALC, TRIG, CHOLHDL, LDLDIRECT in the last 72 hours. Thyroid Function Tests: No results for input(s): TSH, T4TOTAL, FREET4, T3FREE, THYROIDAB in the last 72 hours. Anemia Panel: No results for input(s): VITAMINB12, FOLATE, FERRITIN, TIBC, IRON, RETICCTPCT in the last 72 hours. Sepsis Labs: Recent Labs  Lab 11/21/21 1803 11/21/21 2200 11/22/21 0515 11/23/21 0613  PROCALCITON  --  5.94 5.40 2.38  LATICACIDVEN 1.9  --   --   --     Recent Results (from the past 240 hour(s))  Resp Panel by RT-PCR (Flu A&B, Covid) Nasopharyngeal Swab     Status: None   Collection Time: 11/22/21  1:00 AM   Specimen: Nasopharyngeal Swab; Nasopharyngeal(NP) swabs in vial transport medium  Result Value Ref Range Status   SARS Coronavirus 2 by RT PCR NEGATIVE NEGATIVE Final    Comment: (NOTE) SARS-CoV-2 target nucleic acids are NOT DETECTED.  The SARS-CoV-2 RNA is generally detectable in upper respiratory specimens during the acute phase of infection. The lowest concentration of SARS-CoV-2 viral copies this assay can detect is 138 copies/mL. A negative result does not preclude  SARS-Cov-2 infection and should not be used as the sole basis for treatment or other patient management decisions. A negative result may occur with  improper specimen collection/handling, submission of specimen other than nasopharyngeal swab, presence of viral mutation(s) within the areas targeted by this assay, and inadequate number of viral copies(<138 copies/mL). A negative result must be combined with clinical observations, patient history, and epidemiological information. The expected result is Negative.  Fact Sheet for Patients:  EntrepreneurPulse.com.au  Fact Sheet for Healthcare Providers:  IncredibleEmployment.be  This test is no t yet approved or cleared by the [place] FDA and  has been authorized for detection  and/or diagnosis of SARS-CoV-2 by FDA under an Emergency Use Authorization (EUA). This EUA will remain  in effect (meaning this test can be used) for the duration of the COVID-19 declaration under Section 564(b)(1) of the Act, 21 U.S.C.section 360bbb-3(b)(1), unless the authorization is terminated  or revoked sooner.       Influenza A by PCR NEGATIVE NEGATIVE Final   Influenza B by PCR NEGATIVE NEGATIVE Final    Comment: (NOTE) The Xpert Xpress SARS-CoV-2/FLU/RSV plus assay is intended as an aid in the diagnosis of influenza from Nasopharyngeal swab specimens and should not be used as a sole basis for treatment. Nasal washings and aspirates are unacceptable for Xpert Xpress SARS-CoV-2/FLU/RSV testing.  Fact Sheet for Patients: EntrepreneurPulse.com.au  Fact Sheet for Healthcare Providers: IncredibleEmployment.be  This test is not yet approved or cleared by the [place] FDA and has been authorized for detection and/or diagnosis of SARS-CoV-2 by FDA under an Emergency Use Authorization (EUA). This EUA will remain in effect (meaning this test can be used) for the duration of the COVID-19 declaration under Section 564(b)(1) of the Act, 21 U.S.C. section 360bbb-3(b)(1), unless the authorization is terminated or revoked.  Performed at [facility], [address] [place], [place] [address]          Radiology Studies: CT HEAD WO CONTRAST (5MM)  Result Date: 11/25/2021 CLINICAL DATA:  Generalized weakness. EXAM: CT HEAD WITHOUT CONTRAST TECHNIQUE: Contiguous axial images were obtained from the base of the skull through the vertex without intravenous contrast. RADIATION DOSE REDUCTION: This exam was performed according to the departmental dose-optimization program which includes automated exposure control, adjustment of the mA and/or kV according to patient size and/or use of iterative reconstruction technique. COMPARISON:  April 19, 2020. 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 No mass effect or midline shift is noted. Ventricular size is within normal limits. There is no evidence of mass lesion, hemorrhage or acute infarction. Vascular: No hyperdense vessel or unexpected calcification. Skull: Normal. Negative for fracture or focal lesion. Sinuses/Orbits: No acute finding. Other: None. IMPRESSION: No acute intracranial abnormality seen. Electronically Signed   By: Marijo Conception M.D.   On: 11/25/2021 11:53        Scheduled Meds:  allopurinol  50 mg Oral Daily   apixaban  2.5 mg Oral BID   atorvastatin  40 mg Oral QPM   azithromycin  500 mg Oral Daily   cloNIDine  0.1 mg Oral BID   famotidine  10 mg Oral Daily   hydrALAZINE  25 mg Oral TID   insulin aspart  0-9 Units Subcutaneous TID AC & HS   isosorbide mononitrate  30 mg Oral Daily   metoprolol tartrate  50 mg Oral BID   Continuous Infusions:  cefTRIAXone (ROCEPHIN)  IV 1 g (11/25/21  1250)     LOS: 3 days    Time spent: 36 minutes    Sharen Hones, MD [facility]   To contact the attending provider between 7A-7P or the covering provider during after hours 7P-7A, please log into the web site [email or web address] and access using universal [place] password for that web site. If you do not have the password, please call the hospital operator.  11/25/2021, 1:52 PM

## 2021-11-26 ENCOUNTER — Inpatient Hospital Stay: Payer: Medicare Other

## 2021-11-26 DIAGNOSIS — I4821 Permanent atrial fibrillation: Secondary | ICD-10-CM

## 2021-11-26 DIAGNOSIS — I5032 Chronic diastolic (congestive) heart failure: Secondary | ICD-10-CM

## 2021-11-26 DIAGNOSIS — I639 Cerebral infarction, unspecified: Secondary | ICD-10-CM

## 2021-11-26 DIAGNOSIS — M10041 Idiopathic gout, right hand: Secondary | ICD-10-CM

## 2021-11-26 HISTORY — DX: Cerebral infarction, unspecified: I63.9

## 2021-11-26 LAB — CBC WITH DIFFERENTIAL/PLATELET
Abs Immature Granulocytes: 0.09 10*3/uL — ABNORMAL HIGH (ref 0.00–0.07)
Basophils Absolute: 0 10*3/uL (ref 0.0–0.1)
Basophils Relative: 0 %
Eosinophils Absolute: 0.3 10*3/uL (ref 0.0–0.5)
Eosinophils Relative: 2 %
HCT: 39.7 % (ref 39.0–52.0)
Hemoglobin: 13 g/dL (ref 13.0–17.0)
Immature Granulocytes: 1 %
Lymphocytes Relative: 7 %
Lymphs Abs: 0.8 10*3/uL (ref 0.7–4.0)
MCH: 27.3 pg (ref 26.0–34.0)
MCHC: 32.7 g/dL (ref 30.0–36.0)
MCV: 83.2 fL (ref 80.0–100.0)
Monocytes Absolute: 1 10*3/uL (ref 0.1–1.0)
Monocytes Relative: 8 %
Neutro Abs: 9.6 10*3/uL — ABNORMAL HIGH (ref 1.7–7.7)
Neutrophils Relative %: 82 %
Platelets: 240 10*3/uL (ref 150–400)
RBC: 4.77 MIL/uL (ref 4.22–5.81)
RDW: 16.4 % — ABNORMAL HIGH (ref 11.5–15.5)
WBC: 11.8 10*3/uL — ABNORMAL HIGH (ref 4.0–10.5)
nRBC: 0 % (ref 0.0–0.2)

## 2021-11-26 LAB — BASIC METABOLIC PANEL
Anion gap: 11 (ref 5–15)
BUN: 43 mg/dL — ABNORMAL HIGH (ref 8–23)
CO2: 20 mmol/L — ABNORMAL LOW (ref 22–32)
Calcium: 8.9 mg/dL (ref 8.9–10.3)
Chloride: 107 mmol/L (ref 98–111)
Creatinine, Ser: 3.05 mg/dL — ABNORMAL HIGH (ref 0.61–1.24)
GFR, Estimated: 20 mL/min — ABNORMAL LOW (ref >60)
Glucose, Bld: 124 mg/dL — ABNORMAL HIGH (ref 70–99)
Potassium: 4.5 mmol/L (ref 3.5–5.1)
Sodium: 138 mmol/L (ref 135–145)

## 2021-11-26 LAB — PHOSPHORUS: Phosphorus: 3.1 mg/dL (ref 2.5–4.6)

## 2021-11-26 LAB — GLUCOSE, CAPILLARY
Glucose-Capillary: 109 mg/dL — ABNORMAL HIGH (ref 70–99)
Glucose-Capillary: 134 mg/dL — ABNORMAL HIGH (ref 70–99)
Glucose-Capillary: 140 mg/dL — ABNORMAL HIGH (ref 70–99)
Glucose-Capillary: 225 mg/dL — ABNORMAL HIGH (ref 70–99)

## 2021-11-26 LAB — MAGNESIUM: Magnesium: 2.2 mg/dL (ref 1.7–2.4)

## 2021-11-26 IMAGING — CT CT HEAD W/O CM
3 series · 17 of 37 positions shown, 19 images · non-contrast
Comparison: [DATE]

CLINICAL DATA: Worsening dysarthria and lethargy, acute stroke on
MRI yesterday



[Series 5: head wo · axial · 0.58mm/px · z∈[-248,-104]mm · 7 of 39 slices shown, 9 images]
[im 5/39  brain]
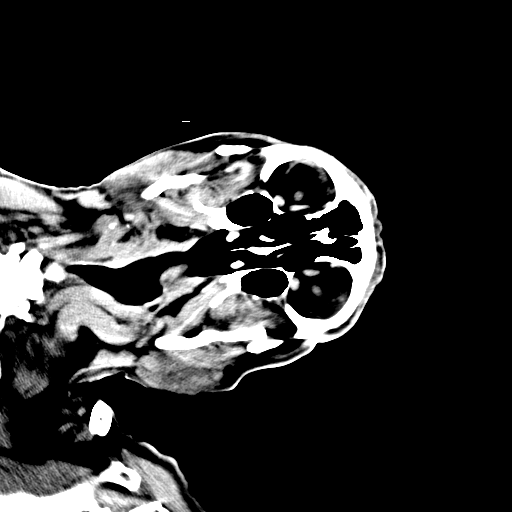
[im 5/39  bone]
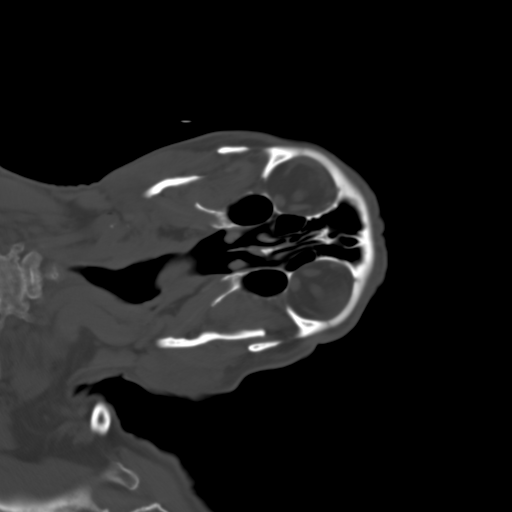
[im 10/39  brain]
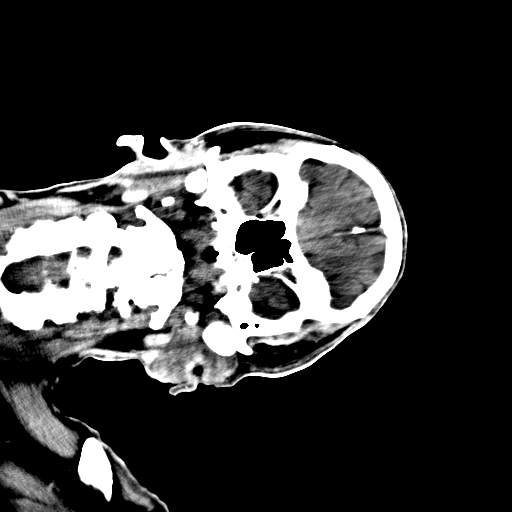
[im 15/39  brain]
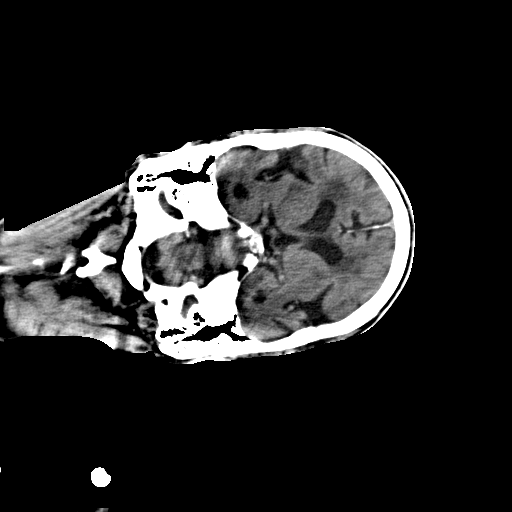
[im 20/39  brain]
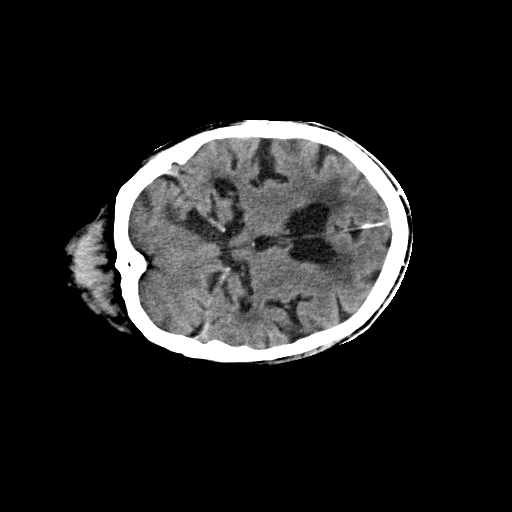
[im 24/39  brain]
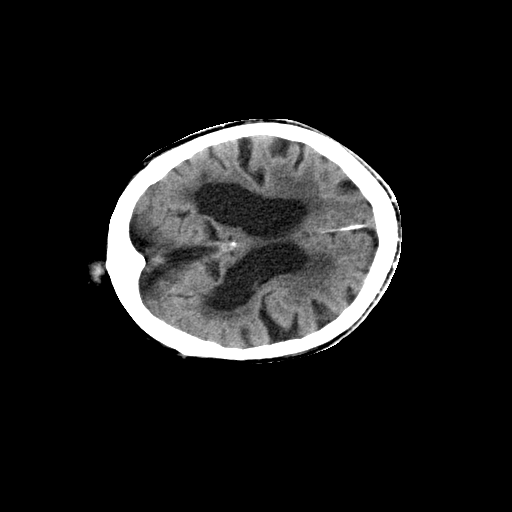
[im 24/39  bone]
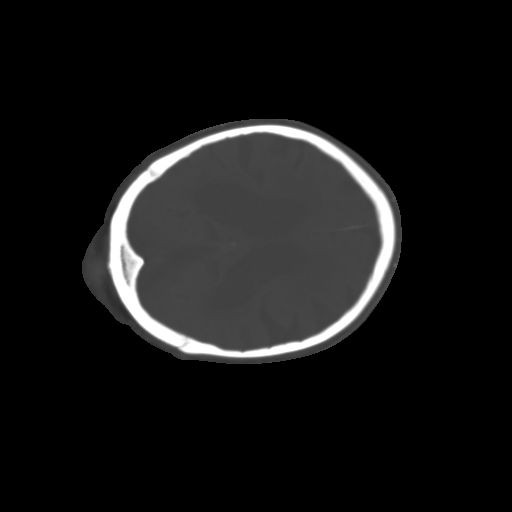
[im 29/39  brain]
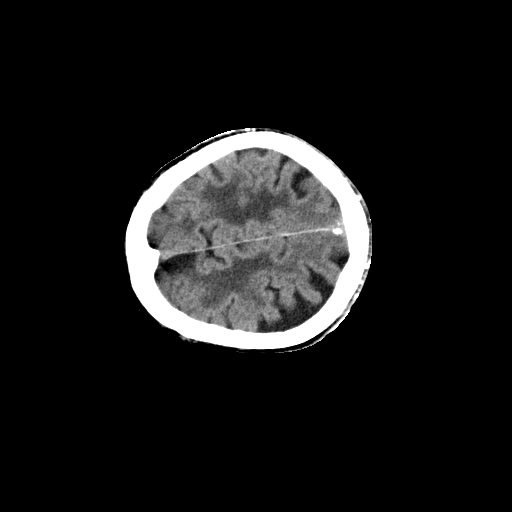
[im 34/39  brain]
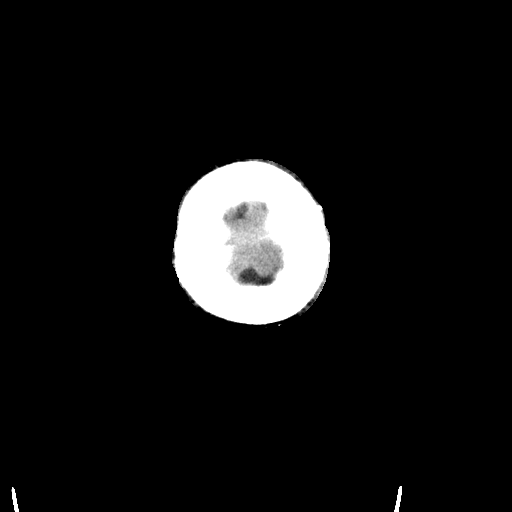

[Series 6: head bone · axial · 0.58mm/px · z∈[-250,-116]mm · 7 of 97 slices shown]
[im 10/97  bone]
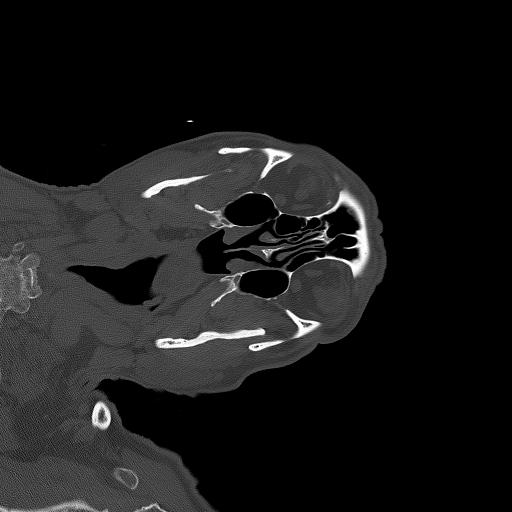
[im 20/97  bone]
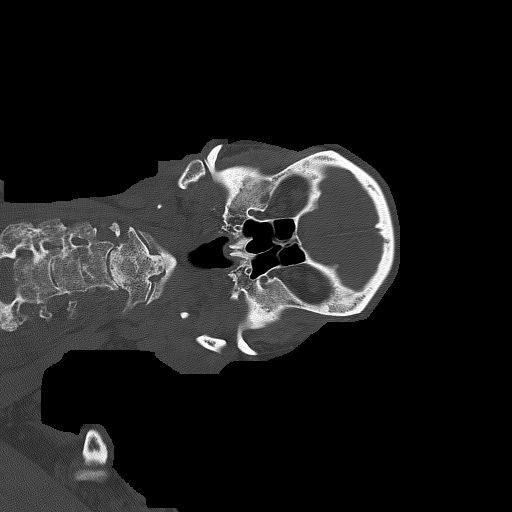
[im 29/97  bone]
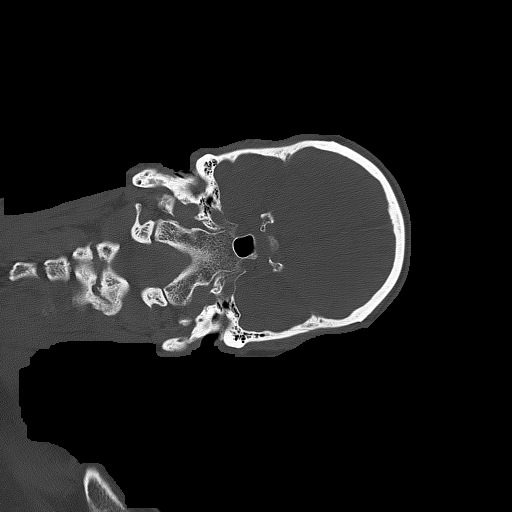
[im 44/97  bone]
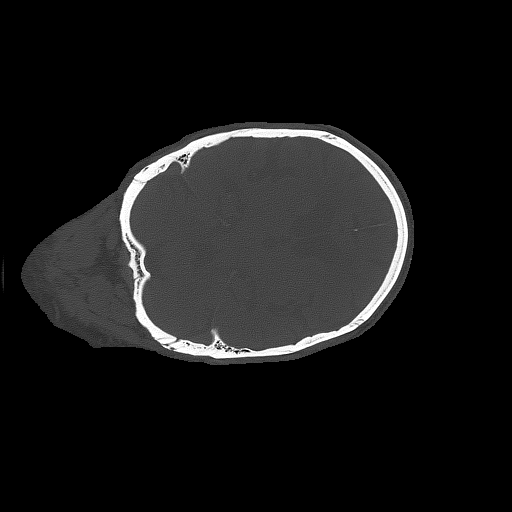
[im 53/97  bone]
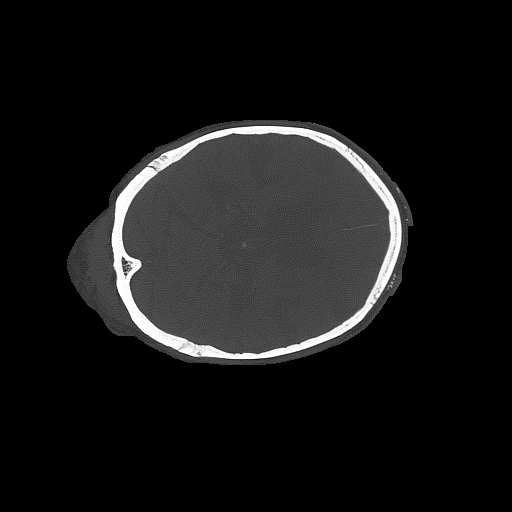
[im 68/97  bone]
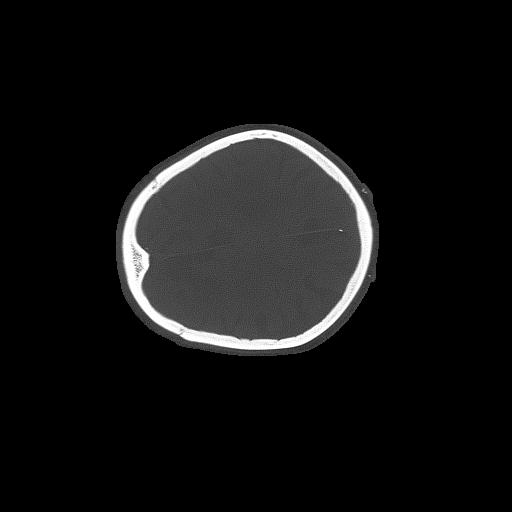
[im 77/97  bone]
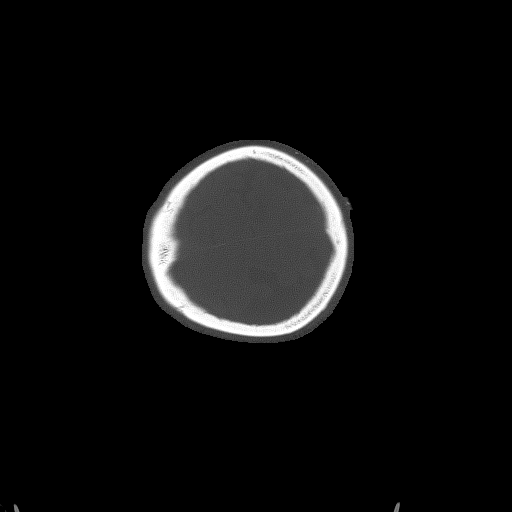

[Series 7: sagittal · sagittal · 0.35mm/px · 3 of 56 slices shown]
[im 19/56  brain]
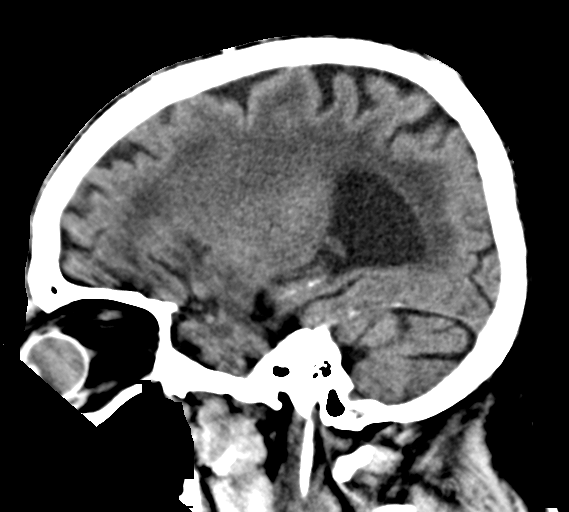
[im 28/56  brain]
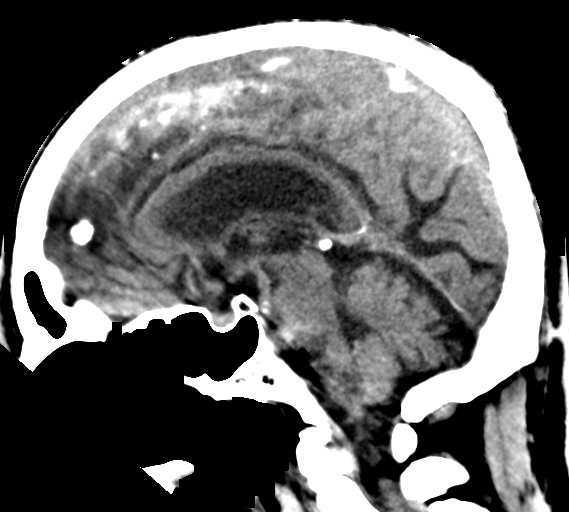
[im 37/56  brain]
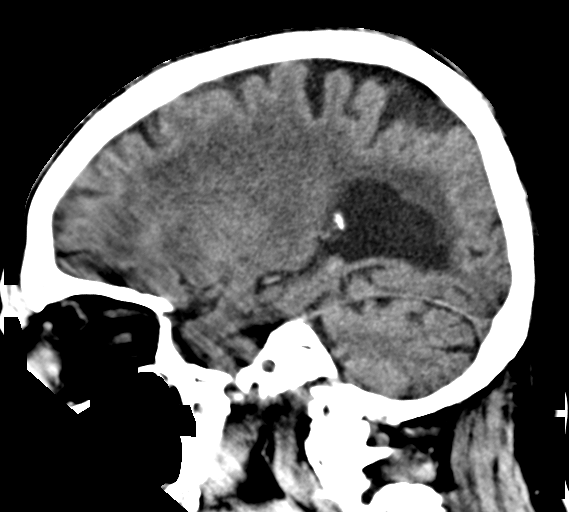

[17 of 37 positions shown; findings below may reference images not displayed]

FINDINGS: Brain: Confluent hypodensities throughout the periventricular and
subcortical white matter are again identified, unchanged since prior
exam and consistent with chronic small vessel ischemic changes. The
acute infarct within the left frontal subcortical white matter on
prior MRI is not readily apparent on CT. Chronic ischemic changes
are also seen within the bilateral cerebellar hemispheres and left
occipital lobe.

No new infarct or hemorrhage. Lateral ventricles and midline
structures are stable. No acute extra-axial fluid collections. No
mass effect.

Vascular: No hyperdense vessel or unexpected calcification.

Skull: Normal. Negative for fracture or focal lesion.

Sinuses/Orbits: No acute finding.

Other: None.
IMPRESSION: 1. Chronic small-vessel ischemic changes throughout the bilateral
periventricular and subcortical white matter. The acute left frontal
subcortical white matter infarcts seen on recent MRI are not readily
apparent by CT.
2. Chronic cortical infarcts involving the left occipital lobe and
bilateral cerebellar hemispheres.
3. No new infarct or acute hemorrhage.

## 2021-11-26 MED ORDER — ASPIRIN EC 81 MG PO TBEC
81.0000 mg | DELAYED_RELEASE_TABLET | Freq: Every day | ORAL | Status: DC
  Administered 2021-11-26 – 2021-11-28 (×3): 81 mg via ORAL
  Filled 2021-11-26 (×3): qty 1

## 2021-11-26 MED ORDER — BOOST / RESOURCE BREEZE PO LIQD CUSTOM
1.0000 | Freq: Three times a day (TID) | ORAL | Status: DC
  Administered 2021-11-27 – 2021-11-28 (×5): 1 via ORAL

## 2021-11-26 NOTE — Progress Notes (Signed)
°  Progress Note   Patient: George Bolton WIO:[id number] DOB: 1941/04/23 DOA: 11/22/2021     4 DOS: the patient was seen and examined on 11/26/2021     Assessment and Plan: * Acute embolic stroke [facility]) MRI of the brain shows an acute infarct on the left frontal lobe without evidence of hemorrhage.  Eliquis on hold as per neurology for 3 days.  Started aspirin.  Order lipid panel for tomorrow.  Already on Lipitor.  Community acquired bilateral lower lobe pneumonia Continue Rocephin and Zithromax  Acute kidney injury superimposed on chronic kidney disease ([place])- (present on admission) Acute kidney injury on chronic kidney disease stage IIIa.  Baseline creatinine around 1.3.  Today's creatinine 3.05.  Holding on IV fluids with fluid overload.  Continue to monitor.  Chronic diastolic congestive heart failure ([place])- (present on admission) Fluid overload with IV fluids.  Echocardiogram showed a normal EF.  Atrial fibrillation ([place])- (present on admission) Holding Eliquis as per neurology for 3 days.  Gout- (present on admission) Continue allopurinol  Metabolic acidemia Secondary to acute kidney injury on chronic kidney disease stage IIIa.    Subjective: The patient does not feel well but cannot elaborate on how he is feeling.  Last night MRI showing an acute stroke.  We were unable to do ultrasound of the carotids today secondary to his positioning.  Initially admitted with weakness.  Physical Exam: Vitals:   11/26/21 0042 11/26/21 0456 11/26/21 0756 11/26/21 0757  BP: (!) 145/99 (!) 145/104 129/90   Pulse: (!) 102 97 (!) 113 97  Resp: 18 20 15    Temp: 98.3 F (36.8 C) 98.4 F (36.9 C) 98.3 F (36.8 C)   TempSrc: Oral Oral Oral   SpO2: 94% 97% 96% 98%  Weight:      Height:       Physical Exam HENT:     Head: Normocephalic.     Mouth/Throat:     Pharynx: No oropharyngeal exudate.  Eyes:     General: Lids are normal.     Conjunctiva/sclera: Conjunctivae normal.   Cardiovascular:     Rate and Rhythm: Normal rate and regular rhythm.     Heart sounds: Normal heart sounds, S1 normal and S2 normal.  Pulmonary:     Breath sounds: No decreased breath sounds, wheezing, rhonchi or rales.  Abdominal:     Palpations: Abdomen is soft.     Tenderness: There is no abdominal tenderness.  Musculoskeletal:     Right lower leg: Swelling present.     Left lower leg: Swelling present.  Skin:    General: Skin is warm.     Findings: No rash.  Neurological:     Mental Status: He is alert.     Data Reviewed: I personally reviewed MRI of the brain from last night and laboratory data today.  Reviewed previous notes by specialist and my associates.  Family Communication: Spoke with patient's wife on the phone  Disposition: Status is: Inpatient Remains inpatient appropriate because: Still undergoing stroke work-up.  TOC to start process of looking into skilled nursing facilities.  Planned Discharge Destination: Skilled nursing facility   Author: Loletha Grayer, MD 11/26/2021 12:55 PM  For on call review [email or web address].

## 2021-11-26 NOTE — Progress Notes (Signed)
[facility]  ROUNDING NOTE   Subjective:   Patient resting in bed, drowsy Son at bedside Poor appetite continues with a few bites taken from each meal Son states his George Bolton has taken excessive doses of Aspirin for yeats, 1 bottle of aspirin a month, dosage unknown. George Bolton has arthritic pain.   Creatinine 3.05 Urine output 3.65L in past 24 hours  Objective:  Vital signs in last 24 hours:  Temp:  [97.5 F (36.4 C)-98.6 F (37 C)] 98.3 F (36.8 C) (02/01 0756) Pulse Rate:  [97-128] 97 (02/01 0757) Resp:  [15-20] 15 (02/01 0756) BP: (115-169)/(90-113) 129/90 (02/01 0756) SpO2:  [94 %-98 %] 98 % (02/01 0757)  Weight change:  Filed Weights   11/21/21 1800  Weight: 79.4 kg    Intake/Output: I/O last 3 completed shifts: In: 20 [P.O.:990] Out: 5250 [Urine:5250]   Intake/Output this shift:  Total I/O In: -  Out: 400 [Urine:400]  Physical Exam: General: Drowsy  Head: Normocephalic, atraumatic. Moist oral mucosal membranes  Eyes: Anicteric  Lungs:  Clear to auscultation, normal effort, room air  Heart: Regular rate and rhythm  Abdomen:  Soft, nontender, nondistended  Extremities: No peripheral edema.  Neurologic: Alert, moving all four extremities  Skin: No lesions       Basic Metabolic Panel: Recent Labs  Lab 11/22/21 0515 11/23/21 0613 11/24/21 0625 11/25/21 0517 11/26/21 0428  NA 136 136 135 137 138  K 4.6 4.8 4.7 4.1 4.5  CL 109 109 107 107 107  CO2 19* 21* 18* 21* 20*  GLUCOSE 148* 95 105* 103* 124*  BUN 58* 55* 47* 42* 43*  CREATININE 3.63* 3.29* 2.97* 3.02* 3.05*  CALCIUM 8.9 8.7* 8.9 8.7* 8.9  MG  --   --   --  2.2 2.2  PHOS  --   --   --   --  3.1     Liver Function Tests: No results for input(s): AST, ALT, ALKPHOS, BILITOT, PROT, ALBUMIN in the last 168 hours. No results for input(s): LIPASE, AMYLASE in the last 168 hours. No results for input(s): AMMONIA in the last 168 hours.  CBC: Recent Labs  Lab 11/21/21 1803  11/22/21 0515 11/23/21 0613 11/26/21 0428  WBC 17.3* 12.1* 8.8 11.8*  NEUTROABS  --   --  6.7 9.6*  HGB 14.8 14.1 11.9* 13.0  HCT 46.0 43.2 36.9* 39.7  MCV 85.3 83.1 84.2 83.2  PLT 198 170 167 240     Cardiac Enzymes: No results for input(s): CKTOTAL, CKMB, CKMBINDEX, TROPONINI in the last 168 hours.  BNP: Invalid input(s): POCBNP  CBG: Recent Labs  Lab 11/25/21 0830 11/25/21 1245 11/25/21 1601 11/25/21 2236 11/26/21 0757  GLUCAP 111* 130* 141* 88 109*     Microbiology: Results for orders placed or performed during the hospital encounter of 11/22/21  Resp Panel by RT-PCR (Flu A&B, Covid) Nasopharyngeal Swab     Status: None   Collection Time: 11/22/21  1:00 AM   Specimen: Nasopharyngeal Swab; Nasopharyngeal(NP) swabs in vial transport medium  Result Value Ref Range Status   SARS Coronavirus 2 by RT PCR NEGATIVE NEGATIVE Final    Comment: (NOTE) SARS-CoV-2 target nucleic acids are NOT DETECTED.  The SARS-CoV-2 RNA is generally detectable in upper respiratory specimens during the acute phase of infection. The lowest concentration of SARS-CoV-2 viral copies this assay can detect is 138 copies/mL. A negative result does not preclude SARS-Cov-2 infection and should not be used as the sole basis for treatment or other patient  management decisions. A negative result may occur with  improper specimen collection/handling, submission of specimen other than nasopharyngeal swab, presence of viral mutation(s) within the areas targeted by this assay, and inadequate number of viral copies(<138 copies/mL). A negative result must be combined with clinical observations, patient history, and epidemiological information. The expected result is Negative.  Fact Sheet for Patients:  EntrepreneurPulse.com.au  Fact Sheet for Healthcare Providers:  IncredibleEmployment.be  This test is no t yet approved or cleared by the [place] FDA and  has  been authorized for detection and/or diagnosis of SARS-CoV-2 by FDA under an Emergency Use Authorization (EUA). This EUA will remain  in effect (meaning this test can be used) for the duration of the COVID-19 declaration under Section 564(b)(1) of the Act, 21 U.S.C.section 360bbb-3(b)(1), unless the authorization is terminated  or revoked sooner.       Influenza A by PCR NEGATIVE NEGATIVE Final   Influenza B by PCR NEGATIVE NEGATIVE Final    Comment: (NOTE) The Xpert Xpress SARS-CoV-2/FLU/RSV plus assay is intended as an aid in the diagnosis of influenza from Nasopharyngeal swab specimens and should not be used as a sole basis for treatment. Nasal washings and aspirates are unacceptable for Xpert Xpress SARS-CoV-2/FLU/RSV testing.  Fact Sheet for Patients: EntrepreneurPulse.com.au  Fact Sheet for Healthcare Providers: IncredibleEmployment.be  This test is not yet approved or cleared by the [place] FDA and has been authorized for detection and/or diagnosis of SARS-CoV-2 by FDA under an Emergency Use Authorization (EUA). This EUA will remain in effect (meaning this test can be used) for the duration of the COVID-19 declaration under Section 564(b)(1) of the Act, 21 U.S.C. section 360bbb-3(b)(1), unless the authorization is terminated or revoked.  Performed at [facility], [address] [place], [place] [address]     Coagulation Studies: No results for input(s): LABPROT, INR in the last 72 hours.  Urinalysis: No results for input(s): COLORURINE, LABSPEC, PHURINE, GLUCOSEU, HGBUR, BILIRUBINUR, KETONESUR, PROTEINUR, UROBILINOGEN, NITRITE, LEUKOCYTESUR in the last 72 hours.  Invalid input(s): APPERANCEUR    Imaging: CT HEAD WO CONTRAST (5MM)  Result Date: 11/25/2021 CLINICAL DATA:  Generalized weakness. EXAM: CT HEAD WITHOUT CONTRAST TECHNIQUE: Contiguous axial images were obtained from the base of the skull through the  vertex without intravenous contrast. RADIATION DOSE REDUCTION: This exam was performed according to the departmental dose-optimization program which includes automated exposure control, adjustment of the mA and/or kV according to patient size and/or use of iterative reconstruction technique. COMPARISON:  April 19, 2020. 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 No mass effect or midline shift is noted. Ventricular size is within normal limits. There is no evidence of mass lesion, hemorrhage or acute infarction. Vascular: No hyperdense vessel or unexpected calcification. Skull: Normal. Negative for fracture or focal lesion. Sinuses/Orbits: No acute finding. Other: None. IMPRESSION: No acute intracranial abnormality seen. Electronically Signed   By: Marijo Conception M.D.   On: 11/25/2021 11:53   MR BRAIN WO CONTRAST  Result Date: 11/25/2021 CLINICAL DATA:  Left-sided weakness, stroke suspected EXAM: MRI HEAD WITHOUT CONTRAST TECHNIQUE: Multiplanar, multiecho pulse sequences of the brain and surrounding structures were obtained without intravenous contrast. COMPARISON:  No prior MRI correlation is made with CT head 11/25/2021 FINDINGS: Evaluation is somewhat limited by motion and patient inability to cooperate, with a truncated exam obtained. Only axial and coronal diffusion-weighted sequences, axial FLAIR, and susceptibility weighted images were obtained. Within this limitation, restricted diffusion  with ADC correlate in the left frontal lobe (series 9, images 32-35). No other restricted diffusion. No acute hemorrhage, mass, mass effect, or midline shift. Ventriculomegaly, which appears in proportion to sulcal size, likely reflecting greater than expected cerebral volume loss for age. Confluent T2 hyperintense signal in the periventricular white matter, likely the sequela of severe chronic small vessel ischemic disease. Sequela of remote left  occipital infarct. 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 No extra-axial collection. No significant sinus mucosal thickening or fluid in the mastoid air cells. Evaluation the orbits is limited by motion. No diffusion restricting calvarial lesion. IMPRESSION: Evaluation is somewhat limited by motion and patient inability to cooperate with the exam, with only certain sequences obtained. 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 No significant mass effect or midline shift. These results will be called to the ordering clinician or representative by the Radiologist Assistant, and communication documented in the PACS or [employer]. Electronically Signed   By: Merilyn Baba M.D.   On: 11/25/2021 22:39     Medications:    cefTRIAXone (ROCEPHIN)  IV Stopped (11/25/21 1320)    allopurinol  50 mg Oral Daily   aspirin EC  81 mg Oral Daily   atorvastatin  40 mg Oral QPM   azithromycin  500 mg Oral Daily   cloNIDine  0.1 mg Oral BID   famotidine  10 mg Oral Daily   insulin aspart  0-9 Units Subcutaneous TID AC & HS   isosorbide mononitrate  30 mg Oral Daily   metoprolol tartrate  50 mg Oral BID   acetaminophen **OR** acetaminophen, magnesium hydroxide, ondansetron **OR** ondansetron (ZOFRAN) IV, traZODone  Assessment/ Plan:  Mr. George Bolton is a 81 y.o.  male with previous medical history of chronic diastolic CHF, hypertension, atrial fibrillation, and gout.  Patient has been admitted for Dehydration [E86.0] Elevated troponin [R77.8] Generalized weakness [R53.1] Acute renal failure, unspecified acute renal failure type ([place]) [N17.9] Acute kidney injury superimposed on chronic kidney disease ([place]) [N17.9, N18.9] Leukocytosis, unspecified type [D72.829]   Acute Kidney Injury on chronic kidney disease stage IIIa with baseline creatinine 1.3 and GFR of 53 on 07/01/21.  Acute kidney injury secondary  to dehydration and volume depletion Renal ultrasound shows no obstruction of bilateral renal cyst.  Furosemide currently held No acute indication of dialysis at this time.  Creatinine stable today with adequate urine output. Encourage oral intake and will order Boost between meals  Lab Results  Component Value Date   CREATININE 3.05 (H) 11/26/2021   CREATININE 3.02 (H) 11/25/2021   CREATININE 2.97 (H) 11/24/2021    Intake/Output Summary (Last 24 hours) at 11/26/2021 1201 Last data filed at 11/26/2021 0759 Gross per 24 hour  Intake 790 ml  Output 4050 ml  Net -3260 ml    2.  Home regimen includes clonidine, furosemide, hydralazine, isosorbide, and metoprolol.  Currently receiving all medications except furosemide.  BP 129/90  3.  Chronic atrial fibrillation.  Remains on home regimen of Eliquis.   4.  Chronic diastolic heart failure.  Echo on 11/22/2021 shows EF of 60 to 65% with moderate concentric left ventricular hypertrophy.  Left ventricular diastolic parameters indeterminate.    LOS: [place] 2/1/202312:01 PM

## 2021-11-26 NOTE — Progress Notes (Signed)
Occupational Therapy Treatment Patient Details Name: George Bolton MRN: [id number] DOB: May 30, 1941 Today's Date: 11/26/2021   History of present illness Pt is an 81 y.o. M admitted on 11/22/21 after presenting to the ED with acute onset of generalized weakness with diminished p.o. intake & abnormal labs. 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 Pt also has bilateral lower lobe CAP. 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 PMH: chronic a-fib, chronic diastolic CHF, gout, HTN.   OT comments  Pt seen for OT treatment on this date. Upon arrival to room, pt asleep in bed but easily awoken. Pt with mumbled speech, responds to name, but is disoriented to place and situation. Difficult to formally assess pt's strength, sensation, and vision this date d/t pt inconsistently following 1-step instructions. Pt currently requires MOD A for supine>sit transfer and MOD A for UB dressing d/t decreased strength and impaired cognition. Attempted sit>stand transfer this date, but pt unable to initiate and complete following MAX A of 1-person. Pt required MAX A+2 to return to supine. Once supine in bed, pt immediately fell asleep. Pt was left in no acute distress and with all needs within reach. Pt continues to benefit from skilled OT services to maximize return to PLOF and minimize risk of future falls, injury, caregiver burden, and readmission. Discharge recommendation remains appropriate.   Recommendations for follow up therapy are one component of a multi-disciplinary discharge planning process, led by the attending physician.  Recommendations may be updated based on patient status, additional functional criteria and insurance authorization.    Follow Up Recommendations  Skilled nursing-short term rehab (<3 hours/day)    Assistance Recommended at Discharge Frequent  or constant Supervision/Assistance  Patient can return home with the following  Two people to help with walking and/or transfers;A lot of help with bathing/dressing/bathroom;Assistance with cooking/housework   Equipment Recommendations  Other (comment) (defer to next venue of care)       Precautions / Restrictions Precautions Precautions: Fall Restrictions Weight Bearing Restrictions: No       Mobility Bed Mobility Overal bed mobility: Needs Assistance Bed Mobility: Supine to Sit, Sit to Supine     Supine to sit: Mod assist, HOB elevated Sit to supine: Max assist, +2 for physical assistance (+2 to scoot toward HOB)        Transfers Overall transfer level: Needs assistance Equipment used: Rolling walker (2 wheels) Transfers: Sit to/from Stand Sit to Stand: Max assist, From elevated surface           General transfer comment: Pt unable to clear hips from bed following MAX A of 1-person     Balance Overall balance assessment: Needs assistance Sitting-balance support: No upper extremity supported, Feet supported Sitting balance-Leahy Scale: Fair Sitting balance - Comments: steady static sitting although pt leaning back at times to rest against pillows propped up to support pt Postural control: Posterior lean Standing balance support: Bilateral upper extremity supported, During functional activity Standing balance-Leahy Scale: Zero                             ADL either performed or assessed with clinical judgement   ADL Overall ADL's : Needs assistance/impaired                 Upper Body Dressing : Moderate assistance;Sitting Upper Body Dressing  Details (indicate cue type and reason): to don/doff hospital gown                         Vision   Additional Comments: unable to provide formal assessment in setting of cognition          Cognition Arousal/Alertness: Awake/alert Behavior During Therapy: WFL for tasks  assessed/performed Overall Cognitive Status: Difficult to assess                                 General Comments: Pt with mumbled speech and moaning, responds to name, but requires increased time/cues for following 1-step instructions. Disoriented to place and situation                   Pertinent Vitals/ Pain       Pain Assessment Pain Assessment: Faces Faces Pain Scale: Hurts a little bit Pain Location: L knee Pain Descriptors / Indicators: Discomfort, Aching Pain Intervention(s): Limited activity within patient's tolerance, Monitored during session, Repositioned         Frequency  Min 2X/week        Progress Toward Goals  OT Goals(current goals can now be found in the care plan section)  Progress towards OT goals: Progressing toward goals  Acute Rehab OT Goals Patient Stated Goal: to go home OT Goal Formulation: With patient Time For Goal Achievement: 12/07/21 Potential to Achieve Goals: Good  Plan Discharge plan needs to be updated;Frequency needs to be updated       AM-PAC OT "6 Clicks" Daily Activity     Outcome Measure   Help from another person eating meals?: A Little Help from another person taking care of personal grooming?: A Lot Help from another person toileting, which includes using toliet, bedpan, or urinal?: A Lot Help from another person bathing (including washing, rinsing, drying)?: A Lot Help from another person to put on and taking off regular upper body clothing?: A Lot Help from another person to put on and taking off regular lower body clothing?: A Lot 6 Click Score: 13    End of Session Equipment Utilized During Treatment: Rolling walker (2 wheels);Gait belt  OT Visit Diagnosis: Unsteadiness on feet (R26.81);Muscle weakness (generalized) (M62.81);Pain Pain - Right/Left: Left Pain - part of body: Knee   Activity Tolerance Patient tolerated treatment well   Patient Left in bed;with call bell/phone within reach;with  bed alarm set   Nurse Communication Mobility status        Time: 1116-1140 OT Time Calculation (min): 24 min  Charges: OT General Charges $OT Visit: 1 Visit OT Treatments $Self Care/Home Management : 23-37 mins  Fredirick Maudlin, OTR/L [place]

## 2021-11-26 NOTE — Progress Notes (Signed)
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 No significant mass effect or midline shift." Patient presented to ED 11/22/21 for generalized weakness. PT/OT noticed that patient has become significantly weaker over the past two days. Neurology is consulted and following case.    Neomia Glass MHA, MSN, FNP-BC Nurse Practitioner [facility] Pager [phone]

## 2021-11-26 NOTE — Plan of Care (Signed)
PMT note:  Patient is resting in bed . No family at bedside. Per nursing, family was here earlier. Attempted to call his wife unsuccessfully. Will follow up tomorrow.

## 2021-11-26 NOTE — Assessment & Plan Note (Addendum)
Completed Rocephin and Zithromax during the hospital course

## 2021-11-26 NOTE — Care Management Important Message (Signed)
Important Message  Patient Details  Name: BEE HAMMERSCHMIDT MRN: [id number] Date of Birth: [birth year]   Medicare Important Message Given:  Yes     Juliann Pulse A Jeanise Durfey 11/26/2021, 10:51 AM

## 2021-11-26 NOTE — Assessment & Plan Note (Signed)
Secondary to acute kidney injury on chronic kidney disease stage IIIa.

## 2021-11-26 NOTE — Assessment & Plan Note (Addendum)
Acute kidney injury on chronic kidney disease stage IIIa.  Baseline creatinine around 1.3.  Today's creatinine 2.8 today.  Yesterday's creatinine was 3.14.  Will need follow-up with nephrology as outpatient.

## 2021-11-26 NOTE — Assessment & Plan Note (Addendum)
Fluid overload with IV fluids.  Echocardiogram showed a normal EF.  Continue to hold Lasix as outpatient.  Continue metoprolol.

## 2021-11-26 NOTE — Assessment & Plan Note (Addendum)
Acute gout right knee.  Continue colchicine low-dose and allopurinol.  As needed Tylenol for pain.  The patient refused prednisone.

## 2021-11-26 NOTE — Assessment & Plan Note (Addendum)
MRI of the brain shows an acute infarct on the left frontal lobe without evidence of hemorrhage.  Eliquis can be restarted this evening.  Discontinuing aspirin.  LDL 34.  Change Lipitor over to Crestor

## 2021-11-26 NOTE — Evaluation (Signed)
Clinical/Bedside Swallow Evaluation Patient Details  Name: George Bolton MRN: [id number] Date of Birth: October 02, 1941  Today's Date: 11/26/2021 Time: SLP Start Time (ACUTE ONLY): 91 SLP Stop Time (ACUTE ONLY): 1150 SLP Time Calculation (min) (ACUTE ONLY): 60 min  Past Medical History:  Past Medical History:  Diagnosis Date   Chronic atrial fibrillation ([place])    a. Afib dates back to at least 2002 when reviewing prior EKG with EKGs from 2014 onward showing Afib; b. CHADS2VASc at least 4 (CHF, HTN, age x 2)   Chronic diastolic CHF (congestive heart failure) ([place])    a. TTE 5/19: EF 70-75%, mod LVH, near complete obliteration of the mid and apical LV cavity (can be seen in apical hypertrophic CM), mildly dilated LA, RV cavity size nl, RV wall thickness nl, RVSF mildly reduced, mildly dilated RA; b. 02/2018 Limited echo w/ definity: EF 50-65%, no apical hypertrophic CM.   Gout    Hypertension    Past Surgical History:  Past Surgical History:  Procedure Laterality Date   CHOLECYSTECTOMY N/A 01/13/2017   Procedure: LAPAROSCOPIC CHOLECYSTECTOMY;  Surgeon: Jules Husbands, MD;  Location: [facility] ORS;  Service: General;  Laterality: N/A;   HPI:  Pt is an 81 y.o. M admitted on 11/22/21 after presenting to the ED with acute onset of generalized weakness with diminished p.o. intake & abnormal labs. 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 Pt also has bilateral lower lobe CAP.  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  CXR: Cardiomegaly. Poor inspiration. There are no signs of pulmonary  edema or focal pulmonary consolidation; bibasilar atelectasis or edema. PMH: chronic a-fib, chronic diastolic CHF, gout, HTN. Unsure of pt's Baseline Cognitive status -- Son endorsed s/s of decline at home in functional State overall including mumbled/muttered speech (ongoing  for years).    Assessment / Plan / Recommendation  Clinical Impression  Pt seen for BSE this date. Upon arrival to room, pt  calling out/moaning w/ eyes closed in bed but easily awoken. Pt with muttered/mumbled speech, responded to name, but is disoriented to place, time, and situation. Pt inconsistently followed basic 1-step instructions. Pt required MAX assist for Left side>supine/sit transfer. 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 Unsure of pt's Baseline Cognitive status.   Per brief visit w/ Son around [place], he stated pt's Muttered/Mumbled speech has been ongoing at home for the past several years. He also gave description of others issues as well; drawing up in a "ball-like" position, not lifting his head up, sleeping a lot of the day leaned over/forward. Son stated that pt's LUE has been weak w/ decreased use/sensation for "years". Son also reported they have been cutting/chopping his foods for easier mastication in recent times.  Pt appears to present w/ oral phase dysphagia in light of declined Cognitive status; unsure of Baseline status. This can impact overall awareness/timing of swallow and safety during po tasks which increases risk for aspiration, choking. Pt's risk for aspiration is present but can be reduced when following general aspiration precautions and using a modified diet consistency of Minced foods, moistened as well as Supervision w/ all oral intake to ensure for sitting Fully upright and oral clearing.   Pt consumed only few trials each of ice chips, purees, soft solids and thin liquids  b/f refusing further. He was mildly agitated w/ having to take po's w/ this Clinician and w/ having to sit Up in bed for po intake. No overt clinical s/s of aspiration noted w/ trials; No immediate cough, no decline in vocal quality or in respiratory status during/post trials. Pt demonstrated Oral Prep  confusion w/ Straw use requiring support; then w/ spoon use requiring tactile stim at lips w/ spoon to open mouth.  Oral phase was c/b mildly decreased oral awareness and bolus management and oral clearing of the soft solid boluses given. Mastication of soft solids was c/b Munching pattern w/ scattered residue(min) -- this was also noted when pt attempted to swallow Pills WHOLE w/ Puree w/ NSG in room. Further tsps of puree were required to aid oral clearing of the Pill tablets he chewed first instead of swallowed.  Pt did not attempted to self-feeding; required MAX support and guidance w/ feeding d/t Cognitive decline. He often had his eyes closed. OM Exam appeared grossly [facility] w/ No unilateral (L or R) oral weakness noted. No anterior leakage; no drooling. Confusion of OM tasks and oral care apparent.     D/t pt's declined Cognitive presentation/status, and risk for aspiration d/t oral phase dysphagia, recommend modifying diet to a Dysphagia level 2(minced foods w/ gravies to moisten) w/ thin liquids; general aspiration precautions; reduce Distractions during meals and engage pt during po's at meal for self-feeding. Pills Crushed in Puree for safer swallowing. Support w/ feeding at meals. MUST be sitting fully Upright for any oral intake. MD/NSG updated. ST services recommends follow w/ Palliative Care for [place] and education re: impact of any Cognitive decline on swallowing and overall oral intake. SLP Visit Diagnosis: Dysphagia, oral phase (R13.11) (Cognitive decline apparent in his engagement)    Aspiration Risk  Mild aspiration risk;Risk for inadequate nutrition/hydration    Diet Recommendation   Dysphagia level 2(minced foods w/ gravies to moisten) w/ thin liquids; general aspiration precautions including checking for oral clearing as needed; reduce Distractions during meals and engage pt during po's at meal for self-feeding. Support w/ feeding at meals. MUST be sitting fully Upright for any oral intake.    Medication Administration: Crushed with puree (for safer swallowing -- pt chewed the pills w/ NSG)    Other  Recommendations Recommended Consults:  (Dietician f/u; Palliative Care f/u) Oral Care Recommendations: Oral care BID;Oral care before and after PO;Staff/trained caregiver to provide oral care Other Recommendations:  (n/a)    Recommendations for follow up therapy are one component of a multi-disciplinary discharge planning process, led by the attending physician.  Recommendations may be updated based on patient status, additional functional criteria and insurance authorization.  Follow up Recommendations No SLP follow up (unless significant improvement in overall alertness and engagement for consideration of diet upgrade to full solids). Pt can be followed at next venue of care if needs indicated.      Assistance Recommended at Discharge Frequent or constant Supervision/Assistance  Functional Status Assessment Patient has had a recent decline in their functional status and/or demonstrates limited ability to make significant improvements in function in a reasonable and predictable amount of time  Frequency and Duration  (n/a)   (n/a)       Prognosis Prognosis for Safe Diet Advancement: Fair Barriers to Reach Goals: Cognitive deficits;Language deficits;Time post onset;Severity of deficits;Behavior;Motivation Barriers/Prognosis Comment: Cognitive decline      Swallow Study   General Date of Onset: 11/22/21 HPI: Pt is an 81 y.o. M admitted on 11/22/21  after presenting to the ED with acute onset of generalized weakness with diminished p.o. intake & abnormal labs. 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 Pt also has bilateral lower lobe CAP.  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  CXR: Cardiomegaly. Poor inspiration. There are no  signs of pulmonary  edema or focal pulmonary consolidation; bibasilar atelectasis or edema. PMH: chronic a-fib, chronic diastolic CHF, gout, HTN. Unsure of pt's Baseline Cognitive status -- Son endorsed s/s of decline at home in functional State overall including mumbled/muttered speech (ongoing for years). Type of Study: Bedside Swallow Evaluation Previous Swallow Assessment: none Diet Prior to this Study: Regular;Thin liquids Temperature Spikes Noted: No (wbc 11.8) Respiratory Status: Room air History of Recent Intubation: No Behavior/Cognition: Alert;Confused;Uncooperative;Distractible;Requires cueing;Doesn't follow directions (Eyes closed) Oral Cavity Assessment: Within Functional Limits Oral Care Completed by SLP: Yes (attempted) Oral Cavity - Dentition: Poor condition;Missing dentition (missing most) Vision:  (unsure) Self-Feeding Abilities: Total assist Patient Positioning: Postural control adequate for testing (much positioning support required) Baseline Vocal Quality: Low vocal intensity (mumbled/muttered) Volitional Cough: Cognitively unable to elicit Volitional Swallow: Unable to elicit    Oral/Motor/Sensory Function Overall Oral Motor/Sensory Function:  (no unilateral weakness overtly apparent this morning -- pt leaning to left side somewhat)   Ice Chips Ice chips: Impaired Presentation: Spoon (fed; 2 trials) Oral Phase Impairments: Poor awareness of bolus Oral Phase Functional Implications: Prolonged oral transit Pharyngeal Phase Impairments:  (none)   Thin Liquid Thin Liquid: Impaired (min - oral prep) Presentation: Straw (~6 ozs total) Oral Phase Impairments: Poor awareness of bolus Pharyngeal  Phase Impairments:  (none) Other Comments: oral prep deficits    Nectar Thick Nectar Thick Liquid: Not tested   Honey Thick Honey Thick Liquid: Not tested   Puree Puree: Within functional limits Presentation: Spoon (fed; 8 trials+)   Solid     Solid: Impaired Presentation:  Spoon (fed; 3 trials accepted) Oral Phase Impairments: Reduced lingual movement/coordination;Poor awareness of bolus;Impaired mastication Oral Phase Functional Implications: Prolonged oral transit;Impaired mastication;Oral residue Pharyngeal Phase Impairments:  (none) Other Comments: eyes closed; fidgity; muttered speech        Orinda Kenner, MS, CCC-SLP Speech Language Pathologist Rehab Services; [place] [phone] (ascom) Jasaun Carn 11/26/2021,5:22 PM

## 2021-11-26 NOTE — Assessment & Plan Note (Addendum)
Restart Eliquis this evening and discontinue aspirin

## 2021-11-26 NOTE — Consult Note (Addendum)
Addendum 2/1 1313: Despite an initial report that he may have been combative it appears this may not have been the case. He was not combative with me and has not required any prn medications to manage his behavior.   Su Monks, MD [facility] [phone]  If 7pm- 7am, please page neurology on call as listed in [place].   NEUROLOGY CONSULTATION NOTE   Date of service: November 26, 2021 Patient Name: George Bolton MRN:  [id number] DOB:  10/31/1940 Reason for consult: concern for acute stroke Requesting physician: Dr. Roosevelt Locks _ _ _   _ __   _ __ _ _  __ __   _ __   __ _  History of Present Illness   This is an 81 yo man with pmhx significant for chronic a fib on eliquis, diastolic CHF, HTN who is admitted for AKI. On my examination patient does have some mild R sided weakness and numbness, although he would not cooperate with most of the examination and was therefore difficult to assess. He has been combative with staff. Head CT this afternoon showed no ICH (personal review). Patient will not provide additional history but PT/OT felt there has been a significant change over the past 2 days.   ROS   Pt refuses to cooperate with ROS  Past History   I have reviewed the following:  Past Medical History:  Diagnosis Date   Chronic atrial fibrillation ([place])    a. Afib dates back to at least 2002 when reviewing prior EKG with EKGs from 2014 onward showing Afib; b. CHADS2VASc at least 4 (CHF, HTN, age x 2)   Chronic diastolic CHF (congestive heart failure) ([place])    a. TTE 5/19: EF 70-75%, mod LVH, near complete obliteration of the mid and apical LV cavity (can be seen in apical hypertrophic CM), mildly dilated LA, RV cavity size nl, RV wall thickness nl, RVSF mildly reduced, mildly dilated RA; b. 02/2018 Limited echo w/ definity: EF 50-65%, no apical hypertrophic CM.   Gout    Hypertension    Past Surgical History:  Procedure Laterality Date   CHOLECYSTECTOMY N/A 01/13/2017    Procedure: LAPAROSCOPIC CHOLECYSTECTOMY;  Surgeon: Jules Husbands, MD;  Location: [facility] ORS;  Service: General;  Laterality: N/A;   Family History  Problem Relation Age of Onset   Stroke Mother    Social History   Socioeconomic History   Marital status: Married    Spouse name: Not on file   Number of children: Not on file   Years of education: Not on file   Highest education level: Not on file  Occupational History   Not on file  Tobacco Use   Smoking status: Never   Smokeless tobacco: Never  Vaping Use   Vaping Use: Never used  Substance and Sexual Activity   Alcohol use: No   Drug use: No   Sexual activity: Not on file  Other Topics Concern   Not on file  Social History Narrative   Not on file   Social Determinants of Health   Financial Resource Strain: Not on file  Food Insecurity: Not on file  Transportation Needs: Not on file  Physical Activity: Not on file  Stress: Not on file  Social Connections: Not on file   Allergies  Allergen Reactions   Cortisone Anaphylaxis   Triamcinolone     Other reaction(s): Unknown    Medications   Medications Prior to Admission  Medication Sig Dispense Refill Last Dose  apixaban (ELIQUIS) 2.5 MG TABS tablet Take 2.5 mg by mouth 2 (two) times daily.   11/21/2021   aspirin 325 MG tablet Take 650 mg by mouth daily.   11/21/2021   atorvastatin (LIPITOR) 40 MG tablet Take 1 tablet by mouth daily.   11/21/2021   cloNIDine (CATAPRES) 0.1 MG tablet Take 0.1 mg by mouth 2 (two) times daily.   Past Week   famotidine (PEPCID) 20 MG tablet Take 1 tablet (20 mg total) by mouth 2 (two) times daily. 60 tablet 0 11/21/2021   felodipine (PLENDIL) 10 MG 24 hr tablet Take 10 mg by mouth daily.   Past Week   furosemide (LASIX) 40 MG tablet Take 1 tablet (40 mg total) by mouth daily as needed for fluid.   11/21/2021 at prn   hydrALAZINE (APRESOLINE) 25 MG tablet Take 25 mg by mouth 3 (three) times daily.   Past Week   isosorbide mononitrate (IMDUR)  30 MG 24 hr tablet Take 30 mg by mouth daily.   Past Week   metoprolol tartrate (LOPRESSOR) 100 MG tablet Take 1 tablet (100 mg total) by mouth 2 (two) times daily. (Patient taking differently: Take 50 mg by mouth 2 (two) times daily.) 60 tablet 2 11/21/2021   allopurinol (ZYLOPRIM) 100 MG tablet Take 50 mg by mouth daily. (Patient not taking: Reported on 06/29/2021)   Not Taking   colchicine 0.6 MG tablet Take 0.6 mg by mouth daily as needed (gout flares). (Patient not taking: Reported on 11/22/2021)   Not Taking      Current Facility-Administered Medications:    acetaminophen (TYLENOL) tablet 650 mg, 650 mg, Oral, Q6H PRN **OR** acetaminophen (TYLENOL) suppository 650 mg, 650 mg, Rectal, Q6H PRN, Mansy, Jan A, MD   allopurinol (ZYLOPRIM) tablet 50 mg, 50 mg, Oral, Daily, Sharen Hones, MD, 50 mg at 11/25/21 0850   atorvastatin (LIPITOR) tablet 40 mg, 40 mg, Oral, QPM, Sharen Hones, MD, 40 mg at 11/25/21 1719   azithromycin (ZITHROMAX) tablet 500 mg, 500 mg, Oral, Daily, Sharen Hones, MD, 500 mg at 11/25/21 0850   cefTRIAXone (ROCEPHIN) 1 g in sodium chloride 0.9 % 100 mL IVPB, 1 g, Intravenous, Q24H, Sharen Hones, MD, Stopped at 11/25/21 1320   cloNIDine (CATAPRES) tablet 0.1 mg, 0.1 mg, Oral, BID, Sharen Hones, MD, 0.1 mg at 11/25/21 2233   famotidine (PEPCID) tablet 10 mg, 10 mg, Oral, Daily, Sharen Hones, MD, 10 mg at 11/25/21 0850   insulin aspart (novoLOG) injection 0-9 Units, 0-9 Units, Subcutaneous, TID AC & HS, Mansy, Jan A, MD, 1 Units at 11/25/21 1719   isosorbide mononitrate (IMDUR) 24 hr tablet 30 mg, 30 mg, Oral, Daily, Sharen Hones, MD, 30 mg at 11/25/21 0850   magnesium hydroxide (MILK OF MAGNESIA) suspension 30 mL, 30 mL, Oral, Daily PRN, Mansy, Jan A, MD   metoprolol tartrate (LOPRESSOR) tablet 50 mg, 50 mg, Oral, BID, Sharen Hones, MD, 50 mg at 11/25/21 2233   ondansetron (ZOFRAN) tablet 4 mg, 4 mg, Oral, Q6H PRN **OR** ondansetron (ZOFRAN) injection 4 mg, 4 mg, Intravenous, Q6H  PRN, Mansy, Jan A, MD   traZODone (DESYREL) tablet 25 mg, 25 mg, Oral, QHS PRN, Mansy, Jan A, MD, 25 mg at 11/25/21 2236  Vitals   Vitals:   11/25/21 1823 11/25/21 2229 11/26/21 0042 11/26/21 0456  BP: (!) 140/101 (!) 169/113 (!) 145/99 (!) 145/104  Pulse: (!) 121 (!) 128 (!) 102 97  Resp: 20 20 18 20   Temp: (!) 97.5 F (36.4 C) 98.6  F (37 C) 98.3 F (36.8 C) 98.4 F (36.9 C)  TempSrc:  Oral Oral Oral  SpO2: 97% 97% 94% 97%  Weight:      Height:         Body mass index is 23.73 kg/m.  Physical Exam   Physical Exam Gen: alert, refuses to answer orientation questions, frequent expletives HEENT: Atraumatic, normocephalic;mucous membranes moist; oropharynx clear, tongue without atrophy or fasciculations. Neck: Supple, trachea midline. Resp: CTAB, no w/r/r CV: RRR, no m/g/r; nml S1 and S2. 2+ symmetric peripheral pulses. Abd: soft/NT/ND; nabs x 4 quad Extrem: Nml bulk; no cyanosis, clubbing, or edema.  Neuro: *MS: alert, refuses to answer orientation questions, frequent expletives *Speech: mild dysarthria, will not name or repeat *CN:    I: Deferred   II,III: PERRLA, VFF by confrontation, optic discs unable to be visualized 2/2 pupillary constriction   III,IV,VI: EOMI w/o nystagmus, no ptosis   V: Sensation intact from V1 to V3 to LT   VII: Eyelid closure was full.  R UMN facial droop   VIII: Hearing intact to voice   IX,X: Voice normal, palate elevates symmetrically    XI: SCM/trap 5/5 bilat   XII: Tongue protrudes midline, no atrophy or fasciculations   *Motor:   Normal bulk.  No tremor, rigidity or bradykinesia. Antigravity in all extremities, appears slightly weaker on R when resisting examination *Sensory: SILT. No double-simultaneous extinction.  *Coordination:  UTA *Reflexes:  2+ and symmetric throughout without clonus; toes down-going bilat *Gait: UTA  NIHSS = 3 for facial droop, RUE motor, RLE motor   Labs   CBC:  Recent Labs  Lab 11/23/21 0613  11/26/21 0428  WBC 8.8 11.8*  NEUTROABS 6.7 9.6*  HGB 11.9* 13.0  HCT 36.9* 39.7  MCV 84.2 83.2  PLT 167 245    Basic Metabolic Panel:  Lab Results  Component Value Date   NA 138 11/26/2021   K 4.5 11/26/2021   CO2 20 (L) 11/26/2021   GLUCOSE 124 (H) 11/26/2021   BUN 43 (H) 11/26/2021   CREATININE 3.05 (H) 11/26/2021   CALCIUM 8.9 11/26/2021   GFRNONAA 20 (L) 11/26/2021   GFRAA 41 (L) 09/24/2020   Lipid Panel: No results found for: LDLCALC HgbA1c:  Lab Results  Component Value Date   HGBA1C 6.2 (H) 11/22/2021   Urine Drug Screen: No results found for: LABOPIA, COCAINSCRNUR, LABBENZ, AMPHETMU, THCU, LABBARB  Alcohol Level No results found for: [facility]   Impression   PT/OT contacted myself and Dr. Roosevelt Locks that this patient admitted for AKI had become progressively weaker over the past few days and had L sensory deficits that were new. On my examination patient does have some mild L sided weakness and numbness, although he would not cooperate with most of the examination and was therefore difficult to assess. Head CT this afternoon showed no ICH.   Recommendations   - 2/2 concern for possible ischemic stroke, I have d/c'd his apixaban until we can obtain MRI brain overnight. - I have also d/c'd his hydralazine, and left metoprolol on for rate control for a fib. Avoid hypotension in the setting of possible new ischemic infarct.  - Will continue to follow ______________________________________________________________________   Thank you for the opportunity to take part in the care of this patient. If you have any further questions, please contact the neurology consultation attending.  Signed,  Su Monks, MD [facility] [phone]  If 7pm- 7am, please page neurology on call as listed in [place].  Addendum: MRI  brain w/ e/o acute infarct L frontal lobe. Continue to hold apixaban. Will order stroke workup.

## 2021-11-26 NOTE — Progress Notes (Signed)
S: Patient pleasant but lethargic today with worse dysarthria than yesterday. Repeat head CT showed no changes from yesterday, no blood products.  MRI brain revealed acute infarct L frontal lobe. Eliquis on hold in the setting of acute infarct. Started on ASA 81mg  daily. TTE 1/28 showed no intracardiac clot. Lipid panel pending. Carotid dopplers pending.  O:  Vitals:   11/26/21 1721 11/26/21 2037  BP: (!) 136/93 140/84  Pulse: 97 100  Resp: 18 20  Temp: 98.7 F (37.1 C) 97.9 F (36.6 C)  SpO2: 96% 99%   Gen: lethargic but follows commands CV: RRR Resp: normal WOB  Mental status: lethargic but follows commands, oriented to self and hospital Speech: moderate dysarthria, no aphasia CN: PERRL, blinks to threat bilat, EOMI, sensation intact, R UMN facial droop, hearing intact to voice Motor: LUE and LLE 4/5 diffusely, anti-gravity RUE, some movement but not against gravity RLE Sensation: SILT Reflexes: 1+ symmetric throughout Coordination: intact L FNF  A/P: 81 yo man with pmhx significant for chronic a fib on eliquis, diastolic CHF, HTN who is admitted for AKI new onset R sided weakness 1/31 with MRI showing acute L frontal infarct. - Permissive HTN x48 hrs from sx onset or until stroke ruled out by MRI goal BP <220/110. PRN labetalol or hydralazine if BP above these parameters. Hydralazine d/c'd; metoprolol left on for rate control - TTE completed 1/28 - Carotid dopplers pending - Plan to hold eliquis x3 days in the setting of acute infarct with repeat head CT r/o interval hemorrhagic conversion before anticoagulation restart. ASA 81mg  daily while off eliquis. Aspirin does not need to be continued after eliquis is restarted from a neuro standpoint particularly since it may increase bleeding risk; however patient was on both ASA and eliquis prior to admission and the indication for aspirin should be clarified (it may be continued if there is a cardiac indication to do so) - Check A1c and  LDL + add statin per guidelines - q4 hr neuro checks - STAT head CT for any change in neuro exam - Tele - PT/OT/SLP - Stroke education - Amb referral to neurology upon discharge  Will continue to follow.  Su Monks, MD [facility] [phone]  If 7pm- 7am, please page neurology on call as listed in [place].

## 2021-11-27 ENCOUNTER — Inpatient Hospital Stay: Payer: Medicare Other

## 2021-11-27 DIAGNOSIS — M109 Gout, unspecified: Secondary | ICD-10-CM

## 2021-11-27 DIAGNOSIS — R627 Adult failure to thrive: Secondary | ICD-10-CM

## 2021-11-27 DIAGNOSIS — Z515 Encounter for palliative care: Secondary | ICD-10-CM

## 2021-11-27 DIAGNOSIS — Z7189 Other specified counseling: Secondary | ICD-10-CM

## 2021-11-27 LAB — GLUCOSE, CAPILLARY
Glucose-Capillary: 111 mg/dL — ABNORMAL HIGH (ref 70–99)
Glucose-Capillary: 102 mg/dL — ABNORMAL HIGH (ref 70–99)
Glucose-Capillary: 215 mg/dL — ABNORMAL HIGH (ref 70–99)
Glucose-Capillary: 147 mg/dL — ABNORMAL HIGH (ref 70–99)
Glucose-Capillary: 157 mg/dL — ABNORMAL HIGH (ref 70–99)

## 2021-11-27 LAB — BASIC METABOLIC PANEL
Anion gap: 7 (ref 5–15)
BUN: 40 mg/dL — ABNORMAL HIGH (ref 8–23)
CO2: 20 mmol/L — ABNORMAL LOW (ref 22–32)
Calcium: 8.7 mg/dL — ABNORMAL LOW (ref 8.9–10.3)
Chloride: 111 mmol/L (ref 98–111)
Creatinine, Ser: 3.14 mg/dL — ABNORMAL HIGH (ref 0.61–1.24)
GFR, Estimated: 19 mL/min — ABNORMAL LOW (ref >60)
Glucose, Bld: 113 mg/dL — ABNORMAL HIGH (ref 70–99)
Potassium: 4.4 mmol/L (ref 3.5–5.1)
Sodium: 138 mmol/L (ref 135–145)

## 2021-11-27 LAB — SYNOVIAL CELL COUNT + DIFF, W/ CRYSTALS
Eosinophils-Synovial: 0 %
Eosinophils-Synovial: 0 %
Lymphocytes-Synovial Fld: 0 %
Lymphocytes-Synovial Fld: 0 %
Monocyte-Macrophage-Synovial Fluid: 6 %
Monocyte-Macrophage-Synovial Fluid: 16 %
Neutrophil, Synovial: 94 %
Neutrophil, Synovial: 84 %
WBC, Synovial: 18546 /mm3 — ABNORMAL HIGH (ref 0–200)
WBC, Synovial: 14370 /mm3 — ABNORMAL HIGH (ref 0–200)

## 2021-11-27 LAB — LIPID PANEL
Cholesterol: 65 mg/dL (ref 0–200)
HDL: 24 mg/dL — ABNORMAL LOW (ref >40)
LDL Cholesterol: 34 mg/dL (ref 0–99)
Total CHOL/HDL Ratio: 2.7 RATIO
Triglycerides: 37 mg/dL (ref <150)
VLDL: 7 mg/dL (ref 0–40)

## 2021-11-27 IMAGING — US US CAROTID DUPLEX BILAT
1 series · 13 of 24 positions shown · non-contrast
Comparison: None.

CLINICAL DATA: History of hypertension and stroke.

EXAM:
BILATERAL CAROTID DUPLEX ULTRASOUND
TECHNIQUE: Gray scale imaging, color Doppler and duplex ultrasound were
performed of bilateral carotid and vertebral arteries in the neck.

[Series 1: us carotid bilateral · 13 of 53 slices shown]
[im 1/53]
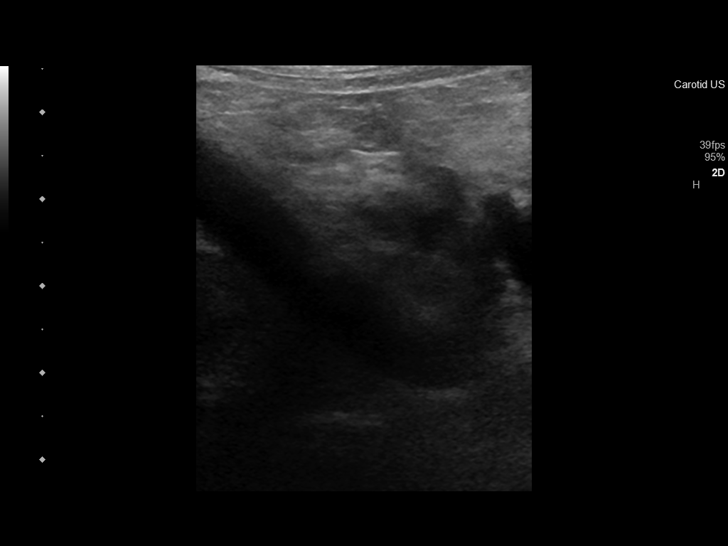
[im 5/53]
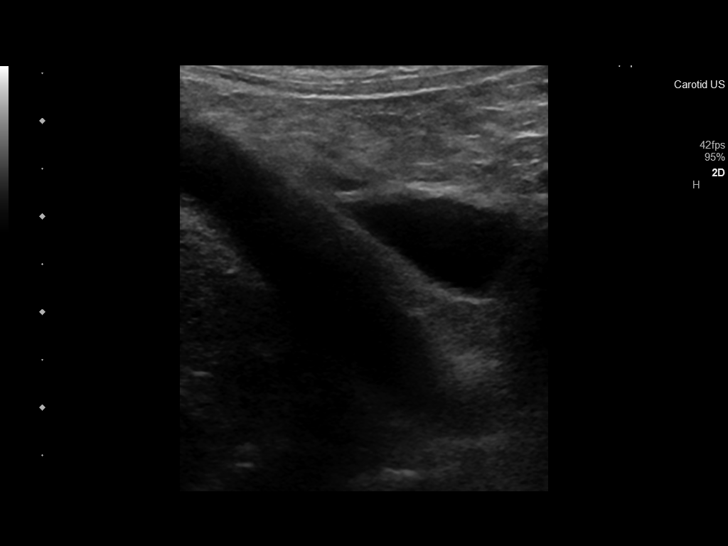
[im 10/53]
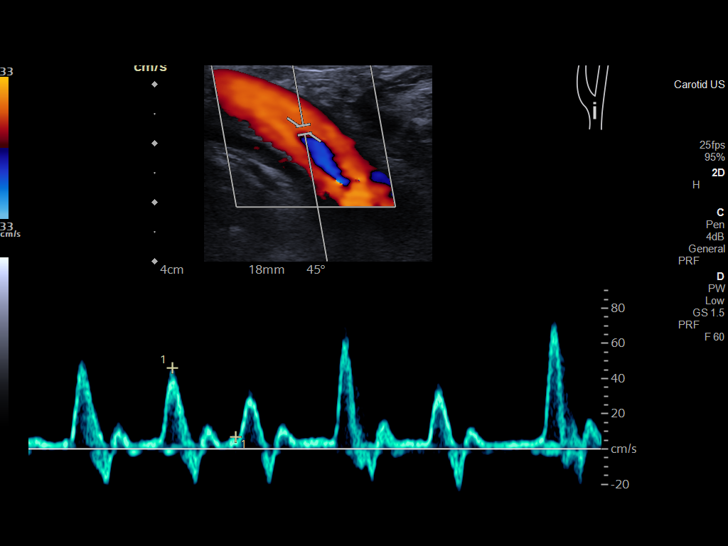
[im 14/53]
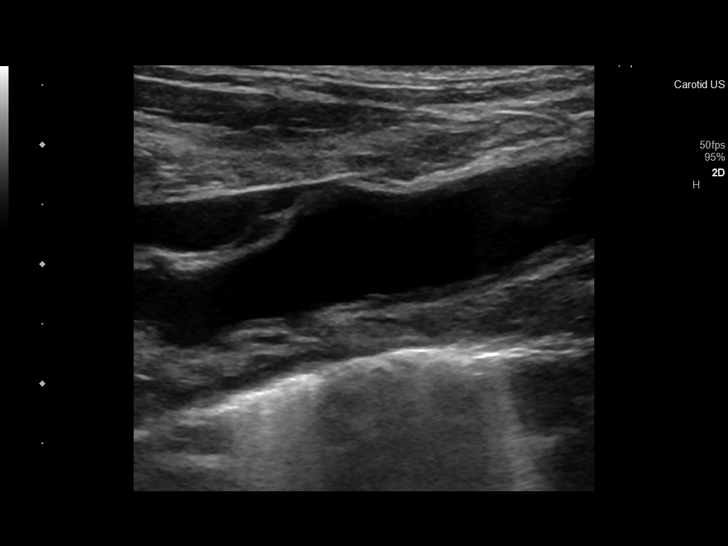
[im 19/53]
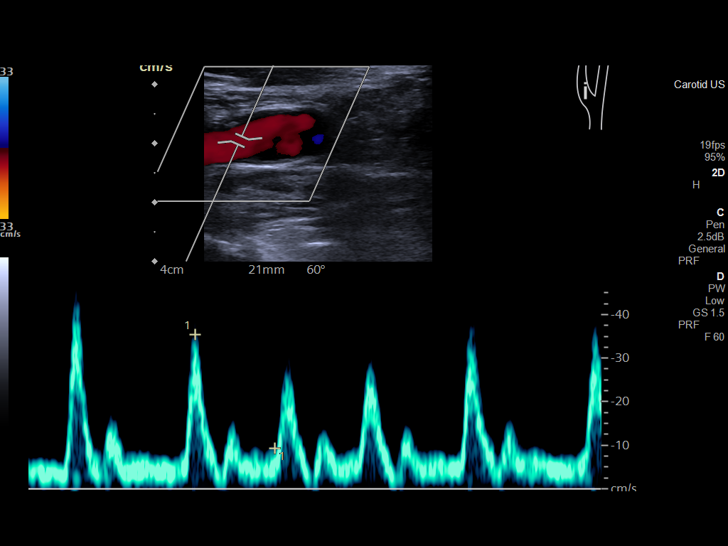
[im 23/53]
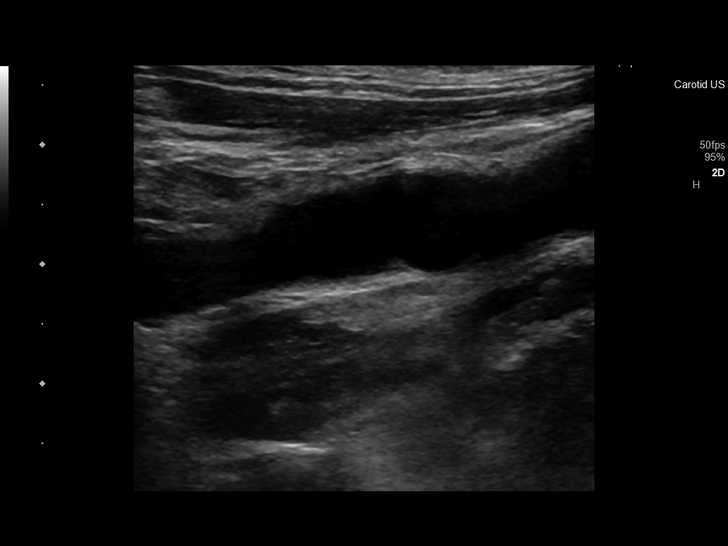
[im 28/53]
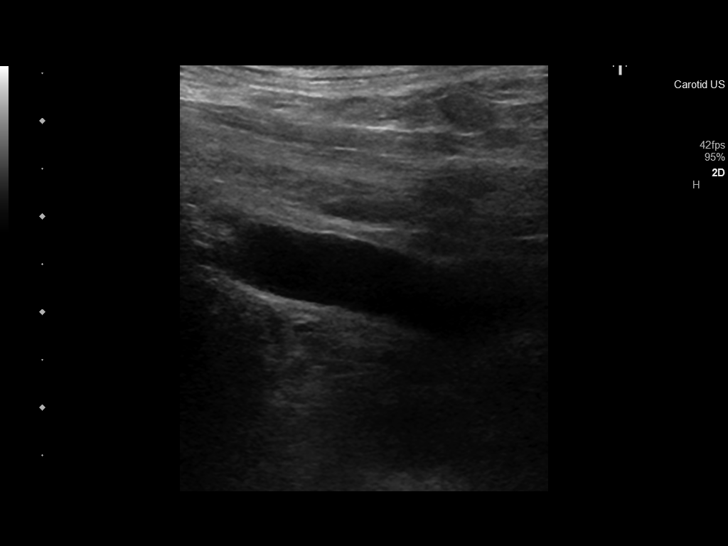
[im 30/53]
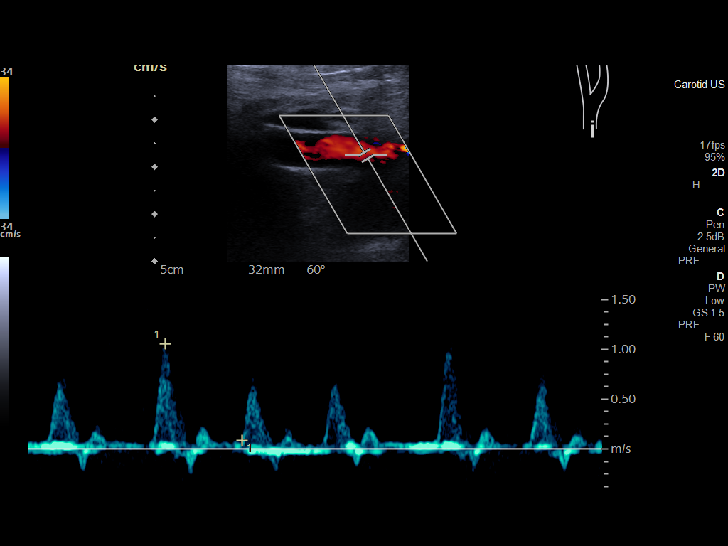
[im 34/53]
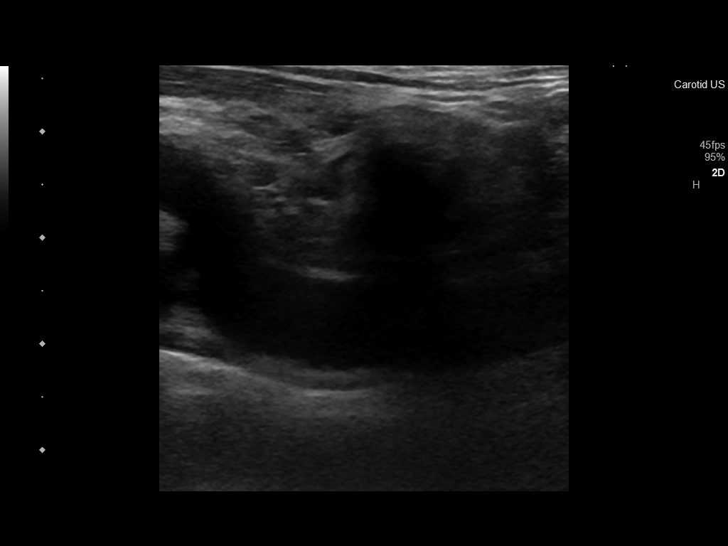
[im 39/53]
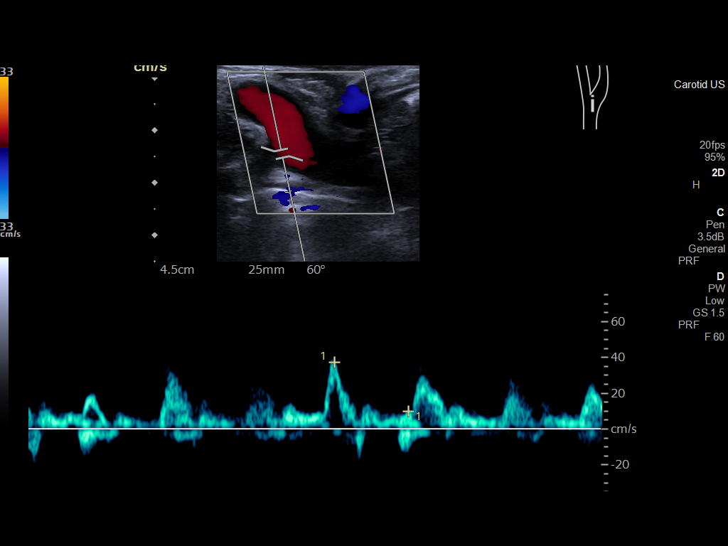
[im 43/53]
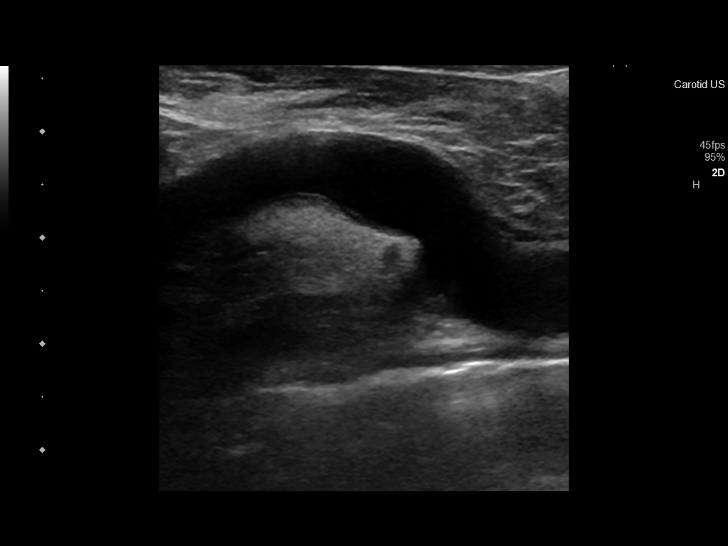
[im 48/53]
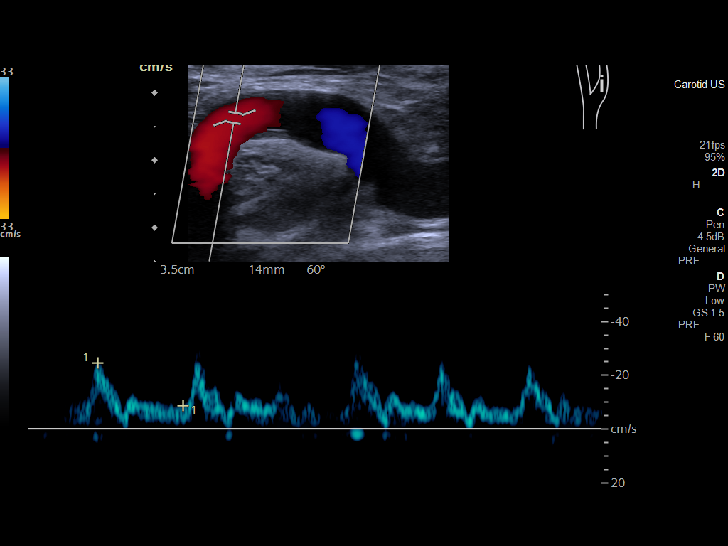
[im 53/53]
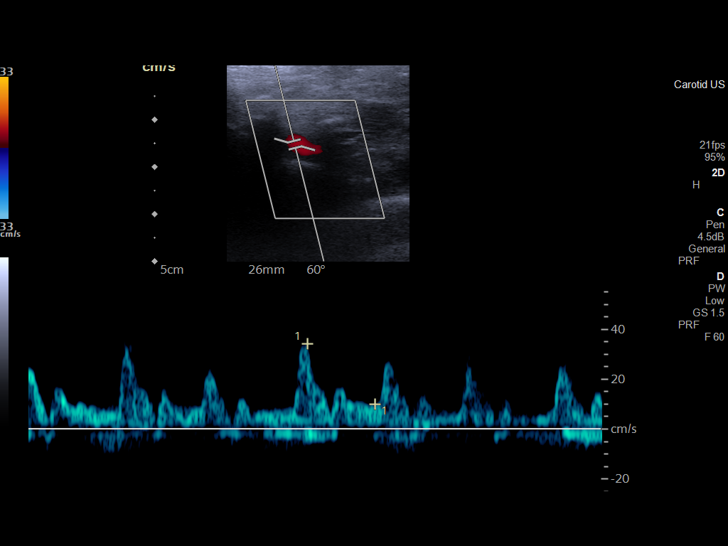

[13 of 24 positions shown; findings below may reference images not displayed]

FINDINGS: Examination is degraded secondary to patient's inability to tolerate
standard positioning.

Criteria: Quantification of carotid stenosis is based on velocity
parameters that correlate the residual internal carotid diameter
with NASCET-based stenosis levels, using the diameter of the distal
internal carotid lumen as the denominator for stenosis measurement.

The following velocity measurements were obtained:

RIGHT

ICA: 36/9 cm/sec

CCA: 46/7 cm/sec

SYSTOLIC ICA/CCA RATIO:

ECA: 52 cm/sec

LEFT

ICA: 87/15 cm/sec

CCA: 22/4 cm/sec

SYSTOLIC ICA/CCA RATIO:

ECA: 41 cm/sec

RIGHT CAROTID ARTERY: There is a minimal amount of intimal wall
thickening involving the left internal carotid artery, not resulting
in elevated peak systolic velocities within the interrogated course
of the left internal carotid artery to suggest a hemodynamically
significant stenosis.

RIGHT VERTEBRAL ARTERY:  Antegrade flow

LEFT CAROTID ARTERY: There is a very minimal amount of wall
thickening involving the left carotid bulb, origin and proximal
aspect of the left internal carotid artery, not resulting in
elevated peak systolic velocities within the interrogated course of
the left internal carotid artery to suggest a hemodynamically
significant stenosis.

LEFT VERTEBRAL ARTERY:  Antegrade flow
IMPRESSION: Very minimal moderate bilateral intimal wall thickening, not
resulting in a hemodynamically significant stenosis within either
internal carotid artery.

## 2021-11-27 IMAGING — MR MR KNEE*R* W/O CM
6 series · 40 of 40 positions shown · non-contrast
Comparison: None.

CLINICAL DATA: Pain and swelling.  Fell in [REDACTED] or MAIKLAS.

EXAM:
MRI OF THE RIGHT KNEE WITHOUT CONTRAST
TECHNIQUE: Multiplanar, multisequence MR imaging of the knee was performed. No
intravenous contrast was administered.

[Series 8: T2 fat-sat · axial · right · 4.0mm · 0.50mm/px · z∈[-68,+57]mm · 8 of 26 slices shown (1 of 3)]
[im 1/26]
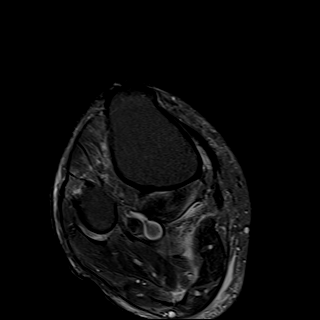
[im 4/26]
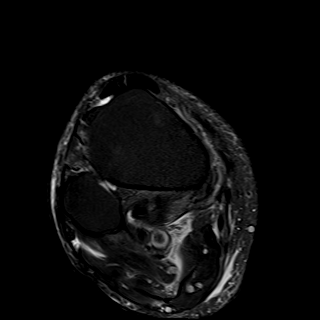
[im 8/26]
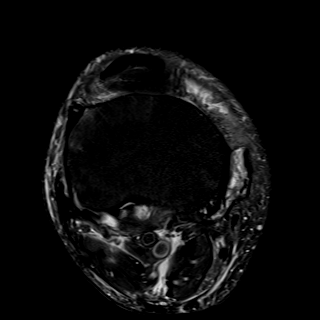
[im 11/26]
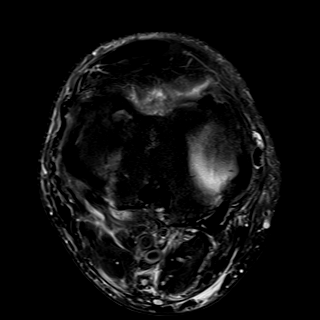
[im 15/26]
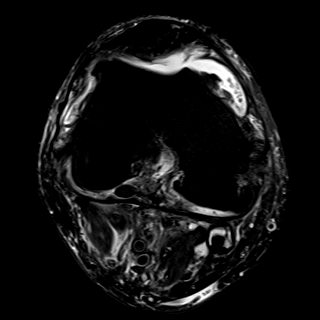
[im 18/26]
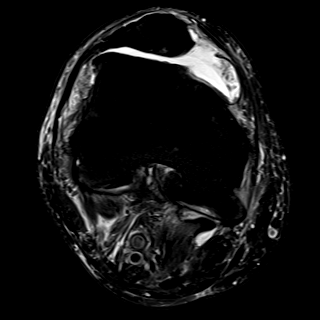
[im 22/26]
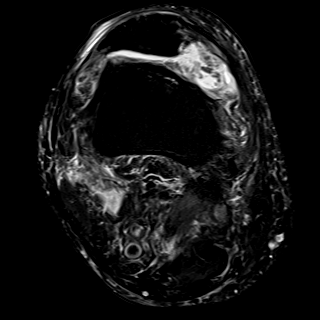
[im 26/26]
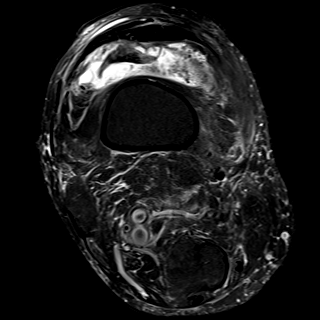

[Series 9: T1 · coronal · right · 4.0mm · 0.47mm/px · 6 of 24 slices shown]
[im 1/24]
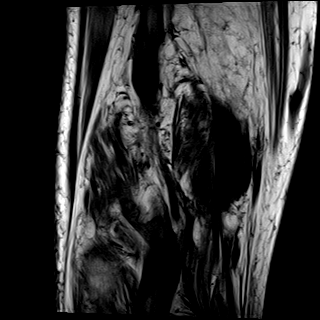
[im 5/24]
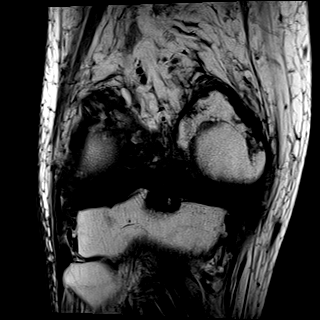
[im 10/24]
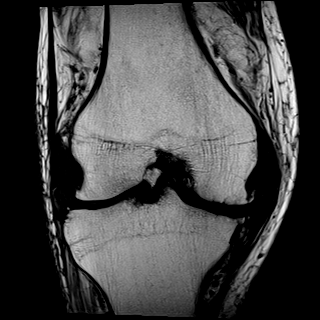
[im 14/24]
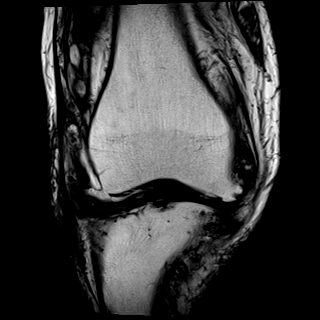
[im 19/24]
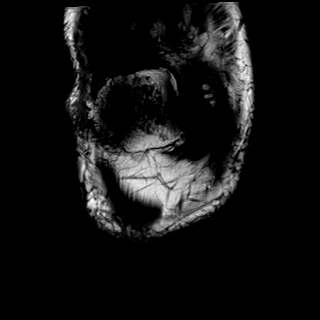
[im 24/24]
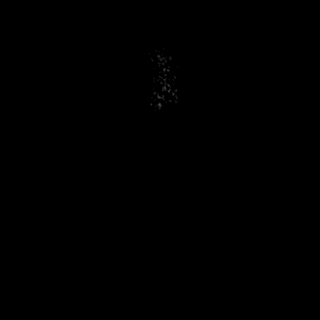

[Series 10: T2 fat-sat · coronal · right · 4.0mm · 0.59mm/px · 6 of 24 slices shown (2 of 3)]
[im 1/24]
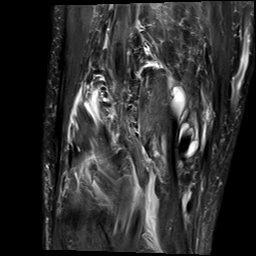
[im 5/24]
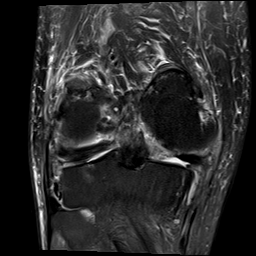
[im 10/24]
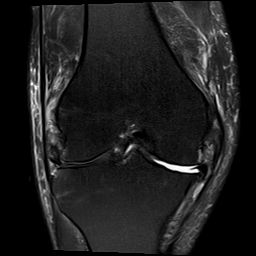
[im 14/24]
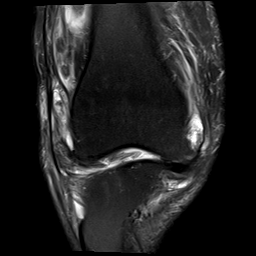
[im 19/24]
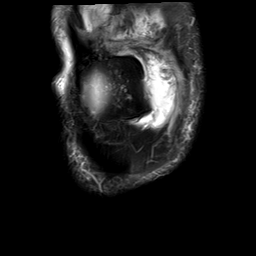
[im 24/24]
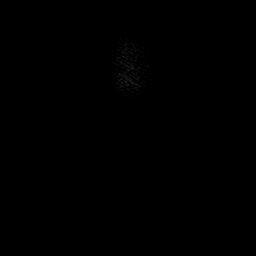

[Series 11: PD fat-sat · coronal · right · 4.0mm · 0.59mm/px · 6 of 24 slices shown (1 of 2)]
[im 1/24]
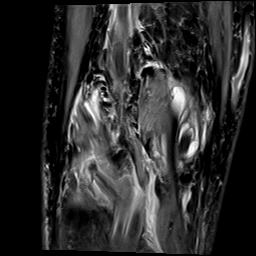
[im 5/24]
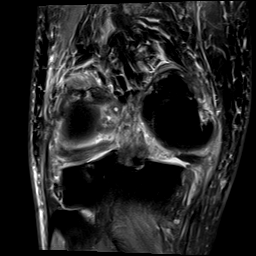
[im 10/24]
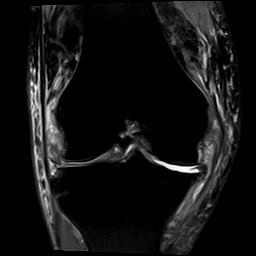
[im 14/24]
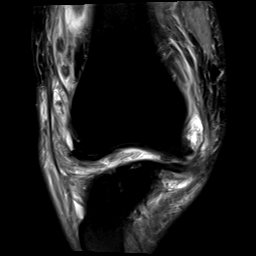
[im 19/24]
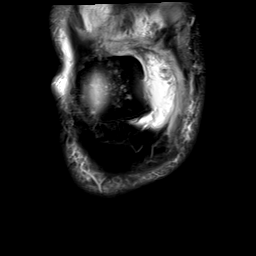
[im 24/24]
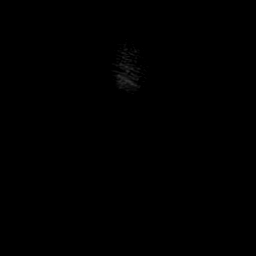

[Series 12: PD fat-sat · sagittal · right · 3.0mm · 0.59mm/px · 7 of 28 slices shown (2 of 2)]
[im 1/28]
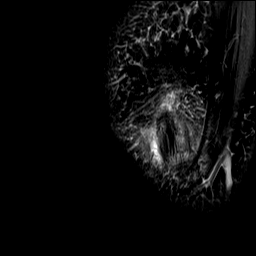
[im 5/28]
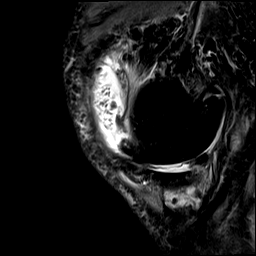
[im 10/28]
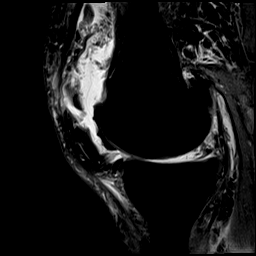
[im 14/28]
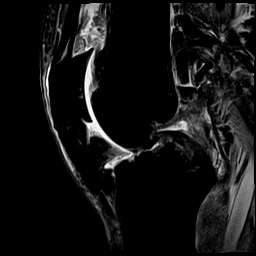
[im 19/28]
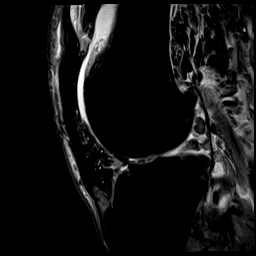
[im 23/28]
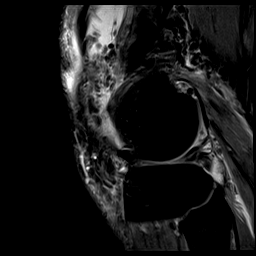
[im 28/28]
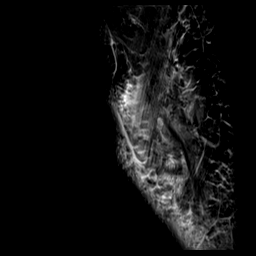

[Series 13: T2 fat-sat · sagittal · right · 3.0mm · 0.59mm/px · 7 of 28 slices shown (3 of 3)]
[im 1/28]
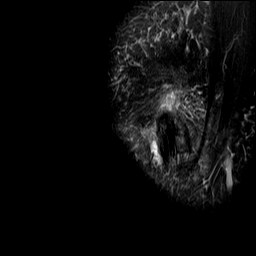
[im 5/28]
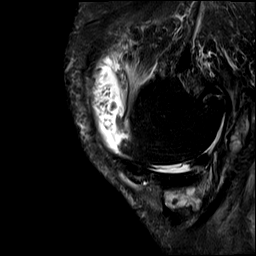
[im 10/28]
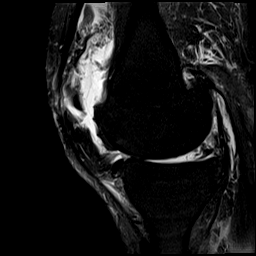
[im 14/28]
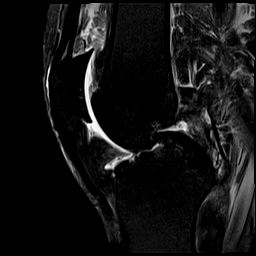
[im 19/28]
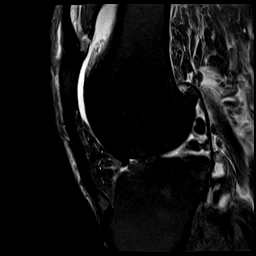
[im 23/28]
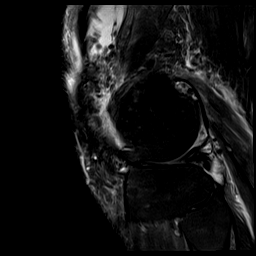
[im 28/28]
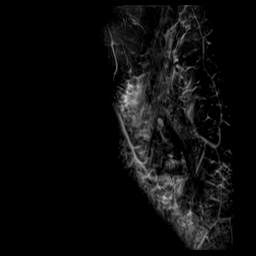

[40 of 40 positions shown; findings below may reference images not displayed]

FINDINGS: MENISCI

Medial: Maceration of the anterior horn-body junction of the medial
meniscus. Large complex tear of the posterior horn of the medial
meniscus with a radial component at the root of the posterior horn.

Lateral: Maceration of the anterior horn of the lateral meniscus.
Degeneration of the body and posterior horn of the lateral meniscus.
Oblique tear of the posterior horn of the lateral meniscus extending
to the inferior articular surface.

LIGAMENTS

Cruciates: Intact PCL.  Complete chronic ACL tear.

Collaterals: Thickening of the proximal MCL most consistent with
proximal MCL strain without a complete tear. Lateral collateral
ligament complex is intact.

CARTILAGE

Patellofemoral: Extensive full-thickness cartilage loss of the
patellofemoral compartment.

Medial: Extensive full-thickness cartilage loss of the medial
femorotibial compartment.

Lateral: High-grade partial-thickness cartilage loss with areas of
full-thickness cartilage loss of the lateral femorotibial
compartment.

JOINT: Moderate joint effusion. Prominence of synovial fat in the
suprapatellar joint space. Normal MAIKLAS. No plical
thickening.

POPLITEAL FOSSA: Mild tendinosis of the popliteus tendon. Small
Baker's cyst.

EXTENSOR MECHANISM: Intact quadriceps tendon. Intact patellar
tendon. Intact lateral patellar retinaculum. Intact medial patellar
retinaculum. Intact MPFL.

BONES: No aggressive osseous lesion. No fracture or dislocation.
Tricompartmental marginal osteophytes.

Other: No fluid collection or hematoma. Muscles are normal.
Generalized
IMPRESSION: 1. Severe tricompartmental cartilage abnormalities as described
above consistent with advanced tricompartmental osteoarthritis of
the right knee.
2. Proximal MCL strain without a complete tear.
3. Maceration of the anterior horn-body junction of the medial
meniscus. Large complex tear of the posterior horn of the medial
meniscus with a radial component at the root of the posterior horn.
4. Maceration of the anterior horn of the lateral meniscus.
Degeneration of the body and posterior horn of the lateral meniscus.
Oblique tear of the posterior horn of the lateral meniscus extending
to the inferior articular surface.
5. Complete chronic ACL tear.

## 2021-11-27 IMAGING — DX DG KNEE 1-2V*R*
2 series · 2 of 2 positions shown · non-contrast
Comparison: None.

CLINICAL DATA: Right knee pain.

EXAM:
RIGHT KNEE - 1-2 VIEW

[knee ap]
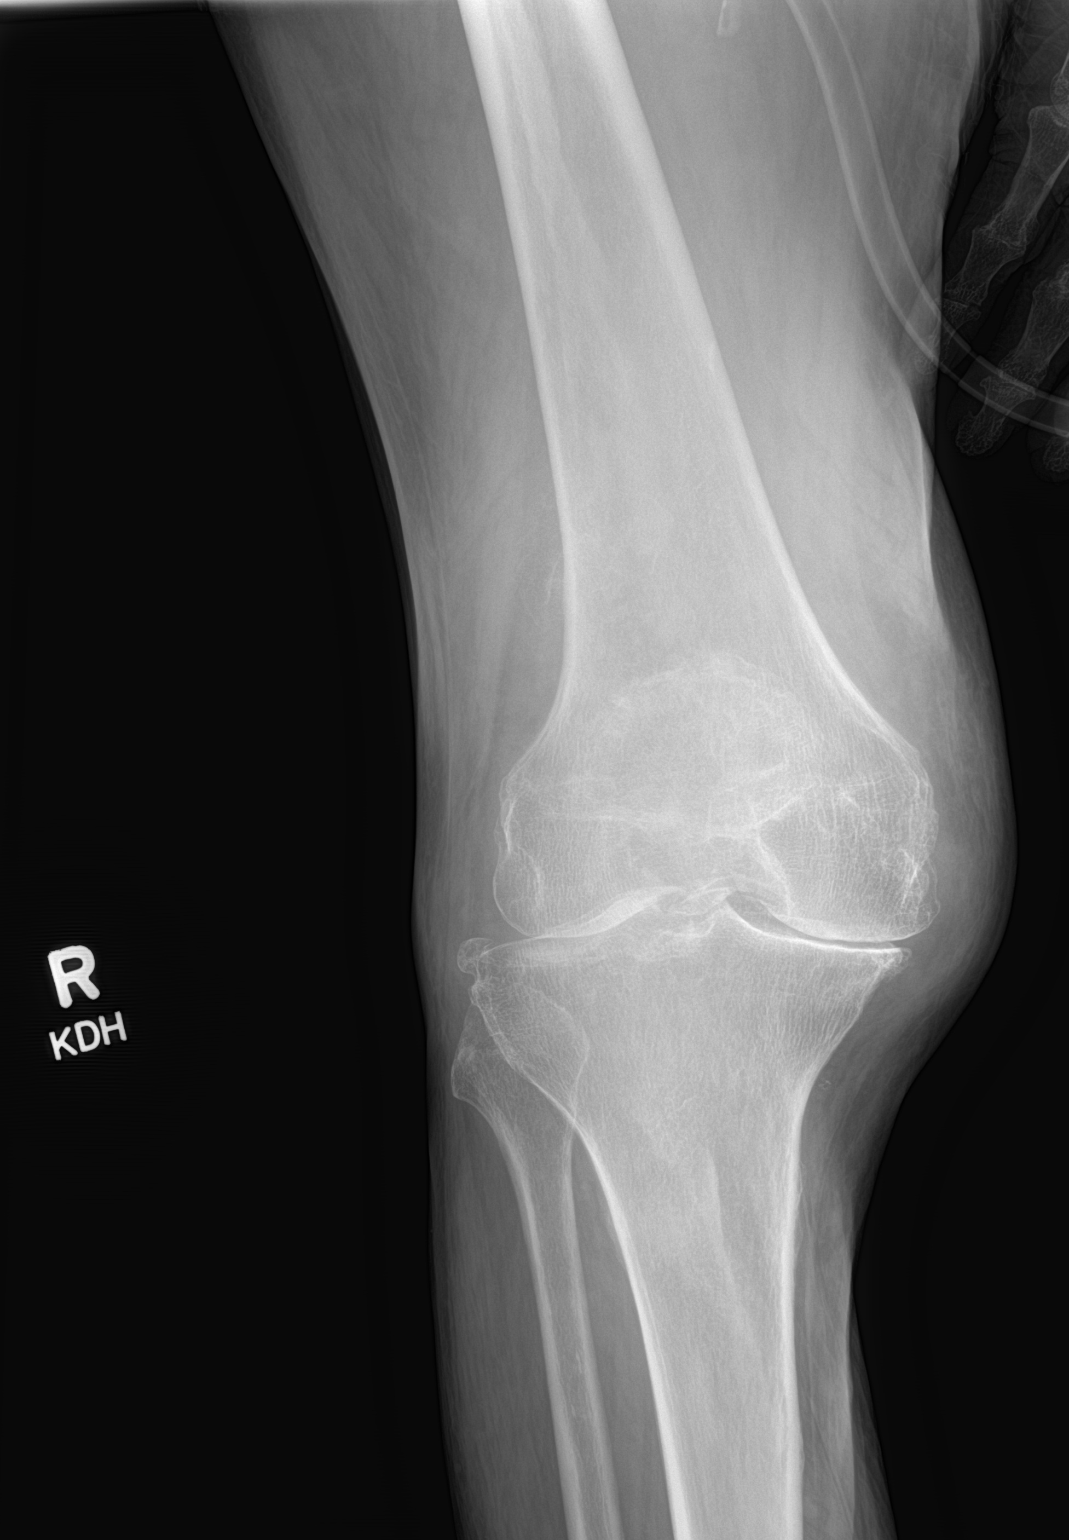

[knee lat]
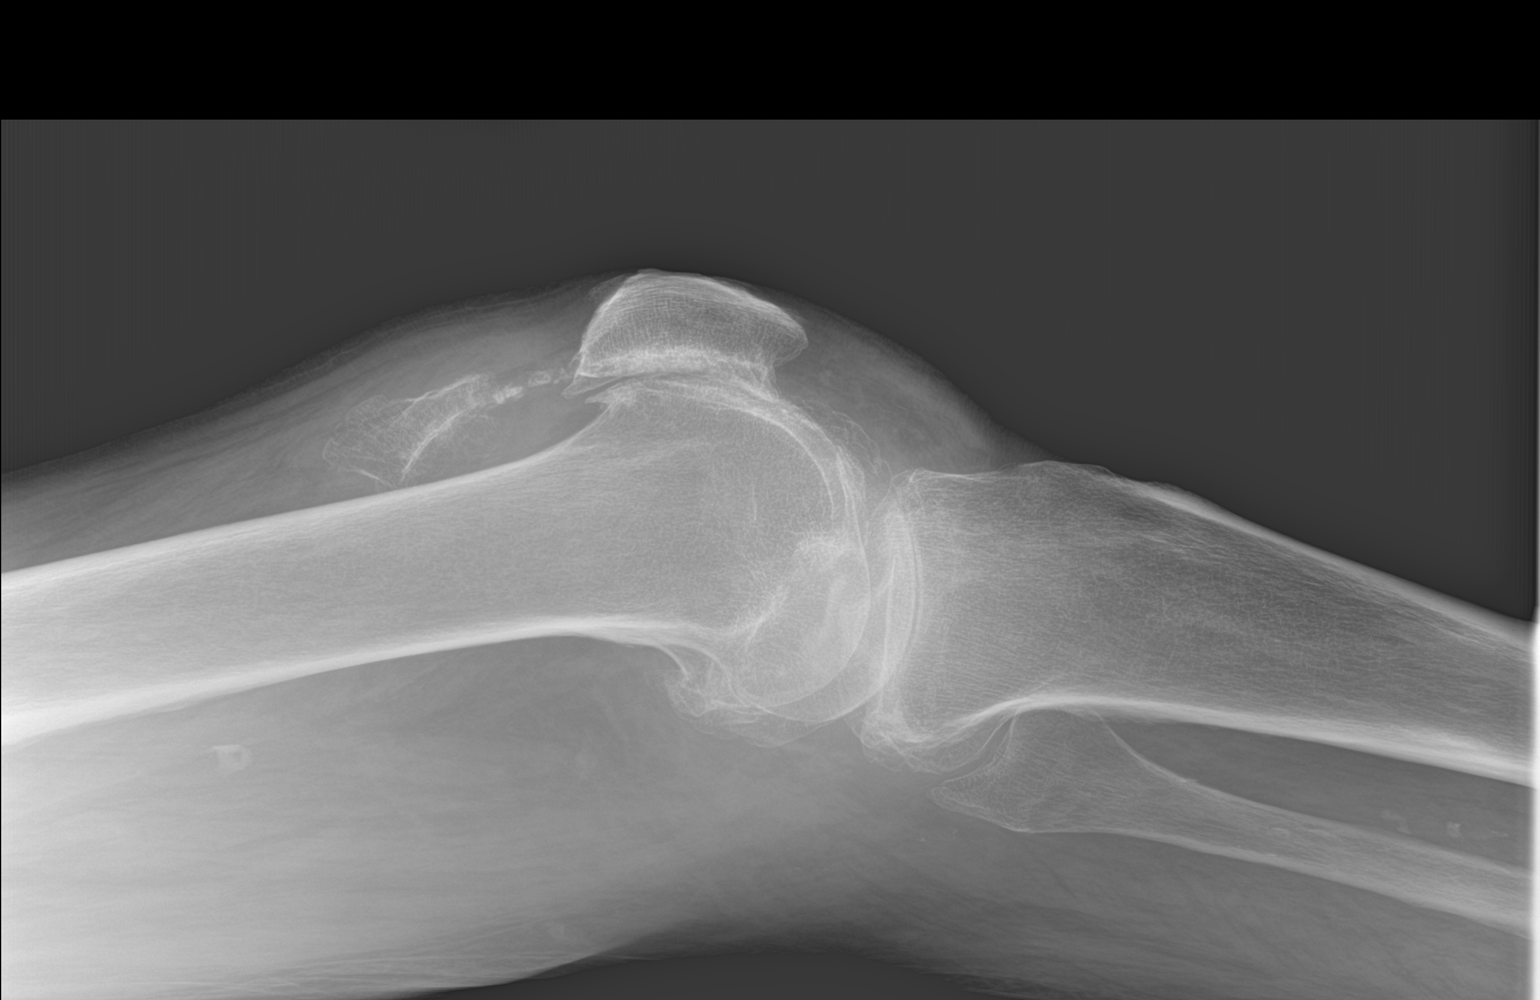

[2 of 2 positions shown; findings below may reference images not displayed]

FINDINGS: No fracture or dislocation. Severe tricompartmental degenerative
change of the knee, worse within the medial compartment with near
complete joint space loss, bone-on-bone articulation, subchondral
sclerosis and osteophytosis. There is minimal lateral deviation of
the tibial plateaus and relation to the femoral condyles. There are
several loose bodies seen within the suprapatellar knee joint space
though discrete donor sites are not identified. No definite joint
effusion. No evidence of chondrocalcinosis. Suspected mild diffuse
soft tissue swelling about the knee. No radiopaque foreign body.
IMPRESSION: 1. Diffuse soft tissue swelling about the knee without associated
fracture or dislocation.
2. Severe tricompartmental degenerative change of the knee, worst in
the medial compartment.
3. Loose bodies are identified within the suprapatellar knee joint
space no discrete donor site is not identified, presumably the
sequela of chronic advanced degenerative change.

## 2021-11-27 MED ORDER — SODIUM CHLORIDE 0.9 % IV SOLN: INTRAVENOUS | Status: DC

## 2021-11-27 MED ORDER — COLCHICINE 0.3 MG HALF TABLET
0.3000 mg | ORAL_TABLET | Freq: Every day | ORAL | Status: DC
  Filled 2021-11-27 (×3): qty 1

## 2021-11-27 MED ORDER — COLCHICINE 0.3 MG HALF TABLET
0.3000 mg | ORAL_TABLET | Freq: Once | ORAL | Status: DC
Start: 2021-11-27 — End: 2021-11-28
  Filled 2021-11-27 (×4): qty 1

## 2021-11-27 MED ORDER — COLCHICINE 0.6 MG PO TABS
0.6000 mg | ORAL_TABLET | ORAL | Status: AC
  Administered 2021-11-27: 0.6 mg via ORAL
  Filled 2021-11-27: qty 1

## 2021-11-27 MED ORDER — LIDOCAINE HCL 1 % IJ SOLN
10.0000 mL | Freq: Once | INTRAMUSCULAR | Status: DC
  Filled 2021-11-27: qty 10

## 2021-11-27 MED ORDER — ADULT MULTIVITAMIN W/MINERALS CH
1.0000 | ORAL_TABLET | Freq: Every day | ORAL | Status: DC
  Administered 2021-11-27 – 2021-11-28 (×2): 1 via ORAL
  Filled 2021-11-27 (×2): qty 1

## 2021-11-27 MED ORDER — PROSOURCE PLUS PO LIQD
30.0000 mL | Freq: Three times a day (TID) | ORAL | Status: DC
  Administered 2021-11-27 – 2021-11-28 (×3): 30 mL via ORAL
  Filled 2021-11-27 (×5): qty 30

## 2021-11-27 NOTE — Consult Note (Addendum)
ORTHOPAEDIC CONSULTATION  REQUESTING PHYSICIAN: Loletha Grayer, MD  Chief Complaint: Bilateral knee pain and swelling  HPI: Orthopedics was consulted on George Bolton who is a 81 y.o. male with bilateral knee pain and swelling.  Patient's family is at the bedside during initial evaluation.  Family explains the patient had a fall while getting into the shower in September or October injuring his right knee.  Since that time he has had dramatic decrease in his ability to ambulate.  Prior to that the patient was reported to have been driving.  Orthopedics is consulted today for bilateral knee (R >L) pain and swelling.  Rheumatology was not available for consultation.  Patient has a history of gout.  Patient has a history of atrial fibrillation but his Eliquis is on hold.  He is currently being treated with Rocephin and Zithromax for community-acquired lower lobe pneumonia.  Patient has MRI evidence of an acute infarct of the left frontal lobe.  Neurology is following.  Past Medical History:  Diagnosis Date   Chronic atrial fibrillation ([place])    a. Afib dates back to at least 2002 when reviewing prior EKG with EKGs from 2014 onward showing Afib; b. CHADS2VASc at least 4 (CHF, HTN, age x 2)   Chronic diastolic CHF (congestive heart failure) ([place])    a. TTE 5/19: EF 70-75%, mod LVH, near complete obliteration of the mid and apical LV cavity (can be seen in apical hypertrophic CM), mildly dilated LA, RV cavity size nl, RV wall thickness nl, RVSF mildly reduced, mildly dilated RA; b. 02/2018 Limited echo w/ definity: EF 50-65%, no apical hypertrophic CM.   Gout    Hypertension    Past Surgical History:  Procedure Laterality Date   CHOLECYSTECTOMY N/A 01/13/2017   Procedure: LAPAROSCOPIC CHOLECYSTECTOMY;  Surgeon: Jules Husbands, MD;  Location: [facility] ORS;  Service: General;  Laterality: N/A;   Social History   Socioeconomic History   Marital status: Married    Spouse name: Not on file   Number  of children: Not on file   Years of education: Not on file   Highest education level: Not on file  Occupational History   Not on file  Tobacco Use   Smoking status: Never   Smokeless tobacco: Never  Vaping Use   Vaping Use: Never used  Substance and Sexual Activity   Alcohol use: No   Drug use: No   Sexual activity: Not on file  Other Topics Concern   Not on file  Social History Narrative   Not on file   Social Determinants of Health   Financial Resource Strain: Not on file  Food Insecurity: Not on file  Transportation Needs: Not on file  Physical Activity: Not on file  Stress: Not on file  Social Connections: Not on file   Family History  Problem Relation Age of Onset   Stroke Mother    Allergies  Allergen Reactions   Cortisone Anaphylaxis   Triamcinolone     Other reaction(s): Unknown   Prior to Admission medications   Medication Sig Start Date End Date Taking? Authorizing Provider  apixaban (ELIQUIS) 2.5 MG TABS tablet Take 2.5 mg by mouth 2 (two) times daily. 12/27/19  Yes [provider]  aspirin 325 MG tablet Take 650 mg by mouth daily.   Yes [provider]  atorvastatin (LIPITOR) 40 MG tablet Take 1 tablet by mouth daily.   Yes [provider]  cloNIDine (CATAPRES) 0.1 MG tablet Take 0.1 mg by mouth  2 (two) times daily. 11/07/21  Yes [provider]  famotidine (PEPCID) 20 MG tablet Take 1 tablet (20 mg total) by mouth 2 (two) times daily. 02/15/21  Yes Carrie Mew, MD  felodipine (PLENDIL) 10 MG 24 hr tablet Take 10 mg by mouth daily. 09/30/21  Yes [provider]  furosemide (LASIX) 40 MG tablet Take 1 tablet (40 mg total) by mouth daily as needed for fluid. 05/24/21  Yes Dahal, Marlowe Aschoff, MD  hydrALAZINE (APRESOLINE) 25 MG tablet Take 25 mg by mouth 3 (three) times daily. 11/21/21  Yes [provider]  isosorbide mononitrate (IMDUR) 30 MG 24 hr tablet Take 30 mg by mouth daily. 01/31/21  Yes [provider]  metoprolol tartrate (LOPRESSOR) 100 MG tablet Take 1 tablet (100 mg total) by mouth 2 (two) times daily. Patient taking differently: Take 50 mg by mouth 2 (two) times daily. 05/24/21 11/22/21 Yes Dahal, Marlowe Aschoff, MD  allopurinol (ZYLOPRIM) 100 MG tablet Take 50 mg by mouth daily. Patient not taking: Reported on 06/29/2021 06/14/21   [provider]  colchicine 0.6 MG tablet Take 0.6 mg by mouth daily as needed (gout flares). Patient not taking: Reported on 11/22/2021 06/14/21   [provider]   DG Knee 1-2 Views Right  Result Date: 11/27/2021 CLINICAL DATA:  Right knee pain. EXAM: RIGHT KNEE - 1-2 VIEW COMPARISON:  None. FINDINGS: No fracture or dislocation. Severe tricompartmental degenerative change of the knee, worse within the medial compartment with near complete joint space loss, bone-on-bone articulation, subchondral sclerosis and osteophytosis. There is minimal lateral deviation of the tibial plateaus and relation to the femoral condyles. There are several loose bodies seen within the suprapatellar knee joint space though discrete donor sites are not identified. No definite joint effusion. No evidence of chondrocalcinosis. Suspected mild diffuse soft tissue swelling about the knee. No radiopaque foreign body. IMPRESSION: 1. Diffuse soft tissue swelling about the knee without associated fracture or dislocation. 2. 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 3. Loose bodies are identified within the suprapatellar knee joint space no discrete donor site is not identified, presumably the sequela of chronic advanced degenerative change. Electronically Signed   By: Sandi Mariscal M.D.   On: 11/27/2021 09:33   CT HEAD WO CONTRAST (5MM)  Result Date: 11/26/2021 CLINICAL DATA:  Worsening dysarthria and lethargy, acute stroke on MRI yesterday EXAM: CT HEAD WITHOUT CONTRAST TECHNIQUE: Contiguous axial images were obtained from the base of  the skull through the vertex without intravenous contrast. RADIATION DOSE REDUCTION: This exam was performed according to the departmental dose-optimization program which includes automated exposure control, adjustment of the mA and/or kV according to patient size and/or use of iterative reconstruction technique. COMPARISON:  11/25/2021 FINDINGS: Brain: Confluent hypodensities throughout the periventricular and subcortical white matter are again identified, unchanged since prior exam and consistent with chronic small vessel ischemic changes. The acute infarct within the left frontal subcortical white matter on prior MRI is not readily apparent on CT. Chronic ischemic changes are also seen within the bilateral cerebellar hemispheres and left occipital lobe. No new infarct or hemorrhage. Lateral ventricles and midline structures are stable. No acute extra-axial fluid collections. No mass effect. Vascular: No hyperdense vessel or unexpected calcification. Skull: Normal. Negative for fracture or focal lesion. Sinuses/Orbits: No acute finding. Other: None. IMPRESSION: 1. Chronic small-vessel ischemic changes throughout the bilateral periventricular and subcortical white matter. The acute left frontal subcortical white matter infarcts seen on recent MRI are not  readily apparent by CT. 2. 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 3. No new infarct or acute hemorrhage. Electronically Signed   By: Randa Ngo M.D.   On: 11/26/2021 16:48   MR BRAIN WO CONTRAST  Result Date: 11/25/2021 CLINICAL DATA:  Left-sided weakness, stroke suspected EXAM: MRI HEAD WITHOUT CONTRAST TECHNIQUE: Multiplanar, multiecho pulse sequences of the brain and surrounding structures were obtained without intravenous contrast. COMPARISON:  No prior MRI correlation is made with CT head 11/25/2021 FINDINGS: Evaluation is somewhat limited by motion and patient inability to cooperate, with a  truncated exam obtained. Only axial and coronal diffusion-weighted sequences, axial FLAIR, and susceptibility weighted images were obtained. Within this limitation, restricted diffusion with ADC correlate in the left frontal lobe (series 9, images 32-35). No other restricted diffusion. No acute hemorrhage, mass, mass effect, or midline shift. Ventriculomegaly, which appears in proportion to sulcal size, likely reflecting greater than expected cerebral volume loss for age. Confluent T2 hyperintense signal in the periventricular white matter, likely the sequela of severe chronic small vessel ischemic disease. 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 No extra-axial collection. No significant sinus mucosal thickening or fluid in the mastoid air cells. Evaluation the orbits is limited by motion. No diffusion restricting calvarial lesion. IMPRESSION: Evaluation is somewhat limited by motion and patient inability to cooperate with the exam, with only certain sequences obtained. 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 No significant mass effect or midline shift. These results will be called to the ordering clinician or representative by the Radiologist Assistant, and communication documented in the PACS or [employer]. Electronically Signed   By: Merilyn Baba M.D.   On: 11/25/2021 22:39    Positive ROS: All other systems have been reviewed and were otherwise negative with the exception of those mentioned in the HPI and as above.  Physical Exam: General: Arousable but drowsy.  No acute distress  MUSCULOSKELETAL: Right knee: Patient has a large right knee effusion.  He has pain with active flexion and extension.    Assessment: Bilateral knee pain and effusions.    Plan: I discussed with the family that the patient has knee effusions bilaterally.  This may be gout  given his history.  I have been asked by the hospitalist service to aspirate his right knee, but his left knee also had a moderate effusion and I decided to aspirate both knees.  Patient's family member feels that he has an injury to the right knee which has contributed to his functional decline.  Patient's x-rays show advanced tricompartmental osteoarthritis.  I will order a right knee MRI to evaluate for concomitant soft tissue injury.  Procedure note:  Patient was prepped over the superolateral right knee with alcohol swabs and Hibiclens.  He was injected with 5 cc of 1% lidocaine plain.  Patient then had an 18-gauge spinal needle with a 60 cc syringe advanced into the right knee.  Approximately 30 cc of cloudy serous fluid was aspirated.  The aspiration site was cleaned with alcohol and a sterile bandage was applied.  Patient then had his left knee prepped over the superolateral knee with alcohol swabs and Hibiclens.  He was injected with 5 cc of 1% lidocaine plain.  Patient then had an 18-gauge spinal needle with a 60 cc syringe advanced into the left knee.  Approximately 40 cc of cloudy serous fluid was  aspirated.  The aspiration site was cleaned with alcohol and a sterile bandage was applied.  The fluid from both knees was ordered for cell count, Gram stain, culture and crystals.    Thornton Park, MD    11/27/2021 3:24 PM

## 2021-11-27 NOTE — Progress Notes (Signed)
Progress Note   Patient: George Bolton QPY:[id number] DOB: 08-Nov-1940 DOA: 11/22/2021     5 DOS: the patient was seen and examined on 11/27/2021    Assessment and Plan: * Acute embolic stroke [facility]) MRI of the brain shows an acute infarct on the left frontal lobe without evidence of hemorrhage.  Eliquis on hold as per neurology for 3 days.  Started aspirin.  LDL 34.  Already on Lipitor.  Gout- (present on admission) Suspected acute gout right knee.  Patient already on allopurinol.  I added colchicine.  Need to be careful with this medication with creatinine elevation.  Patient refused steroids.  X-ray reviewed shows a lot of degenerative changes.  I contacted orthopedics who recommended rheumatology.  In the process of contacting rheumatology to see if they are available.  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  Baseline creatinine around 1.3.  Today's creatinine 3.14 today.  Holding on IV fluids with fluid overload.  Continue to monitor.  Chronic diastolic congestive heart failure ([place])- (present on admission) Fluid overload with IV fluids.  Echocardiogram showed a normal EF.  Atrial fibrillation ([place])- (present on admission) Holding Eliquis as per neurology for 3 days.  Metabolic acidemia Secondary to acute kidney injury on chronic kidney disease stage IIIa.       Subjective: The patient was sitting up and feeding himself breakfast with his right hand.  Patient's son at the bedside and was concerned about his right knee pain.  He stated that the patient had a fall and hit his knee on the bathtub and has been having right knee pain since.  Patient very limited motion of the right knee and painful to palpation.  Patient's son also concerned that he is not able to move  his left arm very well.  Patient was able to lift up his left arm and bend at his extremities and close his hand to make a fist.  Does have gouty changes in his fingers  Physical Exam: Vitals:   11/27/21 0012 11/27/21 0433 11/27/21 0734 11/27/21 1155  BP: 117/76 114/80 130/88 106/74  Pulse: 98 (!) 110 (!) 109 (!) 108  Resp: 20 17 20 20   Temp: 98.2 F (36.8 C) 98.7 F (37.1 C) 98.2 F (36.8 C) 98.9 F (37.2 C)  TempSrc:  Oral Oral   SpO2: 98% 100% 97% 93%  Weight:      Height:       Physical Exam HENT:     Head: Normocephalic.     Mouth/Throat:     Pharynx: No oropharyngeal exudate.  Eyes:     General: Lids are normal.     Conjunctiva/sclera: Conjunctivae normal.  Cardiovascular:     Rate and Rhythm: Normal rate and regular rhythm.     Heart sounds: Normal heart sounds, S1 normal and S2 normal.  Pulmonary:     Breath sounds: Normal breath sounds. No decreased breath sounds, wheezing, rhonchi or rales.  Abdominal:     Palpations: Abdomen is soft.     Tenderness: There is no abdominal tenderness.  Musculoskeletal:     Right knee: Swelling and deformity present. Decreased range of motion.  Skin:    General: Skin is warm.     Findings: No rash.  Neurological:     Mental Status: He is alert.     Comments:  Answers a few simple questions     Data Reviewed: Previous uric acid level on the higher side at 8.4 reviewed by me.  Right knee x-ray also reviewed by me showing diffuse soft tissue swelling and severe tricompartmental degenerative changes.  Loose bodies identified within the suprapatellar knee joint space.  Family Communication: Spoke with son at the bedside  Disposition: Status is: Inpatient Remains inpatient appropriate because: Now being treated for acute gout right knee.  Transitional care team working on rehab beds  Planned Discharge Destination: Rehab   Author: Loletha Grayer, MD 11/27/2021 1:00 PM  For on call review [email or web address].

## 2021-11-27 NOTE — Progress Notes (Signed)
PT Cancellation Note  Patient Details Name: George Bolton MRN: [id number] DOB: August 07, 1941   Cancelled Treatment:    Reason Eval/Treat Not Completed: Other (comment).  Pt currently busy with nursing care and nurse reports pt is having a lot of R knee pain (from gout).  Will re-attempt PT treatment session at a later date/time.  Leitha Bleak, PT 11/27/21, 11:59 AM

## 2021-11-27 NOTE — Progress Notes (Addendum)
OT Cancellation Note  Patient Details Name: George Bolton MRN: [id number] DOB: Apr 27, 1941   Cancelled Treatment:    Reason Eval/Treat Not Completed: Other (comment). Upon attempt, pt with family and palliative care. Will re-attempt OT tx at later date/time as pt is available and appropriate.   Addendum, 2:47pm: pt out of the room for imaging. Will re-attempt next date.  Ardeth Perfect., MPH, MS, OTR/L ascom [phone] 11/27/21, 2:04 PM

## 2021-11-27 NOTE — Consult Note (Addendum)
Consultation Note Date: 11/27/2021   Patient Name: George Bolton  DOB: 11-Jun-1941  MRN: [id number]  Age / Sex: 81 y.o., male  PCP: Denton Lank, MD Referring Physician: Loletha Grayer, MD  Reason for Consultation: Establishing goals of care  HPI/Patient Profile: 81 y.o. male  with past medical history of chronic atrial fibrillation on Eliquis, chronic diastolic CHF, hypertension, CKD stage 3a, gout admitted on 11/22/2021 with weakness, poor intake, and abnormal labs (leukocytosis, kidney failure). Admitted for acute renal failure and atrial fibrillation RVR. 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   Clinical Assessment and Goals of Care: I met today at Mr. Bontempo' bedside along with wife and son. He drifts to sleep off and on during our conversation. Family report that he has been the most alert today than he has previous days and was actually able to eat a some breakfast (~50%) but less lunch with only a few bites of items although he did drink fluids. Wife reports that he was drinking supplement well. Son explains that his father's weakness has been significant since a fall in Sept-Oct with ongoing R knee edema and tenderness. They have been unable to get him to seek medical attention previously. He does not have good trust of providers or health system. He also has a large fear of being placed in a facility. He has also had L hand weakness for years per son.   We discussed his overall decline and high risk for further decline. We discussed his need for more fluids making me question if he will be able to maintain adequate nutrition and hydration - especially enough to keep his kidneys functioning. He has significant weakness and decline that I anticipate will limit his  options (such as dialysis) if his kidneys were to decline further. I discussed further with wife and son after patient was taken for carotid ultrasound that now is time to have some difficult discussions to have an idea of Mr. Sweetin' quality of life and think of how aggressive we should be if he were to decline further. Family shares that they do not believe he would want a feeding tube. They know he does not want to go to a facility but they are unable to take care of him at home. Wife has her own health issues and unable to care for him. At this time they are hopeful that with treatment of gout and some time that he will have some improvements and be able to pursue rehab stay. We did discuss the barriers to overall improvement. I provided them with Hard Choices booklet. They were accepting of conversation and information and open to further conversation as well.   All questions/concerns addressed. Emotional support provided.   Primary Decision Maker Patient can contribute but with support of family (wife and 2 adult children).     SUMMARY OF RECOMMENDATIONS   - DNR confirmed - Good initial [place] conversation -  Overall functional decline and high risk for further decompensation - will need further conversation (outpatient palliative referral recommended) - Family hopeful for some level of improvement and hopeful to pursue rehab trial  Code Status/Advance Care Planning: DNR   Symptom Management:  Per attending, nephrology, neurology, orthopedics  Palliative Prophylaxis:  Aspiration, Bowel Regimen, Delirium Protocol, Frequent Pain Assessment, Oral Care, and Turn Reposition   Prognosis:  Overall prognosis poor with declining functional status and poor intake. Overall failure to thrive.   Discharge Planning: [facility] for rehab with Palliative care service follow-up      Primary Diagnoses: Present on Admission:  Acute kidney injury superimposed on chronic kidney disease  ([place])  Gout  Chronic diastolic congestive heart failure ([place])  Atrial fibrillation ([place])   I have reviewed the medical record, interviewed the patient and family, and examined the patient. The following aspects are pertinent.  Past Medical History:  Diagnosis Date   Chronic atrial fibrillation ([place])    a. Afib dates back to at least 2002 when reviewing prior EKG with EKGs from 2014 onward showing Afib; b. CHADS2VASc at least 4 (CHF, HTN, age x 2)   Chronic diastolic CHF (congestive heart failure) ([place])    a. TTE 5/19: EF 70-75%, mod LVH, near complete obliteration of the mid and apical LV cavity (can be seen in apical hypertrophic CM), mildly dilated LA, RV cavity size nl, RV wall thickness nl, RVSF mildly reduced, mildly dilated RA; b. 02/2018 Limited echo w/ definity: EF 50-65%, no apical hypertrophic CM.   Gout    Hypertension    Social History   Socioeconomic History   Marital status: Married    Spouse name: Not on file   Number of children: Not on file   Years of education: Not on file   Highest education level: Not on file  Occupational History   Not on file  Tobacco Use   Smoking status: Never   Smokeless tobacco: Never  Vaping Use   Vaping Use: Never used  Substance and Sexual Activity   Alcohol use: No   Drug use: No   Sexual activity: Not on file  Other Topics Concern   Not on file  Social History Narrative   Not on file   Social Determinants of Health   Financial Resource Strain: Not on file  Food Insecurity: Not on file  Transportation Needs: Not on file  Physical Activity: Not on file  Stress: Not on file  Social Connections: Not on file   Family History  Problem Relation Age of Onset   Stroke Mother    Scheduled Meds:  allopurinol  50 mg Oral Daily   aspirin EC  81 mg Oral Daily   atorvastatin  40 mg Oral QPM   azithromycin  500 mg Oral Daily   cloNIDine  0.1 mg Oral BID   [START ON 11/28/2021] colchicine  0.3 mg Oral Daily   famotidine  10 mg  Oral Daily   feeding supplement  1 Container Oral TID BM   insulin aspart  0-9 Units Subcutaneous TID AC & HS   isosorbide mononitrate  30 mg Oral Daily   metoprolol tartrate  50 mg Oral BID   Continuous Infusions:  sodium chloride 50 mL/hr at 11/27/21 0934   cefTRIAXone (ROCEPHIN)  IV 1 g (11/27/21 1053)   PRN Meds:.acetaminophen **OR** acetaminophen, magnesium hydroxide, ondansetron **OR** ondansetron (ZOFRAN) IV, traZODone Allergies  Allergen Reactions   Cortisone Anaphylaxis   Triamcinolone     Other  reaction(s): Unknown   Review of Systems  Constitutional:  Positive for activity change, appetite change and fatigue.  Musculoskeletal:        R knee edema with pain with activity or palpation L knee edema but less tender to palpation  Neurological:  Positive for weakness.   Physical Exam Vitals and nursing note reviewed.  Constitutional:      Appearance: He is ill-appearing.     Comments: Fluctuating alertness  Cardiovascular:     Rate and Rhythm: Tachycardia present.  Pulmonary:     Effort: No tachypnea, accessory muscle usage or respiratory distress.  Abdominal:     General: Abdomen is flat.     Palpations: Abdomen is soft.  Musculoskeletal:     Comments: Bilateral knees with edema and tenderness R > L Pain with even slight movement of legs  Neurological:     Comments: Responses are sometimes very appropriate and insightful and other times unable to understand him and confused response    Vital Signs: BP 130/88    Pulse (!) 109    Temp 98.2 F (36.8 C) (Oral)    Resp 20    Ht 6' (1.829 m)    Wt 79.4 kg    SpO2 97%    BMI 23.73 kg/m  Pain Scale: Faces   Pain Score: Asleep   SpO2: SpO2: 97 % O2 Device:SpO2: 97 % O2 Flow Rate: .   IO: Intake/output summary:  Intake/Output Summary (Last 24 hours) at 11/27/2021 1105 Last data filed at 11/27/2021 1034 Gross per 24 hour  Intake 420 ml  Output 350 ml  Net 70 ml    LBM: Last BM Date: 11/23/21 Baseline Weight:  Weight: 79.4 kg Most recent weight: Weight: 79.4 kg     Palliative Assessment/Data:     Time Total: 85 min  Greater than 50%  of this time was spent counseling and coordinating care related to the above assessment and plan.  Signed by: Vinie Sill, NP Palliative Medicine Team Pager # [phone] (M-F 8a-5p) Team Phone # [phone] (Nights/Weekends)

## 2021-11-27 NOTE — Progress Notes (Addendum)
[facility]  ROUNDING NOTE   Subjective:   Patient currently sitting up in bed requesting water for thirst, alert No family at bedside Denies shortness of breath remains on room air No edema  Creatinine continues to slowly increase, 3.1 Urine output 1 L recorded in past 24 hours  Objective:  Vital signs in last 24 hours:  Temp:  [97.9 F (36.6 C)-98.7 F (37.1 C)] 98.2 F (36.8 C) (02/02 0734) Pulse Rate:  [97-110] 109 (02/02 0734) Resp:  [17-20] 20 (02/02 0734) BP: (114-140)/(76-93) 130/88 (02/02 0734) SpO2:  [96 %-100 %] 97 % (02/02 0734)  Weight change:  Filed Weights   11/21/21 1800  Weight: 79.4 kg    Intake/Output: I/O last 3 completed shifts: In: 720 [P.O.:720] Out: 2725 [Urine:2725]   Intake/Output this shift:  No intake/output data recorded.  Physical Exam: General: NAD  Head: Normocephalic, atraumatic. Moist oral mucosal membranes  Eyes: Anicteric  Lungs:  Clear to auscultation, normal effort, room air  Heart: Regular rate and rhythm  Abdomen:  Soft, nontender, nondistended  Extremities: No peripheral edema.  Neurologic: Alert, moving all four extremities  Skin: No lesions       Basic Metabolic Panel: Recent Labs  Lab 11/23/21 0613 11/24/21 0625 11/25/21 0517 11/26/21 0428 11/27/21 0444  NA 136 135 137 138 138  K 4.8 4.7 4.1 4.5 4.4  CL 109 107 107 107 111  CO2 21* 18* 21* 20* 20*  GLUCOSE 95 105* 103* 124* 113*  BUN 55* 47* 42* 43* 40*  CREATININE 3.29* 2.97* 3.02* 3.05* 3.14*  CALCIUM 8.7* 8.9 8.7* 8.9 8.7*  MG  --   --  2.2 2.2  --   PHOS  --   --   --  3.1  --      Liver Function Tests: No results for input(s): AST, ALT, ALKPHOS, BILITOT, PROT, ALBUMIN in the last 168 hours. No results for input(s): LIPASE, AMYLASE in the last 168 hours. No results for input(s): AMMONIA in the last 168 hours.  CBC: Recent Labs  Lab 11/21/21 1803 11/22/21 0515 11/23/21 0613 11/26/21 0428  WBC 17.3* 12.1* 8.8 11.8*   NEUTROABS  --   --  6.7 9.6*  HGB 14.8 14.1 11.9* 13.0  HCT 46.0 43.2 36.9* 39.7  MCV 85.3 83.1 84.2 83.2  PLT 198 170 167 240     Cardiac Enzymes: No results for input(s): CKTOTAL, CKMB, CKMBINDEX, TROPONINI in the last 168 hours.  BNP: Invalid input(s): POCBNP  CBG: Recent Labs  Lab 11/26/21 1210 11/26/21 1716 11/26/21 1938 11/27/21 0431 11/27/21 0736  GLUCAP 134* 140* 225* 111* 102*     Microbiology: Results for orders placed or performed during the hospital encounter of 11/22/21  Resp Panel by RT-PCR (Flu A&B, Covid) Nasopharyngeal Swab     Status: None   Collection Time: 11/22/21  1:00 AM   Specimen: Nasopharyngeal Swab; Nasopharyngeal(NP) swabs in vial transport medium  Result Value Ref Range Status   SARS Coronavirus 2 by RT PCR NEGATIVE NEGATIVE Final    Comment: (NOTE) SARS-CoV-2 target nucleic acids are NOT DETECTED.  The SARS-CoV-2 RNA is generally detectable in upper respiratory specimens during the acute phase of infection. The lowest concentration of SARS-CoV-2 viral copies this assay can detect is 138 copies/mL. A negative result does not preclude SARS-Cov-2 infection and should not be used as the sole basis for treatment or other patient management decisions. A negative result may occur with  improper specimen collection/handling, submission of specimen other than  nasopharyngeal swab, presence of viral mutation(s) within the areas targeted by this assay, and inadequate number of viral copies(<138 copies/mL). A negative result must be combined with clinical observations, patient history, and epidemiological information. The expected result is Negative.  Fact Sheet for Patients:  EntrepreneurPulse.com.au  Fact Sheet for Healthcare Providers:  IncredibleEmployment.be  This test is no t yet approved or cleared by the [place] FDA and  has been authorized for detection and/or diagnosis of SARS-CoV-2 by FDA  under an Emergency Use Authorization (EUA). This EUA will remain  in effect (meaning this test can be used) for the duration of the COVID-19 declaration under Section 564(b)(1) of the Act, 21 U.S.C.section 360bbb-3(b)(1), unless the authorization is terminated  or revoked sooner.       Influenza A by PCR NEGATIVE NEGATIVE Final   Influenza B by PCR NEGATIVE NEGATIVE Final    Comment: (NOTE) The Xpert Xpress SARS-CoV-2/FLU/RSV plus assay is intended as an aid in the diagnosis of influenza from Nasopharyngeal swab specimens and should not be used as a sole basis for treatment. Nasal washings and aspirates are unacceptable for Xpert Xpress SARS-CoV-2/FLU/RSV testing.  Fact Sheet for Patients: EntrepreneurPulse.com.au  Fact Sheet for Healthcare Providers: IncredibleEmployment.be  This test is not yet approved or cleared by the [place] FDA and has been authorized for detection and/or diagnosis of SARS-CoV-2 by FDA under an Emergency Use Authorization (EUA). This EUA will remain in effect (meaning this test can be used) for the duration of the COVID-19 declaration under Section 564(b)(1) of the Act, 21 U.S.C. section 360bbb-3(b)(1), unless the authorization is terminated or revoked.  Performed at [facility], [address] [place], [place] [address]     Coagulation Studies: No results for input(s): LABPROT, INR in the last 72 hours.  Urinalysis: No results for input(s): COLORURINE, LABSPEC, PHURINE, GLUCOSEU, HGBUR, BILIRUBINUR, KETONESUR, PROTEINUR, UROBILINOGEN, NITRITE, LEUKOCYTESUR in the last 72 hours.  Invalid input(s): APPERANCEUR    Imaging: DG Knee 1-2 Views Right  Result Date: 11/27/2021 CLINICAL DATA:  Right knee pain. EXAM: RIGHT KNEE - 1-2 VIEW COMPARISON:  None. FINDINGS: No fracture or dislocation. 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 There is minimal lateral deviation of the tibial plateaus and relation to the femoral condyles. There are several loose bodies seen within the suprapatellar knee joint space though discrete donor sites are not identified. No definite joint effusion. No evidence of chondrocalcinosis. Suspected mild diffuse soft tissue swelling about the knee. No radiopaque foreign body. IMPRESSION: 1. Diffuse soft tissue swelling about the knee without associated fracture or dislocation. 2. 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 3. Loose bodies are identified within the suprapatellar knee joint space no discrete donor site is not identified, presumably the sequela of chronic advanced degenerative change. Electronically Signed   By: Sandi Mariscal M.D.   On: 11/27/2021 09:33   CT HEAD WO CONTRAST (5MM)  Result Date: 11/26/2021 CLINICAL DATA:  Worsening dysarthria and lethargy, acute stroke on MRI yesterday EXAM: CT HEAD WITHOUT CONTRAST TECHNIQUE: Contiguous axial images were obtained from the base of the skull through the vertex without intravenous contrast. RADIATION DOSE REDUCTION: This exam was performed according to the departmental dose-optimization program which includes automated exposure control, adjustment of the mA and/or kV according to patient size and/or use of iterative reconstruction technique. COMPARISON:  11/25/2021 FINDINGS: Brain: Confluent hypodensities  throughout the periventricular and subcortical white matter are again identified, unchanged since prior exam and consistent with chronic small vessel ischemic changes. The acute infarct within the left frontal subcortical white matter on prior MRI is not readily apparent on CT. Chronic ischemic changes are also seen within the bilateral cerebellar hemispheres and left occipital lobe. No new infarct or hemorrhage. Lateral  ventricles and midline structures are stable. No acute extra-axial fluid collections. No mass effect. Vascular: No hyperdense vessel or unexpected calcification. Skull: Normal. Negative for fracture or focal lesion. Sinuses/Orbits: No acute finding. Other: None. IMPRESSION: 1. Chronic small-vessel ischemic changes throughout the bilateral periventricular and subcortical white matter. The acute left frontal subcortical white matter infarcts seen on recent MRI are not readily apparent by CT. 2. 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 3. No new infarct or acute hemorrhage. Electronically Signed   By: Randa Ngo M.D.   On: 11/26/2021 16:48   CT HEAD WO CONTRAST (5MM)  Result Date: 11/25/2021 CLINICAL DATA:  Generalized weakness. EXAM: CT HEAD WITHOUT CONTRAST TECHNIQUE: Contiguous axial images were obtained from the base of the skull through the vertex without intravenous contrast. RADIATION DOSE REDUCTION: This exam was performed according to the departmental dose-optimization program which includes automated exposure control, adjustment of the mA and/or kV according to patient size and/or use of iterative reconstruction technique. COMPARISON:  April 19, 2020. 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 No mass effect or midline shift is noted. Ventricular size is within normal limits. There is no evidence of mass lesion, hemorrhage or acute infarction. Vascular: No hyperdense vessel or unexpected calcification. Skull: Normal. Negative for fracture or focal lesion. Sinuses/Orbits: No acute finding. Other: None. IMPRESSION: No acute intracranial abnormality seen. Electronically Signed   By: Marijo Conception M.D.   On: 11/25/2021 11:53   MR BRAIN WO CONTRAST  Result Date: 11/25/2021 CLINICAL DATA:  Left-sided weakness, stroke suspected EXAM: MRI HEAD WITHOUT CONTRAST TECHNIQUE:  Multiplanar, multiecho pulse sequences of the brain and surrounding structures were obtained without intravenous contrast. COMPARISON:  No prior MRI correlation is made with CT head 11/25/2021 FINDINGS: Evaluation is somewhat limited by motion and patient inability to cooperate, with a truncated exam obtained. Only axial and coronal diffusion-weighted sequences, axial FLAIR, and susceptibility weighted images were obtained. Within this limitation, restricted diffusion with ADC correlate in the left frontal lobe (series 9, images 32-35). No other restricted diffusion. No acute hemorrhage, mass, mass effect, or midline shift. Ventriculomegaly, which appears in proportion to sulcal size, likely reflecting greater than expected cerebral volume loss for age. Confluent T2 hyperintense signal in the periventricular white matter, likely the sequela of severe chronic small vessel ischemic disease. 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 No extra-axial collection. No significant sinus mucosal thickening or fluid in the mastoid air cells. Evaluation the orbits is limited by motion. No diffusion restricting calvarial lesion. IMPRESSION: Evaluation is somewhat limited by motion and patient inability to cooperate with the exam, with only certain sequences obtained. 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 No significant mass effect or midline shift. These results will be called to the ordering clinician or representative by the Radiologist Assistant, and communication documented in the PACS or [employer]. Electronically Signed   By: Merilyn Baba M.D.   On: 11/25/2021 22:39  Medications:    sodium chloride 50 mL/hr at 11/27/21 0934   cefTRIAXone (ROCEPHIN)  IV 1 g (11/26/21 1224)    allopurinol  50 mg Oral Daily   aspirin EC  81 mg Oral Daily   atorvastatin  40 mg Oral  QPM   azithromycin  500 mg Oral Daily   cloNIDine  0.1 mg Oral BID   [START ON 11/28/2021] colchicine  0.3 mg Oral Daily   famotidine  10 mg Oral Daily   feeding supplement  1 Container Oral TID BM   insulin aspart  0-9 Units Subcutaneous TID AC & HS   isosorbide mononitrate  30 mg Oral Daily   metoprolol tartrate  50 mg Oral BID   acetaminophen **OR** acetaminophen, magnesium hydroxide, ondansetron **OR** ondansetron (ZOFRAN) IV, traZODone  Assessment/ Plan:  Mr. MIKI BLANK is a 81 y.o.  male with previous medical history of chronic diastolic CHF, hypertension, atrial fibrillation, and gout.  Patient has been admitted for Dehydration [E86.0] Elevated troponin [R77.8] Generalized weakness [R53.1] Acute renal failure, unspecified acute renal failure type ([place]) [N17.9] Acute kidney injury superimposed on chronic kidney disease ([place]) [N17.9, N18.9] Leukocytosis, unspecified type [D72.829]   Acute Kidney Injury on chronic kidney disease stage IIIa with baseline creatinine 1.3 and GFR of 53 on 07/01/21.  Acute kidney injury secondary to dehydration and volume depletion Renal ultrasound shows no obstruction of bilateral renal cyst.  Furosemide currently held No acute indication of dialysis at this time.  Creatinine continues to slowly increase with adequate urine output recorded.  Due to concerns of continued dehydration from poor appetite, will begin gentle hydration with normal saline 50 ml/hr.   Lab Results  Component Value Date   CREATININE 3.14 (H) 11/27/2021   CREATININE 3.05 (H) 11/26/2021   CREATININE 3.02 (H) 11/25/2021    Intake/Output Summary (Last 24 hours) at 11/27/2021 0953 Last data filed at 11/27/2021 0500 Gross per 24 hour  Intake 180 ml  Output 675 ml  Net -495 ml    2.  Home regimen includes clonidine, furosemide, hydralazine, isosorbide, and metoprolol.  Currently receiving all medications except furosemide.  BP 129/90  3.  Chronic atrial fibrillation.  Remains  on home regimen of Eliquis.   4.  Chronic diastolic heart failure.  Echo on 11/22/2021 shows EF of 60 to 65% with moderate concentric left ventricular hypertrophy.  Left ventricular diastolic parameters indeterminate.  5.  Proteinuria seen on admission urinalysis, greater than 300.  Will order SPEP/UPEP and urine protein creatinine ratio for further evaluation  6.  Hypertension with chronic kidney disease.  Home regimen includes furosemide, hydralazine, isosorbide, and metoprolol. Currently receiving clonidine, isosorbide, and metoprolol.     LOS: [place] 2/2/20239:53 AM

## 2021-11-27 NOTE — Progress Notes (Signed)
Initial Nutrition Assessment  DOCUMENTATION CODES:   Not applicable  INTERVENTION:   -Continue Boost Breeze po TID, each supplement provides 250 kcal and 9 grams of protein  -MVI with minerals daily -30 ml Prosource Plus TID, each supplement provides 100 kcals and 15 grams protein -Mighty Shake TID with meals, each supplement provides 220 kclas and 6 grams protein  NUTRITION DIAGNOSIS:   Inadequate oral intake related to dysphagia as evidenced by per patient/family report.  GOAL:   Patient will meet greater than or equal to 90% of their needs  MONITOR:   PO intake, Supplement acceptance, Diet advancement, Labs, Weight trends, Skin, I & O's  REASON FOR ASSESSMENT:   Rounds    ASSESSMENT:   George Bolton is a 81 y.o. African-American male with medical history significant for chronic atrial fibrillation, chronic diastolic CHF, gout and hypertension, presented to the ER with acute onset of generalized weakness with diminished p.o. intake and abnormal labs that were checked by PCP with elevated creatinine.  He admits to chills without fever.  He denies any cough or wheezing or dyspnea.  No chest pain or palpitations.  No nausea or vomiting or diarrhea.  No dysuria, oliguria or hematuria or flank pain.  Pt acute embolic stroke.   2/1- s/p BSE- downgraded to dysphagia 2 diet with thin liquids  Reviewed I/O's: -895 ml x 24 hours and -1.3 L since admission  UOP: 1.1 L x 24 hours  Spoke with pt at bedside, who kept his eyes closed during the interview. He reports that he feels "okay" today. Pt shares that his swallow "comes and goes" and that he consumed some eggs for breakfast this morning. Noted meal completions 25-75%.   Pt's main concern was pain in his knee. When asked which knee he was experiencing pain with, he reports it "comes and goes from side to side". Pt denied any distress of discomfort during NFPE.    Reviewed wt hx; pt has experienced a 5.4% wt loss over the past  6 months, which is not significant for time frame.   Medications reviewed and include 0.9% sodium chloride infusion @ 50 ml/hr.   Lab Results  Component Value Date   HGBA1C 6.2 (H) 11/22/2021   PTA DM medications are none.   Labs reviewed: CBGS: 102-225 (inpatient orders for glycemic control are 0-9 units insulin aspart TID before meals and at bedtime).    NUTRITION - FOCUSED PHYSICAL EXAM:  Flowsheet Row Most Recent Value  Orbital Region No depletion  Upper Arm Region Mild depletion  Thoracic and Lumbar Region No depletion  Buccal Region No depletion  Temple Region Mild depletion  Clavicle Bone Region Moderate depletion  Clavicle and Acromion Bone Region Mild depletion  Scapular Bone Region Moderate depletion  Dorsal Hand Mild depletion  Patellar Region Mild depletion  Anterior Thigh Region Mild depletion  Posterior Calf Region Mild depletion  Edema (RD Assessment) None  Hair Reviewed  Eyes Reviewed  Mouth Reviewed  Skin Reviewed  Nails Reviewed       Diet Order:   Diet Order             DIET DYS 2 Room service appropriate? Yes with Assist; Fluid consistency: Thin  Diet effective now                   EDUCATION NEEDS:   No education needs have been identified at this time  Skin:  Skin Assessment: Reviewed RN Assessment  Last BM:  11/23/21  Height:   Ht Readings from Last 1 Encounters:  11/21/21 6' (1.829 m)    Weight:   Wt Readings from Last 1 Encounters:  11/21/21 79.4 kg    Ideal Body Weight:  80.9 kg  BMI:  Body mass index is 23.73 kg/m.  Estimated Nutritional Needs:   Kcal:  2200-2400  Protein:  120-135 grams  Fluid:  > 2 L    Loistine Chance, RD, LDN, [place] Registered Dietitian II Certified Diabetes Care and Education Specialist Please refer to [facility] for RD and/or RD on-call/weekend/after hours pager

## 2021-11-27 NOTE — Progress Notes (Signed)
S: Patient pleasant today, feeling well with no new neurologic complaints  MRI brain revealed acute infarct L frontal lobe. Eliquis on hold in the setting of acute infarct. Started on ASA 81mg  daily. TTE 1/28 showed no intracardiac clot.   LDL 34  Carotid US: no hemodynamically-significant stenosis  O:  Vitals:   11/27/21 1155 11/27/21 1643  BP: 106/74 111/85  Pulse: (!) 108 (!) 52  Resp: 20 15  Temp: 98.9 F (37.2 C) (!) 97.5 F (36.4 C)  SpO2: 93% 92%   Gen: lethargic but follows commands CV: RRR Resp: normal WOB  Mental status: lethargic but follows commands, oriented to self and hospital Speech: moderate dysarthria, no aphasia CN: PERRL, blinks to threat bilat, EOMI, sensation intact, R UMN facial droop, hearing intact to voice Motor: LUE and LLE 4/5 diffusely, anti-gravity RUE, some movement but not against gravity RLE Sensation: SILT Reflexes: 1+ symmetric throughout Coordination: intact L FNF  A/P: 81 yo man with pmhx significant for chronic a fib on eliquis, diastolic CHF, HTN who is admitted for AKI new onset R sided weakness 1/31 with MRI showing acute L frontal infarct. - Goal normotension now 48 hrs out from sx onset. Avoid hypotension. - TTE completed 1/28 - Carotid dopplers pending - Plan to hold eliquis x3 days in the setting of acute infarct with repeat head CT r/o interval hemorrhagic conversion before anticoagulation restart. ASA 81mg  daily while off eliquis. Aspirin does not need to be continued after eliquis is restarted from a neuro standpoint particularly since it may increase bleeding risk; however patient was on both ASA and eliquis prior to admission and the indication for aspirin should be clarified (it may be continued if there is a cardiac indication to do so) - Check A1c and LDL + add statin per guidelines - q4 hr neuro checks - STAT head CT for any change in neuro exam - Tele - PT/OT/SLP - Stroke education - Amb referral to neurology upon  discharge  Will continue to follow.  Su Monks, MD [facility] [phone]  If 7pm- 7am, please page neurology on call as listed in [place].

## 2021-11-28 ENCOUNTER — Inpatient Hospital Stay: Payer: Medicare Other

## 2021-11-28 DIAGNOSIS — M10061 Idiopathic gout, right knee: Secondary | ICD-10-CM

## 2021-11-28 LAB — BASIC METABOLIC PANEL
Anion gap: 5 (ref 5–15)
BUN: 46 mg/dL — ABNORMAL HIGH (ref 8–23)
CO2: 20 mmol/L — ABNORMAL LOW (ref 22–32)
Calcium: 8.3 mg/dL — ABNORMAL LOW (ref 8.9–10.3)
Chloride: 108 mmol/L (ref 98–111)
Creatinine, Ser: 2.8 mg/dL — ABNORMAL HIGH (ref 0.61–1.24)
GFR, Estimated: 22 mL/min — ABNORMAL LOW (ref >60)
Glucose, Bld: 162 mg/dL — ABNORMAL HIGH (ref 70–99)
Potassium: 4.2 mmol/L (ref 3.5–5.1)
Sodium: 133 mmol/L — ABNORMAL LOW (ref 135–145)

## 2021-11-28 LAB — RESP PANEL BY RT-PCR (FLU A&B, COVID) ARPGX2
Influenza A by PCR: NEGATIVE
Influenza B by PCR: NEGATIVE
SARS Coronavirus 2 by RT PCR: NEGATIVE

## 2021-11-28 LAB — GLUCOSE, CAPILLARY
Glucose-Capillary: 120 mg/dL — ABNORMAL HIGH (ref 70–99)
Glucose-Capillary: 183 mg/dL — ABNORMAL HIGH (ref 70–99)

## 2021-11-28 IMAGING — CT CT HEAD W/O CM
4 series · 16 of 47 positions shown, 18 images · non-contrast
Comparison: [DATE] CT brain, [DATE] MRI brain

CLINICAL DATA: Follow-up stroke for hemorrhagic conversion.



[Series 2: head wo · axial · 0.42mm/px · z∈[-177,-57]mm · 7 of 33 slices shown, 9 images]
[im 5/33  brain]
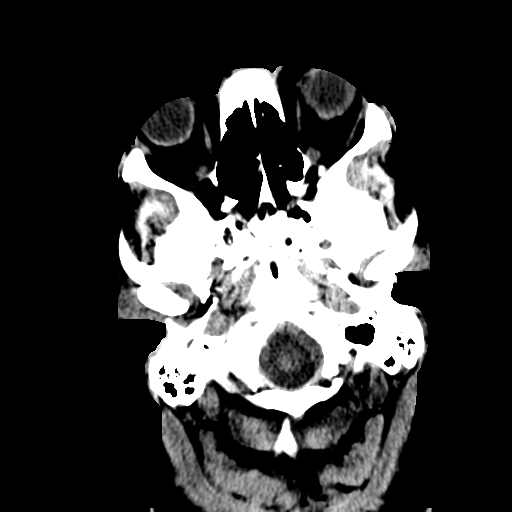
[im 5/33  bone]
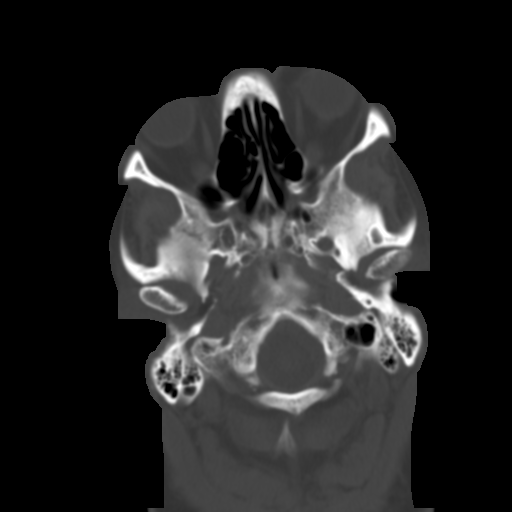
[im 9/33  brain]
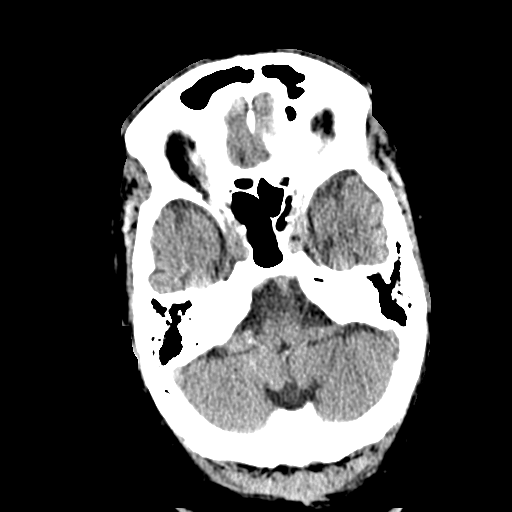
[im 13/33  brain]
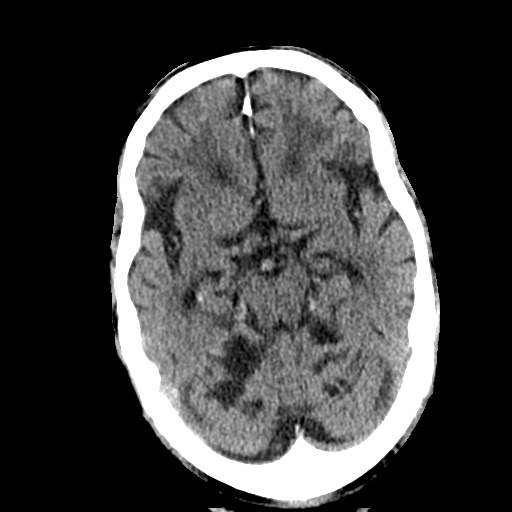
[im 17/33  brain]
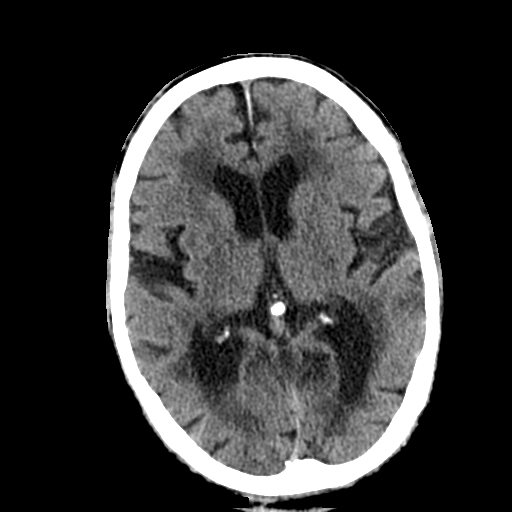
[im 21/33  brain]
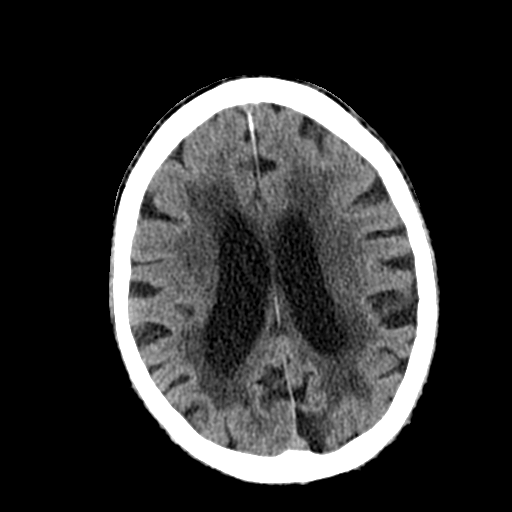
[im 21/33  bone]
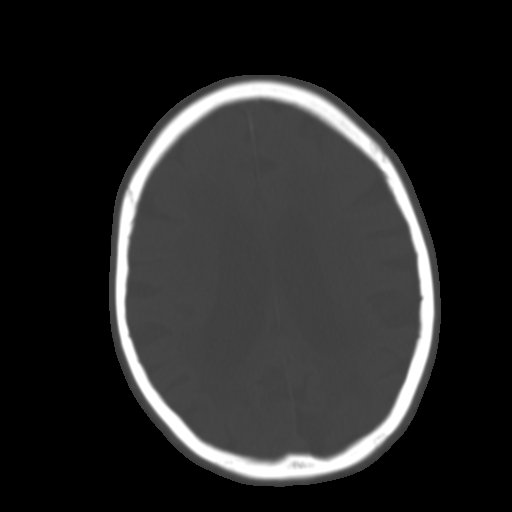
[im 25/33  brain]
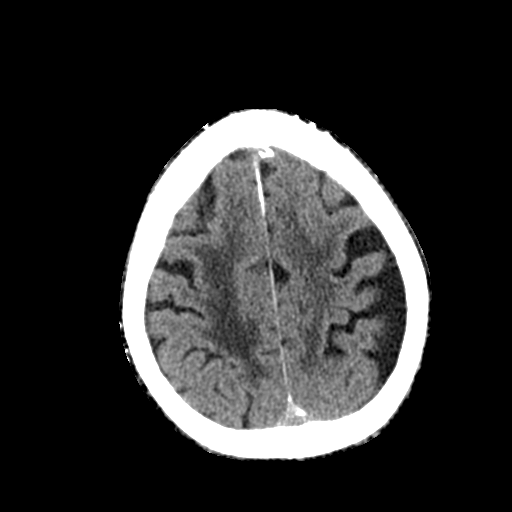
[im 29/33  brain]
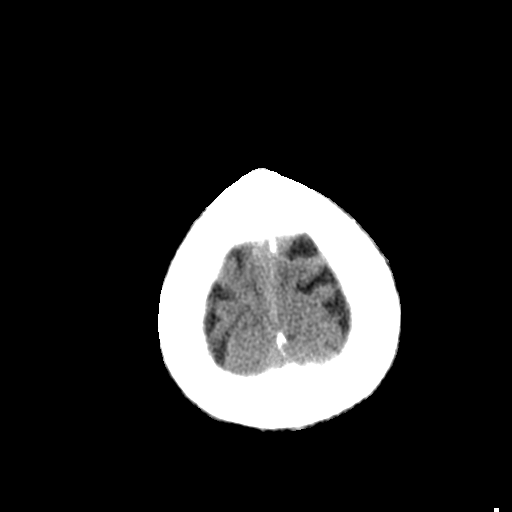

[Series 3: head bone · axial · 0.42mm/px · z∈[-181,-149]mm · 3 of 81 slices shown]
[im 9/81  bone]
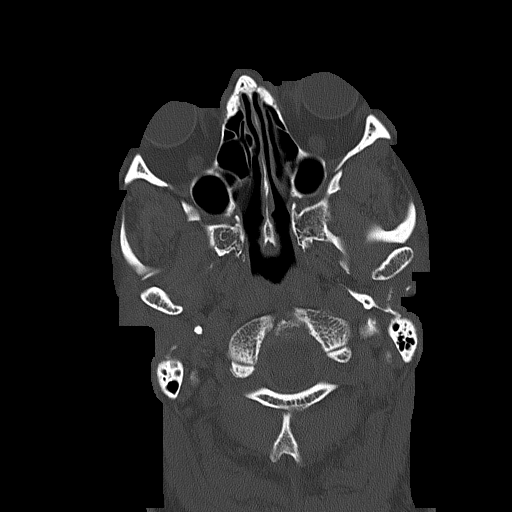
[im 17/81  bone]
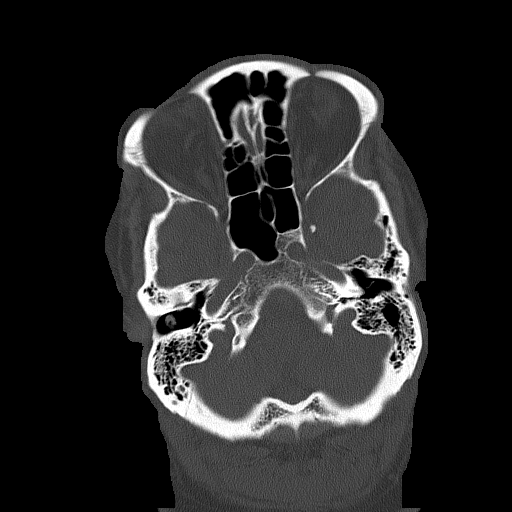
[im 25/81  bone]
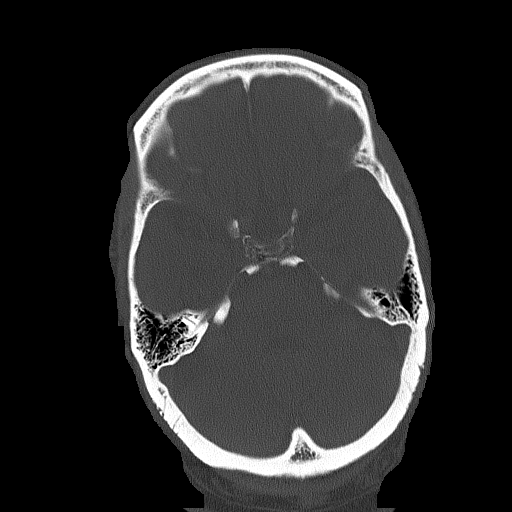

[Series 4: coronal soft tissue · coronal · 0.31mm/px · 3 of 70 slices shown]
[im 24/70  brain]
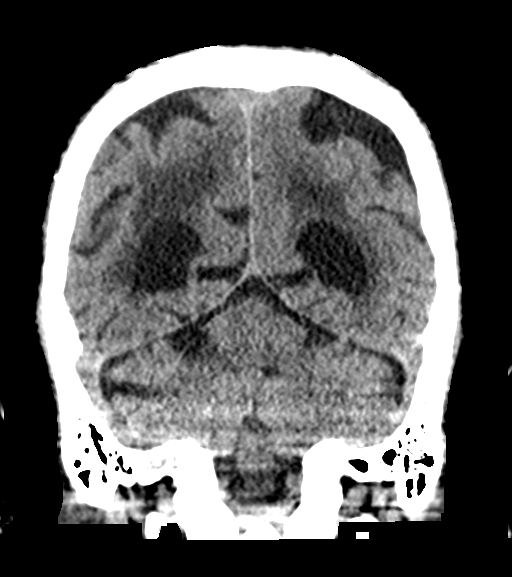
[im 31/70  brain]
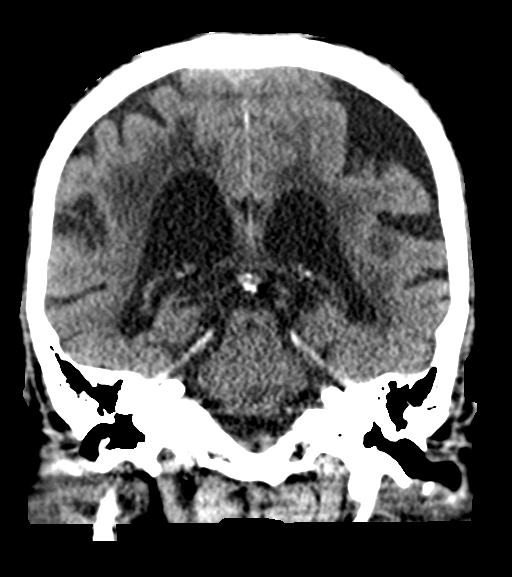
[im 39/70  brain]
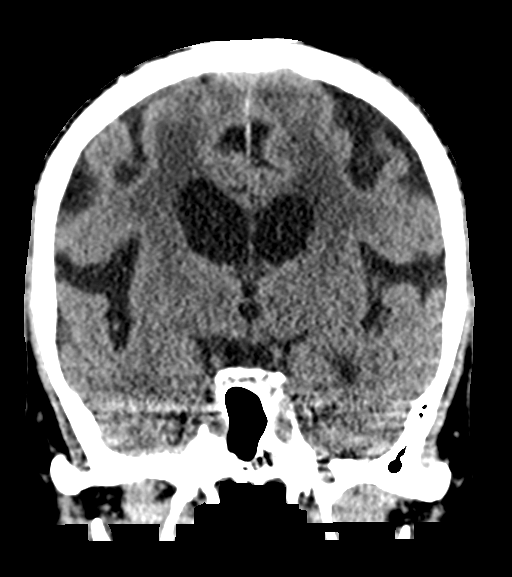

[Series 5: sagittal soft tissue · sagittal · 0.35mm/px · 3 of 52 slices shown]
[im 18/52  brain]
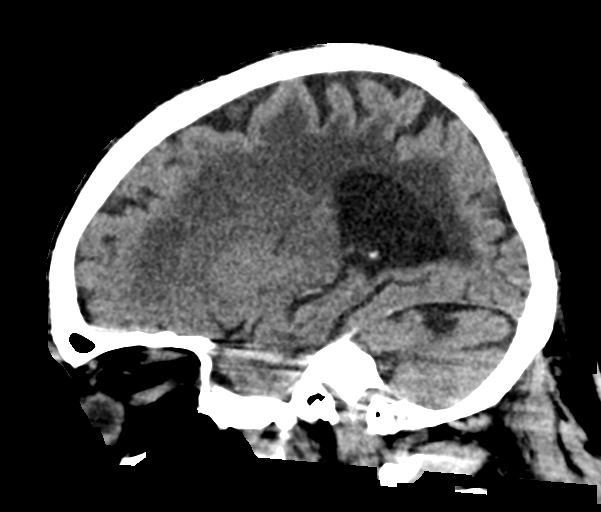
[im 26/52  brain]
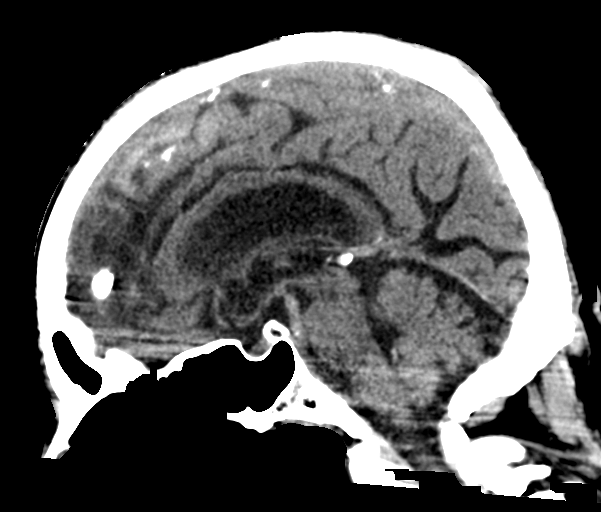
[im 35/52  brain]
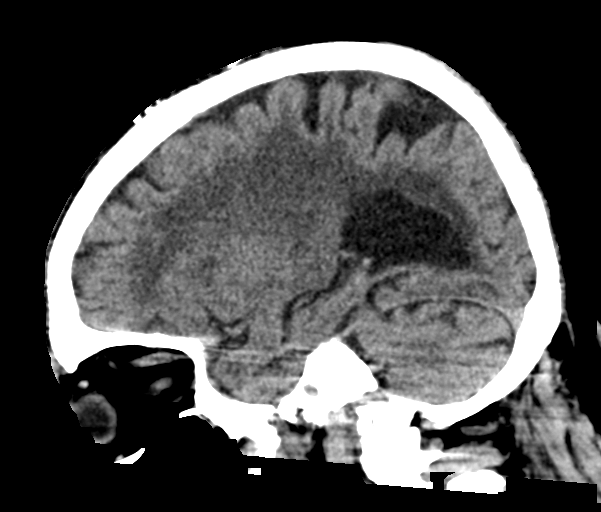

[16 of 47 positions shown; findings below may reference images not displayed]

FINDINGS: Brain: No evidence of acute infarction, hemorrhage, extra-axial
collection, ventriculomegaly, or mass effect. Left frontoparietal
lobe encephalomalacia. Acute-subacute left frontal lobe infarcts
seen on MRI are not well appreciated on the current exam.
Generalized cerebral atrophy. Periventricular white matter low
attenuation likely secondary to microangiopathy.

Vascular: Cerebrovascular atherosclerotic calcifications are noted.

Skull: Negative for fracture or focal lesion.

Sinuses/Orbits: Visualized portions of the orbits are unremarkable.
Visualized portions of the paranasal sinuses are unremarkable.
Visualized portions of the mastoid air cells are unremarkable.

Other: None.
IMPRESSION: 1. Left frontal lobe infarcts seen on MRI dated [DATE] are not
well appreciated on the current examination. No acute intracranial
hemorrhage. Are not well appreciated on the current exam.
2. No new acute intracranial pathology.

## 2021-11-28 MED ORDER — COLCHICINE 0.6 MG PO TABS: 0.3000 mg | ORAL_TABLET | Freq: Every day | ORAL | 0 refills | Status: DC

## 2021-11-28 MED ORDER — ALLOPURINOL 100 MG PO TABS: 50.0000 mg | ORAL_TABLET | Freq: Every day | ORAL | 0 refills | Status: DC

## 2021-11-28 MED ORDER — ADULT MULTIVITAMIN W/MINERALS CH: 1.0000 | ORAL_TABLET | Freq: Every day | ORAL | 0 refills | Status: DC

## 2021-11-28 MED ORDER — FAMOTIDINE 10 MG PO TABS: 10.0000 mg | ORAL_TABLET | Freq: Every day | ORAL | 0 refills | Status: DC

## 2021-11-28 MED ORDER — MORPHINE SULFATE (PF) 2 MG/ML IV SOLN
2.0000 mg | INTRAVENOUS | Status: DC | PRN
  Administered 2021-11-28 (×3): 2 mg via INTRAVENOUS
  Filled 2021-11-28 (×3): qty 1

## 2021-11-28 MED ORDER — METOPROLOL TARTRATE 50 MG PO TABS: 50.0000 mg | ORAL_TABLET | Freq: Two times a day (BID) | ORAL | 0 refills | Status: DC

## 2021-11-28 MED ORDER — ROSUVASTATIN CALCIUM 20 MG PO TABS: 20.0000 mg | ORAL_TABLET | Freq: Every day | ORAL | 0 refills | Status: AC

## 2021-11-28 MED ORDER — COLCHICINE 0.6 MG PO TABS
0.3000 mg | ORAL_TABLET | Freq: Every day | ORAL | Status: DC
  Administered 2021-11-28: 08:00:00 0.3 mg via ORAL
  Filled 2021-11-28: qty 0.5

## 2021-11-28 MED ORDER — POLYETHYLENE GLYCOL 3350 17 G PO PACK: 17.0000 g | PACK | Freq: Every day | ORAL | 0 refills | Status: DC | PRN

## 2021-11-28 MED ORDER — ACETAMINOPHEN 325 MG PO TABS: 650.0000 mg | ORAL_TABLET | Freq: Four times a day (QID) | ORAL | Status: DC | PRN

## 2021-11-28 MED ORDER — PROSOURCE PLUS PO LIQD: 30.0000 mL | Freq: Three times a day (TID) | ORAL | 0 refills | Status: DC

## 2021-11-28 NOTE — TOC Transition Note (Signed)
Transition of Care [facility]) - CM/SW Discharge Note   Patient Details  Name: George Bolton MRN: [id number] Date of Birth: Apr 14, 1941  [facility] CM/SW Contact:  Eileen Stanford, LCSW Phone Number: 11/28/2021, 3:58 PM   Clinical Narrative:   Clinical Social Worker facilitated patient discharge including contacting patient family and facility to confirm patient discharge plans.  Clinical information faxed to facility and family agreeable with plan.  CSW arranged ambulance transport via [facility] to .  RN to call for report prior to discharge.     Final next level of care: Skilled Nursing Facility Barriers to Discharge: No Barriers Identified   Patient Goals and CMS Choice Patient states their goals for this hospitalization and ongoing recovery are:: for pt to go to outpatient PT per pt's son   Choice offered to / list presented to : Adult Children  Discharge Placement              Patient chooses bed at:  ([facility]) Patient to be transferred to facility by: [facility]   Patient and family notified of of transfer: 11/28/21  Discharge Plan and Services In-house Referral: NA   Post Acute Care Choice: NA                    HH Arranged: NA          Social Determinants of Health (SDOH) Interventions     Readmission Risk Interventions No flowsheet data found.

## 2021-11-28 NOTE — Progress Notes (Addendum)
Speech Language Pathology Treatment: Dysphagia  Patient Details Name: George Bolton MRN: [id number] DOB: December 28, 1940 Today's Date: 11/28/2021 Time: 2836-6294 SLP Time Calculation (min) (ACUTE ONLY): 35 min  Assessment / Plan / Recommendation Clinical Impression  Pt seen today for ongoing assessment of toleration of oral diet. He appears to present w/ min oral phase dysphagia in light of suspected declined Cognitive status(min); unsure of Baseline status. This can impact overall awareness/timing of swallow and safety during po tasks which increases risk for aspiration, choking. Pt's risk for aspiration is present but can be reduced when following general aspiration precautions and using a modified diet consistency of Minced foods, moistened as well as intermittent Supervision w/ all oral intake to ensure for sitting Fully upright and oral clearing.   Pt consumed trials of purees, soft solids and thin liquids by Cup during Lunch meal. He sat Up in bed for po intake w/ some encouragement given and fed self independently. No overt clinical s/s of aspiration noted w/ trials; No immediate cough, no decline in vocal quality or in respiratory status during/post trials. Pt demonstrated min lengthy oral phase c/b mild decreased oral awareness and bolus management w/ increased mastication time; min Munching pattern w/ scattered residue(min) -- noted. Pt tended to put 3-4 fork boluses of foods in his mouth consecutively thus increasing the amount of bolus material in his mouth. Encouraged him to slow down and Clear mouth b/t each bite; alternate foods and liquids to aid oral clearing. This is much improved functioning from his presentation at the initial evaluation but still requires benefit from Monitoring at meals.  Pt benefits from intermittent Supervision at meals for setup and follow-through w/ precautions.     D/t pt's presentation/status, and risk for aspiration d/t oral phase dysphagia, pt would benefit from  a Dysphagia level 2(minced foods w/ gravies to moisten) w/ thin liquids - recommended; general aspiration precautions; reduce Distractions during meals and engage pt during po's at meal for self-feeding. Pills Crushed in Puree for safer swallowing. Support at meals as needed. MUST be sitting fully Upright for any oral intake. MD/NSG updated. ST services recommends follow w/ Palliative Care for [name] and education re: impact of any Cognitive decline on swallowing and overall oral intake.     HPI HPI: Pt is an 81 y.o. M admitted on 11/22/21 after presenting to the ED with acute onset of generalized weakness with diminished p.o. intake & abnormal labs. 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 Pt also has bilateral lower lobe CAP.  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  CXR: Cardiomegaly. Poor inspiration. There are no signs of pulmonary  edema or focal pulmonary consolidation; bibasilar atelectasis or edema. PMH: chronic a-fib, chronic diastolic CHF, gout, HTN. Unsure of pt's Baseline Cognitive status -- Son endorsed s/s of decline at home in functional State overall including mumbled/muttered speech (ongoing for years).      SLP Plan  All goals met      Recommendations for follow up therapy are one component of a multi-disciplinary discharge planning process, led by the attending physician.  Recommendations may be updated based on patient status, additional functional criteria and insurance authorization.    Recommendations  Diet recommendations: Dysphagia 2 (fine chop);Thin liquid Liquids provided via: Cup;No straw Medication Administration: Crushed with puree (for safer swallowing) Supervision: Patient able to self feed;Intermittent supervision to cue  for compensatory strategies Compensations: Minimize environmental distractions;Slow rate;Small  sips/bites;Lingual sweep for clearance of pocketing;Follow solids with liquid Postural Changes and/or Swallow Maneuvers: Out of bed for meals;Seated upright 90 degrees;Upright 30-60 min after meal                General recommendations:  (Dietician f/u; Palliative Care f/u for GOC/Support) Oral Care Recommendations: Oral care BID;Oral care before and after PO;Staff/trained caregiver to provide oral care Follow Up Recommendations: Follow physician's recommendations for discharge plan and follow up therapies Assistance recommended at discharge: Intermittent Supervision/Assistance SLP Visit Diagnosis: Dysphagia, oral phase (R13.11) (Cognitive decline apparent in his engagement) Plan: All goals met             George Bolton, [place], [place]; [place] [phone] (ascom) George Bolton  11/28/2021, 12:24 PM

## 2021-11-28 NOTE — Progress Notes (Signed)
Repeat CT scan of the head does not show any hemorrhage conversion.  Case discussed with neurology and okay to go back on Eliquis.  Okay for discharge.  Dr Loletha Grayer

## 2021-11-28 NOTE — Progress Notes (Signed)
PT Cancellation Note  Patient Details Name: George Bolton MRN: [id number] DOB: [birth year]   Cancelled Treatment:    Reason Eval/Treat Not Completed: Other (comment).  Pt resting in bed upon PT arrival; pt declining PT at this time d/t R knee pain (nurse notified pt requesting pain medication).  MRI of R knee showing: "1. 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 2. Proximal MCL strain without a complete tear.  3. Maceration of the anterior horn-body junction of the medial meniscus. Large complex tear of the posterior horn of the medial meniscus with a radial component at the root of the posterior horn.  4. Maceration of the anterior horn of the lateral meniscus.  Degeneration of the body and posterior horn of the lateral meniscus.  Oblique tear of the posterior horn of the lateral meniscus extending to the inferior articular surface.  5. Complete chronic ACL tear."  Per secure message with MD Mack Guise 2/3 (regarding R knee MRI results) pt does not require any bracing unless pt demonstrates significant R knee instability.  Will re-attempt PT session at a later date/time.  Leitha Bleak, PT 11/28/21, 3:10 PM

## 2021-11-28 NOTE — Discharge Summary (Addendum)
Physician Discharge Summary   Patient: George Bolton MRN: [id number] DOB: 10-07-1941  Admit date:     11/22/2021  Discharge date: 11/28/21  Discharge Physician: Loletha Grayer   PCP: Denton Lank, MD   Recommendations at discharge:   Follow-up team at rehab 1 day Follow-up nephrology 2 weeks Follow-up [facility] in 3 weeks Follow-up Dr. Mack Guise orthopedic surgery 3 weeks  Discharge Diagnoses: Principal Problem:   Acute embolic stroke [facility]) Active Problems:   Gout   Community acquired bilateral lower lobe pneumonia   Acute kidney injury superimposed on chronic kidney disease ([place])   Chronic diastolic congestive heart failure ([place])   Atrial fibrillation [facility])   Metabolic acidemia    Hospital Course: The patient was admitted to the hospital on 11/22/2021 with weakness.  The patient was found to have acute kidney injury on chronic kidney disease stage IIIa.  The patient was initially given IV fluids but then got fluid overloaded.  Initially had atrial fibrillation with rapid ventricular response.  The patient was treated completely for community-acquired bilateral lower lobe pneumonia with Rocephin and Zithromax a total of 5 days.  During the hospital course the patient did have an MRI of the brain that showed an acute infarct the left frontal lobe without evidence of hemorrhage.  We held Eliquis for 3 days and this can be restarted on the evening of 11/28/2021.  On 11/27/2021 the patient was complaining of right knee pain and limited motion.  The patient does have a history of gout so I suspected gout in the knee.  I did give the patient colchicine which seemed to improve things.  Dr. Mack Guise orthopedic surgery saw the patient and sent off fluid from the knee which did confirm intracellular monosodium urate crystals.  Dr. Mack Guise did order an MRI of the knee which did show bilateral meniscus tear, severe tricompartmental osteoarthritis and an MCL strain.  Continue working with  physical therapy for now.  With acute stroke is not a surgical candidate right at this point.     Assessment and Plan: * Acute embolic stroke ([place]) MRI of the brain shows an acute infarct on the left frontal lobe without evidence of hemorrhage.  Eliquis can be restarted this evening.  Discontinuing aspirin.  LDL 34.  Change Lipitor over to Crestor  Gout- (present on admission) Acute gout right knee.  Continue colchicine low-dose and allopurinol.  As needed Tylenol for pain.  The patient refused prednisone.  Community acquired bilateral lower lobe pneumonia Completed Rocephin and Zithromax during the hospital course  Acute kidney injury superimposed on chronic kidney disease ([place])- (present on admission) Acute kidney injury on chronic kidney disease stage IIIa.  Baseline creatinine around 1.3.  Today's creatinine 2.8 today.  Yesterday's creatinine was 3.14.  Will need follow-up with nephrology as outpatient.  Chronic diastolic congestive heart failure ([place])- (present on admission) Fluid overload with IV fluids.  Echocardiogram showed a normal EF.  Continue to hold Lasix as outpatient.  Continue metoprolol.  Atrial fibrillation ([place])- (present on admission) Restart Eliquis this evening and discontinue aspirin  Metabolic acidemia Secondary to acute kidney injury on chronic kidney disease stage IIIa.           Consultants: Orthopedic surgery, neurology, cardiology, nephrology Procedures performed: Right knee aspiration Disposition: Rehabilitation facility Diet recommendation:  Cardiac diet dysphagia 2 with thin liquids  DISCHARGE MEDICATION: Allergies as of 11/28/2021       Reactions   Cortisone Anaphylaxis   Triamcinolone    Other reaction(s): Unknown  Medication List     STOP taking these medications    aspirin 325 MG tablet   atorvastatin 40 MG tablet Commonly known as: LIPITOR   felodipine 10 MG 24 hr tablet Commonly known as: PLENDIL   furosemide  40 MG tablet Commonly known as: LASIX   hydrALAZINE 25 MG tablet Commonly known as: APRESOLINE       TAKE these medications    (feeding supplement) PROSource Plus liquid Take 30 mLs by mouth 3 (three) times daily between meals.   acetaminophen 325 MG tablet Commonly known as: TYLENOL Take 2 tablets (650 mg total) by mouth every 6 (six) hours as needed for mild pain (or Fever >/= 101).   allopurinol 100 MG tablet Commonly known as: ZYLOPRIM Take 0.5 tablets (50 mg total) by mouth daily.   apixaban 2.5 MG Tabs tablet Commonly known as: ELIQUIS Take 2.5 mg by mouth 2 (two) times daily.   cloNIDine 0.1 MG tablet Commonly known as: CATAPRES Take 0.1 mg by mouth 2 (two) times daily.   colchicine 0.6 MG tablet Take 0.5 tablets (0.3 mg total) by mouth daily. Start taking on: November 29, 2021 What changed:  how much to take when to take this reasons to take this   famotidine 10 MG tablet Commonly known as: PEPCID Take 1 tablet (10 mg total) by mouth daily. Start taking on: November 29, 2021 What changed:  medication strength how much to take when to take this   isosorbide mononitrate 30 MG 24 hr tablet Commonly known as: IMDUR Take 30 mg by mouth daily.   metoprolol tartrate 50 MG tablet Commonly known as: LOPRESSOR Take 1 tablet (50 mg total) by mouth 2 (two) times daily. What changed:  medication strength how much to take   multivitamin with minerals Tabs tablet Take 1 tablet by mouth daily. Start taking on: November 29, 2021   polyethylene glycol 17 g packet Commonly known as: MIRALAX / GLYCOLAX Take 17 g by mouth daily as needed for severe constipation.   rosuvastatin 20 MG tablet Commonly known as: Crestor Take 1 tablet (20 mg total) by mouth daily.        Contact information for follow-up providers     [facility] Follow up in 3 week(s).   Contact information: [address]     [address] [place]  [address] [phone]        Anthonette Legato, MD Follow up in 2 week(s).   Specialty: Nephrology Contact information: [place] [address] [phone]         Thornton Park, MD Follow up in 3 week(s).   Specialty: Orthopedic Surgery Contact information: [place] [address] [phone]              Contact information for after-discharge care     [facility] .   Service: Skilled Nursing Contact information: [place] [phone]                     Discharge Exam: Danley Danker Weights   11/21/21 1800  Weight: 79.4 kg   Physical Exam HENT:     Head: Normocephalic.     Mouth/Throat:     Pharynx: No oropharyngeal exudate.  Eyes:     General: Lids are normal.     Conjunctiva/sclera: Conjunctivae normal.  Cardiovascular:     Rate and Rhythm: Normal rate and regular rhythm.     Heart sounds: Normal heart sounds, S1 normal and S2 normal.  Pulmonary:     Breath sounds: Normal breath sounds. No decreased breath sounds, wheezing, rhonchi or rales.  Abdominal:     Palpations: Abdomen is soft.     Tenderness: There is no abdominal tenderness.  Musculoskeletal:     Right knee: Swelling and deformity present. Decreased range of motion.     Comments: Right knee better range of motion today than yesterday but still limited.  Skin:    General: Skin is warm.     Findings: No rash.  Neurological:     Mental Status: He is alert.     Comments: Answers a few simple questions     Condition at discharge: stable  The results of significant diagnostics from this hospitalization (including imaging, microbiology, ancillary and laboratory) are listed below for reference.   Imaging Studies: DG Chest 2 View  Result Date: 11/22/2021 CLINICAL DATA:  Weakness. EXAM: CHEST  - 2 VIEW COMPARISON:  November 21, 2021. FINDINGS: Stable cardiomediastinal silhouette. Mildly increased bibasilar atelectasis or edema is noted. Bony thorax is unremarkable. IMPRESSION: Mildly increased bibasilar atelectasis or edema. Electronically Signed   By: Marijo Conception M.D.   On: 11/22/2021 12:36   DG Chest 2 View  Result Date: 11/21/2021 CLINICAL DATA:  Leukocytosis EXAM: CHEST - 2 VIEW COMPARISON:  06/28/2021 FINDINGS: Transverse diameter of heart is increased. There is poor inspiration. There are no signs of alveolar pulmonary edema or focal pulmonary consolidation. There is no pleural effusion or pneumothorax. IMPRESSION: Cardiomegaly. Poor inspiration. There are no signs of pulmonary edema or focal pulmonary consolidation. Electronically Signed   By: Elmer Picker M.D.   On: 11/21/2021 18:40   DG Knee 1-2 Views Right  Result Date: 11/27/2021 CLINICAL DATA:  Right knee pain. EXAM: RIGHT KNEE - 1-2 VIEW COMPARISON:  None. FINDINGS: No fracture or dislocation. Severe tricompartmental degenerative change of the knee, worse within the medial compartment with near complete joint space loss, bone-on-bone articulation, subchondral sclerosis and osteophytosis. There is minimal lateral deviation of the tibial plateaus and relation to the femoral condyles. There are several loose bodies seen within the suprapatellar knee joint space though discrete donor sites are not identified. No definite joint effusion. No evidence of chondrocalcinosis. Suspected mild diffuse soft tissue swelling about the knee. No radiopaque foreign body. IMPRESSION: 1. Diffuse soft tissue swelling about the knee without associated fracture or dislocation. 2. 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 3. Loose bodies are identified within the suprapatellar knee joint space no discrete donor site is not identified, presumably the sequela of chronic advanced degenerative change.  Electronically Signed   By: Sandi Mariscal M.D.   On: 11/27/2021 09:33   CT HEAD WO CONTRAST (5MM)  Result Date: 11/26/2021 CLINICAL DATA:  Worsening dysarthria and lethargy, acute stroke on MRI yesterday EXAM: CT HEAD WITHOUT CONTRAST TECHNIQUE: Contiguous axial images were obtained from the base of the skull through the vertex without intravenous contrast. RADIATION DOSE REDUCTION: This exam was performed according to the departmental dose-optimization program which includes automated exposure control, adjustment of the mA and/or kV according to patient size and/or use of iterative reconstruction technique. COMPARISON:  11/25/2021 FINDINGS: Brain: Confluent hypodensities throughout the periventricular and subcortical white matter are again identified, unchanged since prior exam and consistent with chronic small vessel ischemic changes. The acute infarct  within the left frontal subcortical white matter on prior MRI is not readily apparent on CT. Chronic ischemic changes are also seen within the bilateral cerebellar hemispheres and left occipital lobe. No new infarct or hemorrhage. Lateral ventricles and midline structures are stable. No acute extra-axial fluid collections. No mass effect. Vascular: No hyperdense vessel or unexpected calcification. Skull: Normal. Negative for fracture or focal lesion. Sinuses/Orbits: No acute finding. Other: None. IMPRESSION: 1. Chronic small-vessel ischemic changes throughout the bilateral periventricular and subcortical white matter. The acute left frontal subcortical white matter infarcts seen on recent MRI are not readily apparent by CT. 2. 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 3. No new infarct or acute hemorrhage. Electronically Signed   By: Randa Ngo M.D.   On: 11/26/2021 16:48   CT HEAD WO CONTRAST (5MM)  Result Date: 11/25/2021 CLINICAL DATA:  Generalized weakness. EXAM: CT HEAD WITHOUT CONTRAST TECHNIQUE:  Contiguous axial images were obtained from the base of the skull through the vertex without intravenous contrast. RADIATION DOSE REDUCTION: This exam was performed according to the departmental dose-optimization program which includes automated exposure control, adjustment of the mA and/or kV according to patient size and/or use of iterative reconstruction technique. COMPARISON:  April 19, 2020. 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 No mass effect or midline shift is noted. Ventricular size is within normal limits. There is no evidence of mass lesion, hemorrhage or acute infarction. Vascular: No hyperdense vessel or unexpected calcification. Skull: Normal. Negative for fracture or focal lesion. Sinuses/Orbits: No acute finding. Other: None. IMPRESSION: No acute intracranial abnormality seen. Electronically Signed   By: Marijo Conception M.D.   On: 11/25/2021 11:53   MR BRAIN WO CONTRAST  Result Date: 11/25/2021 CLINICAL DATA:  Left-sided weakness, stroke suspected EXAM: MRI HEAD WITHOUT CONTRAST TECHNIQUE: Multiplanar, multiecho pulse sequences of the brain and surrounding structures were obtained without intravenous contrast. COMPARISON:  No prior MRI correlation is made with CT head 11/25/2021 FINDINGS: Evaluation is somewhat limited by motion and patient inability to cooperate, with a truncated exam obtained. Only axial and coronal diffusion-weighted sequences, axial FLAIR, and susceptibility weighted images were obtained. Within this limitation, restricted diffusion with ADC correlate in the left frontal lobe (series 9, images 32-35). No other restricted diffusion. No acute hemorrhage, mass, mass effect, or midline shift. Ventriculomegaly, which appears in proportion to sulcal size, likely reflecting greater than expected cerebral volume loss for age. Confluent T2 hyperintense signal in the periventricular white matter, likely the sequela  of severe chronic small vessel ischemic disease. 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 No extra-axial collection. No significant sinus mucosal thickening or fluid in the mastoid air cells. Evaluation the orbits is limited by motion. No diffusion restricting calvarial lesion. IMPRESSION: Evaluation is somewhat limited by motion and patient inability to cooperate with the exam, with only certain sequences obtained. 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 No significant mass effect or midline shift. These results will be called to the ordering clinician or representative by the Radiologist Assistant, and communication documented in the PACS or [employer]. Electronically Signed   By: Merilyn Baba M.D.   On: 11/25/2021 22:39   US RENAL  Result Date: 11/22/2021 CLINICAL DATA:  Renal dysfunction EXAM: RENAL / URINARY TRACT ULTRASOUND COMPLETE COMPARISON:  08/28/2020 FINDINGS: Right Kidney: Renal measurements:  9.4 x 5.3 x 4.8 cm = volume: 272.1 mL. There is increased cortical echogenicity. There is no hydronephrosis. There are multiple cysts in the right kidney largest measuring 5.4 x 5 x 5.6 cm in the upper pole. Left Kidney: Renal measurements: 13.6 x 7.6 x 5.9 cm = volume: 317.9 mL. There is no hydronephrosis. There is increased cortical echogenicity. There are multiple cysts of varying sizes largest measuring 5.7 x 4.9 x 5.1 cm in the midportion. Overall, no significant interval changes are noted. Bladder: Appears normal for degree of bladder distention. Other: None. IMPRESSION: There is no hydronephrosis. There is increased cortical echogenicity in both kidneys suggesting medical renal disease. Bilateral renal cysts. Electronically Signed   By: Elmer Picker M.D.   On: 11/22/2021 12:42   MR KNEE RIGHT WO CONTRAST  Result Date: 11/28/2021 CLINICAL DATA:   Pain and swelling.  [place] in September or October. EXAM: MRI OF THE RIGHT KNEE WITHOUT CONTRAST TECHNIQUE: Multiplanar, multisequence MR imaging of the knee was performed. No intravenous contrast was administered. COMPARISON:  None. FINDINGS: MENISCI Medial: Maceration of the anterior horn-body junction of the medial meniscus. Large complex tear of the posterior horn of the medial meniscus with a radial component at the root of the posterior horn. Lateral: Maceration of the anterior horn of the lateral meniscus. Degeneration of the body and posterior horn of the lateral meniscus. Oblique tear of the posterior horn of the lateral meniscus extending to the inferior articular surface. LIGAMENTS Cruciates: Intact PCL.  Complete chronic ACL tear. Collaterals: Thickening of the proximal MCL most consistent with proximal MCL strain without a complete tear. Lateral collateral ligament complex is intact. CARTILAGE Patellofemoral: Extensive full-thickness cartilage loss of the patellofemoral compartment. Medial: Extensive full-thickness cartilage loss of the medial femorotibial compartment. Lateral: High-grade partial-thickness cartilage loss with areas of full-thickness cartilage loss of the lateral femorotibial compartment. JOINT: Moderate joint effusion. Prominence of synovial fat in the suprapatellar joint space. Normal Hoffa's fat-pad. No plical thickening. POPLITEAL FOSSA: Mild tendinosis of the popliteus tendon. Small Baker's cyst. EXTENSOR MECHANISM: Intact quadriceps tendon. Intact patellar tendon. Intact lateral patellar retinaculum. Intact medial patellar retinaculum. Intact MPFL. BONES: No aggressive osseous lesion. No fracture or dislocation. Tricompartmental marginal osteophytes. Other: No fluid collection or hematoma. Muscles are normal. Generalized IMPRESSION: 1. 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 2.  Proximal MCL strain without a complete tear. 3. Maceration of the anterior horn-body junction of the medial meniscus. Large complex tear of the posterior horn of the medial meniscus with a radial component at the root of the posterior horn. 4. Maceration of the anterior horn of the lateral meniscus. Degeneration of the body and posterior horn of the lateral meniscus. Oblique tear of the posterior horn of the lateral meniscus extending to the inferior articular surface. 5. Complete chronic ACL tear. Electronically Signed   By: Kathreen Devoid M.D.   On: 11/28/2021 07:27   US Carotid Bilateral  Result Date: 11/27/2021 CLINICAL DATA:  History of hypertension and stroke. EXAM: BILATERAL CAROTID DUPLEX ULTRASOUND TECHNIQUE: Pearline Cables scale imaging, color Doppler and duplex ultrasound were performed of bilateral carotid and vertebral arteries in the neck. COMPARISON:  None. FINDINGS: Examination is degraded secondary to patient's inability to tolerate standard positioning. Criteria: Quantification of carotid stenosis is based on velocity parameters that correlate the residual internal carotid diameter with NASCET-based stenosis levels, using the diameter of the distal internal carotid lumen as the denominator for stenosis measurement. The  following velocity measurements were obtained: RIGHT ICA: 36/9 cm/sec CCA: 94/8 cm/sec SYSTOLIC ICA/CCA RATIO:  0.8 ECA: 52 cm/sec LEFT ICA: 87/15 cm/sec CCA: 54/6 cm/sec SYSTOLIC ICA/CCA RATIO:  2.6 ECA: 41 cm/sec RIGHT CAROTID ARTERY: There is a minimal amount of intimal wall thickening involving the left internal carotid artery, not resulting in elevated peak systolic velocities within the interrogated course of the left internal carotid artery to suggest a hemodynamically significant stenosis. RIGHT VERTEBRAL ARTERY:  Antegrade flow LEFT CAROTID ARTERY: There is a very minimal amount of wall thickening involving the left carotid bulb, origin and proximal aspect of the left internal  carotid artery, not resulting in elevated peak systolic velocities within the interrogated course of the left internal carotid artery to suggest a hemodynamically significant stenosis. LEFT VERTEBRAL ARTERY:  Antegrade flow IMPRESSION: Very minimal moderate bilateral intimal wall thickening, not resulting in a hemodynamically significant stenosis within either internal carotid artery. Electronically Signed   By: Sandi Mariscal M.D.   On: 11/27/2021 15:22   ECHOCARDIOGRAM COMPLETE  Result Date: 11/22/2021    ECHOCARDIOGRAM REPORT   Patient Name:   NIKASH MORTENSEN Date of Exam: 11/22/2021 Medical Rec #:  [id number]       Height:       72.0 in Accession #:    [id number]      Weight:       175.0 lb Date of Birth:  Feb 26, 1941       BSA:          2.013 m Patient Age:    29 years        BP:           110/91 mmHg Patient Gender: M               HR:           122 bpm. Exam Location:  [facility] Procedure: 2D Echo Indications:     Elevated Troponin  History:         Patient has prior history of Echocardiogram examinations, most                  recent 03/03/2018.  Sonographer:     Kathlen Brunswick RDCS Referring Phys:  [id number] [place] Diagnosing Phys: [place]  1. Left ventricular ejection fraction, by estimation, is 60 to 65%. The left ventricle has normal function. The left ventricle has no regional wall motion abnormalities. There is moderate concentric left ventricular hypertrophy. Left ventricular diastolic parameters are indeterminate.  2. Right ventricular systolic function is normal. The right ventricular size is normal.  3. Left atrial size was moderately dilated.  4. The mitral valve is normal in structure. No evidence of mitral valve regurgitation. No evidence of mitral stenosis.  5. The aortic valve is normal in structure. Aortic valve regurgitation is not visualized. No aortic stenosis is present.  6. The inferior vena cava is normal in size with greater than 50% respiratory variability, suggesting  right atrial pressure of 3 mmHg. FINDINGS  Left Ventricle: Left ventricular ejection fraction, by estimation, is 60 to 65%. The left ventricle has normal function. The left ventricle has no regional wall motion abnormalities. The left ventricular internal cavity size was normal in size. There is  moderate concentric left ventricular hypertrophy. Left ventricular diastolic parameters are indeterminate. Right Ventricle: The right ventricular size is normal. No increase in right ventricular wall thickness. Right ventricular systolic function is normal. Left Atrium: Left atrial size was moderately dilated. Right  Atrium: Right atrial size was normal in size. Pericardium: There is no evidence of pericardial effusion. Mitral Valve: The mitral valve is normal in structure. No evidence of mitral valve regurgitation. No evidence of mitral valve stenosis. Tricuspid Valve: The tricuspid valve is normal in structure. Tricuspid valve regurgitation is not demonstrated. No evidence of tricuspid stenosis. Aortic Valve: The aortic valve is normal in structure. Aortic valve regurgitation is not visualized. No aortic stenosis is present. Aortic valve peak gradient measures 3.6 mmHg. Pulmonic Valve: The pulmonic valve was normal in structure. Pulmonic valve regurgitation is not visualized. No evidence of pulmonic stenosis. Aorta: The aortic root is normal in size and structure. Venous: The inferior vena cava is normal in size with greater than 50% respiratory variability, suggesting right atrial pressure of 3 mmHg. IAS/Shunts: No atrial level shunt detected by color flow Doppler.  LEFT VENTRICLE PLAX 2D LVIDd:         3.60 cm   Diastology LVIDs:         2.50 cm   LV e' medial:    5.66 cm/s LV PW:         1.60 cm   LV E/e' medial:  14.2 LV IVS:        1.60 cm   LV e' lateral:   11.90 cm/s LVOT diam:     2.00 cm   LV E/e' lateral: 6.8 LV SV:         36 LV SV Index:   18 LVOT Area:     3.14 cm  RIGHT VENTRICLE RV Basal diam:  3.20 cm  TAPSE (M-mode): 1.7 cm LEFT ATRIUM             Index        RIGHT ATRIUM           Index LA diam:        5.40 cm 2.68 cm/m   RA Area:     19.50 cm LA Vol (A2C):   55.6 ml 27.62 ml/m  RA Volume:   59.20 ml  29.41 ml/m LA Vol (A4C):   46.2 ml 22.95 ml/m LA Biplane Vol: 51.6 ml 25.63 ml/m  AORTIC VALVE                 PULMONIC VALVE AV Area (Vmax): 3.04 cm     PV Vmax:       0.80 m/s AV Vmax:        95.00 cm/s   PV Peak grad:  2.6 mmHg AV Peak Grad:   3.6 mmHg LVOT Vmax:      92.00 cm/s LVOT Vmean:     49.100 cm/s LVOT VTI:       0.114 m  AORTA Ao Root diam: 3.30 cm Ao Asc diam:  3.10 cm MITRAL VALVE               TRICUSPID VALVE MV Area (PHT): 5.75 cm    TV Peak grad:   35.8 mmHg MV Decel Time: 132 msec    TV Vmax:        2.99 m/s MV E velocity: 80.60 cm/s                            SHUNTS                            Systemic VTI:  0.11 m  Systemic Diam: 2.00 cm Neoma Laming Electronically signed by Neoma Laming Signature Date/Time: 11/22/2021/2:07:39 PM    Final    US SCROTUM W/DOPPLER  Result Date: 11/23/2021 CLINICAL DATA:  Scrotal swelling x3 months EXAM: SCROTAL ULTRASOUND DOPPLER ULTRASOUND OF THE TESTICLES TECHNIQUE: Complete ultrasound examination of the testicles, epididymis, and other scrotal structures was performed. Color and spectral Doppler ultrasound were also utilized to evaluate blood flow to the testicles. COMPARISON:  September 13, 2018 FINDINGS: Right testicle Measurements: 5.0 x 3.3 x 1.9 cm. No mass or microlithiasis visualized. Left testicle Measurements: 3.9 x 3.1 x 3.0 cm. No mass or microlithiasis visualized. Right epididymis:  Normal in size and appearance. Left epididymis: Normal in size. Cysts in the left epididymis measuring up to 1.2 cm, containing some internal echoes. Hydrocele: Large left hydrocele containing minimal internal debris, similar to prior ultrasound from 2019. Varicocele:  None visualized. Pulsed Doppler interrogation of both testes  demonstrates normal low resistance arterial and venous waveforms bilaterally. IMPRESSION: 1. Unremarkable sonographic appearance of the bilateral testicles, no evidence torsion. 2. Large left hydrocele containing some minimal internal debris and left epididymal cysts are stable dating back to September 13, 2018. Electronically Signed   By: Dahlia Bailiff M.D.   On: 11/23/2021 10:52    Microbiology: Results for orders placed or performed during the hospital encounter of 11/22/21  Resp Panel by RT-PCR (Flu A&B, Covid) Nasopharyngeal Swab     Status: None   Collection Time: 11/22/21  1:00 AM   Specimen: Nasopharyngeal Swab; Nasopharyngeal(NP) swabs in vial transport medium  Result Value Ref Range Status   SARS Coronavirus 2 by RT PCR NEGATIVE NEGATIVE Final    Comment: (NOTE) SARS-CoV-2 target nucleic acids are NOT DETECTED.  The SARS-CoV-2 RNA is generally detectable in upper respiratory specimens during the acute phase of infection. The lowest concentration of SARS-CoV-2 viral copies this assay can detect is 138 copies/mL. A negative result does not preclude SARS-Cov-2 infection and should not be used as the sole basis for treatment or other patient management decisions. A negative result may occur with  improper specimen collection/handling, submission of specimen other than nasopharyngeal swab, presence of viral mutation(s) within the areas targeted by this assay, and inadequate number of viral copies(<138 copies/mL). A negative result must be combined with clinical observations, patient history, and epidemiological information. The expected result is Negative.  Fact Sheet for Patients:  EntrepreneurPulse.com.au  Fact Sheet for Healthcare Providers:  IncredibleEmployment.be  This test is no t yet approved or cleared by the [place] FDA and  has been authorized for detection and/or diagnosis of SARS-CoV-2 by FDA under an Emergency Use  Authorization (EUA). This EUA will remain  in effect (meaning this test can be used) for the duration of the COVID-19 declaration under Section 564(b)(1) of the Act, 21 U.S.C.section 360bbb-3(b)(1), unless the authorization is terminated  or revoked sooner.       Influenza A by PCR NEGATIVE NEGATIVE Final   Influenza B by PCR NEGATIVE NEGATIVE Final    Comment: (NOTE) The Xpert Xpress SARS-CoV-2/FLU/RSV plus assay is intended as an aid in the diagnosis of influenza from Nasopharyngeal swab specimens and should not be used as a sole basis for treatment. Nasal washings and aspirates are unacceptable for Xpert Xpress SARS-CoV-2/FLU/RSV testing.  Fact Sheet for Patients: EntrepreneurPulse.com.au  Fact Sheet for Healthcare Providers: IncredibleEmployment.be  This test is not yet approved or cleared by the [place] FDA and has been authorized for detection and/or diagnosis of SARS-CoV-2 by FDA  under an Emergency Use Authorization (EUA). This EUA will remain in effect (meaning this test can be used) for the duration of the COVID-19 declaration under Section 564(b)(1) of the Act, 21 U.S.C. section 360bbb-3(b)(1), unless the authorization is terminated or revoked.  Performed at [facility], [address] [place], [place] [address]   Body fluid culture w Gram Stain     Status: None (Preliminary result)   Collection Time: 11/27/21  3:35 PM   Specimen: Synovium; Body Fluid  Result Value Ref Range Status   Specimen Description   Final    SYNOVIAL Performed at [facility], [address] [place], [place] [address]    Special Requests   Final    RIGHT KNEE Performed at [facility], [address] [place], [place] [address]    Gram Stain   Final    FEW WBC PRESENT,BOTH PMN AND MONONUCLEAR NO ORGANISMS SEEN    Culture   Final    NO GROWTH < 12 HOURS Performed at [facility], [place] [address],  [place], [place] [address]    Report Status PENDING  Incomplete  Body fluid culture w Gram Stain     Status: None (Preliminary result)   Collection Time: 11/27/21  3:38 PM   Specimen: Synovium; Body Fluid  Result Value Ref Range Status   Specimen Description   Final    SYNOVIAL Performed at [facility], [address] [place], [place] [address]    Special Requests   Final    LEFT KNEE Performed at [facility], [address] [place], [place] [address]    Gram Stain   Final    FEW WBC PRESENT,BOTH PMN AND MONONUCLEAR NO ORGANISMS SEEN    Culture   Final    NO GROWTH < 12 HOURS Performed at [facility], [place] [address], [place], [place] [address]    Report Status PENDING  Incomplete  Resp Panel by RT-PCR (Flu A&B, Covid) Nasopharyngeal Swab     Status: None   Collection Time: 11/28/21 12:27 PM   Specimen: Nasopharyngeal Swab; Nasopharyngeal(NP) swabs in vial transport medium  Result Value Ref Range Status   SARS Coronavirus 2 by RT PCR NEGATIVE NEGATIVE Final    Comment: (NOTE) SARS-CoV-2 target nucleic acids are NOT DETECTED.  The SARS-CoV-2 RNA is generally detectable in upper respiratory specimens during the acute phase of infection. The lowest concentration of SARS-CoV-2 viral copies this assay can detect is 138 copies/mL. A negative result does not preclude SARS-Cov-2 infection and should not be used as the sole basis for treatment or other patient management decisions. A negative result may occur with  improper specimen collection/handling, submission of specimen other than nasopharyngeal swab, presence of viral mutation(s) within the areas targeted by this assay, and inadequate number of viral copies(<138 copies/mL). A negative result must be combined with clinical observations, patient history, and epidemiological information. The expected result is Negative.  Fact Sheet for Patients:  EntrepreneurPulse.com.au  Fact Sheet  for Healthcare Providers:  IncredibleEmployment.be  This test is no t yet approved or cleared by the [place] FDA and  has been authorized for detection and/or diagnosis of SARS-CoV-2 by FDA under an Emergency Use Authorization (EUA). This EUA will remain  in effect (meaning this test can be used) for the duration of the COVID-19 declaration under Section 564(b)(1) of the Act, 21 U.S.C.section 360bbb-3(b)(1), unless the authorization is terminated  or revoked sooner.  Influenza A by PCR NEGATIVE NEGATIVE Final   Influenza B by PCR NEGATIVE NEGATIVE Final    Comment: (NOTE) The Xpert Xpress SARS-CoV-2/FLU/RSV plus assay is intended as an aid in the diagnosis of influenza from Nasopharyngeal swab specimens and should not be used as a sole basis for treatment. Nasal washings and aspirates are unacceptable for Xpert Xpress SARS-CoV-2/FLU/RSV testing.  Fact Sheet for Patients: EntrepreneurPulse.com.au  Fact Sheet for Healthcare Providers: IncredibleEmployment.be  This test is not yet approved or cleared by the [place] FDA and has been authorized for detection and/or diagnosis of SARS-CoV-2 by FDA under an Emergency Use Authorization (EUA). This EUA will remain in effect (meaning this test can be used) for the duration of the COVID-19 declaration under Section 564(b)(1) of the Act, 21 U.S.C. section 360bbb-3(b)(1), unless the authorization is terminated or revoked.  Performed at [facility], [address] [place], [place] [address]     Labs: CBC: Recent Labs  Lab 11/21/21 1803 11/22/21 0515 11/23/21 0613 11/26/21 0428  WBC 17.3* 12.1* 8.8 11.8*  NEUTROABS  --   --  6.7 9.6*  HGB 14.8 14.1 11.9* 13.0  HCT 46.0 43.2 36.9* 39.7  MCV 85.3 83.1 84.2 83.2  PLT 198 170 167 800   Basic Metabolic Panel: Recent Labs  Lab 11/24/21 0625 11/25/21 0517 11/26/21 0428 11/27/21 0444 11/28/21 0618   NA 135 137 138 138 133*  K 4.7 4.1 4.5 4.4 4.2  CL 107 107 107 111 108  CO2 18* 21* 20* 20* 20*  GLUCOSE 105* 103* 124* 113* 162*  BUN 47* 42* 43* 40* 46*  CREATININE 2.97* 3.02* 3.05* 3.14* 2.80*  CALCIUM 8.9 8.7* 8.9 8.7* 8.3*  MG  --  2.2 2.2  --   --   PHOS  --   --  3.1  --   --     CBG: Recent Labs  Lab 11/27/21 1157 11/27/21 1638 11/27/21 2205 11/28/21 0748 11/28/21 1155  GLUCAP 215* 147* 157* 120* 183*    Discharge time spent: greater than 30 minutes.  Signed: Loletha Grayer, MD [facility] 11/28/2021

## 2021-11-28 NOTE — Plan of Care (Signed)
PMT note:  In to follow up with patient, but patient is currently off the floor. Will follow up next week on PMT return.

## 2021-11-28 NOTE — Progress Notes (Signed)
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  Cultures are negative to date of the synovial fluid.  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  Patient has severe tricompartmental osteoarthritis.  No acute surgery is recommended.  If the patient has persistent right knee pain he would be a candidate to proceed with a right total knee arthroplasty.  This would be set up as an outpatient elective surgery in the future.  Patient can follow-up with rheumatology for management of his gout.  If he has persistent knee pain he may follow-up in the orthopedic office for outpatient intra-articular corticosteroid injection.  If patient is demonstrating significant right knee instability with physical therapy,a neoprene hinged knee braces may be ordered if available at [facility].  Otherwise he may need to use a knee immobilizer while standing or ambulating with PT to prevent instability and possible fall.  Objective:   VITALS:   Vitals:   11/28/21 0558 11/28/21 0747 11/28/21 0816 11/28/21 1131  BP: 109/61 (!) 128/94  107/75  Pulse: (!) 109 (!) 54 64 89  Resp: 20 19  18   Temp: 98.7 F (37.1 C) 97.9 F (36.6 C)  97.8 F (36.6 C)  TempSrc:  Oral    SpO2: 100% 100%  97%  Weight:      Height:        LABS  Results for orders placed or performed during the hospital encounter of 11/22/21 (from the past 24 hour(s))  Body fluid culture w Gram Stain     Status: None (Preliminary result)   Collection Time: 11/27/21  3:35 PM   Specimen: Synovium; Body Fluid  Result Value Ref Range   Specimen Description      SYNOVIAL Performed at [facility], [address] [place], [place] [address]    Special Requests      RIGHT KNEE Performed at [facility], [address] [place], [place] [address]    Gram Stain      FEW WBC PRESENT,BOTH PMN AND MONONUCLEAR NO ORGANISMS SEEN    Culture       NO GROWTH < 12 HOURS Performed at [facility], [place] [address] [place], [place] [address]    Report Status PENDING   Synovial cell count + diff, w/ crystals     Status: Abnormal   Collection Time: 11/27/21  3:37 PM  Result Value Ref Range   Color, Synovial YELLOW (A) YELLOW   Appearance-Synovial CLOUDY (A) CLEAR   Crystals, Fluid INTRACELLULAR MONOSODIUM URATE CRYSTALS    WBC, Synovial 18,546 (H) 0 - 200 /cu mm   Neutrophil, Synovial 94 %   Lymphocytes-Synovial Fld 0 %   Monocyte-Macrophage-Synovial Fluid 6 %   Eosinophils-Synovial 0 %  Body fluid culture w Gram Stain     Status: None (Preliminary result)   Collection Time: 11/27/21  3:38 PM   Specimen: Synovium; Body Fluid  Result Value Ref Range   Specimen Description      SYNOVIAL Performed at [facility], [address] [place], [place] [address]    Special Requests      LEFT KNEE Performed at [facility], [place], [place] [address]    Gram Stain      FEW WBC PRESENT,BOTH PMN AND MONONUCLEAR NO ORGANISMS SEEN    Culture      NO GROWTH < 12 HOURS Performed at [facility], [place] [address], [place], [place] [address]    Report Status PENDING   Glucose, capillary     Status: Abnormal   Collection Time: 11/27/21  4:38 PM  Result Value Ref Range   Glucose-Capillary 147 (H) 70 - 99 mg/dL  Synovial cell count + diff, w/ crystals     Status: Abnormal   Collection Time: 11/27/21  4:43 PM  Result Value Ref Range   Color, Synovial YELLOW (A) YELLOW   Appearance-Synovial CLOUDY (A) CLEAR   Crystals, Fluid INTRACELLULAR MONOSODIUM URATE CRYSTALS    WBC, Synovial 14,370 (H) 0 - 200 /cu mm   Neutrophil, Synovial 84 %   Lymphocytes-Synovial Fld 0 %   Monocyte-Macrophage-Synovial Fluid 16 %   Eosinophils-Synovial 0 %  Glucose, capillary     Status: Abnormal   Collection Time: 11/27/21 10:05 PM  Result Value Ref Range   Glucose-Capillary 157 (H) 70 - 99 mg/dL  Basic  metabolic panel     Status: Abnormal   Collection Time: 11/28/21  6:18 AM  Result Value Ref Range   Sodium 133 (L) 135 - 145 mmol/L   Potassium 4.2 3.5 - 5.1 mmol/L   Chloride 108 98 - 111 mmol/L   CO2 20 (L) 22 - 32 mmol/L   Glucose, Bld 162 (H) 70 - 99 mg/dL   BUN 46 (H) 8 - 23 mg/dL   Creatinine, Ser 2.80 (H) 0.61 - 1.24 mg/dL   Calcium 8.3 (L) 8.9 - 10.3 mg/dL   GFR, Estimated 22 (L) >60 mL/min   Anion gap 5 5 - 15  Glucose, capillary     Status: Abnormal   Collection Time: 11/28/21  7:48 AM  Result Value Ref Range   Glucose-Capillary 120 (H) 70 - 99 mg/dL  Glucose, capillary     Status: Abnormal   Collection Time: 11/28/21 11:55 AM  Result Value Ref Range   Glucose-Capillary 183 (H) 70 - 99 mg/dL  Resp Panel by RT-PCR (Flu A&B, Covid) Nasopharyngeal Swab     Status: None   Collection Time: 11/28/21 12:27 PM   Specimen: Nasopharyngeal Swab; Nasopharyngeal(NP) swabs in vial transport medium  Result Value Ref Range   SARS Coronavirus 2 by RT PCR NEGATIVE NEGATIVE   Influenza A by PCR NEGATIVE NEGATIVE   Influenza B by PCR NEGATIVE NEGATIVE    DG Knee 1-2 Views Right  Result Date: 11/27/2021 CLINICAL DATA:  Right knee pain. EXAM: RIGHT KNEE - 1-2 VIEW COMPARISON:  None. FINDINGS: No fracture or dislocation. Severe tricompartmental degenerative change of the knee, worse within the medial compartment with near complete joint space loss, bone-on-bone articulation, subchondral sclerosis and osteophytosis. There is minimal lateral deviation of the tibial plateaus and relation to the femoral condyles. There are several loose bodies seen within the suprapatellar knee joint space though discrete donor sites are not identified. No definite joint effusion. No evidence of chondrocalcinosis. Suspected mild diffuse soft tissue swelling about the knee. No radiopaque foreign body. IMPRESSION: 1. Diffuse soft tissue swelling about the knee without associated fracture or dislocation. 2. Severe  tricompartmental degenerative change of the knee, worst in the medial compartment. 3. Loose bodies are identified within the suprapatellar knee joint space no discrete donor site is not identified, presumably the sequela of chronic advanced degenerative change. Electronically Signed   By: Sandi Mariscal M.D.   On: 11/27/2021 09:33   CT HEAD WO CONTRAST (5MM)  Result Date: 11/26/2021 CLINICAL DATA:  Worsening dysarthria and lethargy, acute stroke on MRI  yesterday EXAM: CT HEAD WITHOUT CONTRAST TECHNIQUE: Contiguous axial images were obtained from the base of the skull through the vertex without intravenous contrast. RADIATION DOSE REDUCTION: This exam was performed according to the departmental dose-optimization program which includes automated exposure control, adjustment of the mA and/or kV according to patient size and/or use of iterative reconstruction technique. COMPARISON:  11/25/2021 FINDINGS: Brain: Confluent hypodensities throughout the periventricular and subcortical white matter are again identified, unchanged since prior exam and consistent with chronic small vessel ischemic changes. The acute infarct within the left frontal subcortical white matter on prior MRI is not readily apparent on CT. Chronic ischemic changes are also seen within the bilateral cerebellar hemispheres and left occipital lobe. No new infarct or hemorrhage. Lateral ventricles and midline structures are stable. No acute extra-axial fluid collections. No mass effect. Vascular: No hyperdense vessel or unexpected calcification. Skull: Normal. Negative for fracture or focal lesion. Sinuses/Orbits: No acute finding. Other: None. IMPRESSION: 1. Chronic small-vessel ischemic changes throughout the bilateral periventricular and subcortical white matter. The acute left frontal subcortical white matter infarcts seen on recent MRI are not readily apparent by CT. 2. 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 3. No new infarct or acute hemorrhage. Electronically Signed   By: Randa Ngo M.D.   On: 11/26/2021 16:48   MR KNEE RIGHT WO CONTRAST  Result Date: 11/28/2021 CLINICAL DATA:  Pain and swelling.  [place] in September or October. EXAM: MRI OF THE RIGHT KNEE WITHOUT CONTRAST TECHNIQUE: Multiplanar, multisequence MR imaging of the knee was performed. No intravenous contrast was administered. COMPARISON:  None. FINDINGS: MENISCI Medial: Maceration of the anterior horn-body junction of the medial meniscus. Large complex tear of the posterior horn of the medial meniscus with a radial component at the root of the posterior horn. Lateral: Maceration of the anterior horn of the lateral meniscus. Degeneration of the body and posterior horn of the lateral meniscus. Oblique tear of the posterior horn of the lateral meniscus extending to the inferior articular surface. LIGAMENTS Cruciates: Intact PCL.  Complete chronic ACL tear. Collaterals: Thickening of the proximal MCL most consistent with proximal MCL strain without a complete tear. Lateral collateral ligament complex is intact. CARTILAGE Patellofemoral: Extensive full-thickness cartilage loss of the patellofemoral compartment. Medial: Extensive full-thickness cartilage loss of the medial femorotibial compartment. Lateral: High-grade partial-thickness cartilage loss with areas of full-thickness cartilage loss of the lateral femorotibial compartment. JOINT: Moderate joint effusion. Prominence of synovial fat in the suprapatellar joint space. Normal Hoffa's fat-pad. No plical thickening. POPLITEAL FOSSA: Mild tendinosis of the popliteus tendon. Small Baker's cyst. EXTENSOR MECHANISM: Intact quadriceps tendon. Intact patellar tendon. Intact lateral patellar retinaculum. Intact medial patellar retinaculum. Intact MPFL. BONES: No aggressive osseous lesion. No fracture or dislocation. Tricompartmental marginal osteophytes. Other: No fluid collection or  hematoma. Muscles are normal. Generalized IMPRESSION: 1. 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 2. Proximal MCL strain without a complete tear. 3. Maceration of the anterior horn-body junction of the medial meniscus. Large complex tear of the posterior horn of the medial meniscus with a radial component at the root of the posterior horn. 4. Maceration of the anterior horn of the lateral meniscus. Degeneration of the body and posterior horn of the lateral meniscus. Oblique tear of the posterior horn of the lateral meniscus extending to the inferior articular surface. 5. Complete chronic ACL tear. Electronically Signed   By: [place] Service.D.  On: 11/28/2021 07:27   US Carotid Bilateral  Result Date: 11/27/2021 CLINICAL DATA:  History of hypertension and stroke. EXAM: BILATERAL CAROTID DUPLEX ULTRASOUND TECHNIQUE: Pearline Cables scale imaging, color Doppler and duplex ultrasound were performed of bilateral carotid and vertebral arteries in the neck. COMPARISON:  None. FINDINGS: Examination is degraded secondary to patient's inability to tolerate standard positioning. Criteria: Quantification of carotid stenosis is based on velocity parameters that correlate the residual internal carotid diameter with NASCET-based stenosis levels, using the diameter of the distal internal carotid lumen as the denominator for stenosis measurement. The following velocity measurements were obtained: RIGHT ICA: 36/9 cm/sec CCA: 17/4 cm/sec SYSTOLIC ICA/CCA RATIO:  0.8 ECA: 52 cm/sec LEFT ICA: 87/15 cm/sec CCA: 08/1 cm/sec SYSTOLIC ICA/CCA RATIO:  2.6 ECA: 41 cm/sec RIGHT CAROTID ARTERY: There is a minimal amount of intimal wall thickening involving the left internal carotid artery, not resulting in elevated peak systolic velocities within the interrogated course of the left internal carotid artery to suggest a hemodynamically significant  stenosis. RIGHT VERTEBRAL ARTERY:  Antegrade flow LEFT CAROTID ARTERY: There is a very minimal amount of wall thickening involving the left carotid bulb, origin and proximal aspect of the left internal carotid artery, not resulting in elevated peak systolic velocities within the interrogated course of the left internal carotid artery to suggest a hemodynamically significant stenosis. LEFT VERTEBRAL ARTERY:  Antegrade flow IMPRESSION: Very minimal moderate bilateral intimal wall thickening, not resulting in a hemodynamically significant stenosis within either internal carotid artery. Electronically Signed   By: Sandi Mariscal M.D.   On: 11/27/2021 15:22    Assessment/Plan:     Principal Problem:   Acute embolic stroke [facility]) Active Problems:   Gout   Atrial fibrillation (HCC)   Chronic diastolic congestive heart failure ([place])   Acute kidney injury superimposed on chronic kidney disease ([place])   Community acquired bilateral lower lobe pneumonia   Metabolic acidemia      [place] , MD 11/28/2021, 3:15 PM

## 2021-11-28 NOTE — Progress Notes (Signed)
Report call today to Judith Blonder  LPN  at [facility], patient going to room 511

## 2021-11-28 NOTE — Progress Notes (Signed)
Occupational Therapy Treatment Patient Details Name: George Bolton MRN: [id number] DOB: September 29, 1941 Today's Date: 11/28/2021   History of present illness Pt is an 81 y.o. M admitted on 11/22/21 after presenting to the ED with acute onset of generalized weakness with diminished p.o. intake & abnormal labs. 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 Pt also has bilateral lower lobe CAP. 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 PMH: chronic a-fib, chronic diastolic CHF, gout, HTN.   OT comments  Pt seen for OT tx this date focused on sitting balance and activity tolerance within the context of bed mobility and seated grooming and UB bathing. Pt required MOD A for bed mobility (trunk support sup>sit, BLE mgt sit>sup). Initially, pt required BUE support on EOB/bed rail to support himself with MIN A improving to CGA with no UE support EOB, and supervision with UE support EOB with cues to maintain static sitting balance. Pt tolerated sitting EOB ~59min for UB bathing (MAX A to wash back) and grooming tasks (set up). Pt progressing towards goals and continues to benefit to maximize safety/indep and minimize caregiver burden.    Recommendations for follow up therapy are one component of a multi-disciplinary discharge planning process, led by the attending physician.  Recommendations may be updated based on patient status, additional functional criteria and insurance authorization.    Follow Up Recommendations  Skilled nursing-short term rehab (<3 hours/day)    Assistance Recommended at Discharge Frequent or constant Supervision/Assistance  Patient can return home with the following  Two people to help with walking and/or transfers;A lot of help with bathing/dressing/bathroom;Assistance with cooking/housework;Direct supervision/assist for medications  management;Assist for transportation;Help with stairs or ramp for entrance   Equipment Recommendations       Recommendations for Other Services      Precautions / Restrictions Precautions Precautions: Fall Restrictions Weight Bearing Restrictions: No Other Position/Activity Restrictions: bil knee injections 2/2, bandaged       Mobility Bed Mobility Overal bed mobility: Needs Assistance Bed Mobility: Supine to Sit, Sit to Supine     Supine to sit: Mod assist, HOB elevated Sit to supine: Mod assist   General bed mobility comments: MOD A for trunk elevation to EOB, BLE mgt back to bed    Transfers                         Balance Overall balance assessment: Needs assistance Sitting-balance support: Single extremity supported, Bilateral upper extremity supported, No upper extremity supported, Feet supported Sitting balance-Leahy Scale: Fair Sitting balance - Comments: initially poor requiring BUE support, improving to fair with supv-CGA                                   ADL either performed or assessed with clinical judgement   ADL Overall ADL's : Needs assistance/impaired     Grooming: Sitting;Set up;Min guard;Wash/dry face;Wash/dry Nurse, mental health Details (indicate cue type and reason): CGA for static sitting balance Upper Body Bathing: Sitting Upper Body Bathing Details (indicate cue type and reason): MAX A to wash back                                Extremity/Trunk Assessment  Vision       Perception     Praxis      Cognition Arousal/Alertness: Awake/alert Behavior During Therapy: WFL for tasks assessed/performed Overall Cognitive Status: No family/caregiver present to determine baseline cognitive functioning                                 General Comments: pt mumbles but with VC will increase speech volume and is more clearly understood and appropriate, does well with 1 step commands         Exercises Other Exercises Other Exercises: pt tolerated sitting EOB ~61min with supv to CGA for sitting balance for UB bathing and grooming tasks.    Shoulder Instructions       General Comments      Pertinent Vitals/ Pain       Pain Assessment Pain Assessment: Faces Faces Pain Scale: Hurts a little bit Pain Location: bil knees with initial bed mobility, improving over session Pain Descriptors / Indicators: Grimacing, Guarding Pain Intervention(s): Limited activity within patient's tolerance, Monitored during session, Premedicated before session, Repositioned  Home Living                                          Prior Functioning/Environment              Frequency  Min 2X/week        Progress Toward Goals  OT Goals(current goals can now be found in the care plan section)  Progress towards OT goals: Progressing toward goals  Acute Rehab OT Goals Patient Stated Goal: to go home OT Goal Formulation: With patient Time For Goal Achievement: 12/07/21 Potential to Achieve Goals: Good  Plan Discharge plan remains appropriate;Frequency remains appropriate    Co-evaluation                 AM-PAC OT "6 Clicks" Daily Activity     Outcome Measure   Help from another person eating meals?: A Little Help from another person taking care of personal grooming?: A Little Help from another person toileting, which includes using toliet, bedpan, or urinal?: A Lot Help from another person bathing (including washing, rinsing, drying)?: A Lot Help from another person to put on and taking off regular upper body clothing?: A Lot Help from another person to put on and taking off regular lower body clothing?: A Lot 6 Click Score: 14    End of Session    OT Visit Diagnosis: Unsteadiness on feet (R26.81);Muscle weakness (generalized) (M62.81);Pain Pain - Right/Left: Left Pain - part of body: Knee   Activity Tolerance Patient tolerated treatment  well   Patient Left in bed;with call bell/phone within reach;with bed alarm set   Nurse Communication          Time: 5956-3875 OT Time Calculation (min): 18 min  Charges: OT General Charges $OT Visit: 1 Visit OT Treatments $Self Care/Home Management : 8-22 mins  Ardeth Perfect., MPH, MS, OTR/L ascom [phone] 11/28/21, 11:50 AM

## 2021-11-28 NOTE — Progress Notes (Signed)
[facility]  ROUNDING NOTE   Subjective:   Patient seen sitting up in bed, completed breakfast tray at bedside Patient's son at bedside Patient has good appetite and tolerating meals, but son points out poor fluid intake Denies shortness of breath and remains on room air No lower extremity edema  Creatinine improved to 2.8 Urine output 1.4 L recorded in preceding 24 hours   Objective:  Vital signs in last 24 hours:  Temp:  [97.5 F (36.4 C)-99.7 F (37.6 C)] 97.9 F (36.6 C) (02/03 0747) Pulse Rate:  [52-119] 64 (02/03 0816) Resp:  [15-20] 19 (02/03 0747) BP: (106-128)/(61-94) 128/94 (02/03 0747) SpO2:  [92 %-100 %] 100 % (02/03 0747)  Weight change:  Filed Weights   11/21/21 1800  Weight: 79.4 kg    Intake/Output: I/O last 3 completed shifts: In: 1295.7 [P.O.:480; I.V.:815.7] Out: 1600 [Urine:1600]   Intake/Output this shift:  Total I/O In: 360 [P.O.:360] Out: -   Physical Exam: General: NAD  Head: Normocephalic, atraumatic. Moist oral mucosal membranes  Eyes: Anicteric  Lungs:  Clear to auscultation, normal effort, room air  Heart: Regular rate and rhythm  Abdomen:  Soft, nontender, nondistended  Extremities: No peripheral edema.  Neurologic: Alert, moving all four extremities  Skin: No lesions       Basic Metabolic Panel: Recent Labs  Lab 11/24/21 0625 11/25/21 0517 11/26/21 0428 11/27/21 0444 11/28/21 0618  NA 135 137 138 138 133*  K 4.7 4.1 4.5 4.4 4.2  CL 107 107 107 111 108  CO2 18* 21* 20* 20* 20*  GLUCOSE 105* 103* 124* 113* 162*  BUN 47* 42* 43* 40* 46*  CREATININE 2.97* 3.02* 3.05* 3.14* 2.80*  CALCIUM 8.9 8.7* 8.9 8.7* 8.3*  MG  --  2.2 2.2  --   --   PHOS  --   --  3.1  --   --      Liver Function Tests: No results for input(s): AST, ALT, ALKPHOS, BILITOT, PROT, ALBUMIN in the last 168 hours. No results for input(s): LIPASE, AMYLASE in the last 168 hours. No results for input(s): AMMONIA in the last 168  hours.  CBC: Recent Labs  Lab 11/21/21 1803 11/22/21 0515 11/23/21 0613 11/26/21 0428  WBC 17.3* 12.1* 8.8 11.8*  NEUTROABS  --   --  6.7 9.6*  HGB 14.8 14.1 11.9* 13.0  HCT 46.0 43.2 36.9* 39.7  MCV 85.3 83.1 84.2 83.2  PLT 198 170 167 240     Cardiac Enzymes: No results for input(s): CKTOTAL, CKMB, CKMBINDEX, TROPONINI in the last 168 hours.  BNP: Invalid input(s): POCBNP  CBG: Recent Labs  Lab 11/27/21 0736 11/27/21 1157 11/27/21 1638 11/27/21 2205 11/28/21 0748  GLUCAP 102* 215* 147* 157* 120*     Microbiology: Results for orders placed or performed during the hospital encounter of 11/22/21  Resp Panel by RT-PCR (Flu A&B, Covid) Nasopharyngeal Swab     Status: None   Collection Time: 11/22/21  1:00 AM   Specimen: Nasopharyngeal Swab; Nasopharyngeal(NP) swabs in vial transport medium  Result Value Ref Range Status   SARS Coronavirus 2 by RT PCR NEGATIVE NEGATIVE Final    Comment: (NOTE) SARS-CoV-2 target nucleic acids are NOT DETECTED.  The SARS-CoV-2 RNA is generally detectable in upper respiratory specimens during the acute phase of infection. The lowest concentration of SARS-CoV-2 viral copies this assay can detect is 138 copies/mL. A negative result does not preclude SARS-Cov-2 infection and should not be used as the sole basis for  treatment or other patient management decisions. A negative result may occur with  improper specimen collection/handling, submission of specimen other than nasopharyngeal swab, presence of viral mutation(s) within the areas targeted by this assay, and inadequate number of viral copies(<138 copies/mL). A negative result must be combined with clinical observations, patient history, and epidemiological information. The expected result is Negative.  Fact Sheet for Patients:  EntrepreneurPulse.com.au  Fact Sheet for Healthcare Providers:  IncredibleEmployment.be  This test is no t yet  approved or cleared by the [place] FDA and  has been authorized for detection and/or diagnosis of SARS-CoV-2 by FDA under an Emergency Use Authorization (EUA). This EUA will remain  in effect (meaning this test can be used) for the duration of the COVID-19 declaration under Section 564(b)(1) of the Act, 21 U.S.C.section 360bbb-3(b)(1), unless the authorization is terminated  or revoked sooner.       Influenza A by PCR NEGATIVE NEGATIVE Final   Influenza B by PCR NEGATIVE NEGATIVE Final    Comment: (NOTE) The Xpert Xpress SARS-CoV-2/FLU/RSV plus assay is intended as an aid in the diagnosis of influenza from Nasopharyngeal swab specimens and should not be used as a sole basis for treatment. Nasal washings and aspirates are unacceptable for Xpert Xpress SARS-CoV-2/FLU/RSV testing.  Fact Sheet for Patients: EntrepreneurPulse.com.au  Fact Sheet for Healthcare Providers: IncredibleEmployment.be  This test is not yet approved or cleared by the [place] FDA and has been authorized for detection and/or diagnosis of SARS-CoV-2 by FDA under an Emergency Use Authorization (EUA). This EUA will remain in effect (meaning this test can be used) for the duration of the COVID-19 declaration under Section 564(b)(1) of the Act, 21 U.S.C. section 360bbb-3(b)(1), unless the authorization is terminated or revoked.  Performed at [facility], [address] [place], [place] [address]   Body fluid culture w Gram Stain     Status: None (Preliminary result)   Collection Time: 11/27/21  3:35 PM   Specimen: Synovium; Body Fluid  Result Value Ref Range Status   Specimen Description   Final    SYNOVIAL Performed at [facility], [address] [place], [place] [address]    Special Requests   Final    RIGHT KNEE Performed at [facility], [address] [place], [place] [address]    Gram Stain   Final    FEW WBC  PRESENT,BOTH PMN AND MONONUCLEAR NO ORGANISMS SEEN    Culture   Final    NO GROWTH < 12 HOURS Performed at [facility], [place] [address], [place], [place] [address]    Report Status PENDING  Incomplete  Body fluid culture w Gram Stain     Status: None (Preliminary result)   Collection Time: 11/27/21  3:38 PM   Specimen: Synovium; Body Fluid  Result Value Ref Range Status   Specimen Description   Final    SYNOVIAL Performed at [facility], [address] [place], [place] [address]    Special Requests   Final    LEFT KNEE Performed at [facility], [address] [place], [place] [address]    Gram Stain   Final    FEW WBC PRESENT,BOTH PMN AND MONONUCLEAR NO ORGANISMS SEEN    Culture   Final    NO GROWTH < 12 HOURS Performed at [facility], [place] [address] [place], [place] [address]    Report Status PENDING  Incomplete    Coagulation Studies: No results for input(s):  LABPROT, INR in the last 72 hours.  Urinalysis: No results for input(s): COLORURINE, LABSPEC, PHURINE, GLUCOSEU, HGBUR, BILIRUBINUR, KETONESUR, PROTEINUR, UROBILINOGEN, NITRITE, LEUKOCYTESUR in the last 72 hours.  Invalid input(s): APPERANCEUR    Imaging: DG Knee 1-2 Views Right  Result Date: 11/27/2021 CLINICAL DATA:  Right knee pain. EXAM: RIGHT KNEE - 1-2 VIEW COMPARISON:  None. FINDINGS: No fracture or dislocation. Severe tricompartmental degenerative change of the knee, worse within the medial compartment with near complete joint space loss, bone-on-bone articulation, subchondral sclerosis and osteophytosis. There is minimal lateral deviation of the tibial plateaus and relation to the femoral condyles. There are several loose bodies seen within the suprapatellar knee joint space though discrete donor sites are not identified. No definite joint effusion. No evidence of chondrocalcinosis. Suspected mild diffuse soft tissue swelling about the knee. No radiopaque foreign body.  IMPRESSION: 1. Diffuse soft tissue swelling about the knee without associated fracture or dislocation. 2. 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 3. Loose bodies are identified within the suprapatellar knee joint space no discrete donor site is not identified, presumably the sequela of chronic advanced degenerative change. Electronically Signed   By: Sandi Mariscal M.D.   On: 11/27/2021 09:33   CT HEAD WO CONTRAST (5MM)  Result Date: 11/26/2021 CLINICAL DATA:  Worsening dysarthria and lethargy, acute stroke on MRI yesterday EXAM: CT HEAD WITHOUT CONTRAST TECHNIQUE: Contiguous axial images were obtained from the base of the skull through the vertex without intravenous contrast. RADIATION DOSE REDUCTION: This exam was performed according to the departmental dose-optimization program which includes automated exposure control, adjustment of the mA and/or kV according to patient size and/or use of iterative reconstruction technique. COMPARISON:  11/25/2021 FINDINGS: Brain: Confluent hypodensities throughout the periventricular and subcortical white matter are again identified, unchanged since prior exam and consistent with chronic small vessel ischemic changes. The acute infarct within the left frontal subcortical white matter on prior MRI is not readily apparent on CT. Chronic ischemic changes are also seen within the bilateral cerebellar hemispheres and left occipital lobe. No new infarct or hemorrhage. Lateral ventricles and midline structures are stable. No acute extra-axial fluid collections. No mass effect. Vascular: No hyperdense vessel or unexpected calcification. Skull: Normal. Negative for fracture or focal lesion. Sinuses/Orbits: No acute finding. Other: None. IMPRESSION: 1. Chronic small-vessel ischemic changes throughout the bilateral periventricular and subcortical white matter. The acute left frontal subcortical white matter infarcts seen on recent MRI are  not readily apparent by CT. 2. 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 3. No new infarct or acute hemorrhage. Electronically Signed   By: Randa Ngo M.D.   On: 11/26/2021 16:48   MR KNEE RIGHT WO CONTRAST  Result Date: 11/28/2021 CLINICAL DATA:  Pain and swelling.  [place] in September or October. EXAM: MRI OF THE RIGHT KNEE WITHOUT CONTRAST TECHNIQUE: Multiplanar, multisequence MR imaging of the knee was performed. No intravenous contrast was administered. COMPARISON:  None. FINDINGS: MENISCI Medial: Maceration of the anterior horn-body junction of the medial meniscus. Large complex tear of the posterior horn of the medial meniscus with a radial component at the root of the posterior horn. Lateral: Maceration of the anterior horn of the lateral meniscus. Degeneration of the body and posterior horn of the lateral meniscus. Oblique tear of the posterior horn of the lateral meniscus extending to the inferior articular surface. LIGAMENTS Cruciates: Intact PCL.  Complete chronic ACL tear. Collaterals: Thickening of the proximal MCL  most consistent with proximal MCL strain without a complete tear. Lateral collateral ligament complex is intact. CARTILAGE Patellofemoral: Extensive full-thickness cartilage loss of the patellofemoral compartment. Medial: Extensive full-thickness cartilage loss of the medial femorotibial compartment. Lateral: High-grade partial-thickness cartilage loss with areas of full-thickness cartilage loss of the lateral femorotibial compartment. JOINT: Moderate joint effusion. Prominence of synovial fat in the suprapatellar joint space. Normal Hoffa's fat-pad. No plical thickening. POPLITEAL FOSSA: Mild tendinosis of the popliteus tendon. Small Baker's cyst. EXTENSOR MECHANISM: Intact quadriceps tendon. Intact patellar tendon. Intact lateral patellar retinaculum. Intact medial patellar retinaculum. Intact MPFL. BONES: No aggressive osseous  lesion. No fracture or dislocation. Tricompartmental marginal osteophytes. Other: No fluid collection or hematoma. Muscles are normal. Generalized IMPRESSION: 1. 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 2. Proximal MCL strain without a complete tear. 3. Maceration of the anterior horn-body junction of the medial meniscus. Large complex tear of the posterior horn of the medial meniscus with a radial component at the root of the posterior horn. 4. Maceration of the anterior horn of the lateral meniscus. Degeneration of the body and posterior horn of the lateral meniscus. Oblique tear of the posterior horn of the lateral meniscus extending to the inferior articular surface. 5. Complete chronic ACL tear. Electronically Signed   By: Kathreen Devoid M.D.   On: 11/28/2021 07:27   US Carotid Bilateral  Result Date: 11/27/2021 CLINICAL DATA:  History of hypertension and stroke. EXAM: BILATERAL CAROTID DUPLEX ULTRASOUND TECHNIQUE: Pearline Cables scale imaging, color Doppler and duplex ultrasound were performed of bilateral carotid and vertebral arteries in the neck. COMPARISON:  None. FINDINGS: Examination is degraded secondary to patient's inability to tolerate standard positioning. Criteria: Quantification of carotid stenosis is based on velocity parameters that correlate the residual internal carotid diameter with NASCET-based stenosis levels, using the diameter of the distal internal carotid lumen as the denominator for stenosis measurement. The following velocity measurements were obtained: RIGHT ICA: 36/9 cm/sec CCA: 33/2 cm/sec SYSTOLIC ICA/CCA RATIO:  0.8 ECA: 52 cm/sec LEFT ICA: 87/15 cm/sec CCA: 95/1 cm/sec SYSTOLIC ICA/CCA RATIO:  2.6 ECA: 41 cm/sec RIGHT CAROTID ARTERY: There is a minimal amount of intimal wall thickening involving the left internal carotid artery, not resulting in elevated peak systolic velocities within  the interrogated course of the left internal carotid artery to suggest a hemodynamically significant stenosis. RIGHT VERTEBRAL ARTERY:  Antegrade flow LEFT CAROTID ARTERY: There is a very minimal amount of wall thickening involving the left carotid bulb, origin and proximal aspect of the left internal carotid artery, not resulting in elevated peak systolic velocities within the interrogated course of the left internal carotid artery to suggest a hemodynamically significant stenosis. LEFT VERTEBRAL ARTERY:  Antegrade flow IMPRESSION: Very minimal moderate bilateral intimal wall thickening, not resulting in a hemodynamically significant stenosis within either internal carotid artery. Electronically Signed   By: Sandi Mariscal M.D.   On: 11/27/2021 15:22     Medications:    sodium chloride 50 mL/hr at 11/27/21 0934    (feeding supplement) PROSource Plus  30 mL Oral TID BM   allopurinol  50 mg Oral Daily   aspirin EC  81 mg Oral Daily   atorvastatin  40 mg Oral QPM   cloNIDine  0.1 mg Oral BID   colchicine  0.3 mg Oral Once   colchicine  0.3 mg Oral Daily   famotidine  10 mg Oral Daily   feeding supplement  1 Container Oral TID BM  insulin aspart  0-9 Units Subcutaneous TID AC & HS   isosorbide mononitrate  30 mg Oral Daily   lidocaine  10 mL Intradermal Once   metoprolol tartrate  50 mg Oral BID   multivitamin with minerals  1 tablet Oral Daily   acetaminophen **OR** acetaminophen, magnesium hydroxide, morphine injection, ondansetron **OR** ondansetron (ZOFRAN) IV, traZODone  Assessment/ Plan:  Mr. EDUARD PENKALA is a 81 y.o.  male with previous medical history of chronic diastolic CHF, hypertension, atrial fibrillation, and gout.  Patient has been admitted for Dehydration [E86.0] Elevated troponin [R77.8] Generalized weakness [R53.1] Acute renal failure, unspecified acute renal failure type ([place]) [N17.9] Acute kidney injury superimposed on chronic kidney disease ([place]) [N17.9,  N18.9] Leukocytosis, unspecified type [D72.829]   Acute Kidney Injury with proteinuria on chronic kidney disease stage IIIa with baseline creatinine 1.3 and GFR of 53 on 07/01/21.  Acute kidney injury secondary to dehydration and volume depletion Renal ultrasound shows no obstruction of bilateral renal cyst.  Furosemide currently held No acute indication of dialysis at this time.  Urinalysis on admission showed protein greater than 300.  Will ordered SPEP/UPEP and urine protein creatinine ratio for further evaluation  Renal recovery continues slowly.  Reported urine output meets goal.  Continue to encourage oral intake, including fluid intake.  May consider stopping IV fluids tomorrow.  Lab Results  Component Value Date   CREATININE 2.80 (H) 11/28/2021   CREATININE 3.14 (H) 11/27/2021   CREATININE 3.05 (H) 11/26/2021    Intake/Output Summary (Last 24 hours) at 11/28/2021 1101 Last data filed at 11/28/2021 1036 Gross per 24 hour  Intake 1415.72 ml  Output 1450 ml  Net -34.28 ml    2.  Hypertension with chronic kidney disease. Home regimen includes clonidine, furosemide, hydralazine, isosorbide, and metoprolol.  Currently receiving all medications except furosemide.  BP 128/94  3.  Chronic atrial fibrillation.  Remains on Eliquis.   4.  Chronic diastolic heart failure.  Echo on 11/22/2021 shows EF of 60 to 65% with moderate concentric left ventricular hypertrophy.  Left ventricular diastolic parameters indeterminate.    LOS: [place] 2/3/202311:01 AM

## 2021-12-01 LAB — PROTEIN ELECTROPHORESIS, SERUM
A/G Ratio: 0.7 (ref 0.7–1.7)
Albumin ELP: 2.5 g/dL — ABNORMAL LOW (ref 2.9–4.4)
Alpha-1-Globulin: 0.5 g/dL — ABNORMAL HIGH (ref 0.0–0.4)
Alpha-2-Globulin: 1 g/dL (ref 0.4–1.0)
Beta Globulin: 0.9 g/dL (ref 0.7–1.3)
Gamma Globulin: 1.2 g/dL (ref 0.4–1.8)
Globulin, Total: 3.6 g/dL (ref 2.2–3.9)
Total Protein ELP: 6.1 g/dL (ref 6.0–8.5)

## 2021-12-01 LAB — BODY FLUID CULTURE W GRAM STAIN
Culture: NO GROWTH
Culture: NO GROWTH

## 2021-12-10 ENCOUNTER — Emergency Department: Payer: Medicare Other

## 2021-12-10 ENCOUNTER — Other Ambulatory Visit: Payer: Self-pay

## 2021-12-10 ENCOUNTER — Inpatient Hospital Stay
Admission: EM | Admit: 2021-12-10 | Discharge: 2021-12-13 | DRG: 291 | Disposition: A | Discharge status: Home Health | Payer: Medicare Other | Source: Skilled Nursing Facility | Attending: Internal Medicine | Admitting: Internal Medicine

## 2021-12-10 DIAGNOSIS — M179 Osteoarthritis of knee, unspecified: Secondary | ICD-10-CM

## 2021-12-10 DIAGNOSIS — M7989 Other specified soft tissue disorders: Secondary | ICD-10-CM | POA: Diagnosis not present

## 2021-12-10 DIAGNOSIS — Z20822 Contact with and (suspected) exposure to covid-19: Secondary | ICD-10-CM | POA: Diagnosis present

## 2021-12-10 DIAGNOSIS — Z66 Do not resuscitate: Secondary | ICD-10-CM | POA: Diagnosis present

## 2021-12-10 DIAGNOSIS — I5033 Acute on chronic diastolic (congestive) heart failure: Secondary | ICD-10-CM | POA: Diagnosis present

## 2021-12-10 DIAGNOSIS — I482 Chronic atrial fibrillation, unspecified: Secondary | ICD-10-CM | POA: Diagnosis present

## 2021-12-10 DIAGNOSIS — M170 Bilateral primary osteoarthritis of knee: Secondary | ICD-10-CM | POA: Diagnosis present

## 2021-12-10 DIAGNOSIS — Z888 Allergy status to other drugs, medicaments and biological substances status: Secondary | ICD-10-CM

## 2021-12-10 DIAGNOSIS — Z8673 Personal history of transient ischemic attack (TIA), and cerebral infarction without residual deficits: Secondary | ICD-10-CM | POA: Diagnosis not present

## 2021-12-10 DIAGNOSIS — M109 Gout, unspecified: Secondary | ICD-10-CM | POA: Diagnosis present

## 2021-12-10 DIAGNOSIS — R778 Other specified abnormalities of plasma proteins: Secondary | ICD-10-CM | POA: Diagnosis present

## 2021-12-10 DIAGNOSIS — Z79899 Other long term (current) drug therapy: Secondary | ICD-10-CM

## 2021-12-10 DIAGNOSIS — I4821 Permanent atrial fibrillation: Secondary | ICD-10-CM | POA: Diagnosis not present

## 2021-12-10 DIAGNOSIS — I248 Other forms of acute ischemic heart disease: Secondary | ICD-10-CM | POA: Diagnosis present

## 2021-12-10 DIAGNOSIS — Z7901 Long term (current) use of anticoagulants: Secondary | ICD-10-CM | POA: Diagnosis not present

## 2021-12-10 DIAGNOSIS — I10 Essential (primary) hypertension: Secondary | ICD-10-CM

## 2021-12-10 DIAGNOSIS — E875 Hyperkalemia: Secondary | ICD-10-CM | POA: Diagnosis present

## 2021-12-10 DIAGNOSIS — R7989 Other specified abnormal findings of blood chemistry: Secondary | ICD-10-CM | POA: Diagnosis present

## 2021-12-10 DIAGNOSIS — I472 Ventricular tachycardia, unspecified: Secondary | ICD-10-CM | POA: Diagnosis present

## 2021-12-10 DIAGNOSIS — I4891 Unspecified atrial fibrillation: Secondary | ICD-10-CM | POA: Diagnosis present

## 2021-12-10 DIAGNOSIS — Z7189 Other specified counseling: Secondary | ICD-10-CM

## 2021-12-10 DIAGNOSIS — I509 Heart failure, unspecified: Secondary | ICD-10-CM

## 2021-12-10 DIAGNOSIS — I4729 Other ventricular tachycardia: Secondary | ICD-10-CM | POA: Diagnosis not present

## 2021-12-10 DIAGNOSIS — I130 Hypertensive heart and chronic kidney disease with heart failure and stage 1 through stage 4 chronic kidney disease, or unspecified chronic kidney disease: Secondary | ICD-10-CM | POA: Diagnosis not present

## 2021-12-10 DIAGNOSIS — I161 Hypertensive emergency: Secondary | ICD-10-CM | POA: Diagnosis present

## 2021-12-10 DIAGNOSIS — Z823 Family history of stroke: Secondary | ICD-10-CM | POA: Diagnosis not present

## 2021-12-10 DIAGNOSIS — N184 Chronic kidney disease, stage 4 (severe): Secondary | ICD-10-CM | POA: Diagnosis present

## 2021-12-10 DIAGNOSIS — E1121 Type 2 diabetes mellitus with diabetic nephropathy: Secondary | ICD-10-CM | POA: Insufficient documentation

## 2021-12-10 HISTORY — DX: Hypertensive emergency: I16.1

## 2021-12-10 LAB — BASIC METABOLIC PANEL
Anion gap: 7 (ref 5–15)
BUN: 58 mg/dL — ABNORMAL HIGH (ref 8–23)
CO2: 21 mmol/L — ABNORMAL LOW (ref 22–32)
Calcium: 9.4 mg/dL (ref 8.9–10.3)
Chloride: 108 mmol/L (ref 98–111)
Creatinine, Ser: 2.49 mg/dL — ABNORMAL HIGH (ref 0.61–1.24)
GFR, Estimated: 25 mL/min — ABNORMAL LOW (ref >60)
Glucose, Bld: 176 mg/dL — ABNORMAL HIGH (ref 70–99)
Potassium: 5.4 mmol/L — ABNORMAL HIGH (ref 3.5–5.1)
Sodium: 136 mmol/L (ref 135–145)

## 2021-12-10 LAB — URINALYSIS, ROUTINE W REFLEX MICROSCOPIC
Bacteria, UA: NONE SEEN
Bilirubin Urine: NEGATIVE
Glucose, UA: 50 mg/dL — AB
Ketones, ur: NEGATIVE mg/dL
Nitrite: NEGATIVE
Protein, ur: 100 mg/dL — AB
Specific Gravity, Urine: 1.01 (ref 1.005–1.030)
pH: 6 (ref 5.0–8.0)

## 2021-12-10 LAB — CBC
HCT: 45.9 % (ref 39.0–52.0)
Hemoglobin: 14.5 g/dL (ref 13.0–17.0)
MCH: 27 pg (ref 26.0–34.0)
MCHC: 31.6 g/dL (ref 30.0–36.0)
MCV: 85.3 fL (ref 80.0–100.0)
Platelets: 305 10*3/uL (ref 150–400)
RBC: 5.38 MIL/uL (ref 4.22–5.81)
RDW: 17.2 % — ABNORMAL HIGH (ref 11.5–15.5)
WBC: 10.7 10*3/uL — ABNORMAL HIGH (ref 4.0–10.5)
nRBC: 0 % (ref 0.0–0.2)

## 2021-12-10 LAB — TROPONIN I (HIGH SENSITIVITY)
Troponin I (High Sensitivity): 26 ng/L — ABNORMAL HIGH (ref <18)
Troponin I (High Sensitivity): 20 ng/L — ABNORMAL HIGH (ref <18)

## 2021-12-10 LAB — MAGNESIUM: Magnesium: 2.1 mg/dL (ref 1.7–2.4)

## 2021-12-10 LAB — BRAIN NATRIURETIC PEPTIDE: B Natriuretic Peptide: 1650.5 pg/mL — ABNORMAL HIGH (ref 0.0–100.0)

## 2021-12-10 IMAGING — DX DG CHEST 1V PORT
1 series · 1 of 1 positions shown · non-contrast
Comparison: [DATE]

CLINICAL DATA: Shortness of breath.

EXAM:
PORTABLE CHEST 1 VIEW

[chest ap]
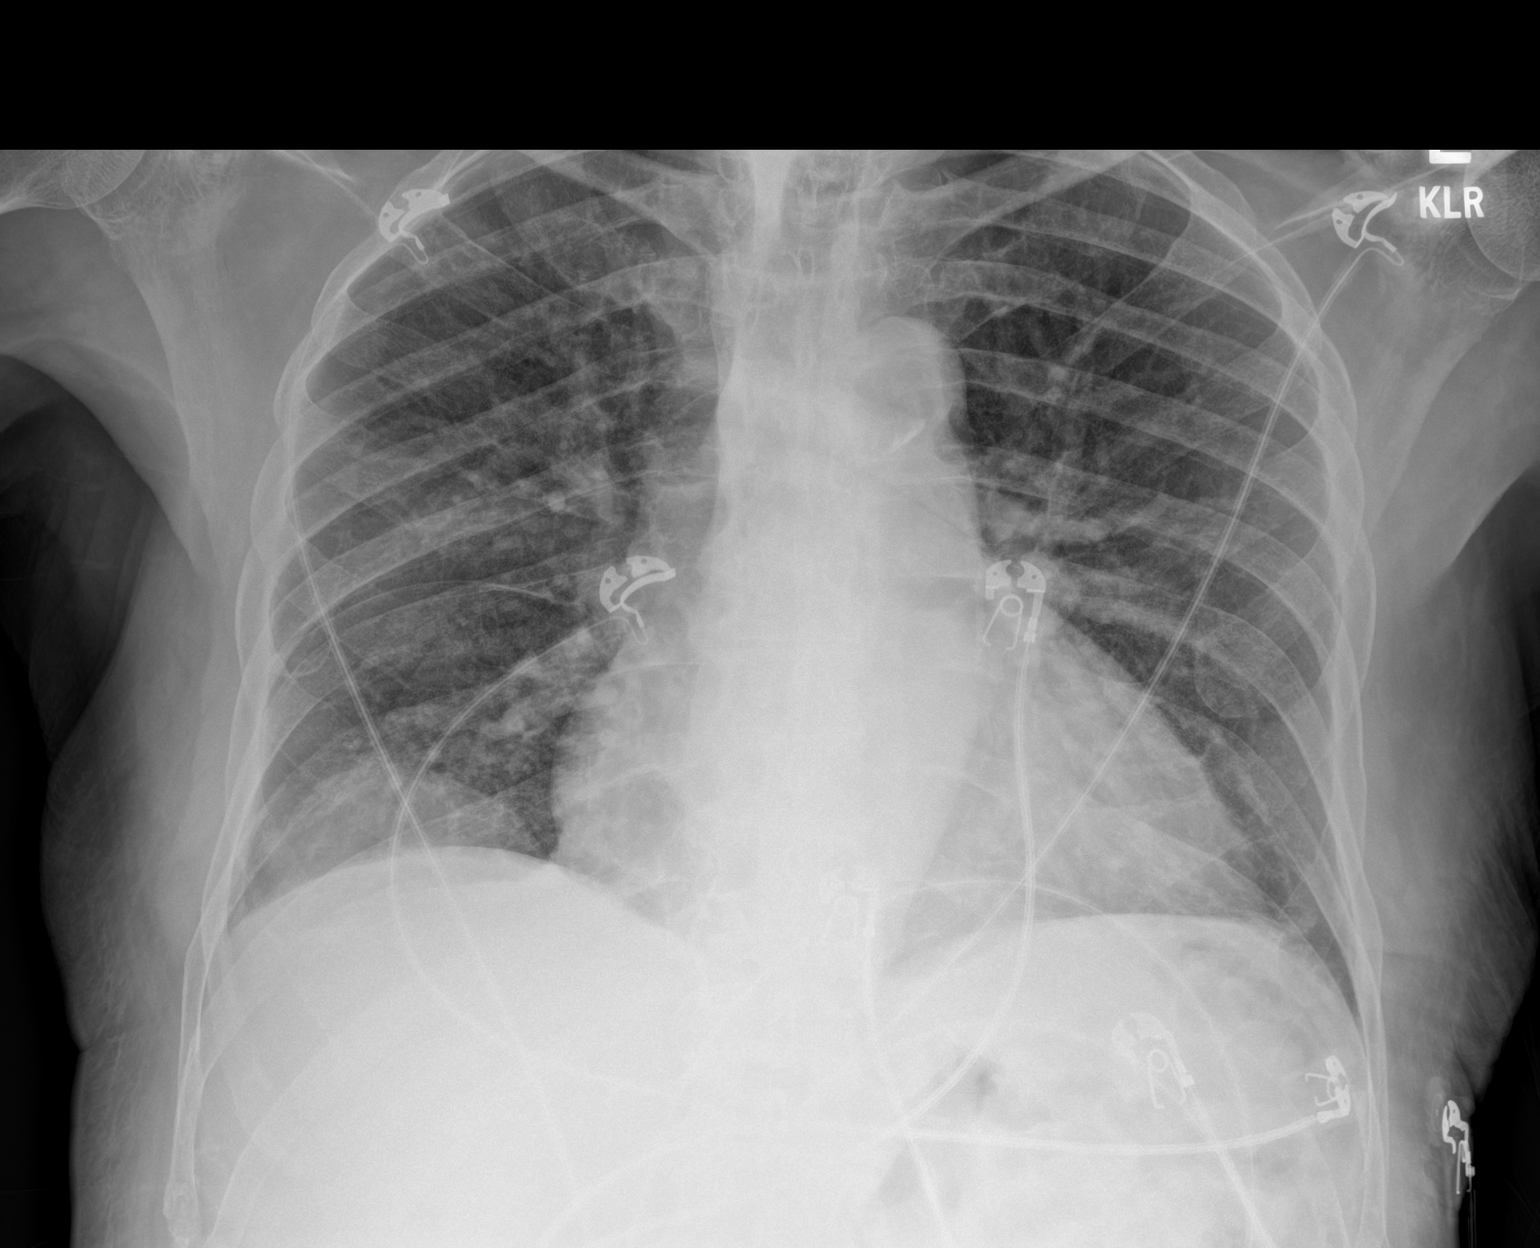

[1 of 1 positions shown; findings below may reference images not displayed]

FINDINGS: [YB] hours. The cardio pericardial silhouette is enlarged. There is
pulmonary vascular congestion without overt pulmonary edema. Diffuse
interstitial opacity at the bases suggest dependent edema. No
pleural effusion. Telemetry leads overlie the chest.
IMPRESSION: Enlarged cardiopericardial silhouette with pulmonary vascular
congestion and probable dependent edema.

## 2021-12-10 IMAGING — US US EXTREM LOW VENOUS*L*
1 series · 14 of 24 positions shown · non-contrast
Comparison: [DATE]

CLINICAL DATA: Left leg swelling

EXAM:
LEFT LOWER EXTREMITY VENOUS DOPPLER ULTRASOUND
TECHNIQUE: Gray-scale sonography with compression, as well as color and duplex
ultrasound, were performed to evaluate the deep venous system(s)
from the level of the common femoral vein through the popliteal and
proximal calf veins.

[Series 1: us venous img lower uni left (dvt) · portal-venous · 14 of 35 slices shown]
[im 1/35]
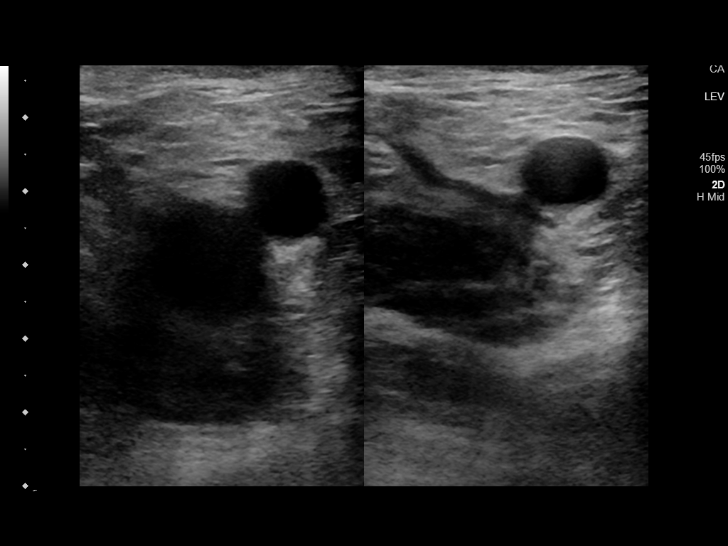
[im 3/35]
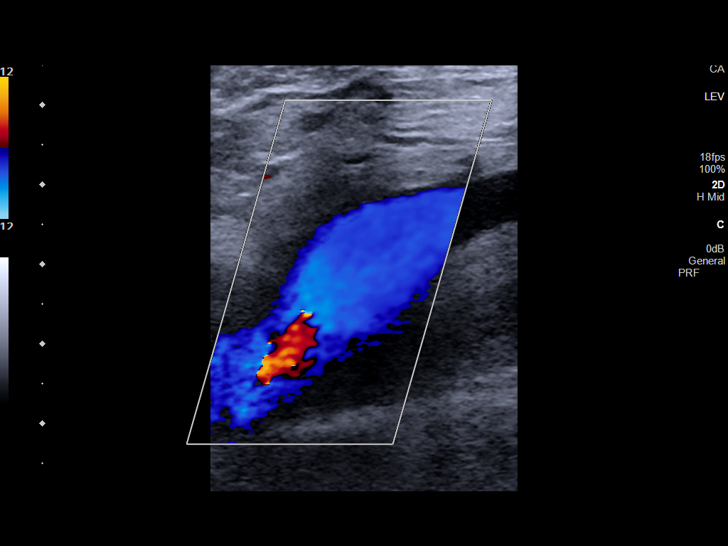
[im 6/35]
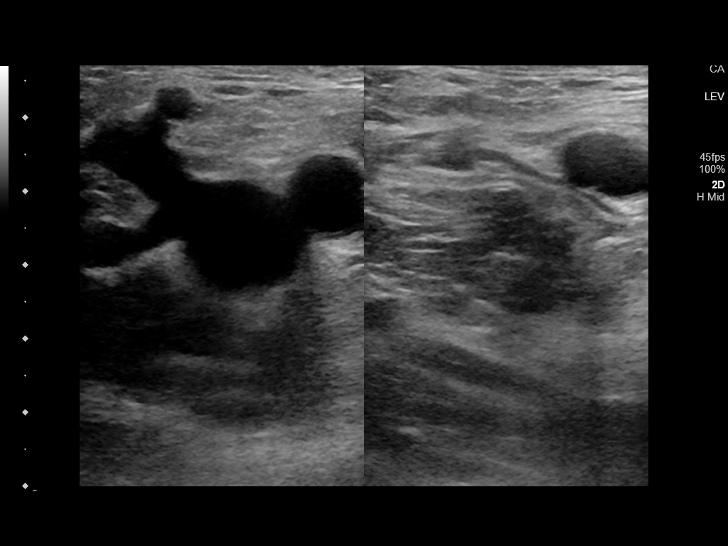
[im 9/35]
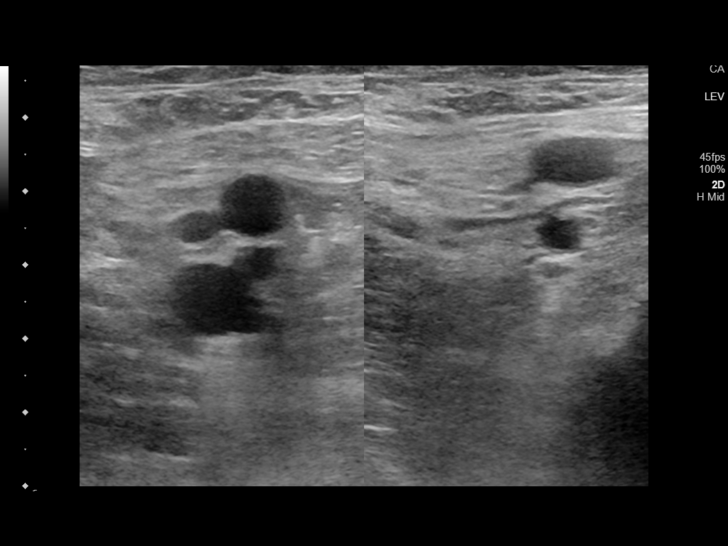
[im 11/35]
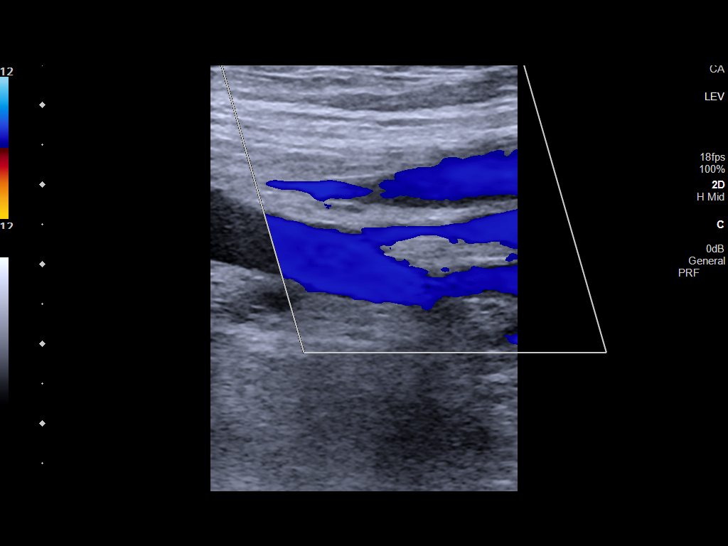
[im 14/35]
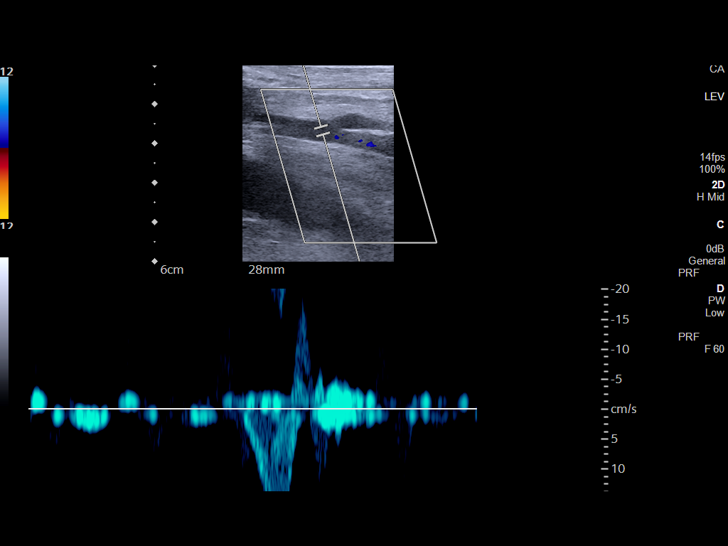
[im 17/35]
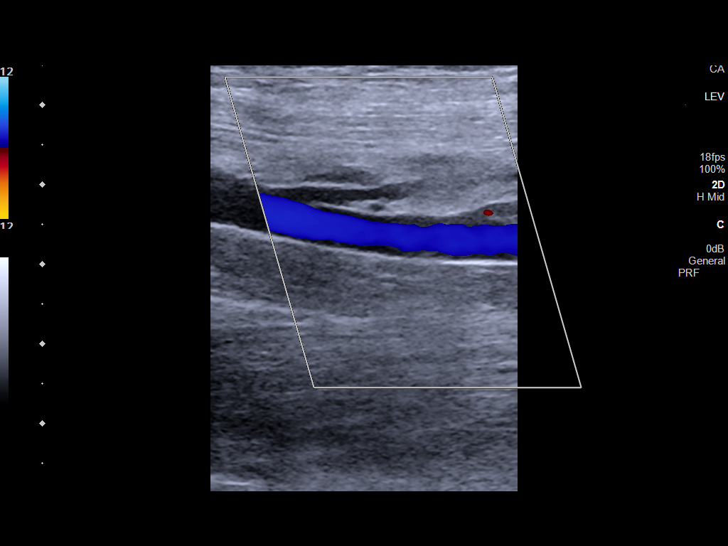
[im 18/35]
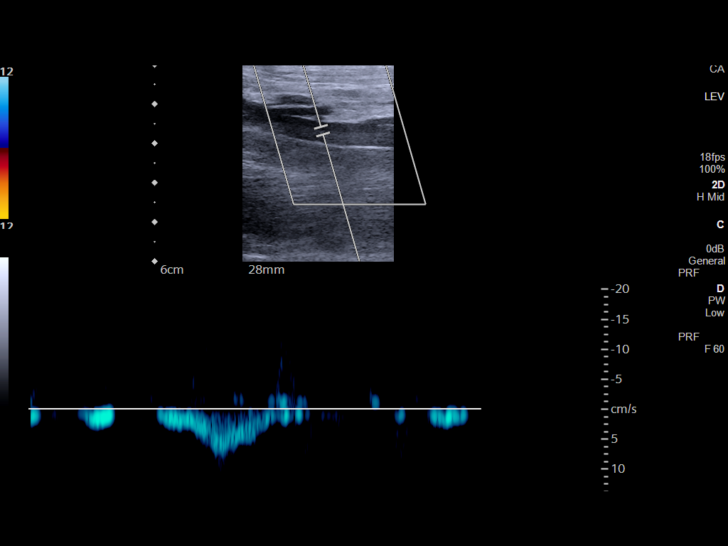
[im 21/35]
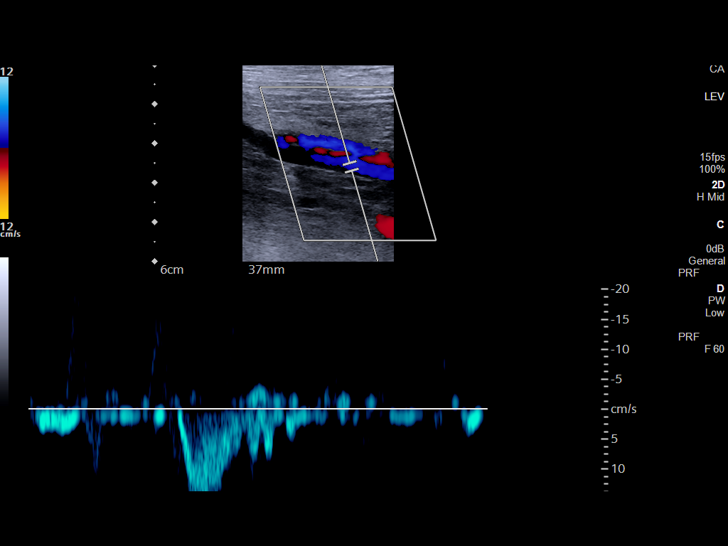
[im 24/35]
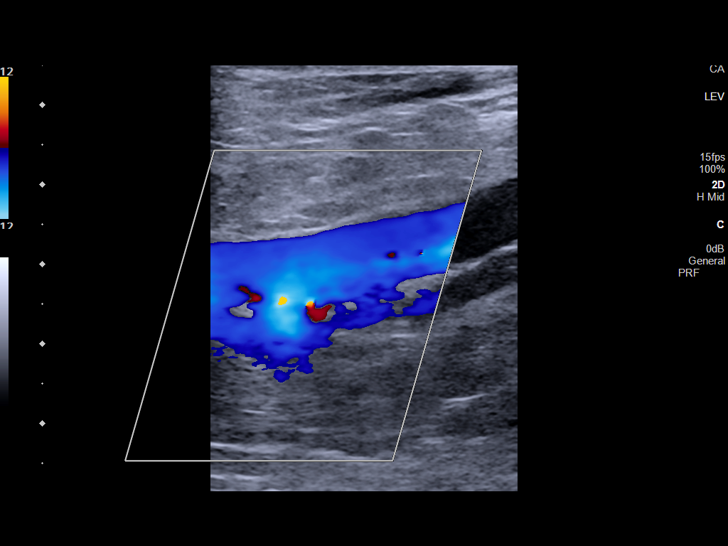
[im 27/35]
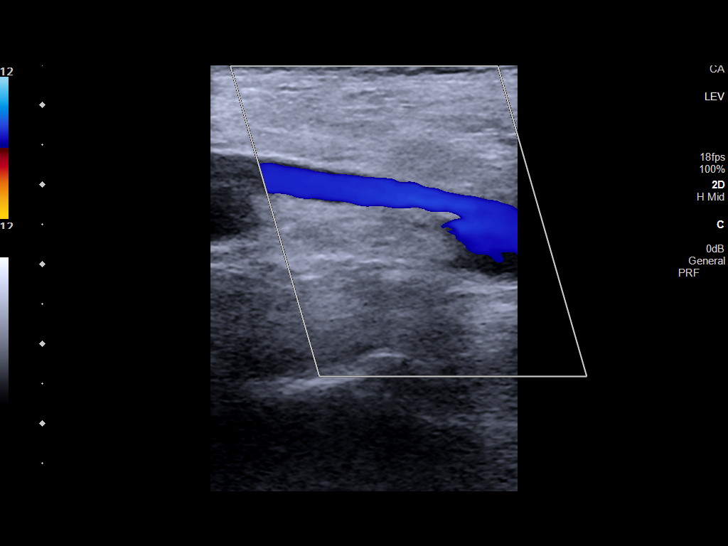
[im 29/35]
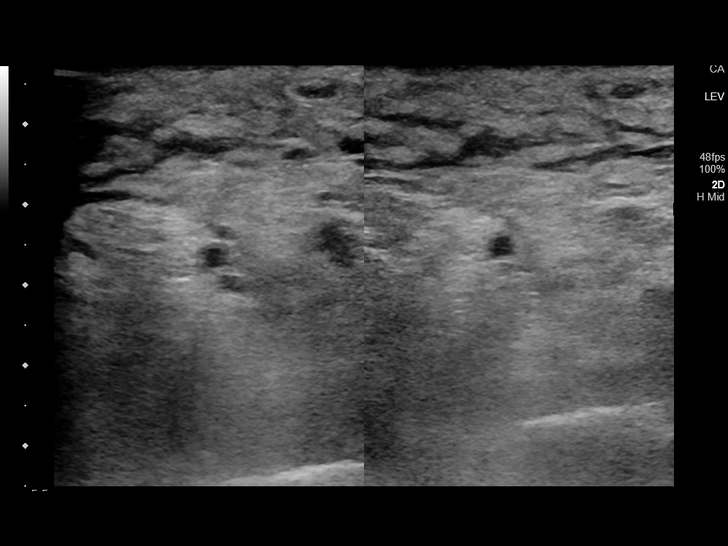
[im 32/35]
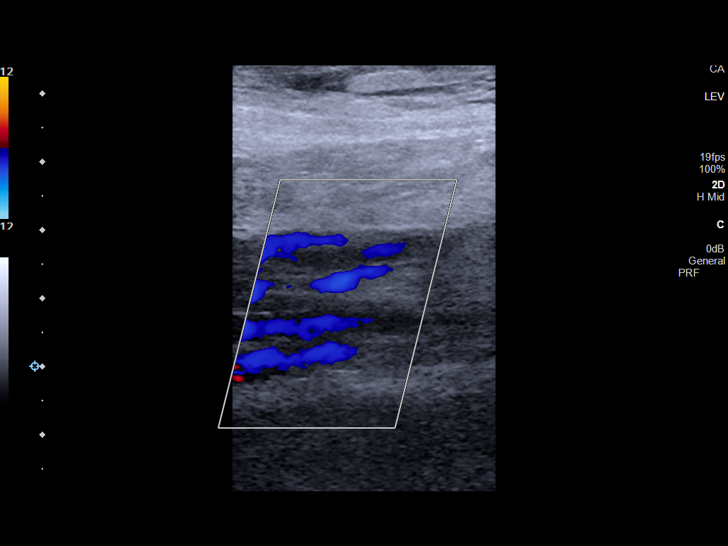
[im 35/35]
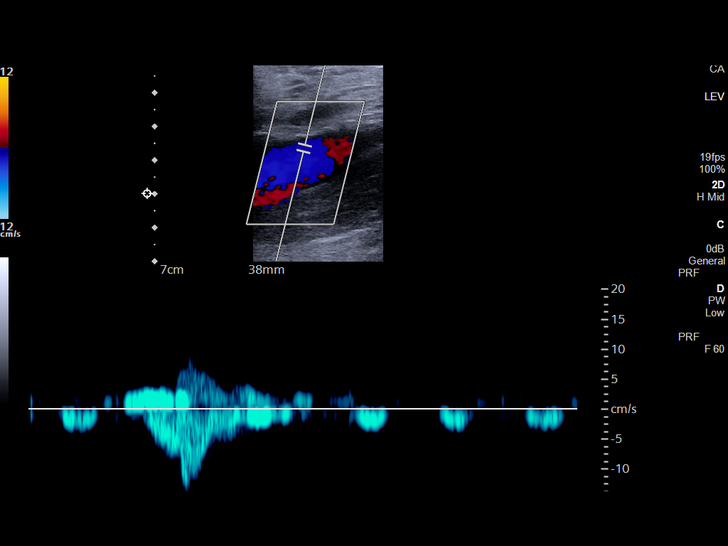

[14 of 24 positions shown; findings below may reference images not displayed]

FINDINGS: VENOUS

Normal compressibility of the common femoral, superficial femoral,
and popliteal veins, as well as the visualized calf veins.
Visualized portions of profunda femoral vein and great saphenous
vein unremarkable. No filling defects to suggest DVT on grayscale or
color Doppler imaging. Doppler waveforms show normal direction of
venous flow, normal respiratory plasticity and response to
augmentation.

Limited views of the contralateral common femoral vein are
unremarkable.

OTHER

Subcutaneous edema throughout the left calf.

Limitations: none
IMPRESSION: 1. No evidence of deep venous thrombosis within the left lower
extremity.
2. Subcutaneous edema throughout the left calf.

## 2021-12-10 IMAGING — CT CT HEAD W/O CM
4 series · 16 of 47 positions shown, 18 images · non-contrast
Comparison: CT brain [DATE]

CLINICAL DATA: Sudden severe headache. Hypertension. History of
stroke. Patient on blood thinners.



[Series 2: head wo · axial · 0.40mm/px · z∈[+287,+397]mm · 7 of 30 slices shown, 9 images]
[im 4/30  brain]
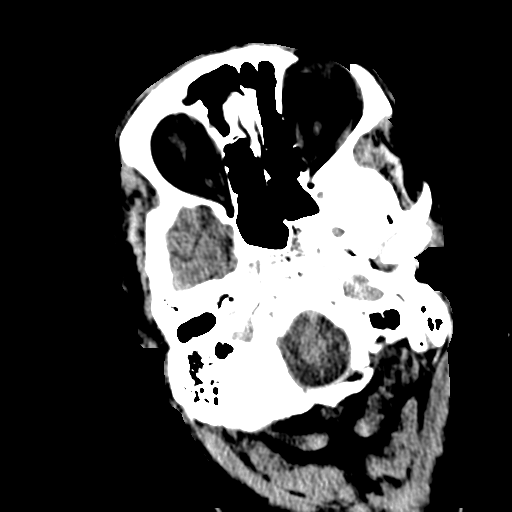
[im 4/30  bone]
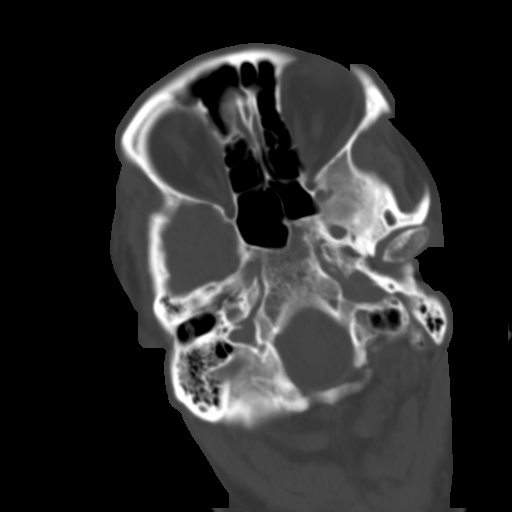
[im 8/30  brain]
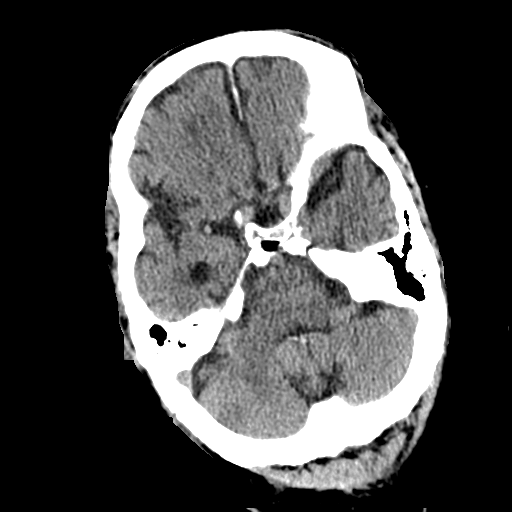
[im 11/30  brain]
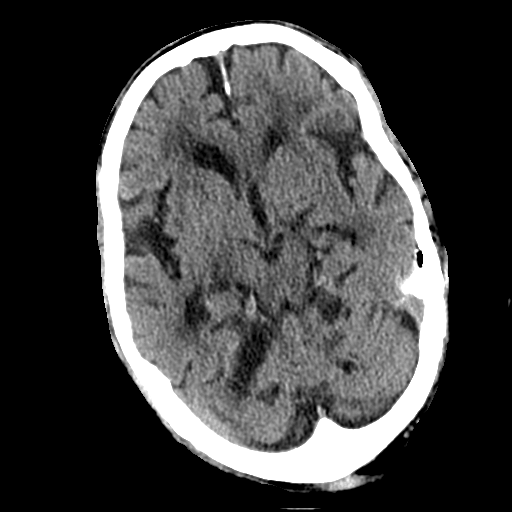
[im 15/30  brain]
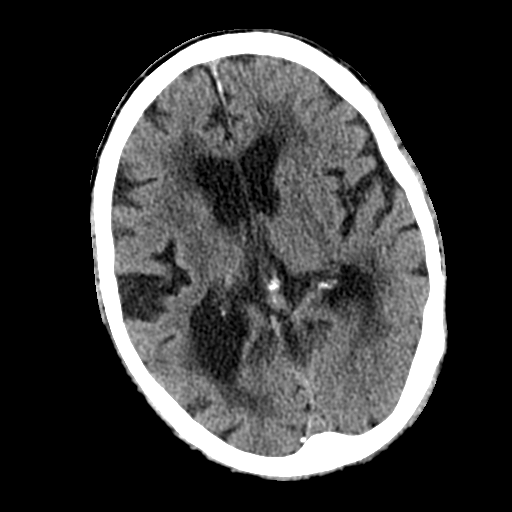
[im 19/30  brain]
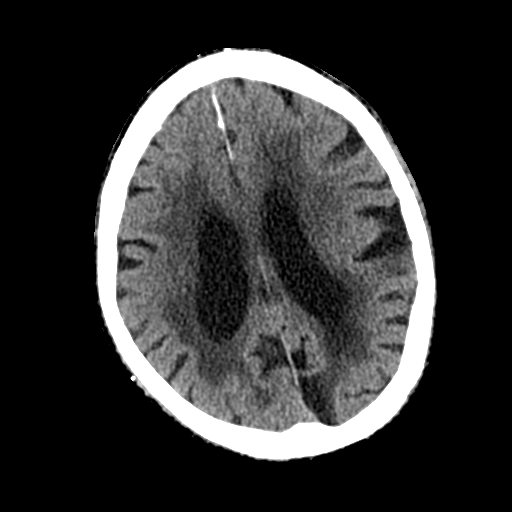
[im 19/30  bone]
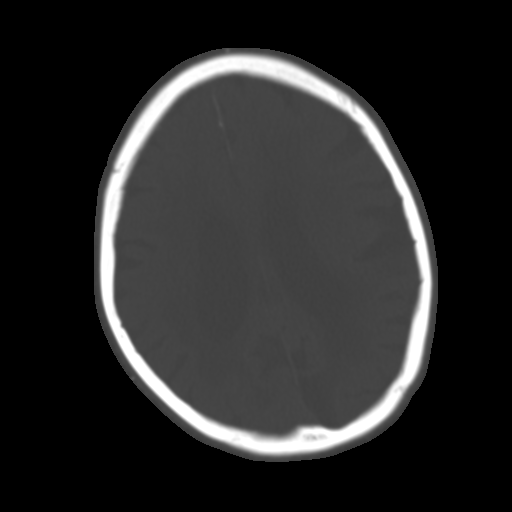
[im 22/30  brain]
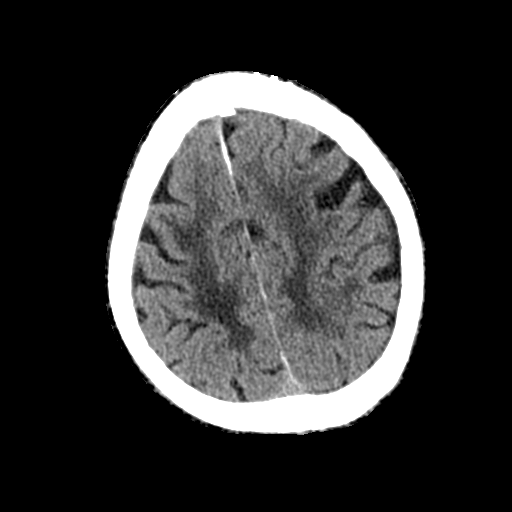
[im 26/30  brain]
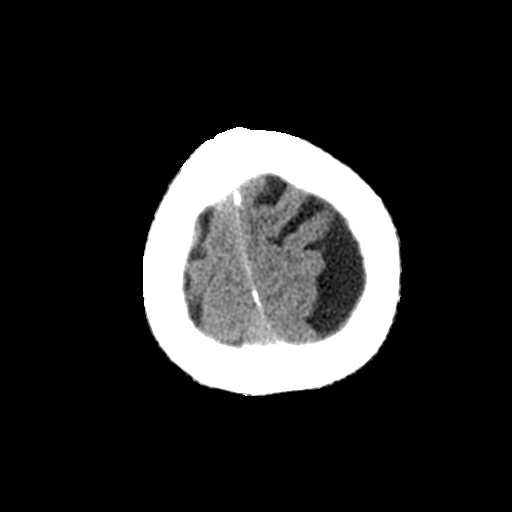

[Series 3: head bone · axial · 0.40mm/px · z∈[+286,+316]mm · 3 of 75 slices shown]
[im 8/75  bone]
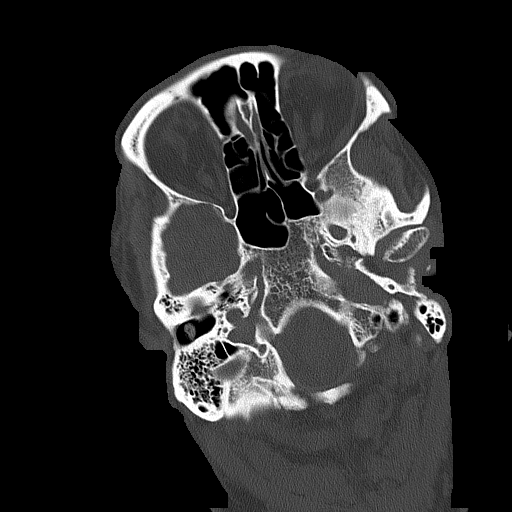
[im 15/75  bone]
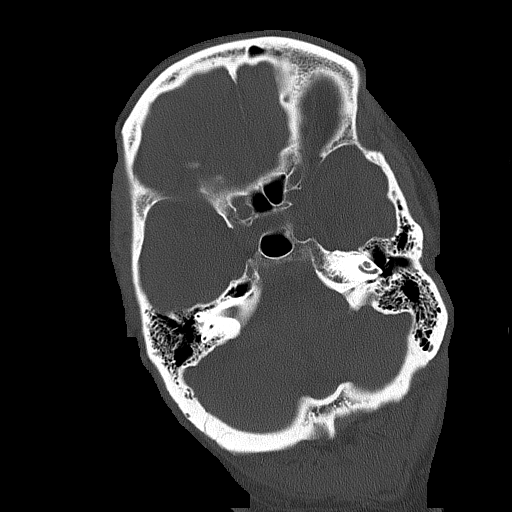
[im 23/75  bone]
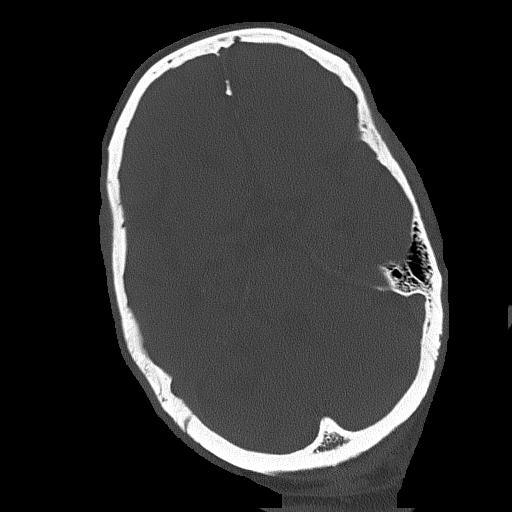

[Series 4: coronal soft tissue · coronal · 0.31mm/px · 3 of 73 slices shown]
[im 25/73  brain]
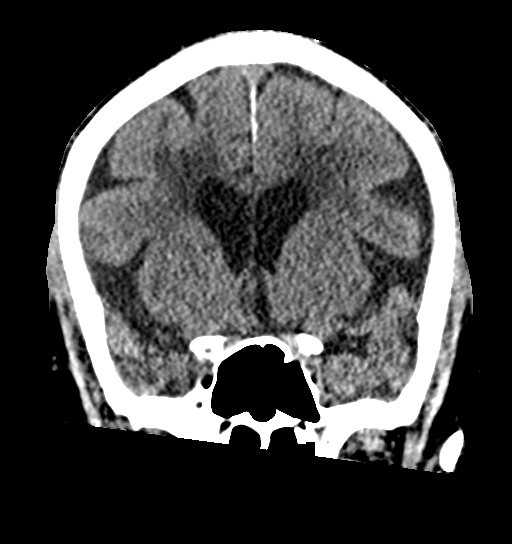
[im 33/73  brain]
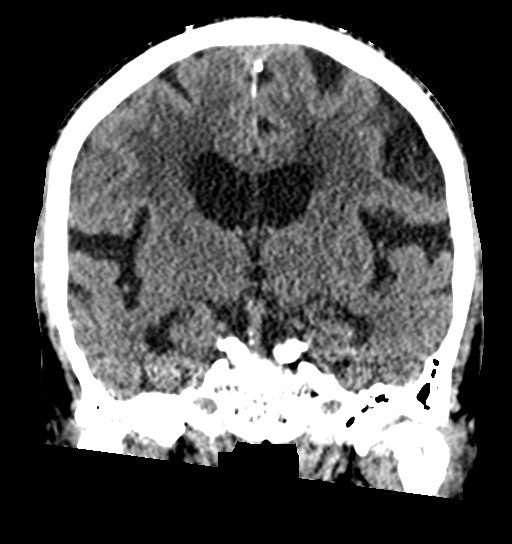
[im 41/73  brain]
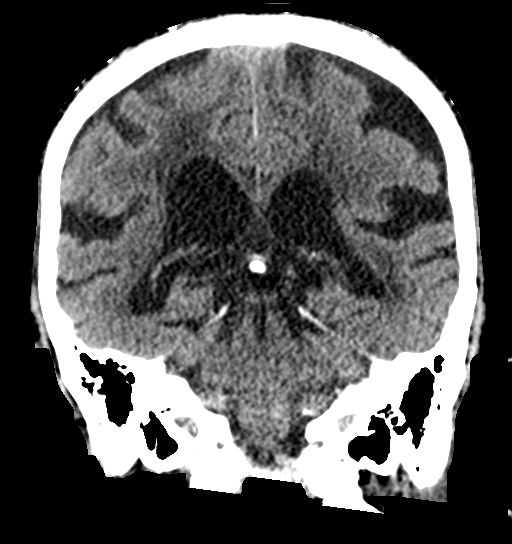

[Series 5: sagittal soft tissue · sagittal · 0.37mm/px · 3 of 52 slices shown]
[im 18/52  brain]
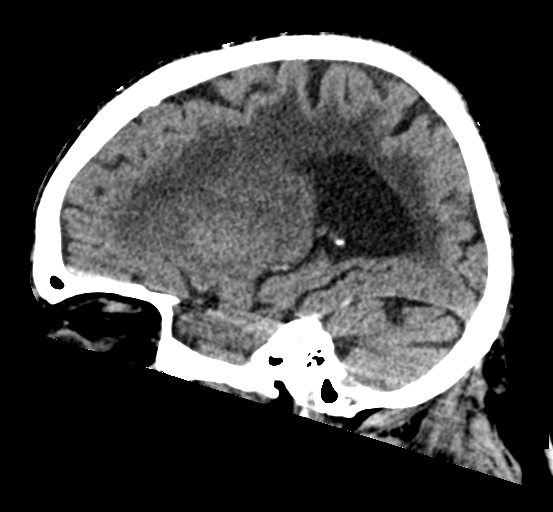
[im 26/52  brain]
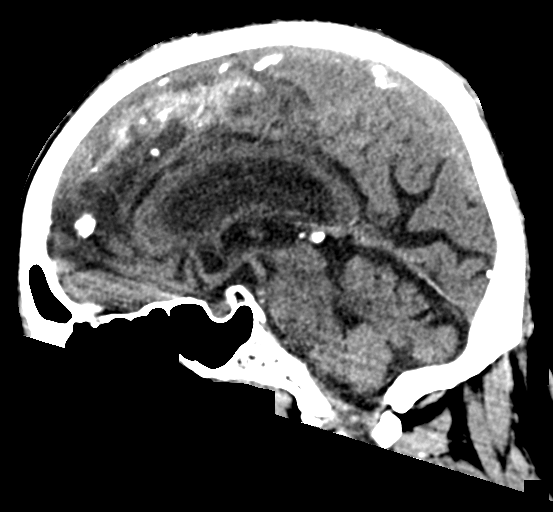
[im 35/52  brain]
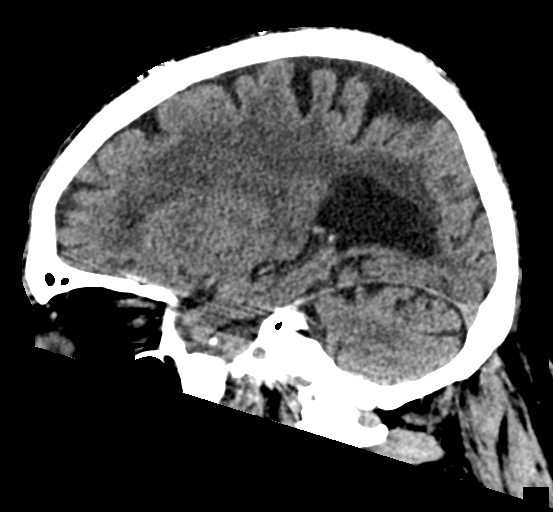

[16 of 47 positions shown; findings below may reference images not displayed]

FINDINGS: Brain: There is moderate cortical atrophy, unchanged from prior and
within normal limits for patient age. This is again greatest within
the left frontoparietal region. The ventricles are normal in
configuration. The basilar cisterns are patent.

No mass, mass effect, or midline shift.

No acute intracranial hemorrhage is seen.

No abnormal extra-axial fluid collection.

Moderate periventricular and subcortical white matter patchy
hypodensities, unchanged from prior and most likely secondary to
chronic ischemic white matter changes. There are atherosclerotic
intracranial calcifications.

Preservation of the normal cortical gray-white interface without CT
evidence of an acute major vascular territorial cortical based
infarction.

Vascular: No hyperdense vessel or unexpected calcification.

Skull: Normal. Negative for fracture or focal lesion.

Sinuses/Orbits: The visualized orbits are unremarkable. The
visualized paranasal sinuses and mastoid air cells are clear.

Other: None.
IMPRESSION: No significant change from prior.  No acute intracranial process.

Moderate atrophy and moderate chronic ischemic white matter changes.

## 2021-12-10 MED ORDER — FAMOTIDINE 20 MG PO TABS
10.0000 mg | ORAL_TABLET | Freq: Every day | ORAL | Status: DC
  Administered 2021-12-11 – 2021-12-13 (×3): 10 mg via ORAL
  Filled 2021-12-10 (×3): qty 1

## 2021-12-10 MED ORDER — APIXABAN 2.5 MG PO TABS
2.5000 mg | ORAL_TABLET | Freq: Two times a day (BID) | ORAL | Status: DC
  Administered 2021-12-10 – 2021-12-13 (×6): 2.5 mg via ORAL
  Filled 2021-12-10 (×8): qty 1

## 2021-12-10 MED ORDER — HYDRALAZINE HCL 50 MG PO TABS
25.0000 mg | ORAL_TABLET | Freq: Four times a day (QID) | ORAL | Status: DC
  Administered 2021-12-10: 25 mg via ORAL
  Filled 2021-12-10: qty 1

## 2021-12-10 MED ORDER — ACETAMINOPHEN 325 MG PO TABS
650.0000 mg | ORAL_TABLET | Freq: Four times a day (QID) | ORAL | Status: DC | PRN
  Administered 2021-12-11 – 2021-12-12 (×2): 650 mg via ORAL
  Filled 2021-12-10 (×2): qty 2

## 2021-12-10 MED ORDER — ACETAMINOPHEN 650 MG RE SUPP
650.0000 mg | Freq: Four times a day (QID) | RECTAL | Status: DC | PRN
Start: 2021-12-10 — End: 2021-12-13

## 2021-12-10 MED ORDER — ISOSORBIDE MONONITRATE ER 30 MG PO TB24
30.0000 mg | ORAL_TABLET | Freq: Every day | ORAL | Status: DC
  Administered 2021-12-11 – 2021-12-13 (×3): 30 mg via ORAL
  Filled 2021-12-10 (×3): qty 1

## 2021-12-10 MED ORDER — FUROSEMIDE 10 MG/ML IJ SOLN
20.0000 mg | Freq: Once | INTRAMUSCULAR | Status: AC
  Administered 2021-12-10: 20 mg via INTRAVENOUS
  Filled 2021-12-10: qty 4

## 2021-12-10 MED ORDER — ALLOPURINOL 100 MG PO TABS
50.0000 mg | ORAL_TABLET | Freq: Every day | ORAL | Status: DC
  Administered 2021-12-11 – 2021-12-13 (×3): 50 mg via ORAL
  Filled 2021-12-10 (×2): qty 1
  Filled 2021-12-10: qty 0.5
  Filled 2021-12-10 (×2): qty 1

## 2021-12-10 MED ORDER — ONDANSETRON HCL 4 MG PO TABS: 4.0000 mg | ORAL_TABLET | Freq: Four times a day (QID) | ORAL | Status: DC | PRN

## 2021-12-10 MED ORDER — COLCHICINE 0.6 MG PO TABS
0.3000 mg | ORAL_TABLET | Freq: Every day | ORAL | Status: DC
  Administered 2021-12-11 – 2021-12-13 (×3): 0.3 mg via ORAL
  Filled 2021-12-10 (×2): qty 1
  Filled 2021-12-10 (×2): qty 0.5
  Filled 2021-12-10: qty 1
  Filled 2021-12-10: qty 0.5

## 2021-12-10 MED ORDER — METOPROLOL TARTRATE 50 MG PO TABS
50.0000 mg | ORAL_TABLET | Freq: Two times a day (BID) | ORAL | Status: DC
  Administered 2021-12-10 – 2021-12-13 (×6): 50 mg via ORAL
  Filled 2021-12-10 (×6): qty 1

## 2021-12-10 MED ORDER — FUROSEMIDE 10 MG/ML IJ SOLN
40.0000 mg | Freq: Two times a day (BID) | INTRAMUSCULAR | Status: DC
  Administered 2021-12-10 – 2021-12-13 (×6): 40 mg via INTRAVENOUS
  Filled 2021-12-10 (×6): qty 4

## 2021-12-10 MED ORDER — ESCITALOPRAM OXALATE 10 MG PO TABS
10.0000 mg | ORAL_TABLET | Freq: Every day | ORAL | Status: DC
  Administered 2021-12-11 – 2021-12-13 (×3): 10 mg via ORAL
  Filled 2021-12-10 (×3): qty 1

## 2021-12-10 MED ORDER — ONDANSETRON HCL 4 MG/2ML IJ SOLN: 4.0000 mg | Freq: Four times a day (QID) | INTRAMUSCULAR | Status: DC | PRN

## 2021-12-10 MED ORDER — ROSUVASTATIN CALCIUM 10 MG PO TABS
20.0000 mg | ORAL_TABLET | Freq: Every day | ORAL | Status: DC
Start: 2021-12-11 — End: 2021-12-13
  Administered 2021-12-11 – 2021-12-13 (×3): 20 mg via ORAL
  Filled 2021-12-10: qty 1
  Filled 2021-12-10 (×4): qty 2

## 2021-12-10 NOTE — Assessment & Plan Note (Addendum)
CT head with no acute findings and patient has no acute neurologic deficits Continue Eliquis.  Not noted to be on statins Family leaning towards hospice

## 2021-12-10 NOTE — H&P (Signed)
History and Physical    Patient: George Bolton NIO:[id number] DOB: 09/14/1941 DOA: 12/10/2021 DOS: the patient was seen and examined on 12/10/2021 PCP: Denton Lank, MD  Patient coming from: SNF  Chief Complaint:  Chief Complaint  Patient presents with   Hypertension    HPI: George Bolton is a 81 y.o. male with medical history significant of Recent hospitalization from 1/28 to 2/3 with acute embolic stroke and community-acquired pneumonia, history of CKD 4, atrial fibrillation, hypertension, gout, knee pain with recent MRI that showed bilateral meniscal tear and severe tricompartmental OA and MCL strain seen by orthopedics seen by Ortho during recent hospitalization, not a candidate for surgery due to recent stroke, who was sent from rehab for elevated blood pressures.  Patient noted to be only on as needed hydralazine and colchicine.  He denies headache, blurred vision or one-sided numbness weakness or tingling and denies chest pain or shortness of breath.  Has lower extremity edema but no orthopnea.  Has no cough, fever or chills.  ED course: On arrival, BP 179/131 with pulse 101 and otherwise normal vitals Blood work: Troponin 26-20, BNP 1650 Creatinine 2.49, downtrending from 2.8 during recent hospitalization, with potassium 5.4 and bicarb 21 WBC 10,000.  Urinalysis with trace leukocytes  EKG, personally viewed and interpreted: A-fib at 96 with no acute ST-T wave changes  Imaging: CT head nonacute Chest x-ray pulmonary vascular congestion and probable dependent edema Left lower extremity venous Doppler negative for DVT, subcutaneous edema throughout the left calf  While in the ED patient had a few short runs of 45 beats of V. tach, asymptomatic  Patient treated with IV Lasix 20 mg.  Hospitalist consulted for admission.   Review of Systems: As mentioned in the history of present illness. All other systems reviewed and are negative. Past Medical History:  Diagnosis Date    Chronic atrial fibrillation ([place])    a. Afib dates back to at least 2002 when reviewing prior EKG with EKGs from 2014 onward showing Afib; b. CHADS2VASc at least 4 (CHF, HTN, age x 2)   Chronic diastolic CHF (congestive heart failure) ([place])    a. TTE 5/19: EF 70-75%, mod LVH, near complete obliteration of the mid and apical LV cavity (can be seen in apical hypertrophic CM), mildly dilated LA, RV cavity size nl, RV wall thickness nl, RVSF mildly reduced, mildly dilated RA; b. 02/2018 Limited echo w/ definity: EF 50-65%, no apical hypertrophic CM.   Gout    Hypertension    Past Surgical History:  Procedure Laterality Date   CHOLECYSTECTOMY N/A 01/13/2017   Procedure: LAPAROSCOPIC CHOLECYSTECTOMY;  Surgeon: Jules Husbands, MD;  Location: [facility] ORS;  Service: General;  Laterality: N/A;   Social History:  reports that he has never smoked. He has never used smokeless tobacco. He reports that he does not drink alcohol and does not use drugs.  Allergies  Allergen Reactions   Cortisone Anaphylaxis   Triamcinolone     Other reaction(s): Unknown    Family History  Problem Relation Age of Onset   Stroke Mother     Prior to Admission medications   Medication Sig Start Date End Date Taking? Authorizing Provider  acetaminophen (TYLENOL) 325 MG tablet Take 2 tablets (650 mg total) by mouth every 6 (six) hours as needed for mild pain (or Fever >/= 101). 11/28/21   Loletha Grayer, MD  allopurinol (ZYLOPRIM) 100 MG tablet Take 0.5 tablets (50 mg total) by mouth daily. 11/28/21   Loletha Grayer, MD  apixaban (ELIQUIS) 2.5 MG TABS tablet Take 2.5 mg by mouth 2 (two) times daily. 12/27/19   [provider]  cloNIDine (CATAPRES) 0.1 MG tablet Take 0.1 mg by mouth 2 (two) times daily. 11/07/21   [provider]  colchicine 0.6 MG tablet Take 0.5 tablets (0.3 mg total) by mouth daily. 11/29/21   Loletha Grayer, MD  famotidine (PEPCID) 10 MG tablet Take 1 tablet (10 mg total) by mouth daily. 11/29/21    Loletha Grayer, MD  isosorbide mononitrate (IMDUR) 30 MG 24 hr tablet Take 30 mg by mouth daily. 01/31/21   [provider]  metoprolol tartrate (LOPRESSOR) 50 MG tablet Take 1 tablet (50 mg total) by mouth 2 (two) times daily. 11/28/21   Loletha Grayer, MD  Multiple Vitamin (MULTIVITAMIN WITH MINERALS) TABS tablet Take 1 tablet by mouth daily. 11/29/21   Loletha Grayer, MD  Nutritional Supplements (,FEEDING SUPPLEMENT, PROSOURCE PLUS) liquid Take 30 mLs by mouth 3 (three) times daily between meals. 11/28/21   Loletha Grayer, MD  polyethylene glycol (MIRALAX / GLYCOLAX) 17 g packet Take 17 g by mouth daily as needed for severe constipation. 11/28/21   Loletha Grayer, MD  rosuvastatin (CRESTOR) 20 MG tablet Take 1 tablet (20 mg total) by mouth daily. 11/28/21 12/28/21  Loletha Grayer, MD    Physical Exam: Vitals:   12/10/21 1900 12/10/21 1930 12/10/21 2000 12/10/21 2100  BP: (!) 159/133 (!) 170/122 (!) 164/130 (!) 169/119  Pulse:   88 (!) 102  Resp: (!) 24 15 (!) 23 (!) 25  Temp:      SpO2:   99% 97%   Physical Exam Vitals and nursing note reviewed.  Constitutional:      General: He is not in acute distress.    Appearance: Normal appearance.  HENT:     Head: Normocephalic and atraumatic.  Cardiovascular:     Rate and Rhythm: Normal rate and regular rhythm.     Pulses: Normal pulses.     Heart sounds: Normal heart sounds. No murmur heard. Pulmonary:     Effort: Pulmonary effort is normal. Tachypnea present.     Breath sounds: Normal breath sounds. Decreased air movement present. No wheezing or rhonchi.  Abdominal:     General: Bowel sounds are normal.     Palpations: Abdomen is soft.     Tenderness: There is no abdominal tenderness.  Musculoskeletal:        General: No swelling or tenderness. Normal range of motion.     Cervical back: Normal range of motion and neck supple.     Right lower leg: Edema present.     Left lower leg: Edema present.  Skin:    General: Skin  is warm and dry.  Neurological:     General: No focal deficit present.     Mental Status: He is alert. Mental status is at baseline.  Psychiatric:        Mood and Affect: Mood normal.        Behavior: Behavior normal.     Data Reviewed: Notes from primary care and specialist visits, past discharge summaries. Prior diagnostic testing as applicable to current admission diagnoses Updated medications and problem lists for reconciliation ED course, including vitals, labs, imaging, treatment and response to treatment Triage notes and ED providers notes   Assessment and Plan: * Acute on chronic diastolic CHF (congestive heart failure) ([place]) Probably triggered by uncontrolled hypertension Continue IV Lasix Continue metoprolol, isosorbide Daily weights with intake and output monitoring EF on  1/28 was 60 to 65% with indeterminate diastolic parameters  Hypertensive emergency BP 179/131 on arrival, improving to 169/119 by admission Continue home metoprolol 50 mg twice daily Continue Imdur Start hydralazine 25 mg 3 times daily with monitoring for hypotension  NSVT (nonsustained ventricular tachycardia) Patient reportedly had a few short runs of V. tach 45 beats while in the ED Check mag and replete if below 2 Keep potassium over 4 Continuous cardiac monitoring  Recent cerebrovascular accident 11/22/21 ([facility]) CT head with no acute findings and patient has no acute neurologic deficits Continue Eliquis.  Not noted to be on statins Continue PT while in-house  Elevated troponin Troponin 26-20, and continues to downtrend from recent past hospitalization when it peaked at 97 Patient denies chest pain and EKG is nonacute  Hyperkalemia Potassium of 5.4 without peaked T waves on EKG Expecting improvement with IV Lasix Continue to monitor  Atrial fibrillation ([place])- (present on admission) Rate controlled Continue metoprolol Continue Eliquis  Osteoarthritis of knee MRI  On 2/3 showed  bilateral meniscal tear and severe tricompartmental OA and MCL strain  Seen by Ortho on 2/3, not a candidate for surgery due to recent stroke,  CKD (chronic kidney disease) stage 4, GFR 15-29 ml/min (HCC) Renal function at baseline and improved from recent hospitalization       Advance Care Planning:   Code Status: Prior   Consults: none  Family Communication: none  Severity of Illness: The appropriate patient status for this patient is INPATIENT. Inpatient status is judged to be reasonable and necessary in order to provide the required intensity of service to ensure the patient's safety. The patient's presenting symptoms, physical exam findings, and initial radiographic and laboratory data in the context of their chronic comorbidities is felt to place them at high risk for further clinical deterioration. Furthermore, it is not anticipated that the patient will be medically stable for discharge from the hospital within 2 midnights of admission.   * I certify that at the point of admission it is my clinical judgment that the patient will require inpatient hospital care spanning beyond 2 midnights from the point of admission due to high intensity of service, high risk for further deterioration and high frequency of surveillance required.*  Author: Athena Masse, MD 12/10/2021 9:08 PM  For on call review [email or web address].

## 2021-12-10 NOTE — ED Notes (Signed)
Eliquis ordered from pharmacy

## 2021-12-10 NOTE — ED Notes (Signed)
Purewick canister emptied

## 2021-12-10 NOTE — Assessment & Plan Note (Addendum)
BP 179/131 on arrival, improving to 169/119 by admission Continue home metoprolol 50 mg twice daily Continue Imdur and  hydralazine 25 mg 3 times daily with monitoring for hypotension

## 2021-12-10 NOTE — Assessment & Plan Note (Addendum)
Renal function at baseline and improved from recent hospitalization.

## 2021-12-10 NOTE — Assessment & Plan Note (Addendum)
Rate controlled Continue metoprolol Continue Eliquis.

## 2021-12-10 NOTE — Assessment & Plan Note (Addendum)
Potassium of 5.4 without peaked T waves on EKG Now resolved

## 2021-12-10 NOTE — Assessment & Plan Note (Addendum)
Probably triggered by uncontrolled hypertension Continue IV Lasix Continue metoprolol, isosorbide Daily weights with intake and output monitoring EF on 1/28 was 60 to 65% with indeterminate diastolic parameters Family requesting hospice

## 2021-12-10 NOTE — Assessment & Plan Note (Addendum)
Patient reportedly had a few short runs of V. tach 45 beats while in the ED

## 2021-12-10 NOTE — ED Provider Notes (Addendum)
[facility] Provider Note    Event Date/Time   First MD Initiated Contact with Patient 12/10/21 1609     (approximate)   History   Hypertension   HPI  George Bolton is a 81 y.o. male with CKD, heart failure, A-fib on Eliquis who comes in with concerns for elevated blood pressure.  Patient comes in from [employer] due to concerns for hypotension.  Patient denies any pain or any other complaints.  Patient denies any chest pain, shortness of breath, abdominal pain.  He does have some leg swelling.  Unclear how long its been going on for.  It sounds like family was concerned about his elevated blood pressure so wanted him To come to the ER to be evaluated  On review of records patient had a hospital admission from 1/2 8 until 2/3.  During the stay patient had MRI showing acute infarct.  Patient also had gout of the knee with known bilateral meniscus tears, as well as pneumonia treated with Rocephin and azithromycin.  I reviewed patient's blood pressure medicine and he is on hydralazine PRN 25mg  given Sunday 12th  Furosemide PRN 20mg  given weds 15th Imdur 30  Clonidine 0.1 BID       Physical Exam   Triage Vital Signs: ED Triage Vitals  Enc Vitals Group     BP 12/10/21 1603 (!) 179/131     Pulse Rate 12/10/21 1603 (!) 101     Resp 12/10/21 1603 19     Temp 12/10/21 1603 98 F (36.7 C)     Temp src --      SpO2 12/10/21 1603 100 %     Weight --      Height --      Head Circumference --      Peak Flow --      Pain Score 12/10/21 1600 0     Pain Loc --      Pain Edu? --      Excl. in [place]? --     Most recent vital signs: Vitals:   12/10/21 1603  BP: (!) 179/131  Pulse: (!) 101  Resp: 19  Temp: 98 F (36.7 C)  SpO2: 100%     General: Awake, no distress.  Pleasant elderly male CV:  Good peripheral perfusion.  Resp:  Normal effort.  Abd:  No distention.  Soft and nontender Other:  Equal strength in arms and legs.  No weakness noted.   Cranial nerves appear intact Swelling noted in his bilateral legs   ED Results / Procedures / Treatments   Labs (all labs ordered are listed, but only abnormal results are displayed) Labs Reviewed  BASIC METABOLIC PANEL - Abnormal; Notable for the following components:      Result Value   Potassium 5.4 (*)    CO2 21 (*)    Glucose, Bld 176 (*)    BUN 58 (*)    Creatinine, Ser 2.49 (*)    GFR, Estimated 25 (*)    All other components within normal limits  CBC - Abnormal; Notable for the following components:   WBC 10.7 (*)    RDW 17.2 (*)    All other components within normal limits  URINALYSIS, ROUTINE W REFLEX MICROSCOPIC  BRAIN NATRIURETIC PEPTIDE  CBG MONITORING, ED  TROPONIN I (HIGH SENSITIVITY)     EKG  My interpretation of EKG:  A-fib rate of 96, no ST elevation, T wave version aVL, normal intervals  RADIOLOGY I have  reviewed the xray personally and it appears that patient has cardiomegaly with pulmonary edema  IMPRESSION: Enlarged cardiopericardial silhouette with pulmonary vascular congestion and probable dependent edema.    PROCEDURES:  Critical Care performed: No  .1-3 Lead EKG Interpretation Performed by: Vanessa [place], MD Authorized by: Vanessa [place], MD     Interpretation: normal     ECG rate:  100   ECG rate assessment: tachycardic     Rhythm: atrial fibrillation     Ectopy: PVCs     Conduction: normal   Comments:     Occasional PVCs and 3-4 beats of nonsustained V. tach   MEDICATIONS ORDERED IN ED: Medications  furosemide (LASIX) injection 20 mg (20 mg Intravenous Given 12/10/21 1641)     IMPRESSION / MDM / [place] / ED COURSE  I reviewed the triage vital signs and the nursing notes.                              Patient with CHF who comes in with concerns for elevated blood pressure.  On my examination he looks like he could have some fluid overload with some edema noted in his bilateral legs.  I reviewed his records  and it appears that he is only getting Lasix as needed and hydralazine as needed but has only gotten 1 dose the past few days.  Given my concerns for fluid overload I have given 1 dose of IV Lasix.  I will get a CT head just to make sure there is no evidence of intracranial hemorrhage given his recent stroke although he appears neuro intact at this time.  Kidney function was 2.49 downtrending from 12 days ago.  Potassium is slightly elevated at 5.4 but patient getting some IV Lasix  CBC shows slightly elevated white count but downtrending from prior  Troponin was 26 with downtrend to 20 mostly demand and is lower than his prior troponin  UA without evidence of any UTI  BNP is elevated  Patient had good urine output with the IV Lasix.  I suspect his elevated blood pressure is due to extra fluid on him.  Patient's been having a few runs of nonsustained V. tach for 4-5 beats.  Given this we will discuss with hospital team for admission given my concern for fluid overload   The patient is on the cardiac monitor to evaluate for evidence of arrhythmia and/or significant heart rate changes.  FINAL CLINICAL IMPRESSION(S) / ED DIAGNOSES   Final diagnoses:  Congestive heart failure, unspecified HF chronicity, unspecified heart failure type ([place])  Hypertension, unspecified type  NSVT (nonsustained ventricular tachycardia)     Rx / DC Orders   ED Discharge Orders     None        Note:  This document was prepared using Dragon voice recognition software and may include unintentional dictation errors.   Vanessa [place], MD 12/10/21 2032    Vanessa [place], MD 12/10/21 2102

## 2021-12-10 NOTE — Assessment & Plan Note (Addendum)
Troponin 26-20, and continues to downtrend from recent past hospitalization when it peaked at 88 Patient denies chest pain and EKG is nonacute.

## 2021-12-10 NOTE — Assessment & Plan Note (Addendum)
MRI  On 2/3 showed bilateral meniscal tear and severe tricompartmental OA and MCL strain  Seen by Ortho on 2/3, not a candidate for surgery due to recent stroke

## 2021-12-10 NOTE — ED Triage Notes (Addendum)
Pt comes with c/o HTN. Pt arrives from [facility]. Pt denies any pain or other complaints.  Facility wanted him evaluated bc of hx of stroke in past. Pt is on thinners.  Pt is A*OX4.  BP-171/143 HR-86 96% RA

## 2021-12-11 ENCOUNTER — Encounter: Payer: Self-pay | Admitting: Internal Medicine

## 2021-12-11 ENCOUNTER — Ambulatory Visit: Payer: Medicare Other | Admitting: Family

## 2021-12-11 DIAGNOSIS — I4821 Permanent atrial fibrillation: Secondary | ICD-10-CM

## 2021-12-11 DIAGNOSIS — Z7189 Other specified counseling: Secondary | ICD-10-CM

## 2021-12-11 DIAGNOSIS — N184 Chronic kidney disease, stage 4 (severe): Secondary | ICD-10-CM

## 2021-12-11 HISTORY — DX: Other specified counseling: Z71.89

## 2021-12-11 LAB — CBC
HCT: 42.5 % (ref 39.0–52.0)
Hemoglobin: 13.5 g/dL (ref 13.0–17.0)
MCH: 26.6 pg (ref 26.0–34.0)
MCHC: 31.8 g/dL (ref 30.0–36.0)
MCV: 83.7 fL (ref 80.0–100.0)
Platelets: 250 10*3/uL (ref 150–400)
RBC: 5.08 MIL/uL (ref 4.22–5.81)
RDW: 16.9 % — ABNORMAL HIGH (ref 11.5–15.5)
WBC: 11.8 10*3/uL — ABNORMAL HIGH (ref 4.0–10.5)
nRBC: 0 % (ref 0.0–0.2)

## 2021-12-11 LAB — RESP PANEL BY RT-PCR (FLU A&B, COVID) ARPGX2
Influenza A by PCR: NEGATIVE
Influenza B by PCR: NEGATIVE
SARS Coronavirus 2 by RT PCR: NEGATIVE

## 2021-12-11 LAB — BASIC METABOLIC PANEL
Anion gap: 8 (ref 5–15)
BUN: 54 mg/dL — ABNORMAL HIGH (ref 8–23)
CO2: 21 mmol/L — ABNORMAL LOW (ref 22–32)
Calcium: 9.3 mg/dL (ref 8.9–10.3)
Chloride: 107 mmol/L (ref 98–111)
Creatinine, Ser: 2.51 mg/dL — ABNORMAL HIGH (ref 0.61–1.24)
GFR, Estimated: 25 mL/min — ABNORMAL LOW (ref >60)
Glucose, Bld: 108 mg/dL — ABNORMAL HIGH (ref 70–99)
Potassium: 4.1 mmol/L (ref 3.5–5.1)
Sodium: 136 mmol/L (ref 135–145)

## 2021-12-11 MED ORDER — HYDRALAZINE HCL 25 MG PO TABS
25.0000 mg | ORAL_TABLET | Freq: Four times a day (QID) | ORAL | Status: DC
  Administered 2021-12-11 – 2021-12-12 (×6): 25 mg via ORAL
  Filled 2021-12-11 (×6): qty 1

## 2021-12-11 NOTE — Hospital Course (Addendum)
81 y.o. male with medical history significant of Recent hospitalization from 1/28 to 2/3 with acute embolic stroke and community-acquired pneumonia, history of CKD 4, atrial fibrillation, hypertension, gout, knee pain with recent MRI that showed bilateral meniscal tear and severe tricompartmental OA and MCL strain seen by orthopedics seen by Ortho during recent hospitalization, not a candidate for surgery due to recent stroke, who was sent from rehab for elevated blood pressures.   2/16: Son was leaning towards hospice 2/17: After further discussion with family including daughter at bedside now they want to defer hospice and take him home with home health physical therapy

## 2021-12-11 NOTE — Progress Notes (Signed)
°   12/11/21 0845  Assess: MEWS Score  Temp 97.7 F (36.5 C)  BP (!) 152/105  Pulse Rate (!) 113  Resp 19  Level of Consciousness Alert  SpO2 95 %  O2 Device Nasal Cannula  Assess: MEWS Score  MEWS Temp 0  MEWS Systolic 0  MEWS Pulse 2  MEWS RR 0  MEWS LOC 0  MEWS Score 2  MEWS Score Color Yellow  Assess: if the MEWS score is Yellow or Red  Were vital signs taken at a resting state? Yes  Focused Assessment No change from prior assessment  Does the patient meet 2 or more of the SIRS criteria? No  MEWS guidelines implemented *See Row Information* Yes  Treat  MEWS Interventions Escalated (See documentation below)  Take Vital Signs  Increase Vital Sign Frequency  Yellow: Q 2hr X 2 then Q 4hr X 2, if remains yellow, continue Q 4hrs  Escalate  MEWS: Escalate Yellow: discuss with charge nurse/RN and consider discussing with provider and RRT  Notify: Charge Nurse/RN  Name of Charge Nurse/RN Notified Gay Filler RN  Date Charge Nurse/RN Notified 12/11/21  Time Charge Nurse/RN Notified [phone]

## 2021-12-11 NOTE — Assessment & Plan Note (Signed)
Son and wife were at the bedside and are agreeable for hospice/comfort care.  I have restarted her TOC to get hospice involved

## 2021-12-11 NOTE — TOC Initial Note (Signed)
Transition of Care [facility]) - Initial/Assessment Note    Patient Details  Name: George Bolton MRN: [id number] Date of Birth: [birth year]  Transition of Care [facility]) CM/SW Contact:    Alberteen Sam, LCSW Phone Number: 12/11/2021, 4:33 PM  Clinical Narrative:                  CSW spoke with patient's son per request of MD in stating that family was agreeable to patient going home with hospice. Per son Burman Nieves, they are in agreement with no preference of hospice agency, CSW has sent referral to [facility].   He reports needing wheelchair at home and purewick, is unsure about hospital bed would like to discuss further with MD. MD made aware. He reports patient lives in an older home with narrow doorways and hallways.   Confirmed address in chart is accurate.   TOC will continue to follow for additional needs, pending [facility] follow up to set up home hospice services and DME needs at this time.    Expected Discharge Plan: Home w Hospice Care Barriers to Discharge: Continued Medical Work up   Patient Goals and CMS Choice Patient states their goals for this hospitalization and ongoing recovery are:: to go home CMS Medicare.gov Compare Post Acute Care list provided to:: Patient Represenative (must comment) (son) Choice offered to / list presented to : Adult Children  Expected Discharge Plan and Services Expected Discharge Plan: [place] arrangements for the past 2 months: Single Family Home                                      Prior Living Arrangements/Services Living arrangements for the past 2 months: Single Family Home Lives with:: Self                   Activities of Daily Living Home Assistive Devices/Equipment: Cane (specify quad or straight), Walker (specify type) ADL Screening (condition at time of admission) Patient's cognitive ability adequate to safely complete daily activities?: Yes Is the patient deaf or have difficulty  hearing?: No Does the patient have difficulty seeing, even when wearing glasses/contacts?: No Does the patient have difficulty concentrating, remembering, or making decisions?: No Patient able to express need for assistance with ADLs?: Yes Does the patient have difficulty dressing or bathing?: No Independently performs ADLs?: Yes (appropriate for developmental age) Does the patient have difficulty walking or climbing stairs?: Yes Weakness of Legs: Both Weakness of Arms/Hands: None  Permission Sought/Granted                  Emotional Assessment              Admission diagnosis:  CHF exacerbation ([place]) [I50.9] NSVT (nonsustained ventricular tachycardia) [I47.29] Left leg swelling [M79.89] Hypertension, unspecified type [I10] Congestive heart failure, unspecified HF chronicity, unspecified heart failure type ([place]) [I50.9] Patient Active Problem List   Diagnosis Date Noted   Goals of care, counseling/discussion 12/11/2021   CKD (chronic kidney disease) stage 4, GFR 15-29 ml/min ([place]) 12/10/2021   NSVT (nonsustained ventricular tachycardia) 12/10/2021   Type 2 diabetes mellitus with diabetic nephropathy ([place]) 12/10/2021   Hyperkalemia 12/10/2021   Elevated troponin 12/10/2021   Recent cerebrovascular accident 11/22/21 (CVA) 12/10/2021   Hypertensive emergency 12/10/2021   Osteoarthritis of knee 56/25/6389   Acute embolic stroke ([place]) 37/34/2876   Community acquired bilateral lower  lobe pneumonia 01/64/2903   Metabolic acidemia 79/55/8316   Acute kidney injury superimposed on chronic kidney disease ([place]) 11/22/2021   CKD (chronic kidney disease), stage III ([place]) 05/21/2021   Acute on chronic diastolic CHF (congestive heart failure) ([place]) 12/01/2018   Atrial fibrillation ([place]) 03/15/2018   Lymphedema 03/15/2018   Gout 01/25/2017   Hypertension 04/25/2015   PCP:  Denton Lank, MD Pharmacy:   [facility] [address] ([address]), [place]) [place] [address] Phone: [phone] Fax: [phone]     Social Determinants of Health (SDOH) Interventions    Readmission Risk Interventions No flowsheet data found.

## 2021-12-11 NOTE — Progress Notes (Signed)
Progress Note   Patient: George Bolton [id number] DOB: May 11, 1941 DOA: 12/10/2021     1 DOS: the patient was seen and examined on 12/11/2021   Brief hospital course:  81 y.o. male with medical history significant of Recent hospitalization from 1/28 to 2/3 with acute embolic stroke and community-acquired pneumonia, history of CKD 4, atrial fibrillation, hypertension, gout, knee pain with recent MRI that showed bilateral meniscal tear and severe tricompartmental OA and MCL strain seen by orthopedics seen by Ortho during recent hospitalization, not a candidate for surgery due to recent stroke, who was sent from rehab for elevated blood pressures.   2/16: Family agreeable for hospice   Assessment and Plan: * Acute on chronic diastolic CHF (congestive heart failure) ([place]) Probably triggered by uncontrolled hypertension Continue IV Lasix Continue metoprolol, isosorbide Daily weights with intake and output monitoring EF on 1/28 was 60 to 65% with indeterminate diastolic parameters Family requesting hospice  Goals of care, counseling/discussion Son and wife were at the bedside and are agreeable for hospice/comfort care.  I have restarted her TOC to get hospice involved  Osteoarthritis of knee MRI  On 2/3 showed bilateral meniscal tear and severe tricompartmental OA and MCL strain  Seen by Ortho on 2/3, not a candidate for surgery due to recent stroke  Hypertensive emergency BP 179/131 on arrival, improving to 169/119 by admission Continue home metoprolol 50 mg twice daily Continue Imdur and  hydralazine 25 mg 3 times daily with monitoring for hypotension  Recent cerebrovascular accident 11/22/21 (CVA) CT head with no acute findings and patient has no acute neurologic deficits Continue Eliquis.  Not noted to be on statins Family leaning towards hospice  Elevated troponin Troponin 26-20, and continues to downtrend from recent past hospitalization when it peaked at 88 Patient denies  chest pain and EKG is nonacute.  Hyperkalemia Potassium of 5.4 without peaked T waves on EKG Now resolved  NSVT (nonsustained ventricular tachycardia) Patient reportedly had a few short runs of V. tach 45 beats while in the ED   CKD (chronic kidney disease) stage 4, GFR 15-29 ml/min (HCC) Renal function at baseline and improved from recent hospitalization.  Atrial fibrillation ([place])- (present on admission) Rate controlled Continue metoprolol Continue Eliquis.        Subjective: No new complaints.  Son and wife at bedside wants to keep him comfortable  Physical Exam: Vitals:   12/11/21 0845 12/11/21 0847 12/11/21 1024 12/11/21 1204  BP: (!) 152/105  (!) 146/104 (!) 142/111  Pulse: (!) 113  100 93  Resp: 19  19 19   Temp: 97.7 F (36.5 C)  97.8 F (36.6 C) 97.8 F (36.6 C)  TempSrc:   Oral Oral  SpO2: 95%  97% 98%  Weight:  83.1 kg    Height:  6' (1.829 m)     Constitutional:      General: He is not in acute distress.    Appearance: Normal appearance.  HENT:     Head: Normocephalic and atraumatic.  Cardiovascular:     Rate and Rhythm: Normal rate and regular rhythm.     Pulses: Normal pulses.     Heart sounds: Normal heart sounds. No murmur heard. Pulmonary:     Effort: Pulmonary effort is normal. .     Breath sounds: Normal breath sounds. No wheezing or rhonchi.  Abdominal:     General: Bowel sounds are normal.     Palpations: Abdomen is soft.     Tenderness: There is no abdominal tenderness.  Musculoskeletal:        General: No swelling or tenderness. Normal range of motion.     Cervical back: Normal range of motion and neck supple.     Right lower leg: Edema present.     Left lower leg: Edema present.  Skin:    General: Skin is warm and dry.  Neurological:     General: No focal deficit present.     Mental Status: He is alert. Mental status is at baseline.  Psychiatric:        Mood and Affect: Mood normal.        Behavior: Behavior normal.   Data  Reviewed:  Creatinine 2.51  Family Communication: Son and wife at bedside  Disposition: Status is: Inpatient Remains inpatient appropriate because: Further goals of care discussion to decide final disposition for hospice    DVT prophylaxis-Eliquis       Planned Discharge Destination: Long-term care with hospice     Time spent: 35 minutes  Author: Max Sane, MD 12/11/2021 4:29 PM  For on call review [email or web address].

## 2021-12-11 NOTE — Progress Notes (Signed)
[facility] [facility]) Hospital Liaison Note   Referral received for hospice services in the home. Family contacted. Plan is meet at patient beside tomorrow AM @ 9:30 with family.   Please call with any hospice related questions or concerns.   Thank you, Daphene Calamity, MSW [facility] Liaison [phone]

## 2021-12-12 DIAGNOSIS — R778 Other specified abnormalities of plasma proteins: Secondary | ICD-10-CM

## 2021-12-12 MED ORDER — HYDRALAZINE HCL 50 MG PO TABS
50.0000 mg | ORAL_TABLET | Freq: Four times a day (QID) | ORAL | Status: DC
Start: 2021-12-12 — End: 2021-12-13
  Administered 2021-12-12 – 2021-12-13 (×4): 50 mg via ORAL
  Filled 2021-12-12 (×4): qty 1

## 2021-12-12 NOTE — Assessment & Plan Note (Signed)
BP 179/131 on arrival, improving  Continue Lasix 40 mg IV twice daily, Imdur 30 mg p.o. daily, Lopressor 50 mg p.o. twice daily.  Increase hydralazine from 50 to 100 mg p.o. 4 times daily for better blood pressure control

## 2021-12-12 NOTE — Progress Notes (Signed)
PT Cancellation Note  Patient Details Name: George Bolton MRN: [id number] DOB: 30-Mar-1941   Cancelled Treatment:    Reason Eval/Treat Not Completed:  (Consult received and chart reviewed. Per discussion with attending physician, patient/family have elected to transition home with hospice services.  No skilled PT needs at this time.  Will complete initial order; please reconsult should needs change.)  [name] H. Owens Shark, PT, DPT, NCS 12/12/21, 10:12 AM [phone]

## 2021-12-12 NOTE — Assessment & Plan Note (Signed)
Potassium of 5.4 without peaked T waves on EKG Now resolved

## 2021-12-12 NOTE — Assessment & Plan Note (Signed)
Patient reportedly had a few short runs of V. tach 45 beats while in the ED.  No further NSVT while in the hospital

## 2021-12-12 NOTE — Care Management Important Message (Signed)
Important Message  Patient Details  Name: George Bolton MRN: [id number] Date of Birth: May 07, 1941   Medicare Important Message Given:  N/A - LOS <3 / Initial given by admissions     Dannette Barbara 12/12/2021, 10:18 AM

## 2021-12-12 NOTE — Assessment & Plan Note (Signed)
MRI  On 2/3 showed bilateral meniscal tear and severe tricompartmental OA and MCL strain  Seen by Ortho on 2/3, not a candidate for surgery due to recent stroke

## 2021-12-12 NOTE — Assessment & Plan Note (Signed)
Renal function at baseline.  Monitor

## 2021-12-12 NOTE — Progress Notes (Signed)
Progress Note   Patient: George Bolton [id number] DOB: Jun 06, 1941 DOA: 12/10/2021     2 DOS: the patient was seen and examined on 12/12/2021   Brief hospital course:  81 y.o. male with medical history significant of Recent hospitalization from 1/28 to 2/3 with acute embolic stroke and community-acquired pneumonia, history of CKD 4, atrial fibrillation, hypertension, gout, knee pain with recent MRI that showed bilateral meniscal tear and severe tricompartmental OA and MCL strain seen by orthopedics seen by Ortho during recent hospitalization, not a candidate for surgery due to recent stroke, who was sent from rehab for elevated blood pressures.   2/16: Son was leaning towards hospice 2/17: After further discussion with family including daughter at bedside now they want to defer hospice and take him home with home health physical therapy   Assessment and Plan: * Acute on chronic diastolic CHF (congestive heart failure) ([place])- (present on admission) Probably triggered by uncontrolled hypertension Continue IV Lasix 40 mg twice daily Continue metoprolol, isosorbide Net IO Since Admission: -3,760 mL [12/12/21 1652] EF on 1/28 was 60 to 65% with indeterminate diastolic parameters   Goals of care, counseling/discussion After further discussion with patient's family -they have decided to defer hospice at this time and would like to take him home with home health PT and outpatient palliative care  Osteoarthritis of knee MRI  On 2/3 showed bilateral meniscal tear and severe tricompartmental OA and MCL strain  Seen by Ortho on 2/3, not a candidate for surgery due to recent stroke  Hypertensive emergency- (present on admission) BP 179/131 on arrival, improving  Continue Lasix 40 mg IV twice daily, Imdur 30 mg p.o. daily, Lopressor 50 mg p.o. twice daily.  Increase hydralazine from 50 to 100 mg p.o. 4 times daily for better blood pressure control  Recent cerebrovascular accident 11/22/21  (CVA) CT head with no acute findings and patient has no acute neurologic deficits Continue Eliquis.  Not noted to be on statins  Elevated troponin- (present on admission) Troponin 26-20, and continues to downtrend from recent past hospitalization when it peaked at 88 Patient denies chest pain and EKG is nonacute.  Hyperkalemia- (present on admission) Potassium of 5.4 without peaked T waves on EKG Now resolved  NSVT (nonsustained ventricular tachycardia)- (present on admission) Patient reportedly had a few short runs of V. tach 45 beats while in the ED.  No further NSVT while in the hospital   CKD (chronic kidney disease) stage 4, GFR 15-29 ml/min (HCC)- (present on admission) Renal function at baseline.  Monitor  Atrial fibrillation ([place])- (present on admission) Rate controlled with metoprolol Continue Eliquis for anticoagulation.        Subjective: Blood pressure remains high although improved.  No new issues.  Son and daughter at bedside  Physical Exam: Vitals:   12/12/21 0400 12/12/21 0812 12/12/21 1226 12/12/21 1643  BP: (!) 156/110 (!) 152/129 (!) 133/95 (!) 159/124  Pulse: 76 94 97 (!) 105  Resp: 20 18 17 17   Temp: 98.4 F (36.9 C) 98.3 F (36.8 C) 98.2 F (36.8 C) 98.3 F (36.8 C)  TempSrc:  Oral Oral Oral  SpO2: 99% 98% 98% 100%  Weight: 77.3 kg     Height:       Constitutional:  General: He is not in acute distress. Appearance: Normal appearance.  HENT:  Head: Normocephalicand atraumatic.  Cardiovascular:  Rate and Rhythm: Normal rateand regular rhythm.  Pulses: Normal pulses.  Heart sounds: Normal heart sounds.No murmurheard. Pulmonary:  Effort: Pulmonary effort  is normal..  Breath sounds: Normal breath sounds. No wheezingorrhonchi.  Abdominal:  General: Bowel sounds are normal.  Palpations: Abdomen is soft.  Tenderness: There is no abdominal tenderness.  Musculoskeletal:  General: No swellingor  tenderness.Normal range of motion.  Cervical back: Normal range of motionand neck supple.  Right lower leg: Edemapresent.  Left lower leg: Edemapresent.  Skin: General: Skin is warmand dry.  Neurological:  General: No focal deficitpresent.  Mental Status: He is alert. Mental status isat baseline.  Psychiatric:  Mood and Affect: Moodnormal.  Behavior: Behaviornormal.   Data Reviewed:  Creatinine 2.51.  WBC 11.8  Family Communication: Daughter and son and daughter at bedside.  Also discussed with son over phone later in the day  Disposition: Status is: Inpatient Remains inpatient appropriate because: Getting diuresed    DVT prophylaxis-Eliquis       Planned Discharge Destination: Home with Home Health     Time spent: 35 minutes  Author: Max Sane, MD 12/12/2021 4:55 PM  For on call review [email or web address].

## 2021-12-12 NOTE — Progress Notes (Addendum)
[facility]) Hospital Liaison Note   Received request from Transitions of Care Manager, Caryl Pina, for hospice services at home after discharge. Chart and patient information under review by [facility] physician. Hospice eligibility pending.   Spoke with Deandre & patient to initiate education related to hospice philosophy, services, and team approach to care. Deandre verbalized understanding of information given. Per discussion, the plan is for patient to discharge home via [facility] once cleared to DC.    DME needs discussed. Patient has the following equipment in the home (Purchased privately or [employer]): [place] Patient requests the following equipment for delivery: Pending DME needs, family continues to have ongoing [place] conversation and have requested for DME order to be held   Address verified and is correct in the chart.    Please send signed and completed DNR home with patient/family. Please provide prescriptions at discharge as needed to ensure ongoing symptom management.    [facility] information and contact numbers given to family & above information shared with TOC.  Addendum: 5:17p MSW previosuly contacted son, Burman Nieves, to provide updates of hospice approval (2:37p). VM left requesting call be returned. MSW later notified that family has decided to return home with home health services vs hospice. MD made referral for PMT to follow. [place] PMT notified.   Please call with any questions/concerns.    Thank you for the opportunity to participate in this patient's care.   Daphene Calamity, MSW [facility] Liaison  [phone]

## 2021-12-12 NOTE — Assessment & Plan Note (Signed)
Rate controlled with metoprolol Continue Eliquis for anticoagulation.

## 2021-12-12 NOTE — Assessment & Plan Note (Signed)
Troponin 26-20, and continues to downtrend from recent past hospitalization when it peaked at 88 Patient denies chest pain and EKG is nonacute.

## 2021-12-12 NOTE — Assessment & Plan Note (Signed)
Probably triggered by uncontrolled hypertension Continue IV Lasix 40 mg twice daily Continue metoprolol, isosorbide Net IO Since Admission: -3,760 mL [12/12/21 1652] EF on 1/28 was 60 to 65% with indeterminate diastolic parameters

## 2021-12-12 NOTE — Assessment & Plan Note (Signed)
CT head with no acute findings and patient has no acute neurologic deficits Continue Eliquis.  Not noted to be on statins

## 2021-12-12 NOTE — Assessment & Plan Note (Signed)
After further discussion with patient's family -they have decided to defer hospice at this time and would like to take him home with home health PT and outpatient palliative care

## 2021-12-13 DIAGNOSIS — I509 Heart failure, unspecified: Secondary | ICD-10-CM

## 2021-12-13 DIAGNOSIS — M7989 Other specified soft tissue disorders: Secondary | ICD-10-CM

## 2021-12-13 DIAGNOSIS — I10 Essential (primary) hypertension: Secondary | ICD-10-CM

## 2021-12-13 DIAGNOSIS — I4729 Other ventricular tachycardia: Secondary | ICD-10-CM

## 2021-12-13 LAB — BASIC METABOLIC PANEL
Anion gap: 10 (ref 5–15)
BUN: 46 mg/dL — ABNORMAL HIGH (ref 8–23)
CO2: 26 mmol/L (ref 22–32)
Calcium: 9.4 mg/dL (ref 8.9–10.3)
Chloride: 102 mmol/L (ref 98–111)
Creatinine, Ser: 2.72 mg/dL — ABNORMAL HIGH (ref 0.61–1.24)
GFR, Estimated: 23 mL/min — ABNORMAL LOW (ref >60)
Glucose, Bld: 112 mg/dL — ABNORMAL HIGH (ref 70–99)
Potassium: 4.4 mmol/L (ref 3.5–5.1)
Sodium: 138 mmol/L (ref 135–145)

## 2021-12-13 LAB — CBC
HCT: 43.5 % (ref 39.0–52.0)
Hemoglobin: 14.3 g/dL (ref 13.0–17.0)
MCH: 27.2 pg (ref 26.0–34.0)
MCHC: 32.9 g/dL (ref 30.0–36.0)
MCV: 82.7 fL (ref 80.0–100.0)
Platelets: 229 10*3/uL (ref 150–400)
RBC: 5.26 MIL/uL (ref 4.22–5.81)
RDW: 17.4 % — ABNORMAL HIGH (ref 11.5–15.5)
WBC: 10.9 10*3/uL — ABNORMAL HIGH (ref 4.0–10.5)
nRBC: 0 % (ref 0.0–0.2)

## 2021-12-13 MED ORDER — FUROSEMIDE 20 MG PO TABS: 20.0000 mg | ORAL_TABLET | Freq: Every day | ORAL | 0 refills | Status: DC

## 2021-12-13 MED ORDER — HYDRALAZINE HCL 50 MG PO TABS
50.0000 mg | ORAL_TABLET | Freq: Four times a day (QID) | ORAL | 0 refills | Status: DC
Start: 2021-12-13 — End: 2021-12-30

## 2021-12-13 NOTE — Discharge Summary (Addendum)
Physician Discharge Summary   Patient: George Bolton MRN: [id number] DOB: 1941/06/29  Admit date:     12/10/2021  Discharge date: 12/13/21  Discharge Physician: Max Sane   PCP: Denton Lank, MD   Recommendations at discharge:   Follow-up with outpatient providers as requested  Discharge Diagnoses: Principal Problem:   Acute on chronic diastolic CHF (congestive heart failure) (HCC) Active Problems:   Congestive heart failure (HCC)   Atrial fibrillation (HCC)   CKD (chronic kidney disease) stage 4, GFR 15-29 ml/min (HCC)   NSVT (nonsustained ventricular tachycardia)   Hyperkalemia   Elevated troponin   Recent cerebrovascular accident 11/22/21 (CVA)   Hypertensive emergency   Osteoarthritis of knee   Goals of care, counseling/discussion   Left leg swelling   Hospital Course:  81 y.o. male with medical history significant of Recent hospitalization from 1/28 to 2/3 with acute embolic stroke and community-acquired pneumonia, history of CKD 4, atrial fibrillation, hypertension, gout, knee pain with recent MRI that showed bilateral meniscal tear and severe tricompartmental OA and MCL strain seen by orthopedics seen by Ortho during recent hospitalization, not a candidate for surgery due to recent stroke, who was sent from rehab for elevated blood pressures.   2/16: Son was leaning towards hospice 2/17: After further discussion with family including daughter at bedside now they want to defer hospice and take him home with home health physical therapy  Assessment and Plan: * Acute on chronic diastolic CHF (congestive heart failure) ([place])- (present on admission) Probably triggered by uncontrolled hypertension Diuresed with IV Lasix while in the hospital Continue metoprolol, isosorbide  Net IO Since Admission: -6,080 mL [12/13/21 1416]  EF on 1/28 was 60 to 65% with indeterminate diastolic parameters  Goals of care, counseling/discussion Family is requesting home health PT, OT and  palliative care at this time with consideration of transition to hospice as an outpatient  Osteoarthritis of knee MRI  On 2/3 showed bilateral meniscal tear and severe tricompartmental OA and MCL strain  Seen by Ortho on 2/3, not a candidate for surgery due to recent stroke  Hypertensive emergency- (present on admission) BP 179/131 on arrival, improving with current regimen.  May need further adjustment in blood pressure medicine as an outpatient  Recent cerebrovascular accident 11/22/21 (CVA) CT head with no acute findings and patient has no acute neurologic deficits Continue Eliquis.  Elevated troponin- (present on admission) Due to demand ischemia  Hyperkalemia- (present on admission) Potassium of 5.4 without peaked T waves on EKG Now resolved  NSVT (nonsustained ventricular tachycardia)- (present on admission) Patient reportedly had a few short runs of V. tach 45 beats while in the ED.  No further NSVT while in the hospital  CKD (chronic kidney disease) stage 4, GFR 15-29 ml/min (HCC)- (present on admission) Renal function at baseline.    Atrial fibrillation ([place])- (present on admission) Rate controlled with metoprolol Continue Eliquis for anticoagulation.        Consultants: Palliative care Disposition: Home health PT, OT, RN and palliative care to follow with consideration of transition to hospice if family in agreement Diet recommendation:  Discharge Diet Orders (From admission, onward)     Start     Ordered   12/13/21 0000  Diet - low sodium heart healthy        12/13/21 0849           Carb modified diet  DISCHARGE MEDICATION: Allergies as of 12/13/2021       Reactions   Cortisone Anaphylaxis  Triamcinolone    Other reaction(s): Unknown        Medication List     STOP taking these medications    polyethylene glycol 17 g packet Commonly known as: MIRALAX / GLYCOLAX   traMADol 50 MG tablet Commonly known as: ULTRAM       TAKE these  medications    (feeding supplement) PROSource Plus liquid Take 30 mLs by mouth 3 (three) times daily between meals.   acetaminophen 325 MG tablet Commonly known as: TYLENOL Take 2 tablets (650 mg total) by mouth every 6 (six) hours as needed for mild pain (or Fever >/= 101).   allopurinol 100 MG tablet Commonly known as: ZYLOPRIM Take 0.5 tablets (50 mg total) by mouth daily.   apixaban 2.5 MG Tabs tablet Commonly known as: ELIQUIS Take 2.5 mg by mouth 2 (two) times daily.   cloNIDine 0.2 MG tablet Commonly known as: CATAPRES Take 0.1 mg by mouth 2 (two) times daily.   colchicine 0.6 MG tablet Take 0.5 tablets (0.3 mg total) by mouth daily.   escitalopram 10 MG tablet Commonly known as: LEXAPRO Take 10 mg by mouth daily.   famotidine 10 MG tablet Commonly known as: PEPCID Take 1 tablet (10 mg total) by mouth daily.   furosemide 20 MG tablet Commonly known as: LASIX Take 1 tablet (20 mg total) by mouth daily. Hold till 12/16/21 and restart on 12/17/21 Start taking on: December 17, 2021 What changed:  additional instructions These instructions start on December 17, 2021. If you are unsure what to do until then, ask your doctor or other care provider.   hydrALAZINE 50 MG tablet Commonly known as: APRESOLINE Take 1 tablet (50 mg total) by mouth 4 (four) times daily. What changed:  medication strength how much to take   isosorbide mononitrate 30 MG 24 hr tablet Commonly known as: IMDUR Take 30 mg by mouth daily.   metoprolol tartrate 50 MG tablet Commonly known as: LOPRESSOR Take 1 tablet (50 mg total) by mouth 2 (two) times daily.   multivitamin with minerals Tabs tablet Take 1 tablet by mouth daily.   predniSONE 20 MG tablet Commonly known as: DELTASONE Take 20 mg by mouth daily with breakfast.   rosuvastatin 20 MG tablet Commonly known as: Crestor Take 1 tablet (20 mg total) by mouth daily.        Follow-up Information     Denton Lank, MD. Schedule  an appointment as soon as possible for a visit in 1 week(s).   Specialty: Family Medicine Why: [facility] Discharge F/UP Contact information: [address] [address] [phone]         Minna Merritts, MD. Schedule an appointment as soon as possible for a visit in 2 week(s).   Specialty: Cardiology Why: [facility] Discharge F/UP Contact information: [place] [phone]         Lavonia Dana, MD. Schedule an appointment as soon as possible for a visit in 2 week(s).   Specialty: Nephrology Why: [facility] Discharge F/UP Contact information: [place] [address] [phone]                 Discharge Exam: [place] Weights   12/11/21 0847 12/12/21 0400 12/13/21 0500  Weight: 83.1 kg 77.3 kg 78.5 kg   Constitutional:      General: He is not in acute distress.    Appearance: Normal appearance.  HENT:  Head: Normocephalic and atraumatic.  Cardiovascular:     Rate and Rhythm: Normal rate and regular rhythm.     Pulses: Normal pulses.     Heart sounds: Normal heart sounds. No murmur heard. Pulmonary:     Effort: Pulmonary effort is normal. .     Breath sounds: Normal breath sounds. No wheezing or rhonchi.  Abdominal:     General: Bowel sounds are normal.     Palpations: Abdomen is soft.     Tenderness: There is no abdominal tenderness.  Musculoskeletal:        General: No swelling or tenderness. Normal range of motion.     Cervical back: Normal range of motion and neck supple.     Right lower leg: Edema present.     Left lower leg: Edema present.  Skin:    General: Skin is warm and dry.  Neurological:     General: No focal deficit present.     Mental Status: He is alert. Mental status is at baseline.  Psychiatric:        Mood and Affect: Mood normal.        Behavior: Behavior normal.     Condition at discharge: fair  The results of  significant diagnostics from this hospitalization (including imaging, microbiology, ancillary and laboratory) are listed below for reference.   Imaging Studies: DG Chest 2 View  Result Date: 11/22/2021 CLINICAL DATA:  Weakness. EXAM: CHEST - 2 VIEW COMPARISON:  November 21, 2021. FINDINGS: Stable cardiomediastinal silhouette. Mildly increased bibasilar atelectasis or edema is noted. Bony thorax is unremarkable. IMPRESSION: Mildly increased bibasilar atelectasis or edema. Electronically Signed   By: Marijo Conception M.D.   On: 11/22/2021 12:36   DG Chest 2 View  Result Date: 11/21/2021 CLINICAL DATA:  Leukocytosis EXAM: CHEST - 2 VIEW COMPARISON:  06/28/2021 FINDINGS: Transverse diameter of heart is increased. There is poor inspiration. There are no signs of alveolar pulmonary edema or focal pulmonary consolidation. There is no pleural effusion or pneumothorax. IMPRESSION: Cardiomegaly. Poor inspiration. There are no signs of pulmonary edema or focal pulmonary consolidation. Electronically Signed   By: Elmer Picker M.D.   On: 11/21/2021 18:40   DG Knee 1-2 Views Right  Result Date: 11/27/2021 CLINICAL DATA:  Right knee pain. EXAM: RIGHT KNEE - 1-2 VIEW COMPARISON:  None. FINDINGS: No fracture or dislocation. Severe tricompartmental degenerative change of the knee, worse within the medial compartment with near complete joint space loss, bone-on-bone articulation, subchondral sclerosis and osteophytosis. There is minimal lateral deviation of the tibial plateaus and relation to the femoral condyles. There are several loose bodies seen within the suprapatellar knee joint space though discrete donor sites are not identified. No definite joint effusion. No evidence of chondrocalcinosis. Suspected mild diffuse soft tissue swelling about the knee. No radiopaque foreign body. IMPRESSION: 1. Diffuse soft tissue swelling about the knee without associated fracture or dislocation. 2. Severe tricompartmental  degenerative change of the knee, worst in the medial compartment. 3. Loose bodies are identified within the suprapatellar knee joint space no discrete donor site is not identified, presumably the sequela of chronic advanced degenerative change. Electronically Signed   By: Sandi Mariscal M.D.   On: 11/27/2021 09:33   CT HEAD WO CONTRAST (5MM)  Result Date: 12/10/2021 CLINICAL DATA:  Sudden severe headache. Hypertension. History of stroke. Patient on blood thinners. EXAM: CT HEAD WITHOUT CONTRAST TECHNIQUE: Contiguous axial images were obtained from the base of the skull through the vertex without intravenous contrast. RADIATION  DOSE REDUCTION: This exam was performed according to the departmental dose-optimization program which includes automated exposure control, adjustment of the mA and/or kV according to patient size and/or use of iterative reconstruction technique. COMPARISON:  CT brain 11/28/2021 FINDINGS: Brain: There is moderate cortical atrophy, unchanged from prior and within normal limits for patient age. This is again greatest within the left frontoparietal region. The ventricles are normal in configuration. The basilar cisterns are patent. No mass, mass effect, or midline shift. No acute intracranial hemorrhage is seen. No abnormal extra-axial fluid collection. Moderate periventricular and subcortical white matter patchy hypodensities, unchanged from prior and most likely secondary to chronic ischemic white matter changes. There are atherosclerotic intracranial calcifications. Preservation of the normal cortical gray-white interface without CT evidence of an acute major vascular territorial cortical based infarction. Vascular: No hyperdense vessel or unexpected calcification. Skull: Normal. Negative for fracture or focal lesion. Sinuses/Orbits: The visualized orbits are unremarkable. The visualized paranasal sinuses and mastoid air cells are clear. Other: None. IMPRESSION: No significant change from  prior.  No acute intracranial process. Moderate atrophy and moderate chronic ischemic white matter changes. Electronically Signed   By: Yvonne Kendall M.D.   On: 12/10/2021 17:43   CT HEAD WO CONTRAST (5MM)  Result Date: 11/28/2021 CLINICAL DATA:  Follow-up stroke for hemorrhagic conversion. EXAM: CT HEAD WITHOUT CONTRAST TECHNIQUE: Contiguous axial images were obtained from the base of the skull through the vertex without intravenous contrast. RADIATION DOSE REDUCTION: This exam was performed according to the departmental dose-optimization program which includes automated exposure control, adjustment of the mA and/or kV according to patient size and/or use of iterative reconstruction technique. COMPARISON:  11/26/2021 CT brain, 11/25/2021 MRI brain FINDINGS: Brain: No evidence of acute infarction, hemorrhage, extra-axial collection, ventriculomegaly, or mass effect. Left frontoparietal lobe encephalomalacia. Acute-subacute left frontal lobe infarcts seen on MRI are not well appreciated on the current exam. Generalized cerebral atrophy. Periventricular white matter low attenuation likely secondary to microangiopathy. Vascular: Cerebrovascular atherosclerotic calcifications are noted. Skull: Negative for fracture or focal lesion. Sinuses/Orbits: Visualized portions of the orbits are unremarkable. Visualized portions of the paranasal sinuses are unremarkable. Visualized portions of the mastoid air cells are unremarkable. Other: None. IMPRESSION: 1. Left frontal lobe infarcts seen on MRI dated 11/25/2021 are not well appreciated on the current examination. No acute intracranial hemorrhage. Are not well appreciated on the current exam. 2. No new acute intracranial pathology. Electronically Signed   By: Kathreen Devoid M.D.   On: 11/28/2021 15:41   CT HEAD WO CONTRAST (5MM)  Result Date: 11/26/2021 CLINICAL DATA:  Worsening dysarthria and lethargy, acute stroke on MRI yesterday EXAM: CT HEAD WITHOUT CONTRAST TECHNIQUE:  Contiguous axial images were obtained from the base of the skull through the vertex without intravenous contrast. RADIATION DOSE REDUCTION: This exam was performed according to the departmental dose-optimization program which includes automated exposure control, adjustment of the mA and/or kV according to patient size and/or use of iterative reconstruction technique. COMPARISON:  11/25/2021 FINDINGS: Brain: Confluent hypodensities throughout the periventricular and subcortical white matter are again identified, unchanged since prior exam and consistent with chronic small vessel ischemic changes. The acute infarct within the left frontal subcortical white matter on prior MRI is not readily apparent on CT. Chronic ischemic changes are also seen within the bilateral cerebellar hemispheres and left occipital lobe. No new infarct or hemorrhage. Lateral ventricles and midline structures are stable. No acute extra-axial fluid collections. No mass effect. Vascular: No hyperdense vessel or unexpected calcification. Skull: Normal.  Negative for fracture or focal lesion. Sinuses/Orbits: No acute finding. Other: None. IMPRESSION: 1. Chronic small-vessel ischemic changes throughout the bilateral periventricular and subcortical white matter. The acute left frontal subcortical white matter infarcts seen on recent MRI are not readily apparent by CT. 2. 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 3. No new infarct or acute hemorrhage. Electronically Signed   By: Randa Ngo M.D.   On: 11/26/2021 16:48   CT HEAD WO CONTRAST (5MM)  Result Date: 11/25/2021 CLINICAL DATA:  Generalized weakness. EXAM: CT HEAD WITHOUT CONTRAST TECHNIQUE: Contiguous axial images were obtained from the base of the skull through the vertex without intravenous contrast. RADIATION DOSE REDUCTION: This exam was performed according to the departmental dose-optimization program which includes automated  exposure control, adjustment of the mA and/or kV according to patient size and/or use of iterative reconstruction technique. COMPARISON:  April 19, 2020. 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 No mass effect or midline shift is noted. Ventricular size is within normal limits. There is no evidence of mass lesion, hemorrhage or acute infarction. Vascular: No hyperdense vessel or unexpected calcification. Skull: Normal. Negative for fracture or focal lesion. Sinuses/Orbits: No acute finding. Other: None. IMPRESSION: No acute intracranial abnormality seen. Electronically Signed   By: Marijo Conception M.D.   On: 11/25/2021 11:53   MR BRAIN WO CONTRAST  Result Date: 11/25/2021 CLINICAL DATA:  Left-sided weakness, stroke suspected EXAM: MRI HEAD WITHOUT CONTRAST TECHNIQUE: Multiplanar, multiecho pulse sequences of the brain and surrounding structures were obtained without intravenous contrast. COMPARISON:  No prior MRI correlation is made with CT head 11/25/2021 FINDINGS: Evaluation is somewhat limited by motion and patient inability to cooperate, with a truncated exam obtained. Only axial and coronal diffusion-weighted sequences, axial FLAIR, and susceptibility weighted images were obtained. Within this limitation, restricted diffusion with ADC correlate in the left frontal lobe (series 9, images 32-35). No other restricted diffusion. No acute hemorrhage, mass, mass effect, or midline shift. Ventriculomegaly, which appears in proportion to sulcal size, likely reflecting greater than expected cerebral volume loss for age. Confluent T2 hyperintense signal in the periventricular white matter, likely the sequela of severe chronic small vessel ischemic disease. 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 No extra-axial collection. No significant sinus mucosal thickening or fluid in the mastoid air  cells. Evaluation the orbits is limited by motion. No diffusion restricting calvarial lesion. IMPRESSION: Evaluation is somewhat limited by motion and patient inability to cooperate with the exam, with only certain sequences obtained. 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 No significant mass effect or midline shift. These results will be called to the ordering clinician or representative by the Radiologist Assistant, and communication documented in the PACS or [employer]. Electronically Signed   By: Merilyn Baba M.D.   On: 11/25/2021 22:39   US RENAL  Result Date: 11/22/2021 CLINICAL DATA:  Renal dysfunction EXAM: RENAL / URINARY TRACT ULTRASOUND COMPLETE COMPARISON:  08/28/2020 FINDINGS: Right Kidney: Renal measurements: 9.4 x 5.3 x 4.8 cm = volume: 272.1 mL. There is increased cortical echogenicity. There is no hydronephrosis. There are multiple cysts in the right kidney largest measuring 5.4 x 5 x 5.6 cm in the upper pole. Left Kidney: Renal measurements: 13.6 x 7.6 x 5.9 cm = volume: 317.9 mL. There is no hydronephrosis. There is increased  cortical echogenicity. There are multiple cysts of varying sizes largest measuring 5.7 x 4.9 x 5.1 cm in the midportion. Overall, no significant interval changes are noted. Bladder: Appears normal for degree of bladder distention. Other: None. IMPRESSION: There is no hydronephrosis. There is increased cortical echogenicity in both kidneys suggesting medical renal disease. Bilateral renal cysts. Electronically Signed   By: Elmer Picker M.D.   On: 11/22/2021 12:42   MR KNEE RIGHT WO CONTRAST  Result Date: 11/28/2021 CLINICAL DATA:  Pain and swelling.  [place] in September or October. EXAM: MRI OF THE RIGHT KNEE WITHOUT CONTRAST TECHNIQUE: Multiplanar, multisequence MR imaging of the knee was performed. No intravenous contrast was administered. COMPARISON:  None. FINDINGS:  MENISCI Medial: Maceration of the anterior horn-body junction of the medial meniscus. Large complex tear of the posterior horn of the medial meniscus with a radial component at the root of the posterior horn. Lateral: Maceration of the anterior horn of the lateral meniscus. Degeneration of the body and posterior horn of the lateral meniscus. Oblique tear of the posterior horn of the lateral meniscus extending to the inferior articular surface. LIGAMENTS Cruciates: Intact PCL.  Complete chronic ACL tear. Collaterals: Thickening of the proximal MCL most consistent with proximal MCL strain without a complete tear. Lateral collateral ligament complex is intact. CARTILAGE Patellofemoral: Extensive full-thickness cartilage loss of the patellofemoral compartment. Medial: Extensive full-thickness cartilage loss of the medial femorotibial compartment. Lateral: High-grade partial-thickness cartilage loss with areas of full-thickness cartilage loss of the lateral femorotibial compartment. JOINT: Moderate joint effusion. Prominence of synovial fat in the suprapatellar joint space. Normal Hoffa's fat-pad. No plical thickening. POPLITEAL FOSSA: Mild tendinosis of the popliteus tendon. Small Baker's cyst. EXTENSOR MECHANISM: Intact quadriceps tendon. Intact patellar tendon. Intact lateral patellar retinaculum. Intact medial patellar retinaculum. Intact MPFL. BONES: No aggressive osseous lesion. No fracture or dislocation. Tricompartmental marginal osteophytes. Other: No fluid collection or hematoma. Muscles are normal. Generalized IMPRESSION: 1. 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 2. Proximal MCL strain without a complete tear. 3. Maceration of the anterior horn-body junction of the medial meniscus. Large complex tear of the posterior horn of the medial meniscus with a radial component at the root of the posterior horn. 4.  Maceration of the anterior horn of the lateral meniscus. Degeneration of the body and posterior horn of the lateral meniscus. Oblique tear of the posterior horn of the lateral meniscus extending to the inferior articular surface. 5. Complete chronic ACL tear. Electronically Signed   By: Kathreen Devoid M.D.   On: 11/28/2021 07:27   US Carotid Bilateral  Result Date: 11/27/2021 CLINICAL DATA:  History of hypertension and stroke. EXAM: BILATERAL CAROTID DUPLEX ULTRASOUND TECHNIQUE: Pearline Cables scale imaging, color Doppler and duplex ultrasound were performed of bilateral carotid and vertebral arteries in the neck. COMPARISON:  None. FINDINGS: Examination is degraded secondary to patient's inability to tolerate standard positioning. Criteria: Quantification of carotid stenosis is based on velocity parameters that correlate the residual internal carotid diameter with NASCET-based stenosis levels, using the diameter of the distal internal carotid lumen as the denominator for stenosis measurement. The following velocity measurements were obtained: RIGHT ICA: 36/9 cm/sec CCA: 20/9 cm/sec SYSTOLIC ICA/CCA RATIO:  0.8 ECA: 52 cm/sec LEFT ICA: 87/15 cm/sec CCA: 47/0 cm/sec SYSTOLIC ICA/CCA RATIO:  2.6 ECA: 41 cm/sec RIGHT CAROTID ARTERY: There is a minimal amount of intimal wall thickening involving the left internal carotid artery, not resulting in elevated peak systolic velocities  within the interrogated course of the left internal carotid artery to suggest a hemodynamically significant stenosis. RIGHT VERTEBRAL ARTERY:  Antegrade flow LEFT CAROTID ARTERY: There is a very minimal amount of wall thickening involving the left carotid bulb, origin and proximal aspect of the left internal carotid artery, not resulting in elevated peak systolic velocities within the interrogated course of the left internal carotid artery to suggest a hemodynamically significant stenosis. LEFT VERTEBRAL ARTERY:  Antegrade flow IMPRESSION: Very minimal  moderate bilateral intimal wall thickening, not resulting in a hemodynamically significant stenosis within either internal carotid artery. Electronically Signed   By: Sandi Mariscal M.D.   On: 11/27/2021 15:22   US Venous Img Lower Unilateral Left (DVT)  Result Date: 12/10/2021 CLINICAL DATA:  Left leg swelling EXAM: LEFT LOWER EXTREMITY VENOUS DOPPLER ULTRASOUND TECHNIQUE: Gray-scale sonography with compression, as well as color and duplex ultrasound, were performed to evaluate the deep venous system(s) from the level of the common femoral vein through the popliteal and proximal calf veins. COMPARISON:  02/15/2021 FINDINGS: VENOUS Normal compressibility of the common femoral, superficial femoral, and popliteal veins, as well as the visualized calf veins. Visualized portions of profunda femoral vein and great saphenous vein unremarkable. No filling defects to suggest DVT on grayscale or color Doppler imaging. Doppler waveforms show normal direction of venous flow, normal respiratory plasticity and response to augmentation. Limited views of the contralateral common femoral vein are unremarkable. OTHER Subcutaneous edema throughout the left calf. Limitations: none IMPRESSION: 1. No evidence of deep venous thrombosis within the left lower extremity. 2. Subcutaneous edema throughout the left calf. Electronically Signed   By: Randa Ngo M.D.   On: 12/10/2021 19:05   DG Chest Portable 1 View  Result Date: 12/10/2021 CLINICAL DATA:  Shortness of breath. EXAM: PORTABLE CHEST 1 VIEW COMPARISON:  11/22/2021 FINDINGS: 1630 hours. The cardio pericardial silhouette is enlarged. There is pulmonary vascular congestion without overt pulmonary edema. Diffuse interstitial opacity at the bases suggest dependent edema. No pleural effusion. Telemetry leads overlie the chest. IMPRESSION: Enlarged cardiopericardial silhouette with pulmonary vascular congestion and probable dependent edema. Electronically Signed   By: Misty Stanley M.D.   On: 12/10/2021 16:40   ECHOCARDIOGRAM COMPLETE  Result Date: 11/22/2021    ECHOCARDIOGRAM REPORT   Patient Name:   BASIR NIVEN Date of Exam: 11/22/2021 Medical Rec #:  [id number]       Height:       72.0 in Accession #:    [id number]      Weight:       175.0 lb Date of Birth:  12-09-1940       BSA:          2.013 m Patient Age:    81 years        BP:           110/91 mmHg Patient Gender: M               HR:           122 bpm. Exam Location:  [facility] Procedure: 2D Echo Indications:     Elevated Troponin  History:         Patient has prior history of Echocardiogram examinations, most                  recent 03/03/2018.  Sonographer:     Kathlen Brunswick RDCS Referring Phys:  [id number] [place] Diagnosing Phys: [place]  1. Left ventricular ejection fraction, by  estimation, is 60 to 65%. The left ventricle has normal function. The left ventricle has no regional wall motion abnormalities. There is moderate concentric left ventricular hypertrophy. Left ventricular diastolic parameters are indeterminate.  2. Right ventricular systolic function is normal. The right ventricular size is normal.  3. Left atrial size was moderately dilated.  4. The mitral valve is normal in structure. No evidence of mitral valve regurgitation. No evidence of mitral stenosis.  5. The aortic valve is normal in structure. Aortic valve regurgitation is not visualized. No aortic stenosis is present.  6. The inferior vena cava is normal in size with greater than 50% respiratory variability, suggesting right atrial pressure of 3 mmHg. FINDINGS  Left Ventricle: Left ventricular ejection fraction, by estimation, is 60 to 65%. The left ventricle has normal function. The left ventricle has no regional wall motion abnormalities. The left ventricular internal cavity size was normal in size. There is  moderate concentric left ventricular hypertrophy. Left ventricular diastolic parameters are indeterminate. Right Ventricle:  The right ventricular size is normal. No increase in right ventricular wall thickness. Right ventricular systolic function is normal. Left Atrium: Left atrial size was moderately dilated. Right Atrium: Right atrial size was normal in size. Pericardium: There is no evidence of pericardial effusion. Mitral Valve: The mitral valve is normal in structure. No evidence of mitral valve regurgitation. No evidence of mitral valve stenosis. Tricuspid Valve: The tricuspid valve is normal in structure. Tricuspid valve regurgitation is not demonstrated. No evidence of tricuspid stenosis. Aortic Valve: The aortic valve is normal in structure. Aortic valve regurgitation is not visualized. No aortic stenosis is present. Aortic valve peak gradient measures 3.6 mmHg. Pulmonic Valve: The pulmonic valve was normal in structure. Pulmonic valve regurgitation is not visualized. No evidence of pulmonic stenosis. Aorta: The aortic root is normal in size and structure. Venous: The inferior vena cava is normal in size with greater than 50% respiratory variability, suggesting right atrial pressure of 3 mmHg. IAS/Shunts: No atrial level shunt detected by color flow Doppler.  LEFT VENTRICLE PLAX 2D LVIDd:         3.60 cm   Diastology LVIDs:         2.50 cm   LV e' medial:    5.66 cm/s LV PW:         1.60 cm   LV E/e' medial:  14.2 LV IVS:        1.60 cm   LV e' lateral:   11.90 cm/s LVOT diam:     2.00 cm   LV E/e' lateral: 6.8 LV SV:         36 LV SV Index:   18 LVOT Area:     3.14 cm  RIGHT VENTRICLE RV Basal diam:  3.20 cm TAPSE (M-mode): 1.7 cm LEFT ATRIUM             Index        RIGHT ATRIUM           Index LA diam:        5.40 cm 2.68 cm/m   RA Area:     19.50 cm LA Vol (A2C):   55.6 ml 27.62 ml/m  RA Volume:   59.20 ml  29.41 ml/m LA Vol (A4C):   46.2 ml 22.95 ml/m LA Biplane Vol: 51.6 ml 25.63 ml/m  AORTIC VALVE                 PULMONIC VALVE AV Area (Vmax): 3.04 cm  PV Vmax:       0.80 m/s AV Vmax:        95.00 cm/s   PV  Peak grad:  2.6 mmHg AV Peak Grad:   3.6 mmHg LVOT Vmax:      92.00 cm/s LVOT Vmean:     49.100 cm/s LVOT VTI:       0.114 m  AORTA Ao Root diam: 3.30 cm Ao Asc diam:  3.10 cm MITRAL VALVE               TRICUSPID VALVE MV Area (PHT): 5.75 cm    TV Peak grad:   35.8 mmHg MV Decel Time: 132 msec    TV Vmax:        2.99 m/s MV E velocity: 80.60 cm/s                            SHUNTS                            Systemic VTI:  0.11 m                            Systemic Diam: 2.00 cm Neoma Laming Electronically signed by Neoma Laming Signature Date/Time: 11/22/2021/2:07:39 PM    Final    US SCROTUM W/DOPPLER  Result Date: 11/23/2021 CLINICAL DATA:  Scrotal swelling x3 months EXAM: SCROTAL ULTRASOUND DOPPLER ULTRASOUND OF THE TESTICLES TECHNIQUE: Complete ultrasound examination of the testicles, epididymis, and other scrotal structures was performed. Color and spectral Doppler ultrasound were also utilized to evaluate blood flow to the testicles. COMPARISON:  September 13, 2018 FINDINGS: Right testicle Measurements: 5.0 x 3.3 x 1.9 cm. No mass or microlithiasis visualized. Left testicle Measurements: 3.9 x 3.1 x 3.0 cm. No mass or microlithiasis visualized. Right epididymis:  Normal in size and appearance. Left epididymis: Normal in size. Cysts in the left epididymis measuring up to 1.2 cm, containing some internal echoes. Hydrocele: Large left hydrocele containing minimal internal debris, similar to prior ultrasound from 2019. Varicocele:  None visualized. Pulsed Doppler interrogation of both testes demonstrates normal low resistance arterial and venous waveforms bilaterally. IMPRESSION: 1. Unremarkable sonographic appearance of the bilateral testicles, no evidence torsion. 2. Large left hydrocele containing some minimal internal debris and left epididymal cysts are stable dating back to September 13, 2018. Electronically Signed   By: Dahlia Bailiff M.D.   On: 11/23/2021 10:52    Microbiology: Results for orders  placed or performed during the hospital encounter of 12/10/21  Resp Panel by RT-PCR (Flu A&B, Covid) Nasopharyngeal Swab     Status: None   Collection Time: 12/11/21  2:01 AM   Specimen: Nasopharyngeal Swab; Nasopharyngeal(NP) swabs in vial transport medium  Result Value Ref Range Status   SARS Coronavirus 2 by RT PCR NEGATIVE NEGATIVE Final    Comment: (NOTE) SARS-CoV-2 target nucleic acids are NOT DETECTED.  The SARS-CoV-2 RNA is generally detectable in upper respiratory specimens during the acute phase of infection. The lowest concentration of SARS-CoV-2 viral copies this assay can detect is 138 copies/mL. A negative result does not preclude SARS-Cov-2 infection and should not be used as the sole basis for treatment or other patient management decisions. A negative result may occur with  improper specimen collection/handling, submission of specimen other than nasopharyngeal swab, presence of viral mutation(s) within the areas targeted by this assay, and  inadequate number of viral copies(<138 copies/mL). A negative result must be combined with clinical observations, patient history, and epidemiological information. The expected result is Negative.  Fact Sheet for Patients:  EntrepreneurPulse.com.au  Fact Sheet for Healthcare Providers:  IncredibleEmployment.be  This test is no t yet approved or cleared by the [place] FDA and  has been authorized for detection and/or diagnosis of SARS-CoV-2 by FDA under an Emergency Use Authorization (EUA). This EUA will remain  in effect (meaning this test can be used) for the duration of the COVID-19 declaration under Section 564(b)(1) of the Act, 21 U.S.C.section 360bbb-3(b)(1), unless the authorization is terminated  or revoked sooner.       Influenza A by PCR NEGATIVE NEGATIVE Final   Influenza B by PCR NEGATIVE NEGATIVE Final    Comment: (NOTE) The Xpert Xpress SARS-CoV-2/FLU/RSV plus assay is  intended as an aid in the diagnosis of influenza from Nasopharyngeal swab specimens and should not be used as a sole basis for treatment. Nasal washings and aspirates are unacceptable for Xpert Xpress SARS-CoV-2/FLU/RSV testing.  Fact Sheet for Patients: EntrepreneurPulse.com.au  Fact Sheet for Healthcare Providers: IncredibleEmployment.be  This test is not yet approved or cleared by the [place] FDA and has been authorized for detection and/or diagnosis of SARS-CoV-2 by FDA under an Emergency Use Authorization (EUA). This EUA will remain in effect (meaning this test can be used) for the duration of the COVID-19 declaration under Section 564(b)(1) of the Act, 21 U.S.C. section 360bbb-3(b)(1), unless the authorization is terminated or revoked.  Performed at [facility], [address] [place], [place] [id number]     Labs: CBC: Recent Labs  Lab 12/10/21 1605 12/11/21 0503 12/13/21 0649  WBC 10.7* 11.8* 10.9*  HGB 14.5 13.5 14.3  HCT 45.9 42.5 43.5  MCV 85.3 83.7 82.7  PLT 305 250 093   Basic Metabolic Panel: Recent Labs  Lab 12/10/21 1605 12/10/21 1818 12/11/21 0503 12/13/21 0649  NA 136  --  136 138  K 5.4*  --  4.1 4.4  CL 108  --  107 102  CO2 21*  --  21* 26  GLUCOSE 176*  --  108* 112*  BUN 58*  --  54* 46*  CREATININE 2.49*  --  2.51* 2.72*  CALCIUM 9.4  --  9.3 9.4  MG  --  2.1  --   --    Liver Function Tests: No results for input(s): AST, ALT, ALKPHOS, BILITOT, PROT, ALBUMIN in the last 168 hours. CBG: No results for input(s): GLUCAP in the last 168 hours.  Discharge time spent: greater than 30 minutes.  Signed: Max Sane, MD [facility] 12/13/2021

## 2021-12-13 NOTE — Progress Notes (Signed)
Called patient son to make aware patient is ready to be discharged. No answer from son, message was left

## 2021-12-13 NOTE — TOC Transition Note (Signed)
Transition of Care [facility]) - CM/SW Discharge Note   Patient Details  Name: George Bolton MRN: [id number] Date of Birth: 12-26-1940  [facility]) CM/SW Contact:  Harriet Masson, RN Phone Number:[phone] 12/13/2021, 10:04 AM   Clinical Narrative:    Pt will be discharged today with the request for [place]. RN spoke with the spouse Corliss Skains and the son DeAndre. Arrangements with [facility]) for PT/OT/SW services however unable to accommodate services for about a week delayed. Team and son has been updated. Pt already has a RW and cane. No other request at this time. Bedside RN aware to call the son when pt is ready for discharge.   Final next level of care: [place] Barriers to Discharge: Barriers Resolved   Patient Goals and CMS Choice Patient states their goals for this hospitalization and ongoing recovery are:: to go home CMS Medicare.gov Compare Post Acute Care list provided to:: Patient Represenative (must comment) (son) Choice offered to / list presented to : Adult Children  Discharge Placement                  Name of family member notified: Burman Nieves (Son) Patient and family notified of of transfer: 12/13/21  Discharge Plan and Services                          HH Arranged: PT, OT, Social Work [employer] Agency: [place] ([place]) Date [place]: 12/13/21 Time [place]: 1683 Representative spoke with at [place]: [place] ([place]) Interventions     Readmission Risk Interventions No flowsheet data found.

## 2021-12-13 NOTE — Progress Notes (Signed)
George Bolton to be D/C'd Home per MD order.  Discussed prescriptions and follow up appointments with the patient. Prescriptions electronically submitted. medication list explained in detail. Pt and son verbalized understanding.  Allergies as of 12/13/2021       Reactions   Cortisone Anaphylaxis   Triamcinolone    Other reaction(s): Unknown        Medication List     STOP taking these medications    polyethylene glycol 17 g packet Commonly known as: MIRALAX / GLYCOLAX   traMADol 50 MG tablet Commonly known as: ULTRAM       TAKE these medications    (feeding supplement) PROSource Plus liquid Take 30 mLs by mouth 3 (three) times daily between meals.   acetaminophen 325 MG tablet Commonly known as: TYLENOL Take 2 tablets (650 mg total) by mouth every 6 (six) hours as needed for mild pain (or Fever >/= 101).   allopurinol 100 MG tablet Commonly known as: ZYLOPRIM Take 0.5 tablets (50 mg total) by mouth daily.   apixaban 2.5 MG Tabs tablet Commonly known as: ELIQUIS Take 2.5 mg by mouth 2 (two) times daily.   cloNIDine 0.2 MG tablet Commonly known as: CATAPRES Take 0.1 mg by mouth 2 (two) times daily.   colchicine 0.6 MG tablet Take 0.5 tablets (0.3 mg total) by mouth daily.   escitalopram 10 MG tablet Commonly known as: LEXAPRO Take 10 mg by mouth daily.   famotidine 10 MG tablet Commonly known as: PEPCID Take 1 tablet (10 mg total) by mouth daily.   furosemide 20 MG tablet Commonly known as: LASIX Take 1 tablet (20 mg total) by mouth daily. Hold till 12/16/21 and restart on 12/17/21 Start taking on: December 17, 2021 What changed:  additional instructions These instructions start on December 17, 2021. If you are unsure what to do until then, ask your doctor or other care provider.   hydrALAZINE 50 MG tablet Commonly known as: APRESOLINE Take 1 tablet (50 mg total) by mouth 4 (four) times daily. What changed:  medication strength how much to take    isosorbide mononitrate 30 MG 24 hr tablet Commonly known as: IMDUR Take 30 mg by mouth daily.   metoprolol tartrate 50 MG tablet Commonly known as: LOPRESSOR Take 1 tablet (50 mg total) by mouth 2 (two) times daily.   multivitamin with minerals Tabs tablet Take 1 tablet by mouth daily.   predniSONE 20 MG tablet Commonly known as: DELTASONE Take 20 mg by mouth daily with breakfast.   rosuvastatin 20 MG tablet Commonly known as: Crestor Take 1 tablet (20 mg total) by mouth daily.        Vitals:   12/13/21 0739 12/13/21 1226  BP: (!) 160/116 129/87  Pulse: 94 80  Resp: 16 16  Temp: 98.2 F (36.8 C) 98.1 F (36.7 C)  SpO2: 99% 99%    Skin clean, dry and intact without evidence of skin break down, no evidence of skin tears noted. IV catheter discontinued intact. Site without signs and symptoms of complications. Dressing and pressure applied. Pt denies pain at this time. No complaints noted.  An After Visit Summary was printed and given to the patient. Patient escorted via [place], and D/C home via private auto.  [place]

## 2021-12-22 ENCOUNTER — Ambulatory Visit: Payer: Medicare Other | Admitting: Family

## 2021-12-24 ENCOUNTER — Ambulatory Visit: Payer: Self-pay | Admitting: Neurology

## 2021-12-30 ENCOUNTER — Telehealth: Payer: Self-pay | Admitting: Nurse Practitioner

## 2021-12-30 ENCOUNTER — Encounter: Payer: Self-pay | Admitting: Family

## 2021-12-30 ENCOUNTER — Ambulatory Visit: Payer: Medicare Other | Attending: Family | Admitting: Family

## 2021-12-30 ENCOUNTER — Other Ambulatory Visit: Payer: Self-pay

## 2021-12-30 VITALS — BP 162/98 | HR 99 | Resp 16 | Ht 72.0 in | Wt 177.0 lb

## 2021-12-30 DIAGNOSIS — I5032 Chronic diastolic (congestive) heart failure: Secondary | ICD-10-CM

## 2021-12-30 DIAGNOSIS — M109 Gout, unspecified: Secondary | ICD-10-CM | POA: Diagnosis not present

## 2021-12-30 DIAGNOSIS — I130 Hypertensive heart and chronic kidney disease with heart failure and stage 1 through stage 4 chronic kidney disease, or unspecified chronic kidney disease: Secondary | ICD-10-CM | POA: Diagnosis not present

## 2021-12-30 DIAGNOSIS — I4821 Permanent atrial fibrillation: Secondary | ICD-10-CM | POA: Diagnosis not present

## 2021-12-30 DIAGNOSIS — I10 Essential (primary) hypertension: Secondary | ICD-10-CM

## 2021-12-30 DIAGNOSIS — I890 Lymphedema, not elsewhere classified: Secondary | ICD-10-CM | POA: Insufficient documentation

## 2021-12-30 DIAGNOSIS — Z7901 Long term (current) use of anticoagulants: Secondary | ICD-10-CM | POA: Insufficient documentation

## 2021-12-30 DIAGNOSIS — I4891 Unspecified atrial fibrillation: Secondary | ICD-10-CM | POA: Insufficient documentation

## 2021-12-30 MED ORDER — HYDRALAZINE HCL 100 MG PO TABS: 100.0000 mg | ORAL_TABLET | Freq: Three times a day (TID) | ORAL | 3 refills | Status: DC

## 2021-12-30 NOTE — Progress Notes (Signed)
Patient ID: George Bolton, male    DOB: May 02, 1941, 81 y.o.   MRN: [id number]  HPI  George Bolton is a 81 y/o male with a history of HTN, atrial fibrillation, gout and chronic heart failure.   Echo report from 11/22/21 reviewed and showed an EF of 60-65% along with moderate LVH/ LAE. Echo report from 03/03/18 reviewed and showed an EF of 60-65%.   Admitted 12/10/21 due to acute on chronic HF possibly caused by uncontrolled HTN. Initially given IV lasix with transition to oral diuretics. Elevated troponin thought to be due to demand ischemia. PT & hospice evaluations done.   Discharged after 3 days.   He presents today for a follow-up visit although hasn't been seen since January 2020. He presents with a chief complaint of moderate shortness of breath with little exertion. Describes this as chronic in nature having been present for years. He has associated fatigue, pedal edema and difficulty sleeping along with this. He denies any dizziness, chest pain, cough, palpitations or abdominal distention.   Says that he doesn't drink much fluids during the day because he'd "be in the bathroom all the time". Son says that nephrology told him to drink ~ 40 ounces of fluids daily. Not weighing daily.   Asking whether his heart could be shocked back into rhythm.   Past Medical History:  Diagnosis Date   Chronic atrial fibrillation ([place])    a. Afib dates back to at least 2002 when reviewing prior EKG with EKGs from 2014 onward showing Afib; b. CHADS2VASc at least 4 (CHF, HTN, age x 2)   Chronic diastolic CHF (congestive heart failure) ([place])    a. TTE 5/19: EF 70-75%, mod LVH, near complete obliteration of the mid and apical LV cavity (can be seen in apical hypertrophic CM), mildly dilated LA, RV cavity size nl, RV wall thickness nl, RVSF mildly reduced, mildly dilated RA; b. 02/2018 Limited echo w/ definity: EF 50-65%, no apical hypertrophic CM.   Gout    Hypertension    Past Surgical History:  Procedure  Laterality Date   CHOLECYSTECTOMY N/A 01/13/2017   Procedure: LAPAROSCOPIC CHOLECYSTECTOMY;  Surgeon: Jules Husbands, MD;  Location: [facility] ORS;  Service: General;  Laterality: N/A;   Family History  Problem Relation Age of Onset   Stroke Mother    Social History   Tobacco Use   Smoking status: Never   Smokeless tobacco: Never  Substance Use Topics   Alcohol use: No   Allergies  Allergen Reactions   Cortisone Anaphylaxis   Triamcinolone     Other reaction(s): Unknown   Past Medical History:  Diagnosis Date   Chronic atrial fibrillation ([place])    a. Afib dates back to at least 2002 when reviewing prior EKG with EKGs from 2014 onward showing Afib; b. CHADS2VASc at least 4 (CHF, HTN, age x 2)   Chronic diastolic CHF (congestive heart failure) ([place])    a. TTE 5/19: EF 70-75%, mod LVH, near complete obliteration of the mid and apical LV cavity (can be seen in apical hypertrophic CM), mildly dilated LA, RV cavity size nl, RV wall thickness nl, RVSF mildly reduced, mildly dilated RA; b. 02/2018 Limited echo w/ definity: EF 50-65%, no apical hypertrophic CM.   Gout    Hypertension    Past Surgical History:  Procedure Laterality Date   CHOLECYSTECTOMY N/A 01/13/2017   Procedure: LAPAROSCOPIC CHOLECYSTECTOMY;  Surgeon: Jules Husbands, MD;  Location: [facility] ORS;  Service: General;  Laterality: N/A;  Family History  Problem Relation Age of Onset   Stroke Mother    Social History   Tobacco Use   Smoking status: Never   Smokeless tobacco: Never  Substance Use Topics   Alcohol use: No   Allergies  Allergen Reactions   Cortisone Anaphylaxis   Triamcinolone     Other reaction(s): Unknown   Prior to Admission medications   Medication Sig Start Date End Date Taking? Authorizing Provider  acetaminophen (TYLENOL) 325 MG tablet Take 2 tablets (650 mg total) by mouth every 6 (six) hours as needed for mild pain (or Fever >/= 101). 11/28/21  Yes Wieting, Richard, MD  allopurinol (ZYLOPRIM) 100  MG tablet Take 0.5 tablets (50 mg total) by mouth daily. 11/28/21  Yes Wieting, Richard, MD  apixaban (ELIQUIS) 2.5 MG TABS tablet Take 2.5 mg by mouth 2 (two) times daily. 12/27/19  Yes [provider]  cloNIDine (CATAPRES) 0.2 MG tablet Take 0.1 mg by mouth 2 (two) times daily. 11/07/21  Yes [provider]  colchicine 0.6 MG tablet Take 0.5 tablets (0.3 mg total) by mouth daily. 11/29/21  Yes Wieting, Richard, MD  famotidine (PEPCID) 10 MG tablet Take 1 tablet (10 mg total) by mouth daily. 11/29/21  Yes Wieting, Richard, MD  furosemide (LASIX) 20 MG tablet Take 1 tablet (20 mg total) by mouth daily. Hold till 12/16/21 and restart on 12/17/21 12/17/21 01/16/22 Yes Max Sane, MD  isosorbide mononitrate (IMDUR) 30 MG 24 hr tablet Take 30 mg by mouth daily. 01/31/21  Yes [provider]  metoprolol tartrate (LOPRESSOR) 50 MG tablet Take 1 tablet (50 mg total) by mouth 2 (two) times daily. 11/28/21  Yes Wieting, Richard, MD  Multiple Vitamin (MULTIVITAMIN WITH MINERALS) TABS tablet Take 1 tablet by mouth daily. 11/29/21  Yes Wieting, Richard, MD  Nutritional Supplements (,FEEDING SUPPLEMENT, PROSOURCE PLUS) liquid Take 30 mLs by mouth 3 (three) times daily between meals. 11/28/21  Yes Wieting, Richard, MD  rosuvastatin (CRESTOR) 20 MG tablet Take 1 tablet (20 mg total) by mouth daily. 11/28/21  Yes Wieting, Richard, MD  hydrALAZINE (APRESOLINE) 50 MG tablet Take 1 tablet (50 mg total) by mouth 3 (three) times daily. 12/30/21 01/29/22  Alisa Graff, FNP   Review of Systems  Constitutional: Positive for malaise/fatigue. Negative for decreased appetite.  HENT:  Negative for congestion, hoarse voice and sore throat.   Eyes: Negative.   Cardiovascular:  Positive for dyspnea on exertion (easily) and leg swelling (L>R). Negative for chest pain and palpitations.  Respiratory:  Positive for shortness of breath. Negative for cough and wheezing.   Endocrine: Negative.   Hematologic/Lymphatic: Negative.    Skin: Negative.   Musculoskeletal:  Negative for back pain and joint pain.  Gastrointestinal:  Negative for bloating and abdominal pain.  Genitourinary: Negative.   Neurological:  Negative for dizziness, light-headedness and weakness.  Psychiatric/Behavioral:  Negative for depression. The patient has insomnia (didn't sleep good last night). The patient is not nervous/anxious.   Allergic/Immunologic: Negative.    Vitals:   12/30/21 1459 12/30/21 1512  BP: (!) 178/128 (!) 162/98  Pulse: 99   Resp: 16   SpO2: 99%   Weight: 177 lb (80.3 kg)   Height: 6' (1.829 m)    Wt Readings from Last 3 Encounters:  12/30/21 177 lb (80.3 kg)  12/13/21 173 lb 1 oz (78.5 kg)  11/21/21 175 lb (79.4 kg)   Lab Results  Component Value Date   CREATININE 2.72 (H) 12/13/2021   CREATININE 2.51 (  H) 12/11/2021   CREATININE 2.49 (H) 12/10/2021   Physical Exam Vitals and nursing note reviewed. Exam conducted with a chaperone present (son).  Constitutional:      Appearance: He is well-developed.  HENT:     Head: Normocephalic and atraumatic.  Neck:     Vascular: No JVD.  Cardiovascular:     Rate and Rhythm: Normal rate. Rhythm irregular.  Pulmonary:     Effort: Pulmonary effort is normal. No respiratory distress.     Breath sounds: No wheezing or rales.  Abdominal:     General: There is no distension.     Palpations: Abdomen is soft.  Musculoskeletal:        General: No tenderness.     Cervical back: Normal range of motion and neck supple.     Right lower leg: Edema (1+) present.     Left lower leg: Edema (2+) present.  Skin:    General: Skin is warm and dry.  Neurological:     Mental Status: He is alert and oriented to person, place, and time.  Psychiatric:        Behavior: Behavior normal.   Assessment & Plan:  1: Chronic heart failure with preserved ejection fraction with structural changes (LVH)- - NYHA class III - euvolemic today - not weighing daily but has scales; instructed to  weigh daily and to call for an overnight weight gain of >2 pounds or a weekly weight gain of >5 pounds - not adding salt; Reminded to keep daily sodium intake to 2000mg  a day  - consider GDMT of entresto/ SGLT2 depending on how renal function does - drinking ~ 22 ounces of fluids daily; nephrology said needs to drink 40 ounces; explained the importance of needing to drink the 40 ounces so that his diuretic can work properly - BNP 12/10/21 was 1650.5  2: HTN with CKD- - BP elevated  - will increase his hydralazine to 100mg  TID; can finish his current bottle by taking 2 tablets TID - [facility]) and saw them last week - saw nephrology Holley Raring) 12/10/21 - BMP 12/13/21 reviewed and showed sodium 138, potassium 4.4, creatinine 2.72 and GFR 23  3: Lymphedema- - stage 2 - son says that edema improves overnight after legs have been in the bed all night - patient says swelling has improved since recent admission  4: Atrial fibrillation- - saw cardiology Clayborn Bigness) 05/01/21 - advised to f/u with his office regarding possible cardioversion   Patient did not bring his medications nor a list. Each medication was verbally reviewed with the patient and he was encouraged to bring the bottles to every visit to confirm accuracy of list.   Return in 1 month, sooner if needed.

## 2021-12-30 NOTE — Patient Instructions (Addendum)
Begin weighing daily and call for an overnight weight gain of 3 pounds or more or a weekly weight gain of more than 5 pounds. ? ? ? ?Finish your current hydralazine dose by taking 2 tablets three times a day. When you finish this bottle and pick up the new 100mg  dose, you will resume taking 1 tablet three times a day.  ? ? ?Drink 40 ounces of liquid daily ? ? ?

## 2021-12-30 NOTE — Telephone Encounter (Signed)
Spoke with patient's son Deandre regarding the Palliative referral/services and he conferenced the patient in on the call and I rec'd verbal consent from the patient to start Palliative services with him.  I have scheduled a [place] for 01/06/22 @ 2:30 PM ?

## 2022-01-06 ENCOUNTER — Other Ambulatory Visit: Payer: Self-pay

## 2022-01-06 ENCOUNTER — Other Ambulatory Visit: Payer: Medicare Other | Admitting: Nurse Practitioner

## 2022-01-06 ENCOUNTER — Encounter: Payer: Self-pay | Admitting: Nurse Practitioner

## 2022-01-06 DIAGNOSIS — Z515 Encounter for palliative care: Secondary | ICD-10-CM

## 2022-01-06 DIAGNOSIS — R531 Weakness: Secondary | ICD-10-CM

## 2022-01-06 DIAGNOSIS — R0602 Shortness of breath: Secondary | ICD-10-CM

## 2022-01-06 NOTE — Progress Notes (Signed)
? ? ?Manufacturing engineer ?Community Palliative Care Consult Note ?Telephone: [phone]  ?Fax: [phone]  ? ?Date of encounter: 01/06/22 ?4:23 PM ?PATIENT NAME: George Bolton ?[place] [address]   ?[phone] (home)  ?DOB: 1941/08/02 ?MRN: [id number] ?PRIMARY CARE PROVIDER:    ?Denton Lank, MD,  ?[address] ?[place] [address] ?[phone] ? ?REFERRING PROVIDER:   ?Denton Lank, MD ?[address] ?[place],  [place] [address] ?[phone] ? ?RESPONSIBLE PARTY:    ?Contact Information   ? ? Name Relation Home Work Mobile  ? George Bolton Spouse [phone]    ? George Bolton Son   [phone]  ? ?  ? ?Due to the COVID-19 crisis, this visit was done via telemedicine from my office and it was initiated and consent by this patient and or family. ? ?I connected with George Bolton with AMALIO LOE OR PROXY on 01/06/22 by a telephone as video not available enabled telemedicine application and verified that I am speaking with the correct person. ?  ?I discussed the limitations of evaluation and management by telemedicine. The patient expressed understanding and agreed to proceed. Palliative Care was asked to follow this patient by consultation request of  Denton Lank, MD to address advance care planning and complex medical decision making. This is the initial visit.                           ?ASSESSMENT AND PLAN / RECOMMENDATIONS:  ?Symptom Management/Plan: ?1. Advance Care Planning; Further discussion with next visit with code status introduce MOST form. Talked about Hospice services under Medicare benefit. Will see how he does with the last 2 weeks of PT then revisit option of hospice if after hospice physician case review is eligible.  ? ?2. Goals of Care: Goals include to maximize quality of life and symptom management. Our advance care planning conversation included a discussion about:    ?The value and importance of advance care planning   ?Exploration of personal, cultural or spiritual beliefs that might influence medical decisions  ?Exploration of goals of care in the event of a sudden injury or illness  ?Identification and preparation of a healthcare agent  ?Review and updating or creation of an advance directive document. ? ?3. Shortness of breath, no O2, continues with exertion. We talked about medications, rest, endurance, monitoring respiratory status, edema ? ?4. Generalized weakness secondary to decompensation related to hospitalization, discussed therapy which George Bolton has been improving, continue with work with PT as about another 2 weeks left, will see how he does. Fall precautions.  ? ?5. Palliative care encounter; Palliative care encounter; Palliative medicine team will continue to support patient, patient's family, and medical team. Visit consisted of counseling and education dealing with the complex and emotionally intense issues of symptom management and palliative care in the setting of serious and potentially life-threatening illness ? ?Follow up Palliative Care Visit: Palliative care will continue to follow for complex medical decision making, advance care planning, and clarification of goals. Return 2 weeks or prn. ? ?I spent 61 minutes providing this consultation. More than 50% of the time in this consultation was spent in counseling and care coordination. ?PPS: 40% ? ?Chief Complaint: Initial palliative consult for complex medical decision making ? ?HISTORY OF PRESENT ILLNESS:  George Bolton is a 81 y.o. year old male  with multiple medical problems including CHF, afib, h/o CVA (11/22/2021), CKD, NSVT, HTN, OA knee. Hospitalized  from 12/10/2021 to 12/13/2021 for acute on chronic diastolic CHF. [name] discussion to be d/c home with hospice though George Bolton changed his mind and wished for therapy so d/c home with HH/PT. I called George Bolton, son with George and Mrs Bolton by telephone as video not available. We talked about the last  time George Bolton was independent. We talked about past medical history, recent hospitalization, ros, symptoms including shortness of breath, generalized weakness. We talked about functional abilities, ambulating with walker. We talked about work George Bolton has been doing with physical therapy which will soon come to an end in about 2 weeks. We talked about George Bolton requiring assistance to with ADL's bathing, dressing from son. We talked about appetite. We talked about George Bolton does live with his wife, though son comes often to help with needs. We talked about daily routine. Medical goals reviewed, Talked about role pc in poc. We talked about Hospice services under Medicare benefit. We talked about seeing how George Bolton does in 2 weeks when PT d/c and then further discussion of needs. George and Mrs Bolton with son in agreement. Therapeutic listening, emotional support provided. Questions answered.  ? ?History obtained from review of EMR, discussion with  George Bolton.  ?I reviewed available labs, medications, imaging, studies and related documents from the EMR.  Records reviewed and summarized above.  ? ?ROS ?10 point system reviewed all negative except HPI ? ?Physical Exam: ?deferred ?CURRENT PROBLEM LIST:  ?Patient Active Problem List  ? Diagnosis Date Noted  ? Left leg swelling   ? Goals of care, counseling/discussion 12/11/2021  ? CKD (chronic kidney disease) stage 4, GFR 15-29 ml/min (HCC) 12/10/2021  ? NSVT (nonsustained ventricular tachycardia) 12/10/2021  ? Type 2 diabetes mellitus with diabetic nephropathy ([place]) 12/10/2021  ? Hyperkalemia 12/10/2021  ? Elevated troponin 12/10/2021  ? Recent cerebrovascular accident 11/22/21 (CVA) 12/10/2021  ? Hypertensive emergency 12/10/2021  ? Osteoarthritis of knee 12/10/2021  ? Acute embolic stroke ([place]) 51/11/5850  ? Community acquired bilateral lower lobe pneumonia 11/23/2021  ? Metabolic acidemia 77/82/4235  ? Acute kidney injury superimposed on chronic kidney  disease ([place]) 11/22/2021  ? CKD (chronic kidney disease), stage III ([place]) 05/21/2021  ? Acute on chronic diastolic CHF (congestive heart failure) ([place]) 12/01/2018  ? Atrial fibrillation ([place]) 03/15/2018  ? Lymphedema 03/15/2018  ? Gout 01/25/2017  ? Congestive heart failure ([place]) 04/25/2015  ? Hypertension 04/25/2015  ? ?PAST MEDICAL HISTORY:  ?Active Ambulatory Problems  ?  Diagnosis Date Noted  ? Congestive heart failure ([place]) 04/25/2015  ? Gout 01/25/2017  ? Hypertension 04/25/2015  ? Atrial fibrillation ([place]) 03/15/2018  ? Lymphedema 03/15/2018  ? Acute on chronic diastolic CHF (congestive heart failure) ([place]) 12/01/2018  ? CKD (chronic kidney disease), stage III ([place]) 05/21/2021  ? Acute kidney injury superimposed on chronic kidney disease ([place]) 11/22/2021  ? Community acquired bilateral lower lobe pneumonia 11/23/2021  ? Metabolic acidemia 36/14/4315  ? Acute embolic stroke ([place]) 40/05/6760  ? CKD (chronic kidney disease) stage 4, GFR 15-29 ml/min (HCC) 12/10/2021  ? NSVT (nonsustained ventricular tachycardia) 12/10/2021  ? Type 2 diabetes mellitus with diabetic nephropathy ([place]) 12/10/2021  ? Hyperkalemia 12/10/2021  ? Elevated troponin 12/10/2021  ? Recent cerebrovascular accident 11/22/21 (CVA) 12/10/2021  ? Hypertensive emergency 12/10/2021  ? Osteoarthritis of knee 12/10/2021  ? Goals of care, counseling/discussion 12/11/2021  ? Left leg swelling   ? ?Resolved Ambulatory Problems  ?  Diagnosis Date Noted  ? Cholecystitis   ? Acute cholecystitis  01/11/2017  ? Sepsis ([place]) 04/21/2018  ? Atrial fibrillation with RVR ([place]) 05/21/2021  ? ?Past Medical History:  ?Diagnosis Date  ? Chronic atrial fibrillation (HCC)   ? Chronic diastolic CHF (congestive heart failure) ([place])   ? ?SOCIAL HX:  ?Social History  ? ?Tobacco Use  ? Smoking status: Never  ? Smokeless tobacco: Never  ?Substance Use Topics  ? Alcohol use: No  ? ?FAMILY HX:  ?Family History  ?Problem Relation Age of Onset  ? Stroke Mother   ?   ? ?ALLERGIES:   ?Allergies  ?Allergen Reactions  ? Cortisone Anaphylaxis  ? Triamcinolone   ?  Other reaction(s): Unknown  ?   ?PERTINENT MEDICATIONS:  ?Outpatient Encounter Medications as of 01/06/2022  ?Medication Sig  ?

## 2022-01-28 ENCOUNTER — Encounter: Payer: Medicare Other | Admitting: Nurse Practitioner

## 2022-01-28 ENCOUNTER — Telehealth: Payer: Self-pay | Admitting: Nurse Practitioner

## 2022-01-28 NOTE — Telephone Encounter (Signed)
I attempted to contact Mr Jasinski to confirm PC in person visit with no answer, message left with contact information and no return call. Will ask to be put on reschedule list.  ?

## 2022-02-03 ENCOUNTER — Ambulatory Visit: Payer: Medicare Other | Admitting: Family

## 2022-02-03 ENCOUNTER — Telehealth: Payer: Self-pay | Admitting: Nurse Practitioner

## 2022-02-03 NOTE — Telephone Encounter (Signed)
I called George George Bolton to reschedule PC f/u visit, George. Bolton endorses he wishes to continue PC but would like a call back in 4 weeks to reschedule as he has multiple appointments with month for him and his mom.  ?

## 2022-02-25 NOTE — Progress Notes (Signed)
This encounter was created in error - please disregard.

## 2022-04-02 ENCOUNTER — Other Ambulatory Visit: Payer: Self-pay | Admitting: Family

## 2022-04-02 MED ORDER — HYDRALAZINE HCL 100 MG PO TABS: 100.0000 mg | ORAL_TABLET | Freq: Three times a day (TID) | ORAL | 5 refills | Status: DC

## 2022-04-02 NOTE — Progress Notes (Signed)
Refilled hydralazine

## 2022-05-14 ENCOUNTER — Other Ambulatory Visit: Payer: Medicare Other

## 2022-05-14 DIAGNOSIS — Z515 Encounter for palliative care: Secondary | ICD-10-CM

## 2022-05-14 NOTE — Progress Notes (Signed)
COMMUNITY PALLIATIVE CARE SW NOTE  PATIENT NAME: George Bolton DOB: Jun 24, 1941 MRN: [id number]  PRIMARY CARE PROVIDER: Denton Lank, MD  RESPONSIBLE PARTY:  Acct ID - Guarantor Home Phone Work Phone Relationship Acct Type  [id number] [name], [name] [phone]  Self P/F     [address] [place], [place] [address]    Palliative care SW outreached patient to complete telephonic visit.   Patient's spouse provided update on medical condition and/or changes.  Spouse endorses that patient is doing well patient is doing well. No change or declines since last PC check in.  Fall:  patient had a fall on Mon 05/11/22 with a head trauma with bleeding and a f/u doctors appt yesterday, no interventions provided.   Home health: Patient receiving PT with [facility]. Attmepting to get OT in place with the focus of transferring.   Hospitalizations: None recently   Appetite: remains fair. No significant weight changes noted.      Psychosocial assessment: No financial, food insecurities or issues with transportation noted. No other psychosocial needs. No S/S of depression or anxiety noted.   In home support: Patient no longer has previous private caregiver in place. Patient now has 16 hours of caregiver support provided by [facility].   In home PC visit scheduled with PC NP for 06/11/22 @ 1pm.   Palliative care will continue to monitor and assist with long term care planning as needed.  SOCIAL HX:  Social History   Tobacco Use   Smoking status: Never   Smokeless tobacco: Never  Substance Use Topics   Alcohol use: No        George Bolton, George Bolton

## 2022-05-29 ENCOUNTER — Ambulatory Visit: Payer: Medicare Other | Admitting: Physician Assistant

## 2022-05-29 ENCOUNTER — Encounter: Payer: Self-pay | Admitting: Physician Assistant

## 2022-05-29 VITALS — BP 178/123 | HR 85 | Ht 72.0 in | Wt 168.0 lb

## 2022-05-29 DIAGNOSIS — N433 Hydrocele, unspecified: Secondary | ICD-10-CM | POA: Diagnosis not present

## 2022-05-29 DIAGNOSIS — N200 Calculus of kidney: Secondary | ICD-10-CM

## 2022-05-29 DIAGNOSIS — N401 Enlarged prostate with lower urinary tract symptoms: Secondary | ICD-10-CM | POA: Diagnosis not present

## 2022-05-29 DIAGNOSIS — N138 Other obstructive and reflux uropathy: Secondary | ICD-10-CM | POA: Diagnosis not present

## 2022-05-29 DIAGNOSIS — N281 Cyst of kidney, acquired: Secondary | ICD-10-CM

## 2022-05-29 MED ORDER — FINASTERIDE 5 MG PO TABS: 5.0000 mg | ORAL_TABLET | Freq: Every day | ORAL | 11 refills | Status: DC

## 2022-05-29 MED ORDER — FINASTERIDE 5 MG PO TABS: 5.0000 mg | ORAL_TABLET | Freq: Every day | ORAL | 11 refills | Status: AC

## 2022-06-01 LAB — BLADDER SCAN AMB NON-IMAGING: Scan Result: 44 mL

## 2022-06-01 NOTE — Progress Notes (Signed)
05/29/2022 1:03 PM   George Bolton 1941-07-18 [id number]  CC: Chief Complaint  Patient presents with   Follow-up   Dysuria   HPI: George Bolton is a 81 y.o. male with PMH BPH with prostatomegaly previously on tamsulosin and finasteride, complex renal cyst, hydrocele, nephrolithiasis, and stably elevated PSA who discontinued screening in 2020 who presents today for evaluation of difficulty voiding.  He is accompanied today by his son, who contributes to HPI.  Today he reports several months of difficulty voiding with both hesitancy and straining.  He denies acute flank pain, dysuria, or gross hematuria.  He is rather frail appearing and his son reports he has had multiple falls.  Scrotal ultrasound with Doppler dated 11/23/2021 redemonstrates a large left hydrocele.  Renal ultrasound dated 11/22/2021 redemonstrates bilateral renal cysts without hydronephrosis or shadowing stones.  Bladder scan today with 44 mL.  He is unable to provide a sample.  PMH: Past Medical History:  Diagnosis Date   Chronic atrial fibrillation ([place])    a. Afib dates back to at least 2002 when reviewing prior EKG with EKGs from 2014 onward showing Afib; b. CHADS2VASc at least 4 (CHF, HTN, age x 2)   Chronic diastolic CHF (congestive heart failure) ([place])    a. TTE 5/19: EF 70-75%, mod LVH, near complete obliteration of the mid and apical LV cavity (can be seen in apical hypertrophic CM), mildly dilated LA, RV cavity size nl, RV wall thickness nl, RVSF mildly reduced, mildly dilated RA; b. 02/2018 Limited echo w/ definity: EF 50-65%, no apical hypertrophic CM.   Gout    Hypertension     Surgical History: Past Surgical History:  Procedure Laterality Date   CHOLECYSTECTOMY N/A 01/13/2017   Procedure: LAPAROSCOPIC CHOLECYSTECTOMY;  Surgeon: Jules Husbands, MD;  Location: [facility] ORS;  Service: General;  Laterality: N/A;    Home Medications:  Allergies as of 05/29/2022       Reactions   Cortisone Anaphylaxis    Triamcinolone    Other reaction(s): Unknown        Medication List        Accurate as of May 29, 2022 11:59 PM. If you have any questions, ask your nurse or doctor.          (feeding supplement) PROSource Plus liquid Take 30 mLs by mouth 3 (three) times daily between meals.   acetaminophen 325 MG tablet Commonly known as: TYLENOL Take 2 tablets (650 mg total) by mouth every 6 (six) hours as needed for mild pain (or Fever >/= 101).   allopurinol 100 MG tablet Commonly known as: ZYLOPRIM Take 0.5 tablets (50 mg total) by mouth daily.   amoxicillin 500 MG tablet Commonly known as: AMOXIL Take 500 mg by mouth 2 (two) times daily.   apixaban 2.5 MG Tabs tablet Commonly known as: ELIQUIS Take 2.5 mg by mouth 2 (two) times daily.   cloNIDine 0.2 MG tablet Commonly known as: CATAPRES Take 0.1 mg by mouth 2 (two) times daily. What changed: Another medication with the same name was removed. Continue taking this medication, and follow the directions you see here. Changed by: Debroah Loop, PA-C   colchicine 0.6 MG tablet Take 0.5 tablets (0.3 mg total) by mouth daily.   famotidine 20 MG tablet Commonly known as: PEPCID Take 20 mg by mouth 2 (two) times daily as needed. What changed: Another medication with the same name was removed. Continue taking this medication, and follow the directions you see here. Changed by:  Chalon Zobrist, PA-C   finasteride 5 MG tablet Commonly known as: PROSCAR Take 1 tablet (5 mg total) by mouth daily. Started by: Debroah Loop, PA-C   furosemide 20 MG tablet Commonly known as: LASIX Take 1 tablet (20 mg total) by mouth daily. Hold till 12/16/21 and restart on 12/17/21   hydrALAZINE 100 MG tablet Commonly known as: APRESOLINE Take 1 tablet (100 mg total) by mouth 3 (three) times daily.   isosorbide mononitrate 30 MG 24 hr tablet Commonly known as: IMDUR Take 30 mg by mouth daily.   metoprolol tartrate 50 MG  tablet Commonly known as: LOPRESSOR Take 1 tablet (50 mg total) by mouth 2 (two) times daily.   multivitamin with minerals Tabs tablet Take 1 tablet by mouth daily.   nystatin cream Commonly known as: MYCOSTATIN Apply topically 4 (four) times daily.   rosuvastatin 20 MG tablet Commonly known as: Crestor Take 1 tablet (20 mg total) by mouth daily.        Allergies:  Allergies  Allergen Reactions   Cortisone Anaphylaxis   Triamcinolone     Other reaction(s): Unknown    Family History: Family History  Problem Relation Age of Onset   Stroke Mother     Social History:   reports that he has never smoked. He has never used smokeless tobacco. He reports that he does not drink alcohol and does not use drugs.  Physical Exam: BP (!) 178/123 (BP Location: Left Arm, Patient Position: Sitting, Cuff Size: Normal)   Pulse 85   Ht 6' (1.829 m)   Wt 168 lb (76.2 kg)   BMI 22.78 kg/m   Constitutional:  Alert and oriented, no acute distress, nontoxic appearing HEENT: [place], AT Cardiovascular: No clubbing, cyanosis, or edema Respiratory: Normal respiratory effort, no increased work of breathing Skin: No rashes, bruises or suspicious lesions Neurologic: Grossly intact, no focal deficits, moving all 4 extremities Psychiatric: Normal mood and affect  Laboratory Data: Results for orders placed or performed in visit on 05/29/22  BLADDER SCAN AMB NON-IMAGING  Result Value Ref Range   Scan Result 44 mL   Assessment & Plan:   1. BPH with obstruction/lower urinary tract symptoms Worsening voiding symptoms off pharmacotherapy.  He is frail-appearing and does not wish to pursue surgical intervention, which is reasonable.  With his recent falls, I am hesitant to start him on an alpha-blocker.  He continues to empty well, so I recommended starting finasteride to reduce his prostate volume over the next several months.  He and his son are in agreement with this plan.  We will proceed with  symptom recheck in 6 months. - BLADDER SCAN AMB NON-IMAGING - finasteride (PROSCAR) 5 MG tablet; Take 1 tablet (5 mg total) by mouth daily.  Dispense: 30 tablet; Refill: 11  2. Renal cyst Stable, recommend no further monitoring for these.  3. Kidney stones Asymptomatic, no hydronephrosis on recent renal ultrasound.  We will continue to monitor.  4. Hydrocele, unspecified hydrocele type Stable.  We discussed various options including surveillance versus hydrocelectomy versus aspiration, but we discussed the rate of recurrence for aspiration versus hydrocelectomy.  Together, we elected to proceed with surveillance.  Return in about 6 months (around 11/29/2022) for BPH f/u with IPSS, UA, PVR.  Debroah Loop, PA-C  [facility] [address], [place], [place] [address] [phone]

## 2022-06-16 ENCOUNTER — Encounter: Payer: Self-pay | Admitting: Nurse Practitioner

## 2022-06-16 ENCOUNTER — Ambulatory Visit: Payer: Medicare Other | Admitting: Nurse Practitioner

## 2022-06-16 DIAGNOSIS — Z7189 Other specified counseling: Secondary | ICD-10-CM

## 2022-06-16 DIAGNOSIS — R0602 Shortness of breath: Secondary | ICD-10-CM

## 2022-06-16 DIAGNOSIS — Z515 Encounter for palliative care: Secondary | ICD-10-CM

## 2022-06-16 DIAGNOSIS — R630 Anorexia: Secondary | ICD-10-CM

## 2022-06-16 NOTE — Progress Notes (Signed)
[facility] Palliative Care Consult Note Telephone: [phone]  Fax: [phone]    Date of encounter: 06/16/22 4:17 PM PATIENT NAME: George Bolton [address] [place] [address]   [id number] (home)  DOB: 12-13-1940 MRN: [id number] PRIMARY CARE PROVIDER:    Denton Lank, MD,  [place]. [address] [place] [address] [phone]  REFERRING PROVIDER:   Denton Lank, MD [address] [place],   [address] [phone]  RESPONSIBLE PARTY:    Contact Information     Name Relation Home Work Mobile   [place] [phone]     jolly, bleicher   [phone]       Due to the COVID-19 crisis, this visit was done via telemedicine from my office and it was initiated and consent by this patient and or family.   I connected with George Bolton with DIANNE BADY OR PROXY on 01/06/22 by a telephone as video not available enabled telemedicine application and verified that I am speaking with the correct person.   I discussed the limitations of evaluation and management by telemedicine. The patient expressed understanding and agreed to proceed. Palliative Care was asked to follow this patient by consultation request of  George Lank, MD to address advance care planning and complex medical decision making. This is the initial visit.                           ASSESSMENT AND PLAN / RECOMMENDATIONS:  Symptom Management/Plan: 1. Advance Care Planning; Ongoing discussions   2. Goals of Care: reviewed; Goals include to maximize quality of life and symptom management. Our advance care planning conversation included a discussion about:    The value and importance of advance care planning  Exploration of personal, cultural or spiritual beliefs that might influence medical decisions  Exploration of goals of care in the event of a sudden injury or illness  Identification and preparation of a healthcare agent   Review and updating or creation of an advance directive document.   3. Shortness of breath, symptomatic able to walk 5 feet then needs to stop to rest; goes to CHF clinic. no O2, continues with exertion. We talked about medications, rest, endurance, monitoring respiratory status, edema, revisited hospice with son. Son endorses his mother and sister were not interested, agree to continue PC. Asked about Passenger transport manager and George Bolton endorses he was interested, message to [facility] NP/CHF clinic.    4. anorexia, ongoing, discussed with George Bolton son, food George Bolton eats, nutritional education done, weights, supplements.  04/10/2020 weight 199 lbs 05/01/2021 weight 190 lbs 05/29/2022 weight 168 lbs at Urology clinic 06/15/2022 weight 190 lbs at CHF clinic 5. Palliative care encounter; Palliative care encounter; Palliative medicine team will continue to support patient, patient's family, and medical team. Visit consisted of counseling and education dealing with the complex and emotionally intense issues of symptom management and palliative care in the setting of serious and potentially life-threatening illness   Follow up Palliative Care Visit: Palliative care will continue to follow for complex medical decision making, advance care planning, and clarification of goals. Return 2 weeks or prn.   I spent 35 minutes providing this consultation. More than 50% of the time in this consultation was spent in counseling and care coordination. PPS: 40%   Chief Complaint: Initial palliative consult for complex medical decision making   HISTORY OF PRESENT ILLNESS:  George Bolton is  a 81 y.o. year old male  with multiple medical problems including CHF, afib, h/o CVA (11/22/2021), CKD, NSVT, HTN, OA knee. Hospitalized from 12/10/2021 to 12/13/2021 for acute on chronic diastolic CHF. Hasty discussion to be d/c home with hospice though George Bolton changed his mind and wished for therapy so d/c home with HH/PT.  I called George Bolton, son with George Bolton by telephone as video not available. We talked about ros, symptoms including shortness of breath, generalized weakness. We talked about functional abilities, ambulating with walker is limited to about 5 steps without having to sit and rest. We talked about George. Bolton requiring assistance to with ADL's bathing, dressing from son. We talked about appetite, weight loss, discrepancies in weights from Urology and Cardiology office. We talked about daily routine. Medical goals reviewed, Talked about role pc in poc. We talked about Hospice services under Medicare benefit which we talked about on the 12/2021 pc visit. George Bolton son endorses he wanted to move forward with hospice but his mom and sister declined it. We talked about what services are provided, offered an informational session though George. Bolton declined. We talked about challenges George Bolton is facing. We talked about role pc in poc, supportive care, managing symptoms. George Bolton with son in agreement. Therapeutic listening, emotional support provided. Questions answered.    History obtained from review of EMR, discussion with  George. Bolton.  I reviewed available labs, medications, imaging, studies and related documents from the EMR.  Records reviewed and summarized above.    ROS 10 point system reviewed all negative except HPI   Physical Exam: deferred  Thank you for the opportunity to participate in the care of George. Bolton.  The palliative care team will continue to follow. Please call our office at [phone] if we can be of additional assistance.   Sherol Sabas Ihor Gully, NP

## 2022-06-17 ENCOUNTER — Telehealth: Payer: Self-pay | Admitting: Family

## 2022-06-17 NOTE — Telephone Encounter (Signed)
LVM with patient in attempt to get him scheduled for a follow up appointment after he was a no show to his previous appointment in April with [place].    Marilyn Wing, NT

## 2022-06-19 ENCOUNTER — Telehealth: Payer: Self-pay

## 2022-06-19 NOTE — Telephone Encounter (Signed)
12 pm.  Phone call made to George Bolton to schedule a home visit.  Visit scheduled for 9/19 @ 1 pm.

## 2022-07-14 ENCOUNTER — Other Ambulatory Visit: Payer: Medicare Other

## 2022-07-14 VITALS — BP 154/80 | HR 84 | Temp 97.6°F

## 2022-07-14 DIAGNOSIS — Z515 Encounter for palliative care: Secondary | ICD-10-CM

## 2022-07-14 NOTE — Progress Notes (Signed)
PATIENT NAME: George Bolton DOB: Jan 08, 1941 MRN: [id number]  PRIMARY CARE PROVIDER: Denton Lank, MD  RESPONSIBLE PARTY:  Acct ID - Guarantor Home Phone Work Phone Relationship Acct Type  [id number] CARLEE, TESFAYE* [phone]  Self P/F     [address] [place], [place] [address]   ACP: Patient endorses a DNR status.  Form in the home.  MOST form completed by [facility].  Forms uploaded into [employer].   Appetite:  Patient endorses eating 2 meals (breakfast/dinner) a day and eating snacks in between.  Patient endorses weights range between 160-170 lbs.   Dyspnea:  Patient denies issues with shortness of breath.  No complaints of chest pain.   Functional Status:  Patient is able to do his own ADL's.  He son is assisting with showers due to safety concerns.  Grab bars are present in the shower.  Patient is working with therapy at the [facility] program.  A shower chair has been ordered and is expected delivery is in 2 weeks.  Using a rolling walker for safe ambulation.   [facility]:  Patient is now followed by the [facility] in [place].  Wife endorses they have been active for about 2 weeks now and go on Monday/Wednesday.    Safety:  Patient endorses a fall last week.  No injuries or soreness reported.  No life alert in place at this time but wife endorses [facility] is working to get them an system in place.   Update provided to Christin Gusler, NP.    HISTORY OF PRESENT ILLNESS:  81 year old male with heart failure.  Patient is followed by Palliative Care every 4-8 weeks and PRN.  CODE STATUS: DNR-form in the home.  ADVANCED DIRECTIVES: No MOST FORM: Yes PPS: 50%   PHYSICAL EXAM:   VITALS: Today's Vitals   07/14/22 1302  BP: (!) 154/80  Pulse: 84  Temp: 97.6 F (36.4 C)  SpO2: 96%  PainSc: 0-No pain           Lorenza Burton, RN

## 2022-07-15 ENCOUNTER — Emergency Department: Payer: Medicare (Managed Care)

## 2022-07-15 ENCOUNTER — Encounter: Payer: Self-pay | Admitting: Emergency Medicine

## 2022-07-15 ENCOUNTER — Telehealth: Payer: Self-pay

## 2022-07-15 DIAGNOSIS — Z66 Do not resuscitate: Secondary | ICD-10-CM | POA: Diagnosis present

## 2022-07-15 DIAGNOSIS — N184 Chronic kidney disease, stage 4 (severe): Secondary | ICD-10-CM | POA: Diagnosis present

## 2022-07-15 DIAGNOSIS — E785 Hyperlipidemia, unspecified: Secondary | ICD-10-CM | POA: Diagnosis present

## 2022-07-15 DIAGNOSIS — I160 Hypertensive urgency: Secondary | ICD-10-CM | POA: Diagnosis present

## 2022-07-15 DIAGNOSIS — Z823 Family history of stroke: Secondary | ICD-10-CM

## 2022-07-15 DIAGNOSIS — D696 Thrombocytopenia, unspecified: Secondary | ICD-10-CM | POA: Diagnosis present

## 2022-07-15 DIAGNOSIS — Z7901 Long term (current) use of anticoagulants: Secondary | ICD-10-CM

## 2022-07-15 DIAGNOSIS — I130 Hypertensive heart and chronic kidney disease with heart failure and stage 1 through stage 4 chronic kidney disease, or unspecified chronic kidney disease: Secondary | ICD-10-CM | POA: Diagnosis not present

## 2022-07-15 DIAGNOSIS — Z9049 Acquired absence of other specified parts of digestive tract: Secondary | ICD-10-CM

## 2022-07-15 DIAGNOSIS — I482 Chronic atrial fibrillation, unspecified: Secondary | ICD-10-CM | POA: Diagnosis present

## 2022-07-15 DIAGNOSIS — I5033 Acute on chronic diastolic (congestive) heart failure: Secondary | ICD-10-CM | POA: Diagnosis present

## 2022-07-15 DIAGNOSIS — Z888 Allergy status to other drugs, medicaments and biological substances status: Secondary | ICD-10-CM

## 2022-07-15 DIAGNOSIS — Z79899 Other long term (current) drug therapy: Secondary | ICD-10-CM

## 2022-07-15 DIAGNOSIS — M109 Gout, unspecified: Secondary | ICD-10-CM | POA: Diagnosis present

## 2022-07-15 DIAGNOSIS — Z91148 Patient's other noncompliance with medication regimen for other reason: Secondary | ICD-10-CM

## 2022-07-15 DIAGNOSIS — I248 Other forms of acute ischemic heart disease: Secondary | ICD-10-CM | POA: Diagnosis present

## 2022-07-15 DIAGNOSIS — Z20822 Contact with and (suspected) exposure to covid-19: Secondary | ICD-10-CM | POA: Diagnosis present

## 2022-07-15 DIAGNOSIS — I161 Hypertensive emergency: Secondary | ICD-10-CM | POA: Diagnosis present

## 2022-07-15 DIAGNOSIS — R0602 Shortness of breath: Secondary | ICD-10-CM | POA: Diagnosis not present

## 2022-07-15 LAB — BASIC METABOLIC PANEL
Anion gap: 8 (ref 5–15)
BUN: 42 mg/dL — ABNORMAL HIGH (ref 8–23)
CO2: 22 mmol/L (ref 22–32)
Calcium: 9.9 mg/dL (ref 8.9–10.3)
Chloride: 113 mmol/L — ABNORMAL HIGH (ref 98–111)
Creatinine, Ser: 2.31 mg/dL — ABNORMAL HIGH (ref 0.61–1.24)
GFR, Estimated: 27 mL/min — ABNORMAL LOW (ref >60)
Glucose, Bld: 129 mg/dL — ABNORMAL HIGH (ref 70–99)
Potassium: 4.7 mmol/L (ref 3.5–5.1)
Sodium: 143 mmol/L (ref 135–145)

## 2022-07-15 LAB — TROPONIN I (HIGH SENSITIVITY)
Troponin I (High Sensitivity): 26 ng/L — ABNORMAL HIGH (ref <18)
Troponin I (High Sensitivity): 29 ng/L — ABNORMAL HIGH (ref <18)

## 2022-07-15 LAB — CBC
HCT: 43.8 % (ref 39.0–52.0)
Hemoglobin: 14 g/dL (ref 13.0–17.0)
MCH: 29.9 pg (ref 26.0–34.0)
MCHC: 32 g/dL (ref 30.0–36.0)
MCV: 93.6 fL (ref 80.0–100.0)
Platelets: 161 10*3/uL (ref 150–400)
RBC: 4.68 MIL/uL (ref 4.22–5.81)
RDW: 16.1 % — ABNORMAL HIGH (ref 11.5–15.5)
WBC: 8 10*3/uL (ref 4.0–10.5)
nRBC: 0 % (ref 0.0–0.2)

## 2022-07-15 NOTE — Telephone Encounter (Signed)
140 pm.  Phone call made to patient to advise of discharge from Palliative Care services.   Patient is currently under the [facility] program and is unable to have both services.  Message left for patient and wife.

## 2022-07-15 NOTE — ED Triage Notes (Signed)
Pt presents via EMS from home with complaints of HTN - states he is complaint with his BP meds but things "aint working". Pt is poor historian. Denies Headache, CP or SOB.     VS with EMS 200/144 95 96%

## 2022-07-16 ENCOUNTER — Inpatient Hospital Stay
Admission: EM | Admit: 2022-07-16 | Discharge: 2022-07-17 | DRG: 291 | Disposition: A | Discharge status: Home / Self Care | Payer: Medicare (Managed Care) | Attending: Internal Medicine | Admitting: Internal Medicine

## 2022-07-16 DIAGNOSIS — Z79899 Other long term (current) drug therapy: Secondary | ICD-10-CM | POA: Diagnosis not present

## 2022-07-16 DIAGNOSIS — E785 Hyperlipidemia, unspecified: Secondary | ICD-10-CM | POA: Diagnosis present

## 2022-07-16 DIAGNOSIS — R0602 Shortness of breath: Secondary | ICD-10-CM

## 2022-07-16 DIAGNOSIS — I161 Hypertensive emergency: Secondary | ICD-10-CM | POA: Diagnosis present

## 2022-07-16 DIAGNOSIS — D696 Thrombocytopenia, unspecified: Secondary | ICD-10-CM | POA: Diagnosis present

## 2022-07-16 DIAGNOSIS — Z20822 Contact with and (suspected) exposure to covid-19: Secondary | ICD-10-CM | POA: Diagnosis present

## 2022-07-16 DIAGNOSIS — I5033 Acute on chronic diastolic (congestive) heart failure: Secondary | ICD-10-CM | POA: Diagnosis present

## 2022-07-16 DIAGNOSIS — Z823 Family history of stroke: Secondary | ICD-10-CM | POA: Diagnosis not present

## 2022-07-16 DIAGNOSIS — I160 Hypertensive urgency: Secondary | ICD-10-CM | POA: Diagnosis present

## 2022-07-16 DIAGNOSIS — Z888 Allergy status to other drugs, medicaments and biological substances status: Secondary | ICD-10-CM | POA: Diagnosis not present

## 2022-07-16 DIAGNOSIS — Z9049 Acquired absence of other specified parts of digestive tract: Secondary | ICD-10-CM | POA: Diagnosis not present

## 2022-07-16 DIAGNOSIS — I130 Hypertensive heart and chronic kidney disease with heart failure and stage 1 through stage 4 chronic kidney disease, or unspecified chronic kidney disease: Secondary | ICD-10-CM | POA: Diagnosis present

## 2022-07-16 DIAGNOSIS — Z66 Do not resuscitate: Secondary | ICD-10-CM | POA: Diagnosis present

## 2022-07-16 DIAGNOSIS — I482 Chronic atrial fibrillation, unspecified: Secondary | ICD-10-CM | POA: Diagnosis present

## 2022-07-16 DIAGNOSIS — E1169 Type 2 diabetes mellitus with other specified complication: Secondary | ICD-10-CM

## 2022-07-16 DIAGNOSIS — I10 Essential (primary) hypertension: Secondary | ICD-10-CM

## 2022-07-16 DIAGNOSIS — Z91148 Patient's other noncompliance with medication regimen for other reason: Secondary | ICD-10-CM | POA: Diagnosis not present

## 2022-07-16 DIAGNOSIS — N184 Chronic kidney disease, stage 4 (severe): Secondary | ICD-10-CM | POA: Diagnosis present

## 2022-07-16 DIAGNOSIS — Z7901 Long term (current) use of anticoagulants: Secondary | ICD-10-CM | POA: Diagnosis not present

## 2022-07-16 DIAGNOSIS — M109 Gout, unspecified: Secondary | ICD-10-CM | POA: Diagnosis present

## 2022-07-16 DIAGNOSIS — I248 Other forms of acute ischemic heart disease: Secondary | ICD-10-CM | POA: Diagnosis present

## 2022-07-16 HISTORY — DX: Hypertensive urgency: I16.0

## 2022-07-16 LAB — BASIC METABOLIC PANEL
Anion gap: 7 (ref 5–15)
BUN: 37 mg/dL — ABNORMAL HIGH (ref 8–23)
CO2: 21 mmol/L — ABNORMAL LOW (ref 22–32)
Calcium: 9.6 mg/dL (ref 8.9–10.3)
Chloride: 115 mmol/L — ABNORMAL HIGH (ref 98–111)
Creatinine, Ser: 2.19 mg/dL — ABNORMAL HIGH (ref 0.61–1.24)
GFR, Estimated: 29 mL/min — ABNORMAL LOW (ref >60)
Glucose, Bld: 108 mg/dL — ABNORMAL HIGH (ref 70–99)
Potassium: 4.3 mmol/L (ref 3.5–5.1)
Sodium: 143 mmol/L (ref 135–145)

## 2022-07-16 LAB — CBC
HCT: 41.3 % (ref 39.0–52.0)
Hemoglobin: 13.2 g/dL (ref 13.0–17.0)
MCH: 29.7 pg (ref 26.0–34.0)
MCHC: 32 g/dL (ref 30.0–36.0)
MCV: 93 fL (ref 80.0–100.0)
Platelets: 149 10*3/uL — ABNORMAL LOW (ref 150–400)
RBC: 4.44 MIL/uL (ref 4.22–5.81)
RDW: 15.9 % — ABNORMAL HIGH (ref 11.5–15.5)
WBC: 7.3 10*3/uL (ref 4.0–10.5)
nRBC: 0 % (ref 0.0–0.2)

## 2022-07-16 LAB — BRAIN NATRIURETIC PEPTIDE: B Natriuretic Peptide: 1138.3 pg/mL — ABNORMAL HIGH (ref 0.0–100.0)

## 2022-07-16 LAB — SARS CORONAVIRUS 2 BY RT PCR: SARS Coronavirus 2 by RT PCR: NEGATIVE

## 2022-07-16 MED ORDER — COLCHICINE 0.6 MG PO TABS: 0.3000 mg | ORAL_TABLET | ORAL | Status: DC | PRN

## 2022-07-16 MED ORDER — HYDRALAZINE HCL 20 MG/ML IJ SOLN
10.0000 mg | Freq: Four times a day (QID) | INTRAMUSCULAR | Status: DC | PRN
  Administered 2022-07-17: 10 mg via INTRAVENOUS
  Filled 2022-07-16: qty 1

## 2022-07-16 MED ORDER — ENSURE ENLIVE PO LIQD
237.0000 mL | Freq: Two times a day (BID) | ORAL | Status: DC
  Administered 2022-07-16 – 2022-07-17 (×3): 237 mL via ORAL

## 2022-07-16 MED ORDER — CLONIDINE HCL 0.1 MG PO TABS
0.1000 mg | ORAL_TABLET | Freq: Two times a day (BID) | ORAL | Status: DC
  Administered 2022-07-16 – 2022-07-17 (×3): 0.1 mg via ORAL
  Filled 2022-07-16 (×3): qty 1

## 2022-07-16 MED ORDER — TRAZODONE HCL 50 MG PO TABS: 25.0000 mg | ORAL_TABLET | Freq: Every evening | ORAL | Status: DC | PRN

## 2022-07-16 MED ORDER — ROSUVASTATIN CALCIUM 10 MG PO TABS
20.0000 mg | ORAL_TABLET | Freq: Every day | ORAL | Status: DC
  Administered 2022-07-16 – 2022-07-17 (×2): 20 mg via ORAL
  Filled 2022-07-16 (×2): qty 1
  Filled 2022-07-16: qty 2

## 2022-07-16 MED ORDER — HYDRALAZINE HCL 50 MG PO TABS
100.0000 mg | ORAL_TABLET | Freq: Three times a day (TID) | ORAL | Status: DC
  Administered 2022-07-16 – 2022-07-17 (×3): 100 mg via ORAL
  Filled 2022-07-16 (×3): qty 2

## 2022-07-16 MED ORDER — MAGNESIUM HYDROXIDE 400 MG/5ML PO SUSP: 30.0000 mL | Freq: Every day | ORAL | Status: DC | PRN

## 2022-07-16 MED ORDER — ACETAMINOPHEN 325 MG PO TABS: 650.0000 mg | ORAL_TABLET | Freq: Four times a day (QID) | ORAL | Status: DC | PRN

## 2022-07-16 MED ORDER — APIXABAN 2.5 MG PO TABS
2.5000 mg | ORAL_TABLET | Freq: Two times a day (BID) | ORAL | Status: DC
  Administered 2022-07-16 – 2022-07-17 (×3): 2.5 mg via ORAL
  Filled 2022-07-16 (×4): qty 1

## 2022-07-16 MED ORDER — ACETAMINOPHEN 650 MG RE SUPP: 650.0000 mg | Freq: Four times a day (QID) | RECTAL | Status: DC | PRN

## 2022-07-16 MED ORDER — ISOSORBIDE MONONITRATE ER 30 MG PO TB24
30.0000 mg | ORAL_TABLET | Freq: Every day | ORAL | Status: DC
  Administered 2022-07-17: 30 mg via ORAL
  Filled 2022-07-16: qty 1

## 2022-07-16 MED ORDER — ONDANSETRON HCL 4 MG PO TABS: 4.0000 mg | ORAL_TABLET | Freq: Four times a day (QID) | ORAL | Status: DC | PRN

## 2022-07-16 MED ORDER — ADULT MULTIVITAMIN W/MINERALS CH
1.0000 | ORAL_TABLET | Freq: Every day | ORAL | Status: DC
  Administered 2022-07-16 – 2022-07-17 (×2): 1 via ORAL
  Filled 2022-07-16 (×2): qty 1

## 2022-07-16 MED ORDER — FAMOTIDINE 20 MG PO TABS: 20.0000 mg | ORAL_TABLET | Freq: Two times a day (BID) | ORAL | Status: DC | PRN

## 2022-07-16 MED ORDER — METOPROLOL TARTRATE 50 MG PO TABS
50.0000 mg | ORAL_TABLET | Freq: Two times a day (BID) | ORAL | Status: DC
  Administered 2022-07-16 – 2022-07-17 (×3): 50 mg via ORAL
  Filled 2022-07-16 (×2): qty 1
  Filled 2022-07-16: qty 2

## 2022-07-16 MED ORDER — ALLOPURINOL 100 MG PO TABS
50.0000 mg | ORAL_TABLET | Freq: Every day | ORAL | Status: DC
  Administered 2022-07-16 – 2022-07-17 (×2): 50 mg via ORAL
  Filled 2022-07-16 (×2): qty 0.5
  Filled 2022-07-16: qty 1

## 2022-07-16 MED ORDER — FUROSEMIDE 10 MG/ML IJ SOLN
40.0000 mg | Freq: Two times a day (BID) | INTRAMUSCULAR | Status: DC
  Administered 2022-07-16 (×2): 40 mg via INTRAVENOUS
  Filled 2022-07-16 (×2): qty 4

## 2022-07-16 MED ORDER — COLCHICINE 0.6 MG PO TABS
0.3000 mg | ORAL_TABLET | Freq: Every day | ORAL | Status: DC
  Administered 2022-07-16 – 2022-07-17 (×2): 0.3 mg via ORAL
  Filled 2022-07-16: qty 1
  Filled 2022-07-16 (×2): qty 0.5

## 2022-07-16 MED ORDER — FINASTERIDE 5 MG PO TABS
5.0000 mg | ORAL_TABLET | Freq: Every day | ORAL | Status: DC
  Administered 2022-07-16 – 2022-07-17 (×2): 5 mg via ORAL
  Filled 2022-07-16 (×3): qty 1

## 2022-07-16 MED ORDER — PROSOURCE PLUS PO LIQD
30.0000 mL | Freq: Three times a day (TID) | ORAL | Status: DC
  Filled 2022-07-16: qty 30

## 2022-07-16 MED ORDER — FUROSEMIDE 10 MG/ML IJ SOLN
60.0000 mg | Freq: Once | INTRAMUSCULAR | Status: AC
  Administered 2022-07-16: 60 mg via INTRAVENOUS
  Filled 2022-07-16: qty 8

## 2022-07-16 MED ORDER — ONDANSETRON HCL 4 MG/2ML IJ SOLN: 4.0000 mg | Freq: Four times a day (QID) | INTRAMUSCULAR | Status: DC | PRN

## 2022-07-16 NOTE — ED Notes (Signed)
Pt's son administered pt's home meds while in the lobby waiting for a room in main ED. Per packaging from [facility], patient took the following at about 0045am:  Metoprolol tartrate 100mg  Hydralazine 100mg   Isosorbide mononitrate ER 30mg   Clonidine 0.1mg   Eliquis 2.5mg    BP currently 131/98  MD notified

## 2022-07-16 NOTE — ED Provider Notes (Signed)
[facility] Provider Note    Event Date/Time   First MD Initiated Contact with Patient 07/16/22 0122     (approximate)   History   Hypertension   HPI  George Bolton is a 81 y.o. male who presents to the ED for evaluation of Hypertension   I reviewed cardiology clinic visit from 1 month ago.  History of diastolic dysfunction, CKD, HTN, A-fib.  Anticoagulated on Eliquis.  Metoprolol, Imdur, hydralazine, furosemide, clonidine.  Patient presents to the ED, accompanied by his son, for evaluation of hypertension, dyspnea and increased lower extremity swelling.  Present for the past couple days.  No fever no cough.  No abdominal pain or chest pain.   Physical Exam   Triage Vital Signs: ED Triage Vitals  Enc Vitals Group     BP 07/15/22 1941 (!) 167/135     Pulse Rate 07/15/22 1941 95     Resp 07/15/22 1941 18     Temp 07/15/22 1941 97.8 F (36.6 C)     Temp Source 07/15/22 1941 Oral     SpO2 07/15/22 1941 97 %     Weight 07/15/22 1946 169 lb 12.1 oz (77 kg)     Height 07/15/22 1946 6' (1.829 m)     Head Circumference --      Peak Flow --      Pain Score --      Pain Loc --      Pain Edu? --      Excl. in [place]? --     Most recent vital signs: Vitals:   07/16/22 0230 07/16/22 0300  BP: (!) 143/95 (!) 148/93  Pulse: 89 70  Resp: (!) 24 20  Temp: 98.1 F (36.7 C)   SpO2: 97% 98%    General: Awake, no distress.  CV:  Good peripheral perfusion.  Resp:  Tachypnea to the mid 20s. Abd:  No distention.  MSK:  No deformity noted.  Edema to bilateral lower extremities. Neuro:  No focal deficits appreciated. Other:     ED Results / Procedures / Treatments   Labs (all labs ordered are listed, but only abnormal results are displayed) Labs Reviewed  BASIC METABOLIC PANEL - Abnormal; Notable for the following components:      Result Value   Chloride 113 (*)    Glucose, Bld 129 (*)    BUN 42 (*)    Creatinine, Ser 2.31 (*)    GFR, Estimated  27 (*)    All other components within normal limits  CBC - Abnormal; Notable for the following components:   RDW 16.1 (*)    All other components within normal limits  TROPONIN I (HIGH SENSITIVITY) - Abnormal; Notable for the following components:   Troponin I (High Sensitivity) 26 (*)    All other components within normal limits  TROPONIN I (HIGH SENSITIVITY) - Abnormal; Notable for the following components:   Troponin I (High Sensitivity) 29 (*)    All other components within normal limits  SARS CORONAVIRUS 2 BY RT PCR  BRAIN NATRIURETIC PEPTIDE    EKG With a rate of 99 bpm.  Rightward axis and incomplete right bundle.  No STEMI.  RADIOLOGY CXR interpreted by me without evidence of acute cardiopulmonary pathology.  Official radiology report(s): DG Chest Port 1 View  Result Date: 07/15/2022 CLINICAL DATA:  Hypertension. EXAM: PORTABLE CHEST 1 VIEW COMPARISON:  Radiograph 12/10/2021 FINDINGS: The patient's chin obscures the apices, kyphotic positioning. Stable heart size and mediastinal contours.  Aortic atherosclerosis. No pulmonary edema, pleural effusion, pneumothorax or focal airspace disease. No acute osseous abnormalities are seen. IMPRESSION: No acute abnormality. Electronically Signed   By: Keith Rake M.D.   On: 07/15/2022 20:08    PROCEDURES and INTERVENTIONS:  .1-3 Lead EKG Interpretation  Performed by: Vladimir Crofts, MD Authorized by: Vladimir Crofts, MD     Interpretation: normal     ECG rate:  70   ECG rate assessment: normal     Rhythm: sinus rhythm     Ectopy: none     Conduction: normal     Medications  furosemide (LASIX) injection 60 mg (60 mg Intravenous Given 07/16/22 0227)     IMPRESSION / MDM / ASSESSMENT AND PLAN / ED COURSE  I reviewed the triage vital signs and the nursing notes.  Differential diagnosis includes, but is not limited to, CHF exacerbation, ACS, viral syndrome, COVID-19  {Patient presents with symptoms of an acute illness or  injury that is potentially life-threatening.  81 year old male presents to the ED with evidence of CHF exacerbation requiring medical admission.  Not hypoxic, but is tachypneic.  CXR without infiltrates and troponins are flat, though marginally elevated.  CKD around baseline and no leukocytosis or signs of sepsis.  Due to the continued symptomatic nature of his symptoms we will consult medicine for admission.  Clinical Course as of 07/16/22 0354  Thu Jul 16, 2022  0348 Reassessed. Put out about 350cc of urine, "maybe a little better." But still symptomatic.  Shared decision making.  Will admit. [DS]    Clinical Course User Index [DS] Vladimir Crofts, MD     FINAL CLINICAL IMPRESSION(S) / ED DIAGNOSES   Final diagnoses:  Acute on chronic diastolic congestive heart failure ([place])  Primary hypertension  Shortness of breath     Rx / DC Orders   ED Discharge Orders     None        Note:  This document was prepared using Dragon voice recognition software and may include unintentional dictation errors.   Vladimir Crofts, MD 07/16/22 [phone]

## 2022-07-16 NOTE — Assessment & Plan Note (Signed)
-   We will continue his Eliquis and Lopressor.

## 2022-07-16 NOTE — Progress Notes (Signed)
Initial Nutrition Assessment  DOCUMENTATION CODES:   Not applicable  INTERVENTION:   -D/c Prosource Plus -MVI with minerals daily -Ensure Enlive po BID, each supplement provides 350 kcal and 20 grams of protein -Liberalize diet to 2 gram sodium for wider variety of meal selections -Provided "Low Sodium Nutrition Therapy" handout from [facility] Nutrition Care Manual; attached to AVS/ discharge summary  NUTRITION DIAGNOSIS:   Increased nutrient needs related to chronic illness (CHF) as evidenced by estimated needs.  GOAL:   Patient will meet greater than or equal to 90% of their needs  MONITOR:   PO intake, Supplement acceptance  REASON FOR ASSESSMENT:   Rounds    ASSESSMENT:   Pt with medical history significant for chronic atrial fibrillation, chronic diastolic CHF, gout and hypertension, who presented with a [place] of worsening dyspnea over the last few days with associated orthopnea as well as dyspnea on exertion with worsening lower extremity edema.  Pt admitted with CHF and hypertensive urgency.   Reviewed I/O's: -1.3 L x 24 hours  UOP: 1.2 L x 24 hours   Pt unavailable at time of visit. RD unable to obtain further nutrition-related history or complete nutrition-focused physical exam at this time.    Pt currently on a heart healthy diet. No meal completion data available to assess at this time.   Reviewed wt hx; pt has experienced a 4.1% wt loss over the past 6 months, which is not significant for time frame.   Pt with increased nutritional needs for chronic illness; pt would benefit from addition of oral nutrition supplements.   Medications reviewed and include lasix.   Labs reviewed: CBGS: 183.   Diet Order:   Diet Order             Diet Heart Room service appropriate? Yes; Fluid consistency: Thin  Diet effective now                   EDUCATION NEEDS:   No education needs have been identified at this time  Skin:  Skin Assessment: Reviewed RN  Assessment  Last BM:  Unknown  Height:   Ht Readings from Last 1 Encounters:  07/15/22 6' (1.829 m)    Weight:   Wt Readings from Last 1 Encounters:  07/15/22 77 kg    Ideal Body Weight:  80.9 kg  BMI:  Body mass index is 23.02 kg/m.  Estimated Nutritional Needs:   Kcal:  2000-2200  Protein:  105-120 grams  Fluid:  > 2 L    Loistine Chance, RD, LDN, [place] Registered Dietitian II Certified Diabetes Care and Education Specialist Please refer to [facility] for RD and/or RD on-call/weekend/after hours pager

## 2022-07-16 NOTE — H&P (Addendum)
[place]   PATIENT NAME: George Bolton    MR#:  [id number]  DATE OF BIRTH:  06/16/1941  DATE OF ADMISSION:  07/16/2022  PRIMARY CARE PHYSICIAN: Janna Arch, MD   Patient is coming from: Home  REQUESTING/REFERRING PHYSICIAN: Vladimir Crofts, MD  CHIEF COMPLAINT:   Chief Complaint  Patient presents with   Hypertension    HISTORY OF PRESENT ILLNESS:  George Bolton is a 81 y.o. African-American male with medical history significant for chronic atrial fibrillation, chronic diastolic CHF, gout and hypertension, who presented to the emergency room with a [place] of worsening dyspnea over the last few days with associated orthopnea as well as dyspnea on exertion with worsening lower extremity edema.  He denies any significant cough or wheezing.  No fever or chills.  No nausea or vomiting or abdominal pain.  No chest pain or palpitations.  No dysuria, oliguria or hematuria or flank pain.  No bleeding diathesis.  ED Course: When he came to the ER BP was 167/135 and later 188/133 with otherwise normal vital signs and later mild tachypnea with respiratory rate of 23.  Labs revealed a BUN of 42 with creatinine 2.31 close to baseline and in fact better than previous levels.  High sensitive troponin was 20 and later 26 and 29 and CBC within normal. EKG as reviewed by me : EKG showed atrial fibrillation with rate of 99 with right axis deviation and T wave inversion inferiorly.   Imaging: Portable chest x-ray showed no acute cardiopulmonary disease.  The patient was given 60 mg of IV Lasix.  He will be admitted to a cardiac telemetry bed for further evaluation and management. PAST MEDICAL HISTORY:   Past Medical History:  Diagnosis Date   Chronic atrial fibrillation ([place])    a. Afib dates back to at least 2002 when reviewing prior EKG with EKGs from 2014 onward showing Afib; b. CHADS2VASc at least 4 (CHF, HTN, age x 2)   Chronic diastolic CHF (congestive heart failure) ([place])     a. TTE 5/19: EF 70-75%, mod LVH, near complete obliteration of the mid and apical LV cavity (can be seen in apical hypertrophic CM), mildly dilated LA, RV cavity size nl, RV wall thickness nl, RVSF mildly reduced, mildly dilated RA; b. 02/2018 Limited echo w/ definity: EF 50-65%, no apical hypertrophic CM.   Gout    Hypertension     PAST SURGICAL HISTORY:   Past Surgical History:  Procedure Laterality Date   CHOLECYSTECTOMY N/A 01/13/2017   Procedure: LAPAROSCOPIC CHOLECYSTECTOMY;  Surgeon: Jules Husbands, MD;  Location: [facility] ORS;  Service: General;  Laterality: N/A;    SOCIAL HISTORY:   Social History   Tobacco Use   Smoking status: Never   Smokeless tobacco: Never  Substance Use Topics   Alcohol use: No    FAMILY HISTORY:   Family History  Problem Relation Age of Onset   Stroke Mother     DRUG ALLERGIES:   Allergies  Allergen Reactions   Cortisone Anaphylaxis   Triamcinolone     Other reaction(s): Unknown    REVIEW OF SYSTEMS:   ROS As per history of present illness. All pertinent systems were reviewed above. Constitutional, HEENT, cardiovascular, respiratory, GI, GU, musculoskeletal, neuro, psychiatric, endocrine, integumentary and hematologic systems were reviewed and are otherwise negative/unremarkable except for positive findings mentioned above in the HPI.   MEDICATIONS AT HOME:   Prior to Admission medications   Medication Sig Start Date End Date  Taking? Authorizing Provider  acetaminophen (TYLENOL) 325 MG tablet Take 2 tablets (650 mg total) by mouth every 6 (six) hours as needed for mild pain (or Fever >/= 101). 11/28/21   Loletha Grayer, MD  allopurinol (ZYLOPRIM) 100 MG tablet Take 0.5 tablets (50 mg total) by mouth daily. 11/28/21   Loletha Grayer, MD  amoxicillin (AMOXIL) 500 MG tablet Take 500 mg by mouth 2 (two) times daily. 05/21/22   [provider]  apixaban (ELIQUIS) 2.5 MG TABS tablet Take 2.5 mg by mouth 2 (two) times daily. 12/27/19    [provider]  cloNIDine (CATAPRES) 0.2 MG tablet Take 0.1 mg by mouth 2 (two) times daily. 11/07/21   [provider]  colchicine 0.6 MG tablet Take 0.5 tablets (0.3 mg total) by mouth daily. 11/29/21   Loletha Grayer, MD  famotidine (PEPCID) 20 MG tablet Take 20 mg by mouth 2 (two) times daily as needed. 12/16/21   [provider]  finasteride (PROSCAR) 5 MG tablet Take 1 tablet (5 mg total) by mouth daily. 05/29/22   Vaillancourt, Aldona Bar, PA-C  furosemide (LASIX) 20 MG tablet Take 1 tablet (20 mg total) by mouth daily. Hold till 12/16/21 and restart on 12/17/21 12/17/21 01/16/22  Max Sane, MD  hydrALAZINE (APRESOLINE) 100 MG tablet Take 1 tablet (100 mg total) by mouth 3 (three) times daily. 04/02/22   Alisa Graff, FNP  isosorbide mononitrate (IMDUR) 30 MG 24 hr tablet Take 30 mg by mouth daily. 01/31/21   [provider]  metoprolol tartrate (LOPRESSOR) 50 MG tablet Take 1 tablet (50 mg total) by mouth 2 (two) times daily. 11/28/21   Loletha Grayer, MD  Multiple Vitamin (MULTIVITAMIN WITH MINERALS) TABS tablet Take 1 tablet by mouth daily. 11/29/21   Loletha Grayer, MD  Nutritional Supplements (,FEEDING SUPPLEMENT, PROSOURCE PLUS) liquid Take 30 mLs by mouth 3 (three) times daily between meals. 11/28/21   Loletha Grayer, MD  nystatin cream (MYCOSTATIN) Apply topically 4 (four) times daily. 05/22/22   [provider]  rosuvastatin (CRESTOR) 20 MG tablet Take 1 tablet (20 mg total) by mouth daily. 11/28/21   Loletha Grayer, MD      VITAL SIGNS:  Blood pressure (!) 132/98, pulse 87, temperature 98.1 F (36.7 C), temperature source Oral, resp. rate 15, height 6' (1.829 m), weight 77 kg, SpO2 95 %.  PHYSICAL EXAMINATION:  Physical Exam  GENERAL:  81 y.o.-year-old African-American male patient lying in the bed with mild respiratory distress with conversational dyspnea. EYES: Pupils equal, round, reactive to light and accommodation. No scleral icterus.  Extraocular muscles intact.  HEENT: Head atraumatic, normocephalic. Oropharynx and nasopharynx clear.  NECK:  Supple, no jugular venous distention. No thyroid enlargement, no tenderness.  LUNGS: Diminished bibasilar breath sounds with bibasilar rales.  No use of accessory muscles of respiration.  CARDIOVASCULAR: Regular rate and rhythm, S1, S2 normal. No murmurs, rubs, or gallops.  ABDOMEN: Soft, nondistended, nontender. Bowel sounds present. No organomegaly or mass.  EXTREMITIES: No pedal edema, cyanosis, or clubbing.  NEUROLOGIC: Cranial nerves II through XII are intact. Muscle strength 5/5 in all extremities. Sensation intact. Gait not checked.  PSYCHIATRIC: The patient is alert and oriented x 3.  Normal affect and good eye contact. SKIN: No obvious rash, lesion, or ulcer.   LABORATORY PANEL:   CBC Recent Labs  Lab 07/15/22 1948  WBC 8.0  HGB 14.0  HCT 43.8  PLT 161   ------------------------------------------------------------------------------------------------------------------  Chemistries  Recent Labs  Lab 07/15/22 1948  NA 143  K 4.7  CL 113*  CO2 22  GLUCOSE 129*  BUN 42*  CREATININE 2.31*  CALCIUM 9.9   ------------------------------------------------------------------------------------------------------------------  Cardiac Enzymes No results for input(s): "TROPONINI" in the last 168 hours. ------------------------------------------------------------------------------------------------------------------  RADIOLOGY:  DG Chest Port 1 View  Result Date: 07/15/2022 CLINICAL DATA:  Hypertension. EXAM: PORTABLE CHEST 1 VIEW COMPARISON:  Radiograph 12/10/2021 FINDINGS: The patient's chin obscures the apices, kyphotic positioning. Stable heart size and mediastinal contours. Aortic atherosclerosis. No pulmonary edema, pleural effusion, pneumothorax or focal airspace disease. No acute osseous abnormalities are seen. IMPRESSION: No acute abnormality. Electronically  Signed   By: Keith Rake M.D.   On: 07/15/2022 20:08      IMPRESSION AND PLAN:  Assessment and Plan: * Acute on chronic diastolic (congestive) heart failure ([place])  -The patient will be admitted to a cardiac telemetry bed. - We will continue diuresis with IV Lasix. - Will follow serial troponins.    Mildly elevated troponin likely secondary to demand ischemia due to acute CHF. - Optimal BP control will be pursued. .    Hypertensive urgency - This is likely the culprit for his acute CHF. - We will continue his antihypertensives. - We will place on as needed IV hydralazine.  CKD (chronic kidney disease), stage IV ([place]) - The patient has stage IV chronic kidney disease. - We will monitor renal functions with diuresis.  Dyslipidemia - We will continue statin therapy.  Atrial fibrillation, chronic (HCC) - We will continue his Eliquis and Lopressor.  Gout - We will continue his allopurinol and colchicine.     DVT prophylaxis: Eliquis. Advanced Care Planning:  Code Status: Patient is DNR/DNI.  This was discussed with him. Family Communication:  The plan of care was discussed in details with the patient (and family). I answered all questions. The patient agreed to proceed with the above mentioned plan. Further management will depend upon hospital course. Disposition Plan: Back to previous home environment Consults called: none.  All the records are reviewed and case discussed with ED provider.  Status is: Inpatient   At the time of the admission, it appears that the appropriate admission status for this patient is inpatient.  This is judged to be reasonable and necessary in order to provide the required intensity of service to ensure the patient's safety given the presenting symptoms, physical exam findings and initial radiographic and laboratory data in the context of comorbid conditions.  The patient requires inpatient status due to high intensity of service, high risk of  further deterioration and high frequency of surveillance required.  I certify that at the time of admission, it is my clinical judgment that the patient will require inpatient hospital care extending more than 2 midnights.                            Dispo: The patient is from: Home              Anticipated d/c is to: Home              Patient currently is not medically stable to d/c.              Difficult to place patient: No  Christel Mormon M.D on 07/16/2022 at 5:00 AM  [facility]   From 7 PM-7 AM, contact night-coverage [email or web address]  CC: Primary care physician; Janna Arch, MD

## 2022-07-16 NOTE — Discharge Instructions (Signed)

## 2022-07-16 NOTE — Assessment & Plan Note (Signed)
-   The patient has stage IV chronic kidney disease. - We will monitor renal functions with diuresis.

## 2022-07-16 NOTE — ED Notes (Signed)
Breakfast tray at bedside 

## 2022-07-16 NOTE — Assessment & Plan Note (Signed)
-   This is likely the culprit for his acute CHF. - We will continue his antihypertensives. - We will place on as needed IV hydralazine.

## 2022-07-16 NOTE — Assessment & Plan Note (Signed)
-  The patient will be admitted to a cardiac telemetry bed. - We will continue diuresis with IV Lasix. - Will follow serial troponins.    Mildly elevated troponin likely secondary to demand ischemia due to acute CHF. - Optimal BP control will be pursued. Marland Kitchen

## 2022-07-16 NOTE — Assessment & Plan Note (Signed)
-   We will continue statin therapy. 

## 2022-07-16 NOTE — ED Notes (Signed)
Repositioned patient in bed.

## 2022-07-16 NOTE — Progress Notes (Signed)
PROGRESS NOTE    George SAYEGH  GYF:[id number] DOB: 01-11-1941 DOA: 07/16/2022 PCP: Janna Arch, MD   Assessment & Plan:   Principal Problem:   Acute on chronic diastolic (congestive) heart failure (HCC) Active Problems:   Hypertensive urgency   Gout   Atrial fibrillation, chronic (HCC)   Dyslipidemia   CKD (chronic kidney disease), stage IV (HCC)  Assessment and Plan: Acute on chronic diastolic CHF: continue on IV lasix and po metoprolol, imdur. Monitor I/Os. BNP is elevated  Elevated troponins: likely secondary to demand ischemia   Hypertensive urgency: urgency resolved but still w/ HTN. Continue on metoprolol, imdur, clonidine  CKDIV: Cr is trending down from day prior. Will continue to monitor while on lasix    HLD: continue on statin    Chronic a. fib: continue on metoprolol, eliquis   Gout: continue on home dose of colchicine, allopurinol  Thrombocytopenia: etiology unclear. Will continue to monitor     DVT prophylaxis: eliquis Code Status: DNR Family Communication: called pt's son, DeAndre, no answer but I left a voicemail  Disposition Plan: likely d/c back home   Level of care: Telemetry Cardiac  Status is: Inpatient Remains inpatient appropriate because: severity of illness    Consultants:    Procedures:   Antimicrobials:   Subjective: Pt c/o malaise  Objective: Vitals:   07/16/22 0500 07/16/22 0600 07/16/22 0630 07/16/22 0707  BP: (!) 166/115 135/88 (!) 149/97 (!) 153/98  Pulse: (!) 58  85 95  Resp: (!) 25 15 (!) 26 13  Temp:   97.9 F (36.6 C)   TempSrc:   Oral   SpO2: 95% 95% 97% 95%  Weight:      Height:        Intake/Output Summary (Last 24 hours) at 07/16/2022 0821 Last data filed at 07/16/2022 0641 Gross per 24 hour  Intake --  Output 1200 ml  Net -1200 ml   Filed Weights   07/15/22 1946  Weight: 77 kg    Examination:  General exam: Appears calm and comfortable  Respiratory system: diminished breath sounds  b/l Cardiovascular system: S1 & S2+. No rubs, gallops or clicks.  Gastrointestinal system: Abdomen is nondistended, soft and nontender. Normal bowel sounds heard. Central nervous system: Alert and awake. Moves all extremities  Psychiatry: Judgement and insight appears poor. Flat mood and affect     Data Reviewed: I have personally reviewed following labs and imaging studies  CBC: Recent Labs  Lab 07/15/22 1948 07/16/22 0638  WBC 8.0 7.3  HGB 14.0 13.2  HCT 43.8 41.3  MCV 93.6 93.0  PLT 161 916*   Basic Metabolic Panel: Recent Labs  Lab 07/15/22 1948 07/16/22 0638  NA 143 143  K 4.7 4.3  CL 113* 115*  CO2 22 21*  GLUCOSE 129* 108*  BUN 42* 37*  CREATININE 2.31* 2.19*  CALCIUM 9.9 9.6   GFR: Estimated Creatinine Clearance: 27.8 mL/min (A) (by C-G formula based on SCr of 2.19 mg/dL (H)). Liver Function Tests: No results for input(s): "AST", "ALT", "ALKPHOS", "BILITOT", "PROT", "ALBUMIN" in the last 168 hours. No results for input(s): "LIPASE", "AMYLASE" in the last 168 hours. No results for input(s): "AMMONIA" in the last 168 hours. Coagulation Profile: No results for input(s): "INR", "PROTIME" in the last 168 hours. Cardiac Enzymes: No results for input(s): "CKTOTAL", "CKMB", "CKMBINDEX", "TROPONINI" in the last 168 hours. BNP (last 3 results) No results for input(s): "PROBNP" in the last 8760 hours. HbA1C: No results for input(s): "HGBA1C" in the  last 72 hours. CBG: No results for input(s): "GLUCAP" in the last 168 hours. Lipid Profile: No results for input(s): "CHOL", "HDL", "LDLCALC", "TRIG", "CHOLHDL", "LDLDIRECT" in the last 72 hours. Thyroid Function Tests: No results for input(s): "TSH", "T4TOTAL", "FREET4", "T3FREE", "THYROIDAB" in the last 72 hours. Anemia Panel: No results for input(s): "VITAMINB12", "FOLATE", "FERRITIN", "TIBC", "IRON", "RETICCTPCT" in the last 72 hours. Sepsis Labs: No results for input(s): "PROCALCITON", "LATICACIDVEN" in the last  168 hours.  Recent Results (from the past 240 hour(s))  SARS Coronavirus 2 by RT PCR (hospital order, performed in [facility] hospital lab) *cepheid single result test* Anterior Nasal Swab     Status: None   Collection Time: 07/16/22  4:19 AM   Specimen: Anterior Nasal Swab  Result Value Ref Range Status   SARS Coronavirus 2 by RT PCR NEGATIVE NEGATIVE Final    Comment: (NOTE) SARS-CoV-2 target nucleic acids are NOT DETECTED.  The SARS-CoV-2 RNA is generally detectable in upper and lower respiratory specimens during the acute phase of infection. The lowest concentration of SARS-CoV-2 viral copies this assay can detect is 250 copies / mL. A negative result does not preclude SARS-CoV-2 infection and should not be used as the sole basis for treatment or other patient management decisions.  A negative result may occur with improper specimen collection / handling, submission of specimen other than nasopharyngeal swab, presence of viral mutation(s) within the areas targeted by this assay, and inadequate number of viral copies (<250 copies / mL). A negative result must be combined with clinical observations, patient history, and epidemiological information.  Fact Sheet for Patients:   https://www.patel.info/  Fact Sheet for Healthcare Providers: https://hall.com/  This test is not yet approved or  cleared by the [place] FDA and has been authorized for detection and/or diagnosis of SARS-CoV-2 by FDA under an Emergency Use Authorization (EUA).  This EUA will remain in effect (meaning this test can be used) for the duration of the COVID-19 declaration under Section 564(b)(1) of the Act, 21 U.S.C. section 360bbb-3(b)(1), unless the authorization is terminated or revoked sooner.  Performed at [facility], [address] [place], [place] [address]          Radiology Studies: [facility] [place] 1 View  Result Date:  07/15/2022 CLINICAL DATA:  Hypertension. EXAM: PORTABLE CHEST 1 VIEW COMPARISON:  Radiograph 12/10/2021 FINDINGS: The patient's chin obscures the apices, kyphotic positioning. Stable heart size and mediastinal contours. Aortic atherosclerosis. No pulmonary edema, pleural effusion, pneumothorax or focal airspace disease. No acute osseous abnormalities are seen. IMPRESSION: No acute abnormality. Electronically Signed   By: Keith Rake M.D.   On: 07/15/2022 20:08        Scheduled Meds:  (feeding supplement) PROSource Plus  30 mL Oral TID BM   allopurinol  50 mg Oral Daily   apixaban  2.5 mg Oral BID   cloNIDine  0.1 mg Oral BID   colchicine  0.3 mg Oral Daily   finasteride  5 mg Oral Daily   furosemide  40 mg Intravenous Q12H   hydrALAZINE  100 mg Oral TID   [START ON 07/17/2022] isosorbide mononitrate  30 mg Oral Daily   metoprolol tartrate  50 mg Oral BID   multivitamin with minerals  1 tablet Oral Daily   rosuvastatin  20 mg Oral Daily   Continuous Infusions:   LOS: 0 days    Time spent: 35 mins     Wyvonnia Dusky, MD [facility] Pager [phone]  If 7PM-7AM, please contact night-coverage [email or web address] 07/16/2022, 8:21 AM

## 2022-07-16 NOTE — Assessment & Plan Note (Addendum)
-   We will continue his allopurinol and colchicine.

## 2022-07-17 DIAGNOSIS — I5033 Acute on chronic diastolic (congestive) heart failure: Secondary | ICD-10-CM | POA: Diagnosis not present

## 2022-07-17 DIAGNOSIS — N184 Chronic kidney disease, stage 4 (severe): Secondary | ICD-10-CM | POA: Diagnosis not present

## 2022-07-17 DIAGNOSIS — I482 Chronic atrial fibrillation, unspecified: Secondary | ICD-10-CM | POA: Diagnosis not present

## 2022-07-17 LAB — BASIC METABOLIC PANEL
Anion gap: 6 (ref 5–15)
BUN: 43 mg/dL — ABNORMAL HIGH (ref 8–23)
CO2: 25 mmol/L (ref 22–32)
Calcium: 9.5 mg/dL (ref 8.9–10.3)
Chloride: 111 mmol/L (ref 98–111)
Creatinine, Ser: 2.39 mg/dL — ABNORMAL HIGH (ref 0.61–1.24)
GFR, Estimated: 26 mL/min — ABNORMAL LOW (ref >60)
Glucose, Bld: 127 mg/dL — ABNORMAL HIGH (ref 70–99)
Potassium: 4.3 mmol/L (ref 3.5–5.1)
Sodium: 142 mmol/L (ref 135–145)

## 2022-07-17 LAB — CBC
HCT: 40.6 % (ref 39.0–52.0)
Hemoglobin: 13.2 g/dL (ref 13.0–17.0)
MCH: 30 pg (ref 26.0–34.0)
MCHC: 32.5 g/dL (ref 30.0–36.0)
MCV: 92.3 fL (ref 80.0–100.0)
Platelets: 136 K/uL — ABNORMAL LOW (ref 150–400)
RBC: 4.4 MIL/uL (ref 4.22–5.81)
RDW: 15.5 % (ref 11.5–15.5)
WBC: 9.5 K/uL (ref 4.0–10.5)
nRBC: 0 % (ref 0.0–0.2)

## 2022-07-17 MED ORDER — FUROSEMIDE 10 MG/ML IJ SOLN
40.0000 mg | Freq: Every day | INTRAMUSCULAR | Status: DC
  Administered 2022-07-17: 40 mg via INTRAVENOUS
  Filled 2022-07-17: qty 4

## 2022-07-17 NOTE — TOC Transition Note (Signed)
Transition of Care [facility]) - CM/SW Discharge Note   Patient Details  Name: George Bolton MRN: [id number] Date of Birth: 1941/10/08  [facility] CM/SW Contact:  Alberteen Sam, LCSW Phone Number: 07/17/2022, 12:25 PM   Clinical Narrative:     CSW notes patient to dc home today, attempted to call [facility] to notify no answer. Patient's son to pick up today for discharge. No other needs identified.   Final next level of care: Home/Self Care Barriers to Discharge: No Barriers Identified   Patient Goals and CMS Choice Patient states their goals for this hospitalization and ongoing recovery are:: to go home CMS Medicare.gov Compare Post Acute Care list provided to:: Patient Choice offered to / list presented to : Patient  Discharge Placement                       Discharge Plan and Services                                     Social Determinants of Health (SDOH) Interventions     Readmission Risk Interventions     No data to display

## 2022-07-17 NOTE — Discharge Summary (Signed)
Physician Discharge Summary  CHANZE TEAGLE UKG:[id number] DOB: [birth year] DOA: 07/16/2022  PCP: Janna Arch, MD  Admit date: 07/16/2022 Discharge date: 07/17/2022  Admitted From: home  Disposition:  home   Recommendations for Outpatient Follow-up:  Follow up with PCP in 1-2 weeks  Home Health: no  Equipment/Devices:  Discharge Condition: stable  CODE STATUS: DNR  Diet recommendation: Heart Healthy  Brief/Interim Summary: HPI was taken from Dr. Sidney Ace: George Bolton is a 81 y.o. African-American male with medical history significant for chronic atrial fibrillation, chronic diastolic CHF, gout and hypertension, who presented to the emergency room with a [place] of worsening dyspnea over the last few days with associated orthopnea as well as dyspnea on exertion with worsening lower extremity edema.  He denies any significant cough or wheezing.  No fever or chills.  No nausea or vomiting or abdominal pain.  No chest pain or palpitations.  No dysuria, oliguria or hematuria or flank pain.  No bleeding diathesis.   ED Course: When he came to the ER BP was 167/135 and later 188/133 with otherwise normal vital signs and later mild tachypnea with respiratory rate of 23.  Labs revealed a BUN of 42 with creatinine 2.31 close to baseline and in fact better than previous levels.  High sensitive troponin was 20 and later 26 and 29 and CBC within normal. EKG as reviewed by me : EKG showed atrial fibrillation with rate of 99 with right axis deviation and T wave inversion inferiorly.   Imaging: Portable chest x-ray showed no acute cardiopulmonary disease.   The patient was given 60 mg of IV Lasix.  He will be admitted to a cardiac telemetry bed for further evaluation and management.  As per Dr. Jimmye Norman 9/21-9/22/23: Pt presented w/ shortness of breath likely secondary to CHF exacerbation. Pt was treated w/ IV lasix, po metoprolol & imdur. Pt's shortness of breath resolved prior to d/c. Of note,  pt was noted to have HTN emergency which was likely secondary to medication noncompliance.   Discharge Diagnoses:  Principal Problem:   Acute on chronic diastolic (congestive) heart failure (HCC) Active Problems:   Hypertensive urgency   Gout   Atrial fibrillation, chronic (HCC)   Dyslipidemia   CKD (chronic kidney disease), stage IV (HCC)  Acute on chronic diastolic CHF: continue on lasix and po metoprolol, imdur. Monitor I/Os. BNP is elevated  Elevated troponins: likely secondary to demand ischemia   Hypertensive urgency: urgency resolved but still w/ HTN. Continue on metoprolol, imdur, clonidine. Likely secondary to medication noncompliance. Pt did received medication education.   CKDIV: Cr is labile. Avoid nephrotoxic meds    HLD: continue on statin    Chronic a. fib: continue on metoprolol, eliquis   Gout: continue on home dose of colchicine, allopurinol  Thrombocytopenia: etiology unclear. Will continue to monitor   Discharge Instructions  Discharge Instructions     Diet - low sodium heart healthy   Complete by: As directed    Discharge instructions   Complete by: As directed    F/u w/ PCP in 1-2 weeks   Increase activity slowly   Complete by: As directed       Allergies as of 07/17/2022       Reactions   Cortisone Anaphylaxis   Triamcinolone    Other reaction(s): Unknown        Medication List     STOP taking these medications    amoxicillin 500 MG tablet Commonly known as: AMOXIL  TAKE these medications    (feeding supplement) PROSource Plus liquid Take 30 mLs by mouth 3 (three) times daily between meals.   acetaminophen 325 MG tablet Commonly known as: TYLENOL Take 2 tablets (650 mg total) by mouth every 6 (six) hours as needed for mild pain (or Fever >/= 101).   allopurinol 100 MG tablet Commonly known as: ZYLOPRIM Take 0.5 tablets (50 mg total) by mouth daily.   apixaban 2.5 MG Tabs tablet Commonly known as: ELIQUIS Take 2.5  mg by mouth 2 (two) times daily.   cloNIDine 0.2 MG tablet Commonly known as: CATAPRES Take 0.1 mg by mouth 2 (two) times daily. What changed: Another medication with the same name was removed. Continue taking this medication, and follow the directions you see here.   colchicine 0.6 MG tablet Take 0.5 tablets (0.3 mg total) by mouth daily.   famotidine 20 MG tablet Commonly known as: PEPCID Take 20 mg by mouth 2 (two) times daily as needed.   finasteride 5 MG tablet Commonly known as: PROSCAR Take 1 tablet (5 mg total) by mouth daily.   furosemide 20 MG tablet Commonly known as: LASIX Take 1 tablet (20 mg total) by mouth daily. Hold till 12/16/21 and restart on 12/17/21   hydrALAZINE 100 MG tablet Commonly known as: APRESOLINE Take 1 tablet (100 mg total) by mouth 3 (three) times daily.   isosorbide mononitrate 30 MG 24 hr tablet Commonly known as: IMDUR Take 30 mg by mouth daily.   metoprolol tartrate 50 MG tablet Commonly known as: LOPRESSOR Take 1 tablet (50 mg total) by mouth 2 (two) times daily.   multivitamin with minerals Tabs tablet Take 1 tablet by mouth daily.   nystatin cream Commonly known as: MYCOSTATIN Apply topically 4 (four) times daily.   rosuvastatin 20 MG tablet Commonly known as: Crestor Take 1 tablet (20 mg total) by mouth daily.        Allergies  Allergen Reactions   Cortisone Anaphylaxis   Triamcinolone     Other reaction(s): Unknown    Consultations:    Procedures/Studies: DG Chest Port 1 View  Result Date: 07/15/2022 CLINICAL DATA:  Hypertension. EXAM: PORTABLE CHEST 1 VIEW COMPARISON:  Radiograph 12/10/2021 FINDINGS: The patient's chin obscures the apices, kyphotic positioning. Stable heart size and mediastinal contours. Aortic atherosclerosis. No pulmonary edema, pleural effusion, pneumothorax or focal airspace disease. No acute osseous abnormalities are seen. IMPRESSION: No acute abnormality. Electronically Signed   By: Keith Rake M.D.   On: 07/15/2022 20:08   (Echo, Carotid, EGD, Colonoscopy, ERCP)    Subjective: Pt denies any shortness of breath or chest pain.    Discharge Exam: Vitals:   07/17/22 0819 07/17/22 1124  BP: (!) 158/102 (!) 153/109  Pulse: 91 78  Resp: 18 18  Temp: (!) 97.5 F (36.4 C) 97.6 F (36.4 C)  SpO2: 98% 99%   Vitals:   07/17/22 0100 07/17/22 0400 07/17/22 0819 07/17/22 1124  BP: (!) 113/91 (!) 143/89 (!) 158/102 (!) 153/109  Pulse: 82 90 91 78  Resp: 20 18 18 18   Temp: 98.2 F (36.8 C) 98.1 F (36.7 C) (!) 97.5 F (36.4 C) 97.6 F (36.4 C)  TempSrc: Oral Oral Oral Oral  SpO2: 98% 100% 98% 99%  Weight:      Height:        General: Pt is alert, awake, not in acute distress Cardiovascular: S1/S2 +, no rubs, no gallops Respiratory: CTA bilaterally, no wheezing, no rhonchi Abdominal: Soft, NT, ND,  bowel sounds + Extremities: no edema, no cyanosis    The results of significant diagnostics from this hospitalization (including imaging, microbiology, ancillary and laboratory) are listed below for reference.     Microbiology: Recent Results (from the past 240 hour(s))  SARS Coronavirus 2 by RT PCR (hospital order, performed in [facility] hospital lab) *cepheid single result test* Anterior Nasal Swab     Status: None   Collection Time: 07/16/22  4:19 AM   Specimen: Anterior Nasal Swab  Result Value Ref Range Status   SARS Coronavirus 2 by RT PCR NEGATIVE NEGATIVE Final    Comment: (NOTE) SARS-CoV-2 target nucleic acids are NOT DETECTED.  The SARS-CoV-2 RNA is generally detectable in upper and lower respiratory specimens during the acute phase of infection. The lowest concentration of SARS-CoV-2 viral copies this assay can detect is 250 copies / mL. A negative result does not preclude SARS-CoV-2 infection and should not be used as the sole basis for treatment or other patient management decisions.  A negative result may occur with improper specimen collection /  handling, submission of specimen other than nasopharyngeal swab, presence of viral mutation(s) within the areas targeted by this assay, and inadequate number of viral copies (<250 copies / mL). A negative result must be combined with clinical observations, patient history, and epidemiological information.  Fact Sheet for Patients:   https://www.patel.info/  Fact Sheet for Healthcare Providers: https://hall.com/  This test is not yet approved or  cleared by the [place] FDA and has been authorized for detection and/or diagnosis of SARS-CoV-2 by FDA under an Emergency Use Authorization (EUA).  This EUA will remain in effect (meaning this test can be used) for the duration of the COVID-19 declaration under Section 564(b)(1) of the Act, 21 U.S.C. section 360bbb-3(b)(1), unless the authorization is terminated or revoked sooner.  Performed at [facility], [address] [place], [place] [address]      Labs: BNP (last 3 results) Recent Labs    12/10/21 1626 07/16/22 0419  BNP 1,650.5* 1,660.6*   Basic Metabolic Panel: Recent Labs  Lab 07/15/22 1948 07/16/22 0638 07/17/22 0754  NA 143 143 142  K 4.7 4.3 4.3  CL 113* 115* 111  CO2 22 21* 25  GLUCOSE 129* 108* 127*  BUN 42* 37* 43*  CREATININE 2.31* 2.19* 2.39*  CALCIUM 9.9 9.6 9.5   Liver Function Tests: No results for input(s): "AST", "ALT", "ALKPHOS", "BILITOT", "PROT", "ALBUMIN" in the last 168 hours. No results for input(s): "LIPASE", "AMYLASE" in the last 168 hours. No results for input(s): "AMMONIA" in the last 168 hours. CBC: Recent Labs  Lab 07/15/22 1948 07/16/22 0638 07/17/22 0754  WBC 8.0 7.3 9.5  HGB 14.0 13.2 13.2  HCT 43.8 41.3 40.6  MCV 93.6 93.0 92.3  PLT 161 149* 136*   Cardiac Enzymes: No results for input(s): "CKTOTAL", "CKMB", "CKMBINDEX", "TROPONINI" in the last 168 hours. BNP: Invalid input(s): "POCBNP" CBG: No results for  input(s): "GLUCAP" in the last 168 hours. D-Dimer No results for input(s): "DDIMER" in the last 72 hours. Hgb A1c No results for input(s): "HGBA1C" in the last 72 hours. Lipid Profile No results for input(s): "CHOL", "HDL", "LDLCALC", "TRIG", "CHOLHDL", "LDLDIRECT" in the last 72 hours. Thyroid function studies No results for input(s): "TSH", "T4TOTAL", "T3FREE", "THYROIDAB" in the last 72 hours.  Invalid input(s): "FREET3" Anemia work up No results for input(s): "VITAMINB12", "FOLATE", "FERRITIN", "TIBC", "IRON", "RETICCTPCT" in the last 72 hours. Urinalysis    Component Value Date/Time  COLORURINE STRAW (A) 12/10/2021 1818   APPEARANCEUR CLEAR (A) 12/10/2021 1818   APPEARANCEUR Clear 07/21/2018 0959   LABSPEC 1.010 12/10/2021 1818   LABSPEC 1.014 09/23/2012 1438   PHURINE 6.0 12/10/2021 1818   GLUCOSEU 50 (A) 12/10/2021 1818   GLUCOSEU 50 mg/dL 09/23/2012 1438   HGBUR SMALL (A) 12/10/2021 1818   BILIRUBINUR NEGATIVE 12/10/2021 1818   BILIRUBINUR Negative 07/21/2018 0959   BILIRUBINUR Negative 09/23/2012 1438   [place] 12/10/2021 1818   PROTEINUR 100 (A) 12/10/2021 1818   NITRITE NEGATIVE 12/10/2021 1818   LEUKOCYTESUR TRACE (A) 12/10/2021 1818   LEUKOCYTESUR Negative 09/23/2012 1438   Sepsis Labs Recent Labs  Lab 07/15/22 1948 07/16/22 0638 07/17/22 0754  WBC 8.0 7.3 9.5   Microbiology Recent Results (from the past 240 hour(s))  SARS Coronavirus 2 by RT PCR (hospital order, performed in [place] hospital lab) *cepheid single result test* Anterior Nasal Swab     Status: None   Collection Time: 07/16/22  4:19 AM   Specimen: Anterior Nasal Swab  Result Value Ref Range Status   SARS Coronavirus 2 by RT PCR NEGATIVE NEGATIVE Final    Comment: (NOTE) SARS-CoV-2 target nucleic acids are NOT DETECTED.  The SARS-CoV-2 RNA is generally detectable in upper and lower respiratory specimens during the acute phase of infection. The lowest concentration of  SARS-CoV-2 viral copies this assay can detect is 250 copies / mL. A negative result does not preclude SARS-CoV-2 infection and should not be used as the sole basis for treatment or other patient management decisions.  A negative result may occur with improper specimen collection / handling, submission of specimen other than nasopharyngeal swab, presence of viral mutation(s) within the areas targeted by this assay, and inadequate number of viral copies (<250 copies / mL). A negative result must be combined with clinical observations, patient history, and epidemiological information.  Fact Sheet for Patients:   https://www.patel.info/  Fact Sheet for Healthcare Providers: https://hall.com/  This test is not yet approved or  cleared by the [place] FDA and has been authorized for detection and/or diagnosis of SARS-CoV-2 by FDA under an Emergency Use Authorization (EUA).  This EUA will remain in effect (meaning this test can be used) for the duration of the COVID-19 declaration under Section 564(b)(1) of the Act, 21 U.S.C. section 360bbb-3(b)(1), unless the authorization is terminated or revoked sooner.  Performed at [facility], [address], [place], [place] [address]      Time coordinating discharge: Over 30 minutes  SIGNED:   Wyvonnia Dusky, MD  [facility] 07/17/2022, 12:13 PM Pager   If 7PM-7AM, please contact night-coverage [email or web address]

## 2022-07-17 NOTE — Consult Note (Signed)
   Heart Failure Nurse Navigator Note  HFpEF 60 to 65%.  Moderate concentric LVH.  Moderate left atrial enlargement.  He presented to the emergency room with complaints of worsening shortness of breath over the last several days.  Also noted was lower extremity edema.  Comorbidities:  Atrial fibrillation Gout Hypertension  Medications:  Apixaban 2.5 mg 2 times a day Catapres 0.1 mg 2 times a day Furosemide 40 mg IV daily Hydralazine 100 mg 3 times a day Isosorbide mononitrate 30 mg daily Crestor 20 mg daily  Labs:  Sodium 142, potassium 4.3, chloride 111, CO2 25, BUN 43, creatinine 2.39 up from 2.19 of yesterday, GFR 26 Weight is 77 kg Blood pressure 153/109 Take not documented Output 1200 mL Weight not documented   Initial meeting with patient on this admission.  He states that he lives at home with his wife and son checks on them frequently.  There is currently no family members at the bedside.  He states that he is familiar with the term heart failure as he was diagnosed 16 to 17 years ago.  He admits to not weighing himself daily.  Discussed the importance of daily weight and reporting 2 to 3 pound weight gain overnight or 5 pounds within the week.  He states that he has not used salt on his food for several years.  Wife is in charge of meal preparation but that the son also brings meals.  So discussed fluid restriction which she did not feel that he went over 64 ounces daily.  Aware of follow-up appointment in the outpatient heart failure clinic on September 27 at 4 PM.  Was given the living with heart failure teaching booklet, zone magnet, info on low-sodium and heart failure along with weight chart.  He had no further questions.  Pricilla Riffle RN CHFN  He states that he is compliant with his medications as they come in a bubble pack.

## 2022-07-22 ENCOUNTER — Ambulatory Visit: Payer: Medicare (Managed Care) | Admitting: Family

## 2022-08-04 ENCOUNTER — Telehealth (HOSPITAL_COMMUNITY): Payer: Self-pay

## 2022-08-04 NOTE — Telephone Encounter (Signed)
Attempted to contact Zakariye son to make a home visit with Jeneen Rinks.  Left message for him to return my call    [place] [phone]

## 2022-08-06 ENCOUNTER — Other Ambulatory Visit (HOSPITAL_COMMUNITY): Payer: Self-pay

## 2022-08-06 ENCOUNTER — Encounter (HOSPITAL_COMMUNITY): Payer: Self-pay

## 2022-08-06 NOTE — Progress Notes (Signed)
 Established Patient Visit   Chief Complaint: Chief Complaint  Patient presents with  . Follow-up    SOB Edema (bilateral ankles)    Date of Service: 08/06/2022 Date of Birth: [birth year] PCP: Tobie Lauraine Almarie Vicci, MD  History of Present Illness: George Bolton is a 81 y.o.male patient who presents for a follow up.  PMH significant for CAD, chronic diastolic CHF, paroxsymal atrial fibrillation, h/o rheumatic fever, h/o CVA, HTN, HLD, CKD stage IIIa, BPH, lymphedema and chronic gout. Pt was admitted to hospital on 07/16/2022 due to hypertension, Dx on note is acute on chronic diastolic congestive heart failure. Pt has significant swelling on left lower extremity, notes it goes down when the hospital drains the fluid. Denies any pain. Denies bleeding. Reports some exercise. Reports wearing patch. Report weighing around 175 lbs.    Visit Summaries: (06/15/2022): Pt was seen by Kerry Pizza, NP, for a follow up. An echo was ordered and furosemide  was decreased to 20 mg oral daily PRN. Past Medical and Surgical History  Past Medical History Past Medical History:  Diagnosis Date  . CHF (congestive heart failure) (CMS-HCC)   . Gout   . Hypertension     Past Surgical History He has no past surgical history on file.   Medications and Allergies  Current Medications  Current Outpatient Medications  Medication Sig Dispense Refill  . allopurinoL  (ZYLOPRIM ) 100 MG tablet TAKE 1 TABLET BY MOUTH ONCE DAILY TO LOWER URIC ACID AND PREVENT GOUT    . apixaban  (ELIQUIS ) 2.5 mg tablet Take 1 tablet by mouth twice daily 180 tablet 1  . cloNIDine  HCL (CATAPRES ) 0.1 MG tablet Take 1 tablet by mouth twice daily 180 tablet 3  . colchicine  (COLCRYS ) 0.6 mg tablet Take 1 tablet (0.6 mg total) by mouth once daily as needed (gout flare) Do not repeat x3 days 10 tablet 0  . famotidine  (PEPCID ) 20 MG tablet Take 20 mg by mouth 2 (two) times daily    . finasteride  (PROSCAR ) 5 mg tablet Take 5 mg by mouth once  daily    . FUROsemide  (LASIX ) 20 MG tablet Take 1 tablet (20 mg total) by mouth once daily as needed 30 tablet 5  . hydrALAZINE  (APRESOLINE ) 25 MG tablet TAKE 1 TABLET BY MOUTH THREE TIMES DAILY 30 tablet 0  . HYDROcodone -acetaminophen  (NORCO) 5-325 mg tablet Take one tablet at night for pain; may take up to every 6 hours as needed for pain if not working or driving 20 tablet 0  . isosorbide  mononitrate (IMDUR ) 30 MG ER tablet Take 1 tablet by mouth twice daily 180 tablet 0  . metoprolol  tartrate (LOPRESSOR ) 50 MG tablet Take 1 tablet (50 mg total) by mouth 2 (two) times daily 90 tablet 3  . rosuvastatin  (CRESTOR ) 20 MG tablet Take 20 mg by mouth once daily     No current facility-administered medications for this visit.    Allergies: Cortisone and Kenalog [triamcinolone acetonide]  Social and Family History  Social History  reports that he has quit smoking. He has never used smokeless tobacco. He reports that he does not drink alcohol and does not use drugs.  Family History No family history on file.  Review of Systems   Review of Systems: The patient denies chest pain, shortness of breath, orthopnea, paroxysmal nocturnal dyspnea, pedal edema, palpitations, heart racing, presyncope, syncope. Review of 12 Systems is negative except as described above.  Physical Examination   Vitals:BP (!) 162/88   Pulse 98   Ht 182.9 cm (  6')   Wt 86.2 kg (190 lb)   SpO2 96%   BMI 25.77 kg/m  Ht:182.9 cm (6') Wt:86.2 kg (190 lb) ADJ:Anib surface area is 2.09 meters squared. Body mass index is 25.77 kg/m.  HEENT: Pupils equally reactive to light and accomodation  Neck: Supple without thyromegaly, carotid pulses 2+ Lungs: clear to auscultation bilaterally; no wheezes, rales, rhonchi Heart: Regular rate and rhythm.  No gallops, murmurs or rub Abdomen: soft nontender, nondistended, with normal bowel sounds Extremities: no cyanosis, clubbing, or edema Peripheral Pulses: 2+ in all extremities, 2+  femoral pulses bilaterally Neurologic: Alert and oriented X3; speech intact; face symmetrical; moves all extremities well  Cardiovascular Studies:    Echocardiogram 2D complete: (11/22/2021) IMPRESSIONS   1. Left ventricular ejection fraction, by estimation, is 60 to 65%. The left ventricle has normal function. The left ventricle has no regional wall motion abnormalities. There is moderate concentric left ventricular hypertrophy. Left ventricular  diastolic parameters are indeterminate.   2. Right ventricular systolic function is normal. The right ventricular size is normal.   3. Left atrial size was moderately dilated.   4. The mitral valve is normal in structure. No evidence of mitral valve regurgitation. No evidence of mitral stenosis.   5. The aortic valve is normal in structure. Aortic valve regurgitation is not visualized. No aortic stenosis is present.   6. The inferior vena cava is normal in size with greater than 50% respiratory variability, suggesting right atrial pressure of 3 mmHg.    Echocardiogram 2D complete: (02/2018) Study Conclusions - Left ventricle: The cavity size was normal. There was mild to   moderate concentric hypertrophy. No significant apical   hypertrophy noted with definity . Systolic function was normal.   The estimated ejection fraction was in the range of 60% to 65%.   Wall motion was normal; there were no regional wall motion   abnormalities. The study is not technically sufficient to allow   evaluation of LV diastolic function.  Impressions: - No apical hypertrophic cardiomyopathy noted.    Assessment   81 y.o. male with   1. Chronic diastolic CHF (congestive heart failure), NYHA class 3 (CMS-HCC)   2. History of CVA (cerebrovascular accident)   3. Paroxysmal atrial fibrillation (CMS-HCC)   4. Coronary artery disease involving native coronary artery of native heart without angina pectoris   5. Mixed hyperlipidemia   6. Primary hypertension   7.  Stage 3a chronic kidney disease (CMS-HCC)     Plan   Chronic diastolic CHF NYHA class III, chronic, compensated, stable.  The patient appears euvolemic at this time; however, he continues to have significant dyspnea likely secondary to underlying CHF; poor physical conditioning also likely a contributing factor. Echocardiogram from 11/22/2021 revealed normal LV systolic function with estimated EF of 60 to 65%, no regional wall motion abnormalities, moderate concentric LVH, diastolic parameters were indeterminate and the LA was moderately dilated. Continue diuresis with furosemide  20-40 mg by mouth daily as needed. Continue metoprolol  tartrate 50 mg by mouth twice daily. History of CVA, continue Eliquis  for stroke risk reduction Paroxysmal atrial fibrillation, chronic, rate controlled. The patient has a CHA2DS2-VASc score of 6. Continue Eliquis  for anticoagulation. Continue metoprolol  as above for rate control. CAD without angina, Continue metoprolol , Imdur , hydralazine , clonidine  and rosuvastatin . Hypertension, chronic, reasonably controlled Hyperlipidemia, chronic, well controlled CKD stage IIIa, reasonably controlled., management per nephrology. Recommend avoiding OTC nephrotoxic agents including NSAIDs. Increase clonidine  to 0.2 mg  Increase metoprolol  to 100 mg twice daily Have patient  follow up in 1 month   Return if symptoms worsen or fail to improve.  This note is partially written by [facility], in the presence of and acting as the scribe of Dr. Cara Lovelace.      [facility] I have reviewed, edited and added to the note to reflect my best personal medical judgment.  Attestation Statement:   I personally performed the service. (TP)  DWAYNE JONETTA LOVELACE, MD  [facility] Cardiology [facility] [place], [place] Ph:  [phone] Fax:  [phone] This note was generated in part with voice recognition software, Dragon.  I apologize for any  typographical errors that were not detected and corrected from this process.  They are unintentional.

## 2022-08-06 NOTE — Progress Notes (Signed)
Today had a home visit with Quinlin and wife.  He states doing ok. He was sitting outside on porch in the sun.  Visited with him outside today.  Explained the program and his wife explained that they were both in the [facility] program.  They go all day on Mondays and Wednesdays.  He states he does not like to go.  His wife states if he does not feel good or his BP is elevated they come to the home to check on him. He has medications in bubble packs from them.  They make sure he is taking them correctly.  He has everything for daily living.  He is not weighing regularly at home, explained the importance of it.  He has edema in both lower but worse in left leg.  Him and wife states that is his normal.  Lungs were clear and abdomen soft.  He states he ate pepperoni pizza with pineapple for breakfast.  Discussed lower sodium options, he advised his wife had a lazy day today.  He states drinks about 3 bottles of water a day.  He states urinates a lot.  He has no complaints today such as chest pain, headaches, dizziness or increasing shortness of breath.  Will continue to visit for heart failure, diet and medication management.   [place] [phone]

## 2022-08-19 ENCOUNTER — Telehealth: Payer: Medicare (Managed Care) | Admitting: Nurse Practitioner

## 2022-08-25 ENCOUNTER — Telehealth: Payer: Medicare Other | Admitting: Nurse Practitioner

## 2022-09-18 ENCOUNTER — Other Ambulatory Visit: Payer: Self-pay

## 2022-09-18 ENCOUNTER — Emergency Department: Payer: Medicare (Managed Care)

## 2022-09-18 ENCOUNTER — Encounter: Payer: Self-pay | Admitting: Radiology

## 2022-09-18 ENCOUNTER — Inpatient Hospital Stay
Admission: EM | Admit: 2022-09-18 | Discharge: 2022-09-20 | DRG: 291 | Disposition: A | Discharge status: Home Health | Payer: Medicare (Managed Care) | Attending: Hospitalist | Admitting: Hospitalist

## 2022-09-18 DIAGNOSIS — M109 Gout, unspecified: Secondary | ICD-10-CM | POA: Diagnosis present

## 2022-09-18 DIAGNOSIS — Z8701 Personal history of pneumonia (recurrent): Secondary | ICD-10-CM | POA: Diagnosis not present

## 2022-09-18 DIAGNOSIS — Z20822 Contact with and (suspected) exposure to covid-19: Secondary | ICD-10-CM | POA: Diagnosis present

## 2022-09-18 DIAGNOSIS — Z888 Allergy status to other drugs, medicaments and biological substances status: Secondary | ICD-10-CM

## 2022-09-18 DIAGNOSIS — R7989 Other specified abnormal findings of blood chemistry: Secondary | ICD-10-CM | POA: Diagnosis present

## 2022-09-18 DIAGNOSIS — N184 Chronic kidney disease, stage 4 (severe): Secondary | ICD-10-CM | POA: Diagnosis present

## 2022-09-18 DIAGNOSIS — Z9049 Acquired absence of other specified parts of digestive tract: Secondary | ICD-10-CM

## 2022-09-18 DIAGNOSIS — N189 Chronic kidney disease, unspecified: Secondary | ICD-10-CM | POA: Diagnosis present

## 2022-09-18 DIAGNOSIS — E1121 Type 2 diabetes mellitus with diabetic nephropathy: Secondary | ICD-10-CM | POA: Diagnosis present

## 2022-09-18 DIAGNOSIS — R0602 Shortness of breath: Secondary | ICD-10-CM | POA: Diagnosis present

## 2022-09-18 DIAGNOSIS — Z7901 Long term (current) use of anticoagulants: Secondary | ICD-10-CM | POA: Diagnosis not present

## 2022-09-18 DIAGNOSIS — Z8673 Personal history of transient ischemic attack (TIA), and cerebral infarction without residual deficits: Secondary | ICD-10-CM | POA: Diagnosis not present

## 2022-09-18 DIAGNOSIS — N179 Acute kidney failure, unspecified: Secondary | ICD-10-CM | POA: Diagnosis present

## 2022-09-18 DIAGNOSIS — I509 Heart failure, unspecified: Principal | ICD-10-CM

## 2022-09-18 DIAGNOSIS — I482 Chronic atrial fibrillation, unspecified: Secondary | ICD-10-CM | POA: Diagnosis present

## 2022-09-18 DIAGNOSIS — Z79899 Other long term (current) drug therapy: Secondary | ICD-10-CM | POA: Diagnosis not present

## 2022-09-18 DIAGNOSIS — I130 Hypertensive heart and chronic kidney disease with heart failure and stage 1 through stage 4 chronic kidney disease, or unspecified chronic kidney disease: Principal | ICD-10-CM | POA: Diagnosis present

## 2022-09-18 DIAGNOSIS — I10 Essential (primary) hypertension: Secondary | ICD-10-CM | POA: Diagnosis present

## 2022-09-18 DIAGNOSIS — Z91148 Patient's other noncompliance with medication regimen for other reason: Secondary | ICD-10-CM

## 2022-09-18 DIAGNOSIS — E1122 Type 2 diabetes mellitus with diabetic chronic kidney disease: Secondary | ICD-10-CM | POA: Diagnosis present

## 2022-09-18 DIAGNOSIS — I5033 Acute on chronic diastolic (congestive) heart failure: Secondary | ICD-10-CM | POA: Diagnosis present

## 2022-09-18 LAB — COMPREHENSIVE METABOLIC PANEL
ALT: 16 U/L (ref 0–44)
AST: 27 U/L (ref 15–41)
Albumin: 4.1 g/dL (ref 3.5–5.0)
Alkaline Phosphatase: 68 U/L (ref 38–126)
Anion gap: 8 (ref 5–15)
BUN: 35 mg/dL — ABNORMAL HIGH (ref 8–23)
CO2: 22 mmol/L (ref 22–32)
Calcium: 9.9 mg/dL (ref 8.9–10.3)
Chloride: 111 mmol/L (ref 98–111)
Creatinine, Ser: 2.45 mg/dL — ABNORMAL HIGH (ref 0.61–1.24)
GFR, Estimated: 25 mL/min — ABNORMAL LOW (ref >60)
Glucose, Bld: 107 mg/dL — ABNORMAL HIGH (ref 70–99)
Potassium: 4.5 mmol/L (ref 3.5–5.1)
Sodium: 141 mmol/L (ref 135–145)
Total Bilirubin: 1.4 mg/dL — ABNORMAL HIGH (ref 0.3–1.2)
Total Protein: 7.4 g/dL (ref 6.5–8.1)

## 2022-09-18 LAB — T4, FREE: Free T4: 1.14 ng/dL — ABNORMAL HIGH (ref 0.61–1.12)

## 2022-09-18 LAB — CBC WITH DIFFERENTIAL/PLATELET
Abs Immature Granulocytes: 0.02 10*3/uL (ref 0.00–0.07)
Basophils Absolute: 0 10*3/uL (ref 0.0–0.1)
Basophils Relative: 1 %
Eosinophils Absolute: 0.3 10*3/uL (ref 0.0–0.5)
Eosinophils Relative: 3 %
HCT: 43.1 % (ref 39.0–52.0)
Hemoglobin: 14.1 g/dL (ref 13.0–17.0)
Immature Granulocytes: 0 %
Lymphocytes Relative: 16 %
Lymphs Abs: 1.3 10*3/uL (ref 0.7–4.0)
MCH: 30 pg (ref 26.0–34.0)
MCHC: 32.7 g/dL (ref 30.0–36.0)
MCV: 91.7 fL (ref 80.0–100.0)
Monocytes Absolute: 0.8 10*3/uL (ref 0.1–1.0)
Monocytes Relative: 9 %
Neutro Abs: 5.9 10*3/uL (ref 1.7–7.7)
Neutrophils Relative %: 71 %
Platelets: 154 10*3/uL (ref 150–400)
RBC: 4.7 MIL/uL (ref 4.22–5.81)
RDW: 15.2 % (ref 11.5–15.5)
WBC: 8.3 10*3/uL (ref 4.0–10.5)
nRBC: 0 % (ref 0.0–0.2)

## 2022-09-18 LAB — TROPONIN I (HIGH SENSITIVITY): Troponin I (High Sensitivity): 28 ng/L — ABNORMAL HIGH (ref <18)

## 2022-09-18 LAB — RESP PANEL BY RT-PCR (FLU A&B, COVID) ARPGX2
Influenza A by PCR: NEGATIVE
Influenza B by PCR: NEGATIVE
SARS Coronavirus 2 by RT PCR: NEGATIVE

## 2022-09-18 LAB — TSH: TSH: 1.24 u[IU]/mL (ref 0.350–4.500)

## 2022-09-18 LAB — BRAIN NATRIURETIC PEPTIDE: B Natriuretic Peptide: 1322 pg/mL — ABNORMAL HIGH (ref 0.0–100.0)

## 2022-09-18 MED ORDER — HYDRALAZINE HCL 20 MG/ML IJ SOLN: 5.0000 mg | INTRAMUSCULAR | Status: DC | PRN

## 2022-09-18 MED ORDER — ROSUVASTATIN CALCIUM 10 MG PO TABS
20.0000 mg | ORAL_TABLET | Freq: Every day | ORAL | Status: DC
  Administered 2022-09-19 – 2022-09-20 (×2): 20 mg via ORAL
  Filled 2022-09-18: qty 1
  Filled 2022-09-18: qty 2

## 2022-09-18 MED ORDER — FUROSEMIDE 10 MG/ML IJ SOLN
40.0000 mg | Freq: Two times a day (BID) | INTRAMUSCULAR | Status: DC
  Administered 2022-09-19 (×2): 40 mg via INTRAVENOUS
  Filled 2022-09-18 (×2): qty 4

## 2022-09-18 MED ORDER — ACETAMINOPHEN 650 MG RE SUPP: 650.0000 mg | Freq: Four times a day (QID) | RECTAL | Status: DC | PRN

## 2022-09-18 MED ORDER — ALLOPURINOL 100 MG PO TABS
50.0000 mg | ORAL_TABLET | Freq: Every day | ORAL | Status: DC
  Administered 2022-09-19 – 2022-09-20 (×2): 50 mg via ORAL
  Filled 2022-09-18: qty 1
  Filled 2022-09-18: qty 0.5

## 2022-09-18 MED ORDER — FINASTERIDE 5 MG PO TABS
5.0000 mg | ORAL_TABLET | Freq: Every day | ORAL | Status: DC
  Administered 2022-09-19 – 2022-09-20 (×2): 5 mg via ORAL
  Filled 2022-09-18 (×2): qty 1

## 2022-09-18 MED ORDER — BISACODYL 5 MG PO TBEC: 5.0000 mg | DELAYED_RELEASE_TABLET | Freq: Every day | ORAL | Status: DC | PRN

## 2022-09-18 MED ORDER — ISOSORBIDE MONONITRATE ER 30 MG PO TB24
30.0000 mg | ORAL_TABLET | Freq: Every day | ORAL | Status: DC
  Administered 2022-09-19 – 2022-09-20 (×2): 30 mg via ORAL
  Filled 2022-09-18 (×2): qty 1

## 2022-09-18 MED ORDER — FUROSEMIDE 10 MG/ML IJ SOLN
80.0000 mg | Freq: Once | INTRAMUSCULAR | Status: AC
  Administered 2022-09-18: 80 mg via INTRAVENOUS
  Filled 2022-09-18: qty 8

## 2022-09-18 MED ORDER — SODIUM CHLORIDE 0.9% FLUSH
3.0000 mL | Freq: Two times a day (BID) | INTRAVENOUS | Status: DC
  Administered 2022-09-18 – 2022-09-20 (×4): 3 mL via INTRAVENOUS

## 2022-09-18 MED ORDER — ONDANSETRON HCL 4 MG/2ML IJ SOLN: 4.0000 mg | Freq: Four times a day (QID) | INTRAMUSCULAR | Status: DC | PRN

## 2022-09-18 MED ORDER — ONDANSETRON HCL 4 MG PO TABS: 4.0000 mg | ORAL_TABLET | Freq: Four times a day (QID) | ORAL | Status: DC | PRN

## 2022-09-18 MED ORDER — APIXABAN 2.5 MG PO TABS
2.5000 mg | ORAL_TABLET | Freq: Two times a day (BID) | ORAL | Status: DC
  Administered 2022-09-19 – 2022-09-20 (×4): 2.5 mg via ORAL
  Filled 2022-09-18 (×4): qty 1

## 2022-09-18 MED ORDER — ALBUTEROL SULFATE (2.5 MG/3ML) 0.083% IN NEBU
2.5000 mg | INHALATION_SOLUTION | Freq: Four times a day (QID) | RESPIRATORY_TRACT | Status: DC
  Administered 2022-09-19 – 2022-09-20 (×6): 2.5 mg via RESPIRATORY_TRACT
  Filled 2022-09-18 (×5): qty 3

## 2022-09-18 MED ORDER — COLCHICINE 0.6 MG PO TABS: 0.3000 mg | ORAL_TABLET | Freq: Every day | ORAL | Status: DC

## 2022-09-18 MED ORDER — METOPROLOL TARTRATE 50 MG PO TABS
50.0000 mg | ORAL_TABLET | Freq: Two times a day (BID) | ORAL | Status: DC
  Administered 2022-09-19 – 2022-09-20 (×4): 50 mg via ORAL
  Filled 2022-09-18 (×4): qty 1

## 2022-09-18 MED ORDER — POLYETHYLENE GLYCOL 3350 17 G PO PACK: 17.0000 g | PACK | Freq: Every day | ORAL | Status: DC | PRN

## 2022-09-18 MED ORDER — ASPIRIN 81 MG PO TBEC: 81.0000 mg | DELAYED_RELEASE_TABLET | Freq: Every day | ORAL | Status: DC

## 2022-09-18 MED ORDER — ACETAMINOPHEN 325 MG PO TABS: 650.0000 mg | ORAL_TABLET | Freq: Four times a day (QID) | ORAL | Status: DC | PRN

## 2022-09-18 NOTE — ED Triage Notes (Signed)
First Nurse: Pt here via [facility] with SOB. Pt has a hx of CHF and a fib. Edema to both legs more on the left leg. Pt denies pain.    190/128 95 92% RA

## 2022-09-18 NOTE — ED Provider Notes (Signed)
[facility] Provider Note    Event Date/Time   First MD Initiated Contact with Patient 09/18/22 1712     (approximate)   History   Shortness of Breath   HPI  George Bolton is a 81 y.o. male with a history of atrial fibrillation on Eliquis, diastolic CHF, hypertension, and gout who presents with increased shortness of breath, acute onset today.  The patient states that he woke up early this morning short of breath.  The son reports that he has also had increased leg swelling.  Swelling is worse on the left but this is chronic.  The patient denies cough or fever.  He has no chest pain.  He has no vomiting or diarrhea.  The son believes that he has missed several doses of his medications as he found some pills underneath the sofa cushion.  I reviewed the past medical records.  The patient was most recently admitted in September; per the hospitalist discharge summary from 9/22 he presented with increased shortness of breath and orthopnea as well as edema.  He was treated for CHF with IV Lasix.   Physical Exam   Triage Vital Signs: ED Triage Vitals  Enc Vitals Group     BP 09/18/22 1430 (!) 165/115     Pulse Rate 09/18/22 1430 100     Resp 09/18/22 1430 19     Temp 09/18/22 1502 97.9 F (36.6 C)     Temp Source 09/18/22 1502 Axillary     SpO2 09/18/22 1430 93 %     Weight 09/18/22 1431 170 lb (77.1 kg)     Height 09/18/22 1431 6' (1.829 m)     Head Circumference --      Peak Flow --      Pain Score --      Pain Loc --      Pain Edu? --      Excl. in [place]? --     Most recent vital signs: Vitals:   09/18/22 2130 09/18/22 2200  BP: 115/78 (!) 138/94  Pulse: (!) 110 (!) 57  Resp: (!) 23 (!) 24  Temp:    SpO2: 91% (!) 89%     General: Awake, no distress.  CV:  Good peripheral perfusion.  Normal heart sounds. Resp:  Slightly increased respiratory effort.  Somewhat diminished breath sounds bilaterally with no wheezes or rales. Abd:  No distention.   Other:  2+ bilateral lower extremity edema and chronic venous stasis changes.   ED Results / Procedures / Treatments   Labs (all labs ordered are listed, but only abnormal results are displayed) Labs Reviewed  COMPREHENSIVE METABOLIC PANEL - Abnormal; Notable for the following components:      Result Value   Glucose, Bld 107 (*)    BUN 35 (*)    Creatinine, Ser 2.45 (*)    Total Bilirubin 1.4 (*)    GFR, Estimated 25 (*)    All other components within normal limits  BRAIN NATRIURETIC PEPTIDE - Abnormal; Notable for the following components:   B Natriuretic Peptide 1,322.0 (*)    All other components within normal limits  TROPONIN I (HIGH SENSITIVITY) - Abnormal; Notable for the following components:   Troponin I (High Sensitivity) 28 (*)    All other components within normal limits  RESP PANEL BY RT-PCR (FLU A&B, COVID) ARPGX2  CBC WITH DIFFERENTIAL/PLATELET  COMPREHENSIVE METABOLIC PANEL  CBC  HEMOGLOBIN A1C  T4, FREE  TSH     EKG  ED ECG REPORT I, Arta Silence, the attending physician, personally viewed and interpreted this ECG.  Date: 09/18/2022 EKG Time: 1451 Rate: 99 Rhythm: Atrial fibrillation QRS Axis: normal Intervals: normal ST/T Wave abnormalities: Nonspecific T wave abnormalities Narrative Interpretation: no evidence of acute ischemia    RADIOLOGY  Chest x-ray: I independently viewed and interpreted the images; there is no focal consolidation or edema   PROCEDURES:  Critical Care performed: No  Procedures   MEDICATIONS ORDERED IN ED: Medications  allopurinol (ZYLOPRIM) tablet 50 mg (has no administration in time range)  apixaban (ELIQUIS) tablet 2.5 mg (has no administration in time range)  colchicine tablet 0.3 mg (has no administration in time range)  finasteride (PROSCAR) tablet 5 mg (has no administration in time range)  metoprolol tartrate (LOPRESSOR) tablet 50 mg (has no administration in time range)  isosorbide mononitrate  (IMDUR) 24 hr tablet 30 mg (has no administration in time range)  rosuvastatin (CRESTOR) tablet 20 mg (has no administration in time range)  furosemide (LASIX) injection 40 mg (has no administration in time range)  sodium chloride flush (NS) 0.9 % injection 3 mL (3 mLs Intravenous Given 09/18/22 2243)  acetaminophen (TYLENOL) tablet 650 mg (has no administration in time range)    Or  acetaminophen (TYLENOL) suppository 650 mg (has no administration in time range)  polyethylene glycol (MIRALAX / GLYCOLAX) packet 17 g (has no administration in time range)  bisacodyl (DULCOLAX) EC tablet 5 mg (has no administration in time range)  ondansetron (ZOFRAN) tablet 4 mg (has no administration in time range)    Or  ondansetron (ZOFRAN) injection 4 mg (has no administration in time range)  hydrALAZINE (APRESOLINE) injection 5 mg (has no administration in time range)  aspirin EC tablet 81 mg (has no administration in time range)  albuterol (PROVENTIL) (2.5 MG/3ML) 0.083% nebulizer solution 2.5 mg (has no administration in time range)  furosemide (LASIX) injection 80 mg (80 mg Intravenous Given 09/18/22 1802)     IMPRESSION / MDM / ASSESSMENT AND PLAN / ED COURSE  I reviewed the triage vital signs and the nursing notes.  81 year old male with PMH as noted above presents with increased shortness of breath since this morning associated with increased leg swelling.  The son is concerned he may have missed some doses of medication although the patient denies this.  On exam the patient has somewhat diminished breath sounds.  He is hypertensive.  There is lower extremity edema bilaterally.  Initial work-up reveals elevated BNP.  Respiratory panel is negative.  Creatinine is elevated but this is consistent with the patient's baseline.  Chest x-ray shows vascular congestion with no frank edema.  Differential diagnosis includes, but is not limited to, CHF exacerbation, less likely ACS or other cardiac etiology,  bronchitis.  There is no evidence of pneumonia based on the chest x-ray.  There is no clinical evidence for PE.  We will give IV Lasix, obtain cardiac enzymes, and reassess.  Patient's presentation is most consistent with acute presentation with potential threat to life or bodily function.  The patient is on the cardiac monitor to evaluate for evidence of arrhythmia and/or significant heart rate changes.  ----------------------------------------- 9:09 PM on 09/18/2022 -----------------------------------------  Troponin is minimally elevated consistent with the patient's baseline.  On reassessment, the patient still appears uncomfortable and short of breath although he is not hypoxic or in respiratory distress.  I recommended inpatient admission for further diuresis and monitoring and he agrees.  I consulted Dr. Posey Pronto from the hospitalist  service; based on her discussion she agrees to admit the patient.   FINAL CLINICAL IMPRESSION(S) / ED DIAGNOSES   Final diagnoses:  Acute on chronic congestive heart failure, unspecified heart failure type ([place])     Rx / DC Orders   ED Discharge Orders     None        Note:  This document was prepared using Dragon voice recognition software and may include unintentional dictation errors.    Arta Silence, MD 09/18/22 2315

## 2022-09-18 NOTE — Assessment & Plan Note (Signed)
Patient currently anticoagulated with Eliquis.  Continue with statin therapy. Optimization of blood pressure.

## 2022-09-18 NOTE — Assessment & Plan Note (Signed)
Vitals:   09/18/22 1430 09/18/22 1720 09/18/22 1800 09/18/22 1830  BP: (!) 165/115 (!) 176/127 (!) 180/116 (!) 153/106   09/18/22 1900 09/18/22 1930  BP: (!) 170/113 (!) 149/115  We will continue patient on metoprolol, Imdur, hydralazine, clonidine.

## 2022-09-18 NOTE — ED Provider Triage Note (Signed)
Emergency Medicine Provider Triage Evaluation Note  KEYANDRE PILEGGI , a 81 y.o. male  was evaluated in triage.  Pt complains of shortness of breath and cough since this morning.  He reports that he always has lower extremity edema but does not feel that this is any different today.  Denies orthopnea worse than usual.  Denies fevers or chills.  Denies weight gain.  Review of Systems  Positive: Cough, shortness of breath, leg swelling Negative: Fever  Physical Exam  BP (!) 165/115 (BP Location: Left Arm)   Pulse 100   Resp 19   Ht 6' (1.829 m)   Wt 77.1 kg   SpO2 93%   BMI 23.06 kg/m  Gen:   Awake, no distress   Resp:  Normal effort  MSK:   Moves extremities without difficulty  Other:    Medical Decision Making  Medically screening exam initiated at 2:49 PM.  Appropriate orders placed.  HUDSEN FEI was informed that the remainder of the evaluation will be completed by another provider, this initial triage assessment does not replace that evaluation, and the importance of remaining in the ED until their evaluation is complete.     Marquette Old, PA-C 09/18/22 1450

## 2022-09-18 NOTE — H&P (Signed)
History and Physical    Chief Complaint: SOB   HISTORY OF PRESENT ILLNESS: George Bolton is an 81 y.o. male  seen for SOB that started today upon waking, assoc with LE edema, ROS is otherwise negative. Per report pt has missed doses of his diuretics, pt was last D/C on 9/22 for CHF exacerbation. EKG today shows  atrial fibrillation in I/Avl and V5,V6.    Pt has PMH as below: Past Medical History:  Diagnosis Date   Acute embolic stroke ([place]) 46/65/9935   Chronic atrial fibrillation ([place])    a. Afib dates back to at least 2002 when reviewing prior EKG with EKGs from 2014 onward showing Afib; b. CHADS2VASc at least 4 (CHF, HTN, age x 2)   Chronic diastolic CHF (congestive heart failure) ([place])    a. TTE 5/19: EF 70-75%, mod LVH, near complete obliteration of the mid and apical LV cavity (can be seen in apical hypertrophic CM), mildly dilated LA, RV cavity size nl, RV wall thickness nl, RVSF mildly reduced, mildly dilated RA; b. 02/2018 Limited echo w/ definity: EF 50-65%, no apical hypertrophic CM.   Community acquired bilateral lower lobe pneumonia 11/23/2021   Goals of care, counseling/discussion 12/11/2021   Gout    Hypertension    Hypertensive emergency 12/10/2021   Hypertensive urgency 07/16/2022   Review of Systems  Respiratory:  Positive for shortness of breath.   Cardiovascular:  Positive for leg swelling.  All other systems reviewed and are negative.   Allergies  Allergen Reactions   Cortisone Anaphylaxis   Triamcinolone     Other reaction(s): Unknown    Past Surgical History:  Procedure Laterality Date   CHOLECYSTECTOMY N/A 01/13/2017   Procedure: LAPAROSCOPIC CHOLECYSTECTOMY;  Surgeon: Jules Husbands, MD;  Location: [facility] ORS;  Service: General;  Laterality: N/A;     Social History   Socioeconomic History   Marital status: Married    Spouse name: Not on file   Number of children: Not on file   Years of education: Not on file   Highest education level: Not on  file  Occupational History   Not on file  Tobacco Use   Smoking status: Never   Smokeless tobacco: Never  Vaping Use   Vaping Use: Never used  Substance and Sexual Activity   Alcohol use: No   Drug use: No   Sexual activity: Never  Other Topics Concern   Not on file  Social History Narrative   Not on file   Social Determinants of Health   Financial Resource Strain: Not on file  Food Insecurity: Not on file  Transportation Needs: Not on file  Physical Activity: Not on file  Stress: Not on file  Social Connections: Not on file     CURRENT MEDS:    Current Facility-Administered Medications (Cardiovascular):    [START ON 09/19/2022] furosemide (LASIX) injection 40 mg   hydrALAZINE (APRESOLINE) injection 5 mg   [START ON 09/19/2022] isosorbide mononitrate (IMDUR) 24 hr tablet 30 mg   metoprolol tartrate (LOPRESSOR) tablet 50 mg   [START ON 09/19/2022] rosuvastatin (CRESTOR) tablet 20 mg  Current Outpatient Medications (Cardiovascular):    cloNIDine (CATAPRES - DOSED IN MG/24 HR) 0.1 mg/24hr patch, Place 0.1 mg onto the skin once a week.   furosemide (LASIX) 40 MG tablet, Take 40 mg by mouth daily.   hydrALAZINE (APRESOLINE) 100 MG tablet, Take 1 tablet (100 mg total) by mouth 3 (three) times daily.   metoprolol succinate (TOPROL-XL) 100 MG 24 hr  tablet, Take 100 mg by mouth daily. Take with or immediately following a meal.   rosuvastatin (CRESTOR) 20 MG tablet, Take 1 tablet (20 mg total) by mouth daily.   cloNIDine (CATAPRES) 0.2 MG tablet, Take 0.1 mg by mouth 2 (two) times daily.   furosemide (LASIX) 20 MG tablet, Take 1 tablet (20 mg total) by mouth daily. Hold till 12/16/21 and restart on 12/17/21 (Patient taking differently: Take 20 mg by mouth daily as needed for fluid.)   isosorbide mononitrate (IMDUR) 30 MG 24 hr tablet, Take 30 mg by mouth 2 (two) times daily.   metoprolol tartrate (LOPRESSOR) 50 MG tablet, Take 1 tablet (50 mg total) by mouth 2 (two) times  daily.  Current Facility-Administered Medications (Respiratory):    albuterol (PROVENTIL) (2.5 MG/3ML) 0.083% nebulizer solution 2.5 mg   Current Facility-Administered Medications (Analgesics):    acetaminophen (TYLENOL) tablet 650 mg **OR** acetaminophen (TYLENOL) suppository 650 mg   [START ON 09/19/2022] allopurinol (ZYLOPRIM) tablet 50 mg   [START ON 09/19/2022] aspirin EC tablet 81 mg   [START ON 09/19/2022] colchicine tablet 0.3 mg  Current Outpatient Medications (Analgesics):    allopurinol (ZYLOPRIM) 100 MG tablet, Take 0.5 tablets (50 mg total) by mouth daily. (Patient taking differently: Take 100 mg by mouth daily.)   colchicine 0.6 MG tablet, Take 0.5 tablets (0.3 mg total) by mouth daily. (Patient taking differently: Take 0.6 mg by mouth daily.)   acetaminophen (TYLENOL) 325 MG tablet, Take 2 tablets (650 mg total) by mouth every 6 (six) hours as needed for mild pain (or Fever >/= 101).  Current Facility-Administered Medications (Hematological):    apixaban (ELIQUIS) tablet 2.5 mg  Current Outpatient Medications (Hematological):    apixaban (ELIQUIS) 2.5 MG TABS tablet, Take 2.5 mg by mouth 2 (two) times daily.  Current Facility-Administered Medications (Other):    bisacodyl (DULCOLAX) EC tablet 5 mg   [START ON 09/19/2022] finasteride (PROSCAR) tablet 5 mg   ondansetron (ZOFRAN) tablet 4 mg **OR** ondansetron (ZOFRAN) injection 4 mg   polyethylene glycol (MIRALAX / GLYCOLAX) packet 17 g   sodium chloride flush (NS) 0.9 % injection 3 mL  Current Outpatient Medications (Other):    famotidine (PEPCID) 20 MG tablet, Take 20 mg by mouth 2 (two) times daily as needed.   finasteride (PROSCAR) 5 MG tablet, Take 1 tablet (5 mg total) by mouth daily.   nystatin cream (MYCOSTATIN), Apply 1 Application topically 4 (four) times daily.   triamcinolone cream (KENALOG) 0.1 %, Apply 1 Application topically 2 (two) times daily. Apply to skin of neck and shoulders twice a day for up to  two weeks.   Multiple Vitamin (MULTIVITAMIN WITH MINERALS) TABS tablet, Take 1 tablet by mouth daily.   Nutritional Supplements (,FEEDING SUPPLEMENT, PROSOURCE PLUS) liquid, Take 30 mLs by mouth 3 (three) times daily between meals.    ED Course: Pt in Ed alert awake hypertensive afebrile O2 sats of 96% on room air. Vitals:   09/18/22 1948 09/18/22 2100 09/18/22 2130 09/18/22 2200  BP:  132/88 115/78 (!) 138/94  Pulse:  100 (!) 110 (!) 57  Resp:  (!) 23 (!) 23 (!) 24  Temp: 98 F (36.7 C)     TempSrc: Oral     SpO2:  96% 91% (!) 89%  Weight:      Height:       No intake/output data recorded. SpO2: (!) 89 % Blood work in ed shows creatinine of 2.45 total bili of 1.4 BNP 1322 normal CBC, flu and  COVID-negative, chest x-ray shows cardiomegaly without any acute infiltrate, EKG shows A-fib 99, T wave inversions in inferior leads,  Results for orders placed or performed during the hospital encounter of 09/18/22 (from the past 48 hour(s))  CBC with Differential     Status: None   Collection Time: 09/18/22  2:41 PM  Result Value Ref Range   WBC 8.3 4.0 - 10.5 K/uL   RBC 4.70 4.22 - 5.81 MIL/uL   Hemoglobin 14.1 13.0 - 17.0 g/dL   HCT 43.1 39.0 - 52.0 %   MCV 91.7 80.0 - 100.0 fL   MCH 30.0 26.0 - 34.0 pg   MCHC 32.7 30.0 - 36.0 g/dL   RDW 15.2 11.5 - 15.5 %   Platelets 154 150 - 400 K/uL   nRBC 0.0 0.0 - 0.2 %   Neutrophils Relative % 71 %   Neutro Abs 5.9 1.7 - 7.7 K/uL   Lymphocytes Relative 16 %   Lymphs Abs 1.3 0.7 - 4.0 K/uL   Monocytes Relative 9 %   Monocytes Absolute 0.8 0.1 - 1.0 K/uL   Eosinophils Relative 3 %   Eosinophils Absolute 0.3 0.0 - 0.5 K/uL   Basophils Relative 1 %   Basophils Absolute 0.0 0.0 - 0.1 K/uL   Immature Granulocytes 0 %   Abs Immature Granulocytes 0.02 0.00 - 0.07 K/uL    Comment: Performed at [facility], [address] [place], [place] [address]  Comprehensive metabolic panel     Status: Abnormal   Collection Time: 09/18/22   2:41 PM  Result Value Ref Range   Sodium 141 135 - 145 mmol/L   Potassium 4.5 3.5 - 5.1 mmol/L   Chloride 111 98 - 111 mmol/L   CO2 22 22 - 32 mmol/L   Glucose, Bld 107 (H) 70 - 99 mg/dL    Comment: Glucose reference range applies only to samples taken after fasting for at least 8 hours.   BUN 35 (H) 8 - 23 mg/dL   Creatinine, Ser 2.45 (H) 0.61 - 1.24 mg/dL   Calcium 9.9 8.9 - 10.3 mg/dL   Total Protein 7.4 6.5 - 8.1 g/dL   Albumin 4.1 3.5 - 5.0 g/dL   AST 27 15 - 41 U/L   ALT 16 0 - 44 U/L   Alkaline Phosphatase 68 38 - 126 U/L   Total Bilirubin 1.4 (H) 0.3 - 1.2 mg/dL   GFR, Estimated 25 (L) >60 mL/min    Comment: (NOTE) Calculated using the CKD-EPI Creatinine Equation (2021)    Anion gap 8 5 - 15    Comment: Performed at [facility], [address] [place], [place] [address]  Brain natriuretic peptide     Status: Abnormal   Collection Time: 09/18/22  2:41 PM  Result Value Ref Range   B Natriuretic Peptide 1,322.0 (H) 0.0 - 100.0 pg/mL    Comment: Performed at [name], [address] [place], [place] [address]  Resp Panel by RT-PCR (Flu A&B, Covid) Anterior Nasal Swab     Status: None   Collection Time: 09/18/22  2:49 PM   Specimen: Anterior Nasal Swab  Result Value Ref Range   SARS Coronavirus 2 by RT PCR NEGATIVE NEGATIVE    Comment: (NOTE) SARS-CoV-2 target nucleic acids are NOT DETECTED.  The SARS-CoV-2 RNA is generally detectable in upper respiratory specimens during the acute phase of infection. The lowest concentration of SARS-CoV-2 viral copies this assay can detect is 138 copies/mL. A negative result does  not preclude SARS-Cov-2 infection and should not be used as the sole basis for treatment or other patient management decisions. A negative result may occur with  improper specimen collection/handling, submission of specimen other than nasopharyngeal swab, presence of viral mutation(s) within the areas targeted by this assay, and  inadequate number of viral copies(<138 copies/mL). A negative result must be combined with clinical observations, patient history, and epidemiological information. The expected result is Negative.  Fact Sheet for Patients:  EntrepreneurPulse.com.au  Fact Sheet for Healthcare Providers:  IncredibleEmployment.be  This test is no t yet approved or cleared by the [place] FDA and  has been authorized for detection and/or diagnosis of SARS-CoV-2 by FDA under an Emergency Use Authorization (EUA). This EUA will remain  in effect (meaning this test can be used) for the duration of the COVID-19 declaration under Section 564(b)(1) of the Act, 21 U.S.C.section 360bbb-3(b)(1), unless the authorization is terminated  or revoked sooner.       Influenza A by PCR NEGATIVE NEGATIVE   Influenza B by PCR NEGATIVE NEGATIVE    Comment: (NOTE) The Xpert Xpress SARS-CoV-2/FLU/RSV plus assay is intended as an aid in the diagnosis of influenza from Nasopharyngeal swab specimens and should not be used as a sole basis for treatment. Nasal washings and aspirates are unacceptable for Xpert Xpress SARS-CoV-2/FLU/RSV testing.  Fact Sheet for Patients: EntrepreneurPulse.com.au  Fact Sheet for Healthcare Providers: IncredibleEmployment.be  This test is not yet approved or cleared by the [place] FDA and has been authorized for detection and/or diagnosis of SARS-CoV-2 by FDA under an Emergency Use Authorization (EUA). This EUA will remain in effect (meaning this test can be used) for the duration of the COVID-19 declaration under Section 564(b)(1) of the Act, 21 U.S.C. section 360bbb-3(b)(1), unless the authorization is terminated or revoked.  Performed at [facility], [place], [place] [address]   Troponin I (High Sensitivity)     Status: Abnormal   Collection Time: 09/18/22  6:01 PM  Result  Value Ref Range   Troponin I (High Sensitivity) 28 (H) <18 ng/L    Comment: (NOTE) Elevated high sensitivity troponin I (hsTnI) values and significant  changes across serial measurements may suggest ACS but many other  chronic and acute conditions are known to elevate hsTnI results.  Refer to the "Links" section for chest pain algorithms and additional  guidance. Performed at [facility], [address] [place], [place] [address]     In Ed pt received  Meds ordered this encounter  Medications   furosemide (LASIX) injection 80 mg   allopurinol (ZYLOPRIM) tablet 50 mg   apixaban (ELIQUIS) tablet 2.5 mg   colchicine tablet 0.3 mg   finasteride (PROSCAR) tablet 5 mg   metoprolol tartrate (LOPRESSOR) tablet 50 mg   isosorbide mononitrate (IMDUR) 24 hr tablet 30 mg   rosuvastatin (CRESTOR) tablet 20 mg   furosemide (LASIX) injection 40 mg   sodium chloride flush (NS) 0.9 % injection 3 mL   OR Linked Order Group    acetaminophen (TYLENOL) tablet 650 mg    acetaminophen (TYLENOL) suppository 650 mg   polyethylene glycol (MIRALAX / GLYCOLAX) packet 17 g   bisacodyl (DULCOLAX) EC tablet 5 mg   OR Linked Order Group    ondansetron (ZOFRAN) tablet 4 mg    ondansetron (ZOFRAN) injection 4 mg   hydrALAZINE (APRESOLINE) injection 5 mg   aspirin EC tablet 81 mg   albuterol (PROVENTIL) (2.5 MG/3ML) 0.083% nebulizer solution 2.5 mg  Unresulted Labs (From admission, onward)     Start     Ordered   09/19/22 0500  Comprehensive metabolic panel  Tomorrow morning,   STAT        09/18/22 2218   09/19/22 0500  CBC  Tomorrow morning,   STAT        09/18/22 2218   09/18/22 2330  Hemoglobin A1c  Once,   AD        09/18/22 2330   09/18/22 2330  T4, free  Once,   AD        09/18/22 2330   09/18/22 2330  TSH  Once,   AD        09/18/22 2330          Admission Imaging : DG Chest 2 View  Result Date: 09/18/2022 CLINICAL DATA:  Shortness of breath EXAM: CHEST - 2 VIEW  COMPARISON:  07/15/2022 FINDINGS: Enlargement of cardiac silhouette. Mediastinal contours and pulmonary vascularity normal. New line Atherosclerotic calcification aorta. Lungs clear. No pulmonary infiltrate, pleural effusion, or pneumothorax. Osseous structures unremarkable. IMPRESSION: Enlargement of cardiac silhouette without acute infiltrate. Aortic Atherosclerosis (ICD10-I70.0). Electronically Signed   By: Lavonia Dana M.D.   On: 09/18/2022 15:23   Physical Examination: Vitals:   09/18/22 1948 09/18/22 2100 09/18/22 2130 09/18/22 2200  BP:  132/88 115/78 (!) 138/94  Pulse:  100 (!) 110 (!) 57  Temp: 98 F (36.7 C)     Resp:  (!) 23 (!) 23 (!) 24  Height:      Weight:      SpO2:  96% 91% (!) 89%  TempSrc: Oral     BMI (Calculated):       Physical Exam Vitals and nursing note reviewed.  Constitutional:      General: He is not in acute distress.    Appearance: Normal appearance. He is not ill-appearing, toxic-appearing or diaphoretic.  HENT:     Head: Normocephalic and atraumatic.     Right Ear: Hearing and external ear normal.     Left Ear: Hearing and external ear normal.     Nose: Nose normal. No nasal deformity.     Mouth/Throat:     Lips: Pink.     Mouth: Mucous membranes are moist.     Tongue: No lesions.     Pharynx: Oropharynx is clear.  Eyes:     Extraocular Movements: Extraocular movements intact.     Pupils: Pupils are equal, round, and reactive to light.  Neck:     Vascular: No carotid bruit.  Cardiovascular:     Rate and Rhythm: Normal rate and regular rhythm.     Pulses: Normal pulses.     Heart sounds: Normal heart sounds.  Pulmonary:     Effort: Pulmonary effort is normal.     Breath sounds: Examination of the right-middle field reveals rhonchi. Examination of the left-middle field reveals rhonchi. Examination of the right-lower field reveals rhonchi. Examination of the left-lower field reveals rhonchi. Rhonchi present.  Abdominal:     General: Bowel sounds  are normal. There is no distension.     Palpations: Abdomen is soft. There is no mass.     Tenderness: There is no abdominal tenderness. There is no guarding.     Hernia: No hernia is present.  Musculoskeletal:     Right lower leg: No edema.     Left lower leg: No edema.  Skin:    General: Skin is warm.  Neurological:     General:  No focal deficit present.     Mental Status: He is alert and oriented to person, place, and time.     Cranial Nerves: Cranial nerves 2-12 are intact.     Motor: Motor function is intact.     Comments: Nonfocal but pt has difficulty with moving left knee due to arthritis.    Psychiatric:        Attention and Perception: Attention normal.        Speech: Speech normal.        Behavior: Behavior normal. Behavior is cooperative.     Assessment and Plan: * SOB (shortness of breath) 2/2 CHF exacerbation.  Supplemental oxygen for goal o2 sat above 90%.   Acute on chronic diastolic (congestive) heart failure [facility]) Patient presenting with shortness of breath. Patient has a history of chronic diastolic congestive heart failure. Most recent echocardiogram showed: IMPRESSIONS:  1. Left ventricular ejection fraction, by estimation, is 60 to 65%. The  left ventricle has normal function. The left ventricle has no regional  wall motion abnormalities. There is moderate concentric left ventricular  hypertrophy. Left ventricular  diastolic parameters are indeterminate.   2. Right ventricular systolic function is normal. The right ventricular  size is normal.   3. Left atrial size was moderately dilated.   4. The mitral valve is normal in structure. No evidence of mitral valve  regurgitation. No evidence of mitral stenosis.   5. The aortic valve is normal in structure. Aortic valve regurgitation is  not visualized. No aortic stenosis is present.   6. The inferior vena cava is normal in size with greater than 50%  respiratory variability, suggesting right atrial  pressure of 3 mmHg.   Will continue patient on Lasix 20 mg IV every 12 for 1 days. Continue with hydralazine, metoprolol, Imdur.  Atrial fibrillation, chronic (HCC) Chronic A-fib.  Continue patient on Eliquis. Continue patient on metoprolol.  History of stroke Patient currently anticoagulated with Eliquis.  Continue with statin therapy. Optimization of blood pressure.  Elevated troponin Mild we will follow attribute to chronic  kidney disease.   Type 2 diabetes mellitus with diabetic nephropathy (HCC) Glycemic protocol.   Acute kidney injury superimposed on chronic kidney disease ([place])  Lab Results  Component Value Date   CREATININE 2.45 (H) 09/18/2022   CREATININE 2.39 (H) 07/17/2022   CREATININE 2.19 (H) 07/16/2022  Mild worsening. Cautious diuresis avoid contrast renally dose all needed medications.   Hypertension Vitals:   09/18/22 1430 09/18/22 1720 09/18/22 1800 09/18/22 1830  BP: (!) 165/115 (!) 176/127 (!) 180/116 (!) 153/106   09/18/22 1900 09/18/22 1930  BP: (!) 170/113 (!) 149/115  We will continue patient on metoprolol, Imdur, hydralazine, clonidine.     DVT prophylaxis:  Eliquis.    Code Status:  Full code    Family Communication:  Guiseppe, Flanagan (Spouse)  [phone]    Disposition Plan:  Home      Consults called:  None   Admission status: Inpatient    Unit/ Expected LOS: Progressive/ 2 days.    Para Skeans MD [facility]  6 PM- 2 AM. Please contact me via secure Chat 6 PM-2 AM. [phone] ( Pager ) To contact the [place] [facility] Attending or Consulting provider [place] or covering provider during after hours [place], for this patient.   Check the care team in [facility] and look for a) attending/consulting [facility] provider listed and b) the [facility] team listed Log into [email or web address] and use [place]'s universal password  to access. If you do not have the password, please contact the hospital operator. Locate the [facility] provider you are looking  for under [facility] and page to a number that you can be directly reached. If you still have difficulty reaching the provider, please page the [facility] (Director on Call) for the Hospitalists listed on amion for assistance. [email or web address] 09/18/2022, 11:10 PM

## 2022-09-18 NOTE — ED Triage Notes (Signed)
Pt endorses shortness of breath that started early this morning. Denies missing any of his fluid medication. Denies chest pain.

## 2022-09-18 NOTE — Assessment & Plan Note (Signed)
Mild we will follow attribute to chronic  kidney disease.

## 2022-09-18 NOTE — ED Triage Notes (Signed)
Urine sent to lab 

## 2022-09-18 NOTE — Assessment & Plan Note (Addendum)
  Lab Results  Component Value Date   CREATININE 2.45 (H) 09/18/2022   CREATININE 2.39 (H) 07/17/2022   CREATININE 2.19 (H) 07/16/2022  Mild worsening. Cautious diuresis avoid contrast renally dose all needed medications.

## 2022-09-18 NOTE — Assessment & Plan Note (Signed)
Glycemic protocol.   

## 2022-09-18 NOTE — ED Notes (Signed)
[facility] MD on Call Dr. Leta Jungling [phone]  Call if any questions or transportation needs

## 2022-09-18 NOTE — Assessment & Plan Note (Signed)
Chronic A-fib.  Continue patient on Eliquis. Continue patient on metoprolol.

## 2022-09-18 NOTE — Assessment & Plan Note (Signed)
2/2 CHF exacerbation.  Supplemental oxygen for goal o2 sat above 90%.

## 2022-09-18 NOTE — Assessment & Plan Note (Addendum)
Patient presenting with shortness of breath. Patient has a history of chronic diastolic congestive heart failure. Most recent echocardiogram showed: IMPRESSIONS:  1. Left ventricular ejection fraction, by estimation, is 60 to 65%. The  left ventricle has normal function. The left ventricle has no regional  wall motion abnormalities. There is moderate concentric left ventricular  hypertrophy. Left ventricular  diastolic parameters are indeterminate.   2. Right ventricular systolic function is normal. The right ventricular  size is normal.   3. Left atrial size was moderately dilated.   4. The mitral valve is normal in structure. No evidence of mitral valve  regurgitation. No evidence of mitral stenosis.   5. The aortic valve is normal in structure. Aortic valve regurgitation is  not visualized. No aortic stenosis is present.   6. The inferior vena cava is normal in size with greater than 50%  respiratory variability, suggesting right atrial pressure of 3 mmHg.   Will continue patient on Lasix 20 mg IV every 12 for 1 days. Continue with hydralazine, metoprolol, Imdur.

## 2022-09-19 ENCOUNTER — Inpatient Hospital Stay
Admit: 2022-09-19 | Discharge: 2022-09-19 | Disposition: A | Discharge status: Home / Self Care | Payer: Medicare (Managed Care) | Attending: Internal Medicine | Admitting: Internal Medicine

## 2022-09-19 DIAGNOSIS — R0602 Shortness of breath: Secondary | ICD-10-CM | POA: Diagnosis not present

## 2022-09-19 LAB — COMPREHENSIVE METABOLIC PANEL
ALT: 14 U/L (ref 0–44)
AST: 25 U/L (ref 15–41)
Albumin: 3.4 g/dL — ABNORMAL LOW (ref 3.5–5.0)
Alkaline Phosphatase: 56 U/L (ref 38–126)
Anion gap: 12 (ref 5–15)
BUN: 34 mg/dL — ABNORMAL HIGH (ref 8–23)
CO2: 19 mmol/L — ABNORMAL LOW (ref 22–32)
Calcium: 9.4 mg/dL (ref 8.9–10.3)
Chloride: 111 mmol/L (ref 98–111)
Creatinine, Ser: 2.3 mg/dL — ABNORMAL HIGH (ref 0.61–1.24)
GFR, Estimated: 27 mL/min — ABNORMAL LOW (ref >60)
Glucose, Bld: 98 mg/dL (ref 70–99)
Potassium: 4 mmol/L (ref 3.5–5.1)
Sodium: 142 mmol/L (ref 135–145)
Total Bilirubin: 1.3 mg/dL — ABNORMAL HIGH (ref 0.3–1.2)
Total Protein: 6.2 g/dL — ABNORMAL LOW (ref 6.5–8.1)

## 2022-09-19 LAB — ECHOCARDIOGRAM COMPLETE
AR max vel: 2.33 cm2
AV Peak grad: 3.3 mmHg
Ao pk vel: 0.91 m/s
Area-P 1/2: 4.8 cm2
Calc EF: 57.2 %
Height: 72 in
S' Lateral: 3.2 cm
Single Plane A2C EF: 56.5 %
Single Plane A4C EF: 57.8 %
Weight: 2720 oz

## 2022-09-19 LAB — CBC
HCT: 40.5 % (ref 39.0–52.0)
Hemoglobin: 13.3 g/dL (ref 13.0–17.0)
MCH: 30.4 pg (ref 26.0–34.0)
MCHC: 32.8 g/dL (ref 30.0–36.0)
MCV: 92.5 fL (ref 80.0–100.0)
Platelets: 141 10*3/uL — ABNORMAL LOW (ref 150–400)
RBC: 4.38 MIL/uL (ref 4.22–5.81)
RDW: 15 % (ref 11.5–15.5)
WBC: 7.8 10*3/uL (ref 4.0–10.5)
nRBC: 0 % (ref 0.0–0.2)

## 2022-09-19 NOTE — ED Notes (Signed)
PT at bedside at this time 

## 2022-09-19 NOTE — Evaluation (Signed)
Occupational Therapy Evaluation Patient Details Name: George Bolton MRN: [id number] DOB: June 08, 1941 Today's Date: 09/19/2022   History of Present Illness Pt is an 81 y.o. male  seen for SOB that started upon waking and associated with LE edema. MD assessment includes: acute on chronic diastolic (congestive) heart failure, SOB, A-fib, AKI, and mildly elevated troponin attributed to CKD.  PMH includes: CVA, CHF, and HTN.   Clinical Impression   George Bolton presents with generalized weakness, limited endurance, elevated BP, and SOB. He comes to [facility] from home, where he lives with his wife in a single-story house, 3 STE. He is a participant in [employer] and attends a [facility] day program every Monday and Wednesday. He has been largely IND in ADLs, with sponge-bathing at baseline. Son and [facility] staff assist with IADLs. During today's evaluation, pt requires extended time and effort for bed mobility, followed by a lengthy rest break prior to standing. His standing balance is poor. SpO2 remains largely in the mid 90s but does drop into upper 80s with bed mobility and OOB movement. Pt states that he normally is able to put on his own socks; he requires Mod-Max A for that today. Pt's son in room and reports that pt insists on managing his own medications but that he frequently drops/misses pills. Son reports that pt attends [facility] program 2 days/week but tends to not do the exercises and activities offered there. Son believes pt does not participate 2/2 fear of falling, pt neither confirms nor denies. Recommend ongoing OT during hospitalization, with HHOT provided post DC either through [facility] or an outside service. Pt could benefit particularly with strategies for medication mgmt and bathing.    Recommendations for follow up therapy are one component of a multi-disciplinary discharge planning process, led by the attending physician.  Recommendations may be updated based on patient status, additional functional criteria and  insurance authorization.   Follow Up Recommendations  Home health OT     Assistance Recommended at Discharge Intermittent Supervision/Assistance  Patient can return home with the following A little help with walking and/or transfers;A little help with bathing/dressing/bathroom;Direct supervision/assist for medications management;Assistance with cooking/housework;Assist for transportation    Functional Status Assessment  Patient has had a recent decline in their functional status and demonstrates the ability to make significant improvements in function in a reasonable and predictable amount of time.  Equipment Recommendations  None recommended by OT    Recommendations for Other Services       Precautions / Restrictions Precautions Precautions: Fall Restrictions Weight Bearing Restrictions: No      Mobility Bed Mobility Overal bed mobility: Needs Assistance Bed Mobility: Supine to Sit, Sit to Supine     Supine to sit: Min assist Sit to supine: Min assist   General bed mobility comments: requires greatly increased time and effort    Transfers Overall transfer level: Needs assistance Equipment used: 2 person hand held assist Transfers: Sit to/from Stand Sit to Stand: From elevated surface, Min assist                  Balance Overall balance assessment: Needs assistance Sitting-balance support: Bilateral upper extremity supported, Feet unsupported Sitting balance-Leahy Scale: Fair   Postural control: Posterior lean, Left lateral lean Standing balance support: Bilateral upper extremity supported, During functional activity Standing balance-Leahy Scale: Poor                             ADL either  performed or assessed with clinical judgement   ADL Overall ADL's : Needs assistance/impaired                     Lower Body Dressing: Moderate assistance Lower Body Dressing Details (indicate cue type and reason): donning socks              Functional mobility during ADLs: Rolling walker (2 wheels) General ADL Comments: close SUPV for safety with OOB mobility     Vision         Perception     Praxis      Pertinent Vitals/Pain Pain Assessment Pain Assessment: No/denies pain     Hand Dominance Right   Extremity/Trunk Assessment Upper Extremity Assessment Upper Extremity Assessment: Generalized weakness   Lower Extremity Assessment Lower Extremity Assessment: Generalized weakness       Communication Communication Communication: No difficulties   Cognition Arousal/Alertness: Awake/alert Behavior During Therapy: WFL for tasks assessed/performed Overall Cognitive Status: Within Functional Limits for tasks assessed                                 General Comments: Pt somewhat argumentative; speaks softly, difficult to hear on occasion; no eye contact     General Comments       Exercises Other Exercises Other Exercises: Educ re: medication mgmt, taking full advantage of programming offered through [facility]; importance of taking medications as scheduled   Shoulder Instructions      Home Living Family/patient expects to be discharged to:: Private residence Living Arrangements: Spouse/significant other Available Help at Discharge: Family;Available 24 hours/day Type of Home: House Home Access: Stairs to enter [employer] of Steps: 3 Entrance Stairs-Rails: Left Home Layout: One level     Bathroom Shower/Tub: Teacher, early years/pre: Standard     Home Equipment: Conservation officer, nature (2 wheels);BSC/3in1;Tub bench   Additional Comments: Pt is a participant in [facility] program      Prior Functioning/Environment Prior Level of Function : Needs assist             Mobility Comments: Mod Ind amb with a RW limited community distances, no fall history but one near fall where family prevented fall after a tripping incident ADLs Comments: Mod Ind with ADLs, uses a tub transfer  bench to bathe and takes sponge baths, sometimes requires assist with LB dressing        OT Problem List: Decreased strength;Decreased activity tolerance;Impaired balance (sitting and/or standing);Decreased coordination;Cardiopulmonary status limiting activity;Increased edema      OT Treatment/Interventions: Self-care/ADL training;Therapeutic exercise;Therapeutic activities;Patient/family education;DME and/or AE instruction;Energy conservation;Balance training    OT Goals(Current goals can be found in the care plan section) Acute Rehab OT Goals Patient Stated Goal: to go home OT Goal Formulation: With patient Time For Goal Achievement: 10/03/22 Potential to Achieve Goals: Good ADL Goals Pt/caregiver will Perform Home Exercise Program: Increased ROM;Increased strength;With Supervision Additional ADL Goal #1: Pt will describe/demonstrate medication mgmt system, with ability to identify that he has taken all prescribed medications each day. Additional ADL Goal #2: Pt will participate in group activities and exercises at [facility] day program each Mon and Wed  OT Frequency: Min 2X/week    Co-evaluation              AM-PAC OT "6 Clicks" Daily Activity     Outcome Measure Help from another person eating meals?: None Help from another person taking care of  personal grooming?: A Little Help from another person toileting, which includes using toliet, bedpan, or urinal?: A Lot Help from another person bathing (including washing, rinsing, drying)?: A Lot Help from another person to put on and taking off regular upper body clothing?: A Little Help from another person to put on and taking off regular lower body clothing?: A Lot 6 Click Score: 16   End of Session    Activity Tolerance: Patient tolerated treatment well Patient left: in bed;with call bell/phone within reach;with family/visitor present  OT Visit Diagnosis: Unsteadiness on feet (R26.81)                Time: 0086-7619 OT Time  Calculation (min): 36 min Charges:  OT General Charges $OT Visit: 1 Visit OT Evaluation $OT Eval Moderate Complexity: 1 Mod OT Treatments $Self Care/Home Management : 23-37 mins Josiah Lobo, PhD, MS, OTR/L 09/19/22, 3:34 PM

## 2022-09-19 NOTE — Evaluation (Signed)
Physical Therapy Evaluation Patient Details Name: George Bolton MRN: [id number] DOB: [birth year] Today's Date: 09/19/2022  History of Present Illness  Pt is an 81 y.o. male  seen for SOB that started upon waking and associated with LE edema. MD assessment includes: acute on chronic diastolic (congestive) heart failure, SOB, A-fib, AKI, and mildly elevated troponin attributed to CKD.  PMH includes: CVA, CHF, and HTN.   Clinical Impression  Pt was pleasant and motivated to participate during the session and put forth good effort throughout. Pt required physical assistance with all functional tasks and presented with instability in standing and during ambulation that required occasional min A to prevent LOB.  Pt reported no adverse symptoms during the session with SpO2 and HR WNL on room air.  Pt has limited physical assistance available at home and is at a high risk for falling.  Pt will benefit from PT services in a SNF setting upon discharge to safely address deficits listed in patient problem list for decreased caregiver assistance and eventual return to PLOF.         Recommendations for follow up therapy are one component of a multi-disciplinary discharge planning process, led by the attending physician.  Recommendations may be updated based on patient status, additional functional criteria and insurance authorization.  Follow Up Recommendations Skilled nursing-short term rehab (<3 hours/day) Can patient physically be transported by private vehicle: No    Assistance Recommended at Discharge Frequent or constant Supervision/Assistance  Patient can return home with the following  A little help with walking and/or transfers;A little help with bathing/dressing/bathroom;Assistance with cooking/housework;Help with stairs or ramp for entrance    Equipment Recommendations Rolling walker (2 wheels) (Pt states his RW is in poor condition)  Recommendations for Other Services       Functional  Status Assessment Patient has had a recent decline in their functional status and demonstrates the ability to make significant improvements in function in a reasonable and predictable amount of time.     Precautions / Restrictions Precautions Precautions: Fall Restrictions Weight Bearing Restrictions: No      Mobility  Bed Mobility Overal bed mobility: Needs Assistance Bed Mobility: Supine to Sit, Sit to Supine     Supine to sit: Min assist Sit to supine: Min assist   General bed mobility comments: Min A for BLE control    Transfers Overall transfer level: Needs assistance Equipment used: Rolling walker (2 wheels) Transfers: Sit to/from Stand Sit to Stand: From elevated surface, Min assist           General transfer comment: Mod verbal cues for hand placement, min A for stability upon initial stand    Ambulation/Gait Ambulation/Gait assistance: Min assist Gait Distance (Feet): 10 Feet Assistive device: Rolling walker (2 wheels) Gait Pattern/deviations: Step-through pattern, Decreased step length - right, Decreased step length - left, Trunk flexed, Shuffle Gait velocity: decreased     General Gait Details: Min A for stability with mod lean on the RW for support and only able to amb 10 feet with slow, shuffling steps  Stairs            Wheelchair Mobility    Modified Rankin (Stroke Patients Only)       Balance Overall balance assessment: Needs assistance Sitting-balance support: Bilateral upper extremity supported, Feet supported Sitting balance-Leahy Scale: Fair     Standing balance support: Bilateral upper extremity supported, During functional activity, Reliant on assistive device for balance Standing balance-Leahy Scale: Poor Standing balance comment: Min A for  stability in standing                             Pertinent Vitals/Pain Pain Assessment Pain Assessment: No/denies pain    Home Living Family/patient expects to be  discharged to:: Private residence Living Arrangements: Spouse/significant other Available Help at Discharge: Family;Available 24 hours/day Type of Home: House Home Access: Stairs to enter Entrance Stairs-Rails: Left Entrance Stairs-Number of Steps: 3   Home Layout: One level Home Equipment: Conservation officer, nature (2 wheels);BSC/3in1;Tub bench      Prior Function Prior Level of Function : Independent/Modified Independent             Mobility Comments: Mod Ind amb with a RW limited community distances, no fall history but one near fall where family prevented fall after a tripping incident ADLs Comments: Mod Ind with ADLs, uses a tub transfer bench to bathe     Hand Dominance   Dominant Hand: Right    Extremity/Trunk Assessment   Upper Extremity Assessment Upper Extremity Assessment: Generalized weakness    Lower Extremity Assessment Lower Extremity Assessment: Generalized weakness       Communication   Communication: No difficulties  Cognition Arousal/Alertness: Awake/alert Behavior During Therapy: WFL for tasks assessed/performed Overall Cognitive Status: Within Functional Limits for tasks assessed                                          General Comments      Exercises Total Joint Exercises Ankle Circles/Pumps: AROM, Strengthening, Both, 10 reps Quad Sets: Strengthening, Both, 10 reps Gluteal Sets: Strengthening, Both, 10 reps Hip ABduction/ADduction: AAROM, Strengthening, Both, 5 reps Straight Leg Raises: AAROM, Strengthening, Both, 5 reps Marching in Standing: Strengthening, Both, 5 reps, Standing Other Exercises Other Exercises: HEP education for BLE APs, QS, and GS x 10 each every 1-2 hours daily   Assessment/Plan    PT Assessment Patient needs continued PT services  PT Problem List Decreased strength;Decreased activity tolerance;Decreased balance;Decreased mobility;Decreased knowledge of use of DME       PT Treatment Interventions DME  instruction;Gait training;Stair training;Functional mobility training;Therapeutic activities;Therapeutic exercise;Balance training;Patient/family education    PT Goals (Current goals can be found in the Care Plan section)  Acute Rehab PT Goals Patient Stated Goal: To get stronger PT Goal Formulation: With patient Time For Goal Achievement: 10/02/22 Potential to Achieve Goals: Good    Frequency Min 2X/week     Co-evaluation               AM-PAC PT "6 Clicks" Mobility  Outcome Measure Help needed turning from your back to your side while in a flat bed without using bedrails?: A Little Help needed moving from lying on your back to sitting on the side of a flat bed without using bedrails?: A Little Help needed moving to and from a bed to a chair (including a wheelchair)?: A Little Help needed standing up from a chair using your arms (e.g., wheelchair or bedside chair)?: A Little Help needed to walk in hospital room?: A Lot Help needed climbing 3-5 steps with a railing? : A Lot 6 Click Score: 16    End of Session Equipment Utilized During Treatment: Gait belt Activity Tolerance: Patient tolerated treatment well Patient left: in bed;with call bell/phone within reach Nurse Communication: Mobility status PT Visit Diagnosis: Unsteadiness on feet (R26.81);Difficulty in walking,  not elsewhere classified (R26.2);Muscle weakness (generalized) (M62.81)    Time: 1031-5945 PT Time Calculation (min) (ACUTE ONLY): 32 min   Charges:   PT Evaluation $PT Eval Moderate Complexity: 1 Mod PT Treatments $Therapeutic Exercise: 8-22 mins        D. Scott Siriyah Ambrosius PT, DPT 09/19/22, 2:05 PM

## 2022-09-19 NOTE — ED Notes (Addendum)
Pt had a 9-10 run of V-Tach, BP normal, and pt is currently sleeping. Dr. Billie Ruddy, paged and notified at this. No new orders at this time.

## 2022-09-19 NOTE — ED Notes (Signed)
Pt ambulated with walker to toilet in room. Pt had slow/steady gait with walker. Required assistance going from sitting to standing position from toilet.

## 2022-09-19 NOTE — Progress Notes (Signed)
  Echocardiogram 2D Echocardiogram has been performed.  Claretta Fraise 09/19/2022, 12:49 PM

## 2022-09-19 NOTE — ED Notes (Signed)
Pt repositioned in the bed at this time. Breakfast meal tray given. Assisted pt with his breakfast at this time.

## 2022-09-19 NOTE — Progress Notes (Signed)
  PROGRESS NOTE    George Bolton  ZYS:[id number] DOB: 1941-01-13 DOA: 09/18/2022 PCP: Janna Arch, MD  [id number]  LOS: 1 day   Brief hospital course:   Assessment & Plan: George Bolton is a 81 y.o.  male with medical history significant for chronic atrial fibrillation, chronic diastolic CHF, and hypertension,  who presented with dyspnea.     * SOB (shortness of breath) --no hypoxia or obvious signs of fluid overload.  May have mild dCHF exacerbation due to missing diuretics at home.   Acute on chronic diastolic (congestive) heart failure (HCC) --received IV lasix 80 and 40 x2 --resume home diuretic at discharge   Atrial fibrillation, chronic ([place]) Continue patient on Eliquis. Continue patient on metoprolol.   History of stroke --cont statin   Elevated troponin Trop 28, which is pt's baseline   Acute kidney injury, ruled out  CKD 4   Hypertension --continue metoprolol, Imdur   DVT prophylaxis: XN:ATFTDDU Code Status: Full code  Family Communication: updated pt's [facility] physician on the phone today Level of care: Progressive Dispo:   The patient is from: home Anticipated d/c is to: home Anticipated d/c date is: tomorrow   Subjective and Interval History:  Pt said maybe he is breathing better.     Objective: Vitals:   09/19/22 1200 09/19/22 1300 09/19/22 1400 09/19/22 1500  BP: (!) 142/95 (!) 151/95 (!) 157/104 129/89  Pulse: 97 92 (!) 112 78  Resp: (!) 22 20 (!) 23 (!) 24  Temp:      TempSrc:      SpO2: 95% 97% 96% 93%  Weight:      Height:        Intake/Output Summary (Last 24 hours) at 09/19/2022 1521 Last data filed at 09/19/2022 1405 Gross per 24 hour  Intake 3 ml  Output 1850 ml  Net -1847 ml   Filed Weights   09/18/22 1431  Weight: 77.1 kg    Examination:   Constitutional: NAD, alert, sitting at edge of bed HEENT: conjunctivae and lids normal, EOMI CV: No cyanosis.   RESP: normal respiratory effort, on RA Extremities: No  obvious edema in BLE SKIN: warm, dry Neuro: II - XII grossly intact.     Data Reviewed: I have personally reviewed labs and imaging studies  Time spent: 50 minutes  Enzo Bi, MD [facility] If 7PM-7AM, please contact night-coverage 09/19/2022, 3:21 PM

## 2022-09-20 MED ORDER — METOPROLOL SUCCINATE ER 100 MG PO TB24
100.0000 mg | ORAL_TABLET | Freq: Every day | ORAL | Status: DC
  Administered 2022-09-20: 100 mg via ORAL
  Filled 2022-09-20: qty 1

## 2022-09-20 MED ORDER — ALLOPURINOL 100 MG PO TABS: 100.0000 mg | ORAL_TABLET | Freq: Every day | ORAL | Status: AC

## 2022-09-20 MED ORDER — ALBUTEROL SULFATE (2.5 MG/3ML) 0.083% IN NEBU: 2.5000 mg | INHALATION_SOLUTION | Freq: Four times a day (QID) | RESPIRATORY_TRACT | Status: DC | PRN

## 2022-09-20 MED ORDER — FUROSEMIDE 10 MG/ML IJ SOLN
40.0000 mg | Freq: Once | INTRAMUSCULAR | Status: AC
  Administered 2022-09-20: 40 mg via INTRAVENOUS
  Filled 2022-09-20: qty 4

## 2022-09-20 MED ORDER — METOPROLOL SUCCINATE ER 100 MG PO TB24: 100.0000 mg | ORAL_TABLET | Freq: Every evening | ORAL | Status: DC

## 2022-09-20 NOTE — Progress Notes (Signed)
Physical Therapy Treatment Patient Details Name: George Bolton MRN: [id number] DOB: 01/11/1941 Today's Date: 09/20/2022   History of Present Illness Pt is an 81 y.o. male  seen for SOB that started upon waking and associated with LE edema. MD assessment includes: acute on chronic diastolic (congestive) heart failure, SOB, A-fib, AKI, and mildly elevated troponin attributed to CKD.  PMH includes: CVA, CHF, and HTN.    PT Comments    Seen this AM per request of MD in hopes of discharge home.  Pt struggles to get to EOB with min a x 1 and increased time and assist.  Sitting with frequent post LOB and needs verbal and tactile cues to correct.  Takes 2 attempts to stand from elevated bed to RW.  1st stand pt with heavy forward lean on walker.  He struggles to take several small steps in place and doesn't ever really clear hips.  Short seated rest to allow for recliner to be positioned closer to bed.  He is able to stand again with min a +1 and takes several short shuffling sidesteps to chair.  Remains up in chair to eat.  Dicussed with MD.  Per chart, pt was able to walk to bathroom yesterday with nursing and a short distance with PT.  Today he seemed to struggle more.  Stated he lives with wife who is unable to help him with walking.  Will continue with SNF recommendations.   Recommendations for follow up therapy are one component of a multi-disciplinary discharge planning process, led by the attending physician.  Recommendations may be updated based on patient status, additional functional criteria and insurance authorization.  Follow Up Recommendations  Skilled nursing-short term rehab (<3 hours/day) Can patient physically be transported by private vehicle: No   Assistance Recommended at Discharge Frequent or constant Supervision/Assistance  Patient can return home with the following A little help with walking and/or transfers;A little help with bathing/dressing/bathroom;Assistance with  cooking/housework;Help with stairs or ramp for entrance   Equipment Recommendations  Rolling walker (2 wheels) (Pt states his RW is in poor condition)    Recommendations for Other Services       Precautions / Restrictions Precautions Precautions: Fall Restrictions Weight Bearing Restrictions: No     Mobility  Bed Mobility Overal bed mobility: Needs Assistance Bed Mobility: Supine to Sit     Supine to sit: Min assist     General bed mobility comments: requires greatly increased time and effort    Transfers Overall transfer level: Needs assistance Equipment used: Rolling walker (2 wheels) Transfers: Sit to/from Stand Sit to Stand: Min assist, Mod assist, From elevated surface                Ambulation/Gait Ambulation/Gait assistance: Min assist Gait Distance (Feet): 3 Feet Assistive device: Rolling walker (2 wheels) Gait Pattern/deviations: Step-through pattern, Decreased step length - right, Decreased step length - left, Trunk flexed, Shuffle Gait velocity: decreased     General Gait Details: difficulty stepping to chair   Stairs             Wheelchair Mobility    Modified Rankin (Stroke Patients Only)       Balance Overall balance assessment: Needs assistance Sitting-balance support: Bilateral upper extremity supported, Feet unsupported Sitting balance-Leahy Scale: Fair Sitting balance - Comments: frequent post LOB requiring verbal and tactile cues to sit upright. Postural control: Posterior lean Standing balance support: Bilateral upper extremity supported, During functional activity Standing balance-Leahy Scale: Poor Standing balance comment: Min A  for stability in standing                            Cognition Arousal/Alertness: Awake/alert Behavior During Therapy: WFL for tasks assessed/performed Overall Cognitive Status: Within Functional Limits for tasks assessed                                           Exercises      General Comments        Pertinent Vitals/Pain Pain Assessment Pain Assessment: No/denies pain    Home Living                          Prior Function            PT Goals (current goals can now be found in the care plan section) Progress towards PT goals: Progressing toward goals    Frequency    Min 2X/week      PT Plan      Co-evaluation              AM-PAC PT "6 Clicks" Mobility   Outcome Measure  Help needed turning from your back to your side while in a flat bed without using bedrails?: A Little Help needed moving from lying on your back to sitting on the side of a flat bed without using bedrails?: A Little Help needed moving to and from a bed to a chair (including a wheelchair)?: A Little Help needed standing up from a chair using your arms (e.g., wheelchair or bedside chair)?: A Little Help needed to walk in hospital room?: A Lot Help needed climbing 3-5 steps with a railing? : Total 6 Click Score: 15    End of Session Equipment Utilized During Treatment: Gait belt Activity Tolerance: Patient tolerated treatment well Patient left: in bed;with call bell/phone within reach Nurse Communication: Mobility status PT Visit Diagnosis: Unsteadiness on feet (R26.81);Difficulty in walking, not elsewhere classified (R26.2);Muscle weakness (generalized) (M62.81)     Time: 9741-6384 PT Time Calculation (min) (ACUTE ONLY): 17 min  Charges:  $Therapeutic Activity: 8-22 mins                    Chesley Noon, PTA 09/20/22, 9:58 AM

## 2022-09-20 NOTE — Discharge Summary (Signed)
Physician Discharge Summary   George Bolton  male DOB: [birth year]  EVO:[id number]  PCP: Janna Arch, MD  Admit date: 09/18/2022 Discharge date: 09/20/2022  Admitted From: home Disposition:  home (pt and family declined SNF rehab). [facility] physician updated on the phone prior to discharge. Home Health: Yes CODE STATUS: Full code   Hospital Course:  For full details, please see H&P, progress notes, consult notes and ancillary notes.  Briefly,  George Bolton is a 81 y.o.  male with medical history significant for chronic atrial fibrillation, chronic diastolic CHF, and hypertension, who presented with dyspnea.    Pt was just recently discharged on 07/17/22 after being treated for CHF exacerbation with IV lasix.  Pt likely was non-compliant with his medication including diuretics, as son found some pills underneath the sofa cushion.    * SOB (shortness of breath) --no hypoxia or obvious signs of fluid overload.  May have mild dCHF exacerbation due to missing diuretics at home.   Acute on chronic diastolic (congestive) heart failure (HCC) --received IV lasix 80 and 40 x2 --resume home lasix 40 mg daily at discharge   Atrial fibrillation, chronic ([place]) Continue patient on Eliquis. Continue patient on metoprolol.   History of stroke --cont statin   Elevated troponin Trop 28, which is pt's baseline   Acute kidney injury, ruled out   CKD 4   Hypertension --continue metoprolol, Imdur, clonidine patch, lasix, hyralazine   Unless noted above, medications under "STOP" list are ones pt was not taking PTA.  Discharge Diagnoses:  Principal Problem:   SOB (shortness of breath) Active Problems:   Acute on chronic diastolic (congestive) heart failure (HCC)   Hypertension   Acute kidney injury superimposed on chronic kidney disease (HCC)   Type 2 diabetes mellitus with diabetic nephropathy (HCC)   Elevated troponin   History of stroke   Atrial fibrillation, chronic  ([place])   30 Day Unplanned Readmission Risk Score    Flowsheet Row ED to Hosp-Admission (Current) from 09/18/2022 in [facility]  30 Day Unplanned Readmission Risk Score (%) 14.34 Filed at 09/20/2022 1200       This score is the patient's risk of an unplanned readmission within 30 days of being discharged (0 -100%). The score is based on dignosis, age, lab data, medications, orders, and past utilization.   Low:  0-14.9   Medium: 15-21.9   High: 22-29.9   Extreme: 30 and above         Discharge Instructions:  Allergies as of 09/20/2022       Reactions   Cortisone Anaphylaxis   Triamcinolone    Other reaction(s): Unknown        Medication List     STOP taking these medications    cloNIDine 0.2 MG tablet Commonly known as: CATAPRES   colchicine 0.6 MG tablet   metoprolol tartrate 50 MG tablet Commonly known as: LOPRESSOR   triamcinolone cream 0.1 % Commonly known as: KENALOG       TAKE these medications    (feeding supplement) PROSource Plus liquid Take 30 mLs by mouth 3 (three) times daily between meals.   acetaminophen 325 MG tablet Commonly known as: TYLENOL Take 2 tablets (650 mg total) by mouth every 6 (six) hours as needed for mild pain (or Fever >/= 101).   allopurinol 100 MG tablet Commonly known as: ZYLOPRIM Take 1 tablet (100 mg total) by mouth daily. Home med. What changed:  how much to take additional  instructions   apixaban 2.5 MG Tabs tablet Commonly known as: ELIQUIS Take 2.5 mg by mouth 2 (two) times daily.   cloNIDine 0.1 mg/24hr patch Commonly known as: CATAPRES - Dosed in mg/24 hr Place 0.1 mg onto the skin once a week.   famotidine 20 MG tablet Commonly known as: PEPCID Take 20 mg by mouth 2 (two) times daily as needed.   finasteride 5 MG tablet Commonly known as: PROSCAR Take 1 tablet (5 mg total) by mouth daily.   furosemide 40 MG tablet Commonly known as: LASIX Take 40 mg by mouth daily. What  changed: Another medication with the same name was removed. Continue taking this medication, and follow the directions you see here.   hydrALAZINE 100 MG tablet Commonly known as: APRESOLINE Take 1 tablet (100 mg total) by mouth 3 (three) times daily.   isosorbide mononitrate 30 MG 24 hr tablet Commonly known as: IMDUR Take 30 mg by mouth 2 (two) times daily.   metoprolol succinate 100 MG 24 hr tablet Commonly known as: TOPROL-XL Take 1 tablet (100 mg total) by mouth every evening. Take with or immediately following a meal.  Home med. What changed:  when to take this additional instructions   multivitamin with minerals Tabs tablet Take 1 tablet by mouth daily.   nystatin cream Commonly known as: MYCOSTATIN Apply 1 Application topically 4 (four) times daily.   rosuvastatin 20 MG tablet Commonly known as: Crestor Take 1 tablet (20 mg total) by mouth daily.          Allergies  Allergen Reactions   Cortisone Anaphylaxis   Triamcinolone     Other reaction(s): Unknown     The results of significant diagnostics from this hospitalization (including imaging, microbiology, ancillary and laboratory) are listed below for reference.   Consultations:   Procedures/Studies: ECHOCARDIOGRAM COMPLETE  Result Date: 09/19/2022    ECHOCARDIOGRAM REPORT   Patient Name:   George Bolton Date of Exam: 09/19/2022 Medical Rec #:  [id number]       Height:       72.0 in Accession #:    [id number]      Weight:       170.0 lb Date of Birth:  19-Aug-1941       BSA:          1.988 m Patient Age:    81 years        BP:           153/122 mmHg Patient Gender: M               HR:           96 bpm. Exam Location:  [facility] Procedure: 2D Echo Indications:     CHF I50.21  History:         Patient has prior history of Echocardiogram examinations, most                  recent 11/22/2021.  Sonographer:     Kathlen Brunswick RDCS Referring Phys:  [place] Diagnosing Phys: Serafina Royals MD IMPRESSIONS  1.  Left ventricular ejection fraction, by estimation, is 60 to 65%. The left ventricle has normal function. The left ventricle has no regional wall motion abnormalities. Left ventricular diastolic parameters are consistent with Grade I diastolic dysfunction (impaired relaxation).  2. Right ventricular systolic function is normal. The right ventricular size is normal.  3. The mitral valve is normal in structure. Mild mitral valve regurgitation.  4.  The aortic valve is normal in structure. Aortic valve regurgitation is trivial. FINDINGS  Left Ventricle: Left ventricular ejection fraction, by estimation, is 60 to 65%. The left ventricle has normal function. The left ventricle has no regional wall motion abnormalities. The left ventricular internal cavity size was normal in size. There is  no left ventricular hypertrophy. Left ventricular diastolic parameters are consistent with Grade I diastolic dysfunction (impaired relaxation). Right Ventricle: The right ventricular size is normal. No increase in right ventricular wall thickness. Right ventricular systolic function is normal. Left Atrium: Left atrial size was normal in size. Right Atrium: Right atrial size was normal in size. Pericardium: There is no evidence of pericardial effusion. Mitral Valve: The mitral valve is normal in structure. Mild mitral valve regurgitation. Tricuspid Valve: The tricuspid valve is normal in structure. Tricuspid valve regurgitation is mild. Aortic Valve: The aortic valve is normal in structure. Aortic valve regurgitation is trivial. Aortic valve peak gradient measures 3.3 mmHg. Pulmonic Valve: The pulmonic valve was normal in structure. Pulmonic valve regurgitation is trivial. Aorta: The aortic root and ascending aorta are structurally normal, with no evidence of dilitation. IAS/Shunts: No atrial level shunt detected by color flow Doppler.  LEFT VENTRICLE PLAX 2D LVIDd:         4.40 cm     Diastology LVIDs:         3.20 cm     LV e' medial:    95.90 cm/s LV PW:         1.60 cm     LV E/e' medial: 1.1 LV IVS:        1.70 cm LVOT diam:     2.00 cm LV SV:         30 LV SV Index:   15 LVOT Area:     3.14 cm  LV Volumes (MOD) LV vol d, MOD A2C: 66.4 ml LV vol d, MOD A4C: 57.1 ml LV vol s, MOD A2C: 28.9 ml LV vol s, MOD A4C: 24.1 ml LV SV MOD A2C:     37.5 ml LV SV MOD A4C:     57.1 ml LV SV MOD BP:      35.7 ml RIGHT VENTRICLE RV Basal diam:  2.90 cm RV S prime:     8.27 cm/s LEFT ATRIUM             Index        RIGHT ATRIUM           Index LA diam:        4.40 cm 2.21 cm/m   RA Area:     19.70 cm LA Vol (A2C):   60.0 ml 30.18 ml/m  RA Volume:   51.20 ml  25.75 ml/m LA Vol (A4C):   60.4 ml 30.38 ml/m LA Biplane Vol: 65.6 ml 32.99 ml/m  AORTIC VALVE                 PULMONIC VALVE AV Area (Vmax): 2.33 cm     PV Vmax:          0.75 m/s AV Vmax:        91.40 cm/s   PV Peak grad:     2.3 mmHg AV Peak Grad:   3.3 mmHg     PR End Diast Vel: 16.65 msec LVOT Vmax:      67.90 cm/s LVOT Vmean:     42.200 cm/s LVOT VTI:       0.096 m  AORTA Ao Root diam:  3.50 cm Ao Asc diam:  2.80 cm MITRAL VALVE                TRICUSPID VALVE MV Area (PHT): 4.80 cm     TR Peak grad:   30.9 mmHg MV Decel Time: 158 msec     TR Vmax:        278.00 cm/s MV E velocity: 101.00 cm/s                             SHUNTS                             Systemic VTI:  0.10 m                             Systemic Diam: 2.00 cm Serafina Royals MD Electronically signed by Serafina Royals MD Signature Date/Time: 09/19/2022/7:56:41 PM    Final    DG Chest 2 View  Result Date: 09/18/2022 CLINICAL DATA:  Shortness of breath EXAM: CHEST - 2 VIEW COMPARISON:  07/15/2022 FINDINGS: Enlargement of cardiac silhouette. Mediastinal contours and pulmonary vascularity normal. New line Atherosclerotic calcification aorta. Lungs clear. No pulmonary infiltrate, pleural effusion, or pneumothorax. Osseous structures unremarkable. IMPRESSION: Enlargement of cardiac silhouette without acute infiltrate. Aortic  Atherosclerosis (ICD10-I70.0). Electronically Signed   By: Lavonia Dana M.D.   On: 09/18/2022 15:23      Labs: BNP (last 3 results) Recent Labs    12/10/21 1626 07/16/22 0419 09/18/22 1441  BNP 1,650.5* 1,138.3* 7,989.2*   Basic Metabolic Panel: Recent Labs  Lab 09/18/22 1441 09/19/22 0500  NA 141 142  K 4.5 4.0  CL 111 111  CO2 22 19*  GLUCOSE 107* 98  BUN 35* 34*  CREATININE 2.45* 2.30*  CALCIUM 9.9 9.4   Liver Function Tests: Recent Labs  Lab 09/18/22 1441 09/19/22 0500  AST 27 25  ALT 16 14  ALKPHOS 68 56  BILITOT 1.4* 1.3*  PROT 7.4 6.2*  ALBUMIN 4.1 3.4*   No results for input(s): "LIPASE", "AMYLASE" in the last 168 hours. No results for input(s): "AMMONIA" in the last 168 hours. CBC: Recent Labs  Lab 09/18/22 1441 09/19/22 0500  WBC 8.3 7.8  NEUTROABS 5.9  --   HGB 14.1 13.3  HCT 43.1 40.5  MCV 91.7 92.5  PLT 154 141*   Cardiac Enzymes: No results for input(s): "CKTOTAL", "CKMB", "CKMBINDEX", "TROPONINI" in the last 168 hours. BNP: Invalid input(s): "POCBNP" CBG: No results for input(s): "GLUCAP" in the last 168 hours. D-Dimer No results for input(s): "DDIMER" in the last 72 hours. Hgb A1c No results for input(s): "HGBA1C" in the last 72 hours. Lipid Profile No results for input(s): "CHOL", "HDL", "LDLCALC", "TRIG", "CHOLHDL", "LDLDIRECT" in the last 72 hours. Thyroid function studies Recent Labs    09/18/22 1801  TSH 1.240   Anemia work up No results for input(s): "VITAMINB12", "FOLATE", "FERRITIN", "TIBC", "IRON", "RETICCTPCT" in the last 72 hours. Urinalysis    Component Value Date/Time   COLORURINE STRAW (A) 12/10/2021 1818   APPEARANCEUR CLEAR (A) 12/10/2021 1818   APPEARANCEUR Clear 07/21/2018 0959   LABSPEC 1.010 12/10/2021 1818   LABSPEC 1.014 09/23/2012 1438   PHURINE 6.0 12/10/2021 1818   GLUCOSEU 50 (A) 12/10/2021 1818   GLUCOSEU 50 mg/dL 09/23/2012 1438   HGBUR SMALL (A) 12/10/2021 1818   BILIRUBINUR NEGATIVE  12/10/2021 1818   BILIRUBINUR Negative 07/21/2018 0959   BILIRUBINUR Negative 09/23/2012 [place] 12/10/2021 1818   PROTEINUR 100 (A) 12/10/2021 1818   NITRITE NEGATIVE 12/10/2021 1818   LEUKOCYTESUR TRACE (A) 12/10/2021 1818   LEUKOCYTESUR Negative 09/23/2012 1438   Sepsis Labs Recent Labs  Lab 09/18/22 1441 09/19/22 0500  WBC 8.3 7.8   Microbiology Recent Results (from the past 240 hour(s))  Resp Panel by RT-PCR (Flu A&B, Covid) Anterior Nasal Swab     Status: None   Collection Time: 09/18/22  2:49 PM   Specimen: Anterior Nasal Swab  Result Value Ref Range Status   SARS Coronavirus 2 by RT PCR NEGATIVE NEGATIVE Final    Comment: (NOTE) SARS-CoV-2 target nucleic acids are NOT DETECTED.  The SARS-CoV-2 RNA is generally detectable in upper respiratory specimens during the acute phase of infection. The lowest concentration of SARS-CoV-2 viral copies this assay can detect is 138 copies/mL. A negative result does not preclude SARS-Cov-2 infection and should not be used as the sole basis for treatment or other patient management decisions. A negative result may occur with  improper specimen collection/handling, submission of specimen other than nasopharyngeal swab, presence of viral mutation(s) within the areas targeted by this assay, and inadequate number of viral copies(<138 copies/mL). A negative result must be combined with clinical observations, patient history, and epidemiological information. The expected result is Negative.  Fact Sheet for Patients:  EntrepreneurPulse.com.au  Fact Sheet for Healthcare Providers:  IncredibleEmployment.be  This test is no t yet approved or cleared by the [place] FDA and  has been authorized for detection and/or diagnosis of SARS-CoV-2 by FDA under an Emergency Use Authorization (EUA). This EUA will remain  in effect (meaning this test can be used) for the duration of  the COVID-19 declaration under Section 564(b)(1) of the Act, 21 U.S.C.section 360bbb-3(b)(1), unless the authorization is terminated  or revoked sooner.       Influenza A by PCR NEGATIVE NEGATIVE Final   Influenza B by PCR NEGATIVE NEGATIVE Final    Comment: (NOTE) The Xpert Xpress SARS-CoV-2/FLU/RSV plus assay is intended as an aid in the diagnosis of influenza from Nasopharyngeal swab specimens and should not be used as a sole basis for treatment. Nasal washings and aspirates are unacceptable for Xpert Xpress SARS-CoV-2/FLU/RSV testing.  Fact Sheet for Patients: EntrepreneurPulse.com.au  Fact Sheet for Healthcare Providers: IncredibleEmployment.be  This test is not yet approved or cleared by the [place] FDA and has been authorized for detection and/or diagnosis of SARS-CoV-2 by FDA under an Emergency Use Authorization (EUA). This EUA will remain in effect (meaning this test can be used) for the duration of the COVID-19 declaration under Section 564(b)(1) of the Act, 21 U.S.C. section 360bbb-3(b)(1), unless the authorization is terminated or revoked.  Performed at [facility], [address] [place],  [address]      Total time spend on discharging this patient, including the last patient exam, discussing the hospital stay, instructions for ongoing care as it relates to all pertinent caregivers, as well as preparing the medical discharge records, prescriptions, and/or referrals as applicable, is 45 minutes.    Enzo Bi, MD  [facility] 09/20/2022, 12:26 PM

## 2022-09-20 NOTE — TOC Initial Note (Addendum)
Transition of Care [facility]) - Initial/Assessment Note    Patient Details  Name: George Bolton MRN: [id number] Date of Birth: [birth year]  [facility]) CM/SW Contact:    Magnus Ivan, LCSW Phone Number: 09/20/2022, 10:38 AM  Clinical Narrative:                 Patient is a [facility] participant. PT rec SNF.  CSW spoke with patient who declines SNF but defers to his wife to discuss further. Called patient's wife who stated family provides 33 hour care at home and they agree with patient declining SNF. She stated they would like patient to continue his PT and OT at [facility]. Patient and spouse asked about getting a new RW, they understand this would be ordered by [facility]. CSW called Dr. Felipa Eth with [facility]. She stated patient can continue with PT and OT at [name] and that [facility] will follow up with them tomorrow about getting a new RW for patient. Dr. Felipa Eth stated patient has good support/care at home from wife and son.  Dr. Felipa Eth stated it is ok to arrange EMS transport home per PT recs.  CSW asked MD if patient is medically ready and if there are any medication changes per Dr. Felipa Eth request.   3:45- Patient to DC home today. MD has spoken with [facility] MD [place]. Called son who stated they do need EMS, confirmed address listed in chart. EMS paperwork completed. CSW will call for [facility] when patient is ready for transport.   4:00- [facility] called for transport. Patient is next on the list. RN notified.   Expected Discharge Plan: [place] Barriers to Discharge: Continued Medical Work up   Patient Goals and CMS Choice Patient states their goals for this hospitalization and ongoing recovery are:: home with family, continued [facility] services CMS Medicare.gov Compare Post Acute Care list provided to:: Patient Choice offered to / list presented to : Patient, Spouse  Expected Discharge Plan and Services Expected Discharge Plan: [place]        Living arrangements for the past 2 months: Single Family Home                                      Prior Living Arrangements/Services Living arrangements for the past 2 months: Single Family Home Lives with:: Spouse Patient language and need for interpreter reviewed:: Yes Do you feel safe going back to the place where you live?: Yes      Need for Family Participation in Patient Care: Yes (Comment) Care giver support system in place?: Yes (comment) Current home services: DME, Other (comment) ([facility]) Criminal Activity/Legal Involvement Pertinent to Current Situation/Hospitalization: No - Comment as needed  Activities of Daily Living Home Assistive Devices/Equipment: Walker (specify type) ADL Screening (condition at time of admission) Patient's cognitive ability adequate to safely complete daily activities?: No Is the patient deaf or have difficulty hearing?: No Does the patient have difficulty seeing, even when wearing glasses/contacts?: No Does the patient have difficulty concentrating, remembering, or making decisions?: No Patient able to express need for assistance with ADLs?: No Does the patient have difficulty dressing or bathing?: Yes Independently performs ADLs?: No Communication: Appropriate for developmental age Does the patient have difficulty walking or climbing stairs?: Yes Weakness of Legs: Both Weakness of Arms/Hands: Both  Permission Sought/Granted Permission sought to share information with : Chartered certified accountant granted  to share information with : Yes, Verbal Permission Granted  Share Information with NAME: wife  Permission granted to share info w AGENCY: [facility]        Emotional Assessment       Orientation: : Oriented to Self, Oriented to Place, Oriented to  Time, Oriented to Situation Alcohol / Substance Use: Not Applicable Psych Involvement: No (comment)  Admission diagnosis:  SOB (shortness of breath) [R06.02] Acute on  chronic congestive heart failure, unspecified heart failure type [facility]) [I50.9] Patient Active Problem List   Diagnosis Date Noted   SOB (shortness of breath) 09/18/2022   Acute on chronic diastolic (congestive) heart failure ([place]) 07/16/2022   Atrial fibrillation, chronic ([place]) 07/16/2022   Dyslipidemia 07/16/2022   CKD (chronic kidney disease), stage IV ([place]) 07/16/2022   Left leg swelling    NSVT (nonsustained ventricular tachycardia) ([place]) 12/10/2021   Type 2 diabetes mellitus with diabetic nephropathy ([place]) 12/10/2021   Elevated troponin 12/10/2021   History of stroke 12/10/2021   Osteoarthritis of knee 12/10/2021   Acute kidney injury superimposed on chronic kidney disease ([place]) 11/22/2021   Lymphedema 03/15/2018   Gout 01/25/2017   Hypertension 04/25/2015   PCP:  Janna Arch, MD Pharmacy:   [facility] [address] [place] [address] [place] [address] Phone: [phone] Fax: [facility] [address] [place] - [place] [address] Phone: [phone] Fax: [phone]     Social Determinants of Health (SDOH) Interventions    Readmission Risk Interventions    09/20/2022   10:22 AM  Readmission Risk Prevention Plan  Post Dischage Appt Complete  Medication Screening Complete  Transportation Screening Complete

## 2022-09-21 LAB — HEMOGLOBIN A1C
Hgb A1c MFr Bld: 6.4 % — ABNORMAL HIGH (ref 4.8–5.6)
Mean Plasma Glucose: 137 mg/dL

## 2022-09-27 NOTE — Progress Notes (Deleted)
Patient ID: George Bolton, male    DOB: 1941/07/04, 81 y.o.   MRN: [id number]  HPI  George Bolton is a 81 y/o male with a history of HTN, atrial fibrillation, gout and chronic heart failure.   Echo from 09/19/22 showed an EF of 60-65% along with mild George. Echo report from 11/22/21 reviewed and showed an EF of 60-65% along with moderate LVH/ LAE. Echo report from 03/03/18 reviewed and showed an EF of 60-65%.   Admitted 09/18/22 due to SOB. Pt likely was non-compliant with his medication including diuretics, as son found some pills underneath the sofa cushion. Initially given IV lasix with transition to oral diuretics. Discharged after 2 days.    He presents today for a follow-up visit with a chief complaint of    Past Medical History:  Diagnosis Date   Acute embolic stroke ([place]) 36/14/4315   Chronic atrial fibrillation ([place])    a. Afib dates back to at least 2002 when reviewing prior EKG with EKGs from 2014 onward showing Afib; b. CHADS2VASc at least 4 (CHF, HTN, age x 2)   Chronic diastolic CHF (congestive heart failure) ([place])    a. TTE 5/19: EF 70-75%, mod LVH, near complete obliteration of the mid and apical LV cavity (can be seen in apical hypertrophic CM), mildly dilated LA, RV cavity size nl, RV wall thickness nl, RVSF mildly reduced, mildly dilated RA; b. 02/2018 Limited echo w/ definity: EF 50-65%, no apical hypertrophic CM.   Community acquired bilateral lower lobe pneumonia 11/23/2021   Goals of care, counseling/discussion 12/11/2021   Gout    Hypertension    Hypertensive emergency 12/10/2021   Hypertensive urgency 07/16/2022   Past Surgical History:  Procedure Laterality Date   CHOLECYSTECTOMY N/A 01/13/2017   Procedure: LAPAROSCOPIC CHOLECYSTECTOMY;  Surgeon: Jules Husbands, MD;  Location: [facility] ORS;  Service: General;  Laterality: N/A;   Family History  Problem Relation Age of Onset   Stroke Mother    Social History   Tobacco Use   Smoking status: Never   Smokeless  tobacco: Never  Substance Use Topics   Alcohol use: No   Allergies  Allergen Reactions   Cortisone Anaphylaxis   Triamcinolone     Other reaction(s): Unknown   Past Medical History:  Diagnosis Date   Acute embolic stroke ([place]) 40/05/6760   Chronic atrial fibrillation ([place])    a. Afib dates back to at least 2002 when reviewing prior EKG with EKGs from 2014 onward showing Afib; b. CHADS2VASc at least 4 (CHF, HTN, age x 2)   Chronic diastolic CHF (congestive heart failure) ([place])    a. TTE 5/19: EF 70-75%, mod LVH, near complete obliteration of the mid and apical LV cavity (can be seen in apical hypertrophic CM), mildly dilated LA, RV cavity size nl, RV wall thickness nl, RVSF mildly reduced, mildly dilated RA; b. 02/2018 Limited echo w/ definity: EF 50-65%, no apical hypertrophic CM.   Community acquired bilateral lower lobe pneumonia 11/23/2021   Goals of care, counseling/discussion 12/11/2021   Gout    Hypertension    Hypertensive emergency 12/10/2021   Hypertensive urgency 07/16/2022   Past Surgical History:  Procedure Laterality Date   CHOLECYSTECTOMY N/A 01/13/2017   Procedure: LAPAROSCOPIC CHOLECYSTECTOMY;  Surgeon: Jules Husbands, MD;  Location: [facility] ORS;  Service: General;  Laterality: N/A;   Family History  Problem Relation Age of Onset   Stroke Mother    Social History   Tobacco Use   Smoking status: Never  Smokeless tobacco: Never  Substance Use Topics   Alcohol use: No   Allergies  Allergen Reactions   Cortisone Anaphylaxis   Triamcinolone     Other reaction(s): Unknown    Review of Systems  Constitutional: Positive for malaise/fatigue. Negative for decreased appetite.  HENT:  Negative for congestion, hoarse voice and sore throat.   Eyes: Negative.   Cardiovascular:  Positive for dyspnea on exertion (easily) and leg swelling (L>R). Negative for chest pain and palpitations.  Respiratory:  Positive for shortness of breath. Negative for cough and wheezing.    Endocrine: Negative.   Hematologic/Lymphatic: Negative.   Skin: Negative.   Musculoskeletal:  Negative for back pain and joint pain.  Gastrointestinal:  Negative for bloating and abdominal pain.  Genitourinary: Negative.   Neurological:  Negative for dizziness, light-headedness and weakness.  Psychiatric/Behavioral:  Negative for depression. The patient has insomnia (didn't sleep good last night). The patient is not nervous/anxious.   Allergic/Immunologic: Negative.       Physical Exam Vitals and nursing note reviewed. Exam conducted with a chaperone present (son).  Constitutional:      Appearance: He is well-developed.  HENT:     Head: Normocephalic and atraumatic.  Neck:     Vascular: No JVD.  Cardiovascular:     Rate and Rhythm: Normal rate. Rhythm irregular.  Pulmonary:     Effort: Pulmonary effort is normal. No respiratory distress.     Breath sounds: No wheezing or rales.  Abdominal:     General: There is no distension.     Palpations: Abdomen is soft.  Musculoskeletal:        General: No tenderness.     Cervical back: Normal range of motion and neck supple.     Right lower leg: Edema (1+) present.     Left lower leg: Edema (2+) present.  Skin:    General: Skin is warm and dry.  Neurological:     Mental Status: He is alert and oriented to person, place, and time.  Psychiatric:        Behavior: Behavior normal.    Assessment & Plan:  1: Chronic heart failure with preserved ejection fraction- - NYHA class III - euvolemic today - not weighing daily but has scales; instructed to weigh daily and to call for an overnight weight gain of >2 pounds or a weekly weight gain of >5 pounds - weight 177 pounds from last visit here 9 months ago - not adding salt; Reminded to keep daily sodium intake to 2000mg  a day  - consider GDMT of entresto/ SGLT2 depending on how renal function does - drinking ~ 22 ounces of fluids daily; nephrology said needs to drink 40 ounces -  participating in paramedicine program - BNP 09/18/22 was 1322.0  2: HTN with CKD- - BP  - follows with PCP Posey Pronto at [facility]) and saw them last week - saw nephrology Holley Raring) 12/10/21 - BMP 09/19/22 reviewed and showed sodium 142, potassium 4.0, creatinine 2.3 and GFR 27  3: Lymphedema- - stage 2 - son says that edema improves overnight after legs have been in the bed all night - patient says swelling has improved since recent admission  4: Atrial fibrillation- - saw cardiology Clayborn Bigness) 08/06/22   Patient did not bring his medications nor a list. Each medication was verbally reviewed with the patient and he was encouraged to bring the bottles to every visit to confirm accuracy of list.

## 2022-09-28 ENCOUNTER — Ambulatory Visit: Payer: Medicare (Managed Care) | Admitting: Family

## 2022-09-29 ENCOUNTER — Ambulatory Visit: Payer: Medicare (Managed Care) | Admitting: Family

## 2022-11-24 NOTE — Progress Notes (Unsigned)
11/25/2022 10:55 AM   George Bolton 07-30-1941 [id number]  Urological history 1. BPH with LU TS -I PSS 15/4 -PVR 52 mL -Finasteride 5 mg daily  2. Renal cysts -RUS (10/2021) - multiple cysts in the right kidney largest measuring 5.4 x 5 x 5.6 cm in the upper pole - multiple cysts of varying sizes largest measuring 5.7 x 4.9 x 5.1 cm  3. Hydroceles -scrotal ultrasound (10/2021) - Large left hydrocele - left epididymal cysts   4.  Nephrolithiasis -CT urogram (07/2019) - Multiple bilateral kidney stones without hydronephrosis.    HPI: George Bolton is a 82 y.o. male who presents today for yearly visit with his son, George Bolton.    I PSS 15/4  PVR 52 mL  He has issues with urinary frequency, but this is due to the 40 mg of Lasix he has to take for his CHF.  Patient denies any modifying or aggravating factors.  Patient denies any gross hematuria, dysuria or suprapubic/flank pain.  Patient denies any fevers, chills, nausea or vomiting.     IPSS     Row Name 11/25/22 1000         International Prostate Symptom Score   How often have you had the sensation of not emptying your bladder? Not at All     How often have you had to urinate less than every two hours? About half the time     How often have you found you stopped and started again several times when you urinated? Not at All     How often have you found it difficult to postpone urination? Almost always     How often have you had a weak urinary stream? More than half the time     How often have you had to strain to start urination? Not at All     How many times did you typically get up at night to urinate? 3 Times     Total IPSS Score 15       Quality of Life due to urinary symptoms   If you were to spend the rest of your life with your urinary condition just the way it is now how would you feel about that? Mostly Disatisfied              Score:  1-7 Mild 8-19 Moderate 20-35 Severe    PMH: Past Medical  History:  Diagnosis Date   Acute embolic stroke ([place]) 86/76/1950   Chronic atrial fibrillation ([facility])    a. Afib dates back to at least 2002 when reviewing prior EKG with EKGs from 2014 onward showing Afib; b. CHADS2VASc at least 4 (CHF, HTN, age x 2)   Chronic diastolic CHF (congestive heart failure) ([place])    a. TTE 5/19: EF 70-75%, mod LVH, near complete obliteration of the mid and apical LV cavity (can be seen in apical hypertrophic CM), mildly dilated LA, RV cavity size nl, RV wall thickness nl, RVSF mildly reduced, mildly dilated RA; b. 02/2018 Limited echo w/ definity: EF 50-65%, no apical hypertrophic CM.   Community acquired bilateral lower lobe pneumonia 11/23/2021   Goals of care, counseling/discussion 12/11/2021   Gout    Hypertension    Hypertensive emergency 12/10/2021   Hypertensive urgency 07/16/2022    Surgical History: Past Surgical History:  Procedure Laterality Date   CHOLECYSTECTOMY N/A 01/13/2017   Procedure: LAPAROSCOPIC CHOLECYSTECTOMY;  Surgeon: George Husbands, MD;  Location: [facility] ORS;  Service: General;  Laterality: N/A;  Home Medications:  Allergies as of 11/25/2022       Reactions   Cortisone Anaphylaxis   Triamcinolone    Other reaction(s): Unknown        Medication List        Accurate as of November 25, 2022 10:55 AM. If you have any questions, ask your nurse or doctor.          STOP taking these medications    fexofenadine 180 MG tablet Commonly known as: ALLEGRA Stopped by: George Hallinan, PA-C   fluocinonide ointment 0.05 % Commonly known as: LIDEX Stopped by: George Rosengrant, PA-C       TAKE these medications    (feeding supplement) PROSource Plus liquid Take 30 mLs by mouth 3 (three) times daily between meals.   acetaminophen 325 MG tablet Commonly known as: TYLENOL Take 2 tablets (650 mg total) by mouth every 6 (six) hours as needed for mild pain (or Fever >/= 101).   allopurinol 100 MG tablet Commonly known as:  ZYLOPRIM Take 1 tablet (100 mg total) by mouth daily. Home med.   apixaban 2.5 MG Tabs tablet Commonly known as: ELIQUIS Take 2.5 mg by mouth 2 (two) times daily.   cloNIDine 0.1 mg/24hr patch Commonly known as: CATAPRES - Dosed in mg/24 hr Place 0.1 mg onto the skin once a week.   famotidine 20 MG tablet Commonly known as: PEPCID Take 20 mg by mouth 2 (two) times daily as needed.   finasteride 5 MG tablet Commonly known as: PROSCAR Take 1 tablet (5 mg total) by mouth daily.   furosemide 40 MG tablet Commonly known as: LASIX Take 40 mg by mouth daily.   hydrALAZINE 100 MG tablet Commonly known as: APRESOLINE Take 1 tablet (100 mg total) by mouth 3 (three) times daily.   isosorbide mononitrate 30 MG 24 hr tablet Commonly known as: IMDUR Take 30 mg by mouth 2 (two) times daily.   metoprolol succinate 100 MG 24 hr tablet Commonly known as: TOPROL-XL Take 1 tablet (100 mg total) by mouth every evening. Take with or immediately following a meal.  Home med.   multivitamin with minerals Tabs tablet Take 1 tablet by mouth daily.   nystatin cream Commonly known as: MYCOSTATIN Apply 1 Application topically 4 (four) times daily.   rosuvastatin 20 MG tablet Commonly known as: Crestor Take 1 tablet (20 mg total) by mouth daily.        Allergies:  Allergies  Allergen Reactions   Cortisone Anaphylaxis   Triamcinolone     Other reaction(s): Unknown    Family History: Family History  Problem Relation Age of Onset   Stroke Mother     Social History:   reports that he has never smoked. He has never used smokeless tobacco. He reports that he does not drink alcohol and does not use drugs.  Physical Exam: BP (!) 160/99   Pulse 77   Ht 6' (1.829 m)   Wt 160 lb (72.6 kg)   BMI 21.70 kg/m   Constitutional:  Well nourished. Alert and oriented, No acute distress. HEENT: [place] AT, moist mucus membranes.  Trachea midline Cardiovascular: No clubbing, cyanosis, or  edema. Respiratory: Normal respiratory effort, no increased work of breathing. Neurologic: Grossly intact, no focal deficits, moving all 4 extremities. Psychiatric: Normal mood and affect.   Laboratory Data: Component     Latest Ref Rng 09/19/2022  Hemoglobin A1C     4.8 - 5.6 % 6.4 (H)   Mean Plasma Glucose  mg/dL 137     Legend: (H) High     Latest Ref Rng & Units 09/19/2022    5:00 AM 09/18/2022    2:41 PM 07/17/2022    7:54 AM  CMP  Glucose 70 - 99 mg/dL 98  107  127   BUN 8 - 23 mg/dL 34  35  43   Creatinine 0.61 - 1.24 mg/dL 2.30  2.45  2.39   Sodium 135 - 145 mmol/L 142  141  142   Potassium 3.5 - 5.1 mmol/L 4.0  4.5  4.3   Chloride 98 - 111 mmol/L 111  111  111   CO2 22 - 32 mmol/L 19  22  25    Calcium 8.9 - 10.3 mg/dL 9.4  9.9  9.5   Total Protein 6.5 - 8.1 g/dL 6.2  7.4    Total Bilirubin 0.3 - 1.2 mg/dL 1.3  1.4    Alkaline Phos 38 - 126 U/L 56  68    AST 15 - 41 U/L 25  27    ALT 0 - 44 U/L 14  16      I have reviewed the labs.   Pertinent imaging:  11/25/22 10:17  Scan Result 52     Assessment & Plan:    1. BPH with obstruction/lower urinary tract symptoms - PVR demonstrates adequate emptying -Continue finasteride 5 mg daily  2. Renal cyst -Stable, recommend no further monitoring for these.  3. Kidney stones -Asymptomatic,  4. Hydrocele, unspecified hydrocele type -Stable  Return in about 1 year (around 11/26/2023) for IPSS and PVR.  Gertie Broerman, [place] [address], [place], [place] [address] [phone]

## 2022-11-25 ENCOUNTER — Encounter: Payer: Self-pay | Admitting: Urology

## 2022-11-25 ENCOUNTER — Ambulatory Visit (INDEPENDENT_AMBULATORY_CARE_PROVIDER_SITE_OTHER): Payer: Medicare (Managed Care) | Admitting: Urology

## 2022-11-25 VITALS — BP 160/99 | HR 77 | Ht 72.0 in | Wt 160.0 lb

## 2022-11-25 DIAGNOSIS — N138 Other obstructive and reflux uropathy: Secondary | ICD-10-CM

## 2022-11-25 DIAGNOSIS — N281 Cyst of kidney, acquired: Secondary | ICD-10-CM | POA: Diagnosis not present

## 2022-11-25 DIAGNOSIS — N433 Hydrocele, unspecified: Secondary | ICD-10-CM | POA: Diagnosis not present

## 2022-11-25 DIAGNOSIS — N200 Calculus of kidney: Secondary | ICD-10-CM | POA: Diagnosis not present

## 2022-11-25 DIAGNOSIS — N401 Enlarged prostate with lower urinary tract symptoms: Secondary | ICD-10-CM | POA: Diagnosis not present

## 2022-11-25 LAB — BLADDER SCAN AMB NON-IMAGING: Scan Result: 52

## 2022-11-27 ENCOUNTER — Ambulatory Visit: Payer: Medicare Other | Admitting: Physician Assistant

## 2022-11-30 ENCOUNTER — Ambulatory Visit: Payer: Medicare Other | Admitting: Physician Assistant

## 2023-02-09 ENCOUNTER — Emergency Department: Payer: Medicare (Managed Care)

## 2023-02-09 ENCOUNTER — Emergency Department
Admission: EM | Admit: 2023-02-09 | Discharge: 2023-02-10 | Disposition: A | Discharge status: Home / Self Care | Payer: Medicare (Managed Care) | Attending: Emergency Medicine | Admitting: Emergency Medicine

## 2023-02-09 ENCOUNTER — Other Ambulatory Visit: Payer: Self-pay

## 2023-02-09 DIAGNOSIS — S7001XA Contusion of right hip, initial encounter: Secondary | ICD-10-CM | POA: Insufficient documentation

## 2023-02-09 DIAGNOSIS — Y92009 Unspecified place in unspecified non-institutional (private) residence as the place of occurrence of the external cause: Secondary | ICD-10-CM

## 2023-02-09 DIAGNOSIS — E876 Hypokalemia: Secondary | ICD-10-CM | POA: Insufficient documentation

## 2023-02-09 DIAGNOSIS — M25521 Pain in right elbow: Secondary | ICD-10-CM | POA: Insufficient documentation

## 2023-02-09 DIAGNOSIS — W1839XA Other fall on same level, initial encounter: Secondary | ICD-10-CM | POA: Insufficient documentation

## 2023-02-09 DIAGNOSIS — N3090 Cystitis, unspecified without hematuria: Secondary | ICD-10-CM

## 2023-02-09 DIAGNOSIS — N189 Chronic kidney disease, unspecified: Secondary | ICD-10-CM | POA: Insufficient documentation

## 2023-02-09 DIAGNOSIS — E1122 Type 2 diabetes mellitus with diabetic chronic kidney disease: Secondary | ICD-10-CM | POA: Diagnosis not present

## 2023-02-09 DIAGNOSIS — I129 Hypertensive chronic kidney disease with stage 1 through stage 4 chronic kidney disease, or unspecified chronic kidney disease: Secondary | ICD-10-CM | POA: Diagnosis not present

## 2023-02-09 DIAGNOSIS — Y92019 Unspecified place in single-family (private) house as the place of occurrence of the external cause: Secondary | ICD-10-CM | POA: Diagnosis not present

## 2023-02-09 LAB — BASIC METABOLIC PANEL
Anion gap: 10 (ref 5–15)
BUN: 37 mg/dL — ABNORMAL HIGH (ref 8–23)
CO2: 20 mmol/L — ABNORMAL LOW (ref 22–32)
Calcium: 9.4 mg/dL (ref 8.9–10.3)
Chloride: 107 mmol/L (ref 98–111)
Creatinine, Ser: 2.5 mg/dL — ABNORMAL HIGH (ref 0.61–1.24)
GFR, Estimated: 25 mL/min — ABNORMAL LOW (ref >60)
Glucose, Bld: 142 mg/dL — ABNORMAL HIGH (ref 70–99)
Potassium: 3.4 mmol/L — ABNORMAL LOW (ref 3.5–5.1)
Sodium: 137 mmol/L (ref 135–145)

## 2023-02-09 LAB — URINALYSIS, ROUTINE W REFLEX MICROSCOPIC
Bilirubin Urine: NEGATIVE
Glucose, UA: NEGATIVE mg/dL
Hgb urine dipstick: NEGATIVE
Ketones, ur: NEGATIVE mg/dL
Nitrite: NEGATIVE
Protein, ur: 100 mg/dL — AB
Specific Gravity, Urine: 1.01 (ref 1.005–1.030)
WBC, UA: >50 WBC/hpf (ref 0–5)
pH: 5 (ref 5.0–8.0)

## 2023-02-09 LAB — CBC
HCT: 43.9 % (ref 39.0–52.0)
Hemoglobin: 14.2 g/dL (ref 13.0–17.0)
MCH: 29.7 pg (ref 26.0–34.0)
MCHC: 32.3 g/dL (ref 30.0–36.0)
MCV: 91.8 fL (ref 80.0–100.0)
Platelets: 145 10*3/uL — ABNORMAL LOW (ref 150–400)
RBC: 4.78 MIL/uL (ref 4.22–5.81)
RDW: 15.2 % (ref 11.5–15.5)
WBC: 6.3 10*3/uL (ref 4.0–10.5)
nRBC: 0 % (ref 0.0–0.2)

## 2023-02-09 MED ORDER — CEPHALEXIN 500 MG PO CAPS: 500.0000 mg | ORAL_CAPSULE | Freq: Two times a day (BID) | ORAL | 0 refills | Status: DC

## 2023-02-09 MED ORDER — CEPHALEXIN 500 MG PO CAPS
500.0000 mg | ORAL_CAPSULE | Freq: Once | ORAL | Status: AC
  Administered 2023-02-10: 500 mg via ORAL
  Filled 2023-02-09: qty 1

## 2023-02-09 NOTE — ED Triage Notes (Signed)
Pt to ED [facility] from home for fall, states walking back from bathroom and both knees gave out. C/o right elbow pain. Denies hitting head.

## 2023-02-09 NOTE — Discharge Instructions (Addendum)
Your x-rays and CT scan of the right hip were all okay today.  Blood tests were okay.  Your urine test does show a bladder infection.  Take Keflex as prescribed and follow-up with your doctor.

## 2023-02-09 NOTE — ED Provider Triage Note (Signed)
Emergency Medicine Provider Triage Evaluation Note  George Bolton , a 82 y.o. male  was evaluated in triage.  Pt complains of weakness and unwitnessed fall. While coming out of the bathroom, he fell onto his knees. He states he just felt weak and couldn't stand. .  Physical Exam  BP (!) 141/83   Pulse 84   Resp 18   Ht 6' (1.829 m)   Wt 79.4 kg   SpO2 99%   BMI 23.73 kg/m  Gen:   Awake, no distress   Resp:  Normal effort  MSK:   Moves extremities without difficulty  Other:    Medical Decision Making  Medically screening exam initiated at 3:51 PM.  Appropriate orders placed.  George Bolton was informed that the remainder of the evaluation will be completed by another provider, this initial triage assessment does not replace that evaluation, and the importance of remaining in the ED until their evaluation is complete.  Protocols started.   Chinita Pester, FNP 02/09/23 [phone]

## 2023-02-09 NOTE — ED Provider Notes (Signed)
[facility] Provider Note    Event Date/Time   First MD Initiated Contact with Patient 02/09/23 1951     (approximate)   History   Chief Complaint: Fall   HPI  George Bolton is a 82 y.o. male with a history of hypertension, atrial fibrillation, diabetes, CKD who comes ED due to a fall.  He was coming out of the bathroom at home, and reports that his knees buckled causing him to fall down.  Complains of pain in the right elbow.  Also has pain in the right hip that is worse with standing since then.  No head injury or loss of consciousness.  Denies any preceding symptoms such as chest pain shortness of breath or abdominal pain.  Reports being in his usual state of health recently.  Patient does report increased urinary frequency recently.     Physical Exam   Triage Vital Signs: ED Triage Vitals  Enc Vitals Group     BP 02/09/23 1546 (!) 141/83     Pulse Rate 02/09/23 1546 84     Resp 02/09/23 1546 18     Temp 02/09/23 1552 97.6 F (36.4 C)     Temp src --      SpO2 02/09/23 1546 99 %     Weight 02/09/23 1546 175 lb (79.4 kg)     Height 02/09/23 1546 6' (1.829 m)     Head Circumference --      Peak Flow --      Pain Score 02/09/23 1546 6     Pain Loc --      Pain Edu? --      Excl. in GC? --     Most recent vital signs: Vitals:   02/09/23 1546 02/09/23 1552  BP: (!) 141/83   Pulse: 84   Resp: 18   Temp:  97.6 F (36.4 C)  SpO2: 99%     General: Awake, no distress.  CV:  Good peripheral perfusion.  Irregular rhythm, rate controlled heart rate 90 Resp:  Normal effort.  Clear to auscultation bilaterally Abd:  No distention.  Soft nontender Other:  Mild tenderness at right hip.  No limb shortening or external rotation.  There is chronic suprapatellar effusion of the right knee.  Otherwise normal range of motion in all extremities without bony tenderness.   ED Results / Procedures / Treatments   Labs (all labs ordered are listed, but  only abnormal results are displayed) Labs Reviewed  BASIC METABOLIC PANEL - Abnormal; Notable for the following components:      Result Value   Potassium 3.4 (*)    CO2 20 (*)    Glucose, Bld 142 (*)    BUN 37 (*)    Creatinine, Ser 2.50 (*)    GFR, Estimated 25 (*)    All other components within normal limits  CBC - Abnormal; Notable for the following components:   Platelets 145 (*)    All other components within normal limits  URINALYSIS, ROUTINE W REFLEX MICROSCOPIC - Abnormal; Notable for the following components:   Color, Urine YELLOW (*)    APPearance HAZY (*)    Protein, ur 100 (*)    Leukocytes,Ua LARGE (*)    Bacteria, UA RARE (*)    All other components within normal limits  URINE CULTURE     EKG Interpreted by me Atrial fibrillation, rate of 90.  Normal axis, poor R wave progression.  Normal ST segments and T waves.  RADIOLOGY X-ray right hip interpreted by me, negative for fracture.  Radiology report reviewed, suggesting subtle buckle fracture  CT right hip negative  Chest x-ray unremarkable   PROCEDURES:  Procedures   MEDICATIONS ORDERED IN ED: Medications  cephALEXin (KEFLEX) capsule 500 mg (has no administration in time range)     IMPRESSION / MDM / ASSESSMENT AND PLAN / ED COURSE  I reviewed the triage vital signs and the nursing notes.  DDx: Right hip fracture, anemia, electrolyte abnormality, dehydration, UTI, pleural effusion  Patient's presentation is most consistent with acute presentation with potential threat to life or bodily function.  Patient presents with fall at home.  He is having urinary frequency.  Serum labs unremarkable.  Trauma workup with chest x-ray and right hip imaging unremarkable.  Urinalysis is consistent with cystitis.  Will start the patient on Keflex, send urine culture.  Will obtain bladder scan to determine if urinary catheter is needed.  Stable for discharge.  Family member at bedside is comfortable with  outpatient management as well.       FINAL CLINICAL IMPRESSION(S) / ED DIAGNOSES   Final diagnoses:  Cystitis  Contusion of right hip, initial encounter  Fall in home, initial encounter     Rx / DC Orders   ED Discharge Orders          Ordered    cephALEXin (KEFLEX) 500 MG capsule  2 times daily        02/09/23 2253             Note:  This document was prepared using Dragon voice recognition software and may include unintentional dictation errors.   Sharman Cheek, MD 02/09/23 [phone]

## 2023-02-09 NOTE — ED Triage Notes (Addendum)
First nurse note: Pt to ED via [facility] from home. Pt had a fall after his knees gave out. Pt fell onto his buttocks and abrasions to right elbow. Pt denies LOC. Pt is on Eliquis.   EMS VS:  CBG 138 144/92 HR 98 94% RA

## 2023-02-10 NOTE — ED Notes (Addendum)
Bladder scan performed after void revealing 76ml and then >700 and then >900. Due to uncertantity and multiple contridicting readings, I&O completed and 60ml obtained. MD notified.

## 2023-02-10 NOTE — ED Notes (Signed)
called to [facility] for pt transport home/rep:George Bolton Kitchen

## 2023-02-13 LAB — URINE CULTURE: Culture: >=100000 — AB

## 2023-02-14 NOTE — Progress Notes (Cosign Needed)
ED Antimicrobial Stewardship Positive Culture Follow Up   George Bolton is an 82 y.o. male who presented to [facility] on 02/09/2023 with a chief complaint of  Chief Complaint  Patient presents with   Fall    Recent Results (from the past 720 hour(s))  Urine Culture     Status: Abnormal   Collection Time: 02/09/23  5:33 PM   Specimen: Urine, Random  Result Value Ref Range Status   Specimen Description   Final    URINE, RANDOM Performed at [facility], [address] [place], [place] [address]    Special Requests   Final    NONE Performed at [facility], [address] [place], [place] [address]    Culture (A)  Final    >=100,000 COLONIES/mL ENTEROCOCCUS FAECALIS 20,000 COLONIES/mL PSEUDOMONAS AERUGINOSA    Report Status 02/13/2023 FINAL  Final   Organism ID, Bacteria ENTEROCOCCUS FAECALIS (A)  Final   Organism ID, Bacteria PSEUDOMONAS AERUGINOSA (A)  Final      Susceptibility   Enterococcus faecalis - MIC*    AMPICILLIN <=2 SENSITIVE Sensitive     NITROFURANTOIN <=16 SENSITIVE Sensitive     VANCOMYCIN 1 SENSITIVE Sensitive     * >=100,000 COLONIES/mL ENTEROCOCCUS FAECALIS   Pseudomonas aeruginosa - MIC*    CEFTAZIDIME 2 SENSITIVE Sensitive     CIPROFLOXACIN <=0.25 SENSITIVE Sensitive     GENTAMICIN 2 SENSITIVE Sensitive     IMIPENEM 2 SENSITIVE Sensitive     PIP/TAZO 8 SENSITIVE Sensitive     CEFEPIME 1 SENSITIVE Sensitive     * 20,000 COLONIES/mL PSEUDOMONAS AERUGINOSA     Treated with cephalexin, organisms not covered by  prescribed antimicrobial cephalexin. Messaged with MD about whether to treat or not and MD would like to treat at this time (MD is the same MD who saw patient at ED visit)  New antibiotic prescriptions:  -Ciprofloxacin 500 mg po Q24h x 5 days    (note that Crcl was 24.6 ml/min at ED visit, so adjusted to once daily dosing) -Amoxicillin  po q12h x 5 days    ED Provider: Sharman Cheek  02/14/23  Called pt home #  [phone] and spoke w/ pt's wife [name].  Pt uses [facility] and [facility] are [facility]. Called [facility] at [phone] and was unable to leave a message for prescriptions. Will attempt again in am 4/22 when pharmacy is open  02/15/23: called [facility] and was transferred to provider Dr. Garey Ham. Discussed Urine culture and recommendations. Dr. Garey Ham requested the culture results be faxed over [phone]). She will assess.   [name] A 02/14/2023, 3:16 PM Clinical Pharmacist

## 2023-04-25 ENCOUNTER — Emergency Department: Payer: Medicare (Managed Care)

## 2023-04-25 ENCOUNTER — Inpatient Hospital Stay
Admission: EM | Admit: 2023-04-25 | Discharge: 2023-04-27 | DRG: 291 | Disposition: A | Discharge status: Skilled Nursing Facility | Payer: Medicare (Managed Care) | Source: Skilled Nursing Facility | Attending: Internal Medicine | Admitting: Internal Medicine

## 2023-04-25 ENCOUNTER — Other Ambulatory Visit: Payer: Self-pay

## 2023-04-25 DIAGNOSIS — E785 Hyperlipidemia, unspecified: Secondary | ICD-10-CM | POA: Diagnosis present

## 2023-04-25 DIAGNOSIS — R350 Frequency of micturition: Secondary | ICD-10-CM | POA: Diagnosis present

## 2023-04-25 DIAGNOSIS — Z7901 Long term (current) use of anticoagulants: Secondary | ICD-10-CM

## 2023-04-25 DIAGNOSIS — N401 Enlarged prostate with lower urinary tract symptoms: Secondary | ICD-10-CM | POA: Diagnosis present

## 2023-04-25 DIAGNOSIS — Z79899 Other long term (current) drug therapy: Secondary | ICD-10-CM

## 2023-04-25 DIAGNOSIS — N184 Chronic kidney disease, stage 4 (severe): Secondary | ICD-10-CM | POA: Diagnosis present

## 2023-04-25 DIAGNOSIS — I482 Chronic atrial fibrillation, unspecified: Secondary | ICD-10-CM | POA: Diagnosis present

## 2023-04-25 DIAGNOSIS — Z66 Do not resuscitate: Secondary | ICD-10-CM | POA: Diagnosis present

## 2023-04-25 DIAGNOSIS — E1121 Type 2 diabetes mellitus with diabetic nephropathy: Secondary | ICD-10-CM | POA: Diagnosis present

## 2023-04-25 DIAGNOSIS — N400 Enlarged prostate without lower urinary tract symptoms: Secondary | ICD-10-CM | POA: Insufficient documentation

## 2023-04-25 DIAGNOSIS — I509 Heart failure, unspecified: Principal | ICD-10-CM

## 2023-04-25 DIAGNOSIS — R0902 Hypoxemia: Secondary | ICD-10-CM | POA: Diagnosis present

## 2023-04-25 DIAGNOSIS — I10 Essential (primary) hypertension: Secondary | ICD-10-CM

## 2023-04-25 DIAGNOSIS — I5023 Acute on chronic systolic (congestive) heart failure: Secondary | ICD-10-CM | POA: Diagnosis present

## 2023-04-25 DIAGNOSIS — Z9049 Acquired absence of other specified parts of digestive tract: Secondary | ICD-10-CM

## 2023-04-25 DIAGNOSIS — Z888 Allergy status to other drugs, medicaments and biological substances status: Secondary | ICD-10-CM

## 2023-04-25 DIAGNOSIS — Z8673 Personal history of transient ischemic attack (TIA), and cerebral infarction without residual deficits: Secondary | ICD-10-CM

## 2023-04-25 DIAGNOSIS — R0602 Shortness of breath: Secondary | ICD-10-CM | POA: Diagnosis not present

## 2023-04-25 DIAGNOSIS — E1122 Type 2 diabetes mellitus with diabetic chronic kidney disease: Secondary | ICD-10-CM | POA: Diagnosis present

## 2023-04-25 DIAGNOSIS — I5033 Acute on chronic diastolic (congestive) heart failure: Secondary | ICD-10-CM | POA: Diagnosis present

## 2023-04-25 DIAGNOSIS — F32A Depression, unspecified: Secondary | ICD-10-CM

## 2023-04-25 DIAGNOSIS — I130 Hypertensive heart and chronic kidney disease with heart failure and stage 1 through stage 4 chronic kidney disease, or unspecified chronic kidney disease: Principal | ICD-10-CM | POA: Diagnosis present

## 2023-04-25 DIAGNOSIS — Z823 Family history of stroke: Secondary | ICD-10-CM

## 2023-04-25 DIAGNOSIS — M109 Gout, unspecified: Secondary | ICD-10-CM | POA: Diagnosis present

## 2023-04-25 DIAGNOSIS — E1169 Type 2 diabetes mellitus with other specified complication: Secondary | ICD-10-CM | POA: Diagnosis present

## 2023-04-25 DIAGNOSIS — I2510 Atherosclerotic heart disease of native coronary artery without angina pectoris: Secondary | ICD-10-CM

## 2023-04-25 LAB — TROPONIN I (HIGH SENSITIVITY): Troponin I (High Sensitivity): 29 ng/L — ABNORMAL HIGH (ref <18)

## 2023-04-25 LAB — URINALYSIS, ROUTINE W REFLEX MICROSCOPIC
Bilirubin Urine: NEGATIVE
Glucose, UA: NEGATIVE mg/dL
Ketones, ur: NEGATIVE mg/dL
Nitrite: NEGATIVE
Protein, ur: 100 mg/dL — AB
Specific Gravity, Urine: 1.011 (ref 1.005–1.030)
pH: 6 (ref 5.0–8.0)

## 2023-04-25 LAB — COMPREHENSIVE METABOLIC PANEL
ALT: 27 U/L (ref 0–44)
AST: 34 U/L (ref 15–41)
Albumin: 3.8 g/dL (ref 3.5–5.0)
Alkaline Phosphatase: 94 U/L (ref 38–126)
Anion gap: 8 (ref 5–15)
BUN: 39 mg/dL — ABNORMAL HIGH (ref 8–23)
CO2: 20 mmol/L — ABNORMAL LOW (ref 22–32)
Calcium: 9.4 mg/dL (ref 8.9–10.3)
Chloride: 109 mmol/L (ref 98–111)
Creatinine, Ser: 1.96 mg/dL — ABNORMAL HIGH (ref 0.61–1.24)
GFR, Estimated: 33 mL/min — ABNORMAL LOW (ref >60)
Glucose, Bld: 125 mg/dL — ABNORMAL HIGH (ref 70–99)
Potassium: 4.5 mmol/L (ref 3.5–5.1)
Sodium: 137 mmol/L (ref 135–145)
Total Bilirubin: 1.2 mg/dL (ref 0.3–1.2)
Total Protein: 6.7 g/dL (ref 6.5–8.1)

## 2023-04-25 LAB — CBC
HCT: 41 % (ref 39.0–52.0)
Hemoglobin: 12.9 g/dL — ABNORMAL LOW (ref 13.0–17.0)
MCH: 30.5 pg (ref 26.0–34.0)
MCHC: 31.5 g/dL (ref 30.0–36.0)
MCV: 96.9 fL (ref 80.0–100.0)
Platelets: 139 10*3/uL — ABNORMAL LOW (ref 150–400)
RBC: 4.23 MIL/uL (ref 4.22–5.81)
RDW: 15.9 % — ABNORMAL HIGH (ref 11.5–15.5)
WBC: 8.4 10*3/uL (ref 4.0–10.5)
nRBC: 0 % (ref 0.0–0.2)

## 2023-04-25 LAB — BRAIN NATRIURETIC PEPTIDE: B Natriuretic Peptide: 924.7 pg/mL — ABNORMAL HIGH (ref 0.0–100.0)

## 2023-04-25 MED ORDER — APIXABAN 2.5 MG PO TABS
2.5000 mg | ORAL_TABLET | Freq: Two times a day (BID) | ORAL | Status: DC
  Administered 2023-04-26 – 2023-04-27 (×4): 2.5 mg via ORAL
  Filled 2023-04-25 (×5): qty 1

## 2023-04-25 MED ORDER — ACETAMINOPHEN 325 MG PO TABS: 650.0000 mg | ORAL_TABLET | Freq: Four times a day (QID) | ORAL | Status: DC | PRN

## 2023-04-25 MED ORDER — FUROSEMIDE 10 MG/ML IJ SOLN
40.0000 mg | Freq: Once | INTRAMUSCULAR | Status: AC
  Administered 2023-04-25: 40 mg via INTRAVENOUS
  Filled 2023-04-25: qty 4

## 2023-04-25 MED ORDER — TRAMADOL HCL 50 MG PO TABS: 50.0000 mg | ORAL_TABLET | Freq: Four times a day (QID) | ORAL | Status: DC | PRN

## 2023-04-25 MED ORDER — ACETAMINOPHEN 650 MG RE SUPP: 650.0000 mg | Freq: Four times a day (QID) | RECTAL | Status: DC | PRN

## 2023-04-25 MED ORDER — METOPROLOL SUCCINATE ER 100 MG PO TB24
100.0000 mg | ORAL_TABLET | Freq: Every evening | ORAL | Status: DC
  Administered 2023-04-26: 100 mg via ORAL
  Filled 2023-04-25: qty 1

## 2023-04-25 MED ORDER — ISOSORBIDE MONONITRATE ER 30 MG PO TB24
30.0000 mg | ORAL_TABLET | Freq: Two times a day (BID) | ORAL | Status: DC
  Administered 2023-04-26 – 2023-04-27 (×4): 30 mg via ORAL
  Filled 2023-04-25 (×4): qty 1

## 2023-04-25 MED ORDER — HYDRALAZINE HCL 50 MG PO TABS
100.0000 mg | ORAL_TABLET | Freq: Three times a day (TID) | ORAL | Status: DC
  Administered 2023-04-26 – 2023-04-27 (×5): 100 mg via ORAL
  Filled 2023-04-25 (×5): qty 2

## 2023-04-25 MED ORDER — TRAZODONE HCL 50 MG PO TABS: 25.0000 mg | ORAL_TABLET | Freq: Every evening | ORAL | Status: DC | PRN

## 2023-04-25 MED ORDER — FINASTERIDE 5 MG PO TABS
5.0000 mg | ORAL_TABLET | Freq: Every day | ORAL | Status: DC
  Administered 2023-04-26 – 2023-04-27 (×2): 5 mg via ORAL
  Filled 2023-04-25 (×2): qty 1

## 2023-04-25 MED ORDER — ONDANSETRON HCL 4 MG/2ML IJ SOLN: 4.0000 mg | Freq: Four times a day (QID) | INTRAMUSCULAR | Status: DC | PRN

## 2023-04-25 MED ORDER — ONDANSETRON HCL 4 MG PO TABS: 4.0000 mg | ORAL_TABLET | Freq: Four times a day (QID) | ORAL | Status: DC | PRN

## 2023-04-25 MED ORDER — FUROSEMIDE 10 MG/ML IJ SOLN
40.0000 mg | Freq: Two times a day (BID) | INTRAMUSCULAR | Status: DC
  Administered 2023-04-26 – 2023-04-27 (×3): 40 mg via INTRAVENOUS
  Filled 2023-04-25 (×3): qty 4

## 2023-04-25 MED ORDER — CLONIDINE HCL 0.1 MG/24HR TD PTWK
0.1000 mg | MEDICATED_PATCH | TRANSDERMAL | Status: DC
  Administered 2023-04-26: 0.1 mg via TRANSDERMAL
  Filled 2023-04-25: qty 1

## 2023-04-25 MED ORDER — ROSUVASTATIN CALCIUM 10 MG PO TABS
20.0000 mg | ORAL_TABLET | Freq: Every day | ORAL | Status: DC
  Administered 2023-04-26 – 2023-04-27 (×2): 20 mg via ORAL
  Filled 2023-04-25 (×2): qty 2

## 2023-04-25 MED ORDER — MAGNESIUM HYDROXIDE 400 MG/5ML PO SUSP: 30.0000 mL | Freq: Every day | ORAL | Status: DC | PRN

## 2023-04-25 MED ORDER — ALLOPURINOL 100 MG PO TABS
100.0000 mg | ORAL_TABLET | Freq: Every day | ORAL | Status: DC
  Administered 2023-04-26 – 2023-04-27 (×2): 100 mg via ORAL
  Filled 2023-04-25 (×2): qty 1

## 2023-04-25 NOTE — H&P (Signed)
[place]   PATIENT NAME: Robertson Modglin    MR#:  [id number]  DATE OF BIRTH:  05-06-1941  DATE OF ADMISSION:  04/25/2023  PRIMARY CARE PHYSICIAN: Unice Bailey, MD   Patient is coming from: Home  REQUESTING/REFERRING PHYSICIAN: Jene Every, MD  CHIEF COMPLAINT:   Chief Complaint  Patient presents with   Shortness of Breath    Pt is coming from [facility] for evaluation of SOB x 1 day. Pt reports dry cough but denies any pain. Pt has hx of CHF. EMS obtained sats of 92% RA and 96% 2l/min [place].     HISTORY OF PRESENT ILLNESS:  TUPAC SUNDE is a 82 y.o. male with medical history significant for diastolic CHF, chronic atrial fibrillation on Eliquis, gout, stage IV CKD, hypertension and history of embolic stroke, who presented to the emergency room with a [place] of worsening dyspnea over the last couple of days with associated orthopnea and worsening lower extremity edema.  He admitted to dry cough and occasional wheezing with mild chest tightness with deep breathing.  No fever or chills.  No nausea or vomiting or abdominal pain.  He has been having mild urinary frequency and urgency.  No dysuria, oliguria or hematuria or flank pain.  ED Course: When he came to the ER, BP was 177/116 with a respiratory rate of 29 and otherwise normal vital signs.  He was placed on 2 L O2 by nasal cannula and pulse currently improved from 92 to 97%.  Labs revealed a CO2 of 20 and a BUN of 39 with creatinine 1.96, better than previous levels with stage IV CKD, and otherwise normal CMP.  CBC showed hemoglobin 12.9 hematocrit 41 with platelets of 139.  BNP was 924.7 and high sensitive troponin I was 29.  UA showed large leukocytes and 100 protein.  EKG as reviewed by me : EKG showed atrial fibrillation with controlled ventricular response of 85 with Q waves inferiorly. Imaging: 2 view chest x-ray showed no acute cardiopulmonary disease.  The patient was given 40 mg of IV Lasix.  He  will be admitted to a medical telemetry observation bed for further evaluation and management. PAST MEDICAL HISTORY:   Past Medical History:  Diagnosis Date   Acute embolic stroke (HCC) 11/26/2021   Chronic atrial fibrillation (HCC)    a. Afib dates back to at least 2002 when reviewing prior EKG with EKGs from 2014 onward showing Afib; b. CHADS2VASc at least 4 (CHF, HTN, age x 2)   Chronic diastolic CHF (congestive heart failure) (HCC)    a. TTE 5/19: EF 70-75%, mod LVH, near complete obliteration of the mid and apical LV cavity (can be seen in apical hypertrophic CM), mildly dilated LA, RV cavity size nl, RV wall thickness nl, RVSF mildly reduced, mildly dilated RA; b. 02/2018 Limited echo w/ definity: EF 50-65%, no apical hypertrophic CM.   Community acquired bilateral lower lobe pneumonia 11/23/2021   Goals of care, counseling/discussion 12/11/2021   Gout    Hypertension    Hypertensive emergency 12/10/2021   Hypertensive urgency 07/16/2022    PAST SURGICAL HISTORY:   Past Surgical History:  Procedure Laterality Date   CHOLECYSTECTOMY N/A 01/13/2017   Procedure: LAPAROSCOPIC CHOLECYSTECTOMY;  Surgeon: Leafy Ro, MD;  Location: [facility] ORS;  Service: General;  Laterality: N/A;    SOCIAL HISTORY:   Social History   Tobacco Use   Smoking status: Never   Smokeless tobacco: Never  Substance Use  Topics   Alcohol use: No    FAMILY HISTORY:   Family History  Problem Relation Age of Onset   Stroke Mother     DRUG ALLERGIES:   Allergies  Allergen Reactions   Cortisone Anaphylaxis   Triamcinolone     Other reaction(s): Unknown    REVIEW OF SYSTEMS:   ROS As per history of present illness. All pertinent systems were reviewed above. Constitutional, HEENT, cardiovascular, respiratory, GI, GU, musculoskeletal, neuro, psychiatric, endocrine, integumentary and hematologic systems were reviewed and are otherwise negative/unremarkable except for positive findings mentioned  above in the HPI.   MEDICATIONS AT HOME:   Prior to Admission medications   Medication Sig Start Date End Date Taking? Authorizing Provider  acetaminophen (TYLENOL) 325 MG tablet Take 2 tablets (650 mg total) by mouth every 6 (six) hours as needed for mild pain (or Fever >/= 101). 11/28/21   Alford Highland, MD  allopurinol (ZYLOPRIM) 100 MG tablet Take 1 tablet (100 mg total) by mouth daily. Home med. 09/20/22   Darlin Priestly, MD  apixaban (ELIQUIS) 2.5 MG TABS tablet Take 2.5 mg by mouth 2 (two) times daily. 12/27/19   [provider]  cephALEXin (KEFLEX) 500 MG capsule Take 1 capsule (500 mg total) by mouth 2 (two) times daily. 02/09/23   Sharman Cheek, MD  cloNIDine (CATAPRES - DOSED IN MG/24 HR) 0.1 mg/24hr patch Place 0.1 mg onto the skin once a week.    [provider]  famotidine (PEPCID) 20 MG tablet Take 20 mg by mouth 2 (two) times daily as needed. 12/16/21   [provider]  finasteride (PROSCAR) 5 MG tablet Take 1 tablet (5 mg total) by mouth daily. 05/29/22   Vaillancourt, Lelon Mast, PA-C  furosemide (LASIX) 40 MG tablet Take 40 mg by mouth daily.    [provider]  hydrALAZINE (APRESOLINE) 100 MG tablet Take 1 tablet (100 mg total) by mouth 3 (three) times daily. 04/02/22   Delma Freeze, FNP  isosorbide mononitrate (IMDUR) 30 MG 24 hr tablet Take 30 mg by mouth 2 (two) times daily. 01/31/21   [provider]  metoprolol succinate (TOPROL-XL) 100 MG 24 hr tablet Take 1 tablet (100 mg total) by mouth every evening. Take with or immediately following a meal.  Home med. 09/20/22   Darlin Priestly, MD  Multiple Vitamin (MULTIVITAMIN WITH MINERALS) TABS tablet Take 1 tablet by mouth daily. 11/29/21   Alford Highland, MD  Nutritional Supplements (,FEEDING SUPPLEMENT, PROSOURCE PLUS) liquid Take 30 mLs by mouth 3 (three) times daily between meals. 11/28/21   Alford Highland, MD  nystatin cream (MYCOSTATIN) Apply 1 Application topically 4 (four) times daily.  05/22/22   [provider]  rosuvastatin (CRESTOR) 20 MG tablet Take 1 tablet (20 mg total) by mouth daily. 11/28/21   Alford Highland, MD      VITAL SIGNS:  Blood pressure (!) 167/91, pulse 88, temperature 98.3 F (36.8 C), temperature source Oral, resp. rate (!) 25, height 6\' 1"  (1.854 m), weight 82.3 kg, SpO2 96 %.  PHYSICAL EXAMINATION:  Physical Exam  GENERAL:  82 y.o.-year-old patient lying in the bed with mild respiratory distress with conversational dyspnea.  EYES: Pupils equal, round, reactive to light and accommodation. No scleral icterus. Extraocular muscles intact.  HEENT: Head atraumatic, normocephalic. Oropharynx and nasopharynx clear.  NECK:  Supple, no jugular venous distention. No thyroid enlargement, no tenderness.  LUNGS: Diminished bibasal breath sounds with no wheezing, rales,rhonchi or crepitation. No use of accessory muscles of respiration.  CARDIOVASCULAR: Regular rate and rhythm, S1, S2 normal. No murmurs, rubs, or gallops.  ABDOMEN: Soft, nondistended, nontender. Bowel sounds present. No organomegaly or mass.  EXTREMITIES: 1-2+ bilateral lower extremity pitting edema with no cyanosis, or clubbing.  NEUROLOGIC: Cranial nerves II through XII are intact. Muscle strength 5/5 in all extremities. Sensation intact. Gait not checked.  PSYCHIATRIC: The patient is alert and oriented x 3.  Normal affect and good eye contact. SKIN: No obvious rash, lesion, or ulcer.   LABORATORY PANEL:   CBC Recent Labs  Lab 04/25/23 2024  WBC 8.4  HGB 12.9*  HCT 41.0  PLT 139*   ------------------------------------------------------------------------------------------------------------------  Chemistries  Recent Labs  Lab 04/25/23 2024  NA 137  K 4.5  CL 109  CO2 20*  GLUCOSE 125*  BUN 39*  CREATININE 1.96*  CALCIUM 9.4  AST 34  ALT 27  ALKPHOS 94  BILITOT 1.2    ------------------------------------------------------------------------------------------------------------------  Cardiac Enzymes No results for input(s): "TROPONINI" in the last 168 hours. ------------------------------------------------------------------------------------------------------------------  RADIOLOGY:  DG Chest 2 View  Result Date: 04/25/2023 CLINICAL DATA:  Shortness of breath for 1 day EXAM: CHEST - 2 VIEW COMPARISON:  02/09/2023 FINDINGS: Cardiac shadow is enlarged but stable. Aortic calcifications are noted. The lungs are well aerated bilaterally. No focal infiltrate or effusion is seen. No bony abnormality is noted. IMPRESSION: No active cardiopulmonary disease. Electronically Signed   By: Alcide Clever M.D.   On: 04/25/2023 20:26      IMPRESSION AND PLAN:  Assessment and Plan: Acute on chronic diastolic CHF (congestive heart failure) (HCC)  -The patient will be admitted to a cardiac telemetry bed. - We will continue diuresis with IV Lasix. - We Will follow serial troponins. - We will follow I's and O's and daily weights.    Atrial fibrillation, chronic (HCC) - We will continue Toprol-XL and Eliquis.  His rate is currently controlled.  Type 2 diabetes mellitus with diabetic nephropathy (HCC) - The patient will be placed on supplemental coverage with NovoLog.  Essential hypertension - We will continue his antihypertensive therapy.  Dyslipidemia - We will continue statin therapy.  Coronary artery disease - We will continue Toprol-XL and Imdur. - He will be placed on as needed sublingual nitroglycerin and morphine sulfate for chest pain.  Depression - We will continue Lexapro.  BPH (benign prostatic hyperplasia) - We will continue Proscar.       DVT prophylaxis: Eliquis. Advanced Care Planning:  Code Status: The patient is an DRN and DNI.  He has an out of facility DNR form. Family Communication:  The plan of care was discussed in details with  the patient (and family). I answered all questions. The patient agreed to proceed with the above mentioned plan. Further management will depend upon hospital course. Disposition Plan: Back to previous home environment Consults called: none.  All the records are reviewed and case discussed with ED provider.  Status is: Observation  I certify that at the time of admission, it is my clinical judgment that the patient will require  hospital care extending less than 2 midnights.                            Dispo: The patient is from: Home              Anticipated d/c is to: Home              Patient currently is not medically stable to  d/c.              Difficult to place patient: No  Hannah Beat M.D on 04/26/2023 at 12:21 AM  [facility]   From 7 PM-7 AM, contact night-coverage [email or web address]  CC: Primary care physician; Unice Bailey, MD

## 2023-04-25 NOTE — ED Triage Notes (Signed)
Pt is coming from [facility] for evaluation of SOB x 1 day. Pt reports dry cough but denies any pain. Pt has hx of CHF. EMS obtained sats of 92% RA and 96% 2l/min [place].

## 2023-04-25 NOTE — ED Provider Notes (Signed)
[facility] Provider Note    Event Date/Time   First MD Initiated Contact with Patient 04/25/23 1959     (approximate)   History   Shortness of breath  HPI  George Bolton is a 82 y.o. male with a history of atrial fibrillation, CHF, hypertension who presents with complaints of shortness of breath.  Patient denies chest pain.  No fevers reported.  No calf pain noted      Physical Exam   Triage Vital Signs: ED Triage Vitals  Enc Vitals Group     BP 04/25/23 2005 (!) 174/112     Pulse Rate 04/25/23 2005 84     Resp 04/25/23 2005 (!) 26     Temp --      Temp src --      SpO2 04/25/23 2005 92 %     Weight 04/25/23 2007 82.3 kg (181 lb 6.4 oz)     Height 04/25/23 2007 1.829 m (6')     Head Circumference --      Peak Flow --      Pain Score 04/25/23 2006 0     Pain Loc --      Pain Edu? --      Excl. in GC? --     Most recent vital signs: Vitals:   04/25/23 2130 04/25/23 2238  BP: (!) 148/90   Pulse: 83   Resp: 16   Temp:  98.3 F (36.8 C)  SpO2: 95%      General: Awake, no distress.  CV:  Good peripheral perfusion.  Resp:  Mild tachypnea, bibasilar Rales, 1+ edema bilaterally Abd:  No distention.  Other:     ED Results / Procedures / Treatments   Labs (all labs ordered are listed, but only abnormal results are displayed) Labs Reviewed  CBC - Abnormal; Notable for the following components:      Result Value   Hemoglobin 12.9 (*)    RDW 15.9 (*)    Platelets 139 (*)    All other components within normal limits  COMPREHENSIVE METABOLIC PANEL - Abnormal; Notable for the following components:   CO2 20 (*)    Glucose, Bld 125 (*)    BUN 39 (*)    Creatinine, Ser 1.96 (*)    GFR, Estimated 33 (*)    All other components within normal limits  BRAIN NATRIURETIC PEPTIDE - Abnormal; Notable for the following components:   B Natriuretic Peptide 924.7 (*)    All other components within normal limits  URINALYSIS, ROUTINE W REFLEX  MICROSCOPIC - Abnormal; Notable for the following components:   Color, Urine YELLOW (*)    APPearance CLEAR (*)    Hgb urine dipstick SMALL (*)    Protein, ur 100 (*)    Leukocytes,Ua LARGE (*)    All other components within normal limits  TROPONIN I (HIGH SENSITIVITY) - Abnormal; Notable for the following components:   Troponin I (High Sensitivity) 29 (*)    All other components within normal limits  URINE CULTURE     EKG  ED ECG REPORT I, Jene Every, the attending physician, personally viewed and interpreted this ECG.  Date: 04/25/2023  Rhythm: Atrial fibrillation QRS Axis: normal Intervals: normal ST/T Wave abnormalities: normal Narrative Interpretation: no evidence of acute ischemia    RADIOLOGY Chest x-ray viewed interpret by me, no acute normality    PROCEDURES:  Critical Care performed:   Procedures   MEDICATIONS ORDERED IN ED: Medications  furosemide (LASIX)  injection 40 mg (40 mg Intravenous Given 04/25/23 2238)     IMPRESSION / MDM / ASSESSMENT AND PLAN / ED COURSE  I reviewed the triage vital signs and the nursing notes. Patient's presentation is most consistent with acute presentation with potential threat to life or bodily function.  Patient presents with shortness of breath as described above.  He reports it is worse when he lays flat.  Suspicious for CHF exacerbation versus possible pneumonia  He is on blood thinners, not consistent with PE  Lab work notable for elevated BNP, patient is requiring 2 L supplemental oxygen to keep saturations above 90% however chest x-ray is overall reassuring.  Suspect early CHF exacerbation, will treat with IV Lasix  Urinalysis with positive leukocytes, denies complaints of dysuria to me.  No fevers will send for culture no antibiotics at this time  Consulted the hospitalist for admission for shortness of breath, diuresis          FINAL CLINICAL IMPRESSION(S) / ED DIAGNOSES   Final diagnoses:   Acute on chronic congestive heart failure, unspecified heart failure type (HCC)  Hypoxia     Rx / DC Orders   ED Discharge Orders     None        Note:  This document was prepared using Dragon voice recognition software and may include unintentional dictation errors.   Jene Every, MD 04/25/23 2241

## 2023-04-26 DIAGNOSIS — N184 Chronic kidney disease, stage 4 (severe): Secondary | ICD-10-CM | POA: Diagnosis present

## 2023-04-26 DIAGNOSIS — I130 Hypertensive heart and chronic kidney disease with heart failure and stage 1 through stage 4 chronic kidney disease, or unspecified chronic kidney disease: Secondary | ICD-10-CM | POA: Diagnosis present

## 2023-04-26 DIAGNOSIS — Z823 Family history of stroke: Secondary | ICD-10-CM | POA: Diagnosis not present

## 2023-04-26 DIAGNOSIS — F32A Depression, unspecified: Secondary | ICD-10-CM | POA: Diagnosis present

## 2023-04-26 DIAGNOSIS — Z66 Do not resuscitate: Secondary | ICD-10-CM | POA: Diagnosis present

## 2023-04-26 DIAGNOSIS — I5033 Acute on chronic diastolic (congestive) heart failure: Secondary | ICD-10-CM

## 2023-04-26 DIAGNOSIS — E1122 Type 2 diabetes mellitus with diabetic chronic kidney disease: Secondary | ICD-10-CM | POA: Diagnosis present

## 2023-04-26 DIAGNOSIS — R350 Frequency of micturition: Secondary | ICD-10-CM | POA: Diagnosis present

## 2023-04-26 DIAGNOSIS — E1121 Type 2 diabetes mellitus with diabetic nephropathy: Secondary | ICD-10-CM | POA: Diagnosis not present

## 2023-04-26 DIAGNOSIS — R0602 Shortness of breath: Secondary | ICD-10-CM | POA: Diagnosis present

## 2023-04-26 DIAGNOSIS — R0902 Hypoxemia: Secondary | ICD-10-CM | POA: Diagnosis present

## 2023-04-26 DIAGNOSIS — N401 Enlarged prostate with lower urinary tract symptoms: Secondary | ICD-10-CM | POA: Diagnosis present

## 2023-04-26 DIAGNOSIS — N400 Enlarged prostate without lower urinary tract symptoms: Secondary | ICD-10-CM | POA: Insufficient documentation

## 2023-04-26 DIAGNOSIS — Z7901 Long term (current) use of anticoagulants: Secondary | ICD-10-CM | POA: Diagnosis not present

## 2023-04-26 DIAGNOSIS — Z888 Allergy status to other drugs, medicaments and biological substances status: Secondary | ICD-10-CM | POA: Diagnosis not present

## 2023-04-26 DIAGNOSIS — I482 Chronic atrial fibrillation, unspecified: Secondary | ICD-10-CM

## 2023-04-26 DIAGNOSIS — I5023 Acute on chronic systolic (congestive) heart failure: Secondary | ICD-10-CM | POA: Diagnosis not present

## 2023-04-26 DIAGNOSIS — E785 Hyperlipidemia, unspecified: Secondary | ICD-10-CM | POA: Diagnosis present

## 2023-04-26 DIAGNOSIS — Z79899 Other long term (current) drug therapy: Secondary | ICD-10-CM | POA: Diagnosis not present

## 2023-04-26 DIAGNOSIS — I2510 Atherosclerotic heart disease of native coronary artery without angina pectoris: Secondary | ICD-10-CM | POA: Diagnosis present

## 2023-04-26 DIAGNOSIS — M109 Gout, unspecified: Secondary | ICD-10-CM | POA: Diagnosis present

## 2023-04-26 DIAGNOSIS — Z8673 Personal history of transient ischemic attack (TIA), and cerebral infarction without residual deficits: Secondary | ICD-10-CM | POA: Diagnosis not present

## 2023-04-26 DIAGNOSIS — I10 Essential (primary) hypertension: Secondary | ICD-10-CM

## 2023-04-26 DIAGNOSIS — Z9049 Acquired absence of other specified parts of digestive tract: Secondary | ICD-10-CM | POA: Diagnosis not present

## 2023-04-26 LAB — BASIC METABOLIC PANEL
Anion gap: 11 (ref 5–15)
BUN: 39 mg/dL — ABNORMAL HIGH (ref 8–23)
CO2: 19 mmol/L — ABNORMAL LOW (ref 22–32)
Calcium: 9.2 mg/dL (ref 8.9–10.3)
Chloride: 109 mmol/L (ref 98–111)
Creatinine, Ser: 1.97 mg/dL — ABNORMAL HIGH (ref 0.61–1.24)
GFR, Estimated: 33 mL/min — ABNORMAL LOW (ref >60)
Glucose, Bld: 107 mg/dL — ABNORMAL HIGH (ref 70–99)
Potassium: 4.1 mmol/L (ref 3.5–5.1)
Sodium: 139 mmol/L (ref 135–145)

## 2023-04-26 LAB — CBC
HCT: 38.9 % — ABNORMAL LOW (ref 39.0–52.0)
Hemoglobin: 12.6 g/dL — ABNORMAL LOW (ref 13.0–17.0)
MCH: 30.2 pg (ref 26.0–34.0)
MCHC: 32.4 g/dL (ref 30.0–36.0)
MCV: 93.3 fL (ref 80.0–100.0)
Platelets: 143 10*3/uL — ABNORMAL LOW (ref 150–400)
RBC: 4.17 MIL/uL — ABNORMAL LOW (ref 4.22–5.81)
RDW: 15.7 % — ABNORMAL HIGH (ref 11.5–15.5)
WBC: 7.8 10*3/uL (ref 4.0–10.5)
nRBC: 0 % (ref 0.0–0.2)

## 2023-04-26 MED ORDER — ORAL CARE MOUTH RINSE: 15.0000 mL | OROMUCOSAL | Status: DC | PRN

## 2023-04-26 NOTE — Evaluation (Signed)
Physical Therapy Evaluation Patient Details Name: George Bolton MRN: [id number] DOB: 06-17-1941 Today's Date: 04/26/2023  History of Present Illness  Pt is an 82 y/o M admitted on 04/25/23 after presenting with c/o worsening dyspnea over the last couple days with associated orthopnea & worsening LE edema. Pt is being treated for acute on chronic diastolic CHF. PMH: diastolic CHF, chronic a-fib on Eliquis, gout, CKD 4, HTN, embolic stroke, RLE tibial plateau fx (02/14/23)   Clinical Impression  Pt seen for PT evaluation with pt eager to use BSC 2/2 need to have BM. Pt requires assistance to transfer to sitting EOB, transfers STS from EOB with RW but fatigues quickly with labored breathing with pt reporting he's feeling weak, so returned to sitting. HR up to 137 bpm with standing attempt. Pt unsafe to attempt getting to [facility] so pt assisted on bedpan. Pt would benefit from ongoing PT services to address strengthening, balance, transfers, & gait as able.    Assistance Recommended at Discharge Frequent or constant Supervision/Assistance  If plan is discharge home, recommend the following:  Can travel by private vehicle  A lot of help with bathing/dressing/bathroom;Assist for transportation;A lot of help with walking and/or transfers;Assistance with cooking/housework;Help with stairs or ramp for entrance;Direct supervision/assist for medications management   No    Equipment Recommendations None recommended by PT (TBD in next venue)  Recommendations for Other Services       Functional Status Assessment Patient has had a recent decline in their functional status and demonstrates the ability to make significant improvements in function in a reasonable and predictable amount of time.     Precautions / Restrictions Precautions Precautions: Fall Restrictions Weight Bearing Restrictions: Yes RLE Weight Bearing: Weight bearing as tolerated      Mobility  Bed Mobility Overal bed mobility: Needs  Assistance Bed Mobility: Rolling, Supine to Sit, Sit to Supine Rolling: Min assist   Supine to sit: Min assist, HOB elevated Sit to supine: Mod assist, HOB elevated        Transfers Overall transfer level: Needs assistance Equipment used: Rolling walker (2 wheels) Transfers: Sit to/from Stand Sit to Stand: Mod assist, From elevated surface           General transfer comment: STS from elevated EOB with cuing for hand placement    Ambulation/Gait                  Stairs            Wheelchair Mobility     Tilt Bed    Modified Rankin (Stroke Patients Only)       Balance Overall balance assessment: Needs assistance Sitting-balance support: Feet supported, Bilateral upper extremity supported Sitting balance-Leahy Scale: Fair     Standing balance support: During functional activity, Bilateral upper extremity supported, Reliant on assistive device for balance Standing balance-Leahy Scale: Poor                               Pertinent Vitals/Pain Pain Assessment Pain Assessment: No/denies pain    Home Living Family/patient expects to be discharged to:: Skilled nursing facility                   Additional Comments: Pt is a participant in [facility] program    Prior Function Prior Level of Function : Needs assist             Mobility Comments: Pt has been in  SNF rehab following R tibial plateau fx & NWB.       Hand Dominance        Extremity/Trunk Assessment   Upper Extremity Assessment Upper Extremity Assessment: Generalized weakness    Lower Extremity Assessment Lower Extremity Assessment: Generalized weakness    Cervical / Trunk Assessment Cervical / Trunk Assessment: Kyphotic  Communication   Communication: No difficulties  Cognition Arousal/Alertness: Awake/alert Behavior During Therapy: WFL for tasks assessed/performed Overall Cognitive Status: No family/caregiver present to determine baseline cognitive  functioning Area of Impairment: Safety/judgement                         Safety/Judgement: Decreased awareness of safety              General Comments General comments (skin integrity, edema, etc.): Pt assisted onto bedpan. Pt on 2L/min via nasal cannula, max HR 137 bpm - nurse made aware of HR.    Exercises     Assessment/Plan    PT Assessment Patient needs continued PT services  PT Problem List Decreased strength;Cardiopulmonary status limiting activity;Decreased range of motion;Decreased knowledge of use of DME;Decreased activity tolerance;Decreased balance;Decreased safety awareness;Decreased mobility;Decreased knowledge of precautions       PT Treatment Interventions DME instruction;Therapeutic exercise;Balance training;Neuromuscular re-education;Stair training;Gait training;Functional mobility training;Patient/family education;Therapeutic activities;Modalities    PT Goals (Current goals can be found in the Care Plan section)  Acute Rehab PT Goals Patient Stated Goal: get stronger PT Goal Formulation: With patient Time For Goal Achievement: 05/10/23 Potential to Achieve Goals: Fair    Frequency Min 2X/week     Co-evaluation               AM-PAC PT "6 Clicks" Mobility  Outcome Measure Help needed turning from your back to your side while in a flat bed without using bedrails?: A Little Help needed moving from lying on your back to sitting on the side of a flat bed without using bedrails?: A Lot Help needed moving to and from a bed to a chair (including a wheelchair)?: Total Help needed standing up from a chair using your arms (e.g., wheelchair or bedside chair)?: A Lot Help needed to walk in hospital room?: Total Help needed climbing 3-5 steps with a railing? : Total 6 Click Score: 10    End of Session Equipment Utilized During Treatment: Gait belt Activity Tolerance: Patient limited by fatigue (limited by need to have BM) Patient left: in  bed;with call bell/phone within reach;with bed alarm set Nurse Communication: Mobility status (HR) PT Visit Diagnosis: Muscle weakness (generalized) (M62.81);Difficulty in walking, not elsewhere classified (R26.2);Unsteadiness on feet (R26.81);Other abnormalities of gait and mobility (R26.89)    Time: 1610-9604 PT Time Calculation (min) (ACUTE ONLY): 10 min   Charges:   PT Evaluation $PT Eval Moderate Complexity: 1 Mod   PT General Charges $$ ACUTE PT VISIT: 1 Visit         Aleda Grana, PT, DPT 04/26/23, 3:54 PM   Sandi Mariscal 04/26/2023, 3:48 PM

## 2023-04-26 NOTE — Evaluation (Signed)
Occupational Therapy Evaluation Patient Details Name: George Bolton MRN: [id number] DOB: [birth year] Today's Date: 04/26/2023   History of Present Illness Pt is an 82 y/o M admitted on 04/25/23 after presenting with c/o worsening dyspnea over the last couple days with associated orthopnea & worsening LE edema. Pt is being treated for acute on chronic diastolic CHF. PMH: diastolic CHF, chronic a-fib on Eliquis, gout, CKD 4, HTN, embolic stroke, RLE tibial plateau fx (02/14/23)   Clinical Impression   Patient presenting with decreased Ind in self care,balance, functional mobility/transfers, endurance, and safety awareness. Patient from [facility] recently getting rehab services. Pt reports living with wife at baseline but has been at SNF for ~ 7- 8 weeks and has not ambulates since before that time. Pt needing mod A for bed mobility and initially min - mod A for anterior weight shift secondary to posterior bias while seated on EOB. Sit <>stand x 2 reps with max A for ~ 30 seconds. Pt unable to take steps or off load weight during session. Mod - max A to return to supine with cuing for PLB secondary to being on 2Ls via [place]. OT recommends pt return to SNF for rehab. Patient will benefit from acute OT to increase overall independence in the areas of ADLs, functional mobility, and safety awareness in order to safely discharge.     Recommendations for follow up therapy are one component of a multi-disciplinary discharge planning process, led by the attending physician.  Recommendations may be updated based on patient status, additional functional criteria and insurance authorization.      Patient can return home with the following Two people to help with walking and/or transfers;A lot of help with bathing/dressing/bathroom;Assistance with cooking/housework;Assist for transportation;Help with stairs or ramp for entrance    Functional Status Assessment  Patient has had a recent decline in their  functional status and demonstrates the ability to make significant improvements in function in a reasonable and predictable amount of time.  Equipment Recommendations  Other (comment) (defer to next venue of care)       Precautions / Restrictions Precautions Precautions: Fall Restrictions Weight Bearing Restrictions: Yes RLE Weight Bearing: Weight bearing as tolerated      Mobility Bed Mobility Overal bed mobility: Needs Assistance Bed Mobility: Supine to Sit, Sit to Supine     Supine to sit: Mod assist Sit to supine: Mod assist        Transfers Overall transfer level: Needs assistance Equipment used: Rolling walker (2 wheels) Transfers: Sit to/from Stand Sit to Stand: Max assist                  Balance Overall balance assessment: Needs assistance Sitting-balance support: Feet supported, Bilateral upper extremity supported Sitting balance-Leahy Scale: Fair   Postural control: Posterior lean Standing balance support: During functional activity, Bilateral upper extremity supported, Reliant on assistive device for balance Standing balance-Leahy Scale: Poor                             ADL either performed or assessed with clinical judgement   ADL Overall ADL's : Needs assistance/impaired                                       General ADL Comments: Min A to don hospital gown. Max- total A for LB self care.  Vision Patient Visual Report: No change from baseline              Pertinent Vitals/Pain Pain Assessment Pain Assessment: No/denies pain     Hand Dominance Right   Extremity/Trunk Assessment Upper Extremity Assessment Upper Extremity Assessment: Generalized weakness   Lower Extremity Assessment Lower Extremity Assessment: Generalized weakness   Cervical / Trunk Assessment Cervical / Trunk Assessment: Kyphotic   Communication Communication Communication: No difficulties   Cognition Arousal/Alertness:  Awake/alert Behavior During Therapy: WFL for tasks assessed/performed Overall Cognitive Status: No family/caregiver present to determine baseline cognitive functioning Area of Impairment: Safety/judgement                         Safety/Judgement: Decreased awareness of safety           General Comments  Pt assisted onto bedpan. Pt on 2L/min via nasal cannula, max HR 137 bpm - nurse made aware of HR.            Home Living Family/patient expects to be discharged to:: Skilled nursing facility                                 Additional Comments: [facility]      Prior Functioning/Environment Prior Level of Function : Needs assist             Mobility Comments: Pt has been in SNF rehab following R tibial plateau fx & NWB( pt is now WBAT) ADLs Comments: Mod Ind with ADLs, uses a tub transfer bench to bathe and takes sponge baths, sometimes requires assist with LB dressing prior to April admission. He reports he has not ambulates in 2-3 months.        OT Problem List: Decreased strength;Decreased activity tolerance;Decreased safety awareness;Impaired balance (sitting and/or standing);Decreased knowledge of use of DME or AE      OT Treatment/Interventions: Self-care/ADL training;Therapeutic exercise;Therapeutic activities;Energy conservation;DME and/or AE instruction;Patient/family education;Balance training    OT Goals(Current goals can be found in the care plan section) Acute Rehab OT Goals Patient Stated Goal: to walk OT Goal Formulation: With patient Time For Goal Achievement: 05/10/23 Potential to Achieve Goals: Poor ADL Goals Pt Will Perform Grooming: with supervision;sitting Pt Will Perform Lower Body Dressing: with mod assist;sit to/from stand Pt Will Transfer to Toilet: bedside commode;with min assist Pt Will Perform Toileting - Clothing Manipulation and hygiene: with min assist;sit to/from stand  OT Frequency:  Min 2X/week       AM-PAC OT "6 Clicks" Daily Activity     Outcome Measure Help from another person eating meals?: A Little Help from another person taking care of personal grooming?: A Little Help from another person toileting, which includes using toliet, bedpan, or urinal?: Total Help from another person bathing (including washing, rinsing, drying)?: A Lot Help from another person to put on and taking off regular upper body clothing?: A Little Help from another person to put on and taking off regular lower body clothing?: Total 6 Click Score: 13   End of Session Nurse Communication: Mobility status  Activity Tolerance: Patient limited by fatigue Patient left: in bed;with call bell/phone within reach;with bed alarm set  OT Visit Diagnosis: Unsteadiness on feet (R26.81);Repeated falls (R29.6);Muscle weakness (generalized) (M62.81)                Time: 6045-4098 OT Time Calculation (min): 27 min Charges:  OT General Charges $OT Visit: 1 Visit OT Evaluation $OT Eval Moderate Complexity: 1 Mod OT Treatments $Therapeutic Activity: 8-22 mins  Jackquline Denmark, MS, OTR/L , CBIS ascom [phone]  04/26/23, 4:05 PM

## 2023-04-26 NOTE — Assessment & Plan Note (Signed)
-   We will continue Toprol-XL and Imdur. - He will be placed on as needed sublingual nitroglycerin and morphine sulfate for chest pain.

## 2023-04-26 NOTE — TOC Initial Note (Signed)
Transition of Care [facility]) - Initial/Assessment Note    Patient Details  Name: George Bolton MRN: [id number] Date of Birth: 03/13/1941  [facility]) CM/SW Contact:    Margarito Liner, LCSW Phone Number: 04/26/2023, 12:19 PM  Clinical Narrative:  CSW met with patient. No supports at bedside. CSW introduced role and explained that discharge planning would be discussed. Patient is currently a rehab resident at [facility]. CSW spoke to Robbie Lis, [employer] with [facility]. Patient has been unable to stand for the past 6-7 weeks due to leg fracture but recently was able to stand for 2 minutes. They are planning on him returning to [facility] when stable. Patient stated he plans to return home at discharge. PT and OT were consulted today. Will discuss with patient again after they evaluate him. Left message for Charisse March after conversation with patient to provide update. No further concerns. CSW encouraged patient to contact CSW as needed. CSW will continue to follow patient for support and facilitate discharge once medically stable. [facility] will transport as long as he does not discharge on 7/4.                 Expected Discharge Plan: Skilled Nursing Facility Barriers to Discharge: Continued Medical Work up   Patient Goals and CMS Choice            Expected Discharge Plan and Services     Post Acute Care Choice:  (TBD) Living arrangements for the past 2 months: Single Family Home                                      Prior Living Arrangements/Services Living arrangements for the past 2 months: Single Family Home   Patient language and need for interpreter reviewed:: Yes Do you feel safe going back to the place where you live?: Yes      Need for Family Participation in Patient Care: Yes (Comment)     Criminal Activity/Legal Involvement Pertinent to Current Situation/Hospitalization: No - Comment as needed  Activities of Daily Living Home Assistive  Devices/Equipment: Environmental consultant (specify type), Wheelchair ADL Screening (condition at time of admission) Patient's cognitive ability adequate to safely complete daily activities?: No Is the patient deaf or have difficulty hearing?: No Does the patient have difficulty seeing, even when wearing glasses/contacts?: No Does the patient have difficulty concentrating, remembering, or making decisions?: No Patient able to express need for assistance with ADLs?: Yes Does the patient have difficulty dressing or bathing?: No Independently performs ADLs?: Yes (appropriate for developmental age) Does the patient have difficulty walking or climbing stairs?: Yes Weakness of Legs: Both Weakness of Arms/Hands: None  Permission Sought/Granted Permission sought to share information with : Oceanographer granted to share information with : Yes, Verbal Permission Granted     Permission granted to share info w AGENCY: [facility]        Emotional Assessment Appearance:: Appears stated age Attitude/Demeanor/Rapport: Engaged Affect (typically observed): Appropriate, Calm Orientation: : Oriented to Self, Oriented to Place, Oriented to  Time, Oriented to Situation Alcohol / Substance Use: Not Applicable Psych Involvement: No (comment)  Admission diagnosis:  Hypoxia [R09.02] Acute on chronic diastolic CHF (congestive heart failure) (HCC) [I50.33] Acute on chronic congestive heart failure, unspecified heart failure type (HCC) [I50.9] Acute on chronic systolic CHF (congestive heart failure) (HCC) [I50.23] Patient Active Problem List   Diagnosis Date  Noted   Coronary artery disease 04/26/2023   Essential hypertension 04/26/2023   BPH (benign prostatic hyperplasia) 04/26/2023   Depression 04/26/2023   Acute on chronic systolic CHF (congestive heart failure) (HCC) 04/26/2023   Acute on chronic diastolic CHF (congestive heart failure) (HCC) 04/25/2023   SOB (shortness of breath) 09/18/2022    Acute on chronic diastolic (congestive) heart failure (HCC) 07/16/2022   Atrial fibrillation, chronic (HCC) 07/16/2022   Dyslipidemia 07/16/2022   CKD (chronic kidney disease), stage IV (HCC) 07/16/2022   Left leg swelling    NSVT (nonsustained ventricular tachycardia) (HCC) 12/10/2021   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 ([address]), [place] - [address] [place] ([place]) [place] [address] Phone: [phone] Fax: [phone]  [facility] [address] [place] - [address] [place] [address] Phone: [phone] Fax: [phone]  [facility] - [place], [place] - [address] [place] [address] Phone: [phone] Fax: [phone]     Social Determinants of Health (SDOH) Social History: SDOH Screenings   Food Insecurity: No Food Insecurity (04/26/2023)  Housing: Low Risk  (04/26/2023)  Transportation Needs: No Transportation Needs (04/26/2023)  Utilities: Not At Risk (04/26/2023)  Tobacco Use: Low Risk  (04/25/2023)   SDOH Interventions:     Readmission Risk Interventions    09/20/2022   10:22 AM  Readmission Risk Prevention Plan  Post Dischage Appt Complete  Medication Screening Complete  Transportation Screening Complete

## 2023-04-26 NOTE — Assessment & Plan Note (Signed)
-   We will continue Toprol-XL and Eliquis.  His rate is currently controlled.

## 2023-04-26 NOTE — Discharge Instructions (Signed)

## 2023-04-26 NOTE — Assessment & Plan Note (Signed)
-  The patient will be admitted to a cardiac telemetry bed. - We will continue diuresis with IV Lasix. - We Will follow serial troponins. - We will follow I's and O's and daily weights.

## 2023-04-26 NOTE — Assessment & Plan Note (Signed)
-   We will continue Lexapro. 

## 2023-04-26 NOTE — Progress Notes (Addendum)
Progress Note   Patient: George Bolton ZOX:[id number] DOB: June 08, 1941 DOA: 04/25/2023     0 DOS: the patient was seen and examined on 04/26/2023   Subjective:  Patient seen and examined at bedside this morning Feeling weak requiring 1.5 L of intranasal oxygen He tells me his respiratory function is improving He does not use oxygen at home Renal function has improved from 2.5-1.97.   Brief hospital course: From HPI "George Bolton is a 82 y.o. male with medical history significant for diastolic CHF, chronic atrial fibrillation on Eliquis, gout, stage IV CKD, hypertension and history of embolic stroke, who presented to the emergency room with a [place] of worsening dyspnea over the last couple of days with associated orthopnea and worsening lower extremity edema.  He admitted to dry cough and occasional wheezing with mild chest tightness with deep breathing.  No fever or chills.  No nausea or vomiting or abdominal pain.  He has been having mild urinary frequency and urgency.  No dysuria, oliguria or hematuria or flank pain.   ED Course: When he came to the ER, BP was 177/116 with a respiratory rate of 29 and otherwise normal vital signs.  He was placed on 2 L O2 by nasal cannula and pulse currently improved from 92 to 97%.  Labs revealed a CO2 of 20 and a BUN of 39 with creatinine 1.96, better than previous levels with stage IV CKD, and otherwise normal CMP.  CBC showed hemoglobin 12.9 hematocrit 41 with platelets of 139.  BNP was 924.7 and high sensitive troponin I was 29.  UA showed large leukocytes and 100 protein.  EKG as reviewed by me : EKG showed atrial fibrillation with controlled ventricular response of 85 with Q waves inferiorly. Imaging: 2 view chest x-ray showed no acute cardiopulmonary disease.   The patient was given 40 mg of IV Lasix.  He will be admitted to a medical telemetry observation bed for further evaluation and management."   Assessment and Plan:  Acute on chronic  diastolic CHF (congestive heart failure) (HCC) - Continue IV Lasix - We Will follow serial troponins. Continue to wean off oxygen as tolerated Monitor input and output closely Check weight daily I reviewed patient's chest x-ray personally that showed no acute pulmonary pathology I discussed the plan of care with case management as well as nursing staff   Atrial fibrillation, chronic (HCC) - Currently rate controlled Continue metoprolol XL as well as Eliquis.     Type 2 diabetes mellitus with diabetic nephropathy (HCC) Continue on supplemental coverage with NovoLog.   Essential hypertension Continue his antihypertensive therapy.   Dyslipidemia - We will continue statin therapy.   Coronary artery disease - We will continue Toprol-XL and Imdur. - He will be placed on as needed sublingual nitroglycerin and morphine sulfate for chest pain.   Depression - We will continue Lexapro.   BPH (benign prostatic hyperplasia) - We will continue Proscar.     DVT prophylaxis: Eliquis.  Advanced Care Planning:  Code Status: The patient is an DRN and DNI.  He has an out of facility DNR form.  Family Communication:  The plan of care was discussed in details with the patient    Physical Exam:  GENERAL: Elderly male in mild respiratory distress on intranasal oxygen  EYES: Pupils equal, round, reactive to light and accommodation.  HEENT: Head atraumatic, normocephalic.  NECK:  Supple, no jugular venous distention.  LUNGS: Clear entry decreased especially at the bases CARDIOVASCULAR: Regular rate and  rhythm, S1, S2 normal.  ABDOMEN: Soft, nondistended, nontender. Bowel sounds present. No organomegaly or mass.  EXTREMITIES: Bilateral lower extremity pitting edema NEUROLOGIC: Cranial nerves II through XII are intact. Muscle strength 5/5 in all extremities. Sensation intact. Gait not checked.  PSYCHIATRIC: The patient is alert and oriented x 3.  Normal affect and good eye contact. SKIN: No  obvious rash, lesion, or ulcer.   Data Reviewed: I reviewed patient's lab results showing improvement in creatinine from 2.5  2 months ago up to 1.97  Vitals:   04/26/23 0022 04/26/23 0548 04/26/23 0732 04/26/23 1219  BP: (!) 171/107 (!) 161/113 (!) 172/104 (!) 141/102  Pulse: 85 82 93 77  Resp: 18 18 (!) 25 (!) 25  Temp: 97.7 F (36.5 C) 97.6 F (36.4 C) (!) 97.5 F (36.4 C) (!) 97.4 F (36.3 C)  TempSrc: Oral     SpO2: 98% 98% 97% 99%  Weight:      Height:         Author: Loyce Dys, MD 04/26/2023 12:51 PM  For on call review [email or web address].

## 2023-04-26 NOTE — Assessment & Plan Note (Signed)
-   We will continue Proscar. 

## 2023-04-26 NOTE — Assessment & Plan Note (Signed)
-   The patient will be placed on supplemental coverage with NovoLog. 

## 2023-04-26 NOTE — Assessment & Plan Note (Signed)
-   We will continue his antihypertensive therapy. 

## 2023-04-26 NOTE — Assessment & Plan Note (Signed)
-   We will continue statin therapy. 

## 2023-04-27 LAB — CBC WITH DIFFERENTIAL/PLATELET
Abs Immature Granulocytes: 0.04 10*3/uL (ref 0.00–0.07)
Basophils Absolute: 0 10*3/uL (ref 0.0–0.1)
Basophils Relative: 0 %
Eosinophils Absolute: 0.2 10*3/uL (ref 0.0–0.5)
Eosinophils Relative: 2 %
HCT: 37.7 % — ABNORMAL LOW (ref 39.0–52.0)
Hemoglobin: 12.4 g/dL — ABNORMAL LOW (ref 13.0–17.0)
Immature Granulocytes: 1 %
Lymphocytes Relative: 12 %
Lymphs Abs: 0.9 10*3/uL (ref 0.7–4.0)
MCH: 30.8 pg (ref 26.0–34.0)
MCHC: 32.9 g/dL (ref 30.0–36.0)
MCV: 93.5 fL (ref 80.0–100.0)
Monocytes Absolute: 0.9 10*3/uL (ref 0.1–1.0)
Monocytes Relative: 11 %
Neutro Abs: 5.8 10*3/uL (ref 1.7–7.7)
Neutrophils Relative %: 74 %
Platelets: 134 10*3/uL — ABNORMAL LOW (ref 150–400)
RBC: 4.03 MIL/uL — ABNORMAL LOW (ref 4.22–5.81)
RDW: 15.5 % (ref 11.5–15.5)
WBC: 7.8 10*3/uL (ref 4.0–10.5)
nRBC: 0 % (ref 0.0–0.2)

## 2023-04-27 LAB — BASIC METABOLIC PANEL
Anion gap: 9 (ref 5–15)
BUN: 42 mg/dL — ABNORMAL HIGH (ref 8–23)
CO2: 20 mmol/L — ABNORMAL LOW (ref 22–32)
Calcium: 9.3 mg/dL (ref 8.9–10.3)
Chloride: 107 mmol/L (ref 98–111)
Creatinine, Ser: 1.85 mg/dL — ABNORMAL HIGH (ref 0.61–1.24)
GFR, Estimated: 35 mL/min — ABNORMAL LOW (ref >60)
Glucose, Bld: 111 mg/dL — ABNORMAL HIGH (ref 70–99)
Potassium: 3.6 mmol/L (ref 3.5–5.1)
Sodium: 136 mmol/L (ref 135–145)

## 2023-04-27 LAB — URINE CULTURE

## 2023-04-27 MED ORDER — TRAMADOL HCL 50 MG PO TABS: 50.0000 mg | ORAL_TABLET | Freq: Four times a day (QID) | ORAL | 0 refills | Status: DC | PRN

## 2023-04-27 MED ORDER — TRAZODONE HCL 50 MG PO TABS: 25.0000 mg | ORAL_TABLET | Freq: Every evening | ORAL | 0 refills | Status: DC | PRN

## 2023-04-27 NOTE — TOC Progression Note (Signed)
Transition of Care [facility]) - Progression Note    Patient Details  Name: George Bolton MRN: [id number] Date of Birth: [birth year]  Transition of Care [facility]) CM/SW Contact  Margarito Liner, LCSW Phone Number: 04/27/2023, 11:36 AM  Clinical Narrative:   Patient is agreeable to return to [facility] for continued rehab when stable.  Expected Discharge Plan: Skilled Nursing Facility Barriers to Discharge: Continued Medical Work up  Expected Discharge Plan and Services     Post Acute Care Choice:  (TBD) Living arrangements for the past 2 months: Single Family Home                                       Social Determinants of Health (SDOH) Interventions SDOH Screenings   Food Insecurity: No Food Insecurity (04/26/2023)  Housing: Low Risk  (04/26/2023)  Transportation Needs: No Transportation Needs (04/26/2023)  Utilities: Not At Risk (04/26/2023)  Tobacco Use: Low Risk  (04/25/2023)    Readmission Risk Interventions    09/20/2022   10:22 AM  Readmission Risk Prevention Plan  Post Dischage Appt Complete  Medication Screening Complete  Transportation Screening Complete

## 2023-04-27 NOTE — Progress Notes (Signed)
George Bolton  A and O x 4. VSS. Pt tolerating diet well. No complaints of pain or nausea. IV removed intact, prescriptions given. Pt voiced understanding of discharge instructions with no further questions. Pt discharged via wheelchair.    Allergies as of 04/27/2023       Reactions   Cortisone Anaphylaxis   Triamcinolone    Other reaction(s): Unknown        Medication List     STOP taking these medications    (feeding supplement) PROSource Plus liquid   cephALEXin 500 MG capsule Commonly known as: KEFLEX   famotidine 20 MG tablet Commonly known as: PEPCID   fexofenadine 180 MG tablet Commonly known as: ALLEGRA   multivitamin with minerals Tabs tablet       TAKE these medications    acetaminophen 325 MG tablet Commonly known as: TYLENOL Take 2 tablets (650 mg total) by mouth every 6 (six) hours as needed for mild pain (or Fever >/= 101).   allopurinol 100 MG tablet Commonly known as: ZYLOPRIM Take 1 tablet (100 mg total) by mouth daily. Home med.   apixaban 2.5 MG Tabs tablet Commonly known as: ELIQUIS Take 2.5 mg by mouth 2 (two) times daily.   cloNIDine 0.1 mg/24hr patch Commonly known as: CATAPRES - Dosed in mg/24 hr Place 0.1 mg onto the skin once a week.   finasteride 5 MG tablet Commonly known as: PROSCAR Take 1 tablet (5 mg total) by mouth daily.   furosemide 40 MG tablet Commonly known as: LASIX Take 40 mg by mouth daily.   hydrALAZINE 100 MG tablet Commonly known as: APRESOLINE Take 1 tablet (100 mg total) by mouth 3 (three) times daily.   isosorbide mononitrate 30 MG 24 hr tablet Commonly known as: IMDUR Take 30 mg by mouth 2 (two) times daily.   metoprolol succinate 100 MG 24 hr tablet Commonly known as: TOPROL-XL Take 1 tablet (100 mg total) by mouth every evening. Take with or immediately following a meal.  Home med.   nystatin powder Commonly known as: MYCOSTATIN/NYSTOP Apply 1 Application topically 2 (two) times daily.    rosuvastatin 20 MG tablet Commonly known as: Crestor Take 1 tablet (20 mg total) by mouth daily.   traMADol 50 MG tablet Commonly known as: ULTRAM Take 1 tablet (50 mg total) by mouth every 6 (six) hours as needed.   traZODone 50 MG tablet Commonly known as: DESYREL Take 0.5 tablets (25 mg total) by mouth at bedtime as needed for sleep.        Vitals:   04/27/23 1153 04/27/23 1219  BP: 110/79   Pulse: 85   Resp: 15   Temp:  97.6 F (36.4 C)  SpO2: 97% 95%    [name]

## 2023-04-27 NOTE — [place] FL2 (Signed)
[place] MEDICAID FL2 LEVEL OF CARE FORM     IDENTIFICATION  Patient Name: George Bolton Birthdate: [birth year] Sex: male Admission Date (Current Location): 04/25/2023  [facility] and [place] Number:  Chiropodist and Address:  [facility], [address] [place], [place] [address]      Provider Number: [id number]  Attending Physician Name and Address:  Loyce Dys, MD  Relative Name and Phone Number:       Current Level of Care: Hospital Recommended Level of Care: Skilled Nursing Facility Prior Approval Number:    Date Approved/Denied:   PASRR Number: [id number]  Discharge Plan: SNF    Current Diagnoses: Patient Active Problem List   Diagnosis Date Noted   Coronary artery disease 04/26/2023   Essential hypertension 04/26/2023   BPH (benign prostatic hyperplasia) 04/26/2023   Depression 04/26/2023   Acute on chronic systolic CHF (congestive heart failure) (HCC) 04/26/2023   Acute on chronic diastolic CHF (congestive heart failure) (HCC) 04/25/2023   SOB (shortness of breath) 09/18/2022   Acute on chronic diastolic (congestive) heart failure (HCC) 07/16/2022   Atrial fibrillation, chronic (HCC) 07/16/2022   Dyslipidemia 07/16/2022   CKD (chronic kidney disease), stage IV (HCC) 07/16/2022   Left leg swelling    NSVT (nonsustained ventricular tachycardia) (HCC) 12/10/2021   Type 2 diabetes mellitus with diabetic nephropathy (HCC) 12/10/2021   Elevated troponin 12/10/2021   History of stroke 12/10/2021   Osteoarthritis of knee 12/10/2021   Acute kidney injury superimposed on chronic kidney disease (HCC) 11/22/2021   Lymphedema 03/15/2018   Gout 01/25/2017   Hypertension 04/25/2015    Orientation RESPIRATION BLADDER Height & Weight     Self, Time, Situation, Place  O2 (Nasal Cannula 2 L) Incontinent, External catheter Weight: 181 lb 6.4 oz (82.3 kg) Height:  6\' 1"  (185.4 cm)  BEHAVIORAL SYMPTOMS/MOOD NEUROLOGICAL BOWEL  NUTRITION STATUS   (None)  (None) Continent Diet (2 gram sodium)  AMBULATORY STATUS COMMUNICATION OF NEEDS Skin   Extensive Assist Verbally Normal                       Personal Care Assistance Level of Assistance  Bathing, Feeding, Dressing Bathing Assistance: Maximum assistance Feeding assistance: Limited assistance Dressing Assistance: Maximum assistance     Functional Limitations Info  Sight, Hearing, Speech Sight Info: Adequate Hearing Info: Adequate Speech Info: Adequate    SPECIAL CARE FACTORS FREQUENCY  PT (By licensed PT), OT (By licensed OT)     PT Frequency: 5 x week OT Frequency: 5 x week            Contractures Contractures Info: Not present    Additional Factors Info  Code Status, Allergies Code Status Info: DNR Allergies Info: Cortisone, Triamcinolene           Current Medications (04/27/2023):  This is the current hospital active medication list Current Facility-Administered Medications  Medication Dose Route Frequency Provider Last Rate Last Admin   acetaminophen (TYLENOL) tablet 650 mg  650 mg Oral Q6H PRN Mansy, Jan A, MD       Or   acetaminophen (TYLENOL) suppository 650 mg  650 mg Rectal Q6H PRN Mansy, Jan A, MD       allopurinol (ZYLOPRIM) tablet 100 mg  100 mg Oral Daily Mansy, Jan A, MD   100 mg at 04/27/23 7846   apixaban (ELIQUIS) tablet 2.5 mg  2.5 mg Oral BID Mansy, Jan A, MD   2.5 mg at 04/27/23  1610   cloNIDine (CATAPRES - Dosed in mg/24 hr) patch 0.1 mg  0.1 mg Transdermal Weekly Mansy, Jan A, MD   0.1 mg at 04/26/23 1020   finasteride (PROSCAR) tablet 5 mg  5 mg Oral Daily Mansy, Jan A, MD   5 mg at 04/27/23 9604   furosemide (LASIX) injection 40 mg  40 mg Intravenous Q12H Mansy, Jan A, MD   40 mg at 04/27/23 5409   hydrALAZINE (APRESOLINE) tablet 100 mg  100 mg Oral TID Mansy, Jan A, MD   100 mg at 04/27/23 8119   isosorbide mononitrate (IMDUR) 24 hr tablet 30 mg  30 mg Oral BID Mansy, Jan A, MD   30 mg at 04/27/23 1478    magnesium hydroxide (MILK OF MAGNESIA) suspension 30 mL  30 mL Oral Daily PRN Mansy, Jan A, MD       metoprolol succinate (TOPROL-XL) 24 hr tablet 100 mg  100 mg Oral QPM Mansy, Jan A, MD   100 mg at 04/26/23 1803   ondansetron (ZOFRAN) tablet 4 mg  4 mg Oral Q6H PRN Mansy, Jan A, MD       Or   ondansetron [facility]) injection 4 mg  4 mg Intravenous Q6H PRN Mansy, Jan A, MD       Oral care mouth rinse  15 mL Mouth Rinse PRN Mansy, Jan A, MD       rosuvastatin (CRESTOR) tablet 20 mg  20 mg Oral Daily Mansy, Jan A, MD   20 mg at 04/27/23 2956   traMADol (ULTRAM) tablet 50 mg  50 mg Oral Q6H PRN Mansy, Jan A, MD       traZODone (DESYREL) tablet 25 mg  25 mg Oral QHS PRN Mansy, Vernetta Honey, MD         Discharge Medications: Please see discharge summary for a list of discharge medications.  Relevant Imaging Results:  Relevant Lab Results:   Additional Information SS#: [id number]  Margarito Liner, LCSW

## 2023-04-27 NOTE — Progress Notes (Signed)
RN called report to while [employer]. Report given to D Mayers LPN.

## 2023-04-27 NOTE — Discharge Summary (Signed)
Physician Discharge Summary   Patient: George Bolton MRN: [id number] DOB: 27-Oct-1940  Admit date:     04/25/2023  Discharge date: 04/27/23  Discharge Physician: Loyce Dys   PCP: Unice Bailey, MD    Discharge Diagnoses: Acute on chronic diastolic CHF (congestive heart failure) (HCC) Atrial fibrillation, chronic (HCC) Type 2 diabetes mellitus with diabetic nephropathy (HCC) Essential hypertension Dyslipidemia Coronary artery disease Depression BPH (benign prostatic hyperplasia)   Hospital Course: George Bolton is a 82 y.o. male with medical history significant for diastolic CHF, chronic atrial fibrillation on Eliquis, gout, stage IV CKD, hypertension and history of embolic stroke, who presented to the emergency room with a complaint of worsening dyspnea over the last couple of days with associated orthopnea and worsening lower extremity edema.    ED Course: When he came to the ER, BP was 177/116 with a respiratory rate of 29 and otherwise normal vital signs.  He was placed on 2 L O2 by nasal cannula and pulse currently improved from 92 to 97%.  Labs revealed a CO2 of 20 and a BUN of 39 with creatinine 1.96, better than previous levels with stage IV CKD, and otherwise normal CMP.  CBC showed hemoglobin 12.9 hematocrit 41 with platelets of 139.  BNP was 924.7 and high sensitive troponin I was 29.  EKG  : EKG showed atrial fibrillation with controlled ventricular response of 85 with Q waves inferiorly. Imaging: 2 view chest x-ray showed no acute cardiopulmonary disease. Patient was subsequently admitted for acute on chronic exacerbation of CHF requiring IV diuresis.  His respiratory function improved and patient has been weaned off oxygen he is therefore being discharged to continue current medications and to follow-up with his outpatient physicians.  Consultants: NONE Procedures performed: none  Disposition: Skilled nursing facility Diet recommendation:  Discharge Diet Orders  (From admission, onward)     Start     Ordered   04/27/23 0000  Diet - low sodium heart healthy        04/27/23 1233           Cardiac diet DISCHARGE MEDICATION: Allergies as of 04/27/2023       Reactions   Cortisone Anaphylaxis   Triamcinolone    Other reaction(s): Unknown        Medication List     STOP taking these medications    (feeding supplement) PROSource Plus liquid   cephALEXin 500 MG capsule Commonly known as: KEFLEX   famotidine 20 MG tablet Commonly known as: PEPCID   fexofenadine 180 MG tablet Commonly known as: ALLEGRA   multivitamin with minerals Tabs tablet       TAKE these medications    acetaminophen 325 MG tablet Commonly known as: TYLENOL Take 2 tablets (650 mg total) by mouth every 6 (six) hours as needed for mild pain (or Fever >/= 101).   allopurinol 100 MG tablet Commonly known as: ZYLOPRIM Take 1 tablet (100 mg total) by mouth daily. Home med.   apixaban 2.5 MG Tabs tablet Commonly known as: ELIQUIS Take 2.5 mg by mouth 2 (two) times daily.   cloNIDine 0.1 mg/24hr patch Commonly known as: CATAPRES - Dosed in mg/24 hr Place 0.1 mg onto the skin once a week.   finasteride 5 MG tablet Commonly known as: PROSCAR Take 1 tablet (5 mg total) by mouth daily.   furosemide 40 MG tablet Commonly known as: LASIX Take 40 mg by mouth daily.   hydrALAZINE 100 MG tablet Commonly known as: APRESOLINE  Take 1 tablet (100 mg total) by mouth 3 (three) times daily.   isosorbide mononitrate 30 MG 24 hr tablet Commonly known as: IMDUR Take 30 mg by mouth 2 (two) times daily.   metoprolol succinate 100 MG 24 hr tablet Commonly known as: TOPROL-XL Take 1 tablet (100 mg total) by mouth every evening. Take with or immediately following a meal.  Home med.   nystatin powder Commonly known as: MYCOSTATIN/NYSTOP Apply 1 Application topically 2 (two) times daily.   rosuvastatin 20 MG tablet Commonly known as: Crestor Take 1 tablet (20 mg  total) by mouth daily.   traMADol 50 MG tablet Commonly known as: ULTRAM Take 50 mg by mouth every 6 (six) hours as needed.   traZODone 50 MG tablet Commonly known as: DESYREL Take 0.5 tablets (25 mg total) by mouth at bedtime as needed for sleep.        Contact information for after-discharge care     Destination     [facility] .   Service: Skilled Nursing Contact information: [address] [place] [address] [phone]                    Discharge Exam: Ceasar Mons Weights   04/25/23 2007  Weight: 82.3 kg   GENERAL: Elderly male lying in bed in no distress EYES: Pupils equal, round, reactive to light and accommodation.  HEENT: Head atraumatic, normocephalic.  NECK:  Supple, no jugular venous distention.  LUNGS: Clear entry decreased especially at the bases CARDIOVASCULAR: Regular rate and rhythm, S1, S2 normal.  ABDOMEN: Soft, nondistended, nontender. Bowel sounds present.  Extremities: Bilateral lower extremity pitting edema significantly better NEUROLOGIC: Cranial nerves II through XII are intact. Muscle strength 5/5 in all extremities. Sensation intact. Gait not checked.  PSYCHIATRIC: The patient is alert and oriented x 3.  Normal affect and good eye contact. SKIN: No obvious rash, lesion, or ulcer.     Condition at discharge: good   Discharge time spent: 35 minutes spent discharging patient  Signed: Loyce Dys, MD [facility] 04/27/2023

## 2023-04-27 NOTE — TOC Transition Note (Signed)
Transition of Care [facility]) - CM/SW Discharge Note   Patient Details  Name: George Bolton MRN: [id number] Date of Birth: 24-Sep-1941  Transition of [facility]) CM/SW Contact:  Margarito Liner, LCSW Phone Number: 04/27/2023, 12:55 PM   Clinical Narrative: Patient has orders to discharge back to [facility] today. RN will call report to [phone] (Room 219A). [facility] will pick him up around 1:30 and take him to their office before transporting him back to the SNF. No further concerns. CSW signing off.    Final next level of care: Skilled Nursing Facility Barriers to Discharge: Barriers Resolved   Patient Goals and CMS Choice   Choice offered to / list presented to : Patient  Discharge Placement     Existing PASRR number confirmed : 04/27/23          Patient [facility] bed at: [facility] Patient to be transferred to facility by: [facility] Name of family member notified: Forney [place] Patient and family notified of of transfer: 04/27/23  Discharge Plan and Services Additional resources added to the After Visit Summary for       Post Acute Care Choice:  (TBD)                               Social Determinants of Health (SDOH) Interventions SDOH Screenings   Food Insecurity: No Food Insecurity (04/26/2023)  Housing: Low Risk  (04/26/2023)  Transportation Needs: No Transportation Needs (04/26/2023)  Utilities: Not At Risk (04/26/2023)  Tobacco Use: Low Risk  (04/25/2023)     Readmission Risk Interventions    09/20/2022   10:22 AM  Readmission Risk Prevention Plan  Post Dischage Appt Complete  Medication Screening Complete  Transportation Screening Complete

## 2023-04-27 NOTE — Progress Notes (Signed)
Mobility Specialist - Progress Note   04/27/23 1235  Mobility  Activity Stood at bedside;Transferred from bed to chair  Level of Assistance Maximum assist, patient does 25-49%  Assistive Device Front wheel walker  Distance Ambulated (ft) 3 ft  Activity Response Tolerated fair  $Mobility charge 1 Mobility     Pt lying in bed upon arrival, utilizing RA. Pt agreeable to activity. Completed bed mobility with minA + use of bed rail. Mild post lean in sitting but doesn't require physical assist to hold self upright. Pt performed 2 STS with modA +2. On second STS pt progressed to taking 3 small steps towards recliner with maxA +2. Knee buckling and blocking on BLE with R > L. As pt fatigues, increased difficulty progressing LE forward. Pt assisted for controlled descend to seated position in chair with alarm set, needs in reach. RN notified.     Filiberto Pinks Mobility Specialist 04/27/23, 12:54 PM

## 2023-05-06 NOTE — Progress Notes (Deleted)
PCP: De Blanch, MD  Primary Cardiologist: Dorothyann Peng, MD (last seen 08/23; was a NS 10/23)  HPI:  Mr Hallenbeck is a 82 y/o male with a history of HTN, DM, dyslipidemia, CAD, depression, rheumatic fever, CVA, CKD, lymphedema, atrial fibrillation, gout and chronic heart failure.   Echo report from 11/22/21 reviewed and showed an EF of 60-65% along with moderate LVH/ LAE. Echo report from 03/03/18 reviewed and showed an EF of 60-65%.   Admitted 12/10/21 due to acute on chronic HF possibly caused by uncontrolled HTN. Initially given IV lasix with transition to oral diuretics. Elevated troponin thought to be due to demand ischemia. PT & hospice evaluations done.   Discharged after 3 days.   He presents today for a follow-up visit although hasn't been seen since January 2020. He presents with a chief complaint of moderate shortness of breath with little exertion. Describes this as chronic in nature having been present for years. He has associated fatigue, pedal edema and difficulty sleeping along with this. He denies any dizziness, chest pain, cough, palpitations or abdominal distention.   Says that he doesn't drink much fluids during the day because he'd "be in the bathroom all the time". Son says that nephrology told him to drink ~ 40 ounces of fluids daily. Not weighing daily.      ROS: All systems negative except as listed in HPI, PMH and Problem List.  SH:  Social History   Socioeconomic History   Marital status: Married    Spouse name: Not on file   Number of children: Not on file   Years of education: Not on file   Highest education level: Not on file  Occupational History   Not on file  Tobacco Use   Smoking status: Never   Smokeless tobacco: Never  Vaping Use   Vaping status: Never Used  Substance and Sexual Activity   Alcohol use: No   Drug use: No   Sexual activity: Never  Other Topics Concern   Not on file  Social History Narrative   Not on file   Social  Determinants of Health   Financial Resource Strain: Not on file  Food Insecurity: No Food Insecurity (04/26/2023)   Hunger Vital Sign    Worried About Running Out of Food in the Last Year: Never true    Ran Out of Food in the Last Year: Never true  Transportation Needs: No Transportation Needs (04/26/2023)   PRAPARE - Administrator, Civil Service (Medical): No    Lack of Transportation (Non-Medical): No  Physical Activity: Not on file  Stress: Not on file  Social Connections: Not on file  Intimate Partner Violence: Not At Risk (04/26/2023)   Humiliation, Afraid, Rape, and Kick questionnaire    Fear of Current or Ex-Partner: No    Emotionally Abused: No    Physically Abused: No    Sexually Abused: No    FH:  Family History  Problem Relation Age of Onset   Stroke Mother     Past Medical History:  Diagnosis Date   Acute embolic stroke (HCC) 11/26/2021   Chronic atrial fibrillation (HCC)    a. Afib dates back to at least 2002 when reviewing prior EKG with EKGs from 2014 onward showing Afib; b. CHADS2VASc at least 4 (CHF, HTN, age x 2)   Chronic diastolic CHF (congestive heart failure) (HCC)    a. TTE 5/19: EF 70-75%, mod LVH, near complete obliteration of the mid and apical LV cavity (can  be seen in apical hypertrophic CM), mildly dilated LA, RV cavity size nl, RV wall thickness nl, RVSF mildly reduced, mildly dilated RA; b. 02/2018 Limited echo w/ definity: EF 50-65%, no apical hypertrophic CM.   Community acquired bilateral lower lobe pneumonia 11/23/2021   Goals of care, counseling/discussion 12/11/2021   Gout    Hypertension    Hypertensive emergency 12/10/2021   Hypertensive urgency 07/16/2022    Current Outpatient Medications  Medication Sig Dispense Refill   acetaminophen (TYLENOL) 325 MG tablet Take 2 tablets (650 mg total) by mouth every 6 (six) hours as needed for mild pain (or Fever >/= 101).     allopurinol (ZYLOPRIM) 100 MG tablet Take 1 tablet (100 mg  total) by mouth daily. Home med.     apixaban (ELIQUIS) 2.5 MG TABS tablet Take 2.5 mg by mouth 2 (two) times daily.     cloNIDine (CATAPRES - DOSED IN MG/24 HR) 0.1 mg/24hr patch Place 0.1 mg onto the skin once a week.     finasteride (PROSCAR) 5 MG tablet Take 1 tablet (5 mg total) by mouth daily. 30 tablet 11   furosemide (LASIX) 40 MG tablet Take 40 mg by mouth daily.     hydrALAZINE (APRESOLINE) 100 MG tablet Take 1 tablet (100 mg total) by mouth 3 (three) times daily. 90 tablet 5   isosorbide mononitrate (IMDUR) 30 MG 24 hr tablet Take 30 mg by mouth 2 (two) times daily.     metoprolol succinate (TOPROL-XL) 100 MG 24 hr tablet Take 1 tablet (100 mg total) by mouth every evening. Take with or immediately following a meal.  Home med.     nystatin (MYCOSTATIN/NYSTOP) powder Apply 1 Application topically 2 (two) times daily.     rosuvastatin (CRESTOR) 20 MG tablet Take 1 tablet (20 mg total) by mouth daily. 30 tablet 0   traMADol (ULTRAM) 50 MG tablet Take 1 tablet (50 mg total) by mouth every 6 (six) hours as needed. 9 tablet 0   traZODone (DESYREL) 50 MG tablet Take 0.5 tablets (25 mg total) by mouth at bedtime as needed for sleep. 30 tablet 0   No current facility-administered medications for this visit.     PHYSICAL EXAM:  General:  Well appearing. No resp difficulty HEENT: normal Neck: supple. JVP flat. Carotids 2+ bilaterally; no bruits. No lymphadenopathy or thryomegaly appreciated. Cor: PMI normal. Regular rate & rhythm. No rubs, gallops or murmurs. Lungs: clear Abdomen: soft, nontender, nondistended. No hepatosplenomegaly. No bruits or masses. Good bowel sounds. Extremities: no cyanosis, clubbing, rash, edema Neuro: alert & orientedx3, cranial nerves grossly intact. Moves all 4 extremities w/o difficulty. Affect pleasant.   ECG:   ASSESSMENT & PLAN:  1: Chronic heart failure with preserved ejection fraction with structural changes (LVH)- - NYHA class III - euvolemic  today - not weighing daily but has scales; instructed to weigh daily and to call for an overnight weight gain of >2 pounds or a weekly weight gain of >5 pounds - not adding salt; Reminded to keep daily sodium intake to 2000mg  a day  - consider GDMT of entresto/ SGLT2 depending on how renal function does - drinking ~ 22 ounces of fluids daily; nephrology said needs to drink 40 ounces; explained the importance of needing to drink the 40 ounces so that his diuretic can work properly - BNP 12/10/21 was 1650.5  2: HTN with CKD- - BP elevated  - will increase his hydralazine to 100mg  TID; can finish his current bottle by taking 2  tablets TID - [facility] with PCP [facility]) and saw them last week - saw nephrology Cherylann Ratel) 12/10/21 - BMP 12/13/21 reviewed and showed sodium 138, potassium 4.4, creatinine 2.72 and GFR 23  3: Lymphedema- - stage 2 - son says that edema improves overnight after legs have been in the bed all night - patient says swelling has improved since recent admission  4: Atrial fibrillation- - saw cardiology Juliann Pares) 05/01/21 - advised to f/u with his office regarding possible cardioversion   Patient did not bring his medications nor a list. Each medication was verbally reviewed with the patient and he was encouraged to bring the bottles to every visit to confirm accuracy of list.   Return in 1 month, sooner if needed.

## 2023-05-07 ENCOUNTER — Telehealth: Payer: Self-pay | Admitting: Family

## 2023-05-07 ENCOUNTER — Encounter: Payer: Medicare (Managed Care) | Admitting: Family

## 2023-05-07 NOTE — Telephone Encounter (Signed)
Patient did not show for his [facility] Failure [facility] appointment on 05/07/23.

## 2023-06-12 ENCOUNTER — Encounter: Payer: Self-pay | Admitting: Emergency Medicine

## 2023-06-12 ENCOUNTER — Other Ambulatory Visit: Payer: Self-pay

## 2023-06-12 ENCOUNTER — Emergency Department: Payer: Medicare (Managed Care)

## 2023-06-12 ENCOUNTER — Inpatient Hospital Stay
Admission: EM | Admit: 2023-06-12 | Discharge: 2023-06-16 | DRG: 178 | Disposition: A | Discharge status: Skilled Nursing Facility | Payer: Medicare (Managed Care) | Source: Skilled Nursing Facility | Attending: Internal Medicine | Admitting: Internal Medicine

## 2023-06-12 DIAGNOSIS — Z79899 Other long term (current) drug therapy: Secondary | ICD-10-CM | POA: Diagnosis not present

## 2023-06-12 DIAGNOSIS — I482 Chronic atrial fibrillation, unspecified: Secondary | ICD-10-CM | POA: Diagnosis present

## 2023-06-12 DIAGNOSIS — M109 Gout, unspecified: Secondary | ICD-10-CM | POA: Diagnosis present

## 2023-06-12 DIAGNOSIS — U071 COVID-19: Secondary | ICD-10-CM | POA: Diagnosis not present

## 2023-06-12 DIAGNOSIS — E860 Dehydration: Secondary | ICD-10-CM | POA: Diagnosis present

## 2023-06-12 DIAGNOSIS — E1165 Type 2 diabetes mellitus with hyperglycemia: Secondary | ICD-10-CM | POA: Diagnosis present

## 2023-06-12 DIAGNOSIS — E876 Hypokalemia: Secondary | ICD-10-CM | POA: Diagnosis present

## 2023-06-12 DIAGNOSIS — I5032 Chronic diastolic (congestive) heart failure: Secondary | ICD-10-CM | POA: Diagnosis present

## 2023-06-12 DIAGNOSIS — E1122 Type 2 diabetes mellitus with diabetic chronic kidney disease: Secondary | ICD-10-CM | POA: Diagnosis present

## 2023-06-12 DIAGNOSIS — Z888 Allergy status to other drugs, medicaments and biological substances status: Secondary | ICD-10-CM

## 2023-06-12 DIAGNOSIS — Z7901 Long term (current) use of anticoagulants: Secondary | ICD-10-CM | POA: Diagnosis not present

## 2023-06-12 DIAGNOSIS — E861 Hypovolemia: Secondary | ICD-10-CM | POA: Diagnosis present

## 2023-06-12 DIAGNOSIS — Z66 Do not resuscitate: Secondary | ICD-10-CM | POA: Diagnosis present

## 2023-06-12 DIAGNOSIS — Z8673 Personal history of transient ischemic attack (TIA), and cerebral infarction without residual deficits: Secondary | ICD-10-CM | POA: Diagnosis not present

## 2023-06-12 DIAGNOSIS — N281 Cyst of kidney, acquired: Secondary | ICD-10-CM | POA: Diagnosis present

## 2023-06-12 DIAGNOSIS — R531 Weakness: Secondary | ICD-10-CM | POA: Diagnosis not present

## 2023-06-12 DIAGNOSIS — K5900 Constipation, unspecified: Secondary | ICD-10-CM | POA: Diagnosis present

## 2023-06-12 DIAGNOSIS — N179 Acute kidney failure, unspecified: Secondary | ICD-10-CM | POA: Diagnosis present

## 2023-06-12 DIAGNOSIS — I130 Hypertensive heart and chronic kidney disease with heart failure and stage 1 through stage 4 chronic kidney disease, or unspecified chronic kidney disease: Secondary | ICD-10-CM | POA: Diagnosis present

## 2023-06-12 DIAGNOSIS — N184 Chronic kidney disease, stage 4 (severe): Secondary | ICD-10-CM | POA: Diagnosis present

## 2023-06-12 DIAGNOSIS — Z9049 Acquired absence of other specified parts of digestive tract: Secondary | ICD-10-CM

## 2023-06-12 DIAGNOSIS — N400 Enlarged prostate without lower urinary tract symptoms: Secondary | ICD-10-CM | POA: Diagnosis present

## 2023-06-12 DIAGNOSIS — Z823 Family history of stroke: Secondary | ICD-10-CM

## 2023-06-12 DIAGNOSIS — E785 Hyperlipidemia, unspecified: Secondary | ICD-10-CM | POA: Diagnosis present

## 2023-06-12 DIAGNOSIS — E871 Hypo-osmolality and hyponatremia: Secondary | ICD-10-CM | POA: Diagnosis present

## 2023-06-12 DIAGNOSIS — E1169 Type 2 diabetes mellitus with other specified complication: Secondary | ICD-10-CM

## 2023-06-12 LAB — CBC WITH DIFFERENTIAL/PLATELET
Abs Immature Granulocytes: 0.01 10*3/uL (ref 0.00–0.07)
Basophils Absolute: 0 10*3/uL (ref 0.0–0.1)
Basophils Relative: 0 %
Eosinophils Absolute: 0.1 10*3/uL (ref 0.0–0.5)
Eosinophils Relative: 2 %
HCT: 44.5 % (ref 39.0–52.0)
Hemoglobin: 14.4 g/dL (ref 13.0–17.0)
Immature Granulocytes: 0 %
Lymphocytes Relative: 23 %
Lymphs Abs: 1.2 10*3/uL (ref 0.7–4.0)
MCH: 29.8 pg (ref 26.0–34.0)
MCHC: 32.4 g/dL (ref 30.0–36.0)
MCV: 92.1 fL (ref 80.0–100.0)
Monocytes Absolute: 0.7 10*3/uL (ref 0.1–1.0)
Monocytes Relative: 14 %
Neutro Abs: 3.3 10*3/uL (ref 1.7–7.7)
Neutrophils Relative %: 61 %
Platelets: 145 10*3/uL — ABNORMAL LOW (ref 150–400)
RBC: 4.83 MIL/uL (ref 4.22–5.81)
RDW: 14 % (ref 11.5–15.5)
WBC: 5.4 10*3/uL (ref 4.0–10.5)
nRBC: 0 % (ref 0.0–0.2)

## 2023-06-12 LAB — BASIC METABOLIC PANEL
Anion gap: 15 (ref 5–15)
BUN: 95 mg/dL — ABNORMAL HIGH (ref 8–23)
CO2: 27 mmol/L (ref 22–32)
Calcium: 9.1 mg/dL (ref 8.9–10.3)
Chloride: 90 mmol/L — ABNORMAL LOW (ref 98–111)
Creatinine, Ser: 2.92 mg/dL — ABNORMAL HIGH (ref 0.61–1.24)
GFR, Estimated: 21 mL/min — ABNORMAL LOW (ref >60)
Glucose, Bld: 144 mg/dL — ABNORMAL HIGH (ref 70–99)
Potassium: 3.1 mmol/L — ABNORMAL LOW (ref 3.5–5.1)
Sodium: 132 mmol/L — ABNORMAL LOW (ref 135–145)

## 2023-06-12 LAB — LACTATE DEHYDROGENASE: LDH: 187 U/L (ref 98–192)

## 2023-06-12 LAB — HEMOGLOBIN A1C
Hgb A1c MFr Bld: 6.9 % — ABNORMAL HIGH (ref 4.8–5.6)
Mean Plasma Glucose: 151.33 mg/dL

## 2023-06-12 LAB — C-REACTIVE PROTEIN: CRP: 6.7 mg/dL — ABNORMAL HIGH (ref <1.0)

## 2023-06-12 LAB — POTASSIUM: Potassium: 3.2 mmol/L — ABNORMAL LOW (ref 3.5–5.1)

## 2023-06-12 LAB — PROCALCITONIN: Procalcitonin: 0.34 ng/mL

## 2023-06-12 LAB — TROPONIN I (HIGH SENSITIVITY)
Troponin I (High Sensitivity): 41 ng/L — ABNORMAL HIGH (ref <18)
Troponin I (High Sensitivity): 43 ng/L — ABNORMAL HIGH (ref <18)

## 2023-06-12 LAB — D-DIMER, QUANTITATIVE: D-Dimer, Quant: 0.54 ug{FEU}/mL — ABNORMAL HIGH (ref 0.00–0.50)

## 2023-06-12 LAB — MAGNESIUM: Magnesium: 3.1 mg/dL — ABNORMAL HIGH (ref 1.7–2.4)

## 2023-06-12 LAB — MRSA NEXT GEN BY PCR, NASAL: MRSA by PCR Next Gen: DETECTED — AB

## 2023-06-12 LAB — SARS CORONAVIRUS 2 BY RT PCR: SARS Coronavirus 2 by RT PCR: POSITIVE — AB

## 2023-06-12 MED ORDER — MUPIROCIN 2 % EX OINT
1.0000 | TOPICAL_OINTMENT | Freq: Two times a day (BID) | CUTANEOUS | Status: DC
  Administered 2023-06-12 – 2023-06-15 (×7): 1 via NASAL
  Filled 2023-06-12: qty 22

## 2023-06-12 MED ORDER — ACETAMINOPHEN 325 MG PO TABS
650.0000 mg | ORAL_TABLET | Freq: Four times a day (QID) | ORAL | Status: DC | PRN
  Filled 2023-06-12: qty 2

## 2023-06-12 MED ORDER — CHLORHEXIDINE GLUCONATE CLOTH 2 % EX PADS
6.0000 | MEDICATED_PAD | Freq: Every day | CUTANEOUS | Status: DC
  Administered 2023-06-13 – 2023-06-16 (×4): 6 via TOPICAL

## 2023-06-12 MED ORDER — POTASSIUM CHLORIDE 10 MEQ/100ML IV SOLN
10.0000 meq | INTRAVENOUS | Status: AC
  Administered 2023-06-13 (×2): 10 meq via INTRAVENOUS
  Filled 2023-06-12: qty 100

## 2023-06-12 MED ORDER — ACETAMINOPHEN 650 MG RE SUPP: 650.0000 mg | Freq: Four times a day (QID) | RECTAL | Status: DC | PRN

## 2023-06-12 MED ORDER — SODIUM CHLORIDE 0.9 % IV SOLN: INTRAVENOUS | Status: AC

## 2023-06-12 MED ORDER — ALBUTEROL SULFATE (2.5 MG/3ML) 0.083% IN NEBU: 2.5000 mg | INHALATION_SOLUTION | RESPIRATORY_TRACT | Status: DC | PRN

## 2023-06-12 MED ORDER — POTASSIUM CHLORIDE 20 MEQ PO PACK
40.0000 meq | PACK | ORAL | Status: AC
  Administered 2023-06-12: 40 meq via ORAL
  Filled 2023-06-12: qty 2

## 2023-06-12 MED ORDER — ONDANSETRON HCL 4 MG PO TABS: 4.0000 mg | ORAL_TABLET | Freq: Four times a day (QID) | ORAL | Status: DC | PRN

## 2023-06-12 MED ORDER — ONDANSETRON HCL 4 MG/2ML IJ SOLN: 4.0000 mg | Freq: Four times a day (QID) | INTRAMUSCULAR | Status: DC | PRN

## 2023-06-12 MED ORDER — POTASSIUM CHLORIDE 10 MEQ/100ML IV SOLN
10.0000 meq | INTRAVENOUS | Status: DC
  Administered 2023-06-12 (×2): 10 meq via INTRAVENOUS
  Filled 2023-06-12: qty 100

## 2023-06-12 MED ORDER — SODIUM CHLORIDE 0.9 % IV BOLUS
500.0000 mL | Freq: Once | INTRAVENOUS | Status: AC
  Administered 2023-06-12: 500 mL via INTRAVENOUS

## 2023-06-12 NOTE — ED Triage Notes (Signed)
Pt arrives via EMS from [facility] with reports of positive covid, paperwork shows negative covid test on 8/12. Pt denies covid but reports he was told he has PNA. Denies any pain.

## 2023-06-12 NOTE — Plan of Care (Signed)
  Problem: Clinical Measurements: Goal: Ability to maintain clinical measurements within normal limits will improve Outcome: Progressing   Problem: Safety: Goal: Ability to remain free from injury will improve Outcome: Progressing   Problem: Skin Integrity: Goal: Risk for impaired skin integrity will decrease Outcome: Progressing   Problem: Coping: Goal: Psychosocial and spiritual needs will be supported Outcome: Progressing

## 2023-06-12 NOTE — ED Provider Notes (Signed)
[facility] Provider Note    Event Date/Time   First MD Initiated Contact with Patient 06/12/23 1112     (approximate)   History   Chief Complaint: Pneumonia   HPI  George Bolton is a 82 y.o. male with a history of hypertension gout CHF who is sent to the ED from [facility] due to generalized weakness worsening for the past few days.  Reportedly the patient's roommate recently had COVID.  Per nursing home documentation, patient tested positive on 8/14.  Patient reports loss of appetite and no oral intake over the last few days.  Denies shortness of breath but endorses cough     Physical Exam   Triage Vital Signs: ED Triage Vitals  Encounter Vitals Group     BP 06/12/23 1102 132/87     Systolic BP Percentile --      Diastolic BP Percentile --      Pulse Rate 06/12/23 1102 82     Resp 06/12/23 1102 14     Temp 06/12/23 1102 98 F (36.7 C)     Temp Source 06/12/23 1102 Oral     SpO2 06/12/23 1102 97 %     Weight 06/12/23 1103 180 lb 12.4 oz (82 kg)     Height 06/12/23 1103 6\' 1"  (1.854 m)     Head Circumference --      Peak Flow --      Pain Score 06/12/23 1102 0     Pain Loc --      Pain Education --      Exclude from Growth Chart --     Most recent vital signs: Vitals:   06/12/23 1102 06/12/23 1209  BP: 132/87 133/81  Pulse: 82 76  Resp: 14 (!) 22  Temp: 98 F (36.7 C)   SpO2: 97% 99%    General: Awake, no distress.  CV:  Good peripheral perfusion.  Regular rate and rhythm Resp:  Normal effort.  Clear to auscultation bilateral Abd:  No distention.  Soft nontender Other:  Dry oral mucosa.   ED Results / Procedures / Treatments   Labs (all labs ordered are listed, but only abnormal results are displayed) Labs Reviewed  SARS CORONAVIRUS 2 BY RT PCR - Abnormal; Notable for the following components:      Result Value   SARS Coronavirus 2 by RT PCR POSITIVE (*)    All other components within normal limits  BASIC METABOLIC  PANEL - Abnormal; Notable for the following components:   Sodium 132 (*)    Potassium 3.1 (*)    Chloride 90 (*)    Glucose, Bld 144 (*)    BUN 95 (*)    Creatinine, Ser 2.92 (*)    GFR, Estimated 21 (*)    All other components within normal limits  CBC WITH DIFFERENTIAL/PLATELET - Abnormal; Notable for the following components:   Platelets 145 (*)    All other components within normal limits     EKG Interpreted by me Atrial fibrillation, rate of 91.  Normal axis and intervals.  Normal QRS ST segments and T waves.   RADIOLOGY Chest x-ray interpreted by me, negative for effusion edema or lobar consolidation.  Radiology report reviewed   PROCEDURES:  Procedures   MEDICATIONS ORDERED IN ED: Medications  sodium chloride 0.9 % bolus 500 mL (500 mLs Intravenous New Bag/Given 06/12/23 1138)     IMPRESSION / MDM / ASSESSMENT AND PLAN / ED COURSE  I reviewed  the triage vital signs and the nursing notes.  DDx: COVID-19 infection, pneumonia, AKI, electrolyte abnormality, anemia  Patient's presentation is most consistent with acute presentation with potential threat to life or bodily function.  Patient presents with generalized weakness, poor oral intake.  Appears dehydrated.  Vital signs unremarkable.  Labs show AKI, hypokalemia, COVID-positive.  Patient given IV fluids, will supplement potassium.  Case discussed with hospitalist for further management.       FINAL CLINICAL IMPRESSION(S) / ED DIAGNOSES   Final diagnoses:  COVID-19 virus infection  AKI (acute kidney injury) (HCC)  Generalized weakness     Rx / DC Orders   ED Discharge Orders     None        Note:  This document was prepared using Dragon voice recognition software and may include unintentional dictation errors.   Sharman Cheek, MD 06/12/23 1257

## 2023-06-12 NOTE — ED Notes (Signed)
Hospitalist at bedside 

## 2023-06-12 NOTE — H&P (Addendum)
History and Physical    George Bolton ZOX:[id number] DOB: 1941/09/18 DOA: 06/12/2023  PCP: Unice Bailey, MD  Patient coming from: [facility]  I have personally briefly reviewed patient's old medical records in [facility] Link  Chief Complaint: poor intake weakness insetting of covid dx  HPI: George Bolton is a 82 y.o. male with medical history significant of  diastolic CHF, chronic atrial fibrillation on Eliquis, gout, stage IV CKD, hypertension and history of embolic stroke, who resides in NH and has interim history of recent dx of covid 8/14 after sick contact.  Per staff patient has had poor po intake over the last few days and has gotten progressively weak. Patient notes no fever no chills no sob but has developed a cough over the last few days. He also notes generalized malaise, loss of appetite and decrease oral intake.   ED Course:  Vitals : 132/97, hr 82, rr 14  sat 97%  Labs   Wbc 5.4, hgb 14.4,  plt 145 Na 132, K 3.1, cl 90, glu 144, cr 2.92 ( 1.85) Respiratory panel + covid EKG: Afib nonspecific t wave changes   Cxr IMPRESSION: No evidence of acute cardiopulmonary disease.   Tx 500cc ns Review of Systems: As per HPI otherwise 10 point review of systems negative.   Past Medical History:  Diagnosis Date   Acute embolic stroke (HCC) 11/26/2021   Chronic atrial fibrillation (HCC)    a. Afib dates back to at least 2002 when reviewing prior EKG with EKGs from 2014 onward showing Afib; b. CHADS2VASc at least 4 (CHF, HTN, age x 2)   Chronic diastolic CHF (congestive heart failure) (HCC)    a. TTE 5/19: EF 70-75%, mod LVH, near complete obliteration of the mid and apical LV cavity (can be seen in apical hypertrophic CM), mildly dilated LA, RV cavity size nl, RV wall thickness nl, RVSF mildly reduced, mildly dilated RA; b. 02/2018 Limited echo w/ definity: EF 50-65%, no apical hypertrophic CM.   Community acquired bilateral lower lobe pneumonia 11/23/2021   Goals of  care, counseling/discussion 12/11/2021   Gout    Hypertension    Hypertensive emergency 12/10/2021   Hypertensive urgency 07/16/2022    Past Surgical History:  Procedure Laterality Date   CHOLECYSTECTOMY N/A 01/13/2017   Procedure: LAPAROSCOPIC CHOLECYSTECTOMY;  Surgeon: Leafy Ro, MD;  Location: [facility] ORS;  Service: General;  Laterality: N/A;     reports that he has never smoked. He has never used smokeless tobacco. He reports that he does not drink alcohol and does not use drugs.  Allergies  Allergen Reactions   Cortisone Anaphylaxis   Triamcinolone     Other reaction(s): Unknown    Family History  Problem Relation Age of Onset   Stroke Mother     Prior to Admission medications   Medication Sig Start Date End Date Taking? Authorizing Provider  acetaminophen (TYLENOL) 325 MG tablet Take 2 tablets (650 mg total) by mouth every 6 (six) hours as needed for mild pain (or Fever >/= 101). 11/28/21   Alford Highland, MD  allopurinol (ZYLOPRIM) 100 MG tablet Take 1 tablet (100 mg total) by mouth daily. Home med. 09/20/22   Darlin Priestly, MD  apixaban (ELIQUIS) 2.5 MG TABS tablet Take 2.5 mg by mouth 2 (two) times daily. 12/27/19   [provider]  cloNIDine (CATAPRES - DOSED IN MG/24 HR) 0.1 mg/24hr patch Place 0.1 mg onto the skin once a week.    [provider]  finasteride (PROSCAR) 5 MG tablet Take 1 tablet (5 mg total) by mouth daily. 05/29/22   Vaillancourt, Lelon Mast, PA-C  furosemide (LASIX) 40 MG tablet Take 40 mg by mouth daily.    [provider]  hydrALAZINE (APRESOLINE) 100 MG tablet Take 1 tablet (100 mg total) by mouth 3 (three) times daily. 04/02/22   Delma Freeze, FNP  isosorbide mononitrate (IMDUR) 30 MG 24 hr tablet Take 30 mg by mouth 2 (two) times daily. 01/31/21   [provider]  metoprolol succinate (TOPROL-XL) 100 MG 24 hr tablet Take 1 tablet (100 mg total) by mouth every evening. Take with or immediately following a meal.  Home med.  09/20/22   Darlin Priestly, MD  nystatin (MYCOSTATIN/NYSTOP) powder Apply 1 Application topically 2 (two) times daily. 05/22/22   [provider]  rosuvastatin (CRESTOR) 20 MG tablet Take 1 tablet (20 mg total) by mouth daily. 11/28/21   Alford Highland, MD  traMADol (ULTRAM) 50 MG tablet Take 1 tablet (50 mg total) by mouth every 6 (six) hours as needed. 04/27/23   Loyce Dys, MD  traZODone (DESYREL) 50 MG tablet Take 0.5 tablets (25 mg total) by mouth at bedtime as needed for sleep. 04/27/23   Loyce Dys, MD    Physical Exam: Vitals:   06/12/23 1102 06/12/23 1103 06/12/23 1209  BP: 132/87  133/81  Pulse: 82  76  Resp: 14  (!) 22  Temp: 98 F (36.7 C)    TempSrc: Oral    SpO2: 97%  99%  Weight:  82 kg   Height:  6\' 1"  (1.854 m)     Constitutional: NAD, calm, comfortable Vitals:   06/12/23 1102 06/12/23 1103 06/12/23 1209  BP: 132/87  133/81  Pulse: 82  76  Resp: 14  (!) 22  Temp: 98 F (36.7 C)    TempSrc: Oral    SpO2: 97%  99%  Weight:  82 kg   Height:  6\' 1"  (1.854 m)    Eyes: PERRL, lids and conjunctivae normal ENMT: Mucous membranes are moist. Posterior pharynx clear of any exudate or lesions.Normal dentition.  Neck: normal, supple, no masses, no thyromegaly Respiratory: clear to auscultation bilaterally, no wheezing, no crackles. Normal respiratory effort. No accessory muscle use.  Cardiovascular: Regular rate and rhythm, no murmurs / rubs / gallops. No extremity edema. 2+ pedal pulses.  Abdomen: no tenderness, no masses palpated. No hepatosplenomegaly. Bowel sounds positive.  Musculoskeletal: no clubbing / cyanosis. No joint deformity upper and lower extremities. Good ROM, no contractures. Normal muscle tone.  Skin: no rashes, lesions, ulcers. No induration Neurologic: CN 2-12 grossly intact. Sensation intact, Strength 5/5 in all 4.  Psychiatric: Normal judgment and insight. Alert and oriented x 3. Normal mood.    Labs on Admission: I have personally  reviewed following labs and imaging studies  CBC: Recent Labs  Lab 06/12/23 1131  WBC 5.4  NEUTROABS 3.3  HGB 14.4  HCT 44.5  MCV 92.1  PLT 145*   Basic Metabolic Panel: Recent Labs  Lab 06/12/23 1131  NA 132*  K 3.1*  CL 90*  CO2 27  GLUCOSE 144*  BUN 95*  CREATININE 2.92*  CALCIUM 9.1   GFR: Estimated Creatinine Clearance: 21.3 mL/min (A) (by C-G formula based on SCr of 2.92 mg/dL (H)). Liver Function Tests: No results for input(s): "AST", "ALT", "ALKPHOS", "BILITOT", "PROT", "ALBUMIN" in the last 168 hours. No results for input(s): "LIPASE", "AMYLASE" in the last 168 hours. No  results for input(s): "AMMONIA" in the last 168 hours. Coagulation Profile: No results for input(s): "INR", "PROTIME" in the last 168 hours. Cardiac Enzymes: No results for input(s): "CKTOTAL", "CKMB", "CKMBINDEX", "TROPONINI" in the last 168 hours. BNP (last 3 results) No results for input(s): "PROBNP" in the last 8760 hours. HbA1C: No results for input(s): "HGBA1C" in the last 72 hours. CBG: No results for input(s): "GLUCAP" in the last 168 hours. Lipid Profile: No results for input(s): "CHOL", "HDL", "LDLCALC", "TRIG", "CHOLHDL", "LDLDIRECT" in the last 72 hours. Thyroid Function Tests: No results for input(s): "TSH", "T4TOTAL", "FREET4", "T3FREE", "THYROIDAB" in the last 72 hours. Anemia Panel: No results for input(s): "VITAMINB12", "FOLATE", "FERRITIN", "TIBC", "IRON", "RETICCTPCT" in the last 72 hours. Urine analysis:    Component Value Date/Time   COLORURINE YELLOW (A) 04/25/2023 2126   APPEARANCEUR CLEAR (A) 04/25/2023 2126   APPEARANCEUR Clear 07/21/2018 0959   LABSPEC 1.011 04/25/2023 2126   LABSPEC 1.014 09/23/2012 1438   PHURINE 6.0 04/25/2023 2126   GLUCOSEU NEGATIVE 04/25/2023 2126   GLUCOSEU 50 mg/dL 78/29/5621 3086   HGBUR SMALL (A) 04/25/2023 2126   BILIRUBINUR NEGATIVE 04/25/2023 2126   BILIRUBINUR Negative 07/21/2018 0959   BILIRUBINUR Negative 09/23/2012 1438    KETONESUR NEGATIVE 04/25/2023 2126   PROTEINUR 100 (A) 04/25/2023 2126   NITRITE NEGATIVE 04/25/2023 2126   LEUKOCYTESUR LARGE (A) 04/25/2023 2126   LEUKOCYTESUR Negative 09/23/2012 1438    Radiological Exams on Admission: DG Chest 2 View  Result Date: 06/12/2023 CLINICAL DATA:  Cough.  COVID positive. EXAM: CHEST - 2 VIEW COMPARISON:  Chest radiographs 04/25/2023. FINDINGS: No consolidation or pulmonary edema. Stable cardiac and mediastinal contours. No pleural effusion or pneumothorax. Visualized bones and upper abdomen are unremarkable. IMPRESSION: No evidence of acute cardiopulmonary disease. Electronically Signed   By: Orvan Falconer M.D.   On: 06/12/2023 11:44    EKG: Independently reviewed. See above   Assessment/Plan  COVID viral infection with associated AKI on CKDIV -admit to med tele  - of note patient has no significant pulmonary symptoms  - will continue to monitor respiratory status  - AKI due to low appetite poor po intake  - s/p 500cc ns in ED - will give gentle iv 50cc x 8 hours , and encourage oral intake  - further ivfs based on repeat labs in am  -hold nephrotoxic medications -covid labs per protocol  -strict I/o  - bladder scan ensure no retention to be complete  -further evaluation /consult base on morning labs /clinical picture   Hypokalemia -replete  -monitor labs   Hypovolemic hyponatremia  - gently ivfs  -monitor labs   Generalized Weakness -due to acute illness -PT/OT ordered   Diastolic CHF -no acute exacerbation   -holding diuretic currently due to aki -continue on imdur/hydralazine/metoprolol    Atrial fibrillation  -rate controlled  -continue on metoprolol/ Eliquis   Gout - no current flare  -resume home regimen   Hypertension  -continue current home regimen   Hx of Embolic CVA -on Eliquis for ppx     DVT prophylaxis: on Eliquis Code Status: DNR/ as discussed per patient wishes in event of cardiac arrest  Family  Communication: none at bedside Disposition Plan: patient  expected to be admitted greater than 2 midnights  Consults called: n/a Admission status: med [place] See   Lurline Del MD [facility]   If 7PM-7AM, please contact night-coverage [email or web address] Password [facility]  06/12/2023, 12:54 PM

## 2023-06-12 NOTE — ED Notes (Signed)
Patient transported to X-ray 

## 2023-06-13 ENCOUNTER — Inpatient Hospital Stay: Payer: Medicare (Managed Care)

## 2023-06-13 DIAGNOSIS — U071 COVID-19: Secondary | ICD-10-CM | POA: Diagnosis not present

## 2023-06-13 LAB — COMPREHENSIVE METABOLIC PANEL
ALT: 24 U/L (ref 0–44)
AST: 32 U/L (ref 15–41)
Albumin: 3.3 g/dL — ABNORMAL LOW (ref 3.5–5.0)
Alkaline Phosphatase: 50 U/L (ref 38–126)
Anion gap: 9 (ref 5–15)
BUN: 88 mg/dL — ABNORMAL HIGH (ref 8–23)
CO2: 25 mmol/L (ref 22–32)
Calcium: 8.7 mg/dL — ABNORMAL LOW (ref 8.9–10.3)
Chloride: 100 mmol/L (ref 98–111)
Creatinine, Ser: 2.54 mg/dL — ABNORMAL HIGH (ref 0.61–1.24)
GFR, Estimated: 24 mL/min — ABNORMAL LOW (ref >60)
Glucose, Bld: 138 mg/dL — ABNORMAL HIGH (ref 70–99)
Potassium: 3.7 mmol/L (ref 3.5–5.1)
Sodium: 134 mmol/L — ABNORMAL LOW (ref 135–145)
Total Bilirubin: 0.7 mg/dL (ref 0.3–1.2)
Total Protein: 6.5 g/dL (ref 6.5–8.1)

## 2023-06-13 LAB — PHOSPHORUS: Phosphorus: 3.3 mg/dL (ref 2.5–4.6)

## 2023-06-13 LAB — CBC
HCT: 38.5 % — ABNORMAL LOW (ref 39.0–52.0)
Hemoglobin: 12.9 g/dL — ABNORMAL LOW (ref 13.0–17.0)
MCH: 30.1 pg (ref 26.0–34.0)
MCHC: 33.5 g/dL (ref 30.0–36.0)
MCV: 89.7 fL (ref 80.0–100.0)
Platelets: 143 10*3/uL — ABNORMAL LOW (ref 150–400)
RBC: 4.29 MIL/uL (ref 4.22–5.81)
RDW: 14 % (ref 11.5–15.5)
WBC: 5.4 10*3/uL (ref 4.0–10.5)
nRBC: 0 % (ref 0.0–0.2)

## 2023-06-13 MED ORDER — SODIUM CHLORIDE 0.9 % IV SOLN: INTRAVENOUS | Status: DC

## 2023-06-13 MED ORDER — HYDRALAZINE HCL 50 MG PO TABS: 50.0000 mg | ORAL_TABLET | Freq: Four times a day (QID) | ORAL | Status: DC | PRN

## 2023-06-13 MED ORDER — TRAMADOL HCL 50 MG PO TABS
50.0000 mg | ORAL_TABLET | Freq: Four times a day (QID) | ORAL | Status: DC | PRN
  Administered 2023-06-13 – 2023-06-16 (×3): 50 mg via ORAL
  Filled 2023-06-13 (×3): qty 1

## 2023-06-13 MED ORDER — HYDRALAZINE HCL 20 MG/ML IJ SOLN: 10.0000 mg | Freq: Four times a day (QID) | INTRAMUSCULAR | Status: DC | PRN

## 2023-06-13 MED ORDER — TAMSULOSIN HCL 0.4 MG PO CAPS
0.4000 mg | ORAL_CAPSULE | Freq: Every day | ORAL | Status: DC
  Administered 2023-06-13 – 2023-06-15 (×3): 0.4 mg via ORAL
  Filled 2023-06-13 (×3): qty 1

## 2023-06-13 MED ORDER — ALLOPURINOL 100 MG PO TABS
100.0000 mg | ORAL_TABLET | Freq: Every day | ORAL | Status: DC
  Administered 2023-06-13 – 2023-06-15 (×3): 100 mg via ORAL
  Filled 2023-06-13 (×4): qty 1

## 2023-06-13 MED ORDER — ROSUVASTATIN CALCIUM 10 MG PO TABS
20.0000 mg | ORAL_TABLET | Freq: Every day | ORAL | Status: DC
  Administered 2023-06-13 – 2023-06-15 (×3): 20 mg via ORAL
  Filled 2023-06-13 (×4): qty 2

## 2023-06-13 MED ORDER — FINASTERIDE 5 MG PO TABS
5.0000 mg | ORAL_TABLET | Freq: Every day | ORAL | Status: DC
  Administered 2023-06-13 – 2023-06-15 (×3): 5 mg via ORAL
  Filled 2023-06-13 (×4): qty 1

## 2023-06-13 MED ORDER — TRAZODONE HCL 50 MG PO TABS: 25.0000 mg | ORAL_TABLET | Freq: Every evening | ORAL | Status: DC | PRN

## 2023-06-13 MED ORDER — APIXABAN 2.5 MG PO TABS
2.5000 mg | ORAL_TABLET | Freq: Two times a day (BID) | ORAL | Status: DC
  Administered 2023-06-13 – 2023-06-15 (×6): 2.5 mg via ORAL
  Filled 2023-06-13 (×7): qty 1

## 2023-06-13 NOTE — Evaluation (Signed)
Physical Therapy Evaluation Patient Details Name: George Bolton MRN: [id number] DOB: 1941/10/26 Today's Date: 06/13/2023  History of Present Illness  George Bolton is a 82 y.o. male with medical history significant of   diastolic CHF, chronic atrial fibrillation on Eliquis, gout, stage IV CKD, hypertension and history of embolic stroke, who resides in NH and has interim history of recent dx of covid 8/14 after sick contact.  Per staff patient has had poor po intake over the last few days and has gotten progressively weak. Patient notes no fever no chills no sob but has developed a cough over the last few days. He also notes generalized malaise, loss of appetite and decrease oral intake.   Clinical Impression  Pt alert, oriented to self only. Did express frustration with "so many questions" unclear if pt unwilling/unable to answer PLOF. Per chart review pt was recently at [facility] after a tibial plateau fracture (per last admission WBAT).   He ultimately required 2 person assist for all mobility tasks. Sit <> stand attempted with RW and two person handheld assist. modAx2 with RW but unable to step or pivot to recliner, and maxAx2 for handheld assist but questionable effort/pt fearful of falling.  Overall the patient demonstrated deficits (see "PT Problem List") that impede the patient's functional abilities, safety, and mobility and would benefit from skilled PT intervention.         If plan is discharge home, recommend the following: Two people to help with walking and/or transfers;Two people to help with bathing/dressing/bathroom;Direct supervision/assist for medications management;Help with stairs or ramp for entrance;Assist for transportation;Assistance with feeding;Assistance with cooking/housework;Supervision due to cognitive status   Can travel by private vehicle   No    Equipment Recommendations Other (comment) (TBD at next venue of care)  Recommendations for Other Services        Functional Status Assessment Patient has had a recent decline in their functional status and demonstrates the ability to make significant improvements in function in a reasonable and predictable amount of time.     Precautions / Restrictions Precautions Precautions: Fall Restrictions Weight Bearing Restrictions: Yes RLE Weight Bearing: Weight bearing as tolerated Other Position/Activity Restrictions: per chart pt with tibial plateau fx in 01/2023      Mobility  Bed Mobility Overal bed mobility: Needs Assistance Bed Mobility: Supine to Sit, Sit to Supine     Supine to sit: Mod assist, +2 for physical assistance Sit to supine: +2 for physical assistance, Min assist   General bed mobility comments: extended time but pt does ultimately require assistance for all bed mobility    Transfers Overall transfer level: Needs assistance Equipment used: Rolling walker (2 wheels), 2 person hand held assist Transfers: Sit to/from Stand Sit to Stand: Max assist, Mod assist, +2 physical assistance           General transfer comment: modA x2 to stand with RW, but unable to take a step. two person sit <> stand maxAx2 attempted but unable to come up fully into standing or pivot safely    Ambulation/Gait               General Gait Details: unable  Stairs            Wheelchair Mobility     Tilt Bed    Modified Rankin (Stroke Patients Only)       Balance Overall balance assessment: Needs assistance Sitting-balance support: Feet supported Sitting balance-Leahy Scale: Fair       Standing balance-Leahy  Scale: Zero                               Pertinent Vitals/Pain Pain Assessment Pain Assessment: No/denies pain    Home Living Family/patient expects to be discharged to:: Skilled nursing facility                   Additional Comments: Per medical record, pt is a participate in the [facility] program. However, has been at [facility] since soon after  R tibial plateau fx (02/14/23).    Prior Function Prior Level of Function : Needs assist;Patient poor historian/Family not available             Mobility Comments: Per medical record, pt is WBAT in RLE and has not been able to mobilize at [facility]. Despite being A+O, pt unable or unwilling to provide information about prior level. ADLs Comments: Unknown     Extremity/Trunk Assessment   Upper Extremity Assessment Upper Extremity Assessment: Defer to OT evaluation    Lower Extremity Assessment Lower Extremity Assessment: Generalized weakness (able to lift BLE against gravity)       Communication   Communication Communication: No apparent difficulties  Cognition Arousal: Alert Behavior During Therapy: Flat affect, Lability                                   General Comments: pt does joke to himself, oriented to self only, expressed frustration about being asked so many questions        General Comments General comments (skin integrity, edema, etc.): Pt on room air throughout session.    Exercises     Assessment/Plan    PT Assessment Patient needs continued PT services  PT Problem List Decreased strength;Decreased activity tolerance;Decreased balance;Decreased mobility;Decreased knowledge of use of DME;Decreased knowledge of precautions       PT Treatment Interventions DME instruction;Neuromuscular re-education;Gait training;Stair training;Patient/family education;Functional mobility training;Therapeutic activities;Therapeutic exercise;Balance training    PT Goals (Current goals can be found in the Care Plan section)  Acute Rehab PT Goals PT Goal Formulation: Patient unable to participate in goal setting Time For Goal Achievement: 06/27/23 Potential to Achieve Goals: Fair    Frequency Min 1X/week     Co-evaluation               AM-PAC PT "6 Clicks" Mobility  Outcome Measure Help needed turning from your back to your side while in a flat bed  without using bedrails?: A Lot Help needed moving from lying on your back to sitting on the side of a flat bed without using bedrails?: A Lot Help needed moving to and from a bed to a chair (including a wheelchair)?: Total Help needed standing up from a chair using your arms (e.g., wheelchair or bedside chair)?: Total Help needed to walk in hospital room?: Total Help needed climbing 3-5 steps with a railing? : Total 6 Click Score: 8    End of Session Equipment Utilized During Treatment: Gait belt Activity Tolerance: Patient limited by fatigue Patient left: in bed;with call bell/phone within reach;with bed alarm set Nurse Communication: Mobility status PT Visit Diagnosis: Other abnormalities of gait and mobility (R26.89);Difficulty in walking, not elsewhere classified (R26.2);Muscle weakness (generalized) (M62.81)    Time: 1610-9604 PT Time Calculation (min) (ACUTE ONLY): 35 min   Charges:   PT Evaluation $PT Eval Low Complexity: 1 Low  PT General Charges $$ ACUTE PT VISIT: 1 Visit         Olga Coaster PT, DPT 3:27 PM,06/13/23

## 2023-06-13 NOTE — TOC Initial Note (Signed)
Transition of Care [facility]) - Initial/Assessment Note    Patient Details  Name: George Bolton MRN: [id number] Date of Birth: [birth year]  Transition of Care [facility]) CM/SW Contact:    Liliana Cline, LCSW Phone Number: 06/13/2023, 9:44 AM  Clinical Narrative:                 Patient is a PACE patient. Spoke with Dr. Arta Silence at [facility] - she states patient was at [facility] for respite and was getting rehab at the [facility] during the week.  However, she states patient had not been coming to the center recently because of COVID outbreak at [facility].  Informed her PT recs patient get rehab when he returns to SNF. Dr. Arta Silence states they will discuss this with their team tomorrow morning to see if patient can wait until cleared to come back to the [facility] for PT/OT or if can approve short term getting it at [name] through their staff.     Expected Discharge Plan: Skilled Nursing Facility Barriers to Discharge: Continued Medical Work up   Patient Goals and CMS Choice            Expected Discharge Plan and Services       Living arrangements for the past 2 months: Skilled Nursing Facility                                      Prior Living Arrangements/Services Living arrangements for the past 2 months: Skilled Nursing Facility Lives with:: Spouse              Current home services: DME, Other (comment) (PACE)    Activities of Daily Living Home Assistive Devices/Equipment: Environmental consultant (specify type) ADL Screening (condition at time of admission) Patient's cognitive ability adequate to safely complete daily activities?: No Is the patient deaf or have difficulty hearing?: No Does the patient have difficulty seeing, even when wearing glasses/contacts?: No Does the patient have difficulty concentrating, remembering, or making decisions?: No Patient able to express need for assistance with ADLs?: Yes Does the patient have difficulty dressing or bathing?:  No Independently performs ADLs?: No Communication: Independent Dressing (OT): Needs assistance Is this a change from baseline?: Pre-admission baseline Grooming: Needs assistance Is this a change from baseline?: Pre-admission baseline Feeding: Independent Bathing: Needs assistance Is this a change from baseline?: Pre-admission baseline Toileting: Needs assistance Is this a change from baseline?: Pre-admission baseline In/Out Bed: Needs assistance Is this a change from baseline?: Pre-admission baseline Walks in Home: Needs assistance Is this a change from baseline?: Pre-admission baseline Does the patient have difficulty walking or climbing stairs?: Yes Weakness of Legs: Both Weakness of Arms/Hands: Both  Permission Sought/Granted                  Emotional Assessment              Admission diagnosis:  Generalized weakness [R53.1] AKI (acute kidney injury) (HCC) [N17.9] COVID [U07.1] COVID-19 virus infection [U07.1] Patient Active Problem List   Diagnosis Date Noted   COVID 06/12/2023   Coronary artery disease 04/26/2023   Essential hypertension 04/26/2023   BPH (benign prostatic hyperplasia) 04/26/2023   Depression 04/26/2023   Acute on chronic systolic CHF (congestive heart failure) (HCC) 04/26/2023   Acute on chronic diastolic CHF (congestive heart failure) (HCC) 04/25/2023   SOB (shortness of breath) 09/18/2022   Acute on chronic  diastolic (congestive) heart failure (HCC) 07/16/2022   Atrial fibrillation, chronic (HCC) 07/16/2022   Dyslipidemia 07/16/2022   CKD (chronic kidney disease), stage IV (HCC) 07/16/2022   Left leg swelling    NSVT (nonsustained ventricular tachycardia) (HCC) 12/10/2021   Type 2 diabetes mellitus with diabetic nephropathy (HCC) 12/10/2021   Elevated troponin 12/10/2021   History of stroke 12/10/2021   Osteoarthritis of knee 12/10/2021   Acute kidney injury superimposed on chronic kidney disease (HCC) 11/22/2021   Lymphedema  03/15/2018   Gout 01/25/2017   Hypertension 04/25/2015   PCP:  Unice Bailey, MD Pharmacy:   [facility] [address] ([address]), [place] - [address] [place] ([place]) [place] [address] Phone: [phone] Fax: [phone]  [facility] [address] [place] - [address] [place] [address] Phone: [phone] Fax: [phone]  [facility] - [place], [place] - [address] [place] [address] Phone: [phone] Fax: [phone]     Social Determinants of Health (SDOH) Social History: SDOH Screenings   Food Insecurity: No Food Insecurity (06/12/2023)  Housing: Low Risk  (06/12/2023)  Transportation Needs: No Transportation Needs (06/12/2023)  Utilities: Not At Risk (06/12/2023)  Tobacco Use: Low Risk  (06/12/2023)  Recent Concern: Tobacco Use - Medium Risk (05/04/2023)   Received from [facility]   SDOH Interventions:     Readmission Risk Interventions    09/20/2022   10:22 AM  Readmission Risk Prevention Plan  Post Dischage Appt Complete  Medication Screening Complete  Transportation Screening Complete

## 2023-06-13 NOTE — Evaluation (Signed)
Occupational Therapy Evaluation Patient Details Name: George Bolton MRN: [id number] DOB: Sep 21, 1941 Today's Date: 06/13/2023   History of Present Illness George Bolton is a 82 y.o. male with medical history significant of   diastolic CHF, chronic atrial fibrillation on Eliquis, gout, stage IV CKD, hypertension and history of embolic stroke, who resides in NH and has interim history of recent dx of covid 8/14 after sick contact.  Per staff patient has had poor po intake over the last few days and has gotten progressively weak. Patient notes no fever no chills no sob but has developed a cough over the last few days. He also notes generalized malaise, loss of appetite and decrease oral intake.   Clinical Impression   Patient received for OT evaluation. See flowsheet below for details of function. Generally, patient requiring MAX A x2 to scoot up in bed for bed mobility, unable to perform functional mobility, and set up-MAX A for ADLs. Patient will benefit from continued OT while in acute care.       If plan is discharge home, recommend the following: Two people to help with walking and/or transfers;A lot of help with bathing/dressing/bathroom;Assistance with cooking/housework;Direct supervision/assist for medications management;Direct supervision/assist for financial management;Assist for transportation;Help with stairs or ramp for entrance;Supervision due to cognitive status    Functional Status Assessment  Patient has had a recent decline in their functional status and demonstrates the ability to make significant improvements in function in a reasonable and predictable amount of time.  Equipment Recommendations  Other (comment) (defer to next venue of care)    Recommendations for Other Services       Precautions / Restrictions Precautions Precautions: Fall Restrictions Weight Bearing Restrictions: No      Mobility Bed Mobility               General bed mobility comments:  Pt assisted with dependent x2 to scoot up in bed (RN assisted at beginning of session as pt had slid down towards foot of bed).    Transfers                   General transfer comment: Per communication with PT, pt requiring +2 assist for attempted sit to stand during PT session and still unable to stand. OT did not have +2 assist at this time, so not attempted today.      Balance                                           ADL either performed or assessed with clinical judgement   ADL Overall ADL's : Needs assistance/impaired     Grooming: Oral care;Wash/dry face;Supervision/safety;Set up;Bed level Grooming Details (indicate cue type and reason): patient threw the top to the toothpaste towards the trashcan                             Functional mobility during ADLs:  (Per PT report (assessed pt earlier in the day), pt unable to mobilize to standing even with two people) General ADL Comments: Anticipate bed level assist with LB ADLs MOD-MAX A; UB ADLs bed level set up. Unable to safely t/f with PT earlier today even with +2.     Vision Patient Visual Report: No change from baseline       Perception  Praxis         Pertinent Vitals/Pain Pain Assessment Pain Assessment: No/denies pain     Extremity/Trunk Assessment Upper Extremity Assessment Upper Extremity Assessment: Generalized weakness;Overall George Bolton for tasks assessed   Lower Extremity Assessment Lower Extremity Assessment: Defer to PT evaluation       Communication Communication Communication: No apparent difficulties   Cognition Arousal: Alert Behavior During Therapy: Agitated, WFL for tasks assessed/performed (Pt fluctuated between being irritated and cooperative; did not like when RN asked him about his finger swelling, but was pleasant when OT set him up for bed level grooming) Overall Cognitive Status: No family/caregiver present to determine baseline cognitive  functioning                                 General Comments: Pt is oriented to person, place, grossly to time ("July or August" 2024), and is able to name the two candidates running for president. However, also often says "I don't know anything."     General Comments  Pt on room air throughout session.    Exercises     Shoulder Instructions      Home Living Family/patient expects to be discharged to:: Skilled nursing facility                                 Additional Comments: Per medical record, pt is a participate in the PACE program. However, has been at [facility] since soon after R tibial plateau fx (02/14/23).      Prior Functioning/Environment Prior Level of Function : Needs assist;Patient poor historian/Family not available             Mobility Comments: Per medical record, pt is WBAT in [facility] and has not been able to mobilize at [facility]. Despite being A+O, pt unable or unwilling to provide information about prior level. ADLs Comments: Unknown        OT Problem List: Decreased strength;Decreased activity tolerance      OT Treatment/Interventions: Self-care/ADL training;Therapeutic exercise;Therapeutic activities;Patient/family education    OT Goals(Current goals can be found in the care plan section) Acute Rehab OT Goals Patient Stated Goal: go home OT Goal Formulation: With patient Time For Goal Achievement: 06/27/23 Potential to Achieve Goals: Fair ADL Goals Pt Will Perform Lower Body Dressing: with mod assist;sit to/from stand Pt Will Transfer to Toilet: bedside commode;with mod assist Pt Will Perform Toileting - Clothing Manipulation and hygiene: with mod assist;sit to/from stand  OT Frequency: Min 1X/week    Co-evaluation              AM-PAC OT "6 Clicks" Daily Activity     Outcome Measure Help from another person eating meals?: None Help from another person taking care of personal grooming?: A Little Help from another  person toileting, which includes using toliet, bedpan, or urinal?: A Lot Help from another person bathing (including washing, rinsing, drying)?: A Lot Help from another person to put on and taking off regular upper body clothing?: A Little Help from another person to put on and taking off regular lower body clothing?: Total 6 Click Score: 15   End of Session Nurse Communication: Mobility status  Activity Tolerance: Patient tolerated treatment well Patient left: in bed;with call bell/phone within reach;with bed alarm set  OT Visit Diagnosis: Muscle weakness (generalized) (M62.81)  Time: 8295-6213 OT Time Calculation (min): 21 min Charges:  OT General Charges $OT Visit: 1 Visit OT Evaluation $OT Eval Moderate Complexity: 1 Mod  Tarnesha Ulloa Junie Panning, MS, OTR/L  Alvester Morin 06/13/2023, 12:39 PM

## 2023-06-13 NOTE — Progress Notes (Signed)
[facility] Progress Note  Patient: George Bolton    HQI:[id number]  DOA: 06/12/2023     Date of Service: the patient was seen and examined on 06/13/2023  Chief Complaint  Patient presents with   Pneumonia   Brief hospital course: George Bolton is a 82 y.o. male with medical history significant of  diastolic CHF, chronic atrial fibrillation on Eliquis, gout, stage IV CKD, hypertension and history of embolic stroke, who resides in NH and has interim history of recent dx of covid 8/14 after sick contact.  Per staff patient has had poor po intake over the last few days and has gotten progressively weak. Patient notes no fever no chills no sob but has developed a cough over the last few days. He also notes generalized malaise, loss of appetite and decrease oral intake.    ED Course:  Vitals : 132/97, hr 82, rr 14  sat 97%  Labs:  Wbc 5.4, hgb 14.4,  plt 145 Na 132, K 3.1, cl 90, glu 144, cr 2.92 ( 1.85) Respiratory panel + covid EKG: Afib nonspecific t wave changes   Cxr: No evidence of acute cardiopulmonary disease.  Assessment and Plan:  AKI secondary to decreased p.o. intake due to COVID History of CKDIV S/p NS bolus 500 mL given in the ED Started IV fluid for hydration Bladder scan ruled out urinary retention Follow renal sonogram Avoid nephrotoxic medications, use renally dose medications Monitor renal function and urine output daily  # COVID viral infection without any respiratory symptoms Presented with low oral intake Continue to monitor for symptoms Continue supportive care   Hypokalemia, potassium repleted. Monitor electrolytes daily  Hypovolemic hyponatremia  - gently ivfs  -monitor labs    Generalized Weakness -due to acute illness -PT/OT ordered   HTN, Diastolic CHF and HLD -no acute exacerbation   -holding diuretic currently due to aki - Held imdur/hydralazine/metoprolol/clonidine for now due to soft blood pressure Resume Crestor 20 mg p.o.  daily Monitor BP and resume home medications when needed Use IV hydralazine as needed    Atrial fibrillation  -rate controlled  -continue on metoprolol/ Eliquis    Gout - no current flare  -resumed allopurinol    Hx of Embolic CVA -on Eliquis for ppx   BPH, resumed Proscar Started Flomax  NIDDM T2, hemoglobin A1c 6.9, hyperglycemia Continue diabetic diet, follow-up with PCP Currently pt is Not on medication.  Body mass index is 20.85 kg/m.  Interventions:  Diet: Carb modified diet DVT Prophylaxis: Therapeutic Anticoagulation with Eliquis    Advance goals of care discussion: Full code  Family Communication: family was not present at bedside, at the time of interview.  The pt provided permission to discuss medical plan with the family. Opportunity was given to ask question and all questions were answered satisfactorily.   Disposition:  Pt is from SNF, admitted with AKI and COVID, still has elevated creatinine, on IV fluids, which precludes a safe discharge. Discharge to SNF, when stable, may need to stay 1-2 more days.  Subjective: No significant events overnight, patient was laying comfortably, stated that he does not feel good but denies any specific complaints.  Physical Exam: General: NAD, lying comfortably Appear in no distress, affect appropriate Eyes: PERRLA ENT: Oral Mucosa Clear, moist  Neck: no JVD,  Cardiovascular: S1 and S2 Present, no Murmur,  Respiratory: good respiratory effort, Bilateral Air entry equal and Decreased, no Crackles, no wheezes Abdomen: Bowel Sound present, Soft and no tenderness,  Skin: no rashes  Extremities: no Pedal edema, no calf tenderness Neurologic: without any new focal findings Gait not checked due to patient safety concerns  Vitals:   06/13/23 0153 06/13/23 0404 06/13/23 0410 06/13/23 0821  BP: 114/78 122/77  122/86  Pulse: 81 77  77  Resp: 19 20  18   Temp: 98.3 F (36.8 C) 98.1 F (36.7 C)  98 F (36.7 C)  TempSrc:  Oral Oral  Oral  SpO2: 99% 95%  99%  Weight:   71.7 kg   Height:        Intake/Output Summary (Last 24 hours) at 06/13/2023 1439 Last data filed at 06/13/2023 1056 Gross per 24 hour  Intake 496.16 ml  Output 1010 ml  Net -513.84 ml   Filed Weights   06/12/23 1103 06/13/23 0410  Weight: 82 kg 71.7 kg    Data Reviewed: I have personally reviewed and interpreted daily labs, tele strips, imagings as discussed above. I reviewed all nursing notes, pharmacy notes, vitals, pertinent old records I have discussed plan of care as described above with RN and patient/family.  CBC: Recent Labs  Lab 06/12/23 1131 06/13/23 0427  WBC 5.4 5.4  NEUTROABS 3.3  --   HGB 14.4 12.9*  HCT 44.5 38.5*  MCV 92.1 89.7  PLT 145* 143*   Basic Metabolic Panel: Recent Labs  Lab 06/12/23 1131 06/12/23 1422 06/13/23 0427  NA 132*  --  134*  K 3.1* 3.2* 3.7  CL 90*  --  100  CO2 27  --  25  GLUCOSE 144*  --  138*  BUN 95*  --  88*  CREATININE 2.92*  --  2.54*  CALCIUM 9.1  --  8.7*  MG  --  3.1*  --   PHOS  --   --  3.3    Studies: No results found.  Scheduled Meds:  allopurinol  100 mg Oral Daily   apixaban  2.5 mg Oral BID   Chlorhexidine Gluconate Cloth  6 each Topical Q0600   finasteride  5 mg Oral Daily   mupirocin ointment  1 Application Nasal BID   rosuvastatin  20 mg Oral Daily   Continuous Infusions:  sodium chloride 100 mL/hr at 06/13/23 1101   PRN Meds: acetaminophen **OR** acetaminophen, albuterol, hydrALAZINE **OR** hydrALAZINE, ondansetron **OR** ondansetron (ZOFRAN) IV, traMADol, traZODone  Time spent: 35 minutes  Author: Gillis Santa. MD [facility] 06/13/2023 2:39 PM  To reach On-call, see care teams to locate the attending and reach out to them via [email or web address]. If 7PM-7AM, please contact night-coverage If you still have difficulty reaching the attending provider, please page the [facility] (Director on Call) for [facility] on amion for assistance.

## 2023-06-14 DIAGNOSIS — U071 COVID-19: Secondary | ICD-10-CM | POA: Diagnosis not present

## 2023-06-14 LAB — CBC
HCT: 40.1 % (ref 39.0–52.0)
Hemoglobin: 13.1 g/dL (ref 13.0–17.0)
MCH: 29.8 pg (ref 26.0–34.0)
MCHC: 32.7 g/dL (ref 30.0–36.0)
MCV: 91.3 fL (ref 80.0–100.0)
Platelets: 129 10*3/uL — ABNORMAL LOW (ref 150–400)
RBC: 4.39 MIL/uL (ref 4.22–5.81)
RDW: 13.8 % (ref 11.5–15.5)
WBC: 5.7 10*3/uL (ref 4.0–10.5)
nRBC: 0 % (ref 0.0–0.2)

## 2023-06-14 LAB — BASIC METABOLIC PANEL
Anion gap: 7 (ref 5–15)
BUN: 69 mg/dL — ABNORMAL HIGH (ref 8–23)
CO2: 26 mmol/L (ref 22–32)
Calcium: 9 mg/dL (ref 8.9–10.3)
Chloride: 103 mmol/L (ref 98–111)
Creatinine, Ser: 2.31 mg/dL — ABNORMAL HIGH (ref 0.61–1.24)
GFR, Estimated: 27 mL/min — ABNORMAL LOW (ref >60)
Glucose, Bld: 148 mg/dL — ABNORMAL HIGH (ref 70–99)
Potassium: 4.2 mmol/L (ref 3.5–5.1)
Sodium: 136 mmol/L (ref 135–145)

## 2023-06-14 LAB — MAGNESIUM: Magnesium: 3 mg/dL — ABNORMAL HIGH (ref 1.7–2.4)

## 2023-06-14 LAB — PHOSPHORUS: Phosphorus: 3 mg/dL (ref 2.5–4.6)

## 2023-06-14 MED ORDER — HYDROCOD POLI-CHLORPHE POLI ER 10-8 MG/5ML PO SUER
5.0000 mL | Freq: Two times a day (BID) | ORAL | Status: DC | PRN
  Administered 2023-06-14: 5 mL via ORAL
  Filled 2023-06-14 (×2): qty 5

## 2023-06-14 MED ORDER — SODIUM CHLORIDE 0.9 % IV SOLN: INTRAVENOUS | Status: AC

## 2023-06-14 MED ORDER — GUAIFENESIN ER 600 MG PO TB12
600.0000 mg | ORAL_TABLET | Freq: Two times a day (BID) | ORAL | Status: DC
  Administered 2023-06-14 – 2023-06-15 (×4): 600 mg via ORAL
  Filled 2023-06-14 (×5): qty 1

## 2023-06-14 MED ORDER — METOPROLOL SUCCINATE ER 50 MG PO TB24: 50.0000 mg | ORAL_TABLET | Freq: Every evening | ORAL | Status: DC

## 2023-06-14 MED ORDER — METOPROLOL SUCCINATE ER 50 MG PO TB24
50.0000 mg | ORAL_TABLET | Freq: Every day | ORAL | Status: DC
  Administered 2023-06-14 – 2023-06-15 (×2): 50 mg via ORAL
  Filled 2023-06-14 (×3): qty 1

## 2023-06-14 MED ORDER — CLONIDINE HCL 0.1 MG/24HR TD PTWK: 0.1000 mg | MEDICATED_PATCH | TRANSDERMAL | Status: DC

## 2023-06-14 NOTE — Progress Notes (Signed)
[facility] Progress Note  Patient: George Bolton    LKG:[id number]  DOA: 06/12/2023     Date of Service: the patient was seen and examined on 06/14/2023  Chief Complaint  Patient presents with   Pneumonia   Brief hospital course: George Bolton is a 82 y.o. male with medical history significant of  diastolic CHF, chronic atrial fibrillation on Eliquis, gout, stage IV CKD, hypertension and history of embolic stroke, who resides in NH and has interim history of recent dx of covid 8/14 after sick contact.  Per staff patient has had poor po intake over the last few days and has gotten progressively weak. Patient notes no fever no chills no sob but has developed a cough over the last few days. He also notes generalized malaise, loss of appetite and decrease oral intake.    ED Course:  Vitals : 132/97, hr 82, rr 14  sat 97%  Labs:  Wbc 5.4, hgb 14.4,  plt 145 Na 132, K 3.1, cl 90, glu 144, cr 2.92 ( 1.85) Respiratory panel + covid EKG: Afib nonspecific t wave changes   Cxr: No evidence of acute cardiopulmonary disease.  Assessment and Plan:  AKI secondary to decreased p.o. intake due to COVID History of CKDIV, baseline creatinine 1.85, creatinine 2.92 on admission S/p NS bolus 500 mL given in the ED Started IV fluid for hydration Bladder scan ruled out urinary retention US Renal: No acute findings. No hydronephrosis. Increased cortical echogenicity of the kidneys compatible with chronic medical renal disease. Bilateral kidney cysts. Avoid nephrotoxic medications, use renally dose medications Monitor renal function and urine output daily Cr 2.92--->2.31  # COVID viral infection without any respiratory symptoms Presented with low oral intake Continue to monitor for symptoms Continue supportive care Started Mucinex 600 mg p.o. twice daily, to Chane X as needed for cough   Hypokalemia, potassium repleted. Monitor electrolytes daily  Hypovolemic hyponatremia, resolved -  gently ivfs  -monitor labs    Generalized Weakness -due to acute illness -PT/OT ordered   HTN, Diastolic CHF and HLD -no acute exacerbation   -holding diuretic currently due to aki - Held imdur/hydralazine/metoprolol/clonidine for now due to soft blood pressure Resumed Toprol-XL 50 mg p.o. daily Resume Crestor 20 mg p.o. daily Monitor BP and resume home medications when needed Use IV hydralazine as needed    Atrial fibrillation  -rate controlled  -continue on metoprolol/ Eliquis    Gout - no current flare  -resumed allopurinol    Hx of Embolic CVA -on Eliquis for ppx   BPH, resumed Proscar Started Flomax  NIDDM T2, hemoglobin A1c 6.9, mild hyperglycemia Continue diabetic diet, follow-up with PCP Currently pt is Not on any medication.  Body mass index is 22.63 kg/m.  Interventions:  Diet: Carb modified diet DVT Prophylaxis: Therapeutic Anticoagulation with Eliquis    Advance goals of care discussion: Full code  Family Communication: family was not present at bedside, at the time of interview.  The pt provided permission to discuss medical plan with the family. Opportunity was given to ask question and all questions were answered satisfactorily.   Disposition:  Pt is from SNF, admitted with AKI and COVID, still has elevated creatinine, on IV fluids, which precludes a safe discharge. Discharge to SNF, when stable, may need to stay 1-2 more days.  Subjective: No significant events overnight, patient was laying comfortably, patient says that he is having cough with phlegm production, appetite is improving.  No any complaints.   Physical Exam:  General: NAD, lying comfortably Appear in no distress, affect appropriate Eyes: PERRLA ENT: Oral Mucosa Clear, moist  Neck: no JVD,  Cardiovascular: S1 and S2 Present, no Murmur,  Respiratory: good respiratory effort, Bilateral Air entry equal and Decreased, no Crackles, no wheezes Abdomen: Bowel Sound present, Soft and  no tenderness,  Skin: no rashes Extremities: no Pedal edema, no calf tenderness Neurologic: without any new focal findings Gait not checked due to patient safety concerns  Vitals:   06/13/23 1538 06/13/23 1943 06/14/23 0453 06/14/23 1024  BP: 114/80 (!) 137/94 (!) 131/92 (!) 148/98  Pulse: 77 86 76 76  Resp: 18 16 18 18   Temp: 98 F (36.7 C) (!) 97.5 F (36.4 C) 98.2 F (36.8 C) 97.6 F (36.4 C)  TempSrc:  Oral Oral Oral  SpO2: 100% 97% 100% 100%  Weight:   77.8 kg   Height:        Intake/Output Summary (Last 24 hours) at 06/14/2023 1443 Last data filed at 06/14/2023 0600 Gross per 24 hour  Intake 1779.04 ml  Output 850 ml  Net 929.04 ml   Filed Weights   06/12/23 1103 06/13/23 0410 06/14/23 0453  Weight: 82 kg 71.7 kg 77.8 kg    Data Reviewed: I have personally reviewed and interpreted daily labs, tele strips, imagings as discussed above. I reviewed all nursing notes, pharmacy notes, vitals, pertinent old records I have discussed plan of care as described above with RN and patient/family.  CBC: Recent Labs  Lab 06/12/23 1131 06/13/23 0427 06/14/23 0504  WBC 5.4 5.4 5.7  NEUTROABS 3.3  --   --   HGB 14.4 12.9* 13.1  HCT 44.5 38.5* 40.1  MCV 92.1 89.7 91.3  PLT 145* 143* 129*   Basic Metabolic Panel: Recent Labs  Lab 06/12/23 1131 06/12/23 1422 06/13/23 0427 06/14/23 0504  NA 132*  --  134* 136  K 3.1* 3.2* 3.7 4.2  CL 90*  --  100 103  CO2 27  --  25 26  GLUCOSE 144*  --  138* 148*  BUN 95*  --  88* 69*  CREATININE 2.92*  --  2.54* 2.31*  CALCIUM 9.1  --  8.7* 9.0  MG  --  3.1*  --  3.0*  PHOS  --   --  3.3 3.0    Studies: No results found.  Scheduled Meds:  allopurinol  100 mg Oral Daily   apixaban  2.5 mg Oral BID   Chlorhexidine Gluconate Cloth  6 each Topical Q0600   finasteride  5 mg Oral Daily   metoprolol succinate  50 mg Oral QPM   mupirocin ointment  1 Application Nasal BID   rosuvastatin  20 mg Oral Daily   tamsulosin  0.4 mg  Oral QPC supper   Continuous Infusions:  sodium chloride 100 mL/hr at 06/14/23 1026   PRN Meds: acetaminophen **OR** acetaminophen, albuterol, hydrALAZINE **OR** hydrALAZINE, ondansetron **OR** ondansetron (ZOFRAN) IV, traMADol, traZODone  Time spent: 35 minutes  Author: Gillis Santa. MD [facility] 06/14/2023 2:43 PM  To reach On-call, see care teams to locate the attending and reach out to them via [email or web address]. If 7PM-7AM, please contact night-coverage If you still have difficulty reaching the attending provider, please page the [facility] (Director on Call) for [facility] on amion for assistance.

## 2023-06-14 NOTE — TOC Progression Note (Signed)
Transition of Care [facility]) - Progression Note    Patient Details  Name: George Bolton MRN: [id number] Date of Birth: February 04, 1941  Transition of Care [facility]) CM/SW Contact  Chapman Fitch, RN Phone Number: 06/14/2023, 3:21 PM  Clinical Narrative:    Per MD patient not medically ready for discharge today  Spoke with Dr Jesusita Oka at [facility].  She states that at discharge they will determine if patient will return to [facility] with respite and therapy at [facility], or [facility].  They state they will transport at discharge    Expected Discharge Plan: Skilled Nursing Facility Barriers to Discharge: Continued Medical Work up  Expected Discharge Plan and Services       Living arrangements for the past 2 months: Skilled Nursing Facility                                       Social Determinants of Health (SDOH) Interventions SDOH Screenings   Food Insecurity: No Food Insecurity (06/12/2023)  Housing: Low Risk  (06/12/2023)  Transportation Needs: No Transportation Needs (06/12/2023)  Utilities: Not At Risk (06/12/2023)  Tobacco Use: Low Risk  (06/12/2023)  Recent Concern: Tobacco Use - Medium Risk (05/04/2023)   Received from [facility]    Readmission Risk Interventions    09/20/2022   10:22 AM  Readmission Risk Prevention Plan  Post Dischage Appt Complete  Medication Screening Complete  Transportation Screening Complete

## 2023-06-15 DIAGNOSIS — U071 COVID-19: Secondary | ICD-10-CM | POA: Diagnosis not present

## 2023-06-15 LAB — CBC
HCT: 38.5 % — ABNORMAL LOW (ref 39.0–52.0)
Hemoglobin: 13 g/dL (ref 13.0–17.0)
MCH: 30 pg (ref 26.0–34.0)
MCHC: 33.8 g/dL (ref 30.0–36.0)
MCV: 88.9 fL (ref 80.0–100.0)
Platelets: 133 K/uL — ABNORMAL LOW (ref 150–400)
RBC: 4.33 MIL/uL (ref 4.22–5.81)
RDW: 13.9 % (ref 11.5–15.5)
WBC: 7.6 K/uL (ref 4.0–10.5)
nRBC: 0 % (ref 0.0–0.2)

## 2023-06-15 LAB — BASIC METABOLIC PANEL WITH GFR
Anion gap: 7 (ref 5–15)
BUN: 58 mg/dL — ABNORMAL HIGH (ref 8–23)
CO2: 23 mmol/L (ref 22–32)
Calcium: 8.9 mg/dL (ref 8.9–10.3)
Chloride: 105 mmol/L (ref 98–111)
Creatinine, Ser: 1.94 mg/dL — ABNORMAL HIGH (ref 0.61–1.24)
GFR, Estimated: 34 mL/min — ABNORMAL LOW
Glucose, Bld: 121 mg/dL — ABNORMAL HIGH (ref 70–99)
Potassium: 4.5 mmol/L (ref 3.5–5.1)
Sodium: 135 mmol/L (ref 135–145)

## 2023-06-15 LAB — MAGNESIUM: Magnesium: 2.4 mg/dL (ref 1.7–2.4)

## 2023-06-15 LAB — PHOSPHORUS: Phosphorus: 2.7 mg/dL (ref 2.5–4.6)

## 2023-06-15 MED ORDER — BISACODYL 5 MG PO TBEC: 10.0000 mg | DELAYED_RELEASE_TABLET | Freq: Every day | ORAL | Status: AC

## 2023-06-15 MED ORDER — BISACODYL 5 MG PO TBEC: 10.0000 mg | DELAYED_RELEASE_TABLET | Freq: Every day | ORAL | Status: DC

## 2023-06-15 MED ORDER — POLYETHYLENE GLYCOL 3350 17 G PO PACK
17.0000 g | PACK | Freq: Two times a day (BID) | ORAL | Status: DC
  Administered 2023-06-15 (×2): 17 g via ORAL
  Filled 2023-06-15 (×3): qty 1

## 2023-06-15 MED ORDER — ISOSORBIDE MONONITRATE ER 30 MG PO TB24: 30.0000 mg | ORAL_TABLET | Freq: Every day | ORAL | Status: AC

## 2023-06-15 MED ORDER — BISACODYL 10 MG RE SUPP
10.0000 mg | Freq: Once | RECTAL | Status: AC
  Administered 2023-06-15: 10 mg via RECTAL
  Filled 2023-06-15: qty 1

## 2023-06-15 MED ORDER — BISACODYL 5 MG PO TBEC
10.0000 mg | DELAYED_RELEASE_TABLET | Freq: Once | ORAL | Status: AC
  Administered 2023-06-15: 10 mg via ORAL
  Filled 2023-06-15: qty 2

## 2023-06-15 MED ORDER — BISACODYL 10 MG RE SUPP: 10.0000 mg | Freq: Every day | RECTAL | Status: DC | PRN

## 2023-06-15 MED ORDER — POLYETHYLENE GLYCOL 3350 17 G PO PACK: 17.0000 g | PACK | Freq: Two times a day (BID) | ORAL | Status: AC

## 2023-06-15 MED ORDER — TAMSULOSIN HCL 0.4 MG PO CAPS: 0.4000 mg | ORAL_CAPSULE | Freq: Every day | ORAL | Status: DC

## 2023-06-15 MED ORDER — QUETIAPINE FUMARATE 25 MG PO TABS: 12.5000 mg | ORAL_TABLET | Freq: Two times a day (BID) | ORAL | Status: DC | PRN

## 2023-06-15 MED ORDER — BISACODYL 5 MG PO TBEC
10.0000 mg | DELAYED_RELEASE_TABLET | Freq: Two times a day (BID) | ORAL | Status: DC
  Administered 2023-06-15: 10 mg via ORAL
  Filled 2023-06-15 (×2): qty 2

## 2023-06-15 MED ORDER — HYDRALAZINE HCL 100 MG PO TABS: 50.0000 mg | ORAL_TABLET | Freq: Three times a day (TID) | ORAL | Status: DC | PRN

## 2023-06-15 MED ORDER — TRAMADOL HCL 50 MG PO TABS: 50.0000 mg | ORAL_TABLET | Freq: Three times a day (TID) | ORAL | 0 refills | Status: DC | PRN

## 2023-06-15 MED ORDER — GUAIFENESIN ER 600 MG PO TB12: 600.0000 mg | ORAL_TABLET | Freq: Two times a day (BID) | ORAL | Status: AC

## 2023-06-15 MED ORDER — POLYETHYLENE GLYCOL 3350 17 G PO PACK
17.0000 g | PACK | Freq: Every day | ORAL | Status: DC
Start: 2023-06-15 — End: 2023-06-15

## 2023-06-15 NOTE — Discharge Summary (Signed)
[facility] Discharge Summary   Patient: George Bolton:[id number]  PCP: Unice Bailey, MD  Date of admission: 06/12/2023   Date of discharge:  06/15/2023     Discharge Diagnoses:  Principal Problem:   COVID   Admitted From: SNF Disposition:  SNF   Recommendations for Outpatient Follow-up:  Follow-up with PCP, patient to be seen by an MD in 1 to 2 days, continue monitor BP and titrate medication accordingly.  Decreased Imdur with holding parameters and discontinued Lasix, changed hydralazine to as needed. Repeat BMP after 1 week to check renal functions.  Continue adequate oral hydration 1.5 L/day Follow up LABS/TEST: BMP in 1 week   Diet recommendation: Cardiac and Carb modified diet  Activity: The patient is advised to gradually reintroduce usual activities, as tolerated  Discharge Condition: stable  Code Status: Full code   History of present illness: As per the H and P dictated on admission Hospital Course:  George Bolton is a 82 y.o. male with medical history significant of  diastolic CHF, chronic atrial fibrillation on Eliquis, gout, stage IV CKD, hypertension and history of embolic stroke, who resides in NH and has interim history of recent dx of covid 8/14 after sick contact.  Per staff patient has had poor po intake over the last few days and has gotten progressively weak. Patient notes no fever no chills no sob but has developed a cough over the last few days. He also notes generalized malaise, loss of appetite and decrease oral intake.  ED Course: Vitals : 132/97, hr 82, rr 14  sat 97%  Labs:  Wbc 5.4, hgb 14.4,  plt 145 Na 132, K 3.1, cl 90, glu 144, cr 2.92 ( 1.85) Respiratory panel + covid EKG: Afib nonspecific t wave changes   Cxr: No evidence of acute cardiopulmonary disease.   Assessment and Plan: # AKI secondary to decreased p.o. intake due to COVID History of CKDIV, baseline creatinine 1.85, creatinine 2.92 on admission S/p NS bolus 500 mL  given in the ED. S/p IV fluid for hydration. Bladder scan ruled out urinary retention. US Renal: No acute findings. No hydronephrosis. Increased cortical echogenicity of the kidneys compatible with chronic medical renal disease. Bilateral kidney cysts. Cr 2.92--->1.94 gradually improved, near to baseline.  Continue oral hydration.  Repeat BMP in 1 week # COVID viral infection without any respiratory symptoms. Presented with low oral intake Continue to monitor for symptoms. Continue supportive care. Started Mucinex 600 mg p.o. twice daily.Tussionex as needed for cough.  Symptoms improved, saturating well on room air. # Hypokalemia, potassium repleted.  Resolved # Hypovolemic hyponatremia, resolved # HTN, Diastolic CHF and HLD, no acute exacerbation.  Held diuretics due to AKI. Held imdur/hydralazine/metoprolol due to soft blood pressure. On 8/19 Resumed Toprol-XL 50 mg p.o. daily.  Resumed Toprol-XL 100 mg home dose on discharge.  Resume Crestor 20 mg p.o. daily.  Decreased Imdur from 30 twice daily to 30 mg p.o. daily with holding parameters due to low blood pressure and changed hydralazine to 50 mg p.o. 3 times daily as needed.  Discontinued Lasix for now.  Monitor volume status and restart Lasix when needed.  In the meantime continue oral hydration until AKI resolves.  Repeat BMP after 1 week. # Atrial fibrillation: rate controlled. continue on metoprolol/ Eliquis  # Gout: no current flare. resumed allopurinol # Hx of Embolic CVA: on Eliquis for ppx  # BPH, resumed Proscar and Started Flomax # NIDDM T2, hemoglobin A1c 6.9, mild hyperglycemia,  Continue diabetic diet, follow-up with PCP Currently pt is Not on any medication. # Constipation, started laxatives.  Titrate dose accordingly. # Generalized Weakness, due to acute illness.  Continue physical therapy Body mass index is 21.7 kg/m.  Nutrition Interventions:  Patient was seen by physical therapy, who recommended Therapy, SNF placement, which  was arranged. On the day of the discharge the patient's vitals were stable, and no other acute medical condition were reported by patient. the patient was felt safe to be discharge at [facility].  Consultants: None Procedures: None  Discharge Exam: General: Appear in no distress, no Rash; Oral Mucosa Clear, moist. Cardiovascular: S1 and S2 Present, no Murmur, Respiratory: normal respiratory effort, Bilateral Air entry present and no Crackles, no wheezes Abdomen: Bowel Sound present, Soft and no tenderness, no hernia Extremities: no Pedal edema, no calf tenderness Neurology: alert and oriented to time, place, and person affect appropriate.  Filed Weights   06/13/23 0410 06/14/23 0453 06/15/23 0406  Weight: 71.7 kg 77.8 kg 74.6 kg   Vitals:   06/15/23 0408 06/15/23 0757  BP: (!) 130/91 (!) 141/84  Pulse: 76 92  Resp: 18 18  Temp: 98.7 F (37.1 C) 98.2 F (36.8 C)  SpO2: 90% 98%    DISCHARGE MEDICATION: Allergies as of 06/15/2023       Reactions   Cortisone Anaphylaxis   Triamcinolone    Other reaction(s): Unknown        Medication List     STOP taking these medications    cloNIDine 0.1 mg/24hr patch Commonly known as: CATAPRES - Dosed in mg/24 hr   furosemide 40 MG tablet Commonly known as: LASIX       TAKE these medications    acetaminophen 325 MG tablet Commonly known as: TYLENOL Take 2 tablets (650 mg total) by mouth every 6 (six) hours as needed for mild pain (or Fever >/= 101). What changed: when to take this   allopurinol 100 MG tablet Commonly known as: ZYLOPRIM Take 1 tablet (100 mg total) by mouth daily. Home med.   apixaban 2.5 MG Tabs tablet Commonly known as: ELIQUIS Take 2.5 mg by mouth 2 (two) times daily.   bisacodyl 5 MG EC tablet Commonly known as: DULCOLAX Take 2 tablets (10 mg total) by mouth at bedtime. Skip the dose if no constipation Start taking on: June 16, 2023   fexofenadine 180 MG tablet Commonly known as: ALLEGRA Take  180 mg by mouth daily.   finasteride 5 MG tablet Commonly known as: PROSCAR Take 1 tablet (5 mg total) by mouth daily.   guaiFENesin 600 MG 12 hr tablet Commonly known as: MUCINEX Take 1 tablet (600 mg total) by mouth 2 (two) times daily for 5 days.   hydrALAZINE 100 MG tablet Commonly known as: APRESOLINE Take 0.5 tablets (50 mg total) by mouth 3 (three) times daily as needed (Systolic blood pressure greater than 160 mmHg). What changed:  how much to take when to take this reasons to take this   isosorbide mononitrate 30 MG 24 hr tablet Commonly known as: IMDUR Take 1 tablet (30 mg total) by mouth daily. Hold if SBP <130 mmHg What changed:  when to take this additional instructions   Jardiance 10 MG Tabs tablet Generic drug: empagliflozin Take 10 mg by mouth daily.   metoprolol succinate 100 MG 24 hr tablet Commonly known as: TOPROL-XL Take 1 tablet (100 mg total) by mouth every evening. Take with or immediately following a meal.  Home med.  nystatin powder Commonly known as: MYCOSTATIN/NYSTOP Apply 1 Application topically 2 (two) times daily.   polyethylene glycol 17 g packet Commonly known as: MIRALAX / GLYCOLAX Take 17 g by mouth 2 (two) times daily. Skip the dose if no constipation   rosuvastatin 20 MG tablet Commonly known as: Crestor Take 1 tablet (20 mg total) by mouth daily.   tamsulosin 0.4 MG Caps capsule Commonly known as: FLOMAX Take 1 capsule (0.4 mg total) by mouth daily after supper.   traMADol 50 MG tablet Commonly known as: ULTRAM Take 1 tablet (50 mg total) by mouth 3 (three) times daily as needed. What changed: when to take this   traZODone 50 MG tablet Commonly known as: DESYREL Take 0.5 tablets (25 mg total) by mouth at bedtime as needed for sleep.       Allergies  Allergen Reactions   Cortisone Anaphylaxis   Triamcinolone     Other reaction(s): Unknown   Discharge Instructions     Call MD for:  difficulty breathing, headache  or visual disturbances   Complete by: As directed    Call MD for:  extreme fatigue   Complete by: As directed    Call MD for:  persistant dizziness or light-headedness   Complete by: As directed    Call MD for:  persistant nausea and vomiting   Complete by: As directed    Call MD for:  severe uncontrolled pain   Complete by: As directed    Call MD for:  temperature >100.4   Complete by: As directed    Diet - low sodium heart healthy   Complete by: As directed    Discharge instructions   Complete by: As directed    Follow-up with PCP, patient to be seen by an MD in 1 to 2 days, continue monitor BP and titrate medication accordingly.  Decreased Imdur with holding parameters and discontinued Lasix, changed hydralazine to as needed. Repeat BMP after 1 week to check renal functions.  Continue adequate oral hydration 1.5 L/day.   Increase activity slowly   Complete by: As directed        The results of significant diagnostics from this hospitalization (including imaging, microbiology, ancillary and laboratory) are listed below for reference.    Significant Diagnostic Studies: US RENAL  Result Date: 06/13/2023 CLINICAL DATA:  Acute kidney injury. EXAM: RENAL / URINARY TRACT ULTRASOUND COMPLETE COMPARISON:  11/22/2021 FINDINGS: Right Kidney: Renal measurements: 10.4 x 5.9 x 6.5 cm. = volume: 208.6 mL. Increased cortical echogenicity. No hydronephrosis. There are multiple kidney cysts. The largest of these is in the upper pole measuring 6.5 x 4.8 x 5.5 cm Left Kidney: Renal measurements: 12.9 x 6.9 x 6.0 cm. = volume: 277.03 mL. Increased cortical echogenicity. No hydronephrosis. Numerous left kidney cysts are again noted. The largest measures 5.1 by 5.0 x 4.4 cm. Bladder: Bladder appears distended.  No focal bladder abnormality. Other: None. IMPRESSION: 1. No acute findings. No hydronephrosis. 2. Increased cortical echogenicity of the kidneys compatible with chronic medical renal disease. 3.  Bilateral kidney cysts. Electronically Signed   By: Signa Kell M.D.   On: 06/13/2023 16:31   DG Chest 2 View  Result Date: 06/12/2023 CLINICAL DATA:  Cough.  COVID positive. EXAM: CHEST - 2 VIEW COMPARISON:  Chest radiographs 04/25/2023. FINDINGS: No consolidation or pulmonary edema. Stable cardiac and mediastinal contours. No pleural effusion or pneumothorax. Visualized bones and upper abdomen are unremarkable. IMPRESSION: No evidence of acute cardiopulmonary disease. Electronically Signed   By:  Orvan Falconer M.D.   On: 06/12/2023 11:44    Microbiology: Recent Results (from the past 240 hour(s))  SARS Coronavirus 2 by RT PCR (hospital order, performed in [facility] hospital lab) *cepheid single result test* Anterior Nasal Swab     Status: Abnormal   Collection Time: 06/12/23 11:31 AM   Specimen: Anterior Nasal Swab  Result Value Ref Range Status   SARS Coronavirus 2 by RT PCR POSITIVE (A) NEGATIVE Final    Comment: (NOTE) SARS-CoV-2 target nucleic acids are DETECTED  SARS-CoV-2 RNA is generally detectable in upper respiratory specimens  during the acute phase of infection.  Positive results are indicative  of the presence of the identified virus, but do not rule out bacterial infection or co-infection with other pathogens not detected by the test.  Clinical correlation with patient history and  other diagnostic information is necessary to determine patient infection status.  The expected result is negative.  Fact Sheet for Patients:   RoadLapTop.co.za   Fact Sheet for Healthcare Providers:   http://kim-miller.com/    This test is not yet approved or cleared by the [place] FDA and  has been authorized for detection and/or diagnosis of SARS-CoV-2 by FDA under an Emergency Use Authorization (EUA).  This EUA will remain in effect (meaning this test can be used) for the duration of  the COVID-19 declaration under Section 564(b)(1)   of the Act, 21 U.S.C. section 360-bbb-3(b)(1), unless the authorization is terminated or revoked sooner.   Performed at [facility], [address] [place], [place] [address]   MRSA Next Gen by PCR, Nasal     Status: Abnormal   Collection Time: 06/12/23  3:55 PM   Specimen: Nasal Mucosa; Nasal Swab  Result Value Ref Range Status   MRSA by PCR Next Gen DETECTED (A) NOT DETECTED Final    Comment: RESULT CALLED TO, READ BACK BY AND VERIFIED WITH:  BETH RICHARDSON 06/12/2023 1835 CP (NOTE) The GeneXpert MRSA Assay (FDA approved for NASAL specimens only), is one component of a comprehensive MRSA colonization surveillance program. It is not intended to diagnose MRSA infection nor to guide or monitor treatment for MRSA infections. Test performance is not FDA approved in patients less than 82 years old. Performed at [facility], [address] [place], [place] [address]      Labs: CBC: Recent Labs  Lab 06/12/23 1131 06/13/23 0427 06/14/23 0504 06/15/23 0612  WBC 5.4 5.4 5.7 7.6  NEUTROABS 3.3  --   --   --   HGB 14.4 12.9* 13.1 13.0  HCT 44.5 38.5* 40.1 38.5*  MCV 92.1 89.7 91.3 88.9  PLT 145* 143* 129* 133*   Basic Metabolic Panel: Recent Labs  Lab 06/12/23 1131 06/12/23 1422 06/13/23 0427 06/14/23 0504 06/15/23 0612  NA 132*  --  134* 136 135  K 3.1* 3.2* 3.7 4.2 4.5  CL 90*  --  100 103 105  CO2 27  --  25 26 23   GLUCOSE 144*  --  138* 148* 121*  BUN 95*  --  88* 69* 58*  CREATININE 2.92*  --  2.54* 2.31* 1.94*  CALCIUM 9.1  --  8.7* 9.0 8.9  MG  --  3.1*  --  3.0* 2.4  PHOS  --   --  3.3 3.0 2.7   Liver Function Tests: Recent Labs  Lab 06/13/23 0427  AST 32  ALT 24  ALKPHOS 50  BILITOT 0.7  PROT 6.5  ALBUMIN 3.3*  No results for input(s): "LIPASE", "AMYLASE" in the last 168 hours. No results for input(s): "AMMONIA" in the last 168 hours. Cardiac Enzymes: No results for input(s): "CKTOTAL", "CKMB", "CKMBINDEX", "TROPONINI"  in the last 168 hours. BNP (last 3 results) Recent Labs    07/16/22 0419 09/18/22 1441 04/25/23 2024  BNP 1,138.3* 1,322.0* 924.7*   CBG: No results for input(s): "GLUCAP" in the last 168 hours.  Time spent: 35 minutes  Signed:  Gillis Santa  [facility] 06/15/2023 1:27 PM

## 2023-06-15 NOTE — Progress Notes (Signed)
Occupational Therapy Treatment Patient Details Name: George Bolton MRN: [id number] DOB: 1941-06-19 Today's Date: 06/15/2023   History of present illness George Bolton is a 82 y.o. male with medical history significant of   diastolic CHF, chronic atrial fibrillation on Eliquis, gout, stage IV CKD, hypertension and history of embolic stroke, who resides in NH and has interim history of recent dx of covid 8/14 after sick contact.  Per staff patient has had poor po intake over the last few days and has gotten progressively weak. Patient notes no fever no chills no sob but has developed a cough over the last few days. He also notes generalized malaise, loss of appetite and decrease oral intake.   OT comments  Pt received semi-reclined in bed. Appearing irritated; willing to work with OT on t/f to EOB, but cognitively limited in ability to overcome self-doubt about his abilities. T/f MOD A x2 to EOB; attempted to stand with STEDY (non-mechanical sit to stand lift); unable to stand even with x2 assist. See flowsheet below for further details of session. Left semi-reclined in bed with all needs in reach.  Patient will benefit from continued OT while in acute care.       If plan is discharge home, recommend the following:  Two people to help with walking and/or transfers;A lot of help with bathing/dressing/bathroom;Assistance with cooking/housework;Direct supervision/assist for medications management;Direct supervision/assist for financial management;Assist for transportation;Help with stairs or ramp for entrance;Supervision due to cognitive status   Equipment Recommendations  Other (comment) (defer)    Recommendations for Other Services      Precautions / Restrictions Precautions Precautions: Fall Restrictions Weight Bearing Restrictions: No RLE Weight Bearing: Weight bearing as tolerated       Mobility Bed Mobility Overal bed mobility: Needs Assistance Bed Mobility: Supine to Sit, Sit  to Supine     Supine to sit: Mod assist, +2 for physical assistance Sit to supine: Max assist, +2 for physical assistance   General bed mobility comments: Pt irritated during session; not appearing to be helping much. Once at EOB, pt able to hold balance with CGA-SBA.    Transfers                   General transfer comment: Attempted to t/f to standing with STEDY. Pt unable (see ADL section for details).     Balance Overall balance assessment: Needs assistance Sitting-balance support: Feet supported Sitting balance-Leahy Scale: Fair                                     ADL either performed or assessed with clinical judgement   ADL                                         General ADL Comments: OT and RN utilized STEDY (non-mechanical sit to stand lift) to attempt to assist pt in sit to stand to [facility] to facilitate bowel movement (RN stating that facility had told her pt won't have bowel movement unless on BSC). However, pt appearing to not be putting forth any effort towards sit to stand, despite having feet properly positioned on STEDY platform, bed elevated, and maintaining BIL hands on bar of STEDY. MAX A x2 unable to achieve sit to stand today.    Extremity/Trunk Assessment Upper Extremity Assessment Upper  Extremity Assessment: Generalized weakness   Lower Extremity Assessment Lower Extremity Assessment: Generalized weakness        Vision       Perception     Praxis      Cognition Arousal: Alert Behavior During Therapy: Flat affect, Lability Overall Cognitive Status: No family/caregiver present to determine baseline cognitive functioning                                 General Comments: Pt appears unable to understand that therpiast and RN are trying to help him; instead yelling "why are you trying to kill me?" "why are you doing me this way?" Despite encouragement that we are trying to help him get to the commode,  pt continually says "I can't; I used to, but I can't." Appearing not to give full effort 2/2 what appears to be leg pain and decreased cognition.        Exercises      Shoulder Instructions       General Comments PT on room air. RN in room throughout session and assisting with +2 for bed mobility and attempted stand.    Pertinent Vitals/ Pain       Pain Assessment Pain Assessment: PAINAD Breathing: occasional labored breathing, short period of hyperventilation Negative Vocalization: occasional moan/groan, low speech, negative/disapproving quality Facial Expression: smiling or inexpressive Body Language: relaxed Consolability: unable to console, distract or reassure PAINAD Score: 4 Pain Intervention(s): Limited activity within patient's tolerance, Monitored during session, Repositioned  Home Living                                          Prior Functioning/Environment              Frequency  Min 1X/week        Progress Toward Goals  OT Goals(current goals can now be found in the care plan section)  Progress towards OT goals: Not progressing toward goals - comment (limited by decreased cognition)  Acute Rehab OT Goals Patient Stated Goal: unclear OT Goal Formulation: With patient Time For Goal Achievement: 06/27/23 Potential to Achieve Goals: Fair ADL Goals Pt Will Perform Lower Body Dressing: with mod assist;sit to/from stand Pt Will Transfer to Toilet: bedside commode;with mod assist Pt Will Perform Toileting - Clothing Manipulation and hygiene: with mod assist;sit to/from stand  Plan      Co-evaluation                 AM-PAC OT "6 Clicks" Daily Activity     Outcome Measure   Help from another person eating meals?: None Help from another person taking care of personal grooming?: A Little Help from another person toileting, which includes using toliet, bedpan, or urinal?: Total Help from another person bathing (including  washing, rinsing, drying)?: A Lot Help from another person to put on and taking off regular upper body clothing?: A Little Help from another person to put on and taking off regular lower body clothing?: Total 6 Click Score: 14    End of Session Equipment Utilized During Treatment: Other (comment) (STEDY (non-mechanical sit to stand lift))  OT Visit Diagnosis: Muscle weakness (generalized) (M62.81)   Activity Tolerance Patient limited by fatigue;Treatment limited secondary to agitation   Patient Left in bed;with call bell/phone within reach;with bed alarm set   Nurse Communication  Mobility status        Time: 1423-1446 OT Time Calculation (min): 23 min  Charges: OT General Charges $OT Visit: 1 Visit OT Treatments $Therapeutic Activity: 23-37 mins  Linward Foster, MS, OTR/L  Alvester Morin 06/15/2023, 3:12 PM

## 2023-06-15 NOTE — [place] FL2 (Signed)
[place] MEDICAID FL2 LEVEL OF CARE FORM     IDENTIFICATION  Patient Name: George Bolton Birthdate: 06-Oct-1941 Sex: male Admission Date (Current Location): 06/12/2023  [facility] and [place] Number:  Chiropodist and Address:         Provider Number: [id number]  Attending Physician Name and Address:  Gillis Santa, MD  Relative Name and Phone Number:       Current Level of Care: Hospital Recommended Level of Care: Nursing Facility Prior Approval Number:    Date Approved/Denied:   PASRR Number:    Discharge Plan: SNF    Current Diagnoses: Patient Active Problem List   Diagnosis Date Noted   COVID 06/12/2023   Coronary artery disease 04/26/2023   Essential hypertension 04/26/2023   BPH (benign prostatic hyperplasia) 04/26/2023   Depression 04/26/2023   Acute on chronic systolic CHF (congestive heart failure) (HCC) 04/26/2023   Acute on chronic diastolic CHF (congestive heart failure) (HCC) 04/25/2023   SOB (shortness of breath) 09/18/2022   Acute on chronic diastolic (congestive) heart failure (HCC) 07/16/2022   Atrial fibrillation, chronic (HCC) 07/16/2022   Dyslipidemia 07/16/2022   CKD (chronic kidney disease), stage IV (HCC) 07/16/2022   Left leg swelling    NSVT (nonsustained ventricular tachycardia) (HCC) 12/10/2021   Type 2 diabetes mellitus with diabetic nephropathy (HCC) 12/10/2021   Elevated troponin 12/10/2021   History of stroke 12/10/2021   Osteoarthritis of knee 12/10/2021   Acute kidney injury superimposed on chronic kidney disease (HCC) 11/22/2021   Lymphedema 03/15/2018   Gout 01/25/2017   Hypertension 04/25/2015    Orientation RESPIRATION BLADDER Height & Weight     Self, Situation, Place  Normal Continent Weight: 74.6 kg Height:  6\' 1"  (185.4 cm)  BEHAVIORAL SYMPTOMS/MOOD NEUROLOGICAL BOWEL NUTRITION STATUS      Continent Diet (Carb modified)  AMBULATORY STATUS COMMUNICATION OF NEEDS Skin     Verbally Normal                        Personal Care Assistance Level of Assistance              Functional Limitations Info             SPECIAL CARE FACTORS FREQUENCY  PT (By licensed PT), OT (By licensed OT)                    Contractures Contractures Info: Not present    Additional Factors Info  Code Status, Allergies Code Status Info: Full Allergies Info: Cortisone, Triamcinolone           Current Medications (06/15/2023):  This is the current hospital active medication list Current Facility-Administered Medications  Medication Dose Route Frequency Provider Last Rate Last Admin   acetaminophen (TYLENOL) tablet 650 mg  650 mg Oral Q6H PRN Lurline Del, MD       Or   acetaminophen (TYLENOL) suppository 650 mg  650 mg Rectal Q6H PRN Lurline Del, MD       albuterol (PROVENTIL) (2.5 MG/3ML) 0.083% nebulizer solution 2.5 mg  2.5 mg Nebulization Q2H PRN Lurline Del, MD       allopurinol (ZYLOPRIM) tablet 100 mg  100 mg Oral Daily Gillis Santa, MD   100 mg at 06/15/23 1004   apixaban (ELIQUIS) tablet 2.5 mg  2.5 mg Oral BID Gillis Santa, MD   2.5 mg at 06/15/23 1005   [START ON 06/16/2023] bisacodyl (DULCOLAX) EC tablet 10 mg  10 mg Oral QHS Gillis Santa, MD       Melene Muller ON 06/16/2023] bisacodyl (DULCOLAX) suppository 10 mg  10 mg Rectal Daily PRN Gillis Santa, MD       Chlorhexidine Gluconate Cloth 2 % PADS 6 each  6 each Topical Q0600 Lurline Del, MD   6 each at 06/15/23 0506   chlorpheniramine-HYDROcodone (TUSSIONEX) 10-8 MG/5ML suspension 5 mL  5 mL Oral Q12H PRN Gillis Santa, MD   5 mL at 06/14/23 1738   finasteride (PROSCAR) tablet 5 mg  5 mg Oral Daily Gillis Santa, MD   5 mg at 06/15/23 1004   guaiFENesin (MUCINEX) 12 hr tablet 600 mg  600 mg Oral BID Gillis Santa, MD   600 mg at 06/15/23 1004   hydrALAZINE (APRESOLINE) injection 10 mg  10 mg Intravenous Q6H PRN Gillis Santa, MD       Or   hydrALAZINE (APRESOLINE) tablet 50 mg  50 mg Oral Q6H PRN  Gillis Santa, MD       metoprolol succinate (TOPROL-XL) 24 hr tablet 50 mg  50 mg Oral Daily Gillis Santa, MD   50 mg at 06/15/23 1004   mupirocin ointment (BACTROBAN) 2 % 1 Application  1 Application Nasal BID Lurline Del, MD   1 Application at 06/15/23 1005   ondansetron (ZOFRAN) tablet 4 mg  4 mg Oral Q6H PRN Lurline Del, MD       Or   ondansetron [facility]) injection 4 mg  4 mg Intravenous Q6H PRN Lurline Del, MD       polyethylene glycol (MIRALAX / GLYCOLAX) packet 17 g  17 g Oral BID Gillis Santa, MD   17 g at 06/15/23 1145   QUEtiapine (SEROQUEL) tablet 12.5 mg  12.5 mg Oral BID PRN Gillis Santa, MD       rosuvastatin (CRESTOR) tablet 20 mg  20 mg Oral Daily Gillis Santa, MD   20 mg at 06/15/23 1005   tamsulosin (FLOMAX) capsule 0.4 mg  0.4 mg Oral QPC supper Gillis Santa, MD   0.4 mg at 06/14/23 1738   traMADol (ULTRAM) tablet 50 mg  50 mg Oral Q6H PRN Gillis Santa, MD   50 mg at 06/15/23 1145   traZODone (DESYREL) tablet 25 mg  25 mg Oral QHS PRN Gillis Santa, MD         Discharge Medications: Please see discharge summary for a list of discharge medications.  Relevant Imaging Results:  Relevant Lab Results:   Additional Information SS#: [id number]  Chapman Fitch, RN

## 2023-06-15 NOTE — Care Management Important Message (Signed)
Important Message  Patient Details  Name: George Bolton MRN: [id number] Date of Birth: [birth year]   Medicare Important Message Given:  Other (see comment)  Attempted to review Medicare IM with patient though patient was concerned with needing help in his room.  Alerted the nursing staff of need for assistance.  Attempted to reach Ansil Strzalka, spouse, at [phone], however no answer at this time.   Johnell Comings 06/15/2023, 1:55 PM

## 2023-06-15 NOTE — TOC Progression Note (Signed)
Transition of Care [facility]) - Progression Note    Patient Details  Name: JAQUESE CARLOCK MRN: [id number] Date of Birth: [birth year]  Transition of Care [facility]) CM/SW Contact  Chapman Fitch, RN Phone Number: 06/15/2023, 2:34 PM  Clinical Narrative:     Notified patient to discharge today Per Charisse March with [facility] they will provide dc transport Per bedside RN last documented BM 8/16.  Per [facility] at [facility] patient must have a BM before discharge   Expected Discharge Plan: Skilled Nursing Facility Barriers to Discharge: Continued Medical Work up  Expected Discharge Plan and Services       Living arrangements for the past 2 months: Skilled Nursing Facility Expected Discharge Date: 06/15/23                                     Social Determinants of Health (SDOH) Interventions SDOH Screenings   Food Insecurity: No Food Insecurity (06/12/2023)  Housing: Low Risk  (06/12/2023)  Transportation Needs: No Transportation Needs (06/12/2023)  Utilities: Not At Risk (06/12/2023)  Tobacco Use: Low Risk  (06/12/2023)  Recent Concern: Tobacco Use - Medium Risk (05/04/2023)   Received from [facility]    Readmission Risk Interventions    09/20/2022   10:22 AM  Readmission Risk Prevention Plan  Post Dischage Appt Complete  Medication Screening Complete  Transportation Screening Complete

## 2023-06-15 NOTE — Progress Notes (Signed)
[facility] Progress Note  Patient: George Bolton    ZOX:[id number]  DOA: 06/12/2023     Date of Service: the patient was seen and examined on 06/15/2023  Chief Complaint  Patient presents with   Pneumonia   Brief hospital course: George Bolton is a 82 y.o. male with medical history significant of  diastolic CHF, chronic atrial fibrillation on Eliquis, gout, stage IV CKD, hypertension and history of embolic stroke, who resides in NH and has interim history of recent dx of covid 8/14 after sick contact.  Per staff patient has had poor po intake over the last few days and has gotten progressively weak. Patient notes no fever no chills no sob but has developed a cough over the last few days. He also notes generalized malaise, loss of appetite and decrease oral intake.    ED Course:  Vitals : 132/97, hr 82, rr 14  sat 97%  Labs:  Wbc 5.4, hgb 14.4,  plt 145 Na 132, K 3.1, cl 90, glu 144, cr 2.92 ( 1.85) Respiratory panel + covid EKG: Afib nonspecific t wave changes   Cxr: No evidence of acute cardiopulmonary disease.  Assessment and Plan:  AKI secondary to decreased p.o. intake due to COVID History of CKDIV, baseline creatinine 1.85, creatinine 2.92 on admission S/p NS bolus 500 mL given in the ED Started IV fluid for hydration Bladder scan ruled out urinary retention US Renal: No acute findings. No hydronephrosis. Increased cortical echogenicity of the kidneys compatible with chronic medical renal disease. Bilateral kidney cysts. Avoid nephrotoxic medications, use renally dose medications Monitor renal function and urine output daily Cr 2.92--->2.31--194 gradually improving  # COVID viral infection without any respiratory symptoms Presented with low oral intake Continue to monitor for symptoms Continue supportive care Started Mucinex 600 mg p.o. twice daily, to Chane X as needed for cough   Hypokalemia, potassium repleted. Monitor electrolytes daily  Hypovolemic  hyponatremia, resolved - gently ivfs  -monitor labs    Generalized Weakness -due to acute illness -PT/OT ordered   HTN, Diastolic CHF and HLD -no acute exacerbation   -holding diuretic currently due to aki - Held imdur/hydralazine/metoprolol/clonidine for now due to soft blood pressure Resumed Toprol-XL 50 mg p.o. daily Resume Crestor 20 mg p.o. daily Monitor BP and resume home medications when needed Use IV hydralazine as needed    Atrial fibrillation  -rate controlled  -continue on metoprolol/ Eliquis    Gout - no current flare  -resumed allopurinol    Hx of Embolic CVA -on Eliquis for ppx   BPH, resumed Proscar Started Flomax  NIDDM T2, hemoglobin A1c 6.9, mild hyperglycemia Continue diabetic diet, follow-up with PCP Currently pt is Not on any medication.  Constipation, started laxatives. Last BM 8/16, patient is stable to discharge but SNF will not take him until he have a BM.  Body mass index is 21.7 kg/m.  Interventions:  Diet: Carb modified diet DVT Prophylaxis: Therapeutic Anticoagulation with Eliquis    Advance goals of care discussion: Full code  Family Communication: family was not present at bedside, at the time of interview.  The pt provided permission to discuss medical plan with the family. Opportunity was given to ask question and all questions were answered satisfactorily.   Disposition:  Pt is from SNF, admitted with AKI and COVID, creatinine improving on IV fluid.  Patient has constipation, started laxatives.  Stable to discharge but cannot go to SNF until we have a BM. TOC following for discharge planning,  most likely DC tomorrow a.m.   Subjective: No significant events overnight, patient was uncomfortable due to position in the bed, RN was advised to reposition.  Patient was grumpy due to unknown reason, possible due to constipation.  Patient denied any specific complaints.  Physical Exam: General: NAD, lying comfortably Appear in no  distress, affect appropriate Eyes: PERRLA ENT: Oral Mucosa Clear, moist  Neck: no JVD,  Cardiovascular: S1 and S2 Present, no Murmur,  Respiratory: good respiratory effort, Bilateral Air entry equal and Decreased, no Crackles, no wheezes Abdomen: Bowel Sound present, Soft and no tenderness,  Skin: no rashes Extremities: no Pedal edema, no calf tenderness Neurologic: without any new focal findings Gait not checked due to patient safety concerns  Vitals:   06/15/23 0406 06/15/23 0408 06/15/23 0757 06/15/23 1546  BP:  (!) 130/91 (!) 141/84 (!) 145/92  Pulse:  76 92 89  Resp:  18 18 16   Temp:  98.7 F (37.1 C) 98.2 F (36.8 C) 99.5 F (37.5 C)  TempSrc:  Oral  Oral  SpO2:  90% 98% 97%  Weight: 74.6 kg     Height:        Intake/Output Summary (Last 24 hours) at 06/15/2023 1635 Last data filed at 06/15/2023 0406 Gross per 24 hour  Intake --  Output 600 ml  Net -600 ml   Filed Weights   06/13/23 0410 06/14/23 0453 06/15/23 0406  Weight: 71.7 kg 77.8 kg 74.6 kg    Data Reviewed: I have personally reviewed and interpreted daily labs, tele strips, imagings as discussed above. I reviewed all nursing notes, pharmacy notes, vitals, pertinent old records I have discussed plan of care as described above with RN and patient/family.  CBC: Recent Labs  Lab 06/12/23 1131 06/13/23 0427 06/14/23 0504 06/15/23 0612  WBC 5.4 5.4 5.7 7.6  NEUTROABS 3.3  --   --   --   HGB 14.4 12.9* 13.1 13.0  HCT 44.5 38.5* 40.1 38.5*  MCV 92.1 89.7 91.3 88.9  PLT 145* 143* 129* 133*   Basic Metabolic Panel: Recent Labs  Lab 06/12/23 1131 06/12/23 1422 06/13/23 0427 06/14/23 0504 06/15/23 0612  NA 132*  --  134* 136 135  K 3.1* 3.2* 3.7 4.2 4.5  CL 90*  --  100 103 105  CO2 27  --  25 26 23   GLUCOSE 144*  --  138* 148* 121*  BUN 95*  --  88* 69* 58*  CREATININE 2.92*  --  2.54* 2.31* 1.94*  CALCIUM 9.1  --  8.7* 9.0 8.9  MG  --  3.1*  --  3.0* 2.4  PHOS  --   --  3.3 3.0 2.7     Studies: No results found.  Scheduled Meds:  allopurinol  100 mg Oral Daily   apixaban  2.5 mg Oral BID   bisacodyl  10 mg Oral BID   Followed by   Melene Muller ON 06/16/2023] bisacodyl  10 mg Oral QHS   bisacodyl  10 mg Rectal Once   Chlorhexidine Gluconate Cloth  6 each Topical Q0600   finasteride  5 mg Oral Daily   guaiFENesin  600 mg Oral BID   metoprolol succinate  50 mg Oral Daily   mupirocin ointment  1 Application Nasal BID   polyethylene glycol  17 g Oral BID   rosuvastatin  20 mg Oral Daily   tamsulosin  0.4 mg Oral QPC supper   Continuous Infusions:   PRN Meds: acetaminophen **OR** acetaminophen, albuterol, [START  ON 06/16/2023] bisacodyl, chlorpheniramine-HYDROcodone, hydrALAZINE **OR** hydrALAZINE, ondansetron **OR** ondansetron (ZOFRAN) IV, QUEtiapine, traMADol, traZODone  Time spent: 35 minutes  Author: Gillis Santa. MD [facility] 06/15/2023 4:35 PM  To reach On-call, see care teams to locate the attending and reach out to them via [email or web address]. If 7PM-7AM, please contact night-coverage If you still have difficulty reaching the attending provider, please page the [facility] (Director on Call) for [facility] on amion for assistance.

## 2023-06-16 DIAGNOSIS — U071 COVID-19: Secondary | ICD-10-CM | POA: Diagnosis not present

## 2023-06-16 LAB — CBC
HCT: 38.7 % — ABNORMAL LOW (ref 39.0–52.0)
Hemoglobin: 13.1 g/dL (ref 13.0–17.0)
MCH: 29.8 pg (ref 26.0–34.0)
MCHC: 33.9 g/dL (ref 30.0–36.0)
MCV: 88.2 fL (ref 80.0–100.0)
Platelets: 146 10*3/uL — ABNORMAL LOW (ref 150–400)
RBC: 4.39 MIL/uL (ref 4.22–5.81)
RDW: 13.9 % (ref 11.5–15.5)
WBC: 9.7 10*3/uL (ref 4.0–10.5)
nRBC: 0 % (ref 0.0–0.2)

## 2023-06-16 LAB — BASIC METABOLIC PANEL
Anion gap: 10 (ref 5–15)
BUN: 52 mg/dL — ABNORMAL HIGH (ref 8–23)
CO2: 20 mmol/L — ABNORMAL LOW (ref 22–32)
Calcium: 9.2 mg/dL (ref 8.9–10.3)
Chloride: 105 mmol/L (ref 98–111)
Creatinine, Ser: 1.89 mg/dL — ABNORMAL HIGH (ref 0.61–1.24)
GFR, Estimated: 35 mL/min — ABNORMAL LOW (ref >60)
Glucose, Bld: 120 mg/dL — ABNORMAL HIGH (ref 70–99)
Potassium: 4.4 mmol/L (ref 3.5–5.1)
Sodium: 135 mmol/L (ref 135–145)

## 2023-06-16 LAB — PHOSPHORUS: Phosphorus: 2.9 mg/dL (ref 2.5–4.6)

## 2023-06-16 LAB — MAGNESIUM: Magnesium: 2.4 mg/dL (ref 1.7–2.4)

## 2023-06-16 MED ORDER — DICLOFENAC SODIUM 1 % EX GEL
2.0000 g | Freq: Four times a day (QID) | CUTANEOUS | Status: DC
  Administered 2023-06-16 (×2): 2 g via TOPICAL
  Filled 2023-06-16: qty 100

## 2023-06-16 NOTE — Plan of Care (Signed)
Report has been called to [facility] to [place], [place]. The patient will be picked up by a member of the [facility] program. Family has been informed of the discharged as well. Education has been provided to [facility] nurse.  Problem: Education: Goal: Knowledge of General Education information will improve Description: Including pain rating scale, medication(s)/side effects and non-pharmacologic comfort measures Outcome: Completed/Met   Problem: Health Behavior/Discharge Planning: Goal: Ability to manage health-related needs will improve Outcome: Completed/Met   Problem: Clinical Measurements: Goal: Ability to maintain clinical measurements within normal limits will improve Outcome: Completed/Met Goal: Will remain free from infection Outcome: Completed/Met Goal: Diagnostic test results will improve Outcome: Completed/Met Goal: Respiratory complications will improve Outcome: Completed/Met Goal: Cardiovascular complication will be avoided Outcome: Completed/Met   Problem: Activity: Goal: Risk for activity intolerance will decrease Outcome: Completed/Met   Problem: Nutrition: Goal: Adequate nutrition will be maintained Outcome: Completed/Met   Problem: Coping: Goal: Level of anxiety will decrease Outcome: Completed/Met   Problem: Elimination: Goal: Will not experience complications related to bowel motility Outcome: Completed/Met Goal: Will not experience complications related to urinary retention Outcome: Completed/Met   Problem: Pain Managment: Goal: General experience of comfort will improve Outcome: Completed/Met   Problem: Safety: Goal: Ability to remain free from injury will improve Outcome: Completed/Met   Problem: Skin Integrity: Goal: Risk for impaired skin integrity will decrease Outcome: Completed/Met   Problem: Education: Goal: Knowledge of risk factors and measures for prevention of condition will improve Outcome: Completed/Met

## 2023-06-16 NOTE — Plan of Care (Signed)
  Problem: Clinical Measurements: Goal: Cardiovascular complication will be avoided Outcome: Progressing   Problem: Activity: Goal: Risk for activity intolerance will decrease Outcome: Progressing   Problem: Nutrition: Goal: Adequate nutrition will be maintained Outcome: Progressing   Problem: Coping: Goal: Level of anxiety will decrease Outcome: Progressing   Problem: Education: Goal: Knowledge of risk factors and measures for prevention of condition will improve Outcome: Not Progressing

## 2023-06-16 NOTE — TOC Transition Note (Signed)
Transition of Care [facility]) - CM/SW Discharge Note   Patient Details  Name: George Bolton MRN: [id number] Date of Birth: 1941-04-20  [facility] CM/SW Contact:  Margarito Liner, LCSW Phone Number: 06/16/2023, 1:06 PM   Clinical Narrative:   Patient has orders to discharge back to [facility] today. RN will call report to [phone] (Room 218A). [facility] will pick him up at 1:15 and take him to their office before transporting him to the SNF. Patient is on airborne isolation precautions. CSW tried calling him in the room to notify but no answer. CSW called wife but no answer. CSW called and notified son. No further concerns. CSW signing off.  Final next level of care: Skilled Nursing Facility Barriers to Discharge: Barriers Resolved   Patient Goals and CMS Choice      Discharge Placement     Existing PASRR number confirmed : 06/16/23          Patient chooses bed at: [facility] Patient to be transferred to facility by: [facility] Name of family member notified: [name] Patient and family notified of of transfer: 06/16/23  Discharge Plan and Services Additional resources added to the After Visit Summary for                                       Social Determinants of Health (SDOH) Interventions SDOH Screenings   Food Insecurity: No Food Insecurity (06/12/2023)  Housing: Low Risk  (06/12/2023)  Transportation Needs: No Transportation Needs (06/12/2023)  Utilities: Not At Risk (06/12/2023)  Tobacco Use: Low Risk  (06/12/2023)  Recent Concern: Tobacco Use - Medium Risk (05/04/2023)   Received from [facility]     Readmission Risk Interventions    09/20/2022   10:22 AM  Readmission Risk Prevention Plan  Post Dischage Appt Complete  Medication Screening Complete  Transportation Screening Complete

## 2023-06-16 NOTE — Progress Notes (Addendum)
MD notified: Lorain Childes, this patient refused all of his medications this morning. He was just cursing this morning at staff (nurse and nurse tech) trying to provide breakfast and morning meds. Reports we are trying to kill him with all of the medications we are trying to give him. He had a bowel movement yesterday. refusing vitals as well and food.

## 2023-06-16 NOTE — Discharge Summary (Signed)
Physician Discharge Summary  George Bolton RUE:[id number] DOB: Dec 27, 1940 DOA: 06/12/2023  PCP: Unice Bailey, MD  Admit date: 06/12/2023 Discharge date: 06/16/2023  Admitted From: home Disposition:  SNF  Recommendations for Outpatient Follow-up:  Follow up with PCP in 1-2 weeks   Home Health:  no Equipment/Devices:  Discharge Condition: stable  CODE STATUS: fi;;  Diet recommendation: Heart Healthy / Carb Modified  Brief/Interim Summary: HPI was taken from Dr. Maisie Fus: George Bolton is a 82 y.o. male with medical history significant of  diastolic CHF, chronic atrial fibrillation on Eliquis, gout, stage IV CKD, hypertension and history of embolic stroke, who resides in NH and has interim history of recent dx of covid 8/14 after sick contact.  Per staff patient has had poor po intake over the last few days and has gotten progressively weak. Patient notes no fever no chills no sob but has developed a cough over the last few days. He also notes generalized malaise, loss of appetite and decrease oral intake.    ED Course:  Vitals : 132/97, hr 82, rr 14  sat 97%  Labs   Wbc 5.4, hgb 14.4,  plt 145 Na 132, K 3.1, cl 90, glu 144, cr 2.92 ( 1.85) Respiratory panel + covid EKG: Afib nonspecific t wave changes   Cxr IMPRESSION: No evidence of acute cardiopulmonary disease.   Tx 500cc ns  Discharge Diagnoses:  Principal Problem:   COVID  As per Dr. Lucianne Muss:  AKI secondary to decreased p.o. intake due to COVID History of CKDIV, baseline creatinine 1.85, creatinine 2.92 on admission S/p NS bolus 500 mL given in the ED Started IV fluid for hydration Bladder scan ruled out urinary retention US Renal: No acute findings. No hydronephrosis. Increased cortical echogenicity of the kidneys compatible with chronic medical renal disease. Bilateral kidney cysts. Avoid nephrotoxic medications, use renally dose medications Monitor renal function and urine output daily Cr  2.92--->2.31--1.94 gradually improving   COVID viral infection without any respiratory symptoms Presented with low oral intake Continue to monitor for symptoms Continue supportive care    Hypokalemia: WNL today    Hypovolemic hyponatremia, resolved - gently ivfs  -monitor labs    Generalized Weakness -due to acute illness -PT/OT ordered   HTN, Diastolic CHF and HLD -no acute exacerbation   continue Toprol-XL, statin, imdur, hydralazine     Atrial fibrillation  -rate controlled  -continue on metoprolol/ Eliquis    Gout - no current flare  -resumed allopurinol    Hx of Embolic CVA -on Eliquis for ppx    BPH, resumed Proscar Started Flomax   NIDDM T2, hemoglobin A1c 6.9, mild hyperglycemia Continue diabetic diet, follow-up with PCP    Constipation: resolved. Continue w/ bowel regimen   Discharge Instructions  Discharge Instructions     Call MD for:  difficulty breathing, headache or visual disturbances   Complete by: As directed    Call MD for:  extreme fatigue   Complete by: As directed    Call MD for:  persistant dizziness or light-headedness   Complete by: As directed    Call MD for:  persistant nausea and vomiting   Complete by: As directed    Call MD for:  severe uncontrolled pain   Complete by: As directed    Call MD for:  temperature >100.4   Complete by: As directed    Diet - low sodium heart healthy   Complete by: As directed    Diet Carb Modified   Complete  by: As directed    Discharge instructions   Complete by: As directed    Follow-up with PCP, patient to be seen by an MD in 1 to 2 days, continue monitor BP and titrate medication accordingly.  Decreased Imdur with holding parameters and discontinued Lasix, changed hydralazine to as needed. Repeat BMP after 1 week to check renal functions.  Continue adequate oral hydration 1.5 L/day.   Discharge instructions   Complete by: As directed    F/u w/ PCP w/in 1 week   Increase activity slowly    Complete by: As directed    Increase activity slowly   Complete by: As directed       Allergies as of 06/16/2023       Reactions   Cortisone Anaphylaxis   Triamcinolone    Other reaction(s): Unknown        Medication List     STOP taking these medications    cloNIDine 0.1 mg/24hr patch Commonly known as: CATAPRES - Dosed in mg/24 hr   furosemide 40 MG tablet Commonly known as: LASIX       TAKE these medications    acetaminophen 325 MG tablet Commonly known as: TYLENOL Take 2 tablets (650 mg total) by mouth every 6 (six) hours as needed for mild pain (or Fever >/= 101). What changed: when to take this   allopurinol 100 MG tablet Commonly known as: ZYLOPRIM Take 1 tablet (100 mg total) by mouth daily. Home med.   apixaban 2.5 MG Tabs tablet Commonly known as: ELIQUIS Take 2.5 mg by mouth 2 (two) times daily.   bisacodyl 5 MG EC tablet Commonly known as: DULCOLAX Take 2 tablets (10 mg total) by mouth at bedtime. Skip the dose if no constipation   fexofenadine 180 MG tablet Commonly known as: ALLEGRA Take 180 mg by mouth daily.   finasteride 5 MG tablet Commonly known as: PROSCAR Take 1 tablet (5 mg total) by mouth daily.   guaiFENesin 600 MG 12 hr tablet Commonly known as: MUCINEX Take 1 tablet (600 mg total) by mouth 2 (two) times daily for 5 days.   hydrALAZINE 100 MG tablet Commonly known as: APRESOLINE Take 0.5 tablets (50 mg total) by mouth 3 (three) times daily as needed (Systolic blood pressure greater than 160 mmHg). What changed:  how much to take when to take this reasons to take this   isosorbide mononitrate 30 MG 24 hr tablet Commonly known as: IMDUR Take 1 tablet (30 mg total) by mouth daily. Hold if SBP <130 mmHg What changed:  when to take this additional instructions   Jardiance 10 MG Tabs tablet Generic drug: empagliflozin Take 10 mg by mouth daily.   metoprolol succinate 100 MG 24 hr tablet Commonly known as:  TOPROL-XL Take 1 tablet (100 mg total) by mouth every evening. Take with or immediately following a meal.  Home med.   nystatin powder Commonly known as: MYCOSTATIN/NYSTOP Apply 1 Application topically 2 (two) times daily.   polyethylene glycol 17 g packet Commonly known as: MIRALAX / GLYCOLAX Take 17 g by mouth 2 (two) times daily. Skip the dose if no constipation   rosuvastatin 20 MG tablet Commonly known as: Crestor Take 1 tablet (20 mg total) by mouth daily.   tamsulosin 0.4 MG Caps capsule Commonly known as: FLOMAX Take 1 capsule (0.4 mg total) by mouth daily after supper.   traMADol 50 MG tablet Commonly known as: ULTRAM Take 1 tablet (50 mg total) by mouth 3 (three) times  daily as needed. What changed: when to take this   traZODone 50 MG tablet Commonly known as: DESYREL Take 0.5 tablets (25 mg total) by mouth at bedtime as needed for sleep.        Contact information for after-discharge care     Destination     [facility] .   Service: Skilled Nursing Contact information: [address] [place] [address] [phone]                    Allergies  Allergen Reactions   Cortisone Anaphylaxis   Triamcinolone     Other reaction(s): Unknown    Consultations:   Procedures/Studies: US RENAL  Result Date: 06/13/2023 CLINICAL DATA:  Acute kidney injury. EXAM: RENAL / URINARY TRACT ULTRASOUND COMPLETE COMPARISON:  11/22/2021 FINDINGS: Right Kidney: Renal measurements: 10.4 x 5.9 x 6.5 cm. = volume: 208.6 mL. Increased cortical echogenicity. No hydronephrosis. There are multiple kidney cysts. The largest of these is in the upper pole measuring 6.5 x 4.8 x 5.5 cm Left Kidney: Renal measurements: 12.9 x 6.9 x 6.0 cm. = volume: 277.03 mL. Increased cortical echogenicity. No hydronephrosis. Numerous left kidney cysts are again noted. The largest measures 5.1 by 5.0 x 4.4 cm. Bladder: Bladder appears distended.  No focal  bladder abnormality. Other: None. IMPRESSION: 1. No acute findings. No hydronephrosis. 2. Increased cortical echogenicity of the kidneys compatible with chronic medical renal disease. 3. Bilateral kidney cysts. Electronically Signed   By: Signa Kell M.D.   On: 06/13/2023 16:31   DG Chest 2 View  Result Date: 06/12/2023 CLINICAL DATA:  Cough.  COVID positive. EXAM: CHEST - 2 VIEW COMPARISON:  Chest radiographs 04/25/2023. FINDINGS: No consolidation or pulmonary edema. Stable cardiac and mediastinal contours. No pleural effusion or pneumothorax. Visualized bones and upper abdomen are unremarkable. IMPRESSION: No evidence of acute cardiopulmonary disease. Electronically Signed   By: Orvan Falconer M.D.   On: 06/12/2023 11:44   (Echo, Carotid, EGD, Colonoscopy, ERCP)    Subjective: Pt denies any complaints but wants to leave the hospital.   Discharge Exam: Vitals:   06/15/23 2230 06/16/23 0519  BP: (!) 128/93 137/82  Pulse: 99 84  Resp:    Temp:  99.4 F (37.4 C)  SpO2:  100%   Vitals:   06/15/23 1957 06/15/23 2230 06/16/23 0500 06/16/23 0519  BP: (!) 176/86 (!) 128/93  137/82  Pulse: 94 99  84  Resp: 18     Temp: 97.7 F (36.5 C)   99.4 F (37.4 C)  TempSrc: Oral   Oral  SpO2: 94%   100%  Weight:   73.5 kg   Height:        General: Pt is alert, awake, not in acute distress Cardiovascular: S1/S2 +, no rubs, no gallops Respiratory: decreased breath sounds b/l  Abdominal: Soft, NT, ND, bowel sounds + Extremities: no edema, no cyanosis    The results of significant diagnostics from this hospitalization (including imaging, microbiology, ancillary and laboratory) are listed below for reference.     Microbiology: Recent Results (from the past 240 hour(s))  SARS Coronavirus 2 by RT PCR (hospital order, performed in [facility] hospital lab) *cepheid single result test* Anterior Nasal Swab     Status: Abnormal   Collection Time: 06/12/23 11:31 AM   Specimen: Anterior Nasal  Swab  Result Value Ref Range Status   SARS Coronavirus 2 by RT PCR POSITIVE (A) NEGATIVE Final    Comment: (NOTE) SARS-CoV-2  target nucleic acids are DETECTED  SARS-CoV-2 RNA is generally detectable in upper respiratory specimens  during the acute phase of infection.  Positive results are indicative  of the presence of the identified virus, but do not rule out bacterial infection or co-infection with other pathogens not detected by the test.  Clinical correlation with patient history and  other diagnostic information is necessary to determine patient infection status.  The expected result is negative.  Fact Sheet for Patients:   RoadLapTop.co.za   Fact Sheet for Healthcare Providers:   http://kim-miller.com/    This test is not yet approved or cleared by the [place] FDA and  has been authorized for detection and/or diagnosis of SARS-CoV-2 by FDA under an Emergency Use Authorization (EUA).  This EUA will remain in effect (meaning this test can be used) for the duration of  the COVID-19 declaration under Section 564(b)(1)  of the Act, 21 U.S.C. section 360-bbb-3(b)(1), unless the authorization is terminated or revoked sooner.   Performed at [facility], [address] [place], [place] [address]   MRSA Next Gen by PCR, Nasal     Status: Abnormal   Collection Time: 06/12/23  3:55 PM   Specimen: Nasal Mucosa; Nasal Swab  Result Value Ref Range Status   MRSA by PCR Next Gen DETECTED (A) NOT DETECTED Final    Comment: RESULT CALLED TO, READ BACK BY AND VERIFIED WITH:  BETH RICHARDSON 06/12/2023 1835 CP (NOTE) The GeneXpert MRSA Assay (FDA approved for NASAL specimens only), is one component of a comprehensive MRSA colonization surveillance program. It is not intended to diagnose MRSA infection nor to guide or monitor treatment for MRSA infections. Test performance is not FDA approved in patients less than 53  years old. Performed at [facility], [address] [place], [place] [address]      Labs: BNP (last 3 results) Recent Labs    07/16/22 0419 09/18/22 1441 04/25/23 2024  BNP 1,138.3* 1,322.0* 924.7*   Basic Metabolic Panel: Recent Labs  Lab 06/12/23 1131 06/12/23 1422 06/13/23 0427 06/14/23 0504 06/15/23 0612 06/16/23 0422  NA 132*  --  134* 136 135 135  K 3.1* 3.2* 3.7 4.2 4.5 4.4  CL 90*  --  100 103 105 105  CO2 27  --  25 26 23  20*  GLUCOSE 144*  --  138* 148* 121* 120*  BUN 95*  --  88* 69* 58* 52*  CREATININE 2.92*  --  2.54* 2.31* 1.94* 1.89*  CALCIUM 9.1  --  8.7* 9.0 8.9 9.2  MG  --  3.1*  --  3.0* 2.4 2.4  PHOS  --   --  3.3 3.0 2.7 2.9   Liver Function Tests: Recent Labs  Lab 06/13/23 0427  AST 32  ALT 24  ALKPHOS 50  BILITOT 0.7  PROT 6.5  ALBUMIN 3.3*   No results for input(s): "LIPASE", "AMYLASE" in the last 168 hours. No results for input(s): "AMMONIA" in the last 168 hours. CBC: Recent Labs  Lab 06/12/23 1131 06/13/23 0427 06/14/23 0504 06/15/23 0612 06/16/23 0422  WBC 5.4 5.4 5.7 7.6 9.7  NEUTROABS 3.3  --   --   --   --   HGB 14.4 12.9* 13.1 13.0 13.1  HCT 44.5 38.5* 40.1 38.5* 38.7*  MCV 92.1 89.7 91.3 88.9 88.2  PLT 145* 143* 129* 133* 146*   Cardiac Enzymes: No results for input(s): "CKTOTAL", "CKMB", "CKMBINDEX", "TROPONINI" in the last 168 hours. BNP: Invalid input(s): "POCBNP"  CBG: No results for input(s): "GLUCAP" in the last 168 hours. D-Dimer No results for input(s): "DDIMER" in the last 72 hours. Hgb A1c No results for input(s): "HGBA1C" in the last 72 hours. Lipid Profile No results for input(s): "CHOL", "HDL", "LDLCALC", "TRIG", "CHOLHDL", "LDLDIRECT" in the last 72 hours. Thyroid function studies No results for input(s): "TSH", "T4TOTAL", "T3FREE", "THYROIDAB" in the last 72 hours.  Invalid input(s): "FREET3" Anemia work up No results for input(s): "VITAMINB12", "FOLATE", "FERRITIN", "TIBC",  "IRON", "RETICCTPCT" in the last 72 hours. Urinalysis    Component Value Date/Time   COLORURINE YELLOW (A) 04/25/2023 2126   APPEARANCEUR CLEAR (A) 04/25/2023 2126   APPEARANCEUR Clear 07/21/2018 0959   LABSPEC 1.011 04/25/2023 2126   LABSPEC 1.014 09/23/2012 1438   PHURINE 6.0 04/25/2023 2126   GLUCOSEU NEGATIVE 04/25/2023 2126   GLUCOSEU 50 mg/dL 29/56/2130 8657   HGBUR SMALL (A) 04/25/2023 2126   BILIRUBINUR NEGATIVE 04/25/2023 2126   BILIRUBINUR Negative 07/21/2018 0959   BILIRUBINUR Negative 09/23/2012 1438   KETONESUR NEGATIVE 04/25/2023 2126   PROTEINUR 100 (A) 04/25/2023 2126   NITRITE NEGATIVE 04/25/2023 2126   LEUKOCYTESUR LARGE (A) 04/25/2023 2126   LEUKOCYTESUR Negative 09/23/2012 1438   Sepsis Labs Recent Labs  Lab 06/13/23 0427 06/14/23 0504 06/15/23 0612 06/16/23 0422  WBC 5.4 5.7 7.6 9.7   Microbiology Recent Results (from the past 240 hour(s))  SARS Coronavirus 2 by RT PCR (hospital order, performed in [facility] hospital lab) *cepheid single result test* Anterior Nasal Swab     Status: Abnormal   Collection Time: 06/12/23 11:31 AM   Specimen: Anterior Nasal Swab  Result Value Ref Range Status   SARS Coronavirus 2 by RT PCR POSITIVE (A) NEGATIVE Final    Comment: (NOTE) SARS-CoV-2 target nucleic acids are DETECTED  SARS-CoV-2 RNA is generally detectable in upper respiratory specimens  during the acute phase of infection.  Positive results are indicative  of the presence of the identified virus, but do not rule out bacterial infection or co-infection with other pathogens not detected by the test.  Clinical correlation with patient history and  other diagnostic information is necessary to determine patient infection status.  The expected result is negative.  Fact Sheet for Patients:   RoadLapTop.co.za   Fact Sheet for Healthcare Providers:   http://kim-miller.com/    This test is not yet approved or  cleared by the [place] FDA and  has been authorized for detection and/or diagnosis of SARS-CoV-2 by FDA under an Emergency Use Authorization (EUA).  This EUA will remain in effect (meaning this test can be used) for the duration of  the COVID-19 declaration under Section 564(b)(1)  of the Act, 21 U.S.C. section 360-bbb-3(b)(1), unless the authorization is terminated or revoked sooner.   Performed at [facility], [address] [place], [place] [address]   MRSA Next Gen by PCR, Nasal     Status: Abnormal   Collection Time: 06/12/23  3:55 PM   Specimen: Nasal Mucosa; Nasal Swab  Result Value Ref Range Status   MRSA by PCR Next Gen DETECTED (A) NOT DETECTED Final    Comment: RESULT CALLED TO, READ BACK BY AND VERIFIED WITH:  BETH RICHARDSON 06/12/2023 1835 CP (NOTE) The GeneXpert MRSA Assay (FDA approved for NASAL specimens only), is one component of a comprehensive MRSA colonization surveillance program. It is not intended to diagnose MRSA infection nor to guide or monitor treatment for MRSA infections. Test performance is not FDA approved in patients less than  12 years old. Performed at [facility], [address] [place], [place] [address]      Time coordinating discharge: Over 30 minutes  SIGNED:   Charise Killian, MD  [facility] 06/16/2023, 12:47 PM Pager   If 7PM-7AM, please contact night-coverage [email or web address]

## 2023-10-25 ENCOUNTER — Other Ambulatory Visit: Payer: Self-pay

## 2023-10-25 ENCOUNTER — Emergency Department
Admission: EM | Admit: 2023-10-25 | Discharge: 2023-10-25 | Disposition: A | Discharge status: Home / Self Care | Payer: Medicare (Managed Care) | Attending: Emergency Medicine | Admitting: Emergency Medicine

## 2023-10-25 DIAGNOSIS — R040 Epistaxis: Secondary | ICD-10-CM | POA: Diagnosis present

## 2023-10-25 DIAGNOSIS — Z7901 Long term (current) use of anticoagulants: Secondary | ICD-10-CM | POA: Insufficient documentation

## 2023-10-25 DIAGNOSIS — I10 Essential (primary) hypertension: Secondary | ICD-10-CM | POA: Insufficient documentation

## 2023-10-25 MED ORDER — SILVER NITRATE-POT NITRATE 75-25 % EX MISC
CUTANEOUS | Status: AC
  Filled 2023-10-25: qty 10

## 2023-10-25 MED ORDER — CEPHALEXIN 250 MG PO CAPS: 250.0000 mg | ORAL_CAPSULE | Freq: Two times a day (BID) | ORAL | 0 refills | Status: AC

## 2023-10-25 NOTE — ED Triage Notes (Signed)
BIB [facility] from [facility]. Bilateral nose bleed for 1.5 hr pta. No trauma or injury to nose. Pt on daily eliquis. Clamp placed on arrival and pt still actively bleeding. MD notified. Pt alert and oriented. Breathing unlabored with symmetric chest rise and fall.

## 2023-10-25 NOTE — ED Notes (Signed)
Pt brought in from [facility] per [facility]. Came in for nosebleed to left nare for 1 hr prior to arrival. NO trauma pt is on eliquis. EMS gave 2 sprays of afrin en route.

## 2023-10-25 NOTE — ED Triage Notes (Signed)
2 sprays afrin each nare pta with no improvement

## 2023-10-25 NOTE — Discharge Instructions (Signed)
Please call the number provided for ENT to arrange a follow-up appointment in approximately 5 to 7 days for recheck/reevaluation and sponge removal.  Please take your antibiotic as prescribed twice daily for the next 7 days.  Return to the emergency department for any significant bleeding or any other symptom personally concerning to yourself.

## 2023-10-25 NOTE — ED Notes (Signed)
[facility] Called for transport to [facility]

## 2023-10-25 NOTE — ED Provider Notes (Signed)
[facility] Provider Note    None    (approximate)  History   Chief Complaint: Epistaxis  HPI  DAXEN SCOLLON is a 82 y.o. male with a past medical history of atrial fibrillation on Eliquis, hypertension, presents to the emergency department for nosebleed.  According to the patient approximately 1 hour prior to arrival patient began with epistaxis from the left nostril and going down the back of the throat into his mouth.  Clamp applied in triage but the patient continued to have bleeding so I saw the patient in the triage room.  Patient has no other complaints.  Physical Exam   Triage Vital Signs: ED Triage Vitals  Encounter Vitals Group     BP 10/25/23 0542 (!) 160/105     Systolic BP Percentile --      Diastolic BP Percentile --      Pulse Rate 10/25/23 0542 83     Resp 10/25/23 0542 20     Temp 10/25/23 0542 98.2 F (36.8 C)     Temp Source 10/25/23 0542 Oral     SpO2 10/25/23 0542 (!) 89 %     Weight 10/25/23 0535 160 lb (72.6 kg)     Height 10/25/23 0535 5\' 11"  (1.803 m)     Head Circumference --      Peak Flow --      Pain Score 10/25/23 0535 6     Pain Loc --      Pain Education --      Exclude from Growth Chart --     Most recent vital signs: Vitals:   10/25/23 0542  BP: (!) 160/105  Pulse: 83  Resp: 20  Temp: 98.2 F (36.8 C)  SpO2: (!) 89%    General: Awake, no distress.  CV:  Good peripheral perfusion.  Regular rate and rhythm  Resp:  Normal effort.  Equal breath sounds bilaterally.  Abd:  No distention.   Other:  Patient has epistaxis which appears to be coming from the left nostril currently hemostatic but does have dried blood within the mouth and left nostril with significant clotting in the left nostril.   ED Results / Procedures / Treatments   MEDICATIONS ORDERED IN ED: Medications  silver nitrate applicators 75-25 % applicator (has no administration in time range)     IMPRESSION / MDM / ASSESSMENT AND PLAN /  ED COURSE  I reviewed the triage vital signs and the nursing notes.  Patient's presentation is most consistent with acute presentation with potential threat to life or bodily function.  Patient presents to the emergency department for epistaxis on Eliquis.  Mildly hypertensive 160/105.  I have seen and evaluated the patient he has significant clot in the left nostril with dried blood in the mouth but I do not appreciate any active bleeding.  I was able to have the patient blow out a very large amount of clot from the left nose.  Do not appreciate any obvious anterior septal bleed.  Remains hemostatic currently.  Applied Afrin to both nostrils and nasal clamp.  We will reassess after 15 minutes.  Patient was initially hemostatic within had a very mild bleed much less so than earlier.  Applied Merocel sponge left nostril no further bleeding appreciated at this time.  We will monitor a short duration in the emergency department.  As long as patient continues to have no bleeding we will discharge on Keflex 250 twice daily and have the patient follow-up with  ENT in 1 week for splint removal.  Provided my typical epistaxis return precautions.  Patient is agreeable.  FINAL CLINICAL IMPRESSION(S) / ED DIAGNOSES   Epistaxis   Note:  This document was prepared using Dragon voice recognition software and may include unintentional dictation errors.   Minna Antis, MD 10/25/23 [phone]

## 2023-11-25 NOTE — Progress Notes (Deleted)
 11/26/2023 2:56 PM   George Bolton 29-Jan-1941 [id number]  Urological history 1. BPH with LU TS -Finasteride 5 mg daily  2. Renal cysts -RUS (10/2021) - multiple cysts in the right kidney largest measuring 5.4 x 5 x 5.6 cm in the upper pole - multiple cysts of varying sizes largest measuring 5.7 x 4.9 x 5.1 cm  3. Hydroceles -scrotal ultrasound (10/2021) - Large left hydrocele - left epididymal cysts   4.  Nephrolithiasis -CT urogram (07/2019) - Multiple bilateral kidney stones without hydronephrosis.   No chief complaint on file.    HPI: George Bolton is a 83 y.o. male who presents today for yearly visit with his son, Deandre.    Previous records reviewed.   Was admitted over the summer for COVID and started on tamsulosin as well.  I PSS ***  PVR ***   Score:  1-7 Mild 8-19 Moderate 20-35 Severe    PMH: Past Medical History:  Diagnosis Date   Acute embolic stroke (HCC) 11/26/2021   Chronic atrial fibrillation (HCC)    a. Afib dates back to at least 2002 when reviewing prior EKG with EKGs from 2014 onward showing Afib; b. CHADS2VASc at least 4 (CHF, HTN, age x 2)   Chronic diastolic CHF (congestive heart failure) (HCC)    a. TTE 5/19: EF 70-75%, mod LVH, near complete obliteration of the mid and apical LV cavity (can be seen in apical hypertrophic CM), mildly dilated LA, RV cavity size nl, RV wall thickness nl, RVSF mildly reduced, mildly dilated RA; b. 02/2018 Limited echo w/ definity: EF 50-65%, no apical hypertrophic CM.   Community acquired bilateral lower lobe pneumonia 11/23/2021   Goals of care, counseling/discussion 12/11/2021   Gout    Hypertension    Hypertensive emergency 12/10/2021   Hypertensive urgency 07/16/2022    Surgical History: Past Surgical History:  Procedure Laterality Date   CHOLECYSTECTOMY N/A 01/13/2017   Procedure: LAPAROSCOPIC CHOLECYSTECTOMY;  Surgeon: Leafy Ro, MD;  Location: [facility] ORS;  Service: General;  Laterality:  N/A;    Home Medications:  Allergies as of 11/26/2023       Reactions   Cortisone Anaphylaxis   Triamcinolone    Other reaction(s): Unknown        Medication List        Accurate as of November 25, 2023  2:56 PM. If you have any questions, ask your nurse or doctor.          acetaminophen 325 MG tablet Commonly known as: TYLENOL Take 2 tablets (650 mg total) by mouth every 6 (six) hours as needed for mild pain (or Fever >/= 101). What changed: when to take this   allopurinol 100 MG tablet Commonly known as: ZYLOPRIM Take 1 tablet (100 mg total) by mouth daily. Home med.   apixaban 2.5 MG Tabs tablet Commonly known as: ELIQUIS Take 2.5 mg by mouth 2 (two) times daily.   bisacodyl 5 MG EC tablet Commonly known as: DULCOLAX Take 2 tablets (10 mg total) by mouth at bedtime. Skip the dose if no constipation   fexofenadine 180 MG tablet Commonly known as: ALLEGRA Take 180 mg by mouth daily.   finasteride 5 MG tablet Commonly known as: PROSCAR Take 1 tablet (5 mg total) by mouth daily.   hydrALAZINE 100 MG tablet Commonly known as: APRESOLINE Take 0.5 tablets (50 mg total) by mouth 3 (three) times daily as needed (Systolic blood pressure greater than 160 mmHg).   isosorbide mononitrate 30 MG  24 hr tablet Commonly known as: IMDUR Take 1 tablet (30 mg total) by mouth daily. Hold if SBP <130 mmHg   Jardiance 10 MG Tabs tablet Generic drug: empagliflozin Take 10 mg by mouth daily.   metoprolol succinate 100 MG 24 hr tablet Commonly known as: TOPROL-XL Take 1 tablet (100 mg total) by mouth every evening. Take with or immediately following a meal.  Home med.   nystatin powder Commonly known as: MYCOSTATIN/NYSTOP Apply 1 Application topically 2 (two) times daily.   polyethylene glycol 17 g packet Commonly known as: MIRALAX / GLYCOLAX Take 17 g by mouth 2 (two) times daily. Skip the dose if no constipation   rosuvastatin 20 MG tablet Commonly known as:  Crestor Take 1 tablet (20 mg total) by mouth daily.   tamsulosin 0.4 MG Caps capsule Commonly known as: FLOMAX Take 1 capsule (0.4 mg total) by mouth daily after supper.   traMADol 50 MG tablet Commonly known as: ULTRAM Take 1 tablet (50 mg total) by mouth 3 (three) times daily as needed.   traZODone 50 MG tablet Commonly known as: DESYREL Take 0.5 tablets (25 mg total) by mouth at bedtime as needed for sleep.        Allergies:  Allergies  Allergen Reactions   Cortisone Anaphylaxis   Triamcinolone     Other reaction(s): Unknown    Family History: Family History  Problem Relation Age of Onset   Stroke Mother     Social History:   reports that he has never smoked. He has never used smokeless tobacco. He reports that he does not drink alcohol and does not use drugs.  Physical Exam: There were no vitals taken for this visit.  Constitutional:  Well nourished. Alert and oriented, No acute distress. HEENT: [place] AT, moist mucus membranes.  Trachea midline, no masses. Cardiovascular: No clubbing, cyanosis, or edema. Respiratory: Normal respiratory effort, no increased work of breathing. GI: Abdomen is soft, non tender, non distended, no abdominal masses. Liver and spleen not palpable.  No hernias appreciated.  Stool sample for occult testing is not indicated.   GU: No CVA tenderness.  No bladder fullness or masses.  Patient with circumcised/uncircumcised phallus. ***Foreskin easily retracted***  Urethral meatus is patent.  No penile discharge. No penile lesions or rashes. Scrotum without lesions, cysts, rashes and/or edema.  Testicles are located scrotally bilaterally. No masses are appreciated in the testicles. Left and right epididymis are normal. Rectal: Patient with  normal sphincter tone. Anus and perineum without scarring or rashes. No rectal masses are appreciated. Prostate is approximately *** grams, *** nodules are appreciated. Seminal vesicles are normal. Skin: No rashes,  bruises or suspicious lesions. Lymph: No cervical or inguinal adenopathy. Neurologic: Grossly intact, no focal deficits, moving all 4 extremities. Psychiatric: Normal mood and affect.   Laboratory Data: CBC    Component Value Date/Time   WBC 9.7 06/16/2023 0422   RBC 4.39 06/16/2023 0422   HGB 13.1 06/16/2023 0422   HGB 14.8 10/22/2013 0517   HCT 38.7 (L) 06/16/2023 0422   HCT 43.2 10/22/2013 0517   PLT 146 (L) 06/16/2023 0422   PLT 142 (L) 10/22/2013 0517   MCV 88.2 06/16/2023 0422   MCV 83 10/22/2013 0517   MCH 29.8 06/16/2023 0422   MCHC 33.9 06/16/2023 0422   RDW 13.9 06/16/2023 0422   RDW 14.7 (H) 10/22/2013 0517   LYMPHSABS 1.2 06/12/2023 1131   LYMPHSABS 0.8 (L) 10/22/2013 0517   MONOABS 0.7 06/12/2023 1131   MONOABS  0.6 10/22/2013 0517   EOSABS 0.1 06/12/2023 1131   EOSABS 0.0 10/22/2013 0517   BASOSABS 0.0 06/12/2023 1131   BASOSABS 0.0 10/22/2013 0517    BMET    Component Value Date/Time   NA 135 06/16/2023 0422   NA 141 09/24/2020 1510   NA 137 10/22/2013 0517   K 4.4 06/16/2023 0422   K 4.0 10/22/2013 0517   CL 105 06/16/2023 0422   CL 105 10/22/2013 0517   CO2 20 (L) 06/16/2023 0422   CO2 25 10/22/2013 0517   GLUCOSE 120 (H) 06/16/2023 0422   GLUCOSE 144 (H) 10/22/2013 0517   BUN 52 (H) 06/16/2023 0422   BUN 33 (H) 09/24/2020 1510   BUN 24 (H) 10/22/2013 0517   CREATININE 1.89 (H) 06/16/2023 0422   CREATININE 1.47 (H) 10/22/2013 0517   CALCIUM 9.2 06/16/2023 0422   CALCIUM 8.6 10/22/2013 0517   GFRNONAA 35 (L) 06/16/2023 0422   GFRNONAA 46 (L) 10/22/2013 0517  I have reviewed the labs.   Pertinent imaging: ***  Assessment & Plan:    1. BPH with obstruction/lower urinary tract symptoms - PVR demonstrates adequate emptying -Continue finasteride 5 mg daily and tamsulosin 0.4 mg daily   2. Renal cyst -Stable, recommend no further monitoring for these.  3. Kidney stones -Asymptomatic,  4. Hydrocele, unspecified hydrocele  type -Stable  No follow-ups on file.  Cloretta Ned  [facility] [address] [place], [place] [address] [phone]

## 2023-11-26 ENCOUNTER — Ambulatory Visit: Payer: Medicare (Managed Care) | Admitting: Urology

## 2023-11-26 DIAGNOSIS — N138 Other obstructive and reflux uropathy: Secondary | ICD-10-CM

## 2023-11-26 DIAGNOSIS — N433 Hydrocele, unspecified: Secondary | ICD-10-CM

## 2023-11-26 DIAGNOSIS — N281 Cyst of kidney, acquired: Secondary | ICD-10-CM

## 2023-11-26 DIAGNOSIS — N200 Calculus of kidney: Secondary | ICD-10-CM

## 2023-12-01 NOTE — Progress Notes (Signed)
 12/03/2023 10:59 PM   George Bolton 10-Feb-1941 [id number]  Urological history 1. BPH with LU TS -Finasteride  5 mg daily  2. Renal cysts -RUS (10/2021) - multiple cysts in the right kidney largest measuring 5.4 x 5 x 5.6 cm in the upper pole - multiple cysts of varying sizes largest measuring 5.7 x 4.9 x 5.1 cm  3. Hydroceles -scrotal ultrasound (10/2021) - Large left hydrocele - left epididymal cysts   4.  Nephrolithiasis -CT urogram (07/2019) - Multiple bilateral kidney stones without hydronephrosis.   Chief Complaint  Patient presents with   Follow-up    HPI: George Bolton is a 83 y.o. male who presents today for yearly visit with his daughter.  Previous records reviewed.   Was admitted over the summer for COVID and started on tamsulosin  as well.  I PSS 11/2  PVR 388 mL   He has no complaints.  Patient denies any modifying or aggravating factors.  Patient denies any recent UTI's, gross hematuria, dysuria or suprapubic/flank pain.  Patient denies any fevers, chills, nausea or vomiting.     IPSS     Row Name 12/05/23 2200         International Prostate Symptom Score   How often have you had the sensation of not emptying your bladder? Not at All     How often have you had to urinate less than every two hours? About half the time     How often have you found you stopped and started again several times when you urinated? Not at All     How often have you found it difficult to postpone urination? About half the time     How often have you had a weak urinary stream? Less than half the time     How often have you had to strain to start urination? Not at All     How many times did you typically get up at night to urinate? 3 Times     Total IPSS Score 11       Quality of Life due to urinary symptoms   If you were to spend the rest of your life with your urinary condition just the way it is now how would you feel about that? Mostly Satisfied               Score:  1-7 Mild 8-19 Moderate 20-35 Severe   PMH: Past Medical History:  Diagnosis Date   Acute embolic stroke (HCC) 11/26/2021   Chronic atrial fibrillation (HCC)    a. Afib dates back to at least 2002 when reviewing prior EKG with EKGs from 2014 onward showing Afib; b. CHADS2VASc at least 4 (CHF, HTN, age x 2)   Chronic diastolic CHF (congestive heart failure) (HCC)    a. TTE 5/19: EF 70-75%, mod LVH, near complete obliteration of the mid and apical LV cavity (can be seen in apical hypertrophic CM), mildly dilated LA, RV cavity size nl, RV wall thickness nl, RVSF mildly reduced, mildly dilated RA; b. 02/2018 Limited echo w/ definity : EF 50-65%, no apical hypertrophic CM.   Community acquired bilateral lower lobe pneumonia 11/23/2021   Goals of care, counseling/discussion 12/11/2021   Gout    Hypertension    Hypertensive emergency 12/10/2021   Hypertensive urgency 07/16/2022    Surgical History: Past Surgical History:  Procedure Laterality Date   CHOLECYSTECTOMY N/A 01/13/2017   Procedure: LAPAROSCOPIC CHOLECYSTECTOMY;  Surgeon: Laneta JULIANNA Luna, MD;  Location: [facility] ORS;  Service:  General;  Laterality: N/A;    Home Medications:  Allergies as of 12/03/2023       Reactions   Cortisone Anaphylaxis   Triamcinolone    Other reaction(s): Unknown        Medication List        Accurate as of December 03, 2023 11:59 PM. If you have any questions, ask your nurse or doctor.          acetaminophen  325 MG tablet Commonly known as: TYLENOL  Take 2 tablets (650 mg total) by mouth every 6 (six) hours as needed for mild pain (or Fever >/= 101). What changed: when to take this   allopurinol  100 MG tablet Commonly known as: ZYLOPRIM  Take 1 tablet (100 mg total) by mouth daily. Home med.   apixaban  2.5 MG Tabs tablet Commonly known as: ELIQUIS  Take 2.5 mg by mouth 2 (two) times daily.   bisacodyl  5 MG EC tablet Commonly known as: DULCOLAX Take 2 tablets (10 mg total) by  mouth at bedtime. Skip the dose if no constipation   Catapres -TTS-1 0.1 MG/24HR patch Generic drug: cloNIDine  0.1 mg once a week.   fexofenadine 180 MG tablet Commonly known as: ALLEGRA Take 180 mg by mouth daily.   finasteride  5 MG tablet Commonly known as: PROSCAR  Take 1 tablet (5 mg total) by mouth daily.   hydrALAZINE  25 MG tablet Commonly known as: APRESOLINE  Take 25 mg by mouth 3 (three) times daily. What changed: Another medication with the same name was removed. Continue taking this medication, and follow the directions you see here.   isosorbide  mononitrate 30 MG 24 hr tablet Commonly known as: IMDUR  Take 1 tablet (30 mg total) by mouth daily. Hold if SBP <130 mmHg   Jardiance  10 MG Tabs tablet Generic drug: empagliflozin  Take 10 mg by mouth daily.   metoprolol  succinate 100 MG 24 hr tablet Commonly known as: TOPROL -XL Take 1 tablet (100 mg total) by mouth every evening. Take with or immediately following a meal.  Home med.   mirtazapine  7.5 MG tablet Commonly known as: REMERON  Take 7.5 mg by mouth at bedtime.   nystatin  powder Commonly known as: MYCOSTATIN /NYSTOP  Apply 1 Application topically 2 (two) times daily.   polyethylene glycol 17 g packet Commonly known as: MIRALAX  / GLYCOLAX  Take 17 g by mouth 2 (two) times daily. Skip the dose if no constipation   rosuvastatin  20 MG tablet Commonly known as: Crestor  Take 1 tablet (20 mg total) by mouth daily.   tamsulosin  0.4 MG Caps capsule Commonly known as: FLOMAX  Take 1 capsule (0.4 mg total) by mouth daily after supper.   traMADol  50 MG tablet Commonly known as: ULTRAM  Take 1 tablet (50 mg total) by mouth 3 (three) times daily as needed.   traZODone  50 MG tablet Commonly known as: DESYREL  Take 0.5 tablets (25 mg total) by mouth at bedtime as needed for sleep.        Allergies:  Allergies  Allergen Reactions   Cortisone Anaphylaxis   Triamcinolone     Other reaction(s): Unknown    Family  History: Family History  Problem Relation Age of Onset   Stroke Mother     Social History:   reports that he has never smoked. He has never used smokeless tobacco. He reports that he does not drink alcohol and does not use drugs.  Physical Exam: BP (!) 175/102   Pulse 85   Ht 6' 1 (1.854 m)   Wt 155 lb (70.3 kg)  BMI 20.45 kg/m   Constitutional:  Well nourished. Alert and oriented, No acute distress. HEENT: [place] AT, moist mucus membranes.  Trachea midline, no masses. Cardiovascular: No clubbing, cyanosis, or edema. Respiratory: Normal respiratory effort, no increased work of breathing. Neurologic: Grossly intact, no focal deficits, moving all 4 extremities. Psychiatric: Normal mood and affect.   Laboratory Data: CBC    Component Value Date/Time   WBC 9.7 06/16/2023 0422   RBC 4.39 06/16/2023 0422   HGB 13.1 06/16/2023 0422   HGB 14.8 10/22/2013 0517   HCT 38.7 (L) 06/16/2023 0422   HCT 43.2 10/22/2013 0517   PLT 146 (L) 06/16/2023 0422   PLT 142 (L) 10/22/2013 0517   MCV 88.2 06/16/2023 0422   MCV 83 10/22/2013 0517   MCH 29.8 06/16/2023 0422   MCHC 33.9 06/16/2023 0422   RDW 13.9 06/16/2023 0422   RDW 14.7 (H) 10/22/2013 0517   LYMPHSABS 1.2 06/12/2023 1131   LYMPHSABS 0.8 (L) 10/22/2013 0517   MONOABS 0.7 06/12/2023 1131   MONOABS 0.6 10/22/2013 0517   EOSABS 0.1 06/12/2023 1131   EOSABS 0.0 10/22/2013 0517   BASOSABS 0.0 06/12/2023 1131   BASOSABS 0.0 10/22/2013 0517    BMET    Component Value Date/Time   NA 135 06/16/2023 0422   NA 141 09/24/2020 1510   NA 137 10/22/2013 0517   K 4.4 06/16/2023 0422   K 4.0 10/22/2013 0517   CL 105 06/16/2023 0422   CL 105 10/22/2013 0517   CO2 20 (L) 06/16/2023 0422   CO2 25 10/22/2013 0517   GLUCOSE 120 (H) 06/16/2023 0422   GLUCOSE 144 (H) 10/22/2013 0517   BUN 52 (H) 06/16/2023 0422   BUN 33 (H) 09/24/2020 1510   BUN 24 (H) 10/22/2013 0517   CREATININE 1.89 (H) 06/16/2023 0422   CREATININE 1.47 (H)  10/22/2013 0517   CALCIUM  9.2 06/16/2023 0422   CALCIUM  8.6 10/22/2013 0517   GFRNONAA 35 (L) 06/16/2023 0422   GFRNONAA 46 (L) 10/22/2013 0517  I have reviewed the labs.   Pertinent imaging:  12/03/23 11:08  Scan Result 388 ml    Assessment & Plan:    1. BPH with obstruction/lower urinary tract symptoms - PVR with moderate residuals, but patient without bothersome symptoms  -serum creatinine stable  -Continue finasteride  5 mg daily and tamsulosin  0.4 mg daily   2. Renal cyst -Stable, recommend no further monitoring for these.  3. Kidney stones -Asymptomatic,  4. Hydrocele, unspecified hydrocele type -Stable  Return in about 1 year (around 12/02/2024) for IPSS and PVR.  CLOTILDA HELON RIGGERS  [facility] [address] [place], [place] [address] [phone]

## 2023-12-03 ENCOUNTER — Ambulatory Visit: Payer: Medicare (Managed Care) | Admitting: Urology

## 2023-12-03 VITALS — BP 175/102 | HR 85 | Ht 73.0 in | Wt 155.0 lb

## 2023-12-03 DIAGNOSIS — N433 Hydrocele, unspecified: Secondary | ICD-10-CM | POA: Diagnosis not present

## 2023-12-03 DIAGNOSIS — N281 Cyst of kidney, acquired: Secondary | ICD-10-CM | POA: Diagnosis not present

## 2023-12-03 DIAGNOSIS — N401 Enlarged prostate with lower urinary tract symptoms: Secondary | ICD-10-CM | POA: Diagnosis not present

## 2023-12-03 DIAGNOSIS — N138 Other obstructive and reflux uropathy: Secondary | ICD-10-CM

## 2023-12-03 DIAGNOSIS — N200 Calculus of kidney: Secondary | ICD-10-CM

## 2023-12-03 LAB — BLADDER SCAN AMB NON-IMAGING: Scan Result: 388

## 2023-12-05 ENCOUNTER — Encounter: Payer: Self-pay | Admitting: Urology

## 2023-12-16 ENCOUNTER — Other Ambulatory Visit: Payer: Self-pay

## 2023-12-16 ENCOUNTER — Emergency Department: Payer: Medicare (Managed Care)

## 2023-12-16 ENCOUNTER — Encounter: Payer: Self-pay | Admitting: Intensive Care

## 2023-12-16 ENCOUNTER — Emergency Department
Admission: EM | Admit: 2023-12-16 | Discharge: 2023-12-16 | Disposition: A | Discharge status: Home / Self Care | Payer: Medicare (Managed Care) | Attending: Emergency Medicine | Admitting: Emergency Medicine

## 2023-12-16 DIAGNOSIS — I130 Hypertensive heart and chronic kidney disease with heart failure and stage 1 through stage 4 chronic kidney disease, or unspecified chronic kidney disease: Secondary | ICD-10-CM | POA: Insufficient documentation

## 2023-12-16 DIAGNOSIS — K08109 Complete loss of teeth, unspecified cause, unspecified class: Secondary | ICD-10-CM | POA: Diagnosis not present

## 2023-12-16 DIAGNOSIS — M542 Cervicalgia: Secondary | ICD-10-CM | POA: Insufficient documentation

## 2023-12-16 DIAGNOSIS — Z8673 Personal history of transient ischemic attack (TIA), and cerebral infarction without residual deficits: Secondary | ICD-10-CM | POA: Diagnosis not present

## 2023-12-16 DIAGNOSIS — Z7901 Long term (current) use of anticoagulants: Secondary | ICD-10-CM | POA: Insufficient documentation

## 2023-12-16 DIAGNOSIS — I4891 Unspecified atrial fibrillation: Secondary | ICD-10-CM | POA: Diagnosis not present

## 2023-12-16 DIAGNOSIS — M25511 Pain in right shoulder: Secondary | ICD-10-CM | POA: Diagnosis not present

## 2023-12-16 DIAGNOSIS — I5030 Unspecified diastolic (congestive) heart failure: Secondary | ICD-10-CM | POA: Diagnosis not present

## 2023-12-16 DIAGNOSIS — N184 Chronic kidney disease, stage 4 (severe): Secondary | ICD-10-CM | POA: Insufficient documentation

## 2023-12-16 DIAGNOSIS — M25512 Pain in left shoulder: Secondary | ICD-10-CM | POA: Insufficient documentation

## 2023-12-16 LAB — CBC WITH DIFFERENTIAL/PLATELET
Abs Immature Granulocytes: 0.02 10*3/uL (ref 0.00–0.07)
Basophils Absolute: 0 10*3/uL (ref 0.0–0.1)
Basophils Relative: 1 %
Eosinophils Absolute: 0.2 10*3/uL (ref 0.0–0.5)
Eosinophils Relative: 3 %
HCT: 47.1 % (ref 39.0–52.0)
Hemoglobin: 14.4 g/dL (ref 13.0–17.0)
Immature Granulocytes: 0 %
Lymphocytes Relative: 15 %
Lymphs Abs: 1.1 10*3/uL (ref 0.7–4.0)
MCH: 29 pg (ref 26.0–34.0)
MCHC: 30.6 g/dL (ref 30.0–36.0)
MCV: 95 fL (ref 80.0–100.0)
Monocytes Absolute: 0.8 10*3/uL (ref 0.1–1.0)
Monocytes Relative: 10 %
Neutro Abs: 5.4 10*3/uL (ref 1.7–7.7)
Neutrophils Relative %: 71 %
Platelets: 197 10*3/uL (ref 150–400)
RBC: 4.96 MIL/uL (ref 4.22–5.81)
RDW: 16.5 % — ABNORMAL HIGH (ref 11.5–15.5)
WBC: 7.6 10*3/uL (ref 4.0–10.5)
nRBC: 0 % (ref 0.0–0.2)

## 2023-12-16 LAB — COMPREHENSIVE METABOLIC PANEL
ALT: 15 U/L (ref 0–44)
AST: 30 U/L (ref 15–41)
Albumin: 3.8 g/dL (ref 3.5–5.0)
Alkaline Phosphatase: 69 U/L (ref 38–126)
Anion gap: 16 — ABNORMAL HIGH (ref 5–15)
BUN: 43 mg/dL — ABNORMAL HIGH (ref 8–23)
CO2: 20 mmol/L — ABNORMAL LOW (ref 22–32)
Calcium: 9.6 mg/dL (ref 8.9–10.3)
Chloride: 107 mmol/L (ref 98–111)
Creatinine, Ser: 2.26 mg/dL — ABNORMAL HIGH (ref 0.61–1.24)
GFR, Estimated: 28 mL/min — ABNORMAL LOW (ref >60)
Glucose, Bld: 165 mg/dL — ABNORMAL HIGH (ref 70–99)
Potassium: 3.8 mmol/L (ref 3.5–5.1)
Sodium: 143 mmol/L (ref 135–145)
Total Bilirubin: 0.8 mg/dL (ref 0.0–1.2)
Total Protein: 7.5 g/dL (ref 6.5–8.1)

## 2023-12-16 MED ORDER — TRAMADOL HCL 50 MG PO TABS: 50.0000 mg | ORAL_TABLET | Freq: Two times a day (BID) | ORAL | 0 refills | Status: AC

## 2023-12-16 MED ORDER — TRAMADOL HCL 50 MG PO TABS: 50.0000 mg | ORAL_TABLET | Freq: Two times a day (BID) | ORAL | 0 refills | Status: DC

## 2023-12-16 MED ORDER — TRAMADOL HCL 50 MG PO TABS: 50.0000 mg | ORAL_TABLET | Freq: Once | ORAL | Status: DC

## 2023-12-16 NOTE — ED Notes (Signed)
This RN spoke with Devetta at [facility] and gave report on this pt. Pt needing transportation back to [facility].

## 2023-12-16 NOTE — ED Notes (Signed)
 Assisted pt to restroom

## 2023-12-16 NOTE — ED Triage Notes (Signed)
Patient arrived by EMS from [facility] for bilateral shoulder/neck pain. Patient also c/o his teeth falling out   Patient is crying in triage

## 2023-12-16 NOTE — ED Triage Notes (Addendum)
First nurse note: pt to ED [facility] from [facility], called EMS because "teeth are falling out" and c/o bilateral shoulder pain. Placed on 2 L [place] for SpO2 90%

## 2023-12-16 NOTE — Discharge Instructions (Signed)
Your exam, labs, and CT scan are overall normal and reassuring.  Your neck pain is probably due to arthritis in the neck.  Take the prescription pain medicine as needed.  Follow-up with your primary provider for ongoing evaluation.  Return to ED needed.

## 2023-12-16 NOTE — ED Provider Notes (Signed)
 [facility] Emergency Department Provider Note     Event Date/Time   First MD Initiated Contact with Patient 12/16/23 1451     (approximate)   History   Shoulder Pain   HPI  George Bolton is a 83 y.o. male with a  medical history significant for diastolic CHF, chronic atrial fibrillation on Eliquis, gout, stage IV CKD, hypertension and history of embolic stroke, presents to the ED for complaints of bilateral neck and shoulder pain.  Patient denies any recent injury, trauma, fall.  He would endorse pain to the posterior bilateral neck.  Patient is also reporting some complaints about some dental loss.  He again denies any preceding injury or trauma.  He still reports he is able to eat and drink as expected.  He denies any cough, congestion, chest pain, or shortness of breath.  Physical Exam   Triage Vital Signs: ED Triage Vitals  Encounter Vitals Group     BP 12/16/23 1217 (!) (P) 170/106     Systolic BP Percentile --      Diastolic BP Percentile --      Pulse Rate 12/16/23 1217 (P) 84     Resp 12/16/23 1217 (!) (P) 21     Temp 12/16/23 1217 (P) 98.2 F (36.8 C)     Temp Source 12/16/23 1217 (P) Oral     SpO2 12/16/23 1217 (P) 92 %     Weight 12/16/23 1222 160 lb (72.6 kg)     Height 12/16/23 1222 6\' 1"  (1.854 m)     Head Circumference --      Peak Flow --      Pain Score 12/16/23 1222 9     Pain Loc --      Pain Education --      Exclude from Growth Chart --     Most recent vital signs: Vitals:   12/16/23 1700 12/16/23 1740  BP: (!) 133/99   Pulse: 66   Resp:    Temp:  97.9 F (36.6 C)  SpO2: 95%     General Awake, no distress. NAD HEENT NCAT. PERRL. EOMI. No rhinorrhea. Mucous membranes are moist.  CV:  Good peripheral perfusion. RRR RESP:  Normal effort. CTA ABD:  No distention.  MSK:  Active range of motion of all extremities.  Neck is supple without midline tenderness, spasm, deformity, or step-off.  Patient with a mild  kyphotic deformity of the cervical and thoracic spine.   ED Results / Procedures / Treatments   Labs (all labs ordered are listed, but only abnormal results are displayed) Labs Reviewed  CBC WITH DIFFERENTIAL/PLATELET - Abnormal; Notable for the following components:      Result Value   RDW 16.5 (*)    All other components within normal limits  COMPREHENSIVE METABOLIC PANEL - Abnormal; Notable for the following components:   CO2 20 (*)    Glucose, Bld 165 (*)    BUN 43 (*)    Creatinine, Ser 2.26 (*)    GFR, Estimated 28 (*)    Anion gap 16 (*)    All other components within normal limits  URINALYSIS, ROUTINE W REFLEX MICROSCOPIC   EKG   RADIOLOGY  I personally viewed and evaluated these images as part of my medical decision making, as well as reviewing the written report by the radiologist.  ED Provider Interpretation: No acute findings  CT Cervical Spine Wo Contrast Result Date: 12/16/2023 CLINICAL DATA:  Cervical radiculopathy. EXAM: CT CERVICAL  SPINE WITHOUT CONTRAST TECHNIQUE: Multidetector CT imaging of the cervical spine was performed without intravenous contrast. Multiplanar CT image reconstructions were also generated. RADIATION DOSE REDUCTION: This exam was performed according to the departmental dose-optimization program which includes automated exposure control, adjustment of the mA and/or kV according to patient size and/or use of iterative reconstruction technique. COMPARISON:  None Available. FINDINGS: Alignment: No acute subluxation. There is reversal of normal cervical lordosis which may be positional or due to muscle spasm. Grade 1 C2-C3 and C4-C5 anterolisthesis. Skull base and vertebrae: No definite acute fracture. Evaluation however is very limited due to advanced osteopenia and degenerative changes. Soft tissues and spinal canal: No prevertebral fluid or swelling. No visible canal hematoma. Disc levels:  No acute findings.  Multilevel degenerative changes. Upper  chest: Negative. Other: None IMPRESSION: 1. No acute/traumatic cervical spine pathology. 2. Multilevel degenerative changes. Electronically Signed   By: Elgie Collard M.D.   On: 12/16/2023 16:46    PROCEDURES:  Critical Care performed: No  Procedures   MEDICATIONS ORDERED IN ED: Medications  traMADol (ULTRAM) tablet 50 mg (has no administration in time range)     IMPRESSION / MDM / ASSESSMENT AND PLAN / ED COURSE  I reviewed the triage vital signs and the nursing notes.                              Differential diagnosis includes, but is not limited to, cervical strain, cervical radiculopathy, DDD, myalgias  Patient's presentation is most consistent with acute complicated illness / injury requiring diagnostic workup.  Patient's diagnosis is consistent with nontraumatic neck pain of unclear etiology.  Geriatric patient with reassuring exam and workup at this time.  Symptoms are likely due to underlying DDD and arthritis.  Patient will be discharged home with prescriptions for Ultram to take up to twice a day as needed. Patient is to follow up with his primary provider as discussed, as needed or otherwise directed. Patient is given ED precautions to return to the ED for any worsening or new symptoms.   FINAL CLINICAL IMPRESSION(S) / ED DIAGNOSES   Final diagnoses:  Neck pain, bilateral     Rx / DC Orders   ED Discharge Orders          Ordered    traMADol (ULTRAM) 50 MG tablet  2 times daily        12/16/23 1814             Note:  This document was prepared using Dragon voice recognition software and may include unintentional dictation errors.    Lissa Hoard, PA-C 12/16/23 1814    Delton Pra[place], MD 12/20/23 [phone]

## 2023-12-16 NOTE — ED Notes (Signed)
[facility] called for transport to [facility] 

## 2023-12-28 NOTE — Progress Notes (Signed)
 OMFS Procedure Note  Assessment: George Bolton is a 83 y.o. 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  Plan: - Extraction #13-15 under local anesthesia. Procedure note listed below - Consent obtained   - Post-operative instructions provided in written and verbal form - Recommend OTC tylenol  and ibuprofen q6h in alternating fashion x 2-3 days and taper as pain improves - Oral care TID - Pt discharged ambulatory - Establish care with General Dentist for comprehensive dental care - Follow-up OMFS PRN  Procedure codes: - D7140: Simple extraction of #13, 14, 15   PMH, medications and allergies were reviewed. No changes noted.   -------------------------------------------------------------------------------------------------------------------  Objective: BP 199/119 (BP Position: Sitting)   Pulse 92   SpO2 94%   Physical Examination : NEURO: CN II-XII grossly intact HEENT: No significant facial edema. No erythema. Face is symmetric. No cervical LAD. MIO >68mm. Intraorally the mucous membranes are pink and well perfused. Partial dentition in poor repair. Mobility of left posterior maxilla. FOM soft; uvula midline; oropharynx clear.  CV: RRR PULM: Normal work of breathing ABD: Non distended  Imaging: PA: Significant bone lone involving teeth #13, 14, 15      -------------------------------------------------------------------------------------------------   Description of procedure:  2 carpules of lidocaine  2% with 1:100,000 epinephrine   A periosteal elevator was used to reflect the gingival tissue around the teeth #13, 14, 15. The tooth was then elevated with straight elevators and extracted with forceps. The socket was curetted and irrigated. Gauze pack placed for hemostasis.  Complications: None

## 2024-05-15 ENCOUNTER — Inpatient Hospital Stay
Admit: 2024-05-15 | Discharge: 2024-05-15 | Disposition: A | Discharge status: Home / Self Care | Attending: Nurse Practitioner | Admitting: Nurse Practitioner

## 2024-05-15 ENCOUNTER — Other Ambulatory Visit: Payer: Self-pay

## 2024-05-15 ENCOUNTER — Emergency Department

## 2024-05-15 ENCOUNTER — Inpatient Hospital Stay
Admission: EM | Admit: 2024-05-15 | Discharge: 2024-05-19 | DRG: 291 | Disposition: A | Discharge status: Home / Self Care | Attending: Internal Medicine | Admitting: Internal Medicine

## 2024-05-15 ENCOUNTER — Encounter: Payer: Self-pay | Admitting: Emergency Medicine

## 2024-05-15 DIAGNOSIS — N184 Chronic kidney disease, stage 4 (severe): Secondary | ICD-10-CM | POA: Diagnosis present

## 2024-05-15 DIAGNOSIS — R54 Age-related physical debility: Secondary | ICD-10-CM | POA: Diagnosis present

## 2024-05-15 DIAGNOSIS — J9811 Atelectasis: Secondary | ICD-10-CM | POA: Diagnosis present

## 2024-05-15 DIAGNOSIS — E1122 Type 2 diabetes mellitus with diabetic chronic kidney disease: Secondary | ICD-10-CM | POA: Diagnosis present

## 2024-05-15 DIAGNOSIS — I482 Chronic atrial fibrillation, unspecified: Secondary | ICD-10-CM | POA: Diagnosis present

## 2024-05-15 DIAGNOSIS — R0602 Shortness of breath: Principal | ICD-10-CM | POA: Diagnosis present

## 2024-05-15 DIAGNOSIS — I5031 Acute diastolic (congestive) heart failure: Secondary | ICD-10-CM

## 2024-05-15 DIAGNOSIS — Z8673 Personal history of transient ischemic attack (TIA), and cerebral infarction without residual deficits: Secondary | ICD-10-CM

## 2024-05-15 DIAGNOSIS — I10 Essential (primary) hypertension: Secondary | ICD-10-CM | POA: Diagnosis not present

## 2024-05-15 DIAGNOSIS — D696 Thrombocytopenia, unspecified: Secondary | ICD-10-CM | POA: Diagnosis present

## 2024-05-15 DIAGNOSIS — E785 Hyperlipidemia, unspecified: Secondary | ICD-10-CM | POA: Diagnosis present

## 2024-05-15 DIAGNOSIS — Z823 Family history of stroke: Secondary | ICD-10-CM

## 2024-05-15 DIAGNOSIS — I130 Hypertensive heart and chronic kidney disease with heart failure and stage 1 through stage 4 chronic kidney disease, or unspecified chronic kidney disease: Secondary | ICD-10-CM | POA: Diagnosis present

## 2024-05-15 DIAGNOSIS — Z87891 Personal history of nicotine dependence: Secondary | ICD-10-CM

## 2024-05-15 DIAGNOSIS — I890 Lymphedema, not elsewhere classified: Secondary | ICD-10-CM | POA: Diagnosis present

## 2024-05-15 DIAGNOSIS — Z993 Dependence on wheelchair: Secondary | ICD-10-CM | POA: Diagnosis not present

## 2024-05-15 DIAGNOSIS — Z888 Allergy status to other drugs, medicaments and biological substances status: Secondary | ICD-10-CM

## 2024-05-15 DIAGNOSIS — I5033 Acute on chronic diastolic (congestive) heart failure: Secondary | ICD-10-CM | POA: Diagnosis present

## 2024-05-15 DIAGNOSIS — I5041 Acute combined systolic (congestive) and diastolic (congestive) heart failure: Secondary | ICD-10-CM | POA: Diagnosis not present

## 2024-05-15 DIAGNOSIS — I2489 Other forms of acute ischemic heart disease: Secondary | ICD-10-CM | POA: Diagnosis present

## 2024-05-15 DIAGNOSIS — Z66 Do not resuscitate: Secondary | ICD-10-CM | POA: Diagnosis present

## 2024-05-15 DIAGNOSIS — E872 Acidosis, unspecified: Secondary | ICD-10-CM | POA: Diagnosis present

## 2024-05-15 DIAGNOSIS — Z79899 Other long term (current) drug therapy: Secondary | ICD-10-CM

## 2024-05-15 DIAGNOSIS — Z7901 Long term (current) use of anticoagulants: Secondary | ICD-10-CM

## 2024-05-15 DIAGNOSIS — N400 Enlarged prostate without lower urinary tract symptoms: Secondary | ICD-10-CM | POA: Diagnosis present

## 2024-05-15 DIAGNOSIS — Z7401 Bed confinement status: Secondary | ICD-10-CM

## 2024-05-15 DIAGNOSIS — Z7984 Long term (current) use of oral hypoglycemic drugs: Secondary | ICD-10-CM | POA: Diagnosis not present

## 2024-05-15 DIAGNOSIS — I2510 Atherosclerotic heart disease of native coronary artery without angina pectoris: Secondary | ICD-10-CM | POA: Diagnosis present

## 2024-05-15 DIAGNOSIS — Z1152 Encounter for screening for COVID-19: Secondary | ICD-10-CM

## 2024-05-15 DIAGNOSIS — M1A9XX0 Chronic gout, unspecified, without tophus (tophi): Secondary | ICD-10-CM | POA: Diagnosis present

## 2024-05-15 DIAGNOSIS — M7989 Other specified soft tissue disorders: Secondary | ICD-10-CM

## 2024-05-15 DIAGNOSIS — I509 Heart failure, unspecified: Secondary | ICD-10-CM

## 2024-05-15 LAB — ECHOCARDIOGRAM COMPLETE
AR max vel: 1.79 cm2
AV Area VTI: 2.04 cm2
AV Area mean vel: 1.78 cm2
AV Mean grad: 1.7 mmHg
AV Peak grad: 2.8 mmHg
Ao pk vel: 0.84 m/s
Area-P 1/2: 5.93 cm2
Calc EF: 39.7 %
Height: 73 in
S' Lateral: 3.4 cm
Single Plane A2C EF: 40.5 %
Single Plane A4C EF: 39.4 %
Weight: 2688 [oz_av]

## 2024-05-15 LAB — CBG MONITORING, ED
Glucose-Capillary: 118 mg/dL — ABNORMAL HIGH (ref 70–99)
Glucose-Capillary: 99 mg/dL (ref 70–99)
Glucose-Capillary: 129 mg/dL — ABNORMAL HIGH (ref 70–99)

## 2024-05-15 LAB — TROPONIN I (HIGH SENSITIVITY)
Troponin I (High Sensitivity): 27 ng/L — ABNORMAL HIGH (ref <18)
Troponin I (High Sensitivity): 30 ng/L — ABNORMAL HIGH (ref <18)

## 2024-05-15 LAB — BASIC METABOLIC PANEL WITH GFR
Anion gap: 11 (ref 5–15)
BUN: 36 mg/dL — ABNORMAL HIGH (ref 8–23)
CO2: 19 mmol/L — ABNORMAL LOW (ref 22–32)
Calcium: 9.4 mg/dL (ref 8.9–10.3)
Chloride: 111 mmol/L (ref 98–111)
Creatinine, Ser: 2.43 mg/dL — ABNORMAL HIGH (ref 0.61–1.24)
GFR, Estimated: 25 mL/min — ABNORMAL LOW (ref >60)
Glucose, Bld: 126 mg/dL — ABNORMAL HIGH (ref 70–99)
Potassium: 4.7 mmol/L (ref 3.5–5.1)
Sodium: 141 mmol/L (ref 135–145)

## 2024-05-15 LAB — RESP PANEL BY RT-PCR (RSV, FLU A&B, COVID)  RVPGX2
Influenza A by PCR: NEGATIVE
Influenza B by PCR: NEGATIVE
Resp Syncytial Virus by PCR: NEGATIVE
SARS Coronavirus 2 by RT PCR: NEGATIVE

## 2024-05-15 LAB — CBC WITH DIFFERENTIAL/PLATELET
Abs Immature Granulocytes: 0.02 K/uL (ref 0.00–0.07)
Basophils Absolute: 0 K/uL (ref 0.0–0.1)
Basophils Relative: 0 %
Eosinophils Absolute: 0.1 K/uL (ref 0.0–0.5)
Eosinophils Relative: 1 %
HCT: 38.1 % — ABNORMAL LOW (ref 39.0–52.0)
Hemoglobin: 11.8 g/dL — ABNORMAL LOW (ref 13.0–17.0)
Immature Granulocytes: 0 %
Lymphocytes Relative: 16 %
Lymphs Abs: 1 K/uL (ref 0.7–4.0)
MCH: 28 pg (ref 26.0–34.0)
MCHC: 31 g/dL (ref 30.0–36.0)
MCV: 90.5 fL (ref 80.0–100.0)
Monocytes Absolute: 0.7 K/uL (ref 0.1–1.0)
Monocytes Relative: 11 %
Neutro Abs: 4.5 K/uL (ref 1.7–7.7)
Neutrophils Relative %: 72 %
Platelets: 108 K/uL — ABNORMAL LOW (ref 150–400)
RBC: 4.21 MIL/uL — ABNORMAL LOW (ref 4.22–5.81)
RDW: 17.1 % — ABNORMAL HIGH (ref 11.5–15.5)
WBC: 6.3 K/uL (ref 4.0–10.5)
nRBC: 0 % (ref 0.0–0.2)

## 2024-05-15 LAB — D-DIMER, QUANTITATIVE: D-Dimer, Quant: 0.57 ug{FEU}/mL — ABNORMAL HIGH (ref 0.00–0.50)

## 2024-05-15 LAB — GLUCOSE, CAPILLARY
Glucose-Capillary: 118 mg/dL — ABNORMAL HIGH (ref 70–99)
Glucose-Capillary: 178 mg/dL — ABNORMAL HIGH (ref 70–99)

## 2024-05-15 LAB — HEMOGLOBIN A1C
Hgb A1c MFr Bld: 6.2 % — ABNORMAL HIGH (ref 4.8–5.6)
Mean Plasma Glucose: 131.24 mg/dL

## 2024-05-15 LAB — PROCALCITONIN: Procalcitonin: <0.1 ng/mL

## 2024-05-15 LAB — BRAIN NATRIURETIC PEPTIDE: B Natriuretic Peptide: 856.3 pg/mL — ABNORMAL HIGH (ref 0.0–100.0)

## 2024-05-15 MED ORDER — APIXABAN 2.5 MG PO TABS
2.5000 mg | ORAL_TABLET | Freq: Two times a day (BID) | ORAL | Status: DC
  Administered 2024-05-15 – 2024-05-19 (×9): 2.5 mg via ORAL
  Filled 2024-05-15 (×10): qty 1

## 2024-05-15 MED ORDER — SODIUM CHLORIDE 0.9% FLUSH
3.0000 mL | Freq: Two times a day (BID) | INTRAVENOUS | Status: DC
  Administered 2024-05-15 – 2024-05-19 (×9): 3 mL via INTRAVENOUS

## 2024-05-15 MED ORDER — ISOSORBIDE MONONITRATE ER 30 MG PO TB24
30.0000 mg | ORAL_TABLET | Freq: Every day | ORAL | Status: DC
  Administered 2024-05-15 – 2024-05-19 (×5): 30 mg via ORAL
  Filled 2024-05-15 (×5): qty 1

## 2024-05-15 MED ORDER — TAMSULOSIN HCL 0.4 MG PO CAPS
0.4000 mg | ORAL_CAPSULE | Freq: Every day | ORAL | Status: DC
  Administered 2024-05-15 – 2024-05-18 (×4): 0.4 mg via ORAL
  Filled 2024-05-15 (×4): qty 1

## 2024-05-15 MED ORDER — MIRTAZAPINE 15 MG PO TABS
7.5000 mg | ORAL_TABLET | Freq: Every day | ORAL | Status: DC
  Administered 2024-05-15 – 2024-05-18 (×4): 7.5 mg via ORAL
  Filled 2024-05-15 (×4): qty 1

## 2024-05-15 MED ORDER — METOPROLOL SUCCINATE ER 100 MG PO TB24
100.0000 mg | ORAL_TABLET | Freq: Every evening | ORAL | Status: DC
  Administered 2024-05-15: 100 mg via ORAL
  Filled 2024-05-15: qty 1

## 2024-05-15 MED ORDER — CLONIDINE HCL 0.1 MG/24HR TD PTWK
0.1000 mg | MEDICATED_PATCH | TRANSDERMAL | Status: DC
  Administered 2024-05-17: 0.1 mg via TRANSDERMAL
  Filled 2024-05-15: qty 1

## 2024-05-15 MED ORDER — FUROSEMIDE 10 MG/ML IJ SOLN
40.0000 mg | Freq: Once | INTRAMUSCULAR | Status: AC
  Administered 2024-05-15: 40 mg via INTRAVENOUS
  Filled 2024-05-15: qty 4

## 2024-05-15 MED ORDER — FINASTERIDE 5 MG PO TABS
5.0000 mg | ORAL_TABLET | Freq: Every day | ORAL | Status: DC
  Administered 2024-05-15 – 2024-05-19 (×5): 5 mg via ORAL
  Filled 2024-05-15 (×5): qty 1

## 2024-05-15 MED ORDER — TRAZODONE HCL 50 MG PO TABS
25.0000 mg | ORAL_TABLET | Freq: Every evening | ORAL | Status: DC | PRN
  Administered 2024-05-17: 25 mg via ORAL
  Filled 2024-05-15: qty 1

## 2024-05-15 MED ORDER — INSULIN ASPART 100 UNIT/ML IJ SOLN
0.0000 [IU] | Freq: Three times a day (TID) | INTRAMUSCULAR | Status: DC
  Administered 2024-05-16: 3 [IU] via SUBCUTANEOUS
  Administered 2024-05-18: 2 [IU] via SUBCUTANEOUS
  Filled 2024-05-15 (×4): qty 1

## 2024-05-15 MED ORDER — ROSUVASTATIN CALCIUM 10 MG PO TABS
20.0000 mg | ORAL_TABLET | Freq: Every day | ORAL | Status: DC
  Administered 2024-05-15 – 2024-05-17 (×3): 20 mg via ORAL
  Filled 2024-05-15: qty 1
  Filled 2024-05-15 (×2): qty 2

## 2024-05-15 MED ORDER — HYDRALAZINE HCL 25 MG PO TABS
25.0000 mg | ORAL_TABLET | Freq: Three times a day (TID) | ORAL | Status: DC
  Administered 2024-05-15 – 2024-05-18 (×10): 25 mg via ORAL
  Filled 2024-05-15 (×11): qty 1

## 2024-05-15 MED ORDER — INSULIN ASPART 100 UNIT/ML IJ SOLN: 0.0000 [IU] | Freq: Every day | INTRAMUSCULAR | Status: DC

## 2024-05-15 MED ORDER — EMPAGLIFLOZIN 10 MG PO TABS
10.0000 mg | ORAL_TABLET | Freq: Every day | ORAL | Status: DC
  Administered 2024-05-15 – 2024-05-19 (×5): 10 mg via ORAL
  Filled 2024-05-15 (×5): qty 1

## 2024-05-15 MED ORDER — ALLOPURINOL 100 MG PO TABS
100.0000 mg | ORAL_TABLET | Freq: Every day | ORAL | Status: DC
  Administered 2024-05-15 – 2024-05-19 (×5): 100 mg via ORAL
  Filled 2024-05-15 (×5): qty 1

## 2024-05-15 MED ORDER — FUROSEMIDE 10 MG/ML IJ SOLN
60.0000 mg | Freq: Every day | INTRAMUSCULAR | Status: DC
  Administered 2024-05-15 – 2024-05-18 (×4): 60 mg via INTRAVENOUS
  Filled 2024-05-15: qty 6
  Filled 2024-05-15: qty 8
  Filled 2024-05-15 (×2): qty 6

## 2024-05-15 NOTE — TOC Initial Note (Signed)
 Transition of Care [facility]) - Initial/Assessment Note    Patient Details  Name: George Bolton MRN: [id number] Date of Birth: [birth year]  [facility]) CM/SW Contact:    Edsel DELENA Fischer, LCSW Phone Number: 05/15/2024, 7:12 PM  Clinical Narrative:                  SW received message from PT [place] and [place]) regarding following up with [facility] regarding placement.  SW spoke with Grayce, RN [phone]).  Robin expressed that [facility] is pretty much pt insurance and that she will follow up with her team regarding placement.  Robin expressed that pt has been home for about a month and was at [facility] for a year in respite.  Pt lives at home with this wife and son help at times but does not live in the home.  Pt is receiving PT from [facility].  Pt would go to the facility, then [facility] for rehab. [facility] to [facility] facility daily now as well.  CNA is in place as well.  CNA schedule is 20 hours a week/ 2 hours morning, 2 hours evening.  Robin expressed that pt has sit and stand at home that helps with transfers.  Grayce stated that she will call sw back with facility decision.      Patient Goals and CMS Choice            Expected Discharge Plan and Services                                              Prior Living Arrangements/Services                       Activities of Daily Living   ADL Screening (condition at time of admission) Independently performs ADLs?: No Does the patient have a NEW difficulty with bathing/dressing/toileting/self-feeding that is expected to last >3 days?: No Does the patient have a NEW difficulty with getting in/out of bed, walking, or climbing stairs that is expected to last >3 days?: No Does the patient have a NEW difficulty with communication that is expected to last >3 days?: No Is the patient deaf or have difficulty hearing?: No Does the patient have difficulty seeing, even when wearing glasses/contacts?: No Does the patient  have difficulty concentrating, remembering, or making decisions?: No  Permission Sought/Granted                  Emotional Assessment              Admission diagnosis:  Shortness of breath [R06.02] Leg swelling [M79.89] Acute CHF (congestive heart failure) (HCC) [I50.9] Patient Active Problem List   Diagnosis Date Noted   Acute CHF (congestive heart failure) (HCC) 05/15/2024   COVID 06/12/2023   Coronary artery disease 04/26/2023   Essential hypertension 04/26/2023   BPH (benign prostatic hyperplasia) 04/26/2023   Depression 04/26/2023   Acute on chronic systolic CHF (congestive heart failure) (HCC) 04/26/2023   Acute on chronic diastolic CHF (congestive heart failure) (HCC) 04/25/2023   SOB (shortness of breath) 09/18/2022   Acute on chronic diastolic (congestive) heart failure (HCC) 07/16/2022   Atrial fibrillation, chronic (HCC) 07/16/2022   Dyslipidemia 07/16/2022   CKD (chronic kidney disease), stage IV (HCC) 07/16/2022   Left leg swelling    NSVT (nonsustained ventricular tachycardia) (HCC) 12/10/2021  Type 2 diabetes mellitus with diabetic nephropathy (HCC) 12/10/2021   Elevated troponin 12/10/2021   History of stroke 12/10/2021   Osteoarthritis of knee 12/10/2021   Acute kidney injury superimposed on chronic kidney disease (HCC) 11/22/2021   Lymphedema 03/15/2018   Gout 01/25/2017   Hypertension 04/25/2015   PCP:  Cecillia Landry RAMAN, MD Pharmacy:   [facility] [address]), [place] - [address] [place] ([place]) [place] [address] Phone: [phone] Fax: [phone]  [facility] [address] [place] - [address] [place] [address] Phone: [phone] Fax: [phone]  [facility] [place], [place] - [address] [place] [address] Phone: [phone] Fax: [phone]     Social Drivers of Health (SDOH) Social History: SDOH Screenings    Food Insecurity: No Food Insecurity (05/15/2024)  Housing: Low Risk  (05/15/2024)  Transportation Needs: No Transportation Needs (05/15/2024)  Utilities: Not At Risk (05/15/2024)  Tobacco Use: Medium Risk (05/15/2024)   SDOH Interventions:     Readmission Risk Interventions    09/20/2022   10:22 AM  Readmission Risk Prevention Plan  Post Dischage Appt Complete  Medication Screening Complete  Transportation Screening Complete

## 2024-05-15 NOTE — ED Triage Notes (Signed)
 BIBA due to feeling SOB and difficulty breathing.  Pt has hx of CHF, HTN and stage 4 kidney disease.   Has edema to left leg.  Less than has been pulled off him.  External purewick on when he arrived.  EMS states fluid on lungs and wet cough.  96% on RA 106/101

## 2024-05-15 NOTE — ED Provider Notes (Signed)
 [facility] Provider Note    Event Date/Time   First MD Initiated Contact with Patient 05/15/24 0009     (approximate)   History   Shortness of Breath   HPI  George Bolton is a 83 y.o. male with history of chronic atrial fibrillation on Eliquis , diastolic heart failure, hypertension, stroke, chronic kidney disease who presents to the emergency department from nursing facility with complaints of increased shortness of breath and leg swelling worse in the left leg compared to the right.  EMS reports rhonchorous breath sounds, wet cough.  No known fevers, chills.  Patient denies any chest pain.   History provided by patient, EMS.    Past Medical History:  Diagnosis Date   Acute embolic stroke (HCC) 11/26/2021   Chronic atrial fibrillation (HCC)    a. Afib dates back to at least 2002 when reviewing prior EKG with EKGs from 2014 onward showing Afib; b. CHADS2VASc at least 4 (CHF, HTN, age x 2)   Chronic diastolic CHF (congestive heart failure) (HCC)    a. TTE 5/19: EF 70-75%, mod LVH, near complete obliteration of the mid and apical LV cavity (can be seen in apical hypertrophic CM), mildly dilated LA, RV cavity size nl, RV wall thickness nl, RVSF mildly reduced, mildly dilated RA; b. 02/2018 Limited echo w/ definity : EF 50-65%, no apical hypertrophic CM.   Community acquired bilateral lower lobe pneumonia 11/23/2021   Goals of care, counseling/discussion 12/11/2021   Gout    Hypertension    Hypertensive emergency 12/10/2021   Hypertensive urgency 07/16/2022    Past Surgical History:  Procedure Laterality Date   CHOLECYSTECTOMY N/A 01/13/2017   Procedure: LAPAROSCOPIC CHOLECYSTECTOMY;  Surgeon: Laneta JULIANNA Luna, MD;  Location: [facility] ORS;  Service: General;  Laterality: N/A;    MEDICATIONS:  Prior to Admission medications   Medication Sig Start Date End Date Taking? Authorizing Provider  acetaminophen  (TYLENOL ) 325 MG tablet Take 2 tablets (650 mg total)  by mouth every 6 (six) hours as needed for mild pain (or Fever >/= 101). Patient taking differently: Take 650 mg by mouth every 8 (eight) hours as needed for mild pain (pain score 1-3) (or Fever >/= 101). 11/28/21   Josette Ade, MD  allopurinol  (ZYLOPRIM ) 100 MG tablet Take 1 tablet (100 mg total) by mouth daily. Home med. 09/20/22   Awanda City, MD  apixaban  (ELIQUIS ) 2.5 MG TABS tablet Take 2.5 mg by mouth 2 (two) times daily. 12/27/19   [provider]  bisacodyl  (DULCOLAX) 5 MG EC tablet Take 2 tablets (10 mg total) by mouth at bedtime. Skip the dose if no constipation 06/16/23   Von Bellis, MD  CATAPRES -TTS-1 0.1 MG/24HR patch 0.1 mg once a week. 08/23/23   [provider]  empagliflozin  (JARDIANCE ) 10 MG TABS tablet Take 10 mg by mouth daily.    [provider]  fexofenadine (ALLEGRA) 180 MG tablet Take 180 mg by mouth daily.    [provider]  finasteride  (PROSCAR ) 5 MG tablet Take 1 tablet (5 mg total) by mouth daily. 05/29/22   Vaillancourt, Samantha, PA-C  hydrALAZINE  (APRESOLINE ) 25 MG tablet Take 25 mg by mouth 3 (three) times daily.    [provider]  isosorbide  mononitrate (IMDUR ) 30 MG 24 hr tablet Take 1 tablet (30 mg total) by mouth daily. Hold if SBP <130 mmHg 06/15/23   Von Bellis, MD  metoprolol  succinate (TOPROL -XL) 100 MG 24 hr tablet Take 1 tablet (100 mg total) by mouth every  evening. Take with or immediately following a meal.  Home med. 09/20/22   Awanda City, MD  mirtazapine  (REMERON ) 7.5 MG tablet Take 7.5 mg by mouth at bedtime.    [provider]  nystatin  (MYCOSTATIN /NYSTOP ) powder Apply 1 Application topically 2 (two) times daily. 05/22/22   [provider]  polyethylene glycol (MIRALAX  / GLYCOLAX ) 17 g packet Take 17 g by mouth 2 (two) times daily. Skip the dose if no constipation 06/15/23   Von Bellis, MD  rosuvastatin  (CRESTOR ) 20 MG tablet Take 1 tablet (20 mg total) by mouth daily. 11/28/21   Josette Ade, MD  tamsulosin  (FLOMAX ) 0.4 MG CAPS capsule Take 1 capsule (0.4 mg total) by mouth daily after supper. 06/15/23   Von Bellis, MD  traZODone  (DESYREL ) 50 MG tablet Take 0.5 tablets (25 mg total) by mouth at bedtime as needed for sleep. 04/27/23   Dorinda Drue DASEN, MD    Physical Exam   Triage Vital Signs: ED Triage Vitals  Encounter Vitals Group     BP 05/15/24 0017 (!) 153/93     Girls Systolic BP Percentile --      Girls Diastolic BP Percentile --      Boys Systolic BP Percentile --      Boys Diastolic BP Percentile --      Pulse Rate 05/15/24 0017 78     Resp 05/15/24 0017 18     Temp 05/15/24 0017 98.7 F (37.1 C)     Temp Source 05/15/24 0017 Oral     SpO2 05/15/24 0017 94 %     Weight 05/15/24 0019 168 lb (76.2 kg)     Height 05/15/24 0019 6' 1 (1.854 m)     Head Circumference --      Peak Flow --      Pain Score 05/15/24 0018 0     Pain Loc --      Pain Education --      Exclude from Growth Chart --     Most recent vital signs: Vitals:   05/15/24 0017 05/15/24 0103  BP: (!) 153/93   Pulse: 78   Resp: 18   Temp: 98.7 F (37.1 C)   SpO2: 94% 92%    CONSTITUTIONAL: Alert, responds appropriately to questions.  Chronically ill-appearing, elderly HEAD: Normocephalic, atraumatic EYES: Conjunctivae clear, pupils appear equal, sclera nonicteric ENT: normal nose; moist mucous membranes NECK: Supple, normal ROM CARD: Irregular; S1 and S2 appreciated RESP: Patient appears dyspneic with mild tachypnea.  Diminished aeration at bases bilaterally.  No wheezing.  Wet cough. ABD/GI: Non-distended; soft, non-tender, no rebound, no guarding, no peritoneal signs BACK: The back appears normal EXT: Normal ROM in all joints; no deformity noted, pitting edema in bilateral lower extremities worse on the left than the right, no calf tenderness, compartments soft, extremities warm SKIN: Normal color for age and race; warm; no rash on exposed skin NEURO: Moves all extremities  equally, normal speech PSYCH: The patient's mood and manner are appropriate.   ED Results / Procedures / Treatments   LABS: (all labs ordered are listed, but only abnormal results are displayed) Labs Reviewed  CBC WITH DIFFERENTIAL/PLATELET - Abnormal; Notable for the following components:      Result Value   RBC 4.21 (*)    Hemoglobin 11.8 (*)    HCT 38.1 (*)    RDW 17.1 (*)    Platelets 108 (*)    All other components within normal limits  BASIC METABOLIC PANEL WITH GFR -  Abnormal; Notable for the following components:   CO2 19 (*)    Glucose, Bld 126 (*)    BUN 36 (*)    Creatinine, Ser 2.43 (*)    GFR, Estimated 25 (*)    All other components within normal limits  BRAIN NATRIURETIC PEPTIDE - Abnormal; Notable for the following components:   B Natriuretic Peptide 856.3 (*)    All other components within normal limits  D-DIMER, QUANTITATIVE - Abnormal; Notable for the following components:   D-Dimer, Quant 0.57 (*)    All other components within normal limits  TROPONIN I (HIGH SENSITIVITY) - Abnormal; Notable for the following components:   Troponin I (High Sensitivity) 27 (*)    All other components within normal limits  TROPONIN I (HIGH SENSITIVITY) - Abnormal; Notable for the following components:   Troponin I (High Sensitivity) 30 (*)    All other components within normal limits  RESP PANEL BY RT-PCR (RSV, FLU A&B, COVID)  [id number]  PROCALCITONIN     EKG:  EKG Interpretation Date/Time:  Monday May 15 2024 00:58:32 EDT Ventricular Rate:  80 PR Interval:    QRS Duration:  92 QT Interval:  439 QTC Calculation: 507 R Axis:   0  Text Interpretation: Atrial fibrillation Borderline low voltage, extremity leads Nonspecific T abnormalities, lateral leads Prolonged QT interval Confirmed by Neomi Neptune [phone]) on 05/15/2024 1:00:27 AM         RADIOLOGY: My personal review and interpretation of imaging: CT chest shows bilateral pleural effusions.  No DVT on  ultrasound.  I have personally reviewed all radiology reports.   CT Chest Wo Contrast Result Date: 05/15/2024 EXAM: CT CHEST WITHOUT CONTRAST 05/15/2024 02:40:08 AM TECHNIQUE: CT of the chest was performed without the administration of intravenous contrast. Multiplanar reformatted images are provided for review. Automated exposure control, iterative reconstruction, and/or weight based adjustment of the mA/kV was utilized to reduce the radiation dose to as low as reasonably achievable. COMPARISON: Chest radiograph earlier today. CLINICAL HISTORY: SOB, clear CXR, appears volume overloaded on exam. BIBA due to feeling SOB and difficulty breathing. Pt has hx of CHF, HTN and stage 4 kidney disease. Has edema to left leg. Less than has been pulled off him. External purewick on when he arrived. EMS states fluid on lungs and wet cough. FINDINGS: MEDIASTINUM: Mild cardiomegaly. Moderate 3-vessel coronary atherosclerosis. LYMPH NODES: No mediastinal, hilar or axillary lymphadenopathy. LUNGS AND PLEURA: Mild dependent atelectasis in the bilateral lower lobes. No frank interstitial edema, noting motion degradation. calcified granuloma in the right middle lobe, benign 3 mm noncalcified nodule in the right upper lobe ( image 24 ), likely benign. No follow up is recommended. Small bilateral pleural effusions. No pneumothorax. SOFT TISSUES/BONES: Degenerative changes of the visualized thoracolumbar spine. UPPER ABDOMEN: Status post cholecystectomy. Bilateral Nonobstructing renal calculi measuring up to 11 mm in the left lower kidney (image 164). Bilateral simple renal cyst, measuring up to 5.8 cm in the left renal sinus (image 148), benign (Bosniak 1), no follow-up is recommended. Probable hemorrhagic cysts measuring up to 1.7 cm in the left lower kidney (image 156), likely benign but technically indeterminate/incompletely visualized. Given the patient's age, dedicated follow-up is considered optional. Atherosclerotic  calcifications of the abdominal aorta and branch vessels. IMPRESSION: 1. Mild cardiomegaly.  No frank interstitial edema, noting motion degradation. 2. Small bilateral pleural effusions. 3. Mild dependent atelectasis in the bilateral lower lobes. Electronically signed by: Pinkie Pebbles MD 05/15/2024 02:48 AM EDT RP Workstation: [id number]   DG  Chest Portable 1 View Result Date: 05/15/2024 EXAM: 1 VIEW XRAY OF THE CHEST 05/15/2024 01:59:33 AM COMPARISON: 06/12/2023 CLINICAL HISTORY: SOB. PER ER NOTE; BIBA due to feeling SOB and difficulty breathing. Pt has hx of CHF, HTN and stage 4 kidney disease. Has edema to left leg. Less than has been pulled off him. External purewick on when he arrived. EMS states fluid on lungs and wet cough. FINDINGS: LUNGS AND PLEURA: No frank interstitial edema. No pleural effusion or pneumothorax. HEART AND MEDIASTINUM: Cardiomegaly. Calcified aorta. BONES AND SOFT TISSUES: No acute osseous abnormality. Mild eventration of the right hemidiaphragm. IMPRESSION: 1. No acute findings. 2. Cardiomegaly. No frank interstitial edema. Electronically signed by: Pinkie Pebbles MD 05/15/2024 02:03 AM EDT RP Workstation: [id number]   US  Venous Img Lower Bilateral Result Date: 05/15/2024 EXAM: ULTRASOUND DUPLEX OF THE BILATERAL LOWER EXTREMITY VEINS TECHNIQUE: Duplex ultrasound using B-mode/gray scaled imaging and Doppler spectral analysis and color flow was obtained of the deep venous structures of the bilateral lower extremity. COMPARISON: None. CLINICAL HISTORY: Leg swelling, worse on left side. FINDINGS: The visualized veins of the lower extremity are patent and free of echogenic thrombus. The veins demonstrate good compressibility with normal color flow study and spectral analysis. IMPRESSION: 1. No evidence of DVT in the bilateral lower extremities. Electronically signed by: Pinkie Pebbles MD 05/15/2024 01:05 AM EDT RP Workstation: [id number]     PROCEDURES:  Critical Care  performed: No    .1-3 Lead EKG Interpretation  Performed by: Ethen Bannan, Josette SAILOR, DO Authorized by: Kahron Kauth, Josette SAILOR, DO     Interpretation: abnormal     ECG rate:  78   ECG rate assessment: normal     Rhythm: atrial fibrillation     Ectopy: none     Conduction: normal       IMPRESSION / MDM / ASSESSMENT AND PLAN / ED COURSE  I reviewed the triage vital signs and the nursing notes.    Patient here with history of diastolic heart failure with shortness of breath, productive cough, bilateral lower extremity swelling.  The patient is on the cardiac monitor to evaluate for evidence of arrhythmia and/or significant heart rate changes.   DIFFERENTIAL DIAGNOSIS (includes but not limited to):   CHF exacerbation, ACS, pneumonia, viral URI, PE   Patient's presentation is most consistent with acute presentation with potential threat to life or bodily function.   PLAN: Will obtain labs, chest x-ray, EKG.  Will obtain venous Doppler of bilateral lower extremities to rule out DVT especially given asymmetry in his peripheral edema.  Will obtain COVID, flu and RSV swab, procalcitonin to evaluate for pneumonia given productive cough.   MEDICATIONS GIVEN IN ED: Medications  furosemide  (LASIX ) injection 40 mg (40 mg Intravenous Given 05/15/24 0049)     ED COURSE: Hemoglobin of 11.8.  Thrombocytopenia which appears chronic.  Stable chronic kidney disease.  Troponins minimally elevated but flat.  BNP 856.  COVID, flu and RSV negative.  Procalcitonin negative.  Age-adjusted D-dimer negative.  Bilateral venous Dopplers, chest x-ray reviewed and interpreted by myself and the radiologist.  No DVT, pneumonia, edema.  Will obtain CT of the chest without contrast given history of chronic kidney disease.  Patient not on dialysis.   CT scan shows cardiomegaly, bilateral pleural effusions.  Will give IV Lasix  for diuresis and discussed with hospitalist for admission.   CONSULTS:  Consulted and discussed  patient's case with hospitalist, Dr. Lawence.  I have recommended admission and consulting physician agrees and will place  admission orders.  Patient (and family if present) agree with this plan.   I reviewed all nursing notes, vitals, pertinent previous records.  All labs, EKGs, imaging ordered have been independently reviewed and interpreted by myself.    OUTSIDE RECORDS REVIEWED: Reviewed last admission in June 2024.       FINAL CLINICAL IMPRESSION(S) / ED DIAGNOSES   Final diagnoses:  Shortness of breath  Leg swelling     Rx / DC Orders   ED Discharge Orders     None        Note:  This document was prepared using Dragon voice recognition software and may include unintentional dictation errors.   Larnce Schnackenberg, Josette SAILOR, DO 05/15/24 [phone]

## 2024-05-15 NOTE — Progress Notes (Signed)
*  PRELIMINARY RESULTS* Echocardiogram 2D Echocardiogram has been performed.  Floydene Harder 05/15/2024, 10:04 AM

## 2024-05-15 NOTE — Plan of Care (Signed)
?  Problem: Clinical Measurements: ?Goal: Respiratory complications will improve ?Outcome: Progressing ?  ?Problem: Coping: ?Goal: Level of anxiety will decrease ?Outcome: Progressing ?  ?Problem: Elimination: ?Goal: Will not experience complications related to bowel motility ?Outcome: Progressing ?  ?

## 2024-05-15 NOTE — Evaluation (Signed)
 Physical Therapy Evaluation Patient Details Name: George Bolton MRN: [id number] DOB: [birth year] Today's Date: 05/15/2024  History of Present Illness  George Bolton is a 83 y.o. male with medical history significant of HFpEF, atrial fibrillation on Eliquis , gout, hypertension, chronic lower extremity lymphedema, CKD 4, diabetes mellitus 2, NSVT, prior history of stroke with mild right upper extremity deficits involving the hand, dyslipidemia, CAD and BPH.  Clinical Impression  Pt is a pleasant 83 year old male who was admitted for acute CHF. Pt poor historian and unable to confirm PLOF, also poor clarity of speech. Pt performs bed mobility with mod to max assist, requiring multimodal cueing to initiate and execute movement.Upon sitting EOB, pt with a heavy right lateral and posterior lean, unable to fix without multimodal cueing. STS transfers not appropriate this date d/t pt fatigue. Pt demonstrates deficits with balance, strength, and activity tolerance impacting his current QoL. Pt would benefit from skilled PT interventions to address deficits and meet therapy goals. PT to follow acutely as appropriate.          If plan is discharge home, recommend the following: A lot of help with walking and/or transfers;A lot of help with bathing/dressing/bathroom;Assist for transportation;Help with stairs or ramp for entrance;Supervision due to cognitive status   Can travel by private vehicle   No    Equipment Recommendations None recommended by PT  Recommendations for Other Services       Functional Status Assessment Patient has had a recent decline in their functional status and/or demonstrates limited ability to make significant improvements in function in a reasonable and predictable amount of time     Precautions / Restrictions Precautions Precautions: Fall Recall of Precautions/Restrictions: Impaired Restrictions Weight Bearing Restrictions Per Provider Order: No      Mobility  Bed  Mobility Overal bed mobility: Needs Assistance Bed Mobility: Supine to Sit, Sit to Supine     Supine to sit: Mod assist, Max assist Sit to supine: Max assist   General bed mobility comments: mod-max for supine <>sit, heavy right lateral and posterior lean, verbal and tactile cueing for midline posture but unable to fix without multimodal and increased time, pt states at baseline he can do that himself    Transfers                   General transfer comment: unable to perform this date d/t pt weakness    Ambulation/Gait               General Gait Details: pt non-ambulatory at baseline  Stairs            Wheelchair Mobility     Tilt Bed    Modified Rankin (Stroke Patients Only)       Balance Overall balance assessment: Needs assistance Sitting-balance support: Bilateral upper extremity supported, Feet supported Sitting balance-Leahy Scale: Poor Sitting balance - Comments: sitting EOB Postural control: Posterior lean, Right lateral lean     Standing balance comment: not tested this date                             Pertinent Vitals/Pain Pain Assessment Pain Assessment: No/denies pain    Home Living Family/patient expects to be discharged to:: Private residence Living Arrangements: Spouse/significant other;Children Available Help at Discharge: Family Type of Home: House Home Access: Ramped entrance       Home Layout: One level Home Equipment: Wheelchair - manual;Lift chair;Other (comment) (uses  a stedy for transfers) Additional Comments: Per medical record, pt is a participate in the [facility].    Prior Function Prior Level of Function : Needs assist;Patient poor historian/Family not available  Cognitive Assist : Mobility (cognitive);ADLs (cognitive) Mobility (Cognitive): Intermittent cues   Physical Assist : Mobility (physical);ADLs (physical) Mobility (physical): Bed mobility;Transfers;Gait ADLs (physical):  Grooming;Bathing;Dressing;Toileting;IADLs Mobility Comments: uses WC to get around, says at baseline he can stand pivot but unsure d/t pt being poor historian ADLs Comments: states he requires assistance but unable to elaborate this date     Extremity/Trunk Assessment   Upper Extremity Assessment Upper Extremity Assessment: Generalized weakness    Lower Extremity Assessment Lower Extremity Assessment: Generalized weakness    Cervical / Trunk Assessment Cervical / Trunk Assessment: Kyphotic  Communication   Communication Communication: Impaired Factors Affecting Communication: Difficulty expressing self;Reduced clarity of speech    Cognition Arousal: Alert Behavior During Therapy: Flat affect   PT - Cognitive impairments: Difficult to assess, Orientation, Awareness, No family/caregiver present to determine baseline Difficult to assess due to: Impaired communication Orientation impairments: Time, Situation                   PT - Cognition Comments: A&O x2 (impaired day/year), unable to confirm prior cognition Following commands: Impaired Following commands impaired: Follows one step commands inconsistently, Follows multi-step commands with increased time     Cueing Cueing Techniques: Verbal cues, Tactile cues     General Comments      Exercises     Assessment/Plan    PT Assessment Patient needs continued PT services  PT Problem List Decreased range of motion;Decreased strength;Decreased activity tolerance;Decreased balance;Decreased mobility;Decreased knowledge of use of DME;Decreased safety awareness       PT Treatment Interventions DME instruction;Gait training;Stair training;Functional mobility training;Therapeutic activities;Therapeutic exercise;Balance training;Patient/family education;Wheelchair mobility training    PT Goals (Current goals can be found in the Care Plan section)  Acute Rehab PT Goals Patient Stated Goal: to feel better and get  stronger PT Goal Formulation: With patient Time For Goal Achievement: 05/29/24 Potential to Achieve Goals: Good    Frequency Min 2X/week     Co-evaluation               AM-PAC PT 6 Clicks Mobility  Outcome Measure Help needed turning from your back to your side while in a flat bed without using bedrails?: A Lot Help needed moving from lying on your back to sitting on the side of a flat bed without using bedrails?: A Lot Help needed moving to and from a bed to a chair (including a wheelchair)?: A Lot Help needed standing up from a chair using your arms (e.g., wheelchair or bedside chair)?: A Lot Help needed to walk in hospital room?: Total Help needed climbing 3-5 steps with a railing? : Total 6 Click Score: 10    End of Session   Activity Tolerance: Patient limited by fatigue Patient left: in bed;with call bell/phone within reach;with bed alarm set Nurse Communication: Mobility status PT Visit Diagnosis: Muscle weakness (generalized) (M62.81);Other abnormalities of gait and mobility (R26.89)    Time: 8583-8555 PT Time Calculation (min) (ACUTE ONLY): 28 min   Charges:                   Jamesyn Lindell Romero-Perozo, SPT  05/15/2024, 3:09 PM

## 2024-05-15 NOTE — ED Notes (Signed)
 CCMD notified pt placed on cardiac monitor.

## 2024-05-15 NOTE — H&P (Cosign Needed Addendum)
 History and Physical    Patient: George Bolton FMW:[id number] DOB: 06/26/1941 DOA: 05/15/2024 DOS: the patient was seen and examined on 05/15/2024 PCP: Cecillia Landry RAMAN, MD  Patient coming from: [facility] for outpatient rehabilitative therapies  Chief Complaint:  Chief Complaint  Patient presents with   Shortness of Breath   HPI: George Bolton is a 83 y.o. male with medical history significant of HFpEF, atrial fibrillation on Eliquis , gout, hypertension, chronic lower extremity lymphedema, CKD 4, diabetes mellitus 2, NSVT, prior history of stroke with mild right upper extremity deficits involving the hand, dyslipidemia, CAD and BPH.  Presented to the ED via EMS reporting shortness of breath and difficulty breathing.  Noted with lower extremity bilateral edema.  Paramedics report that pulmonary exam prior to arrival consistent with volume overload with diffuse crackles.  Resting room air sats were 96%.  Lower extremity venous duplex negative for DVT.  Chest x-ray demonstrated cardiomegaly but no frank interstitial edema.  CT of the chest revealed no edema but there were small bilateral pleural effusions and mild dependent atelectasis in the bilateral lower lobes.  BNP was elevated at 856, troponin mildly elevated at 27 and 30 with flat trend.  D-dimer slightly elevated at 0.56.  PCR for COVID, flu and RSV negative.  Room air sats have been variable between 93 and 100%.  Diastolic blood pressures have been elevated in the 90 range.  Hospitalist service has been asked to evaluate the patient for admission.  Review of Systems: As mentioned in the history of present illness. All other systems reviewed and are negative.  Patient reports several days of shortness of breath without fevers or chills or chest pain, states he has been taking medications as prescribed.  States he has no Lasix /furosemide  at home.  Has been reporting nausea because he has to take 16 pills.  Possibly some constipation.  Confirms  he is receiving rehabilitative outpatient services through [facility].  Past Medical History:  Diagnosis Date   Acute embolic stroke (HCC) 11/26/2021   Chronic atrial fibrillation (HCC)    a. Afib dates back to at least 2002 when reviewing prior EKG with EKGs from 2014 onward showing Afib; b. CHADS2VASc at least 4 (CHF, HTN, age x 2)   Chronic diastolic CHF (congestive heart failure) (HCC)    a. TTE 5/19: EF 70-75%, mod LVH, near complete obliteration of the mid and apical LV cavity (can be seen in apical hypertrophic CM), mildly dilated LA, RV cavity size nl, RV wall thickness nl, RVSF mildly reduced, mildly dilated RA; b. 02/2018 Limited echo w/ definity : EF 50-65%, no apical hypertrophic CM.   Community acquired bilateral lower lobe pneumonia 11/23/2021   Goals of care, counseling/discussion 12/11/2021   Gout    Hypertension    Hypertensive emergency 12/10/2021   Hypertensive urgency 07/16/2022   Past Surgical History:  Procedure Laterality Date   CHOLECYSTECTOMY N/A 01/13/2017   Procedure: LAPAROSCOPIC CHOLECYSTECTOMY;  Surgeon: Laneta JULIANNA Luna, MD;  Location: [facility] ORS;  Service: General;  Laterality: N/A;   Social History:  reports that he has quit smoking. His smoking use included cigarettes. He has never used smokeless tobacco. He reports that he does not drink alcohol and does not use drugs.  Allergies  Allergen Reactions   Cortisone Anaphylaxis   Triamcinolone     Other reaction(s): Unknown    Family History  Problem Relation Age of Onset   Stroke Mother     Prior to Admission medications   Medication Sig Start Date  End Date Taking? Authorizing Provider  acetaminophen  (TYLENOL ) 325 MG tablet Take 2 tablets (650 mg total) by mouth every 6 (six) hours as needed for mild pain (or Fever >/= 101). Patient taking differently: Take 650 mg by mouth every 8 (eight) hours as needed for mild pain (pain score 1-3) (or Fever >/= 101). 11/28/21   Josette Ade, MD  allopurinol  (ZYLOPRIM ) 100  MG tablet Take 1 tablet (100 mg total) by mouth daily. Home med. 09/20/22   Awanda City, MD  apixaban  (ELIQUIS ) 2.5 MG TABS tablet Take 2.5 mg by mouth 2 (two) times daily. 12/27/19   [provider]  bisacodyl  (DULCOLAX) 5 MG EC tablet Take 2 tablets (10 mg total) by mouth at bedtime. Skip the dose if no constipation 06/16/23   Von Bellis, MD  CATAPRES -TTS-1 0.1 MG/24HR patch 0.1 mg once a week. 08/23/23   [provider]  empagliflozin  (JARDIANCE ) 10 MG TABS tablet Take 10 mg by mouth daily.    [provider]  fexofenadine (ALLEGRA) 180 MG tablet Take 180 mg by mouth daily.    [provider]  finasteride  (PROSCAR ) 5 MG tablet Take 1 tablet (5 mg total) by mouth daily. 05/29/22   Vaillancourt, Samantha, PA-C  hydrALAZINE  (APRESOLINE ) 25 MG tablet Take 25 mg by mouth 3 (three) times daily.    [provider]  isosorbide  mononitrate (IMDUR ) 30 MG 24 hr tablet Take 1 tablet (30 mg total) by mouth daily. Hold if SBP <130 mmHg 06/15/23   Von Bellis, MD  metoprolol  succinate (TOPROL -XL) 100 MG 24 hr tablet Take 1 tablet (100 mg total) by mouth every evening. Take with or immediately following a meal.  Home med. 09/20/22   Awanda City, MD  mirtazapine  (REMERON ) 7.5 MG tablet Take 7.5 mg by mouth at bedtime.    [provider]  nystatin  (MYCOSTATIN /NYSTOP ) powder Apply 1 Application topically 2 (two) times daily. 05/22/22   [provider]  polyethylene glycol (MIRALAX  / GLYCOLAX ) 17 g packet Take 17 g by mouth 2 (two) times daily. Skip the dose if no constipation 06/15/23   Von Bellis, MD  rosuvastatin  (CRESTOR ) 20 MG tablet Take 1 tablet (20 mg total) by mouth daily. 11/28/21   Josette Ade, MD  tamsulosin  (FLOMAX ) 0.4 MG CAPS capsule Take 1 capsule (0.4 mg total) by mouth daily after supper. 06/15/23   Von Bellis, MD  traZODone  (DESYREL ) 50 MG tablet Take 0.5 tablets (25 mg total) by mouth at bedtime as needed for sleep. 04/27/23   Dorinda Drue DASEN, MD    Physical Exam: Vitals:   05/15/24 0700 05/15/24 0800 05/15/24 0816 05/15/24 0900  BP:  (!) 158/99 (!) 156/99 (!) 165/88  Pulse: 71 84 79 69  Resp:   18 18  Temp:   (!) 96.1 F (35.6 C)   TempSrc:   Axillary   SpO2: 100% 96% 95% 94%  Weight:      Height:       Constitutional: NAD, calm, comfortable Respiratory: clear to auscultation bilaterally, no wheezing, scattered crackles throughout the mid to basilar lung fields bilaterally. Normal respiratory effort. No accessory muscle use.  Room air Cardiovascular: Irregular rate with underlying rhythm of atrial fibrillation, no murmurs / rubs / gallops.  2-3+ bilateral lower extremity edema. 2+ pedal pulses.   Abdomen: no tenderness, no masses palpated. No hepatosplenomegaly. Bowel sounds positive.  Musculoskeletal: no clubbing / cyanosis. No joint deformity upper and lower extremities. Good ROM except for difficulty/inability to perform flexion of left  hand poststroke, no contractures. Normal muscle tone.  Skin: no rashes, lesions, ulcers. No induration Neurologic: CN 2-12 grossly intact. Sensation intact, Strength 3-4/5 x all 4 extremities.  Unable to perform grip/flexion of left hand. Psychiatric: Normal judgment and insight. Alert and oriented x 3. Normal mood.    Data Reviewed:  Sodium 141, potassium 4.7, chloride 111, CO2 19, glucose 126, BUN 36, creatinine 2.43, GFR 25  BNP 856.3, troponin 27 and 30  WBC 6.3 with normal differential, hemoglobin 11.8, platelets 108,000  D-dimer was 0.57 in context of patient with CKD 4  Imaging as described in HPI  Assessment and Plan: Exacerbation of HFpEF No resting hypoxemia Has been given Lasix  40 mg IV x 1 but suspect with low GFR will need a higher dose therefore have ordered daily Lasix  60 mg IV with first dose to be given at 12 PM today Recent potassium 4.7-May need to supplement after administration of Lasix  Continue preadmission Jardiance , Toprol -XL, Imdur  and  Apresoline  Last echocardiogram in 2023 demonstrated an EF of 60 to 65% with diastolic parameters consistent with grade 1 diastolic dysfunction, mild MR-repeat echocardiogram this admission Daily weights with strict I/O Does have chronic lymphedema but current edema more consistent with heart failure noting is soft and pitting  Elevated troponin EKG nonischemic in etiology and troponin trend flat This is consistent with demand ischemia from heart failure exacerbation  Chronic atrial fibrillation Continue Eliquis  and Toprol -XL  Thrombocytopenia Current platelets 108,000 which is lower than baseline although he does have a history of intermittent thrombocytopenia with platelets as low as 129,000 Continue to follow platelets-likely secondary to Eliquis  use No signs of bleeding detected  Hypertension Medications as above Continue Catapres   CKD 4 Current parameters at baseline Does have some mild metabolic acidosis and could benefit from coadministration of regular oral sodium bicarbonate Follow-up with nephrology in the outpatient setting Dose adjust Lasix  to maximize diuresis as above  Diabetes mellitus 2 Follow CBGs and provide SSI On coadministration of Jardiance  for heart failure Hemoglobin A1c August 2024 was 6.9 Hemoglobin A1c this admission has been ordered  History of CVA Patient states is an outpatient rehabilitative therapies with [facility] PT evaluation this admission  BPH Monitor for urinary retention especially with administration of Lasix  Continue Proscar   History of gout Continue Zyloprim   HLD/CAD Continue statin and beta-blocker Continue Eliquis     Advance Care Planning:   Code Status: Limited: Do not attempt resuscitation (DNR) -DNR-LIMITED -Do Not Intubate/DNI    VTE prophylaxis: Eliquis   Consults: None  Family Communication: Patient only  Severity of Illness: The appropriate patient status for this patient is INPATIENT. Inpatient status is judged to  be reasonable and necessary in order to provide the required intensity of service to ensure the patient's safety. The patient's presenting symptoms, physical exam findings, and initial radiographic and laboratory data in the context of their chronic comorbidities is felt to place them at high risk for further clinical deterioration. Furthermore, it is not anticipated that the patient will be medically stable for discharge from the hospital within 2 midnights of admission.   * I certify that at the point of admission it is my clinical judgment that the patient will require inpatient hospital care spanning beyond 2 midnights from the point of admission due to high intensity of service, high risk for further deterioration and high frequency of surveillance required.*  Author: Isaiah Lever, NP 05/15/2024 9:36 AM  For on call review [email or web address].

## 2024-05-15 NOTE — ED Notes (Signed)
Report received from Gina RN

## 2024-05-16 DIAGNOSIS — I5033 Acute on chronic diastolic (congestive) heart failure: Secondary | ICD-10-CM | POA: Diagnosis not present

## 2024-05-16 LAB — BASIC METABOLIC PANEL WITH GFR
Anion gap: 12 (ref 5–15)
BUN: 37 mg/dL — ABNORMAL HIGH (ref 8–23)
CO2: 22 mmol/L (ref 22–32)
Calcium: 9.5 mg/dL (ref 8.9–10.3)
Chloride: 108 mmol/L (ref 98–111)
Creatinine, Ser: 2.26 mg/dL — ABNORMAL HIGH (ref 0.61–1.24)
GFR, Estimated: 28 mL/min — ABNORMAL LOW (ref >60)
Glucose, Bld: 100 mg/dL — ABNORMAL HIGH (ref 70–99)
Potassium: 3.9 mmol/L (ref 3.5–5.1)
Sodium: 142 mmol/L (ref 135–145)

## 2024-05-16 LAB — GLUCOSE, CAPILLARY
Glucose-Capillary: 109 mg/dL — ABNORMAL HIGH (ref 70–99)
Glucose-Capillary: 214 mg/dL — ABNORMAL HIGH (ref 70–99)
Glucose-Capillary: 105 mg/dL — ABNORMAL HIGH (ref 70–99)
Glucose-Capillary: 144 mg/dL — ABNORMAL HIGH (ref 70–99)

## 2024-05-16 MED ORDER — POTASSIUM CHLORIDE CRYS ER 20 MEQ PO TBCR
20.0000 meq | EXTENDED_RELEASE_TABLET | Freq: Once | ORAL | Status: AC
  Administered 2024-05-16: 20 meq via ORAL
  Filled 2024-05-16: qty 1

## 2024-05-16 MED ORDER — CARVEDILOL 6.25 MG PO TABS
6.2500 mg | ORAL_TABLET | Freq: Two times a day (BID) | ORAL | Status: DC
  Administered 2024-05-16 – 2024-05-19 (×6): 6.25 mg via ORAL
  Filled 2024-05-16 (×6): qty 1

## 2024-05-16 NOTE — Evaluation (Signed)
 Occupational Therapy Evaluation Patient Details Name: George Bolton MRN: [id number] DOB: 1941-06-25 Today's Date: 05/16/2024   History of Present Illness   George Bolton is a 83 y.o. male with medical history significant of HFpEF, atrial fibrillation on Eliquis , gout, hypertension, chronic lower extremity lymphedema, CKD 4, diabetes mellitus 2, prior history of stroke with mild right upper extremity deficits involving the hand. Presented to the ED via EMS reporting shortness of breath and difficulty breathing.   Clinical Impressions George Bolton was seen for OT evaluation this date. Prior to hospital admission, pt required +1 assist for transfers using sit to stand lift to w/c, assist for ADLs. Pt lives with spouse (unable to physically assist) in home with ramped entrance; CNA mornings/evenings and [facility] program during the day. Pt A&O x3, with encouragement eager to trial OOB.   Pt currently requires MOD A bed mobility, SBA for seated grooming tasks. Pt stood x2 from bed and x2 from STEDY flaps - achieves ~4 inch clearance of bottom from bed without any physical assist, ultimately requires MOD A to fully clear for STEDY flaps. SpO2 86% on RA with activity, resolved to 90% with prolonged rest. Pt would benefit from skilled OT to address noted impairments and functional limitations (see below for any additional details). Upon hospital discharge, recommend OT follow up with 24/7 SUPERVISION and assistance for all transfers.     If plan is discharge home, recommend the following:   Supervision due to cognitive status;Help with stairs or ramp for entrance;A lot of help with bathing/dressing/bathroom;A lot of help with walking and/or transfers     Functional Status Assessment   Patient has had a recent decline in their functional status and demonstrates the ability to make significant improvements in function in a reasonable and predictable amount of time.     Equipment Recommendations    Hospital bed     Recommendations for Other Services         Precautions/Restrictions   Precautions Precautions: Fall Recall of Precautions/Restrictions: Impaired Restrictions Weight Bearing Restrictions Per Provider Order: No     Mobility Bed Mobility Overal bed mobility: Needs Assistance Bed Mobility: Supine to Sit, Sit to Supine     Supine to sit: Mod assist Sit to supine: Mod assist        Transfers Overall transfer level: Needs assistance Equipment used: Ambulation equipment used Transfers: Sit to/from Stand Sit to Stand: Via lift equipment, Mod assist, From elevated surface           General transfer comment: stood x2 from bed and x2 from STEDY flaps - achieves ~4 inch clearance of bottom from bed without any physical assist, ultimately requires MOD A to fully clear for STEDY flaps.  Transfer via Lift Equipment: Stedy    Balance Overall balance assessment: Needs assistance Sitting-balance support: Bilateral upper extremity supported, Feet supported Sitting balance-Leahy Scale: Fair     Standing balance support: Bilateral upper extremity supported Standing balance-Leahy Scale: Poor                             ADL either performed or assessed with clinical judgement   ADL Overall ADL's : Needs assistance/impaired                                       General ADL Comments: MOD A for BSC t/f using STEDY.  SBA for seated grooming tasks.      Vision         Perception         Praxis         Pertinent Vitals/Pain Pain Assessment Pain Assessment: Faces Faces Pain Scale: Hurts little more Pain Location: scrotum Pain Descriptors / Indicators: Discomfort, Grimacing Pain Intervention(s): Limited activity within patient's tolerance, Repositioned     Extremity/Trunk Assessment Upper Extremity Assessment Upper Extremity Assessment: Generalized weakness   Lower Extremity Assessment Lower Extremity Assessment:  Generalized weakness       Communication Communication Communication: Impaired Factors Affecting Communication: Difficulty expressing self;Reduced clarity of speech   Cognition Arousal: Alert Behavior During Therapy: Flat affect Cognition: No family/caregiver present to determine baseline             OT - Cognition Comments: A&O x3                 Following commands: Impaired Following commands impaired: Follows one step commands with increased time     Cueing  General Comments   Cueing Techniques: Verbal cues;Tactile cues  SpO2 86% on RA with activity, resolved to 90% with rest   Exercises     Shoulder Instructions      Home Living Family/patient expects to be discharged to:: Private residence Living Arrangements: Spouse/significant other;Children Available Help at Discharge: Family Type of Home: House Home Access: Ramped entrance     Home Layout: One level     Bathroom Shower/Tub: Chief Strategy Officer: Standard Bathroom Accessibility: No   Home Equipment: Wheelchair - manual;Lift chair;Other (comment)   Additional Comments: sit to stand lift. [facility] participant. AM/PM aids and [facility] during the day      Prior Functioning/Environment Prior Level of Function : Needs assist             Mobility Comments: per [facility] OT: pt utilizes sit to stand lift for w/c t/fs and self-propels ADLs Comments: assist for ADLs    OT Problem List: Decreased strength;Decreased range of motion;Decreased activity tolerance;Impaired balance (sitting and/or standing)   OT Treatment/Interventions: Self-care/ADL training;Therapeutic exercise;Energy conservation;DME and/or AE instruction;Therapeutic activities;Patient/family education      OT Goals(Current goals can be found in the care plan section)   Acute Rehab OT Goals Patient Stated Goal: to stand OT Goal Formulation: With patient Time For Goal Achievement: 05/30/24 Potential to Achieve Goals:  Fair ADL Goals Pt Will Perform Grooming: with min assist;sitting Pt Will Perform Lower Body Dressing: sit to/from stand;with max assist Pt Will Transfer to Toilet: stand pivot transfer;with min assist (using sit to stand lift)   OT Frequency:  Min 3X/week    Co-evaluation              AM-PAC OT 6 Clicks Daily Activity     Outcome Measure Help from another person eating meals?: None Help from another person taking care of personal grooming?: A Little Help from another person toileting, which includes using toliet, bedpan, or urinal?: A Lot Help from another person bathing (including washing, rinsing, drying)?: A Lot Help from another person to put on and taking off regular upper body clothing?: A Little Help from another person to put on and taking off regular lower body clothing?: A Lot 6 Click Score: 16   End of Session Equipment Utilized During Treatment: Gait belt Nurse Communication: Mobility status  Activity Tolerance: Patient tolerated treatment well Patient left: in bed;with call bell/phone within reach;with bed alarm set  OT Visit Diagnosis:  Other abnormalities of gait and mobility (R26.89);Muscle weakness (generalized) (M62.81)                Time: 8944-8879 OT Time Calculation (min): 25 min Charges:  OT General Charges $OT Visit: 1 Visit OT Evaluation $OT Eval Moderate Complexity: 1 Mod OT Treatments $Self Care/Home Management : 8-22 mins  Elston Slot, M.S. OTR/L  05/16/24, 2:17 PM  ascom [phone]

## 2024-05-16 NOTE — Progress Notes (Signed)
 Progress Note   Patient: George Bolton FMW:[id number] DOB: [birth year] DOA: 05/15/2024     1 DOS: the patient was seen and examined on 05/16/2024   Brief hospital course: From HPI George Bolton is a 83 y.o. male with medical history significant of HFpEF, atrial fibrillation on Eliquis , gout, hypertension, chronic lower extremity lymphedema, CKD 4, diabetes mellitus 2, NSVT, prior history of stroke with mild right upper extremity deficits involving the hand, dyslipidemia, CAD and BPH.  Presented to the ED via EMS reporting shortness of breath and difficulty breathing.  Noted with lower extremity bilateral edema.  Paramedics report that pulmonary exam prior to arrival consistent with volume overload with diffuse crackles.  Resting room air sats were 96%.  Lower extremity venous duplex negative for DVT.  Chest x-ray demonstrated cardiomegaly but no frank interstitial edema.  CT of the chest revealed no edema but there were small bilateral pleural effusions and mild dependent atelectasis in the bilateral lower lobes.  BNP was elevated at 856, troponin mildly elevated at 27 and 30 with flat trend.  D-dimer slightly elevated at 0.56.  PCR for COVID, flu and RSV negative.  Room air sats have been variable between 93 and 100%.  Diastolic blood pressures have been elevated in the 90 range.  Hospitalist service has been asked to evaluate the patient for admission.    Assessment and Plan:  Exacerbation of HFpEF No resting hypoxemia Continue preadmission Jardiance , Toprol -XL, Imdur  and Apresoline  Last echocardiogram in 2023 demonstrated an EF of 60 to 65% with diastolic parameters consistent with grade 1 diastolic dysfunction, mild MR Follow-up on echo ordered Continue Lasix  Continue to monitor Daily weights with strict I/O Does have chronic lymphedema but current edema more consistent with heart failure noting is soft and pitting   Elevated troponin EKG nonischemic in etiology and troponin trend  flat This is consistent with demand ischemia from heart failure exacerbation   Chronic atrial fibrillation Continue Eliquis  and Toprol -XL   Thrombocytopenia Current platelets 108,000 which is lower than baseline although he does have a history of intermittent thrombocytopenia with platelets as low as 129,000 Continue to follow platelets-likely secondary to Eliquis  use No signs of bleeding detected   Hypertension Medications as above Continue Catapres    CKD 4 Current parameters at baseline Does have some mild metabolic acidosis and could benefit from coadministration of regular oral sodium bicarbonate Follow-up with nephrology in the outpatient setting Dose adjust Lasix  to maximize diuresis as above   Diabetes mellitus 2 Follow CBGs and provide SSI On coadministration of Jardiance  for heart failure Hemoglobin A1c August 2024 was 6.9 Follow-up A1c   History of CVA Continue statin  BPH Monitor for urinary retention especially with administration of Lasix  Continue Proscar    History of gout Continue Zyloprim    HLD/CAD Continue statin and beta-blocker Continue Eliquis        Advance Care Planning:   Code Status: Limited: Do not attempt resuscitation (DNR) -DNR-LIMITED -Do Not Intubate/DNI     VTE prophylaxis: Eliquis    Consults: None   Family Communication: Patient only   Subjective:  Patient seen and examined at bedside this morning Appears very lethargic He tells me he is just slightly better He denies chest pain nausea vomiting abdominal pain  Physical Exam: Constitutional: NAD, calm, comfortable Respiratory: clear to auscultation bilaterally, no wheezing, scattered crackles throughout the mid to basilar lung fields bilaterally. Normal respiratory effort. No accessory muscle use.  Room air Cardiovascular: Irregular rate with underlying rhythm of atrial fibrillation, no murmurs /  rubs / gallops.  2-3+ bilateral lower extremity edema. 2+ pedal pulses.    Abdomen: no tenderness, no masses palpated. No hepatosplenomegaly. Bowel sounds positive.  Musculoskeletal: no clubbing / cyanosis. No joint deformity upper and lower extremities. Good ROM except for difficulty/inability to perform flexion of left hand poststroke, no contractures. Normal muscle tone.  Skin: no rashes, lesions, ulcers. No induration Neurologic: CN 2-12 grossly intact. Sensation intact, Strength 3-4/5 x all 4 extremities.  Unable to perform grip/flexion of left hand. Psychiatric: Normal judgment and insight. Alert and oriented x 3. Normal mood.       Vitals:   05/16/24 0524 05/16/24 0802 05/16/24 1143 05/16/24 1516  BP: (!) 168/107 (!) 170/105 (!) 152/95 (!) 157/114  Pulse: 79 78  71  Resp:      Temp: (!) 97.5 F (36.4 C) 97.7 F (36.5 C) 97.8 F (36.6 C) 97.8 F (36.6 C)  TempSrc: Oral  Axillary   SpO2: 95% 94% 92% 97%  Weight:      Height:        Data Reviewed:  Chest x-ray reviewed by me showing cardiomegaly    Latest Ref Rng & Units 05/15/2024   12:30 AM 12/16/2023   12:24 PM 06/16/2023    4:22 AM  CBC  WBC 4.0 - 10.5 K/uL 6.3  7.6  9.7   Hemoglobin 13.0 - 17.0 g/dL 88.1  85.5  86.8   Hematocrit 39.0 - 52.0 % 38.1  47.1  38.7   Platelets 150 - 400 K/uL 108  197  146        Latest Ref Rng & Units 05/16/2024    4:42 AM 05/15/2024   12:30 AM 12/16/2023   12:24 PM  BMP  Glucose 70 - 99 mg/dL 899  873  834   BUN 8 - 23 mg/dL 37  36  43   Creatinine 0.61 - 1.24 mg/dL 7.73  7.56  7.73   Sodium 135 - 145 mmol/L 142  141  143   Potassium 3.5 - 5.1 mmol/L 3.9  4.7  3.8   Chloride 98 - 111 mmol/L 108  111  107   CO2 22 - 32 mmol/L 22  19  20    Calcium  8.9 - 10.3 mg/dL 9.5  9.4  9.6       Author: Drue ONEIDA Potter, MD 05/16/2024 3:56 PM  For on call review [email or web address].

## 2024-05-16 NOTE — TOC Progression Note (Signed)
 Transition of Care [facility]) - Progression Note    Patient Details  Name: George Bolton MRN: [id number] Date of Birth: 1940-12-12  [facility] CM/SW Contact  Tomasa JAYSON Childes, RN Phone Number: 05/16/2024, 4:00 PM  Clinical Narrative:    Retrieved message from Jenna, [place] nurse stating if patient is discharging tomorrow , [facility] will transport him as well as order his oxygen.                       Expected Discharge Plan and Services                                               Social Drivers of Health (SDOH) Interventions SDOH Screenings   Food Insecurity: No Food Insecurity (05/15/2024)  Housing: Low Risk  (05/15/2024)  Transportation Needs: No Transportation Needs (05/15/2024)  Utilities: Not At Risk (05/15/2024)  Social Connections: Moderately Isolated (05/15/2024)  Tobacco Use: Medium Risk (05/15/2024)    Readmission Risk Interventions    09/20/2022   10:22 AM  Readmission Risk Prevention Plan  Post Dischage Appt Complete  Medication Screening Complete  Transportation Screening Complete

## 2024-05-16 NOTE — Progress Notes (Signed)
 Physical Therapy Treatment Patient Details Name: George Bolton MRN: [id number] DOB: [birth year] Today's Date: 05/16/2024   History of Present Illness George Bolton is a 83 y.o. male with medical history significant of HFpEF, atrial fibrillation on Eliquis , gout, hypertension, chronic lower extremity lymphedema, CKD 4, diabetes mellitus 2, prior history of stroke with mild right upper extremity deficits involving the hand. Presented to the ED via EMS reporting shortness of breath and difficulty breathing.    PT Comments  Today's tx was an attempt to perform bed mobility with integration of stedy lift machine to improve overall function. Pt this date to be found very lethargic and unable to stay alert for an extended period of time. PT with attempts to get pt to roll sideways to hopefully sit up but pt unable to perform motion. In supine attempt to perform SLR to activate LE but pt unable to do so without max assist from PT. Pt overall continues to need 24/7 assistance at home to safely d/c with qualified aides to assist with mobility. D/t conversation with OT/[facility] representative, changing d/c recs to HHPT d/t [facility] providing assistance and care pt needs. PT to follow acutely as appropriate.     If plan is discharge home, recommend the following: A lot of help with walking and/or transfers;A lot of help with bathing/dressing/bathroom;Assist for transportation;Help with stairs or ramp for entrance;Supervision due to cognitive status   Can travel by private vehicle     No  Equipment Recommendations  None recommended by PT    Recommendations for Other Services       Precautions / Restrictions Precautions Precautions: Fall Recall of Precautions/Restrictions: Impaired Restrictions Weight Bearing Restrictions Per Provider Order: No     Mobility  Bed Mobility Overal bed mobility: Needs Assistance Bed Mobility: Rolling           General bed mobility comments: attempt to roll, pt unable  to maintain eyes open/maintain attention for more than couple of seconds, attempt to perform SLR to activate LEs but pt unable to do without max assist from PT    Transfers                   General transfer comment: Not done this date    Ambulation/Gait               General Gait Details: pt non-ambulatory at baseline   Stairs             Wheelchair Mobility     Tilt Bed    Modified Rankin (Stroke Patients Only)       Balance       Sitting balance - Comments: unable to test this date       Standing balance comment: unable to test this date                            Communication Communication Communication: Impaired Factors Affecting Communication: Difficulty expressing self;Reduced clarity of speech  Cognition Arousal: Lethargic Behavior During Therapy: Flat affect   PT - Cognitive impairments: Difficult to assess, Orientation, Awareness, No family/caregiver present to determine baseline Difficult to assess due to: Impaired communication Orientation impairments: Time, Situation, Place                   PT - Cognition Comments: continues to only be orientated to self and year Following commands: Impaired Following commands impaired: Follows one step commands with increased time  Cueing Cueing Techniques: Verbal cues, Tactile cues  Exercises      General Comments General comments (skin integrity, edema, etc.): SpO2 86% on RA with activity, resolved to 90% with rest      Pertinent Vitals/Pain Pain Assessment Pain Assessment: No/denies pain    Home Living Family/patient expects to be discharged to:: Private residence Living Arrangements: Spouse/significant other;Children Available Help at Discharge: Family Type of Home: House Home Access: Ramped entrance       Home Layout: One level Home Equipment: Wheelchair - manual;Lift chair;Other (comment) Additional Comments: sit to stand lift. [facility] participant.  AM/PM aids and [facility] during the day    Prior Function            PT Goals (current goals can now be found in the care plan section) Acute Rehab PT Goals Patient Stated Goal: to feel better and get stronger PT Goal Formulation: With patient Time For Goal Achievement: 05/29/24 Potential to Achieve Goals: Good Progress towards PT goals: Progressing toward goals    Frequency    Min 2X/week      PT Plan      Co-evaluation              AM-PAC PT 6 Clicks Mobility   Outcome Measure  Help needed turning from your back to your side while in a flat bed without using bedrails?: A Lot Help needed moving from lying on your back to sitting on the side of a flat bed without using bedrails?: A Lot Help needed moving to and from a bed to a chair (including a wheelchair)?: Total Help needed standing up from a chair using your arms (e.g., wheelchair or bedside chair)?: Total Help needed to walk in hospital room?: Total Help needed climbing 3-5 steps with a railing? : Total 6 Click Score: 8    End of Session   Activity Tolerance: Patient limited by fatigue Patient left: in bed;with call bell/phone within reach;with bed alarm set Nurse Communication: Mobility status PT Visit Diagnosis: Muscle weakness (generalized) (M62.81);Other abnormalities of gait and mobility (R26.89)     Time: 8565-8557 PT Time Calculation (min) (ACUTE ONLY): 8 min  Charges:                             Mahlani Berninger Romero-Perozo, SPT  05/16/2024, 2:52 PM

## 2024-05-17 ENCOUNTER — Inpatient Hospital Stay

## 2024-05-17 DIAGNOSIS — I5041 Acute combined systolic (congestive) and diastolic (congestive) heart failure: Secondary | ICD-10-CM

## 2024-05-17 DIAGNOSIS — I10 Essential (primary) hypertension: Secondary | ICD-10-CM | POA: Diagnosis not present

## 2024-05-17 LAB — CBC WITH DIFFERENTIAL/PLATELET
Abs Immature Granulocytes: 0.02 K/uL (ref 0.00–0.07)
Basophils Absolute: 0 K/uL (ref 0.0–0.1)
Basophils Relative: 0 %
Eosinophils Absolute: 0 K/uL (ref 0.0–0.5)
Eosinophils Relative: 0 %
HCT: 38.9 % — ABNORMAL LOW (ref 39.0–52.0)
Hemoglobin: 12.5 g/dL — ABNORMAL LOW (ref 13.0–17.0)
Immature Granulocytes: 0 %
Lymphocytes Relative: 12 %
Lymphs Abs: 0.8 K/uL (ref 0.7–4.0)
MCH: 27.8 pg (ref 26.0–34.0)
MCHC: 32.1 g/dL (ref 30.0–36.0)
MCV: 86.6 fL (ref 80.0–100.0)
Monocytes Absolute: 0.7 K/uL (ref 0.1–1.0)
Monocytes Relative: 11 %
Neutro Abs: 5.1 K/uL (ref 1.7–7.7)
Neutrophils Relative %: 77 %
Platelets: 138 K/uL — ABNORMAL LOW (ref 150–400)
RBC: 4.49 MIL/uL (ref 4.22–5.81)
RDW: 16.8 % — ABNORMAL HIGH (ref 11.5–15.5)
WBC: 6.7 K/uL (ref 4.0–10.5)
nRBC: 0 % (ref 0.0–0.2)

## 2024-05-17 LAB — BASIC METABOLIC PANEL WITH GFR
Anion gap: 11 (ref 5–15)
BUN: 38 mg/dL — ABNORMAL HIGH (ref 8–23)
CO2: 19 mmol/L — ABNORMAL LOW (ref 22–32)
Calcium: 9.4 mg/dL (ref 8.9–10.3)
Chloride: 110 mmol/L (ref 98–111)
Creatinine, Ser: 1.98 mg/dL — ABNORMAL HIGH (ref 0.61–1.24)
GFR, Estimated: 32 mL/min — ABNORMAL LOW (ref >60)
Glucose, Bld: 102 mg/dL — ABNORMAL HIGH (ref 70–99)
Potassium: 4.1 mmol/L (ref 3.5–5.1)
Sodium: 140 mmol/L (ref 135–145)

## 2024-05-17 LAB — GLUCOSE, CAPILLARY
Glucose-Capillary: 100 mg/dL — ABNORMAL HIGH (ref 70–99)
Glucose-Capillary: 158 mg/dL — ABNORMAL HIGH (ref 70–99)
Glucose-Capillary: 94 mg/dL (ref 70–99)
Glucose-Capillary: 129 mg/dL — ABNORMAL HIGH (ref 70–99)

## 2024-05-17 MED ORDER — ROSUVASTATIN CALCIUM 10 MG PO TABS
10.0000 mg | ORAL_TABLET | Freq: Every day | ORAL | Status: DC
  Administered 2024-05-18 – 2024-05-19 (×2): 10 mg via ORAL
  Filled 2024-05-17 (×2): qty 1

## 2024-05-17 NOTE — Plan of Care (Signed)
   Problem: Education: Goal: Knowledge of General Education information will improve Description Including pain rating scale, medication(s)/side effects and non-pharmacologic comfort measures Outcome: Progressing

## 2024-05-17 NOTE — Progress Notes (Signed)
 Occupational Therapy Treatment Patient Details Name: George Bolton MRN: [id number] DOB: January 13, 1941 Today's Date: 05/17/2024   History of present illness George Bolton is a 83 y.o. male with medical history significant of HFpEF, atrial fibrillation on Eliquis , gout, hypertension, chronic lower extremity lymphedema, CKD 4, diabetes mellitus 2, prior history of stroke with mild right upper extremity deficits involving the hand. Presented to the ED via EMS reporting shortness of breath and difficulty breathing.   OT comments  Mr Halteman seen for OT treatment on this date. Upon arrival to room pt in bed, agreeable to tx. Pt requires MOD A +2 physical assist for supine>sit. Pt performed STS x4 using Stedy requiring MIN A +2 physical assist for first, requiring MOD A +2 for subsequent attempts w/ fatigue. Pt demonstrated increased WOB w/ mobility, SpO2 93% on RA w/ activity. Gown change performed seated EOB, requiring MIN A; SpO2 87% on RA at rest, RN notified. Pt making progress toward goals, will continue to follow POC. Discharge recommendation remains appropriate.        If plan is discharge home, recommend the following:  Supervision due to cognitive status;Help with stairs or ramp for entrance;A lot of help with bathing/dressing/bathroom;A lot of help with walking and/or transfers   Equipment Recommendations  Hospital bed    Recommendations for Other Services      Precautions / Restrictions Precautions Precautions: Fall Recall of Precautions/Restrictions: Impaired Restrictions Weight Bearing Restrictions Per Provider Order: No       Mobility Bed Mobility Overal bed mobility: Needs Assistance Bed Mobility: Supine to Sit, Sit to Supine     Supine to sit: Mod assist, +2 for physical assistance Sit to supine: Max assist, +2 for physical assistance        Transfers Overall transfer level: Needs assistance Equipment used: Ambulation equipment used Transfers: Sit to/from  Stand Sit to Stand: Mod assist, +2 physical assistance, Via lift equipment             Transfer via Lift Equipment: Stedy   Balance Overall balance assessment: Needs assistance Sitting-balance support: No upper extremity supported, Feet supported Sitting balance-Leahy Scale: Fair     Standing balance support: Bilateral upper extremity supported Standing balance-Leahy Scale: Poor                             ADL either performed or assessed with clinical judgement   ADL Overall ADL's : Needs assistance/impaired                                       General ADL Comments: MIN A for gown change    Extremity/Trunk Assessment Upper Extremity Assessment Upper Extremity Assessment: Generalized weakness   Lower Extremity Assessment Lower Extremity Assessment: Generalized weakness        Vision       Perception     Praxis     Communication Communication Communication: Impaired Factors Affecting Communication: Reduced clarity of speech   Cognition Arousal: Alert Behavior During Therapy: WFL for tasks assessed/performed, Flat affect Cognition: No family/caregiver present to determine baseline                               Following commands: Impaired Following commands impaired: Follows one step commands inconsistently, Follows one step commands with increased time  Cueing   Cueing Techniques: Verbal cues, Gestural cues, Tactile cues  Exercises      Shoulder Instructions       General Comments SpO2 93% on RA w/ activity, 87% on RA at rest    Pertinent Vitals/ Pain       Pain Assessment Pain Assessment: Faces Faces Pain Scale: Hurts a little bit Pain Location: B knees Pain Descriptors / Indicators: Discomfort, Grimacing Pain Intervention(s): Limited activity within patient's tolerance, Monitored during session  Home Living                                          Prior  Functioning/Environment              Frequency  Min 3X/week        Progress Toward Goals  OT Goals(current goals can now be found in the care plan section)  Progress towards OT goals: Progressing toward goals  Acute Rehab OT Goals Patient Stated Goal: to get stronger OT Goal Formulation: With patient Time For Goal Achievement: 05/31/24 Potential to Achieve Goals: Fair ADL Goals Pt Will Perform Grooming: with min assist;sitting Pt Will Perform Lower Body Dressing: sit to/from stand;with max assist Pt Will Transfer to Toilet: stand pivot transfer;with min assist  Plan      Co-evaluation                 AM-PAC OT 6 Clicks Daily Activity     Outcome Measure   Help from another person eating meals?: None Help from another person taking care of personal grooming?: A Little Help from another person toileting, which includes using toliet, bedpan, or urinal?: A Lot Help from another person bathing (including washing, rinsing, drying)?: A Lot Help from another person to put on and taking off regular upper body clothing?: A Little Help from another person to put on and taking off regular lower body clothing?: A Lot 6 Click Score: 16    End of Session Equipment Utilized During Treatment: Gait belt;Other (comment) Laurent)  OT Visit Diagnosis: Other abnormalities of gait and mobility (R26.89);Muscle weakness (generalized) (M62.81)   Activity Tolerance Patient tolerated treatment well;Patient limited by fatigue   Patient Left in bed;with call bell/phone within reach;with bed alarm set   Nurse Communication Other (comment) (SpO2 87% @ end of session)        Time: 8591-8566 OT Time Calculation (min): 25 min  Charges: OT General Charges $OT Visit: 1 Visit OT Treatments $Self Care/Home Management : 8-22 mins $Therapeutic Activity: 8-22 mins  [place]ston Shropshire, Student OT   [employer] 05/17/2024, 4:27 PM

## 2024-05-17 NOTE — Progress Notes (Signed)
 Progress Note   Patient: George Bolton FMW:[id number] DOB: 1941/05/15 DOA: 05/15/2024     2 DOS: the patient was seen and examined on 05/17/2024   Brief hospital course: From HPI LEXUS BARLETTA is a 83 y.o. male with medical history significant of HFpEF, atrial fibrillation on Eliquis , gout, hypertension, chronic lower extremity lymphedema, CKD 4, diabetes mellitus 2, NSVT, prior history of stroke with mild right upper extremity deficits involving the hand, dyslipidemia, CAD and BPH.  Presented to the ED via EMS reporting shortness of breath and difficulty breathing.  Noted with lower extremity bilateral edema.  Paramedics report that pulmonary exam prior to arrival consistent with volume overload with diffuse crackles.  Resting room air sats were 96%.  Lower extremity venous duplex negative for DVT.  Chest x-ray demonstrated cardiomegaly but no frank interstitial edema.  CT of the chest revealed no edema but there were small bilateral pleural effusions and mild dependent atelectasis in the bilateral lower lobes.  BNP was elevated at 856, troponin mildly elevated at 27 and 30 with flat trend.  D-dimer slightly elevated at 0.56.  PCR for COVID, flu and RSV negative.  Room air sats have been variable between 93 and 100%.  Diastolic blood pressures have been elevated in the 90 range.  Patient was admitted for acute on chronic CHF.  7/23: Hemodynamically stable, improving renal function with creatinine at 1.98 today's-continuing IV diuresis for another day.  Son concern of difficulty swallowing and a new stroke-CT head was negative for any acute abnormality, did show moderate diffuse chronic encephalomalacia. Echocardiogram with new diagnosis of HFrEF, EF of 45 to 50%-consulting cardiology.  Assessment and Plan:  Exacerbation of HFrEF No resting hypoxemia Continue preadmission Jardiance , Toprol -XL, Imdur  and Apresoline  Last echocardiogram in 2023 demonstrated an EF of 60 to 65% with diastolic  parameters consistent with grade 1 diastolic dysfunction, mild MR Repeat echocardiogram with decreased EF of 45 to 50%, indeterminate diastolic dysfunction, moderately reduced RV systolic function and mildly elevated pulmonary arterial pressure, severely dilated left atrium and dilated IVC. Continue IV Lasix  Continue to monitor Daily weights with strict I/O Daily BMP   Elevated troponin EKG nonischemic in etiology and troponin trend flat This is consistent with demand ischemia from heart failure exacerbation   Chronic atrial fibrillation Continue Eliquis  and Toprol -XL   Thrombocytopenia Improving and around baseline, platelets of 138 today patient is Continue to follow platelets-likely secondary to Eliquis  use No signs of bleeding detected   Hypertension Blood pressure mildly elevated. - Continue current medications   CKD 4 Current parameters at baseline Does have some mild metabolic acidosis and could benefit from coadministration of regular oral sodium bicarbonate Follow-up with nephrology in the outpatient setting   Diabetes mellitus 2 Follow CBGs and provide SSI On coadministration of Jardiance  for heart failure Hemoglobin A1c August 2024 was 6.9 Repeat A1c on 7/21 was 6.2   History of CVA Continue statin Repeat CT head today was negative for any acute abnormality  BPH Monitor for urinary retention especially with administration of Lasix  Continue Proscar    History of gout Continue Zyloprim    HLD/CAD Continue statin and beta-blocker Continue Eliquis      Advance Care Planning:   Code Status: Limited: Do not attempt resuscitation (DNR) -DNR-LIMITED -Do Not Intubate/DNI     VTE prophylaxis: Eliquis    Consults: Cardiology   Family Communication: Talked with son on phone.   Subjective:  Patient was seen and examined today.  Denies any chest pain or shortness of breath.  At  baseline he is wheelchair and bedbound for about a year after his tibial  fracture.  Physical Exam: General.  Frail elderly man, in no acute distress. Pulmonary.  Lungs clear bilaterally, normal respiratory effort. CV.  Regular rate and rhythm, no JVD, rub or murmur. Abdomen.  Soft, nontender, nondistended, BS positive. CNS.  Alert and oriented .  No focal neurologic deficit. Extremities.  No edema, pulses intact and symmetrical. Psychiatry.  Judgment and insight appears normal.        Vitals:   05/17/24 0500 05/17/24 0755 05/17/24 1059 05/17/24 1538  BP:  (!) 183/109 124/84 (!) 161/114  Pulse:  74 82 82  Resp:   20   Temp:  97.7 F (36.5 C) 97.7 F (36.5 C) 98 F (36.7 C)  TempSrc:      SpO2:  94% 96% 95%  Weight: 72.7 kg     Height:        Data Reviewed:  Chest x-ray reviewed by me showing cardiomegaly    Latest Ref Rng & Units 05/17/2024    4:38 AM 05/15/2024   12:30 AM 12/16/2023   12:24 PM  CBC  WBC 4.0 - 10.5 K/uL 6.7  6.3  7.6   Hemoglobin 13.0 - 17.0 g/dL 87.4  88.1  85.5   Hematocrit 39.0 - 52.0 % 38.9  38.1  47.1   Platelets 150 - 400 K/uL 138  108  197        Latest Ref Rng & Units 05/17/2024    4:38 AM 05/16/2024    4:42 AM 05/15/2024   12:30 AM  BMP  Glucose 70 - 99 mg/dL 897  899  873   BUN 8 - 23 mg/dL 38  37  36   Creatinine 0.61 - 1.24 mg/dL 8.01  7.73  7.56   Sodium 135 - 145 mmol/L 140  142  141   Potassium 3.5 - 5.1 mmol/L 4.1  3.9  4.7   Chloride 98 - 111 mmol/L 110  108  111   CO2 22 - 32 mmol/L 19  22  19    Calcium  8.9 - 10.3 mg/dL 9.4  9.5  9.4     This record has been created using Conservation officer, historic buildings. Errors have been sought and corrected,but may not always be located. Such creation errors do not reflect on the standard of care.   Author: Amaryllis Dare, MD 05/17/2024 4:30 PM  For on call review [email or web address].

## 2024-05-17 NOTE — TOC Progression Note (Addendum)
 Transition of Care [facility]) - Progression Note    Patient Details  Name: George Bolton MRN: [id number] Date of Birth: Sep 02, 1941  Transition [facility]) CM/SW Contact  Tomasa JAYSON Childes, RN Phone Number: 05/17/2024, 9:54 AM  Clinical Narrative:    Beatris with Jenna [facility] Nurse regarding discharge. She was advised MD would provide an update shortly regarding discharge.   11:27am Per MD patient will require another day of IV Lasix . [facility] nurse, Jenna updated. Jenna also stated patient's family had contacted her with concerns about patient having a CVA. MD made aware of patient's family concerns.                       Expected Discharge Plan and Services                                               Social Drivers of Health (SDOH) Interventions SDOH Screenings   Food Insecurity: No Food Insecurity (05/15/2024)  Housing: Low Risk  (05/15/2024)  Transportation Needs: No Transportation Needs (05/15/2024)  Utilities: Not At Risk (05/15/2024)  Social Connections: Moderately Isolated (05/15/2024)  Tobacco Use: Medium Risk (05/15/2024)    Readmission Risk Interventions    09/20/2022   10:22 AM  Readmission Risk Prevention Plan  Post Dischage Appt Complete  Medication Screening Complete  Transportation Screening Complete

## 2024-05-17 NOTE — Progress Notes (Signed)
 Heart Failure Navigator Progress Note  Assessed for Heart & Vascular TOC clinic readiness.  Patient will be returning to the [facility] ([facility]) program at discharge.  Navigator will sign of at this time.  Charmaine Pines, RN, BSN [facility] Heart Failure Navigator Secure Chat Only

## 2024-05-17 NOTE — TOC Progression Note (Signed)
..  Transition of Care [facility]) - Inpatient Brief Assessment   Patient Details  Name: George Bolton MRN: [id number] Date of Birth: 09/26/1941  Transition of Care [facility]) CM/SW Contact:    Edsel DELENA Fischer, LCSW Phone Number: 05/17/2024, 11:03 AM   Clinical Narrative:  SW received message from Taylor, case worker, with [facility] at [phone] to giver her call back regarding pt. SW called Ellouise back. No answer and went to vm. SW left vm.  SW notified Tomasa Childes of call and provided Economist  Transition of Care Asessment:

## 2024-05-18 DIAGNOSIS — I5041 Acute combined systolic (congestive) and diastolic (congestive) heart failure: Secondary | ICD-10-CM | POA: Diagnosis not present

## 2024-05-18 DIAGNOSIS — I10 Essential (primary) hypertension: Secondary | ICD-10-CM | POA: Diagnosis not present

## 2024-05-18 LAB — CBC
HCT: 40.4 % (ref 39.0–52.0)
Hemoglobin: 13 g/dL (ref 13.0–17.0)
MCH: 28.6 pg (ref 26.0–34.0)
MCHC: 32.2 g/dL (ref 30.0–36.0)
MCV: 88.8 fL (ref 80.0–100.0)
Platelets: 113 K/uL — ABNORMAL LOW (ref 150–400)
RBC: 4.55 MIL/uL (ref 4.22–5.81)
RDW: 17.2 % — ABNORMAL HIGH (ref 11.5–15.5)
WBC: 6.8 K/uL (ref 4.0–10.5)
nRBC: 0 % (ref 0.0–0.2)

## 2024-05-18 LAB — BASIC METABOLIC PANEL WITH GFR
Anion gap: 12 (ref 5–15)
BUN: 41 mg/dL — ABNORMAL HIGH (ref 8–23)
CO2: 22 mmol/L (ref 22–32)
Calcium: 9.6 mg/dL (ref 8.9–10.3)
Chloride: 109 mmol/L (ref 98–111)
Creatinine, Ser: 2.1 mg/dL — ABNORMAL HIGH (ref 0.61–1.24)
GFR, Estimated: 30 mL/min — ABNORMAL LOW (ref >60)
Glucose, Bld: 109 mg/dL — ABNORMAL HIGH (ref 70–99)
Potassium: 4.6 mmol/L (ref 3.5–5.1)
Sodium: 143 mmol/L (ref 135–145)

## 2024-05-18 LAB — GLUCOSE, CAPILLARY
Glucose-Capillary: 103 mg/dL — ABNORMAL HIGH (ref 70–99)
Glucose-Capillary: 163 mg/dL — ABNORMAL HIGH (ref 70–99)
Glucose-Capillary: 110 mg/dL — ABNORMAL HIGH (ref 70–99)
Glucose-Capillary: 121 mg/dL — ABNORMAL HIGH (ref 70–99)

## 2024-05-18 MED ORDER — TORSEMIDE 20 MG PO TABS
20.0000 mg | ORAL_TABLET | Freq: Every day | ORAL | Status: DC
  Administered 2024-05-18 – 2024-05-19 (×2): 20 mg via ORAL
  Filled 2024-05-18 (×2): qty 1

## 2024-05-18 MED ORDER — LOSARTAN POTASSIUM 25 MG PO TABS: 25.0000 mg | ORAL_TABLET | Freq: Every day | ORAL | Status: DC

## 2024-05-18 MED ORDER — HYDRALAZINE HCL 25 MG PO TABS
25.0000 mg | ORAL_TABLET | Freq: Three times a day (TID) | ORAL | Status: AC
  Administered 2024-05-18 (×2): 25 mg via ORAL
  Filled 2024-05-18 (×2): qty 1

## 2024-05-18 MED ORDER — LOSARTAN POTASSIUM 50 MG PO TABS
50.0000 mg | ORAL_TABLET | Freq: Every day | ORAL | Status: DC
  Administered 2024-05-19: 50 mg via ORAL
  Filled 2024-05-18: qty 1

## 2024-05-18 NOTE — Consult Note (Addendum)
 [facility] CLINIC CARDIOLOGY CONSULT NOTE       Patient ID: George Bolton MRN: [id number] DOB/AGE: 1941/05/04 83 y.o.  Admit date: 05/15/2024 Referring Physician Dr. Caleen Primary Physician Cecillia Landry RAMAN, MD Primary Cardiologist Dr. Florencio Reason for Consultation Newly reduced EF  HPI: George Bolton is a 83 y.o. male  from [facility], bedbound with a past medical history of chronic HFpEF, coronary artery disease, paroxsymal atrial fibrillation, hx rheumatic fever, hx CVA, hypertension, hyperlipidemia, CKD, BPH, lymphedema, chronic gout  who presented to the ED on 05/15/2024 for worsening shortness of breath, lower extremity edema and decreased UOP. Echo this admission revealed newly reduced EF of 45-50%. Cardiology was consulted for further evaluation.   Patient presented to the ED with worsening shortness of breath, lower extremity edema and decreased UOP. Work up in the ED notable for Na 141, K 4.7, Cr 2.43, GFR 25, Hgb 11.8, plts 108. Trops minimally elevated and flat 27 > 30. EKG with atrial fibrillation, rate 80 bpm without acute ischemic changes. BNP elevated at 850. CXR with cardiomegaly. CT chest with small bilateral pleural effusions. Patient started on IV lasix  60 mg daily for the past 3 days. Resumed on home cardiac medications.  At the time of my evaluation this AM, patient was resting comfortably in hospital bed with son at bedside. We discussed patients symptoms in further detail. Patient is a poor historian so son provided most of the history. Son reports patient came home from facility on on 07/17 he noticed that the patient wasn't producing any urine. Patient takes PO lasix  at home for years. The next day patient became short of breath that continued to worsen. Also reported lower extremity swelling. Patient denies any chest pain or palpitations. At the time of my evaluation patient has diuresed well over the past few days, bilateral legs with no edema  and wrinkled. Patient denies any SOB or chest pain. Patient appears near euvolemia.    Review of systems complete and found to be negative unless listed above    Past Medical History:  Diagnosis Date   Acute embolic stroke (HCC) 11/26/2021   Chronic atrial fibrillation (HCC)    a. Afib dates back to at least 2002 when reviewing prior EKG with EKGs from 2014 onward showing Afib; b. CHADS2VASc at least 4 (CHF, HTN, age x 2)   Chronic diastolic CHF (congestive heart failure) (HCC)    a. TTE 5/19: EF 70-75%, mod LVH, near complete obliteration of the mid and apical LV cavity (can be seen in apical hypertrophic CM), mildly dilated LA, RV cavity size nl, RV wall thickness nl, RVSF mildly reduced, mildly dilated RA; b. 02/2018 Limited echo w/ definity : EF 50-65%, no apical hypertrophic CM.   Community acquired bilateral lower lobe pneumonia 11/23/2021   Goals of care, counseling/discussion 12/11/2021   Gout    Hypertension    Hypertensive emergency 12/10/2021   Hypertensive urgency 07/16/2022    Past Surgical History:  Procedure Laterality Date   CHOLECYSTECTOMY N/A 01/13/2017   Procedure: LAPAROSCOPIC CHOLECYSTECTOMY;  Surgeon: Laneta JULIANNA Luna, MD;  Location: [facility] ORS;  Service: General;  Laterality: N/A;    Medications Prior to Admission  Medication Sig Dispense Refill Last Dose/Taking   acetaminophen  (TYLENOL ) 650 MG CR tablet Take 1,300 mg by mouth 2 (two) times daily.   05/14/2024   allopurinol  (ZYLOPRIM ) 100 MG tablet Take 1 tablet (100 mg total) by mouth daily. Home med.   05/14/2024   apixaban  (ELIQUIS )  2.5 MG TABS tablet Take 2.5 mg by mouth 2 (two) times daily.   05/14/2024   bisacodyl  (DULCOLAX) 5 MG EC tablet Take 2 tablets (10 mg total) by mouth at bedtime. Skip the dose if no constipation   Unknown   carvedilol  (COREG ) 12.5 MG tablet Take 12.5 mg by mouth 2 (two) times daily. Take along with one 6.25 mg tablet for total 18.75 mg twice daily   05/14/2024   carvedilol  (COREG ) 6.25 MG  tablet Take 6.25 mg by mouth 2 (two) times daily. Take along with one 12.5 mg tablet for total 18.75 mg twice daily   05/14/2024   cloNIDine  (CATAPRES  - DOSED IN MG/24 HR) 0.3 mg/24hr patch Place 0.3 mg onto the skin once a week.   05/08/2024   empagliflozin  (JARDIANCE ) 10 MG TABS tablet Take 10 mg by mouth daily.   05/14/2024   finasteride  (PROSCAR ) 5 MG tablet Take 1 tablet (5 mg total) by mouth daily. 30 tablet 11 05/14/2024   furosemide  (LASIX ) 40 MG tablet Take 40 mg by mouth daily.   05/14/2024   hydrALAZINE  (APRESOLINE ) 50 MG tablet Take 50 mg by mouth 3 (three) times daily.   05/14/2024   isosorbide  mononitrate (IMDUR ) 30 MG 24 hr tablet Take 1 tablet (30 mg total) by mouth daily. Hold if SBP <130 mmHg   05/14/2024   nystatin  (MYCOSTATIN /NYSTOP ) powder Apply 1 Application topically 2 (two) times daily.   Unknown   polyethylene glycol (MIRALAX  / GLYCOLAX ) 17 g packet Take 17 g by mouth 2 (two) times daily. Skip the dose if no constipation   Unknown   rosuvastatin  (CRESTOR ) 20 MG tablet Take 1 tablet (20 mg total) by mouth daily. 30 tablet 0 05/14/2024   fexofenadine (ALLEGRA) 180 MG tablet Take 180 mg by mouth daily. (Patient not taking: Reported on 05/17/2024)   Not Taking   metoprolol  succinate (TOPROL -XL) 100 MG 24 hr tablet Take 1 tablet (100 mg total) by mouth every evening. Take with or immediately following a meal.  Home med. (Patient not taking: Reported on 05/17/2024)   Not Taking   mirtazapine  (REMERON ) 7.5 MG tablet Take 7.5 mg by mouth at bedtime. (Patient not taking: Reported on 05/17/2024)   Not Taking   traMADol  (ULTRAM ) 50 MG tablet Take 50 mg by mouth 2 (two) times daily. (Patient not taking: Reported on 05/17/2024)   Not Taking   Social History   Socioeconomic History   Marital status: Married    Spouse name: Not on file   Number of children: Not on file   Years of education: Not on file   Highest education level: Not on file  Occupational History   Not on file  Tobacco Use    Smoking status: Former    Types: Cigarettes   Smokeless tobacco: Never  Vaping Use   Vaping status: Never Used  Substance and Sexual Activity   Alcohol use: No   Drug use: No   Sexual activity: Never  Other Topics Concern   Not on file  Social History Narrative   Not on file   Social Drivers of Health   Financial Resource Strain: Not on file  Food Insecurity: No Food Insecurity (05/15/2024)   Hunger Vital Sign    Worried About Running Out of Food in the Last Year: Never true    Ran Out of Food in the Last Year: Never true  Transportation Needs: No Transportation Needs (05/15/2024)   PRAPARE - Administrator, Civil Service (Medical):  No    Lack of Transportation (Non-Medical): No  Physical Activity: Not on file  Stress: Not on file  Social Connections: Moderately Isolated (05/15/2024)   Social Connection and Isolation Panel    Frequency of Communication with Friends and Family: More than three times a week    Frequency of Social Gatherings with Friends and Family: More than three times a week    Attends Religious Services: Never    Database administrator or Organizations: No    Attends Banker Meetings: Never    Marital Status: Married  Catering manager Violence: Not At Risk (05/15/2024)   Humiliation, Afraid, Rape, and Kick questionnaire    Fear of Current or Ex-Partner: No    Emotionally Abused: No    Physically Abused: No    Sexually Abused: No    Family History  Problem Relation Age of Onset   Stroke Mother      Vitals:   05/17/24 2009 05/18/24 0018 05/18/24 0400 05/18/24 0720  BP: (!) 181/99 (!) 166/91 (!) 173/88 (!) 170/93  Pulse: 92 81 87 84  Resp: 20 (!) 22 20 16   Temp: 98.5 F (36.9 C) 98.2 F (36.8 C) 98.3 F (36.8 C) 97.6 F (36.4 C)  TempSrc: Axillary Axillary Axillary Oral  SpO2: 94% 95% 96% 99%  Weight:   72.6 kg   Height:        PHYSICAL EXAM General: Chronically ill appearing elderly male, well nourished, in no acute  distress. HEENT: Normocephalic and atraumatic. Neck: No JVD.   Lungs: Normal respiratory effort on room air. Clear, diminished bilaterally.  Heart: irregularly, irregular, controlled rate. Normal S1 and S2 without gallops or murmurs.  Abdomen: Non-distended appearing.  Msk: Normal strength and tone for age. Extremities: Warm and well perfused. No clubbing, cyanosis, trace pedal edema.  Neuro: Alert and oriented X 3. Psych: Answers questions appropriately.   Labs: Basic Metabolic Panel: Recent Labs    05/17/24 0438 05/18/24 0307  NA 140 143  K 4.1 4.6  CL 110 109  CO2 19* 22  GLUCOSE 102* 109*  BUN 38* 41*  CREATININE 1.98* 2.10*  CALCIUM  9.4 9.6   Liver Function Tests: No results for input(s): AST, ALT, ALKPHOS, BILITOT, PROT, ALBUMIN in the last 72 hours. No results for input(s): LIPASE, AMYLASE in the last 72 hours. CBC: Recent Labs    05/17/24 0438 05/18/24 0307  WBC 6.7 6.8  NEUTROABS 5.1  --   HGB 12.5* 13.0  HCT 38.9* 40.4  MCV 86.6 88.8  PLT 138* 113*   Cardiac Enzymes: No results for input(s): CKTOTAL, CKMB, CKMBINDEX, TROPONINIHS in the last 72 hours. BNP: No results for input(s): BNP in the last 72 hours. D-Dimer: No results for input(s): DDIMER in the last 72 hours. Hemoglobin A1C: Recent Labs    05/15/24 1111  HGBA1C 6.2*   Fasting Lipid Panel: No results for input(s): CHOL, HDL, LDLCALC, TRIG, CHOLHDL, LDLDIRECT in the last 72 hours. Thyroid Function Tests: No results for input(s): TSH, T4TOTAL, T3FREE, THYROIDAB in the last 72 hours.  Invalid input(s): FREET3 Anemia Panel: No results for input(s): VITAMINB12, FOLATE, FERRITIN, TIBC, IRON, RETICCTPCT in the last 72 hours.   Radiology: CT HEAD WO CONTRAST ( ) Result Date: 05/17/2024 CLINICAL DATA:  Neuro deficit, acute, stroke suspected EXAM: CT HEAD WITHOUT CONTRAST TECHNIQUE: Contiguous axial images were obtained from the base  of the skull through the vertex without intravenous contrast. RADIATION DOSE REDUCTION: This exam was performed according to the departmental dose-optimization  program which includes automated exposure control, adjustment of the mA and/or kV according to patient size and/or use of iterative reconstruction technique. COMPARISON:  CT of the head dated December 10, 2021. FINDINGS: Brain: The patient's head is poorly position within the computer gantry. There is age related cerebral volume loss and there is diffuse cerebral white matter disease. There are chronic encephalomalacia changes within the left occipital lobe. There is no evidence of hemorrhage, mass or acute cortical infarct. Vascular: Negative. Skull: Intact and unremarkable. Sinuses/Orbits: Complete opacification of the right maxillary sinus with dense secretions. Mild polypoid mucosal disease within the floor of the left maxillary sinus. Normal orbits. Other: None. IMPRESSION: 1. Technically limited study demonstrating generalized cerebral volume loss and moderate diffuse cerebral white matter disease. No apparent acute abnormality. 2. 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 3. Bilateral maxillary sinus disease. Electronically Signed   By: Evalene Coho M.D.   On: 05/17/2024 13:33   ECHOCARDIOGRAM COMPLETE Result Date: 05/15/2024    ECHOCARDIOGRAM REPORT   Patient Name:   George Bolton Date of Exam: 05/15/2024 Medical Rec #:  [id number]       Height:       73.0 in Accession #:    [id number]      Weight:       168.0 lb Date of Birth:  1941-05-07       BSA:          1.998 m Patient Age:    83 years        BP:           153/93 mmHg Patient Gender: M               HR:           78 bpm. Exam Location:  [facility] Procedure: 2D Echo, Color Doppler and Cardiac Doppler (Both Spectral and Color            Flow Doppler were utilized during procedure). Indications:     CHF-acute diastolic I50.31  History:         Patient has prior history of  Echocardiogram examinations, most                  recent 09/19/2022. Arrythmias:Atrial Fibrillation; Risk                  Factors:Hypertension.  Sonographer:     Christopher Furnace Referring Phys:  [id number] ISAIAH CROME ELLIS Diagnosing Phys: Lonni Hanson MD IMPRESSIONS  1. Left ventricular ejection fraction, by estimation, is 45 to 50%. The left ventricle has mildly decreased function. The left ventricle demonstrates global hypokinesis. There is moderate left ventricular hypertrophy. Left ventricular diastolic parameters are indeterminate.  2. Right ventricular systolic function is moderately reduced. The right ventricular size is normal. There is mildly elevated pulmonary artery systolic pressure.  3. Left atrial size was severely dilated.  4. Right atrial size was mildly dilated.  5. The mitral valve is normal in structure. Trivial mitral valve regurgitation.  6. Tricuspid valve regurgitation is mild to moderate.  7. The aortic valve is tricuspid. There is mild thickening of the aortic valve. Aortic valve regurgitation is trivial. Aortic valve sclerosis is present, with no evidence of aortic valve stenosis.  8. The inferior vena cava is dilated in size with <50% respiratory variability, suggesting right atrial pressure of 15 mmHg. FINDINGS  Left Ventricle: Left ventricular ejection fraction, by estimation, is 45 to 50%. The left ventricle has mildly decreased function. The  left ventricle demonstrates global hypokinesis. The left ventricular internal cavity size was normal in size. There is  moderate left ventricular hypertrophy. Left ventricular diastolic parameters are indeterminate. Right Ventricle: The right ventricular size is normal. No increase in right ventricular wall thickness. Right ventricular systolic function is moderately reduced. There is mildly elevated pulmonary artery systolic pressure. The tricuspid regurgitant velocity is 2.71 m/s, and with an assumed right atrial pressure of 15 mmHg, the estimated  right ventricular systolic pressure is 44.4 mmHg. Left Atrium: Left atrial size was severely dilated. Right Atrium: Right atrial size was mildly dilated. Pericardium: There is no evidence of pericardial effusion. Mitral Valve: The mitral valve is normal in structure. Trivial mitral valve regurgitation. Tricuspid Valve: The tricuspid valve is normal in structure. Tricuspid valve regurgitation is mild to moderate. Aortic Valve: The aortic valve is tricuspid. There is mild thickening of the aortic valve. Aortic valve regurgitation is trivial. Aortic valve sclerosis is present, with no evidence of aortic valve stenosis. Aortic valve mean gradient measures 1.7 mmHg. Aortic valve peak gradient measures 2.8 mmHg. Aortic valve area, by VTI measures 2.04 cm. Pulmonic Valve: The pulmonic valve was normal in structure. Pulmonic valve regurgitation is trivial. No evidence of pulmonic stenosis. Aorta: The aortic root is normal in size and structure. Pulmonary Artery: The pulmonary artery is of normal size. Venous: The inferior vena cava is dilated in size with less than 50% respiratory variability, suggesting right atrial pressure of 15 mmHg. IAS/Shunts: The interatrial septum was not well visualized.  LEFT VENTRICLE PLAX 2D LVIDd:         4.70 cm LVIDs:         3.40 cm LV PW:         1.30 cm LV IVS:        1.48 cm LVOT diam:     2.00 cm LV SV:         30 LV SV Index:   15 LVOT Area:     3.14 cm  LV Volumes (MOD) LV vol d, MOD A2C: 44.3 ml LV vol d, MOD A4C: 41.6 ml LV vol s, MOD A2C: 26.4 ml LV vol s, MOD A4C: 25.2 ml LV SV MOD A2C:     18.0 ml LV SV MOD A4C:     41.6 ml LV SV MOD BP:      17.2 ml RIGHT VENTRICLE RV Basal diam:  3.90 cm RV Mid diam:    3.00 cm RV S prime:     6.64 cm/s TAPSE (M-mode): 0.9 cm LEFT ATRIUM              Index        RIGHT ATRIUM           Index LA diam:        4.80 cm  2.40 cm/m   RA Area:     19.10 cm LA Vol (A2C):   129.0 ml 64.57 ml/m  RA Volume:   52.10 ml  26.08 ml/m LA Vol (A4C):    106.0 ml 53.05 ml/m LA Biplane Vol: 122.0 ml 61.06 ml/m  AORTIC VALVE AV Area (Vmax):    1.79 cm AV Area (Vmean):   1.78 cm AV Area (VTI):     2.04 cm AV Vmax:           84.23 cm/s AV Vmean:          56.200 cm/s AV VTI:            0.145 m  AV Peak Grad:      2.8 mmHg AV Mean Grad:      1.7 mmHg LVOT Vmax:         47.90 cm/s LVOT Vmean:        31.900 cm/s LVOT VTI:          0.094 m LVOT/AV VTI ratio: 0.65  AORTA Ao Root diam: 3.60 cm MITRAL VALVE               TRICUSPID VALVE MV Area (PHT): 5.93 cm    TR Peak grad:   29.4 mmHg MV Decel Time: 128 msec    TR Vmax:        271.00 cm/s MV E velocity: 69.20 cm/s MV A velocity: 26.70 cm/s  SHUNTS MV E/A ratio:  2.59        Systemic VTI:  0.09 m                            Systemic Diam: 2.00 cm Lonni Hanson MD Electronically signed by Lonni Hanson MD Signature Date/Time: 05/15/2024/5:11:08 PM    Final    CT Chest Wo Contrast Result Date: 05/15/2024 EXAM: CT CHEST WITHOUT CONTRAST 05/15/2024 02:40:08 AM TECHNIQUE: CT of the chest was performed without the administration of intravenous contrast. Multiplanar reformatted images are provided for review. Automated exposure control, iterative reconstruction, and/or weight based adjustment of the mA/kV was utilized to reduce the radiation dose to as low as reasonably achievable. COMPARISON: Chest radiograph earlier today. CLINICAL HISTORY: SOB, clear CXR, appears volume overloaded on exam. BIBA due to feeling SOB and difficulty breathing. Pt has hx of CHF, HTN and stage 4 kidney disease. Has edema to left leg. Less than has been pulled off him. External purewick on when he arrived. EMS states fluid on lungs and wet cough. FINDINGS: MEDIASTINUM: Mild cardiomegaly. Moderate 3-vessel coronary atherosclerosis. LYMPH NODES: No mediastinal, hilar or axillary lymphadenopathy. LUNGS AND PLEURA: Mild dependent atelectasis in the bilateral lower lobes. No frank interstitial edema, noting motion degradation. calcified  granuloma in the right middle lobe, benign 3 mm noncalcified nodule in the right upper lobe ( image 24 ), likely benign. No follow up is recommended. Small bilateral pleural effusions. No pneumothorax. SOFT TISSUES/BONES: Degenerative changes of the visualized thoracolumbar spine. UPPER ABDOMEN: Status post cholecystectomy. Bilateral Nonobstructing renal calculi measuring up to 11 mm in the left lower kidney (image 164). Bilateral simple renal cyst, measuring up to 5.8 cm in the left renal sinus (image 148), benign (Bosniak 1), no follow-up is recommended. Probable hemorrhagic cysts measuring up to 1.7 cm in the left lower kidney (image 156), likely benign but technically indeterminate/incompletely visualized. Given the patient's age, dedicated follow-up is considered optional. Atherosclerotic calcifications of the abdominal aorta and branch vessels. IMPRESSION: 1. Mild cardiomegaly.  No frank interstitial edema, noting motion degradation. 2. Small bilateral pleural effusions. 3. Mild dependent atelectasis in the bilateral lower lobes. Electronically signed by: Pinkie Pebbles MD 05/15/2024 02:48 AM EDT RP Workstation: [id number]   DG Chest Portable 1 View Result Date: 05/15/2024 EXAM: 1 VIEW XRAY OF THE CHEST 05/15/2024 01:59:33 AM COMPARISON: 06/12/2023 CLINICAL HISTORY: SOB. PER ER NOTE; BIBA due to feeling SOB and difficulty breathing. Pt has hx of CHF, HTN and stage 4 kidney disease. Has edema to left leg. Less than has been pulled off him. External purewick on when he arrived. EMS states fluid on lungs and wet cough. FINDINGS: LUNGS AND PLEURA: No frank  interstitial edema. No pleural effusion or pneumothorax. HEART AND MEDIASTINUM: Cardiomegaly. Calcified aorta. BONES AND SOFT TISSUES: No acute osseous abnormality. Mild eventration of the right hemidiaphragm. IMPRESSION: 1. No acute findings. 2. Cardiomegaly. No frank interstitial edema. Electronically signed by: Pinkie Pebbles MD 05/15/2024 02:03  AM EDT RP Workstation: [id number]   US  Venous Img Lower Bilateral Result Date: 05/15/2024 EXAM: ULTRASOUND DUPLEX OF THE BILATERAL LOWER EXTREMITY VEINS TECHNIQUE: Duplex ultrasound using B-mode/gray scaled imaging and Doppler spectral analysis and color flow was obtained of the deep venous structures of the bilateral lower extremity. COMPARISON: None. CLINICAL HISTORY: Leg swelling, worse on left side. FINDINGS: The visualized veins of the lower extremity are patent and free of echogenic thrombus. The veins demonstrate good compressibility with normal color flow study and spectral analysis. IMPRESSION: 1. No evidence of DVT in the bilateral lower extremities. Electronically signed by: Pinkie Pebbles MD 05/15/2024 01:05 AM EDT RP Workstation: [id number]    ECHO as above  TELEMETRY reviewed by me 05/18/2024: atrial fibrillation, rate 80-90s  EKG reviewed by me:  atrial fibrillation, rate 80 bpm without acute ischemic changes.   Data reviewed by me 05/18/2024: last 24h vitals tele labs imaging I/O ED provider note, admission H&P.  Principal Problem:   Acute CHF (congestive heart failure) (HCC)    ASSESSMENT AND PLAN:  George Bolton is a 83 y.o. male  from [facility], bedbound with a past medical history of chronic HFpEF, coronary artery disease, paroxsymal atrial fibrillation, hx rheumatic fever, hx CVA, hypertension, hyperlipidemia, CKD, BPH, lymphedema, chronic gout  who presented to the ED on 05/15/2024 for worsening shortness of breath, lower extremity edema and decreased UOP. Echo this admission revealed newly reduced EF of 45-50%. Cardiology was consulted for further evaluation.   # Acute on chronic HFpEF (Newly mildly reduced this admission 45-50%) BNP elevated at 850. CXR with cardiomegaly. CT chest with small bilateral pleural effusions. Echo this admission with newly reduced EF of 45-50% with global hypokinesis, mildly decreased function of LV, mod LVH, mild to  mod TR. Patient appears near euvolemic -Transition IV to PO diuresis with PO torsemide  20 mg daily. -Continue home Coreg  6.25 BID -Continue home empagliflozin  10 mg daily.  -D/C hydralazine  and ordered losartan  50 mg daily. Uptitrate as BP allows.  -Consider addition of spirolactone at outpatient follow-up if renal function allows.   # Coronary artery disease # Hypertension # Hyperlipidemia Patient without chest pain. Trops minimally elevated and flat 27 > 30 is most consistent with demand ischemia, not ACS in setting of AoCHFpEF. EKG without acute ischemic changes.  -Continue home Imdur  30 mg daily. -Continue home rosuvastatin  10 mg daily.  -Continue Coreg  as stated above.  -Losartan  as stated above.   # Paroxsymal atrial fibrillation EKG this admission atrial fibrillation, rate 80 bpm. Per tele remains in AF with controlled rates.  -Continue home Eliquis  for stroke risk reduction. -Continue Coreg  as stated above.   This patient's plan of care was discussed and created with Dr. Ammon and he is in agreement.  Signed: Dorene Comfort, PA-C  05/18/2024, 11:01 AM [facility] Cardiology

## 2024-05-18 NOTE — TOC Progression Note (Signed)
 Transition of Care [facility]) - Progression Note    Patient Details  Name: George Bolton MRN: [id number] Date of Birth: 04-28-1941  [facility] CM/SW Contact  Kandance Yano C Lysbeth Dicola, RN Phone Number: 05/18/2024, 2:19 PM  Clinical Narrative:     [facility] from [facility] updated patient not medically stable for discharge per MD. Pending cards clearance.                     Expected Discharge Plan and Services                                               Social Drivers of Health (SDOH) Interventions SDOH Screenings   Food Insecurity: No Food Insecurity (05/15/2024)  Housing: Low Risk  (05/15/2024)  Transportation Needs: No Transportation Needs (05/15/2024)  Utilities: Not At Risk (05/15/2024)  Social Connections: Moderately Isolated (05/15/2024)  Tobacco Use: Medium Risk (05/15/2024)    Readmission Risk Interventions    09/20/2022   10:22 AM  Readmission Risk Prevention Plan  Post Dischage Appt Complete  Medication Screening Complete  Transportation Screening Complete

## 2024-05-18 NOTE — Evaluation (Signed)
 Clinical/Bedside Swallow Evaluation Patient Details  Name: George Bolton MRN: [id number] Date of Birth: 1941/08/21  Today's Date: 05/18/2024 Time: SLP Start Time (ACUTE ONLY): 0915 SLP Stop Time (ACUTE ONLY): 0935 SLP Time Calculation (min) (ACUTE ONLY): 20 min  Past Medical History:  Past Medical History:  Diagnosis Date   Acute embolic stroke (HCC) 11/26/2021   Chronic atrial fibrillation (HCC)    a. Afib dates back to at least 2002 when reviewing prior EKG with EKGs from 2014 onward showing Afib; b. CHADS2VASc at least 4 (CHF, HTN, age x 2)   Chronic diastolic CHF (congestive heart failure) (HCC)    a. TTE 5/19: EF 70-75%, mod LVH, near complete obliteration of the mid and apical LV cavity (can be seen in apical hypertrophic CM), mildly dilated LA, RV cavity size nl, RV wall thickness nl, RVSF mildly reduced, mildly dilated RA; b. 02/2018 Limited echo w/ definity : EF 50-65%, no apical hypertrophic CM.   Community acquired bilateral lower lobe pneumonia 11/23/2021   Goals of care, counseling/discussion 12/11/2021   Gout    Hypertension    Hypertensive emergency 12/10/2021   Hypertensive urgency 07/16/2022   Past Surgical History:  Past Surgical History:  Procedure Laterality Date   CHOLECYSTECTOMY N/A 01/13/2017   Procedure: LAPAROSCOPIC CHOLECYSTECTOMY;  Surgeon: Laneta JULIANNA Luna, MD;  Location: [facility] ORS;  Service: General;  Laterality: N/A;   HPI:  George Bolton is a 83 y.o. male with medical history significant of HFpEF, atrial fibrillation on Eliquis , gout, hypertension, chronic lower extremity lymphedema, CKD 4, diabetes mellitus 2, NSVT, prior history of stroke with mild right upper extremity deficits involving the hand, dyslipidemia, CAD and BPH.  Presented to the ED via EMS reporting shortness of breath and difficulty breathing.  Noted with lower extremity bilateral edema.  Paramedics report that pulmonary exam prior to arrival consistent with volume overload with diffuse  crackles.  Resting room air sats were 96%.  Lower extremity venous duplex negative for DVT.  Chest x-ray demonstrated cardiomegaly but no frank interstitial edema.  CT of the chest revealed no edema but there were small bilateral pleural effusions and mild dependent atelectasis in the bilateral lower lobes.  BNP was elevated at 856, troponin mildly elevated at 27 and 30 with flat trend.  D-dimer slightly elevated at 0.56.  PCR for COVID, flu and RSV negative.  Room air sats have been variable between 93 and 100%.  Diastolic blood pressures have been elevated in the 90 range.  Hospitalist service has been asked to evaluate the patient for admission. Head CT 05/17/24: Technically limited study demonstrating generalized cerebral  volume loss and moderate diffuse cerebral white matter disease. No  apparent acute abnormality.  2. 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  3. Bilateral maxillary sinus disease.    Assessment / Plan / Recommendation  Clinical Impression  Pt seen for bedside swallow assessment in the setting of family report of difficulty swallowing. Son present in the room for assessment and expounded on concern, reporting pt with difficulty pulling from straw yesterday (though that has since resolved and pt able to use straw this AM), raising concern for CVA. Head CT negative for acute injury. Pt lethargic following the completion of approximately 50% of breakfast meal- SLP saw the last bite of regular solids. No s/sx of aspiration, though oral phase notably prolonged for mastication and transfer, with pt benefiting from additional time for oral clearance.   Given pt lethargy, emphasis of session shifted to education with son, regarding  modifying diet to aid efficiency and endurance for PO intake, aspiration risk, aspiration precautions, and impact of aging on swallow function. Pt's son reported understanding across education.  Based on current deconditioning, hx of CVA, reduced  dentition, and oral phase deficits noted above, pt is at increased risk for aspiration. Recommend slight modification of solids to chopped meat with extra sauce (to aid endurance and manipulation), continue with thin liquids. Supervision/assist for meals, monitor for endurance/allow for rest. General aspiration precautions (slow rate, small bites, elevated HOB, and alert for PO intake). Monitor for impulsivity with straws. No further SLP services indicated at this time. MD and RN aware of recommendations.     SLP Visit Diagnosis: Dysphagia, unspecified (R13.10) (some AMS from hospital admission)    Aspiration Risk  Mild aspiration risk    Diet Recommendation   Age appropriate regular;Thin (chopped meats)  Medication Administration: Whole meds with liquid (as is baseline- with caution- in puree if needed)    Other  Recommendations Oral Care Recommendations: Oral care BID;Oral care before and after PO;Staff/trained caregiver to provide oral care     Assistance Recommended at Discharge  Assistance with PO intake and application of general safe swallowing/aspiration precautions.   Functional Status Assessment Patient has not had a recent decline in their functional status (grosspy at baseline)    Swallow Study   General Date of Onset: 05/18/24 HPI: George Bolton is a 83 y.o. male with medical history significant of HFpEF, atrial fibrillation on Eliquis , gout, hypertension, chronic lower extremity lymphedema, CKD 4, diabetes mellitus 2, NSVT, prior history of stroke with mild right upper extremity deficits involving the hand, dyslipidemia, CAD and BPH.  Presented to the ED via EMS reporting shortness of breath and difficulty breathing.  Noted with lower extremity bilateral edema.  Paramedics report that pulmonary exam prior to arrival consistent with volume overload with diffuse crackles.  Resting room air sats were 96%.  Lower extremity venous duplex negative for DVT.  Chest x-ray demonstrated  cardiomegaly but no frank interstitial edema.  CT of the chest revealed no edema but there were small bilateral pleural effusions and mild dependent atelectasis in the bilateral lower lobes.  BNP was elevated at 856, troponin mildly elevated at 27 and 30 with flat trend.  D-dimer slightly elevated at 0.56.  PCR for COVID, flu and RSV negative.  Room air sats have been variable between 93 and 100%.  Diastolic blood pressures have been elevated in the 90 range.  Hospitalist service has been asked to evaluate the patient for admission. Head CT 05/17/24: Technically limited study demonstrating generalized cerebral  volume loss and moderate diffuse cerebral white matter disease. No  apparent acute abnormality.  2. 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  3. Bilateral maxillary sinus disease. Type of Study: Bedside Swallow Evaluation Previous Swallow Assessment: SLP services in 2023, with recommendation for Dys 2 and thin liquids in the setting of oral deficits Diet Prior to this Study: Regular;Thin liquids (Level 0) Temperature Spikes Noted: No Respiratory Status: Room air History of Recent Intubation: No Behavior/Cognition: Lethargic/Drowsy Oral Cavity Assessment: Within Functional Limits Oral Care Completed by SLP: Recent completion by staff (pt completing breakfast upon therapist entrance) Oral Cavity - Dentition: Missing dentition (son reports recent dental surgery for removal of molars) Patient Positioning: Upright in bed Baseline Vocal Quality: Not observed Volitional Cough: Cognitively unable to elicit Volitional Swallow: Unable to elicit    Oral/Motor/Sensory Function Overall Oral Motor/Sensory Function: Within functional limits   Ice Chips Ice  chips: Not tested   Thin Liquid Thin Liquid: Not tested Other Comments: son denied issue- pt lethargic, preventing completion    Nectar Thick Nectar Thick Liquid: Not tested   Honey Thick Honey Thick Liquid: Not tested   Puree Puree:  Not tested   Solid     Solid: Impaired Presentation: Spoon Oral Phase Impairments: Impaired mastication;Poor awareness of bolus Oral Phase Functional Implications: Impaired mastication;Prolonged oral transit Pharyngeal Phase Impairments:  (none)     [place] Jani Moronta Clapp, MS, CCC-SLP Speech Language Pathologist Rehab Services; [facility] [place] [phone] (ascom)   [place] J Clapp 05/18/2024,9:47 AM

## 2024-05-18 NOTE — Progress Notes (Signed)
 Occupational Therapy Treatment Patient Details Name: George Bolton MRN: [id number] DOB: January 10, 1941 Today's Date: 05/18/2024   History of present illness George Bolton is a 83 y.o. male with medical history significant of HFpEF, atrial fibrillation on Eliquis , gout, hypertension, chronic lower extremity lymphedema, CKD 4, diabetes mellitus 2, prior history of stroke with mild right upper extremity deficits involving the hand. Presented to the ED via EMS reporting shortness of breath and difficulty breathing.   OT comments  Pt seen for OT treatment on this date. Upon arrival to room pt asleep, easily awakes to voice. Pt required MAXA to come sit on the EOB, hand-over-hand assistance for hand placement on the bed rails, pt putting forth good effort to move to the EOB. Pt unable to maintain sitting position due to poor core strength and limited UE coordination. Pt required MAXA, HOB elevated to remain in an upright position. Noted pt's neck grossly flexed with the inability to extend neck. Pt requires MINA for the completion of bed level grooming tasks including oral care, face/hand washing. Pt making fair progress toward goals, will continue to follow POC. Discharge recommendation remains appropriate.        If plan is discharge home, recommend the following:  Supervision due to cognitive status;Help with stairs or ramp for entrance;A lot of help with bathing/dressing/bathroom;A lot of help with walking and/or transfers   Equipment Recommendations  Hospital bed    Recommendations for Other Services      Precautions / Restrictions Precautions Precautions: Fall Recall of Precautions/Restrictions: Impaired Restrictions Weight Bearing Restrictions Per Provider Order: No       Mobility Bed Mobility Overal bed mobility: Needs Assistance Bed Mobility: Supine to Sit, Sit to Supine     Supine to sit: Max assist, HOB elevated, Used rails Sit to supine: Max assist, Used rails, HOB elevated    General bed mobility comments: MAXA +2 boosting pt up post sitting on the EOB.    Transfers                   General transfer comment: Pt unable to tolerate sitting on the EOB without a heavly flexed posture and MAXA for sitting up, unsafe to attempt OOB mobility with 1 external support.     Balance Overall balance assessment: Needs assistance Sitting-balance support: Bilateral upper extremity supported, Feet supported Sitting balance-Leahy Scale: Zero Sitting balance - Comments: MAXA to sit on the EOB, posterior/anterior lean                                   ADL either performed or assessed with clinical judgement   ADL Overall ADL's : Needs assistance/impaired     Grooming: Wash/dry face;Wash/dry hands;Oral care; MINA, Bed level                                 General ADL Comments: Anticipate MAXA+2 for mobility, MINA for completion of task for facewashing and oral care.    Communication Communication Communication: Impaired Factors Affecting Communication: Reduced clarity of speech   Cognition Arousal: Alert Behavior During Therapy: WFL for tasks assessed/performed, Flat affect Cognition: No family/caregiver present to determine baseline             OT - Cognition Comments: A/Ox3 ?situation                 Following  commands: Impaired Following commands impaired: Follows one step commands inconsistently, Follows one step commands with increased time      Cueing   Cueing Techniques: Verbal cues, Gestural cues, Tactile cues  Exercises Exercises: Other exercises Other Exercises Other Exercises: Edu: AAROM exercises in bilateral UEs at the bed level, neck stretches to encourage neck extension           General Comments Pt eager to get food out of his teeth on this date    Pertinent Vitals/ Pain       Pain Assessment Pain Assessment: No/denies pain                                                           Frequency  Min 2X/week        Progress Toward Goals  OT Goals(current goals can now be found in the care plan section)  Progress towards OT goals: Progressing toward goals  Acute Rehab OT Goals OT Goal Formulation: With patient Time For Goal Achievement: 05/31/24 Potential to Achieve Goals: Fair ADL Goals Pt Will Perform Grooming: with min assist;sitting Pt Will Perform Lower Body Dressing: sit to/from stand;with max assist Pt Will Transfer to Toilet: stand pivot transfer;with min assist  Plan      Co-evaluation                 AM-PAC OT 6 Clicks Daily Activity     Outcome Measure   Help from another person eating meals?: A Lot Help from another person taking care of personal grooming?: A Lot Help from another person toileting, which includes using toliet, bedpan, or urinal?: A Lot Help from another person bathing (including washing, rinsing, drying)?: A Lot Help from another person to put on and taking off regular upper body clothing?: A Little Help from another person to put on and taking off regular lower body clothing?: A Lot 6 Click Score: 13        OT Visit Diagnosis: Other abnormalities of gait and mobility (R26.89);Muscle weakness (generalized) (M62.81)   Activity Tolerance Patient tolerated treatment well   Patient Left in bed;with call bell/phone within reach;with bed alarm set   Nurse Communication          Time: 8493-8477 OT Time Calculation (min): 16 min  Charges: OT General Charges $OT Visit: 1 Visit OT Treatments $Self Care/Home Management : 8-22 mins  Larraine Colas M.S. OTR/L  05/18/24, 3:44 PM

## 2024-05-18 NOTE — Progress Notes (Signed)
 Progress Note   Patient: George Bolton FMW:[id number] DOB: 08/06/1941 DOA: 05/15/2024     3 DOS: the patient was seen and examined on 05/18/2024   Brief hospital course: From HPI George Bolton is a 83 y.o. male with medical history significant of HFpEF, atrial fibrillation on Eliquis , gout, hypertension, chronic lower extremity lymphedema, CKD 4, diabetes mellitus 2, NSVT, prior history of stroke with mild right upper extremity deficits involving the hand, dyslipidemia, CAD and BPH.  Presented to the ED via EMS reporting shortness of breath and difficulty breathing.  Noted with lower extremity bilateral edema.  Paramedics report that pulmonary exam prior to arrival consistent with volume overload with diffuse crackles.  Resting room air sats were 96%.  Lower extremity venous duplex negative for DVT.  Chest x-ray demonstrated cardiomegaly but no frank interstitial edema.  CT of the chest revealed no edema but there were small bilateral pleural effusions and mild dependent atelectasis in the bilateral lower lobes.  BNP was elevated at 856, troponin mildly elevated at 27 and 30 with flat trend.  D-dimer slightly elevated at 0.56.  PCR for COVID, flu and RSV negative.  Room air sats have been variable between 93 and 100%.  Diastolic blood pressures have been elevated in the 90 range.  Patient was admitted for acute on chronic CHF.  7/23: Hemodynamically stable, improving renal function with creatinine at 1.98 today's-continuing IV diuresis for another day.  Son concern of difficulty swallowing and a new stroke-CT head was negative for any acute abnormality, did show moderate diffuse chronic encephalomalacia. Echocardiogram with new diagnosis of HFrEF, EF of 45 to 50%-consulting cardiology.  7/24: Remained hemodynamically stable and at baseline.  Slight increase in creatinine to 2.10, IV Lasix  is being switched with p.o. torsemide  from tomorrow.  Clinically appears dry, cardiology would like to keep him  for another day.  Assessment and Plan:  Exacerbation of HFrEF No resting hypoxemia Continue preadmission Jardiance , Toprol -XL, Imdur  and Apresoline  Last echocardiogram in 2023 demonstrated an EF of 60 to 65% with diastolic parameters consistent with grade 1 diastolic dysfunction, mild MR Repeat echocardiogram with decreased EF of 45 to 50%, indeterminate diastolic dysfunction, moderately reduced RV systolic function and mildly elevated pulmonary arterial pressure, severely dilated left atrium and dilated IVC. IV Lasix  is being switched with p.o. torsemide  Continue to monitor Daily weights with strict I/O Daily BMP   Elevated troponin EKG nonischemic in etiology and troponin trend flat This is consistent with demand ischemia from heart failure exacerbation   Chronic atrial fibrillation Continue Eliquis  and Toprol -XL   Thrombocytopenia Seems stable with some small variations, platelets of 113 today.  Continue to follow platelets-likely secondary to Eliquis  use No signs of bleeding detected   Hypertension Blood pressure mildly elevated. - Continue current medications   CKD 4 Current parameters at baseline Does have some mild metabolic acidosis and could benefit from coadministration of regular oral sodium bicarbonate Follow-up with nephrology in the outpatient setting   Diabetes mellitus 2 Follow CBGs and provide SSI On coadministration of Jardiance  for heart failure Hemoglobin A1c August 2024 was 6.9 Repeat A1c on 7/21 was 6.2   History of CVA Continue statin Repeat CT head today was negative for any acute abnormality  BPH Monitor for urinary retention especially with administration of Lasix  Continue Proscar    History of gout Continue Zyloprim    HLD/CAD Continue statin and beta-blocker Continue Eliquis      Advance Care Planning:   Code Status: Limited: Do not attempt resuscitation (DNR) -DNR-LIMITED -  Do Not Intubate/DNI     VTE prophylaxis: Eliquis     Consults: Cardiology   Family Communication: Talked with son on phone.   Subjective:  Patient was resting comfortably when seen today.  Ate his breakfast, denies any chest pain.  Physical Exam: General.  Frail elderly man, in no acute distress. Pulmonary.  Lungs clear bilaterally, normal respiratory effort. CV.  Regular rate and rhythm, no JVD, rub or murmur. Abdomen.  Soft, nontender, nondistended, BS positive. CNS.  Alert and oriented .  No focal neurologic deficit. Extremities.  No edema, no cyanosis, pulses intact and symmetrical.        Vitals:   05/18/24 0018 05/18/24 0400 05/18/24 0720 05/18/24 1106  BP: (!) 166/91 (!) 173/88 (!) 170/93 92/62  Pulse: 81 87 84 81  Resp: (!) 22 20 16 20   Temp: 98.2 F (36.8 C) 98.3 F (36.8 C) 97.6 F (36.4 C) 98.2 F (36.8 C)  TempSrc: Axillary Axillary Oral Oral  SpO2: 95% 96% 99% 96%  Weight:  72.6 kg    Height:        Data Reviewed:  Chest x-ray reviewed by me showing cardiomegaly    Latest Ref Rng & Units 05/18/2024    3:07 AM 05/17/2024    4:38 AM 05/15/2024   12:30 AM  CBC  WBC 4.0 - 10.5 K/uL 6.8  6.7  6.3   Hemoglobin 13.0 - 17.0 g/dL 86.9  87.4  88.1   Hematocrit 39.0 - 52.0 % 40.4  38.9  38.1   Platelets 150 - 400 K/uL 113  138  108        Latest Ref Rng & Units 05/18/2024    3:07 AM 05/17/2024    4:38 AM 05/16/2024    4:42 AM  BMP  Glucose 70 - 99 mg/dL 890  897  899   BUN 8 - 23 mg/dL 41  38  37   Creatinine 0.61 - 1.24 mg/dL 7.89  8.01  7.73   Sodium 135 - 145 mmol/L 143  140  142   Potassium 3.5 - 5.1 mmol/L 4.6  4.1  3.9   Chloride 98 - 111 mmol/L 109  110  108   CO2 22 - 32 mmol/L 22  19  22    Calcium  8.9 - 10.3 mg/dL 9.6  9.4  9.5     This record has been created using Conservation officer, historic buildings. Errors have been sought and corrected,but may not always be located. Such creation errors do not reflect on the standard of care.   Author: Amaryllis Dare, MD 05/18/2024 1:54 PM  For on call review  [email or web address].

## 2024-05-19 DIAGNOSIS — I5041 Acute combined systolic (congestive) and diastolic (congestive) heart failure: Secondary | ICD-10-CM | POA: Diagnosis not present

## 2024-05-19 DIAGNOSIS — R0602 Shortness of breath: Secondary | ICD-10-CM | POA: Diagnosis not present

## 2024-05-19 LAB — BASIC METABOLIC PANEL WITH GFR
Anion gap: 11 (ref 5–15)
BUN: 44 mg/dL — ABNORMAL HIGH (ref 8–23)
CO2: 23 mmol/L (ref 22–32)
Calcium: 9.3 mg/dL (ref 8.9–10.3)
Chloride: 110 mmol/L (ref 98–111)
Creatinine, Ser: 2.24 mg/dL — ABNORMAL HIGH (ref 0.61–1.24)
GFR, Estimated: 28 mL/min — ABNORMAL LOW (ref >60)
Glucose, Bld: 110 mg/dL — ABNORMAL HIGH (ref 70–99)
Potassium: 4 mmol/L (ref 3.5–5.1)
Sodium: 144 mmol/L (ref 135–145)

## 2024-05-19 LAB — GLUCOSE, CAPILLARY: Glucose-Capillary: 116 mg/dL — ABNORMAL HIGH (ref 70–99)

## 2024-05-19 MED ORDER — TORSEMIDE 20 MG PO TABS: 20.0000 mg | ORAL_TABLET | Freq: Every day | ORAL | 1 refills | Status: AC

## 2024-05-19 MED ORDER — TAMSULOSIN HCL 0.4 MG PO CAPS: 0.4000 mg | ORAL_CAPSULE | Freq: Every day | ORAL | Status: AC

## 2024-05-19 MED ORDER — LOSARTAN POTASSIUM 50 MG PO TABS: 50.0000 mg | ORAL_TABLET | Freq: Every day | ORAL | 1 refills | Status: AC

## 2024-05-19 NOTE — Plan of Care (Signed)

## 2024-05-19 NOTE — Progress Notes (Signed)
 [facility] CLINIC CARDIOLOGY PROGRESS NOTE       Patient ID: George Bolton MRN: [id number] DOB/AGE: Dec 26, 1940 83 y.o.  Admit date: 05/15/2024 Referring Physician Dr. Caleen Primary Physician Cecillia Landry RAMAN, MD Primary Cardiologist Dr. Florencio Reason for Consultation Newly reduced EF  HPI: George Bolton is a 83 y.o. male  from [facility], bedbound with a past medical history of chronic HFpEF, coronary artery disease, paroxsymal atrial fibrillation, hx rheumatic fever, hx CVA, hypertension, hyperlipidemia, CKD, BPH, lymphedema, chronic gout  who presented to the ED on 05/15/2024 for worsening shortness of breath, lower extremity edema and decreased UOP. Echo this admission revealed newly reduced EF of 45-50%. Cardiology was consulted for further evaluation.   Interval History: -Patient seen and examined this AM and laying comfortably in hospital bed. Patient states she feels good and denies rest pain, SOB..  -Patients BP and HR stable this AM. Overnight Tele showed no significant events.  -Yesterday UOP 1.55L, with slight increase in creatinine. -Patient remains on room air with stable SpO2.   Review of systems complete and found to be negative unless listed above    Past Medical History:  Diagnosis Date   Acute embolic stroke (HCC) 11/26/2021   Chronic atrial fibrillation (HCC)    a. Afib dates back to at least 2002 when reviewing prior EKG with EKGs from 2014 onward showing Afib; b. CHADS2VASc at least 4 (CHF, HTN, age x 2)   Chronic diastolic CHF (congestive heart failure) (HCC)    a. TTE 5/19: EF 70-75%, mod LVH, near complete obliteration of the mid and apical LV cavity (can be seen in apical hypertrophic CM), mildly dilated LA, RV cavity size nl, RV wall thickness nl, RVSF mildly reduced, mildly dilated RA; b. 02/2018 Limited echo w/ definity : EF 50-65%, no apical hypertrophic CM.   Community acquired bilateral lower lobe pneumonia 11/23/2021   Goals of  care, counseling/discussion 12/11/2021   Gout    Hypertension    Hypertensive emergency 12/10/2021   Hypertensive urgency 07/16/2022    Past Surgical History:  Procedure Laterality Date   CHOLECYSTECTOMY N/A 01/13/2017   Procedure: LAPAROSCOPIC CHOLECYSTECTOMY;  Surgeon: Laneta JULIANNA Luna, MD;  Location: [facility] ORS;  Service: General;  Laterality: N/A;    Medications Prior to Admission  Medication Sig Dispense Refill Last Dose/Taking   acetaminophen  (TYLENOL ) 650 MG CR tablet Take 1,300 mg by mouth 2 (two) times daily.   05/14/2024   allopurinol  (ZYLOPRIM ) 100 MG tablet Take 1 tablet (100 mg total) by mouth daily. Home med.   05/14/2024   apixaban  (ELIQUIS ) 2.5 MG TABS tablet Take 2.5 mg by mouth 2 (two) times daily.   05/14/2024   bisacodyl  (DULCOLAX) 5 MG EC tablet Take 2 tablets (10 mg total) by mouth at bedtime. Skip the dose if no constipation   Unknown   carvedilol  (COREG ) 12.5 MG tablet Take 12.5 mg by mouth 2 (two) times daily. Take along with one 6.25 mg tablet for total 18.75 mg twice daily   05/14/2024   carvedilol  (COREG ) 6.25 MG tablet Take 6.25 mg by mouth 2 (two) times daily. Take along with one 12.5 mg tablet for total 18.75 mg twice daily   05/14/2024   cloNIDine  (CATAPRES  - DOSED IN MG/24 HR) 0.3 mg/24hr patch Place 0.3 mg onto the skin once a week.   05/08/2024   empagliflozin  (JARDIANCE ) 10 MG TABS tablet Take 10 mg by mouth daily.   05/14/2024   finasteride  (PROSCAR ) 5 MG tablet  Take 1 tablet (5 mg total) by mouth daily. 30 tablet 11 05/14/2024   furosemide  (LASIX ) 40 MG tablet Take 40 mg by mouth daily.   05/14/2024   hydrALAZINE  (APRESOLINE ) 50 MG tablet Take 50 mg by mouth 3 (three) times daily.   05/14/2024   isosorbide  mononitrate (IMDUR ) 30 MG 24 hr tablet Take 1 tablet (30 mg total) by mouth daily. Hold if SBP <130 mmHg   05/14/2024   nystatin  (MYCOSTATIN /NYSTOP ) powder Apply 1 Application topically 2 (two) times daily.   Unknown   polyethylene glycol (MIRALAX  / GLYCOLAX ) 17 g  packet Take 17 g by mouth 2 (two) times daily. Skip the dose if no constipation   Unknown   rosuvastatin  (CRESTOR ) 20 MG tablet Take 1 tablet (20 mg total) by mouth daily. 30 tablet 0 05/14/2024   [DISCONTINUED] tamsulosin  (FLOMAX ) 0.4 MG CAPS capsule Take 1 capsule (0.4 mg total) by mouth daily after supper.   05/14/2024   fexofenadine (ALLEGRA) 180 MG tablet Take 180 mg by mouth daily. (Patient not taking: Reported on 05/17/2024)   Not Taking   metoprolol  succinate (TOPROL -XL) 100 MG 24 hr tablet Take 1 tablet (100 mg total) by mouth every evening. Take with or immediately following a meal.  Home med. (Patient not taking: Reported on 05/17/2024)   Not Taking   mirtazapine  (REMERON ) 7.5 MG tablet Take 7.5 mg by mouth at bedtime. (Patient not taking: Reported on 05/17/2024)   Not Taking   traMADol  (ULTRAM ) 50 MG tablet Take 50 mg by mouth 2 (two) times daily. (Patient not taking: Reported on 05/17/2024)   Not Taking   Social History   Socioeconomic History   Marital status: Married    Spouse name: Not on file   Number of children: Not on file   Years of education: Not on file   Highest education level: Not on file  Occupational History   Not on file  Tobacco Use   Smoking status: Former    Types: Cigarettes   Smokeless tobacco: Never  Vaping Use   Vaping status: Never Used  Substance and Sexual Activity   Alcohol use: No   Drug use: No   Sexual activity: Never  Other Topics Concern   Not on file  Social History Narrative   Not on file   Social Drivers of Health   Financial Resource Strain: Not on file  Food Insecurity: No Food Insecurity (05/15/2024)   Hunger Vital Sign    Worried About Running Out of Food in the Last Year: Never true    Ran Out of Food in the Last Year: Never true  Transportation Needs: No Transportation Needs (05/15/2024)   PRAPARE - Administrator, Civil Service (Medical): No    Lack of Transportation (Non-Medical): No  Physical Activity: Not on file   Stress: Not on file  Social Connections: Moderately Isolated (05/15/2024)   Social Connection and Isolation Panel    Frequency of Communication with Friends and Family: More than three times a week    Frequency of Social Gatherings with Friends and Family: More than three times a week    Attends Religious Services: Never    Database administrator or Organizations: No    Attends Banker Meetings: Never    Marital Status: Married  Catering manager Violence: Not At Risk (05/15/2024)   Humiliation, Afraid, Rape, and Kick questionnaire    Fear of Current or Ex-Partner: No    Emotionally Abused: No  Physically Abused: No    Sexually Abused: No    Family History  Problem Relation Age of Onset   Stroke Mother      Vitals:   05/18/24 2336 05/19/24 0346 05/19/24 0522 05/19/24 0807  BP: (!) 176/119 (!) 162/119  (!) 165/116  Pulse: 84 79  89  Resp: 19 20  (!) 22  Temp: 99.3 F (37.4 C) 97.8 F (36.6 C)  (!) 97.4 F (36.3 C)  TempSrc:      SpO2: 97% 92%  98%  Weight:   68.7 kg   Height:        PHYSICAL EXAM General: Chronically ill appearing elderly male, well nourished, in no acute distress. HEENT: Normocephalic and atraumatic. Neck: No JVD.   Lungs: Normal respiratory effort on room air. Clear, diminished bilaterally.  Heart: irregularly, irregular, controlled rate. Normal S1 and S2 without gallops or murmurs.  Abdomen: Non-distended appearing.  Msk: Normal strength and tone for age. Extremities: Warm and well perfused. No clubbing, cyanosis, trace pedal edema.  Neuro: Alert and oriented X 3. Psych: Answers questions appropriately.   Labs: Basic Metabolic Panel: Recent Labs    05/18/24 0307 05/19/24 0447  NA 143 144  K 4.6 4.0  CL 109 110  CO2 22 23  GLUCOSE 109* 110*  BUN 41* 44*  CREATININE 2.10* 2.24*  CALCIUM  9.6 9.3   Liver Function Tests: No results for input(s): AST, ALT, ALKPHOS, BILITOT, PROT, ALBUMIN in the last 72 hours. No  results for input(s): LIPASE, AMYLASE in the last 72 hours. CBC: Recent Labs    05/17/24 0438 05/18/24 0307  WBC 6.7 6.8  NEUTROABS 5.1  --   HGB 12.5* 13.0  HCT 38.9* 40.4  MCV 86.6 88.8  PLT 138* 113*   Cardiac Enzymes: No results for input(s): CKTOTAL, CKMB, CKMBINDEX, TROPONINIHS in the last 72 hours. BNP: No results for input(s): BNP in the last 72 hours. D-Dimer: No results for input(s): DDIMER in the last 72 hours. Hemoglobin A1C: No results for input(s): HGBA1C in the last 72 hours.  Fasting Lipid Panel: No results for input(s): CHOL, HDL, LDLCALC, TRIG, CHOLHDL, LDLDIRECT in the last 72 hours. Thyroid Function Tests: No results for input(s): TSH, T4TOTAL, T3FREE, THYROIDAB in the last 72 hours.  Invalid input(s): FREET3 Anemia Panel: No results for input(s): VITAMINB12, FOLATE, FERRITIN, TIBC, IRON, RETICCTPCT in the last 72 hours.   Radiology: CT HEAD WO CONTRAST ( ) Result Date: 05/17/2024 CLINICAL DATA:  Neuro deficit, acute, stroke suspected EXAM: CT HEAD WITHOUT CONTRAST TECHNIQUE: Contiguous axial images were obtained from the base of the skull through the vertex without intravenous contrast. RADIATION DOSE REDUCTION: This exam was performed according to the departmental dose-optimization program which includes automated exposure control, adjustment of the mA and/or kV according to patient size and/or use of iterative reconstruction technique. COMPARISON:  CT of the head dated December 10, 2021. FINDINGS: Brain: The patient's head is poorly position within the computer gantry. There is age related cerebral volume loss and there is diffuse cerebral white matter disease. There are chronic encephalomalacia changes within the left occipital lobe. There is no evidence of hemorrhage, mass or acute cortical infarct. Vascular: Negative. Skull: Intact and unremarkable. Sinuses/Orbits: Complete opacification of the right  maxillary sinus with dense secretions. Mild polypoid mucosal disease within the floor of the left maxillary sinus. Normal orbits. Other: None. IMPRESSION: 1. Technically limited study demonstrating generalized cerebral volume loss and moderate diffuse cerebral white matter disease. No apparent acute abnormality. 2. 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 3. Bilateral maxillary sinus disease. Electronically Signed   By: Evalene Coho M.D.   On: 05/17/2024 13:33   ECHOCARDIOGRAM COMPLETE Result Date: 05/15/2024    ECHOCARDIOGRAM REPORT   Patient Name:   George Bolton Date of Exam: 05/15/2024 Medical Rec #:  [id number]       Height:       73.0 in Accession #:    [id number]      Weight:       168.0 lb Date of Birth:  26-Aug-1941       BSA:          1.998 m Patient Age:    83 years        BP:           153/93 mmHg Patient Gender: M               HR:           78 bpm. Exam Location:  [facility] Procedure: 2D Echo, Color Doppler and Cardiac Doppler (Both Spectral and Color            Flow Doppler were utilized during procedure). Indications:     CHF-acute diastolic I50.31  History:         Patient has prior history of Echocardiogram examinations, most                  recent 09/19/2022. Arrythmias:Atrial Fibrillation; Risk                  Factors:Hypertension.  Sonographer:     Christopher Furnace Referring Phys:  [id number] ISAIAH CROME ELLIS Diagnosing Phys: Lonni Hanson MD IMPRESSIONS  1. Left ventricular ejection fraction, by estimation, is 45 to 50%. The left ventricle has mildly decreased function. The left ventricle demonstrates global hypokinesis. There is moderate left ventricular hypertrophy. Left ventricular diastolic parameters are indeterminate.  2. Right ventricular systolic function is moderately reduced. The right ventricular size is normal. There is mildly elevated pulmonary artery systolic pressure.  3. Left atrial size was severely dilated.  4. Right atrial size was mildly dilated.  5. The mitral  valve is normal in structure. Trivial mitral valve regurgitation.  6. Tricuspid valve regurgitation is mild to moderate.  7. The aortic valve is tricuspid. There is mild thickening of the aortic valve. Aortic valve regurgitation is trivial. Aortic valve sclerosis is present, with no evidence of aortic valve stenosis.  8. The inferior vena cava is dilated in size with <50% respiratory variability, suggesting right atrial pressure of 15 mmHg. FINDINGS  Left Ventricle: Left ventricular ejection fraction, by estimation, is 45 to 50%. The left ventricle has mildly decreased function. The left ventricle demonstrates global hypokinesis. The left ventricular internal cavity size was normal in size. There is  moderate left ventricular hypertrophy. Left ventricular diastolic parameters are indeterminate. Right Ventricle: The right ventricular size is normal. No increase in right ventricular wall thickness. Right ventricular systolic function is moderately reduced. There is mildly elevated pulmonary artery systolic pressure. The tricuspid regurgitant velocity is 2.71 m/s, and with an assumed right atrial pressure of 15 mmHg, the estimated right ventricular systolic pressure is 44.4 mmHg. Left Atrium: Left atrial size was severely dilated. Right Atrium: Right atrial size was mildly dilated. Pericardium: There is no evidence of pericardial effusion. Mitral Valve: The mitral valve is normal in structure. Trivial mitral valve regurgitation. Tricuspid Valve: The tricuspid valve is normal in structure. Tricuspid valve regurgitation is mild to  moderate. Aortic Valve: The aortic valve is tricuspid. There is mild thickening of the aortic valve. Aortic valve regurgitation is trivial. Aortic valve sclerosis is present, with no evidence of aortic valve stenosis. Aortic valve mean gradient measures 1.7 mmHg. Aortic valve peak gradient measures 2.8 mmHg. Aortic valve area, by VTI measures 2.04 cm. Pulmonic Valve: The pulmonic valve was  normal in structure. Pulmonic valve regurgitation is trivial. No evidence of pulmonic stenosis. Aorta: The aortic root is normal in size and structure. Pulmonary Artery: The pulmonary artery is of normal size. Venous: The inferior vena cava is dilated in size with less than 50% respiratory variability, suggesting right atrial pressure of 15 mmHg. IAS/Shunts: The interatrial septum was not well visualized.  LEFT VENTRICLE PLAX 2D LVIDd:         4.70 cm LVIDs:         3.40 cm LV PW:         1.30 cm LV IVS:        1.48 cm LVOT diam:     2.00 cm LV SV:         30 LV SV Index:   15 LVOT Area:     3.14 cm  LV Volumes (MOD) LV vol d, MOD A2C: 44.3 ml LV vol d, MOD A4C: 41.6 ml LV vol s, MOD A2C: 26.4 ml LV vol s, MOD A4C: 25.2 ml LV SV MOD A2C:     18.0 ml LV SV MOD A4C:     41.6 ml LV SV MOD BP:      17.2 ml RIGHT VENTRICLE RV Basal diam:  3.90 cm RV Mid diam:    3.00 cm RV S prime:     6.64 cm/s TAPSE (M-mode): 0.9 cm LEFT ATRIUM              Index        RIGHT ATRIUM           Index LA diam:        4.80 cm  2.40 cm/m   RA Area:     19.10 cm LA Vol (A2C):   129.0 ml 64.57 ml/m  RA Volume:   52.10 ml  26.08 ml/m LA Vol (A4C):   106.0 ml 53.05 ml/m LA Biplane Vol: 122.0 ml 61.06 ml/m  AORTIC VALVE AV Area (Vmax):    1.79 cm AV Area (Vmean):   1.78 cm AV Area (VTI):     2.04 cm AV Vmax:           84.23 cm/s AV Vmean:          56.200 cm/s AV VTI:            0.145 m AV Peak Grad:      2.8 mmHg AV Mean Grad:      1.7 mmHg LVOT Vmax:         47.90 cm/s LVOT Vmean:        31.900 cm/s LVOT VTI:          0.094 m LVOT/AV VTI ratio: 0.65  AORTA Ao Root diam: 3.60 cm MITRAL VALVE               TRICUSPID VALVE MV Area (PHT): 5.93 cm    TR Peak grad:   29.4 mmHg MV Decel Time: 128 msec    TR Vmax:        271.00 cm/s MV E velocity: 69.20 cm/s MV A velocity: 26.70 cm/s  SHUNTS MV E/A ratio:  2.59  Systemic VTI:  0.09 m                            Systemic Diam: 2.00 cm Lonni Hanson MD Electronically signed by  Lonni Hanson MD Signature Date/Time: 05/15/2024/5:11:08 PM    Final    CT Chest Wo Contrast Result Date: 05/15/2024 EXAM: CT CHEST WITHOUT CONTRAST 05/15/2024 02:40:08 AM TECHNIQUE: CT of the chest was performed without the administration of intravenous contrast. Multiplanar reformatted images are provided for review. Automated exposure control, iterative reconstruction, and/or weight based adjustment of the mA/kV was utilized to reduce the radiation dose to as low as reasonably achievable. COMPARISON: Chest radiograph earlier today. CLINICAL HISTORY: SOB, clear CXR, appears volume overloaded on exam. BIBA due to feeling SOB and difficulty breathing. Pt has hx of CHF, HTN and stage 4 kidney disease. Has edema to left leg. Less than has been pulled off him. External purewick on when he arrived. EMS states fluid on lungs and wet cough. FINDINGS: MEDIASTINUM: Mild cardiomegaly. Moderate 3-vessel coronary atherosclerosis. LYMPH NODES: No mediastinal, hilar or axillary lymphadenopathy. LUNGS AND PLEURA: Mild dependent atelectasis in the bilateral lower lobes. No frank interstitial edema, noting motion degradation. calcified granuloma in the right middle lobe, benign 3 mm noncalcified nodule in the right upper lobe ( image 24 ), likely benign. No follow up is recommended. Small bilateral pleural effusions. No pneumothorax. SOFT TISSUES/BONES: Degenerative changes of the visualized thoracolumbar spine. UPPER ABDOMEN: Status post cholecystectomy. Bilateral Nonobstructing renal calculi measuring up to 11 mm in the left lower kidney (image 164). Bilateral simple renal cyst, measuring up to 5.8 cm in the left renal sinus (image 148), benign (Bosniak 1), no follow-up is recommended. Probable hemorrhagic cysts measuring up to 1.7 cm in the left lower kidney (image 156), likely benign but technically indeterminate/incompletely visualized. Given the patient's age, dedicated follow-up is considered optional.  Atherosclerotic calcifications of the abdominal aorta and branch vessels. IMPRESSION: 1. Mild cardiomegaly.  No frank interstitial edema, noting motion degradation. 2. Small bilateral pleural effusions. 3. Mild dependent atelectasis in the bilateral lower lobes. Electronically signed by: Pinkie Pebbles MD 05/15/2024 02:48 AM EDT RP Workstation: [id number]   DG Chest Portable 1 View Result Date: 05/15/2024 EXAM: 1 VIEW XRAY OF THE CHEST 05/15/2024 01:59:33 AM COMPARISON: 06/12/2023 CLINICAL HISTORY: SOB. PER ER NOTE; BIBA due to feeling SOB and difficulty breathing. Pt has hx of CHF, HTN and stage 4 kidney disease. Has edema to left leg. Less than has been pulled off him. External purewick on when he arrived. EMS states fluid on lungs and wet cough. FINDINGS: LUNGS AND PLEURA: No frank interstitial edema. No pleural effusion or pneumothorax. HEART AND MEDIASTINUM: Cardiomegaly. Calcified aorta. BONES AND SOFT TISSUES: No acute osseous abnormality. Mild eventration of the right hemidiaphragm. IMPRESSION: 1. No acute findings. 2. Cardiomegaly. No frank interstitial edema. Electronically signed by: Pinkie Pebbles MD 05/15/2024 02:03 AM EDT RP Workstation: [id number]   US  Venous Img Lower Bilateral Result Date: 05/15/2024 EXAM: ULTRASOUND DUPLEX OF THE BILATERAL LOWER EXTREMITY VEINS TECHNIQUE: Duplex ultrasound using B-mode/gray scaled imaging and Doppler spectral analysis and color flow was obtained of the deep venous structures of the bilateral lower extremity. COMPARISON: None. CLINICAL HISTORY: Leg swelling, worse on left side. FINDINGS: The visualized veins of the lower extremity are patent and free of echogenic thrombus. The veins demonstrate good compressibility with normal color flow study and spectral analysis. IMPRESSION: 1. No evidence of DVT in the  bilateral lower extremities. Electronically signed by: Pinkie Pebbles MD 05/15/2024 01:05 AM EDT RP Workstation: [id number]    ECHO as  above  TELEMETRY reviewed by me 05/19/2024: atrial fibrillation, rate 80-90s   EKG reviewed by me:  atrial fibrillation, rate 80 bpm without acute ischemic changes.   Data reviewed by me 05/19/2024: last 24h vitals tele labs imaging I/O hospitalist progress notes.  Principal Problem:   Acute CHF (congestive heart failure) (HCC) Active Problems:   Shortness of breath    ASSESSMENT AND PLAN:  George Bolton is a 83 y.o. male  from [facility] [employer] [facility] [facility], bedbound with a past medical history of chronic HFpEF, coronary artery disease, paroxsymal atrial fibrillation, hx rheumatic fever, hx CVA, hypertension, hyperlipidemia, CKD, BPH, lymphedema, chronic gout  who presented to the ED on 05/15/2024 for worsening shortness of breath, lower extremity edema and decreased UOP. Echo this admission revealed newly reduced EF of 45-50%. Cardiology was consulted for further evaluation.   # Acute on chronic HFpEF (Newly mildly reduced this admission 45-50%) BNP elevated at 850. CXR with cardiomegaly. CT chest with small bilateral pleural effusions. Echo this admission with newly reduced EF of 45-50% with global hypokinesis, mildly decreased function of LV, mod LVH, mild to mod TR. Patient appears near euvolemic - Continue PO torsemide  20 mg daily. -Continue home Coreg  6.25 BID -Continue home empagliflozin  10 mg daily.  -Continue losartan  50 mg daily. Uptitrate as BP allows.  -Consider addition of spirolactone at outpatient follow-up if renal function allows.   # Coronary artery disease # Hypertension # Hyperlipidemia Patient without chest pain. Trops minimally elevated and flat 27 > 30 is most consistent with demand ischemia, not ACS in setting of AoCHFpEF. EKG without acute ischemic changes.  -Continue home Imdur  30 mg daily. -Continue home rosuvastatin  10 mg daily.  -Continue Coreg  as stated above.  -Losartan  as stated above.   # Paroxsymal atrial fibrillation EKG this admission atrial  fibrillation, rate 80 bpm. Per tele remains in AF with controlled rates.  -Continue home Eliquis  for stroke risk reduction. -Continue Coreg  as stated above.  No further cardiac recommendations at this time. Cardiology will sign off. Please haiku with questions or re-engage if needed.   This patient's plan of care was discussed and created with Dr. Ammon and he is in agreement.  Signed: Dorene Comfort, PA-C  05/19/2024, 11:25 AM [facility] Cardiology

## 2024-05-19 NOTE — TOC Transition Note (Signed)
 Transition of Care [facility]) - Discharge Note   Patient Details  Name: George Bolton MRN: [id number] Date of Birth: February 01, 1941  Transition of Care [facility]) CM/SW Contact:  Jefferie Holston C Val Schiavo, RN Phone Number: 05/19/2024, 12:30 PM   Clinical Narrative:     Beatris with [facility] from [facility] to advised of discharge today. [facility] will transport [facility] home.   Spoke with patient son to advised patient is discharging to [facility] and then will return to home.   TOC signing off.         Patient Goals and CMS Choice            Discharge Placement                       Discharge Plan and Services Additional resources added to the After Visit Summary for                                       Social Drivers of Health (SDOH) Interventions SDOH Screenings   Food Insecurity: No Food Insecurity (05/15/2024)  Housing: Low Risk  (05/15/2024)  Transportation Needs: No Transportation Needs (05/15/2024)  Utilities: Not At Risk (05/15/2024)  Social Connections: Moderately Isolated (05/15/2024)  Tobacco Use: Medium Risk (05/15/2024)     Readmission Risk Interventions    09/20/2022   10:22 AM  Readmission Risk Prevention Plan  Post Dischage Appt Complete  Medication Screening Complete  Transportation Screening Complete

## 2024-05-19 NOTE — Discharge Summary (Signed)
 Physician Discharge Summary   Patient: George Bolton MRN: [id number] DOB: 10/01/1941  Admit date:     05/15/2024  Discharge date: 05/19/24  Discharge Physician: Amaryllis Dare   PCP: Cecillia Landry RAMAN, MD   Recommendations at discharge:  Please obtain CBC and BMP on follow-up Please hold torsemide  for 1 to 2 days and repeat renal function, you can resume once creatinine stable and baseline. Follow-up with primary care provider Follow-up with cardiology  Discharge Diagnoses: Principal Problem:   Acute CHF (congestive heart failure) (HCC) Active Problems:   Shortness of breath   Hospital Course: PRIEST LOCKRIDGE is a 83 y.o. male with medical history significant of HFpEF, atrial fibrillation on Eliquis , gout, hypertension, chronic lower extremity lymphedema, CKD 4, diabetes mellitus 2, NSVT, prior history of stroke with mild right upper extremity deficits involving the hand, dyslipidemia, CAD and BPH.  Presented to the ED via EMS reporting shortness of breath and difficulty breathing.  Noted with lower extremity bilateral edema.  Paramedics report that pulmonary exam prior to arrival consistent with volume overload with diffuse crackles.  Resting room air sats were 96%.  Lower extremity venous duplex negative for DVT.  Chest x-ray demonstrated cardiomegaly but no frank interstitial edema.  CT of the chest revealed no edema but there were small bilateral pleural effusions and mild dependent atelectasis in the bilateral lower lobes.  BNP was elevated at 856, troponin mildly elevated at 27 and 30 with flat trend.  D-dimer slightly elevated at 0.56.  PCR for COVID, flu and RSV negative.  Room air sats have been variable between 93 and 100%.  Diastolic blood pressures have been elevated in the 90 range.  Patient was admitted for acute on chronic CHF.  Repeat echocardiogram with new diagnosis of HFrEF with a EF of 45 to 50% so cardiology was consulted.  Patient was initially diuresed with IV Lasix   and later switched to torsemide  when creatinine started trending up.  He is being discharged on torsemide , can hold the dose for 1 to 2 days to see stable renal function before resuming.  Clinically appears euvolemic.  Patient was also found to have mildly elevated troponin with no acute changes on EKG.  Troponin with a flat curve likely due to demand ischemia from heart failure exacerbation.  History of chronic atrial fibrillation, home metoprolol  was switched with Cardizem  and he will continue Eliquis .  Patient also has stable and chronic thrombocytopenia.  No sign of any bleeding.  Patient will continue current management for hypertension and PCP can monitor and make changes as appropriate.  Patient will continue home regimen for diabetes mellitus, current A1c of 6.2.  Patient is basically bed and wheelchair bound at baseline.  [facility] will provide PT and further assistance.  He will continue the rest of his home medications and follow-up with his providers for further assistance   Consultants: Cardiology Procedures performed: None Disposition: [facility] program Diet recommendation:  Discharge Diet Orders (From admission, onward)     Start     Ordered   05/19/24 0000  Diet - low sodium heart healthy        05/19/24 1038           Cardiac and Carb modified diet DISCHARGE MEDICATION: Allergies as of 05/19/2024       Reactions   Cortisone Anaphylaxis   Triamcinolone    Other reaction(s): Unknown        Medication List     STOP taking these medications  furosemide  40 MG tablet Commonly known as: LASIX    hydrALAZINE  50 MG tablet Commonly known as: APRESOLINE    metoprolol  succinate 100 MG 24 hr tablet Commonly known as: TOPROL -XL   traMADol  50 MG tablet Commonly known as: ULTRAM        TAKE these medications    acetaminophen  650 MG CR tablet Commonly known as: TYLENOL  Take 1,300 mg by mouth 2 (two) times daily.   allopurinol  100 MG tablet Commonly  known as: ZYLOPRIM  Take 1 tablet (100 mg total) by mouth daily. Home med.   apixaban  2.5 MG Tabs tablet Commonly known as: ELIQUIS  Take 2.5 mg by mouth 2 (two) times daily.   bisacodyl  5 MG EC tablet Commonly known as: DULCOLAX Take 2 tablets (10 mg total) by mouth at bedtime. Skip the dose if no constipation   carvedilol  6.25 MG tablet Commonly known as: COREG  Take 6.25 mg by mouth 2 (two) times daily. Take along with one 12.5 mg tablet for total 18.75 mg twice daily What changed: Another medication with the same name was removed. Continue taking this medication, and follow the directions you see here.   cloNIDine  0.3 mg/24hr patch Commonly known as: CATAPRES  - Dosed in mg/24 hr Place 0.3 mg onto the skin once a week.   fexofenadine 180 MG tablet Commonly known as: ALLEGRA Take 180 mg by mouth daily.   finasteride  5 MG tablet Commonly known as: PROSCAR  Take 1 tablet (5 mg total) by mouth daily.   isosorbide  mononitrate 30 MG 24 hr tablet Commonly known as: IMDUR  Take 1 tablet (30 mg total) by mouth daily. Hold if SBP <130 mmHg   Jardiance  10 MG Tabs tablet Generic drug: empagliflozin  Take 10 mg by mouth daily.   losartan  50 MG tablet Commonly known as: COZAAR  Take 1 tablet (50 mg total) by mouth daily.   mirtazapine  7.5 MG tablet Commonly known as: REMERON  Take 7.5 mg by mouth at bedtime.   nystatin  powder Commonly known as: MYCOSTATIN /NYSTOP  Apply 1 Application topically 2 (two) times daily.   polyethylene glycol 17 g packet Commonly known as: MIRALAX  / GLYCOLAX  Take 17 g by mouth 2 (two) times daily. Skip the dose if no constipation   rosuvastatin  20 MG tablet Commonly known as: Crestor  Take 1 tablet (20 mg total) by mouth daily.   tamsulosin  0.4 MG Caps capsule Commonly known as: FLOMAX  Take 1 capsule (0.4 mg total) by mouth daily after supper.   torsemide  20 MG tablet Commonly known as: DEMADEX  Take 1 tablet (20 mg total) by mouth daily.         Follow-up Information     George Bolton, [name], PA-C. Go in 1 week(s).   Specialty: Cardiology Why: 80/01 @ 10:00 AM Contact information: [address] [place] [address] [phone]         Florencio Cara BIRCH, MD. Go in 2 week(s).   Specialties: Cardiology, Internal Medicine Contact information: [address] [place] [address] [phone]         Cecillia Landry RAMAN, MD. Schedule an appointment as soon as possible for a visit in 1 week(s).   Specialty: Internal Medicine Contact information: [address] [place] [address] [phone]                Discharge Exam: Fredricka Weights   05/17/24 0500 05/18/24 0400 05/19/24 0522  Weight: 72.7 kg 72.6 kg 68.7 kg   General.  Frail elderly man, in no acute distress. Pulmonary.  Lungs clear bilaterally, normal respiratory effort.  CV.  Irregularly irregular Abdomen.  Soft, nontender, nondistended, BS positive. CNS.  Alert and oriented .  No focal neurologic deficit. Extremities.  No edema, pulses intact and symmetrical.   Condition at discharge: stable  The results of significant diagnostics from this hospitalization (including imaging, microbiology, ancillary and laboratory) are listed below for reference.   Imaging Studies: CT HEAD WO CONTRAST ( ) Result Date: 05/17/2024 CLINICAL DATA:  Neuro deficit, acute, stroke suspected EXAM: CT HEAD WITHOUT CONTRAST TECHNIQUE: Contiguous axial images were obtained from the base of the skull through the vertex without intravenous contrast. RADIATION DOSE REDUCTION: This exam was performed according to the departmental dose-optimization program which includes automated exposure control, adjustment of the mA and/or kV according to patient size and/or use of iterative reconstruction technique. COMPARISON:  CT of the head dated December 10, 2021. FINDINGS: Brain: The patient's head is poorly position within the computer gantry. There is age related cerebral volume  loss and there is diffuse cerebral white matter disease. There are chronic encephalomalacia changes within the left occipital lobe. There is no evidence of hemorrhage, mass or acute cortical infarct. Vascular: Negative. Skull: Intact and unremarkable. Sinuses/Orbits: Complete opacification of the right maxillary sinus with dense secretions. Mild polypoid mucosal disease within the floor of the left maxillary sinus. Normal orbits. Other: None. IMPRESSION: 1. Technically limited study demonstrating generalized cerebral volume loss and moderate diffuse cerebral white matter disease. No apparent acute abnormality. 2. 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 3. Bilateral maxillary sinus disease. Electronically Signed   By: Evalene Coho M.D.   On: 05/17/2024 13:33   ECHOCARDIOGRAM COMPLETE Result Date: 05/15/2024    ECHOCARDIOGRAM REPORT   Patient Name:   BENJERMIN KORBER Date of Exam: 05/15/2024 Medical Rec #:  [id number]       Height:       73.0 in Accession #:    [id number]      Weight:       168.0 lb Date of Birth:  10-09-1941       BSA:          1.998 m Patient Age:    83 years        BP:           153/93 mmHg Patient Gender: M               HR:           78 bpm. Exam Location:  [facility] Procedure: 2D Echo, Color Doppler and Cardiac Doppler (Both Spectral and Color            Flow Doppler were utilized during procedure). Indications:     CHF-acute diastolic I50.31  History:         Patient has prior history of Echocardiogram examinations, most                  recent 09/19/2022. Arrythmias:Atrial Fibrillation; Risk                  Factors:Hypertension.  Sonographer:     Christopher Furnace Referring Phys:  [id number] ISAIAH CROME ELLIS Diagnosing Phys: Lonni Hanson MD IMPRESSIONS  1. Left ventricular ejection fraction, by estimation, is 45 to 50%. The left ventricle has mildly decreased function. The left ventricle demonstrates global hypokinesis. There is moderate left ventricular hypertrophy. Left ventricular  diastolic parameters are indeterminate.  2. Right ventricular systolic function is moderately reduced. The right ventricular size is normal. There is mildly elevated pulmonary artery systolic pressure.  3. Left atrial size was severely dilated.  4. Right atrial size was mildly dilated.  5. The mitral valve is normal in structure. Trivial mitral valve regurgitation.  6. Tricuspid valve regurgitation is mild to moderate.  7. The aortic valve is tricuspid. There is mild thickening of the aortic valve. Aortic valve regurgitation is trivial. Aortic valve sclerosis is present, with no evidence of aortic valve stenosis.  8. The inferior vena cava is dilated in size with <50% respiratory variability, suggesting right atrial pressure of 15 mmHg. FINDINGS  Left Ventricle: Left ventricular ejection fraction, by estimation, is 45 to 50%. The left ventricle has mildly decreased function. The left ventricle demonstrates global hypokinesis. The left ventricular internal cavity size was normal in size. There is  moderate left ventricular hypertrophy. Left ventricular diastolic parameters are indeterminate. Right Ventricle: The right ventricular size is normal. No increase in right ventricular wall thickness. Right ventricular systolic function is moderately reduced. There is mildly elevated pulmonary artery systolic pressure. The tricuspid regurgitant velocity is 2.71 m/s, and with an assumed right atrial pressure of 15 mmHg, the estimated right ventricular systolic pressure is 44.4 mmHg. Left Atrium: Left atrial size was severely dilated. Right Atrium: Right atrial size was mildly dilated. Pericardium: There is no evidence of pericardial effusion. Mitral Valve: The mitral valve is normal in structure. Trivial mitral valve regurgitation. Tricuspid Valve: The tricuspid valve is normal in structure. Tricuspid valve regurgitation is mild to moderate. Aortic Valve: The aortic valve is tricuspid. There is mild thickening of the aortic  valve. Aortic valve regurgitation is trivial. Aortic valve sclerosis is present, with no evidence of aortic valve stenosis. Aortic valve mean gradient measures 1.7 mmHg. Aortic valve peak gradient measures 2.8 mmHg. Aortic valve area, by VTI measures 2.04 cm. Pulmonic Valve: The pulmonic valve was normal in structure. Pulmonic valve regurgitation is trivial. No evidence of pulmonic stenosis. Aorta: The aortic root is normal in size and structure. Pulmonary Artery: The pulmonary artery is of normal size. Venous: The inferior vena cava is dilated in size with less than 50% respiratory variability, suggesting right atrial pressure of 15 mmHg. IAS/Shunts: The interatrial septum was not well visualized.  LEFT VENTRICLE PLAX 2D LVIDd:         4.70 cm LVIDs:         3.40 cm LV PW:         1.30 cm LV IVS:        1.48 cm LVOT diam:     2.00 cm LV SV:         30 LV SV Index:   15 LVOT Area:     3.14 cm  LV Volumes (MOD) LV vol d, MOD A2C: 44.3 ml LV vol d, MOD A4C: 41.6 ml LV vol s, MOD A2C: 26.4 ml LV vol s, MOD A4C: 25.2 ml LV SV MOD A2C:     18.0 ml LV SV MOD A4C:     41.6 ml LV SV MOD BP:      17.2 ml RIGHT VENTRICLE RV Basal diam:  3.90 cm RV Mid diam:    3.00 cm RV S prime:     6.64 cm/s TAPSE (M-mode): 0.9 cm LEFT ATRIUM              Index        RIGHT ATRIUM           Index LA diam:        4.80 cm  2.40 cm/m   RA  Area:     19.10 cm LA Vol (A2C):   129.0 ml 64.57 ml/m  RA Volume:   52.10 ml  26.08 ml/m LA Vol (A4C):   106.0 ml 53.05 ml/m LA Biplane Vol: 122.0 ml 61.06 ml/m  AORTIC VALVE AV Area (Vmax):    1.79 cm AV Area (Vmean):   1.78 cm AV Area (VTI):     2.04 cm AV Vmax:           84.23 cm/s AV Vmean:          56.200 cm/s AV VTI:            0.145 m AV Peak Grad:      2.8 mmHg AV Mean Grad:      1.7 mmHg LVOT Vmax:         47.90 cm/s LVOT Vmean:        31.900 cm/s LVOT VTI:          0.094 m LVOT/AV VTI ratio: 0.65  AORTA Ao Root diam: 3.60 cm MITRAL VALVE               TRICUSPID VALVE MV Area (PHT): 5.93  cm    TR Peak grad:   29.4 mmHg MV Decel Time: 128 msec    TR Vmax:        271.00 cm/s MV E velocity: 69.20 cm/s MV A velocity: 26.70 cm/s  SHUNTS MV E/A ratio:  2.59        Systemic VTI:  0.09 m                            Systemic Diam: 2.00 cm Lonni Hanson MD Electronically signed by Lonni Hanson MD Signature Date/Time: 05/15/2024/5:11:08 PM    Final    CT Chest Wo Contrast Result Date: 05/15/2024 EXAM: CT CHEST WITHOUT CONTRAST 05/15/2024 02:40:08 AM TECHNIQUE: CT of the chest was performed without the administration of intravenous contrast. Multiplanar reformatted images are provided for review. Automated exposure control, iterative reconstruction, and/or weight based adjustment of the mA/kV was utilized to reduce the radiation dose to as low as reasonably achievable. COMPARISON: Chest radiograph earlier today. CLINICAL HISTORY: SOB, clear CXR, appears volume overloaded on exam. BIBA due to feeling SOB and difficulty breathing. Pt has hx of CHF, HTN and stage 4 kidney disease. Has edema to left leg. Less than has been pulled off him. External purewick on when he arrived. EMS states fluid on lungs and wet cough. FINDINGS: MEDIASTINUM: Mild cardiomegaly. Moderate 3-vessel coronary atherosclerosis. LYMPH NODES: No mediastinal, hilar or axillary lymphadenopathy. LUNGS AND PLEURA: Mild dependent atelectasis in the bilateral lower lobes. No frank interstitial edema, noting motion degradation. calcified granuloma in the right middle lobe, benign 3 mm noncalcified nodule in the right upper lobe ( image 24 ), likely benign. No follow up is recommended. Small bilateral pleural effusions. No pneumothorax. SOFT TISSUES/BONES: Degenerative changes of the visualized thoracolumbar spine. UPPER ABDOMEN: Status post cholecystectomy. Bilateral Nonobstructing renal calculi measuring up to 11 mm in the left lower kidney (image 164). Bilateral simple renal cyst, measuring up to 5.8 cm in the left renal sinus (image  148), benign (Bosniak 1), no follow-up is recommended. Probable hemorrhagic cysts measuring up to 1.7 cm in the left lower kidney (image 156), likely benign but technically indeterminate/incompletely visualized. Given the patient's age, dedicated follow-up is considered optional. Atherosclerotic calcifications of the abdominal aorta and branch vessels. IMPRESSION: 1. Mild cardiomegaly.  No frank  interstitial edema, noting motion degradation. 2. Small bilateral pleural effusions. 3. Mild dependent atelectasis in the bilateral lower lobes. Electronically signed by: Pinkie Pebbles MD 05/15/2024 02:48 AM EDT RP Workstation: [id number]   DG Chest Portable 1 View Result Date: 05/15/2024 EXAM: 1 VIEW XRAY OF THE CHEST 05/15/2024 01:59:33 AM COMPARISON: 06/12/2023 CLINICAL HISTORY: SOB. PER ER NOTE; BIBA due to feeling SOB and difficulty breathing. Pt has hx of CHF, HTN and stage 4 kidney disease. Has edema to left leg. Less than has been pulled off him. External purewick on when he arrived. EMS states fluid on lungs and wet cough. FINDINGS: LUNGS AND PLEURA: No frank interstitial edema. No pleural effusion or pneumothorax. HEART AND MEDIASTINUM: Cardiomegaly. Calcified aorta. BONES AND SOFT TISSUES: No acute osseous abnormality. Mild eventration of the right hemidiaphragm. IMPRESSION: 1. No acute findings. 2. Cardiomegaly. No frank interstitial edema. Electronically signed by: Pinkie Pebbles MD 05/15/2024 02:03 AM EDT RP Workstation: [id number]   US  Venous Img Lower Bilateral Result Date: 05/15/2024 EXAM: ULTRASOUND DUPLEX OF THE BILATERAL LOWER EXTREMITY VEINS TECHNIQUE: Duplex ultrasound using B-mode/gray scaled imaging and Doppler spectral analysis and color flow was obtained of the deep venous structures of the bilateral lower extremity. COMPARISON: None. CLINICAL HISTORY: Leg swelling, worse on left side. FINDINGS: The visualized veins of the lower extremity are patent and free of echogenic thrombus.  The veins demonstrate good compressibility with normal color flow study and spectral analysis. IMPRESSION: 1. No evidence of DVT in the bilateral lower extremities. Electronically signed by: Pinkie Pebbles MD 05/15/2024 01:05 AM EDT RP Workstation: [id number]    Microbiology: Results for orders placed or performed during the hospital encounter of 05/15/24  Resp panel by RT-PCR (RSV, Flu A&B, Covid) Anterior Nasal Swab     Status: None   Collection Time: 05/15/24 12:30 AM   Specimen: Anterior Nasal Swab  Result Value Ref Range Status   SARS Coronavirus 2 by RT PCR NEGATIVE NEGATIVE Final    Comment: (NOTE) SARS-CoV-2 target nucleic acids are NOT DETECTED.  The SARS-CoV-2 RNA is generally detectable in upper respiratory specimens during the acute phase of infection. The lowest concentration of SARS-CoV-2 viral copies this assay can detect is 138 copies/mL. A negative result does not preclude SARS-Cov-2 infection and should not be used as the sole basis for treatment or other patient management decisions. A negative result may occur with  improper specimen collection/handling, submission of specimen other than nasopharyngeal swab, presence of viral mutation(s) within the areas targeted by this assay, and inadequate number of viral copies(<138 copies/mL). A negative result must be combined with clinical observations, patient history, and epidemiological information. The expected result is Negative.  Fact Sheet for Patients:  BloggerCourse.com  Fact Sheet for Healthcare Providers:  SeriousBroker.it  This test is no t yet approved or cleared by the [place]  FDA and  has been authorized for detection and/or diagnosis of SARS-CoV-2 by FDA under an Emergency Use Authorization (EUA). This EUA will remain  in effect (meaning this test can be used) for the duration of the COVID-19 declaration under Section 564(b)(1) of the Act,  21 U.S.C.section 360bbb-3(b)(1), unless the authorization is terminated  or revoked sooner.       Influenza A by PCR NEGATIVE NEGATIVE Final   Influenza B by PCR NEGATIVE NEGATIVE Final    Comment: (NOTE) The Xpert Xpress SARS-CoV-2/FLU/RSV plus assay is intended as an aid in the diagnosis of influenza from Nasopharyngeal swab specimens and should not be used as a sole basis  for treatment. Nasal washings and aspirates are unacceptable for Xpert Xpress SARS-CoV-2/FLU/RSV testing.  Fact Sheet for Patients: BloggerCourse.com  Fact Sheet for Healthcare Providers: SeriousBroker.it  This test is not yet approved or cleared by the [place]  FDA and has been authorized for detection and/or diagnosis of SARS-CoV-2 by FDA under an Emergency Use Authorization (EUA). This EUA will remain in effect (meaning this test can be used) for the duration of the COVID-19 declaration under Section 564(b)(1) of the Act, 21 U.S.C. section 360bbb-3(b)(1), unless the authorization is terminated or revoked.     Resp Syncytial Virus by PCR NEGATIVE NEGATIVE Final    Comment: (NOTE) Fact Sheet for Patients: BloggerCourse.com  Fact Sheet for Healthcare Providers: SeriousBroker.it  This test is not yet approved or cleared by the [place]  FDA and has been authorized for detection and/or diagnosis of SARS-CoV-2 by FDA under an Emergency Use Authorization (EUA). This EUA will remain in effect (meaning this test can be used) for the duration of the COVID-19 declaration under Section 564(b)(1) of the Act, 21 U.S.C. section 360bbb-3(b)(1), unless the authorization is terminated or revoked.  Performed at [facility], [address] [place], [place] [address]     Labs: CBC: Recent Labs  Lab 05/15/24 0030 05/17/24 0438 05/18/24 0307  WBC 6.3 6.7 6.8  NEUTROABS 4.5 5.1  --   HGB  11.8* 12.5* 13.0  HCT 38.1* 38.9* 40.4  MCV 90.5 86.6 88.8  PLT 108* 138* 113*   Basic Metabolic Panel: Recent Labs  Lab 05/15/24 0030 05/16/24 0442 05/17/24 0438 05/18/24 0307 05/19/24 0447  NA 141 142 140 143 144  K 4.7 3.9 4.1 4.6 4.0  CL 111 108 110 109 110  CO2 19* 22 19* 22 23  GLUCOSE 126* 100* 102* 109* 110*  BUN 36* 37* 38* 41* 44*  CREATININE 2.43* 2.26* 1.98* 2.10* 2.24*  CALCIUM  9.4 9.5 9.4 9.6 9.3   Liver Function Tests: No results for input(s): AST, ALT, ALKPHOS, BILITOT, PROT, ALBUMIN in the last 168 hours. CBG: Recent Labs  Lab 05/18/24 0733 05/18/24 1154 05/18/24 1545 05/18/24 2052 05/19/24 0806  GLUCAP 103* 163* 110* 121* 116*    Discharge time spent: greater than 30 minutes.  This record has been created using Conservation officer, historic buildings. Errors have been sought and corrected,but may not always be located. Such creation errors do not reflect on the standard of care.   Signed: Amaryllis Dare, MD [facility] 05/19/2024

## 2024-05-19 NOTE — Progress Notes (Signed)
 Physical Therapy Treatment Patient Details Name: George Bolton MRN: [id number] DOB: 07/28/1941 Today's Date: 05/19/2024   History of Present Illness George Bolton is a 83 y.o. male with medical history significant of HFpEF, atrial fibrillation on Eliquis , gout, hypertension, chronic lower extremity lymphedema, CKD 4, diabetes mellitus 2, prior history of stroke with mild right upper extremity deficits involving the hand. Presented to the ED via EMS reporting shortness of breath and difficulty breathing.    PT Comments  Patient alert, oriented to self only. Demonstrated mild pain signs/symptoms with mobility. maxA to come to sitting EOB though pt is able to initiate movement, hand over hand assistance to technique and to use bed rail. Sit <> Stand twice from EOB with stedy, modAx2. Able to clear buttocks from bed but not enough to place stedy flaps. Pt fatigued with activity, returned to supine maxAx2 for safety and repositioned for comfort. The patient would benefit from further skilled PT intervention to continue to progress towards goals.     If plan is discharge home, recommend the following: A lot of help with walking and/or transfers;A lot of help with bathing/dressing/bathroom;Assist for transportation;Help with stairs or ramp for entrance;Supervision due to cognitive status   Can travel by private vehicle     No  Equipment Recommendations  Hoyer lift    Recommendations for Other Services       Precautions / Restrictions Precautions Precautions: Fall Recall of Precautions/Restrictions: Impaired Restrictions Weight Bearing Restrictions Per Provider Order: No     Mobility  Bed Mobility Overal bed mobility: Needs Assistance Bed Mobility: Supine to Sit, Sit to Supine     Supine to sit: Max assist, HOB elevated, Used rails Sit to supine: Max assist, Used rails, HOB elevated, +2 for safety/equipment        Transfers Overall transfer level: Needs assistance Equipment  used:  (stedy) Transfers: Sit to/from Stand Sit to Stand: +2 physical assistance, Via lift equipment, Mod assist           General transfer comment: able to clear buttocks from bed with assistance but not far enough to place flaps of stedy Transfer via Lift Equipment: Stedy  Ambulation/Gait               General Gait Details: pt non-ambulatory at baseline   Stairs             Wheelchair Mobility     Tilt Bed    Modified Rankin (Stroke Patients Only)       Balance Overall balance assessment: Needs assistance Sitting-balance support: Bilateral upper extremity supported, Feet supported Sitting balance-Leahy Scale: Zero Sitting balance - Comments: progressed to CGA-minA with BUE support on stedy Postural control: Posterior lean, Right lateral lean Standing balance support: Bilateral upper extremity supported Standing balance-Leahy Scale: Zero                              Communication Communication Communication: Impaired Factors Affecting Communication: Reduced clarity of speech  Cognition Arousal: Alert Behavior During Therapy: WFL for tasks assessed/performed, Flat affect   PT - Cognitive impairments: Difficult to assess, Orientation, Awareness, No family/caregiver present to determine baseline Difficult to assess due to: Impaired communication Orientation impairments: Time, Situation, Place                   PT - Cognition Comments: orientated to self Following commands: Impaired Following commands impaired: Follows one step commands inconsistently, Follows one step commands  with increased time    Cueing Cueing Techniques: Verbal cues, Gestural cues, Tactile cues  Exercises      General Comments        Pertinent Vitals/Pain Pain Assessment Pain Assessment: Faces Faces Pain Scale: Hurts a little bit Pain Location: B knees Pain Descriptors / Indicators: Discomfort, Grimacing Pain Intervention(s): Limited activity within  patient's tolerance, Monitored during session, Repositioned    Home Living                          Prior Function            PT Goals (current goals can now be found in the care plan section) Progress towards PT goals: Progressing toward goals    Frequency    Min 2X/week      PT Plan      Co-evaluation              AM-PAC PT 6 Clicks Mobility   Outcome Measure  Help needed turning from your back to your side while in a flat bed without using bedrails?: Total Help needed moving from lying on your back to sitting on the side of a flat bed without using bedrails?: Total Help needed moving to and from a bed to a chair (including a wheelchair)?: Total Help needed standing up from a chair using your arms (e.g., wheelchair or bedside chair)?: Total Help needed to walk in hospital room?: Total Help needed climbing 3-5 steps with a railing? : Total 6 Click Score: 6    End of Session   Activity Tolerance: Patient limited by fatigue Patient left: in bed;with call bell/phone within reach;with bed alarm set Nurse Communication: Mobility status PT Visit Diagnosis: Muscle weakness (generalized) (M62.81);Other abnormalities of gait and mobility (R26.89)     Time: 9042-8986 PT Time Calculation (min) (ACUTE ONLY): 16 min  Charges:    $Therapeutic Activity: 8-22 mins PT General Charges $$ ACUTE PT VISIT: 1 Visit                     Doyal Shams PT, DPT 11:03 AM,05/19/24

## 2024-05-19 NOTE — Plan of Care (Signed)
  Problem: Clinical Measurements: Goal: Ability to maintain clinical measurements within normal limits will improve Outcome: Progressing Goal: Will remain free from infection Outcome: Progressing Goal: Diagnostic test results will improve Outcome: Progressing Goal: Respiratory complications will improve Outcome: Progressing Goal: Cardiovascular complication will be avoided Outcome: Progressing   Problem: Nutrition: Goal: Adequate nutrition will be maintained Outcome: Progressing   Problem: Coping: Goal: Level of anxiety will decrease Outcome: Progressing   Problem: Elimination: Goal: Will not experience complications related to bowel motility Outcome: Progressing Goal: Will not experience complications related to urinary retention Outcome: Progressing   Problem: Pain Managment: Goal: General experience of comfort will improve and/or be controlled Outcome: Progressing

## 2024-08-25 ENCOUNTER — Other Ambulatory Visit: Payer: Self-pay | Admitting: Family Medicine

## 2024-08-25 DIAGNOSIS — N433 Hydrocele, unspecified: Secondary | ICD-10-CM

## 2024-09-11 ENCOUNTER — Ambulatory Visit
Admission: RE | Admit: 2024-09-11 | Discharge: 2024-09-11 | Disposition: A | Discharge status: Home / Self Care | Source: Ambulatory Visit | Attending: Family Medicine | Admitting: Family Medicine

## 2024-09-11 DIAGNOSIS — N433 Hydrocele, unspecified: Secondary | ICD-10-CM | POA: Diagnosis present

## 2024-09-19 NOTE — Progress Notes (Signed)
 09/20/2024 2:14 PM   George Bolton 30-Apr-1941 [id number]  Urological history 1. BPH with LU TS - PSA(2020) 5.5 -Finasteride  5 mg daily  2. Renal cysts -RUS (10/2021) - multiple cysts in the right kidney largest measuring 5.4 x 5 x 5.6 cm in the upper pole - multiple cysts of varying sizes largest measuring 5.7 x 4.9 x 5.1 cm  3. Hydroceles -scrotal ultrasound (10/2021) - Large left hydrocele - left epididymal cysts   4.  Nephrolithiasis -CT urogram (07/2019) - Multiple bilateral kidney stones without hydronephrosis.   Chief Complaint  Patient presents with   Benign Prostatic Hypertrophy    HPI: George Bolton is a 83 y.o. male who facility ordered a scrotal US  who presents with his son, George Bolton.  Previous records reviewed.     His son places a PureWick on him nightly and he will complain of discomfort at times.  A scrotal US  was ordered by [facility] on 09/11/2024 and noted a large complex left hydrocele, bilateral epididymal cysts, and right-sided varicocele.  Scrotal US  in 2023  large left hydrocele containing some minimal internal debris and left epididymal cysts are stable dating back to September 13, 2018.  Patient denies any modifying or aggravating factors.  Patient denies any recent UTI's, gross hematuria, dysuria or suprapubic/flank pain.  Patient denies any fevers, chills, nausea or vomiting.    PVR 495 mL   Serum creatinine (05/2024) 2.3, eGFR 27  Hemoglobin A1c (927974) 6.2  Diuretics: torsemide    BPH meds: finasteride  5 mg daily   PMH: Past Medical History:  Diagnosis Date   Acute embolic stroke (HCC) 11/26/2021   Chronic atrial fibrillation (HCC)    a. Afib dates back to at least 2002 when reviewing prior EKG with EKGs from 2014 onward showing Afib; b. CHADS2VASc at least 4 (CHF, HTN, age x 2)   Chronic diastolic CHF (congestive heart failure) (HCC)    a. TTE 5/19: EF 70-75%, mod LVH, near complete obliteration of the mid and apical LV cavity (can be  seen in apical hypertrophic CM), mildly dilated LA, RV cavity size nl, RV wall thickness nl, RVSF mildly reduced, mildly dilated RA; b. 02/2018 Limited echo w/ definity : EF 50-65%, no apical hypertrophic CM.   Community acquired bilateral lower lobe pneumonia 11/23/2021   Goals of care, counseling/discussion 12/11/2021   Gout    Hypertension    Hypertensive emergency 12/10/2021   Hypertensive urgency 07/16/2022    Surgical History: Past Surgical History:  Procedure Laterality Date   CHOLECYSTECTOMY N/A 01/13/2017   Procedure: LAPAROSCOPIC CHOLECYSTECTOMY;  Surgeon: Laneta JULIANNA Luna, MD;  Location: [facility] ORS;  Service: General;  Laterality: N/A;    Home Medications:  Allergies as of 09/20/2024       Reactions   Cortisone Anaphylaxis   Triamcinolone    Other reaction(s): Unknown        Medication List        Accurate as of September 20, 2024  2:14 PM. If you have any questions, ask your nurse or doctor.          acetaminophen  650 MG CR tablet Commonly known as: TYLENOL  Take 1,300 mg by mouth 2 (two) times daily.   allopurinol  100 MG tablet Commonly known as: ZYLOPRIM  Take 1 tablet (100 mg total) by mouth daily. Home med.   apixaban  2.5 MG Tabs tablet Commonly known as: ELIQUIS  Take 2.5 mg by mouth 2 (two) times daily.   bisacodyl  5 MG EC tablet Commonly known as: DULCOLAX Take  2 tablets (10 mg total) by mouth at bedtime. Skip the dose if no constipation   carvedilol  6.25 MG tablet Commonly known as: COREG  Take 6.25 mg by mouth 2 (two) times daily. Take along with one 12.5 mg tablet for total 18.75 mg twice daily   cloNIDine  0.3 mg/24hr patch Commonly known as: CATAPRES  - Dosed in mg/24 hr Place 0.3 mg onto the skin once a week.   fexofenadine 180 MG tablet Commonly known as: ALLEGRA Take 180 mg by mouth daily.   finasteride  5 MG tablet Commonly known as: PROSCAR  Take 1 tablet (5 mg total) by mouth daily.   isosorbide  mononitrate 30 MG 24 hr tablet Commonly  known as: IMDUR  Take 1 tablet (30 mg total) by mouth daily. Hold if SBP <130 mmHg   Jardiance  10 MG Tabs tablet Generic drug: empagliflozin  Take 10 mg by mouth daily.   losartan  50 MG tablet Commonly known as: COZAAR  Take 1 tablet (50 mg total) by mouth daily.   mirtazapine  7.5 MG tablet Commonly known as: REMERON  Take 7.5 mg by mouth at bedtime.   nystatin  powder Commonly known as: MYCOSTATIN /NYSTOP  Apply 1 Application topically 2 (two) times daily.   polyethylene glycol 17 g packet Commonly known as: MIRALAX  / GLYCOLAX  Take 17 g by mouth 2 (two) times daily. Skip the dose if no constipation   rosuvastatin  20 MG tablet Commonly known as: Crestor  Take 1 tablet (20 mg total) by mouth daily.   tamsulosin  0.4 MG Caps capsule Commonly known as: FLOMAX  Take 1 capsule (0.4 mg total) by mouth daily after supper.   torsemide  20 MG tablet Commonly known as: DEMADEX  Take 1 tablet (20 mg total) by mouth daily.        Allergies:  Allergies  Allergen Reactions   Cortisone Anaphylaxis   Triamcinolone     Other reaction(s): Unknown    Family History: Family History  Problem Relation Age of Onset   Stroke Mother     Social History:   reports that he has quit smoking. His smoking use included cigarettes. He has never used smokeless tobacco. He reports that he does not drink alcohol and does not use drugs.  Physical Exam: BP (!) 161/88 (BP Location: Left Arm, Patient Position: Sitting, Cuff Size: Large)   Pulse 83   SpO2 95%   Constitutional:  Well nourished. Alert and oriented, No acute distress. HEENT: [place] AT, moist mucus membranes.  Trachea midline Cardiovascular: No clubbing, cyanosis, or edema. Respiratory: Normal respiratory effort, no increased work of breathing. GU: No CVA tenderness.  No bladder fullness or masses.  Patient with uncircumcised phallus. Foreskin easily retracted  Urethral meatus is patent.  No penile discharge. No penile lesions or rashes. Scrotum  without lesions, cysts, rashes and/or edema.  Bilateral large hydroceles noted.  No erythema, induration, crepitus or fluctuance or drainage noted. Neurologic: Grossly intact, no focal deficits, moving all 4 extremities. Psychiatric: Normal mood and affect.   Laboratory Data: See Epic and HPI    I have reviewed the labs.   Pertinent imaging: CLINICAL DATA:  Hydrocele   EXAM: SCROTAL ULTRASOUND   DOPPLER ULTRASOUND OF THE TESTICLES   TECHNIQUE: Complete ultrasound examination of the testicles, epididymis, and other scrotal structures was performed. Color and spectral Doppler ultrasound were also utilized to evaluate blood flow to the testicles.   COMPARISON:  None Available.   FINDINGS: Right testicle   Measurements: 2.8 x 1.8 x 2.7 cm. No mass or microlithiasis visualized.   Left testicle  Measurements: 3.6 x 1.4 x 3.8 cm. No mass or microlithiasis visualized.   Right epididymis: A cluster of multiple epididymal cysts are identified. The largest cyst measures 2.4 cm.   Left epididymis: Multiple epididymal cysts are present. The largest cyst measures 1 cm.   Hydrocele: Trace right hydrocele. Large left complex fluid collection likely a hydrocele with internal debris, measuring approximately 13.4 x 7.8 x 8.7 cm.   Varicocele: Right sided varicocele. Unable to evaluate left side due to large hydrocele.   Pulsed Doppler interrogation of both testes demonstrates normal low resistance arterial and venous waveforms bilaterally.   IMPRESSION: Bilateral epididymal cysts.   Large complex left hydrocele. Differentials include a very large epididymal cyst mimicking hydrocele.   Right-sided varicocele.     Electronically Signed   By: Megan  Zare M.D.   On: 09/13/2024 12:03   09/20/24 14:47  Scan Result   I have independently reviewed the films.  See HPI.     Assessment & Plan:    1. Left hydrocele - recommend to optimize CHF therapy to euvolemic  state - given only septations and debris are noted and no solid components and stability over the last several years, conservative management is appropriate  - patient instructed to notify us  if the hydrocele causing significant discomfort or signs of infection   2. BPH with LU TS -  PVR > 300 cc  - encouraged avoiding bladder irritants, fluid restriction before bedtime and timed voiding's - Continue finasteride  5 mg daily  - Discussed placing a Foley catheter, but his son deferred stating that his father is not complaining of discomfort and they did not want a Foley catheter in place as it does increase his risk of recurrent UTIs and given his advanced age and comordibides I feel this reasonable   3. Renal cyst -Stable, recommend no further monitoring for these.  4. Kidney stones -Asymptomatic  Return if symptoms worsen or fail to improve.  George Bolton  [facility] [address], [address] [place], [place] [address] [phone]

## 2024-09-20 ENCOUNTER — Ambulatory Visit (INDEPENDENT_AMBULATORY_CARE_PROVIDER_SITE_OTHER): Admitting: Urology

## 2024-09-20 VITALS — BP 161/88 | HR 83

## 2024-09-20 DIAGNOSIS — N200 Calculus of kidney: Secondary | ICD-10-CM | POA: Diagnosis not present

## 2024-09-20 DIAGNOSIS — N433 Hydrocele, unspecified: Secondary | ICD-10-CM

## 2024-09-20 DIAGNOSIS — N401 Enlarged prostate with lower urinary tract symptoms: Secondary | ICD-10-CM

## 2024-09-20 DIAGNOSIS — N138 Other obstructive and reflux uropathy: Secondary | ICD-10-CM

## 2024-09-20 DIAGNOSIS — N281 Cyst of kidney, acquired: Secondary | ICD-10-CM

## 2024-09-20 LAB — BLADDER SCAN AMB NON-IMAGING

## 2024-09-22 ENCOUNTER — Encounter: Payer: Self-pay | Admitting: Urology

## 2024-10-02 ENCOUNTER — Other Ambulatory Visit: Payer: Self-pay

## 2024-10-02 ENCOUNTER — Emergency Department

## 2024-10-02 ENCOUNTER — Observation Stay
Admission: EM | Admit: 2024-10-02 | Discharge: 2024-10-06 | DRG: 291 | Disposition: A | Attending: Obstetrics and Gynecology | Admitting: Obstetrics and Gynecology

## 2024-10-02 DIAGNOSIS — J810 Acute pulmonary edema: Principal | ICD-10-CM

## 2024-10-02 DIAGNOSIS — I10 Essential (primary) hypertension: Secondary | ICD-10-CM | POA: Diagnosis present

## 2024-10-02 DIAGNOSIS — R7989 Other specified abnormal findings of blood chemistry: Secondary | ICD-10-CM | POA: Diagnosis present

## 2024-10-02 DIAGNOSIS — J9601 Acute respiratory failure with hypoxia: Secondary | ICD-10-CM | POA: Diagnosis not present

## 2024-10-02 DIAGNOSIS — I482 Chronic atrial fibrillation, unspecified: Secondary | ICD-10-CM | POA: Diagnosis present

## 2024-10-02 DIAGNOSIS — N400 Enlarged prostate without lower urinary tract symptoms: Secondary | ICD-10-CM | POA: Diagnosis present

## 2024-10-02 DIAGNOSIS — N184 Chronic kidney disease, stage 4 (severe): Secondary | ICD-10-CM | POA: Diagnosis present

## 2024-10-02 DIAGNOSIS — E1121 Type 2 diabetes mellitus with diabetic nephropathy: Secondary | ICD-10-CM | POA: Diagnosis present

## 2024-10-02 LAB — CBG MONITORING, ED
Glucose-Capillary: 95 mg/dL (ref 70–99)
Glucose-Capillary: 164 mg/dL — ABNORMAL HIGH (ref 70–99)

## 2024-10-02 LAB — TROPONIN T, HIGH SENSITIVITY
Troponin T High Sensitivity: 92 ng/L — ABNORMAL HIGH (ref 0–19)
Troponin T High Sensitivity: 101 ng/L (ref 0–19)
Troponin T High Sensitivity: 96 ng/L — ABNORMAL HIGH (ref 0–19)
Troponin T High Sensitivity: 93 ng/L — ABNORMAL HIGH (ref 0–19)

## 2024-10-02 LAB — CBC
HCT: 45.2 % (ref 39.0–52.0)
Hemoglobin: 14.3 g/dL (ref 13.0–17.0)
MCH: 30.2 pg (ref 26.0–34.0)
MCHC: 31.6 g/dL (ref 30.0–36.0)
MCV: 95.4 fL (ref 80.0–100.0)
Platelets: 122 K/uL — ABNORMAL LOW (ref 150–400)
RBC: 4.74 MIL/uL (ref 4.22–5.81)
RDW: 17.1 % — ABNORMAL HIGH (ref 11.5–15.5)
WBC: 7.4 K/uL (ref 4.0–10.5)
nRBC: 0 % (ref 0.0–0.2)

## 2024-10-02 LAB — BASIC METABOLIC PANEL WITH GFR
Anion gap: 11 (ref 5–15)
BUN: 40 mg/dL — ABNORMAL HIGH (ref 8–23)
CO2: 23 mmol/L (ref 22–32)
Calcium: 10 mg/dL (ref 8.9–10.3)
Chloride: 107 mmol/L (ref 98–111)
Creatinine, Ser: 2.21 mg/dL — ABNORMAL HIGH (ref 0.61–1.24)
GFR, Estimated: 28 mL/min — ABNORMAL LOW (ref >60)
Glucose, Bld: 113 mg/dL — ABNORMAL HIGH (ref 70–99)
Potassium: 4.9 mmol/L (ref 3.5–5.1)
Sodium: 141 mmol/L (ref 135–145)

## 2024-10-02 LAB — LACTIC ACID, PLASMA: Lactic Acid, Venous: 1.8 mmol/L (ref 0.5–1.9)

## 2024-10-02 LAB — MAGNESIUM: Magnesium: 2.6 mg/dL — ABNORMAL HIGH (ref 1.7–2.4)

## 2024-10-02 LAB — PRO BRAIN NATRIURETIC PEPTIDE: Pro Brain Natriuretic Peptide: 13650 pg/mL — ABNORMAL HIGH (ref <300.0)

## 2024-10-02 MED ORDER — INSULIN ASPART 100 UNIT/ML IJ SOLN
0.0000 [IU] | Freq: Three times a day (TID) | INTRAMUSCULAR | Status: DC
  Administered 2024-10-04: 2 [IU] via SUBCUTANEOUS
  Administered 2024-10-05: 3 [IU] via SUBCUTANEOUS
  Filled 2024-10-02: qty 5
  Filled 2024-10-02: qty 2
  Filled 2024-10-02 (×2): qty 3

## 2024-10-02 MED ORDER — APIXABAN 2.5 MG PO TABS
2.5000 mg | ORAL_TABLET | Freq: Two times a day (BID) | ORAL | Status: DC
  Administered 2024-10-02 – 2024-10-06 (×8): 2.5 mg via ORAL
  Filled 2024-10-02 (×8): qty 1

## 2024-10-02 MED ORDER — ONDANSETRON HCL 4 MG PO TABS: 4.0000 mg | ORAL_TABLET | Freq: Four times a day (QID) | ORAL | Status: DC | PRN

## 2024-10-02 MED ORDER — LABETALOL HCL 5 MG/ML IV SOLN
20.0000 mg | Freq: Once | INTRAVENOUS | Status: AC
  Administered 2024-10-02: 20 mg via INTRAVENOUS
  Filled 2024-10-02: qty 4

## 2024-10-02 MED ORDER — ONDANSETRON HCL 4 MG/2ML IJ SOLN: 4.0000 mg | Freq: Four times a day (QID) | INTRAMUSCULAR | Status: DC | PRN

## 2024-10-02 MED ORDER — SENNOSIDES-DOCUSATE SODIUM 8.6-50 MG PO TABS: 1.0000 | ORAL_TABLET | Freq: Every evening | ORAL | Status: DC | PRN

## 2024-10-02 MED ORDER — CARVEDILOL 6.25 MG PO TABS
12.5000 mg | ORAL_TABLET | Freq: Two times a day (BID) | ORAL | Status: DC
  Administered 2024-10-02 – 2024-10-06 (×8): 12.5 mg via ORAL
  Filled 2024-10-02: qty 1
  Filled 2024-10-02: qty 2
  Filled 2024-10-02 (×4): qty 1
  Filled 2024-10-02: qty 2
  Filled 2024-10-02: qty 1

## 2024-10-02 MED ORDER — INSULIN ASPART 100 UNIT/ML IJ SOLN: 0.0000 [IU] | Freq: Every day | INTRAMUSCULAR | Status: DC

## 2024-10-02 MED ORDER — FINASTERIDE 5 MG PO TABS
5.0000 mg | ORAL_TABLET | Freq: Every day | ORAL | Status: DC
  Administered 2024-10-03 – 2024-10-06 (×4): 5 mg via ORAL
  Filled 2024-10-02 (×4): qty 1

## 2024-10-02 MED ORDER — ALLOPURINOL 100 MG PO TABS
50.0000 mg | ORAL_TABLET | Freq: Every day | ORAL | Status: DC
  Administered 2024-10-03 – 2024-10-06 (×4): 50 mg via ORAL
  Filled 2024-10-02: qty 0.5
  Filled 2024-10-02 (×3): qty 1

## 2024-10-02 MED ORDER — ROSUVASTATIN CALCIUM 20 MG PO TABS
20.0000 mg | ORAL_TABLET | Freq: Every day | ORAL | Status: DC
  Administered 2024-10-02 – 2024-10-06 (×5): 20 mg via ORAL
  Filled 2024-10-02: qty 1
  Filled 2024-10-02 (×2): qty 2
  Filled 2024-10-02: qty 1
  Filled 2024-10-02: qty 2

## 2024-10-02 MED ORDER — CLONIDINE HCL 0.2 MG/24HR TD PTWK
0.2000 mg | MEDICATED_PATCH | TRANSDERMAL | Status: DC
  Administered 2024-10-02: 0.2 mg via TRANSDERMAL
  Filled 2024-10-02: qty 1

## 2024-10-02 MED ORDER — LORAZEPAM 1 MG PO TABS
1.0000 mg | ORAL_TABLET | Freq: Once | ORAL | Status: AC | PRN
  Administered 2024-10-02: 1 mg via ORAL
  Filled 2024-10-02: qty 1

## 2024-10-02 MED ORDER — FUROSEMIDE 10 MG/ML IJ SOLN
40.0000 mg | Freq: Two times a day (BID) | INTRAMUSCULAR | Status: AC
  Administered 2024-10-02 – 2024-10-03 (×3): 40 mg via INTRAVENOUS
  Filled 2024-10-02 (×3): qty 4

## 2024-10-02 MED ORDER — ACETAMINOPHEN 650 MG RE SUPP: 650.0000 mg | Freq: Four times a day (QID) | RECTAL | Status: DC | PRN

## 2024-10-02 MED ORDER — ACETAMINOPHEN 325 MG PO TABS: 650.0000 mg | ORAL_TABLET | Freq: Four times a day (QID) | ORAL | Status: DC | PRN

## 2024-10-02 MED ORDER — CARVEDILOL 6.25 MG PO TABS: 6.2500 mg | ORAL_TABLET | Freq: Two times a day (BID) | ORAL | Status: DC

## 2024-10-02 MED ORDER — FUROSEMIDE 10 MG/ML IJ SOLN
40.0000 mg | Freq: Once | INTRAMUSCULAR | Status: AC
  Administered 2024-10-02: 40 mg via INTRAVENOUS
  Filled 2024-10-02: qty 4

## 2024-10-02 MED ORDER — EMPAGLIFLOZIN 10 MG PO TABS
10.0000 mg | ORAL_TABLET | Freq: Every day | ORAL | Status: DC
  Administered 2024-10-03: 10 mg via ORAL
  Filled 2024-10-02: qty 1

## 2024-10-02 MED ORDER — SODIUM CHLORIDE 0.9% FLUSH
3.0000 mL | Freq: Two times a day (BID) | INTRAVENOUS | Status: DC
  Administered 2024-10-02 – 2024-10-05 (×7): 3 mL via INTRAVENOUS

## 2024-10-02 MED ORDER — HYDRALAZINE HCL 20 MG/ML IJ SOLN
5.0000 mg | Freq: Once | INTRAMUSCULAR | Status: AC
  Administered 2024-10-02: 5 mg via INTRAVENOUS
  Filled 2024-10-02: qty 1

## 2024-10-02 MED ORDER — HYDRALAZINE HCL 20 MG/ML IJ SOLN
10.0000 mg | Freq: Once | INTRAMUSCULAR | Status: AC
  Administered 2024-10-02: 10 mg via INTRAVENOUS
  Filled 2024-10-02: qty 1

## 2024-10-02 NOTE — ED Triage Notes (Signed)
 Pt comes via EMS from [facility] system. Pt here for HTN. Pt on meds for this but might not have taken it today. Pt also having increased sob, cp and AMS  BP 186/123  Hx of dementia.

## 2024-10-02 NOTE — ED Notes (Signed)
 Pt remains on 4 liter's [place]. Pt moved to hospital bed, cleaned of urine and placed in hospital gown.

## 2024-10-02 NOTE — H&P (Addendum)
 History and Physical    George Bolton FMW:[id number] DOB: 08/25/1941 DOA: 10/02/2024  DOS: the patient was seen and examined on 10/02/2024  PCP: [employer]   Patient coming from: Home  I have personally briefly reviewed patient's old medical records in [facility] and [facility]  HPI:   George Bolton is a 83 y.o. year old male with medical history of hypertension, hyperlipidemia, type 2 diabetes, atrial fibrillation on Eliquis , HFrEF (EF 45% in 04/2024) presented to the ED with shortness of breath, chest pain and some confusion.   Pt unable to provide any history as he is very somnolent on my evaluation. Unfortunately he is status post ativan  in the ED given agitation. I tried to call spouse on file but no answer. I was able to speak with son, who state spt uses wheel chair for mobility. [facility]. He is intermittently using 2.5 L of oxygen but has required it more. He denies any other complaints but states it appeared his fathers breathing had worsened over the last day. He reports his father weight is steady around 164-165lbs. He was recently told to increase his torsemide  to 20 mg daily which he did.   [facility] it appears pt is here after having shortness of breath and more confusion along with significantly elevated blood pressures.    On arrival to the ED patient was noted to be HDS stable.  Lab work and imaging obtained.  CBC without leukocytosis, baseline hemoglobin which is normal.  Stable thrombocytopenia.  BMP with baseline renal function consistent with CKD 4, proBNP significantly elevated at 13.6 K, initial troponin elevated at 92 with repeat pending.  Chest x-ray shows pulmonary edema with low lung volumes with concern for bilateral pleural effusions and opacities versus atelectasis.  Given this presentation, TRH contacted for admission.  Review of Systems: As mentioned in the history of present  illness. All other systems reviewed and are negative.   Past Medical History:  Diagnosis Date   Acute embolic stroke (HCC) 11/26/2021   Chronic atrial fibrillation (HCC)    a. Afib dates back to at least 2002 when reviewing prior EKG with EKGs from 2014 onward showing Afib; b. CHADS2VASc at least 4 (CHF, HTN, age x 2)   Chronic diastolic CHF (congestive heart failure) (HCC)    a. TTE 5/19: EF 70-75%, mod LVH, near complete obliteration of the mid and apical LV cavity (can be seen in apical hypertrophic CM), mildly dilated LA, RV cavity size nl, RV wall thickness nl, RVSF mildly reduced, mildly dilated RA; b. 02/2018 Limited echo w/ definity : EF 50-65%, no apical hypertrophic CM.   Community acquired bilateral lower lobe pneumonia 11/23/2021   Goals of care, counseling/discussion 12/11/2021   Gout    Hypertension    Hypertensive emergency 12/10/2021   Hypertensive urgency 07/16/2022    Past Surgical History:  Procedure Laterality Date   CHOLECYSTECTOMY N/A 01/13/2017   Procedure: LAPAROSCOPIC CHOLECYSTECTOMY;  Surgeon: Laneta JULIANNA Luna, MD;  Location: [facility] ORS;  Service: General;  Laterality: N/A;     Allergies  Allergen Reactions   Cortisone Anaphylaxis   Triamcinolone     Other reaction(s): Unknown    Family History  Problem Relation Age of Onset   Stroke Mother     Prior to Admission medications   Medication Sig Start Date End Date Taking? Authorizing Provider  acetaminophen  (TYLENOL ) 650 MG CR tablet Take 1,300 mg by mouth 2 (two)  times daily.    [provider]  allopurinol  (ZYLOPRIM ) 100 MG tablet Take 1 tablet (100 mg total) by mouth daily. Home med. 09/20/22   Awanda City, MD  apixaban  (ELIQUIS ) 2.5 MG TABS tablet Take 2.5 mg by mouth 2 (two) times daily. 12/27/19   [provider]  bisacodyl  (DULCOLAX) 5 MG EC tablet Take 2 tablets (10 mg total) by mouth at bedtime. Skip the dose if no constipation 06/16/23   Von Bellis, MD  carvedilol  (COREG ) 6.25 MG  tablet Take 6.25 mg by mouth 2 (two) times daily. Take along with one 12.5 mg tablet for total 18.75 mg twice daily 05/12/24   [provider]  cloNIDine  (CATAPRES  - DOSED IN MG/24 HR) 0.3 mg/24hr patch Place 0.3 mg onto the skin once a week. 05/12/24   [provider]  empagliflozin  (JARDIANCE ) 10 MG TABS tablet Take 10 mg by mouth daily.    [provider]  fexofenadine (ALLEGRA) 180 MG tablet Take 180 mg by mouth daily. Patient not taking: Reported on 09/20/2024    [provider]  finasteride  (PROSCAR ) 5 MG tablet Take 1 tablet (5 mg total) by mouth daily. 05/29/22   Vaillancourt, Samantha, PA-C  isosorbide  mononitrate (IMDUR ) 30 MG 24 hr tablet Take 1 tablet (30 mg total) by mouth daily. Hold if SBP <130 mmHg 06/15/23   Von Bellis, MD  losartan  (COZAAR ) 50 MG tablet Take 1 tablet (50 mg total) by mouth daily. 05/19/24   Amin, Sumayya, MD  mirtazapine  (REMERON ) 7.5 MG tablet Take 7.5 mg by mouth at bedtime. Patient not taking: Reported on 09/20/2024    [provider]  nystatin  (MYCOSTATIN /NYSTOP ) powder Apply 1 Application topically 2 (two) times daily. 05/22/22   [provider]  polyethylene glycol (MIRALAX  / GLYCOLAX ) 17 g packet Take 17 g by mouth 2 (two) times daily. Skip the dose if no constipation 06/15/23   Von Bellis, MD  rosuvastatin  (CRESTOR ) 20 MG tablet Take 1 tablet (20 mg total) by mouth daily. 11/28/21   Josette Ade, MD  tamsulosin  (FLOMAX ) 0.4 MG CAPS capsule Take 1 capsule (0.4 mg total) by mouth daily after supper. 05/19/24   Amin, Sumayya, MD  torsemide  (DEMADEX ) 20 MG tablet Take 1 tablet (20 mg total) by mouth daily. 05/19/24   Caleen Qualia, MD    Social History:  reports that he has quit smoking. His smoking use included cigarettes. He has never used smokeless tobacco. He reports that he does not drink alcohol and does not use drugs.    Physical Exam: Vitals:   10/02/24 1158 10/02/24 1335 10/02/24 1400 10/02/24  1430  BP: (!) 183/142 (!) 181/134 (!) 181/134 (!) 178/102  Pulse: 89 65 82 (!) 102  Resp: 19     Temp: 98.3 F (36.8 C)     TempSrc: Oral     SpO2: 97% 91% 96% 100%  Weight: 77.1 kg     Height: 6' 1 (1.854 m)       Gen: NAD HENT: NCAT, [place] in place CV: normal heart sounds, 3+ LE edema, JVD present Lung: bibasilar rales.  Abd: No TTP, normal bowel sounds MSK: No asymmetry, good bulk and tone Neuro: somnolent but opens eyes to to repeated commands. Moving all extremities with multiple prompts.    Labs on Admission: I have personally reviewed following labs and imaging studies  CBC: Recent Labs  Lab 10/02/24 1159  WBC 7.4  HGB 14.3  HCT 45.2  MCV 95.4  PLT 122*  Basic Metabolic Panel: Recent Labs  Lab 10/02/24 1159  NA 141  K 4.9  CL 107  CO2 23  GLUCOSE 113*  BUN 40*  CREATININE 2.21*  CALCIUM  10.0   GFR: Estimated Creatinine Clearance: 26.6 mL/min (A) (by C-G formula based on SCr of 2.21 mg/dL (H)). Liver Function Tests: No results for input(s): AST, ALT, ALKPHOS, BILITOT, PROT, ALBUMIN in the last 168 hours. No results for input(s): LIPASE, AMYLASE in the last 168 hours. No results for input(s): AMMONIA in the last 168 hours. Coagulation Profile: No results for input(s): INR, PROTIME in the last 168 hours. Cardiac Enzymes: No results for input(s): CKTOTAL, CKMB, CKMBINDEX, TROPONINI, TROPONINIHS in the last 168 hours. BNP (last 3 results) Recent Labs    05/15/24 0030  BNP 856.3*   HbA1C: No results for input(s): HGBA1C in the last 72 hours. CBG: No results for input(s): GLUCAP in the last 168 hours. Lipid Profile: No results for input(s): CHOL, HDL, LDLCALC, TRIG, CHOLHDL, LDLDIRECT in the last 72 hours. Thyroid Function Tests: No results for input(s): TSH, T4TOTAL, FREET4, T3FREE, THYROIDAB in the last 72 hours. Anemia Panel: No results for input(s): VITAMINB12, FOLATE, FERRITIN,  TIBC, IRON, RETICCTPCT in the last 72 hours. Urine analysis:    Component Value Date/Time   COLORURINE YELLOW (A) 04/25/2023 2126   APPEARANCEUR CLEAR (A) 04/25/2023 2126   APPEARANCEUR Clear 07/21/2018 0959   LABSPEC 1.011 04/25/2023 2126   LABSPEC 1.014 09/23/2012 1438   PHURINE 6.0 04/25/2023 2126   GLUCOSEU NEGATIVE 04/25/2023 2126   GLUCOSEU 50 mg/dL 88/70/7986 8561   HGBUR SMALL (A) 04/25/2023 2126   BILIRUBINUR NEGATIVE 04/25/2023 2126   BILIRUBINUR Negative 07/21/2018 0959   BILIRUBINUR Negative 09/23/2012 1438   KETONESUR NEGATIVE 04/25/2023 2126   PROTEINUR 100 (A) 04/25/2023 2126   NITRITE NEGATIVE 04/25/2023 2126   LEUKOCYTESUR LARGE (A) 04/25/2023 2126   LEUKOCYTESUR Negative 09/23/2012 1438    Radiological Exams on Admission: I have personally reviewed images DG Chest 2 View Result Date: 10/02/2024 EXAM: 2 VIEW(S) XRAY OF THE CHEST 10/02/2024 12:29:25 PM COMPARISON: 05/15/2024 CLINICAL HISTORY: Chest pain. FINDINGS: LUNGS AND PLEURA: Low lung volumes accentuate cardiomediastinal silhouette and pulmonary vascularity. Bibasilar airspace opacities, left greater than right. Small bilateral pleural effusions. No pneumothorax. HEART AND MEDIASTINUM: No acute abnormality of the cardiac and mediastinal silhouettes. BONES AND SOFT TISSUES: Upper chest obscured by the patient head and facial tissues due to kyphosis and limited mobility. Thoracic spondylosis. No acute osseous abnormality. IMPRESSION: 1. Bibasilar airspace opacities, left greater than right. 2. Small bilateral pleural effusions. 3. Low lung volumes with accentuation of the cardiomediastinal silhouette and pulmonary vascularity; upper chest partially obscured by patient head and facial tissues due to kyphosis and limited mobility. 4. Thoracic spondylosis. Electronically signed by: Ryan Salvage MD 10/02/2024 01:01 PM EST RP Workstation: [id number]    EKG: My personal interpretation of EKG shows: Atrial  fibrillation without any acute ST changes with heart rate in the 90s.    Assessment/Plan Principal Problem:   Acute hypoxic respiratory failure (HCC) Active Problems:   Atrial fibrillation, chronic (HCC)   Type 2 diabetes mellitus with diabetic nephropathy (HCC)   Essential hypertension   Elevated troponin   CKD (chronic kidney disease), stage IV (HCC)   BPH (benign prostatic hyperplasia)   Patient with acute hypoxic respiratory failure likely secondary to CHF exacerbation given exam, imaging and elevated BNP and increase in his weight. Blood pressure likely elevated secondary to this.  Patient given 1 dose of IV  Lasix .  Will continue this.  Will monitor strict input and output and daily weights.  Will monitor his creatinine as well.  Try to wean his oxygen as able. Would like to switch him to oral torsemide  one day prior to discharge to find optimal dose for him to minimize recurrent episode.   Atrial fibrillation: Chronic.  Not in RVR.  Continue to monitor.  Continue his Eliquis .  Hypertension: Resume his home antihypertensives and diuresis as mentioned above.  Continues as needed IV antihypertensives.  His home regimen is Coreg , clonidine .   Type 2 diabetes: On Jardiance  outpatient.  Will continue here.  Will place him on sensitive SSI  CKDIV: Creatinine at baseline.  Will monitor while diuresing.  Avoid nephrotoxic agents, renally dose medications.  Check BMP daily.  BPH: Will continue his finasteride .  Hyperlipidemia: Will continue his statin  Gout: Continue home allopurinol .  Will decrease to 50 mg based on his renal function.  This dose should be continued at discharge  Urinalysis: per son's request. He states pt had UTI without any symptoms previously. Discussed asymptomatic bacteriuria and risk of unneeded abx. He still wanted to pursue this.   VTE prophylaxis:  Eliquis   Diet: NPO Code Status:  DNR/DNI(Do NOT Intubate) Telemetry:  Admission status: Observation,  Telemetry bed Patient is from: Home Anticipated d/c is to: Home Anticipated d/c is in: 1-2 days   Family Communication: Tried to call wife but no answer.   Consults called: None   Severity of Illness: The appropriate patient status for this patient is OBSERVATION. Observation status is judged to be reasonable and necessary in order to provide the required intensity of service to ensure the patient's safety. The patient's presenting symptoms, physical exam findings, and initial radiographic and laboratory data in the context of their medical condition is felt to place them at decreased risk for further clinical deterioration. Furthermore, it is anticipated that the patient will be medically stable for discharge from the hospital within 2 midnights of admission.    Morene Bathe, MD [facility]

## 2024-10-02 NOTE — ED Provider Notes (Signed)
 [facility] Provider Note    Event Date/Time   First MD Initiated Contact with Patient 10/02/24 1328     (approximate)   History   Hypertension and Chest Pain   HPI  George Bolton is a 83 y.o. male with history of dementia, CHF, chronic atrial fibrillation, hypertension sent in by his PCP for shortness of breath, chest pain, hypertension and possibly some altered mental status.  Patient apparently takes torsemide , [facility].  Apparently hypoxic upon arrival, 3 L nasal cannula started     Physical Exam   Triage Vital Signs: ED Triage Vitals  Encounter Vitals Group     BP 10/02/24 1158 (!) 183/142     Girls Systolic BP Percentile --      Girls Diastolic BP Percentile --      Boys Systolic BP Percentile --      Boys Diastolic BP Percentile --      Pulse Rate 10/02/24 1158 89     Resp 10/02/24 1158 19     Temp 10/02/24 1158 98.3 F (36.8 C)     Temp Source 10/02/24 1158 Oral     SpO2 10/02/24 1158 97 %     Weight 10/02/24 1158 77.1 kg (170 lb)     Height 10/02/24 1158 1.854 m (6' 1)     Head Circumference --      Peak Flow --      Pain Score 10/02/24 1141 4     Pain Loc --      Pain Education --      Exclude from Growth Chart --     Most recent vital signs: Vitals:   10/02/24 1400 10/02/24 1430  BP: (!) 181/134 (!) 178/102  Pulse: 82 (!) 102  Resp:    Temp:    SpO2: 96% 100%     General: Awake,  CV:  Good peripheral perfusion.  Irregular rhythm, normal rate Resp:  Normal effort.  Bibasilar Rales Abd:  No distention.  Other:  Mild lower extremity edema   ED Results / Procedures / Treatments   Labs (all labs ordered are listed, but only abnormal results are displayed) Labs Reviewed  BASIC METABOLIC PANEL WITH GFR - Abnormal; Notable for the following components:      Result Value   Glucose, Bld 113 (*)    BUN 40 (*)    Creatinine, Ser 2.21 (*)    GFR, Estimated 28 (*)    All other components  within normal limits  CBC - Abnormal; Notable for the following components:   RDW 17.1 (*)    Platelets 122 (*)    All other components within normal limits  PRO BRAIN NATRIURETIC PEPTIDE - Abnormal; Notable for the following components:   Pro Brain Natriuretic Peptide 13,650.0 (*)    All other components within normal limits  TROPONIN T, HIGH SENSITIVITY - Abnormal; Notable for the following components:   Troponin T High Sensitivity 92 (*)    All other components within normal limits  TROPONIN T, HIGH SENSITIVITY     EKG  ED ECG REPORT I, Lamar Price, the attending physician, personally viewed and interpreted this ECG.  Date: 10/02/2024  Rhythm: Atrial fibrillation QRS Axis: normal Intervals: Abnormal ST/T Wave abnormalities: Nonspecific changes Narrative Interpretation: Nonspecific changes    RADIOLOGY Chest x-ray with bilateral airspace opacities suspicious for pulmonary edema    PROCEDURES:  Critical Care performed: yes  CRITICAL CARE Performed by: Lamar Price  Total critical care time: 30 minutes  Critical care time was exclusive of separately billable procedures and treating other patients.  Critical care was necessary to treat or prevent imminent or life-threatening deterioration.  Critical care was time spent personally by me on the following activities: development of treatment plan with patient and/or surrogate as well as nursing, discussions with consultants, evaluation of patient's response to treatment, examination of patient, obtaining history from patient or surrogate, ordering and performing treatments and interventions, ordering and review of laboratory studies, ordering and review of radiographic studies, pulse oximetry and re-evaluation of patient's condition.   Procedures   MEDICATIONS ORDERED IN ED: Medications  furosemide  (LASIX ) injection 40 mg (40 mg Intravenous Given 10/02/24 1414)  labetalol  (NORMODYNE ) injection 20 mg (20 mg  Intravenous Given 10/02/24 1414)  LORazepam  (ATIVAN ) tablet 1 mg (1 mg Oral Given 10/02/24 1414)     IMPRESSION / MDM / ASSESSMENT AND PLAN / ED COURSE  I reviewed the triage vital signs and the nursing notes. Patient's presentation is most consistent with severe exacerbation of chronic illness.  Patient presents with significant hypertension, shortness of breath and chest discomfort as detailed above.  Differential includes CHF exacerbation, ACS, pulmonary edema  He is markedly hypertensive here we will give a dose of IV labetalol , notably his chest x-ray is suspicious for likely pulmonary edema, he has a mildly elevated troponin and a significantly elevated BNP consistent with CHF exacerbation, will give IV Lasix   He has chronic kidney disease.  He is on 3 L nasal cannula, he does use oxygen at at home as needed but it is rare  Will consult the hospitalist team for admission        FINAL CLINICAL IMPRESSION(S) / ED DIAGNOSES   Final diagnoses:  Acute pulmonary edema (HCC)     Rx / DC Orders   ED Discharge Orders     None        Note:  This document was prepared using Dragon voice recognition software and may include unintentional dictation errors.   Arlander Charleston, MD 10/02/24 [phone]

## 2024-10-03 DIAGNOSIS — Z8673 Personal history of transient ischemic attack (TIA), and cerebral infarction without residual deficits: Secondary | ICD-10-CM | POA: Diagnosis not present

## 2024-10-03 DIAGNOSIS — E1122 Type 2 diabetes mellitus with diabetic chronic kidney disease: Secondary | ICD-10-CM | POA: Diagnosis present

## 2024-10-03 DIAGNOSIS — I482 Chronic atrial fibrillation, unspecified: Secondary | ICD-10-CM | POA: Diagnosis present

## 2024-10-03 DIAGNOSIS — Z9049 Acquired absence of other specified parts of digestive tract: Secondary | ICD-10-CM | POA: Diagnosis not present

## 2024-10-03 DIAGNOSIS — J9601 Acute respiratory failure with hypoxia: Secondary | ICD-10-CM | POA: Diagnosis present

## 2024-10-03 DIAGNOSIS — Z66 Do not resuscitate: Secondary | ICD-10-CM | POA: Diagnosis present

## 2024-10-03 DIAGNOSIS — M109 Gout, unspecified: Secondary | ICD-10-CM | POA: Diagnosis present

## 2024-10-03 DIAGNOSIS — Z87891 Personal history of nicotine dependence: Secondary | ICD-10-CM | POA: Diagnosis not present

## 2024-10-03 DIAGNOSIS — Z7984 Long term (current) use of oral hypoglycemic drugs: Secondary | ICD-10-CM | POA: Diagnosis not present

## 2024-10-03 DIAGNOSIS — I130 Hypertensive heart and chronic kidney disease with heart failure and stage 1 through stage 4 chronic kidney disease, or unspecified chronic kidney disease: Secondary | ICD-10-CM | POA: Diagnosis present

## 2024-10-03 DIAGNOSIS — Z7901 Long term (current) use of anticoagulants: Secondary | ICD-10-CM | POA: Diagnosis not present

## 2024-10-03 DIAGNOSIS — N184 Chronic kidney disease, stage 4 (severe): Secondary | ICD-10-CM | POA: Diagnosis present

## 2024-10-03 DIAGNOSIS — N400 Enlarged prostate without lower urinary tract symptoms: Secondary | ICD-10-CM | POA: Diagnosis present

## 2024-10-03 DIAGNOSIS — R079 Chest pain, unspecified: Secondary | ICD-10-CM | POA: Diagnosis present

## 2024-10-03 DIAGNOSIS — Z823 Family history of stroke: Secondary | ICD-10-CM | POA: Diagnosis not present

## 2024-10-03 DIAGNOSIS — F0150 Vascular dementia without behavioral disturbance: Secondary | ICD-10-CM | POA: Diagnosis present

## 2024-10-03 DIAGNOSIS — D696 Thrombocytopenia, unspecified: Secondary | ICD-10-CM | POA: Diagnosis present

## 2024-10-03 DIAGNOSIS — E785 Hyperlipidemia, unspecified: Secondary | ICD-10-CM | POA: Diagnosis present

## 2024-10-03 DIAGNOSIS — I5033 Acute on chronic diastolic (congestive) heart failure: Secondary | ICD-10-CM | POA: Diagnosis present

## 2024-10-03 LAB — URINALYSIS, W/ REFLEX TO CULTURE (INFECTION SUSPECTED)
Bilirubin Urine: NEGATIVE
Glucose, UA: >=500 mg/dL — AB
Ketones, ur: NEGATIVE mg/dL
Nitrite: NEGATIVE
Protein, ur: 100 mg/dL — AB
RBC / HPF: >50 RBC/hpf (ref 0–5)
Specific Gravity, Urine: 1.01 (ref 1.005–1.030)
Squamous Epithelial / HPF: 0 /HPF (ref 0–5)
pH: 7 (ref 5.0–8.0)

## 2024-10-03 LAB — BASIC METABOLIC PANEL WITH GFR
Anion gap: 14 (ref 5–15)
BUN: 40 mg/dL — ABNORMAL HIGH (ref 8–23)
CO2: 19 mmol/L — ABNORMAL LOW (ref 22–32)
Calcium: 9.9 mg/dL (ref 8.9–10.3)
Chloride: 109 mmol/L (ref 98–111)
Creatinine, Ser: 2.02 mg/dL — ABNORMAL HIGH (ref 0.61–1.24)
GFR, Estimated: 32 mL/min — ABNORMAL LOW (ref >60)
Glucose, Bld: 117 mg/dL — ABNORMAL HIGH (ref 70–99)
Potassium: 4 mmol/L (ref 3.5–5.1)
Sodium: 143 mmol/L (ref 135–145)

## 2024-10-03 LAB — CBC
HCT: 45 % (ref 39.0–52.0)
Hemoglobin: 14.4 g/dL (ref 13.0–17.0)
MCH: 30.4 pg (ref 26.0–34.0)
MCHC: 32 g/dL (ref 30.0–36.0)
MCV: 95.1 fL (ref 80.0–100.0)
Platelets: 133 K/uL — ABNORMAL LOW (ref 150–400)
RBC: 4.73 MIL/uL (ref 4.22–5.81)
RDW: 17.3 % — ABNORMAL HIGH (ref 11.5–15.5)
WBC: 6.5 K/uL (ref 4.0–10.5)
nRBC: 0 % (ref 0.0–0.2)

## 2024-10-03 LAB — CBG MONITORING, ED: Glucose-Capillary: 113 mg/dL — ABNORMAL HIGH (ref 70–99)

## 2024-10-03 LAB — GLUCOSE, CAPILLARY
Glucose-Capillary: 125 mg/dL — ABNORMAL HIGH (ref 70–99)
Glucose-Capillary: 125 mg/dL — ABNORMAL HIGH (ref 70–99)

## 2024-10-03 MED ORDER — FUROSEMIDE 10 MG/ML IJ SOLN: 40.0000 mg | Freq: Every day | INTRAMUSCULAR | Status: DC

## 2024-10-03 NOTE — Evaluation (Addendum)
 Physical Therapy Evaluation Patient Details Name: George Bolton MRN: [id number] DOB: 1941/04/03 Today's Date: 10/03/2024  History of Present Illness  Pt is an 83 y/o M admitted on 10/02/24 after presenting with c/o SOB, chest pain, confusion. Pt is being treated for acute hypoxic respiratory failure likely 2/2 CHF exacerbation. PMH: HTN, HLD, DM2, a-fib on Eliquis , HFrEF, stroke, gout  Clinical Impression  Pt seen for PT evaluation with pt upset over no food/drink despite tray table beside of him. Pt transitioned to sitting EOB with max assist, rolling L<>R with mod<>max assist. Pt requires CGA<>min assist for static sitting EOB 2/2 L lateral lean. Pt set up with meal tray, self feeding at end of session. Recommend ongoing PT services to progress mobility/transfers with sit<>stand lift.  SpO2 >/= 90% on supplemental O2 throughout session.      If plan is discharge home, recommend the following: A lot of help with walking and/or transfers;A lot of help with bathing/dressing/bathroom;Assistance with feeding;Direct supervision/assist for medications management;Assistance with cooking/housework;Direct supervision/assist for financial management;Supervision due to cognitive status   Can travel by private vehicle        Equipment Recommendations None recommended by PT  Recommendations for Other Services       Functional Status Assessment Patient has had a recent decline in their functional status and demonstrates the ability to make significant improvements in function in a reasonable and predictable amount of time.     Precautions / Restrictions Precautions Precautions: Fall Restrictions Weight Bearing Restrictions Per Provider Order: No      Mobility  Bed Mobility Overal bed mobility: Needs Assistance Bed Mobility: Supine to Sit, Sit to Supine, Rolling Rolling: Mod assist, Max assist, Used rails   Supine to sit: Max assist, HOB elevated, Used rails (exit L side of bed) Sit to  supine: Mod assist   General bed mobility comments: max assist to scoot to [facility] with bed in trendelenburg position    Transfers                        Ambulation/Gait                  Stairs            Wheelchair Mobility     Tilt Bed    Modified Rankin (Stroke Patients Only)       Balance Overall balance assessment: Needs assistance Sitting-balance support: Feet supported Sitting balance-Leahy Scale: Poor Sitting balance - Comments: CGA<>min assist static sitting, assistance to correct L lateral lean                                     Pertinent Vitals/Pain Pain Assessment Pain Assessment: No/denies pain    Home Living Family/patient expects to be discharged to:: Private residence Living Arrangements: Spouse/significant other;Children Available Help at Discharge: Family Type of Home: House Home Access: Ramped entrance       Home Layout: One level Home Equipment: Wheelchair - manual;Lift chair;BSC/3in1;Hospital bed;Other (comment) (pure wick) Additional Comments: Personal care aide 2 hours in AM & 2 hours in PM (bathing, dressing, toileting, washing up) 5 days/week.    Prior Function               Mobility Comments: uses sit>stand lift for transfers, does not propel his own w/c ADLs Comments: assistance for bathing, dressing, self care, meal set up     Extremity/Trunk  Assessment   Upper Extremity Assessment Upper Extremity Assessment: Generalized weakness (nodules on fingers of B hands, son reports this is 2/2 gout crystal build up)    Lower Extremity Assessment Lower Extremity Assessment: Generalized weakness    Cervical / Trunk Assessment Cervical / Trunk Assessment: Kyphotic  Communication   Communication Communication: Impaired Factors Affecting Communication: Reduced clarity of speech    Cognition Arousal: Alert     PT - Cognitive impairments: Difficult to assess Difficult to assess due to:  Impaired communication                     PT - Cognition Comments: Pt received in bed, groaning/crying 2/2 not having food or drink despite tray beside him. PT provided assistance for meal tray setup. Following commands: Impaired Following commands impaired: Follows one step commands with increased time     Cueing Cueing Techniques: Verbal cues, Gestural cues     General Comments      Exercises     Assessment/Plan    PT Assessment Patient needs continued PT services  PT Problem List Decreased strength;Cardiopulmonary status limiting activity;Decreased range of motion;Decreased activity tolerance;Decreased cognition;Decreased knowledge of use of DME;Decreased balance;Decreased safety awareness;Decreased mobility;Decreased knowledge of precautions;Decreased skin integrity       PT Treatment Interventions DME instruction;Balance training;Gait training;Neuromuscular re-education;Functional mobility training;Therapeutic activities;Therapeutic exercise;Patient/family education    PT Goals (Current goals can be found in the Care Plan section)  Acute Rehab PT Goals Patient Stated Goal: none stated PT Goal Formulation: With patient/family Time For Goal Achievement: 10/17/24 Potential to Achieve Goals: Fair    Frequency Min 2X/week     Co-evaluation               AM-PAC PT 6 Clicks Mobility  Outcome Measure Help needed turning from your back to your side while in a flat bed without using bedrails?: A Lot Help needed moving from lying on your back to sitting on the side of a flat bed without using bedrails?: Total Help needed moving to and from a bed to a chair (including a wheelchair)?: Total Help needed standing up from a chair using your arms (e.g., wheelchair or bedside chair)?: Total Help needed to walk in hospital room?: Total Help needed climbing 3-5 steps with a railing? : Total 6 Click Score: 7    End of Session Equipment Utilized During Treatment:  Oxygen Activity Tolerance:  (limited 2/2 wanting to eat lunch) Patient left: in bed;with call bell/phone within reach;with bed alarm set (in chair position) Nurse Communication:  (pt needs set up for all meals) PT Visit Diagnosis: Muscle weakness (generalized) (M62.81);Other abnormalities of gait and mobility (R26.89)    Time: 8667-8655 PT Time Calculation (min) (ACUTE ONLY): 12 min   Charges:   PT Evaluation $PT Eval Moderate Complexity: 1 Mod   PT General Charges $$ ACUTE PT VISIT: 1 Visit         Richerd Pinal, PT, DPT 10/03/24, 2:04 PM   Richerd CHRISTELLA Pinal 10/03/2024, 2:01 PM

## 2024-10-03 NOTE — Progress Notes (Signed)
   10/03/24 2038  Notify: Charge Nurse/RN  Name of Charge Nurse/RN Notified Elenor RN  Provider Notification  Provider Name/Title Dr. Cleatus  Date Provider Notified 10/03/24  Time Provider Notified 2038  Method of Notification Page  Notification Reason  (Elevated B/P 129/127)  Provider response  (Recheck B/P in 1 hour)  Date of Provider Response 10/03/24  Time of Provider Response 2045

## 2024-10-03 NOTE — TOC Initial Note (Signed)
 Transition of Care [facility]) - Initial/Assessment Note    Patient Details  Name: [place] MRN: [id number] Date of Birth: Jan 14, 1941  [facility] CM/SW Contact:    George CHRISTELLA Ring, RN Phone Number: 10/03/2024, 10:34 AM  Clinical Narrative:                 Patient is a participant of [place] [facility], his provider is Dr. Eleanore.  He attends the day center off [address] 5 days per week M-F.  He lives with his wife, his son helps care for him and stays at the home at night.  He receives home care am and pm.  Reach out to [place] [facility] for discharge planning needs.  [place] [facility] provides all his transportation.  [phone].   Expected Discharge Plan: Home/Self Care Barriers to Discharge: Continued Medical Work up   Patient Goals and CMS Choice            Expected Discharge Plan and Services   Discharge Planning Services: CM Consult   Living arrangements for the past 2 months: Single Family Home                 DME Arranged: [place] [facility]/A         HH Arranged: NA          Prior Living Arrangements/Services Living arrangements for the past 2 months: Single Family Home Lives with:: Spouse, Adult Children Patient language and need for interpreter reviewed:: Yes Do you feel safe going back to the place where you live?: Yes      Need for Family Participation in Patient Care: Yes (Comment) Care giver support system in place?: Yes (comment) Current home services: DME, Homehealth aide (Wheelchair, [place] bed, sit to stand lift) Criminal Activity/Legal Involvement Pertinent to Current Situation/Hospitalization: No - Comment as needed  Activities of Daily Living      Permission Sought/Granted Permission sought to share information with : Facility Medical Sales Representative    Share Information with NAME: [place] Bolton  Permission granted to share info w AGENCY: [place] [facility]  Permission granted to share info w Relationship: wife  Permission granted to share info w Contact Information:  [phone]  Emotional Assessment Appearance:: Appears stated age       Alcohol / Substance Use: Not Applicable Psych Involvement: No (comment)  Admission diagnosis:  Acute hypoxic respiratory failure (HCC) [J96.01] Patient Active Problem List   Diagnosis Date Noted   Acute hypoxic respiratory failure (HCC) 10/02/2024   Acute CHF (congestive heart failure) (HCC) 05/15/2024   COVID 06/12/2023   Coronary artery disease 04/26/2023   Essential hypertension 04/26/2023   BPH (benign prostatic hyperplasia) 04/26/2023   Depression 04/26/2023   Acute on chronic systolic CHF (congestive heart failure) (HCC) 04/26/2023   Acute on chronic diastolic CHF (congestive heart failure) (HCC) 04/25/2023   Shortness of breath 09/18/2022   Acute on chronic diastolic (congestive) heart failure (HCC) 07/16/2022   Atrial fibrillation, chronic (HCC) 07/16/2022   Hyperlipidemia associated with type 2 diabetes mellitus (HCC) 07/16/2022   CKD (chronic kidney disease), stage IV (HCC) 07/16/2022   Left leg swelling    NSVT (nonsustained ventricular tachycardia) (HCC) 12/10/2021   Type 2 diabetes mellitus with diabetic nephropathy (HCC) 12/10/2021   Elevated troponin 12/10/2021   History of stroke 12/10/2021   Osteoarthritis of knee 12/10/2021   Acute kidney injury superimposed on chronic kidney disease 11/22/2021   Lymphedema 03/15/2018   Gout 01/25/2017   PCP:  [place] [facility]:   [place] [phone] -  [place]), [place] - [address] [place] [address] [place] ([place]) [place] Phone: [phone] Fax: [phone]  [place] [address], [place] - [address] [place] [address] [place] [address] Phone: [phone] Fax: [phone]  [place], [place] - [address] [place] Phone: [phone] Fax: [phone]     Social Drivers of Health (SDOH) Social History: SDOH  Screenings   Food Insecurity: No Food Insecurity (05/15/2024)  Housing: Low Risk  (05/15/2024)  Transportation Needs: No Transportation Needs (05/15/2024)  Utilities: Not At Risk (05/15/2024)  Social Connections: Moderately Isolated (05/15/2024)  Tobacco Use: Medium Risk (10/02/2024)   SDOH Interventions:     Readmission Risk Interventions    09/20/2022   10:22 AM  Readmission Risk Prevention Plan  Post Dischage Appt Complete  Medication Screening Complete  Transportation Screening Complete

## 2024-10-03 NOTE — Progress Notes (Addendum)
 PROGRESS NOTE    [place] Bolton  FMW:[id number] DOB: 23-Nov-1940 DOA: 10/02/2024 PCP: [facility]   Assessment & Plan:   Principal Problem:   Acute hypoxic respiratory failure (HCC) Active Problems:   Atrial fibrillation, chronic (HCC)   Type 2 diabetes mellitus with diabetic nephropathy (HCC)   Essential hypertension   Elevated troponin   CKD (chronic kidney disease), stage IV (HCC)   BPH (benign prostatic hyperplasia)  Assessment and Plan: Acute hypoxic respiratory failure: likely secondary to CHF exacerbation. BNP is elevated. Continue on IV lasix . Monitor I/Os.   Chronic a.fib: continue on coreg , eliquis .   HTN: continue on coreg , clonidine .   DM2: likely poorly controlled. Continue on SSI w/ accucheck    CKDIV: Cr is at baseline. Avoid neprotoxic meds   BPH: continue on home dose of finasteride   HLD: continue on statin    Gout: continue on decreased dose of allopurinol  secondary to CKDIV       DVT prophylaxis: eliquis  Code Status: DNR Family Communication: called pt's son no answer so I left a voicemail Disposition Plan: depends on PT/OT recs  Level of care: Progressive Consultants:    Procedures:   Antimicrobials:   Subjective: Pt c/o being hungry   Objective: Vitals:   10/03/24 0623 10/03/24 0700 10/03/24 0756 10/03/24 1000  BP:  (!) 157/107  (!) 170/112  Pulse: 63 (!) 104  (!) 101  Resp: 20 11  12   Temp:   (!) 96.2 F (35.7 C)   TempSrc:   Axillary   SpO2: 95% 95%  100%  Weight:      Height:        Intake/Output Summary (Last 24 hours) at 10/03/2024 1054 Last data filed at 10/03/2024 0530 Gross per 24 hour  Intake --  Output 1850 ml  Net -1850 ml   Filed Weights   10/02/24 1158  Weight: 77.1 kg    Examination:  General exam: Appears calm and comfortable  Respiratory system: course breath sounds b/l  Cardiovascular system: S1 & S2+. No rubs, gallops or clicks.  Gastrointestinal system: Abdomen is  nondistended, soft and nontender.. Normal bowel sounds heard. Central nervous system: Alert and awake. Moves all extremities Psychiatry: Judgement and insight appears at baseline. Flat mood and affect   Data Reviewed: I have personally reviewed following labs and imaging studies  CBC: Recent Labs  Lab 10/02/24 1159 10/03/24 0538  WBC 7.4 6.5  HGB 14.3 14.4  HCT 45.2 45.0  MCV 95.4 95.1  PLT 122* 133*   Basic Metabolic Panel: Recent Labs  Lab 10/02/24 1159 10/02/24 1413 10/03/24 0538  NA 141  --  143  K 4.9  --  4.0  CL 107  --  109  CO2 23  --  19*  GLUCOSE 113*  --  117*  BUN 40*  --  40*  CREATININE 2.21*  --  2.02*  CALCIUM  10.0  --  9.9  MG  --  2.6*  --    GFR: Estimated Creatinine Clearance: 29.2 mL/min (A) (by C-G formula based on SCr of 2.02 mg/dL (H)). Liver Function Tests: No results for input(s): AST, ALT, ALKPHOS, BILITOT, PROT, ALBUMIN in the last 168 hours. No results for input(s): LIPASE, AMYLASE in the last 168 hours. No results for input(s): AMMONIA in the last 168 hours. Coagulation Profile: No results for input(s): INR, PROTIME in the last 168 hours. Cardiac Enzymes: No results for input(s): CKTOTAL, CKMB, CKMBINDEX, TROPONINI in the last 168 hours. BNP (last  3 results) Recent Labs    10/02/24 1159  PROBNP 13,650.0*   HbA1C: No results for input(s): HGBA1C in the last 72 hours. CBG: Recent Labs  Lab 10/02/24 1952 10/02/24 2316 10/03/24 0726  GLUCAP 95 164* 113*   Lipid Profile: No results for input(s): CHOL, HDL, LDLCALC, TRIG, CHOLHDL, LDLDIRECT in the last 72 hours. Thyroid Function Tests: No results for input(s): TSH, T4TOTAL, FREET4, T3FREE, THYROIDAB in the last 72 hours. Anemia Panel: No results for input(s): VITAMINB12, FOLATE, FERRITIN, TIBC, IRON, RETICCTPCT in the last 72 hours. Sepsis Labs: Recent Labs  Lab 10/02/24 1749  LATICACIDVEN 1.8    No  results found for this or any previous visit (from the past 240 hours).       Radiology Studies: DG Chest 2 View Result Date: 10/02/2024 EXAM: 2 VIEW(S) XRAY OF THE CHEST 10/02/2024 12:29:25 PM COMPARISON: 05/15/2024 CLINICAL HISTORY: Chest pain. FINDINGS: LUNGS AND PLEURA: Low lung volumes accentuate cardiomediastinal silhouette and pulmonary vascularity. Bibasilar airspace opacities, left greater than right. Small bilateral pleural effusions. No pneumothorax. HEART AND MEDIASTINUM: No acute abnormality of the cardiac and mediastinal silhouettes. BONES AND SOFT TISSUES: Upper chest obscured by the patient head and facial tissues due to kyphosis and limited mobility. Thoracic spondylosis. No acute osseous abnormality. IMPRESSION: 1. Bibasilar airspace opacities, left greater than right. 2. Small bilateral pleural effusions. 3. Low lung volumes with accentuation of the cardiomediastinal silhouette and pulmonary vascularity; upper chest partially obscured by patient head and facial tissues due to kyphosis and limited mobility. 4. Thoracic spondylosis. Electronically signed by: Ryan Salvage MD 10/02/2024 01:01 PM EST RP Workstation: [id number]        Scheduled Meds:  allopurinol   50 mg Oral Daily   apixaban   2.5 mg Oral BID   carvedilol   12.5 mg Oral BID   cloNIDine   0.2 mg Transdermal Weekly   empagliflozin   10 mg Oral Daily   finasteride   5 mg Oral Daily   furosemide   40 mg Intravenous BID   insulin  aspart  0-5 Units Subcutaneous QHS   insulin  aspart  0-6 Units Subcutaneous TID WC   rosuvastatin   20 mg Oral Daily   sodium chloride  flush  3 mL Intravenous Q12H   Continuous Infusions:   LOS: 0 days     Anthony CHRISTELLA Pouch, MD [facility] Pager [phone]  If 7PM-7AM, please contact night-coverage [email or web address] 10/03/2024, 10:54 AM

## 2024-10-03 NOTE — Progress Notes (Signed)
   Brief Progress Note   _____________________________________________________________________________________________________________  Patient Name: [place] Bolton Patient DOB: 14-Jan-1941 Date: @TODAY @      Data: Reviewed labs, vital signs, and notes.    Action: No action required at this time.    Response:    _____________________________________________________________________________________________________________  [facility] RN Expeditor Draedyn Weidinger S Dillard Pascal Please contact us  directly via secure chat (search for [facility]) or by calling us  at [phone] [facility]).

## 2024-10-03 NOTE — Plan of Care (Signed)
  Problem: Education: Goal: Ability to describe self-care measures that may prevent or decrease complications (Diabetes Survival Skills Education) will improve Outcome: Progressing   Problem: Coping: Goal: Ability to adjust to condition or change in health will improve Outcome: Progressing

## 2024-10-03 NOTE — ED Notes (Signed)
 This EDT got a temp on this pt and repositioned pt. Pt is resting and has no other needs at this time.

## 2024-10-03 NOTE — Progress Notes (Signed)
 OT Cancellation Note  Patient Details Name: [place] Bolton MRN: [id number] DOB: 06-26-1941   Cancelled Treatment:    Reason Eval/Treat Not Completed: Medical issues which prohibited therapy. Pt with elevated BP (171/100 MAP 117), OT will hold evaluation at this time and see when appropriate.   Scott Vanderveer L. Brynnlie Unterreiner, OTR/L  10/03/24, 9:01 AM

## 2024-10-03 NOTE — Progress Notes (Signed)
   10/03/24 2155  Vitals  Temp 97.8 F (36.6 C)  Temp Source Axillary  BP (!) 149/109  MAP (mmHg) 122  BP Location Right Arm  BP Method Automatic  Patient Position (if appropriate) Sitting  Pulse Rate (!) 109  Pulse Rate Source Monitor  Resp 19  Level of Consciousness  Level of Consciousness Alert  MEWS COLOR  MEWS Score Color Green  Oxygen Therapy  O2 Device Nasal Cannula  O2 Flow Rate (L/min) 2 L/min  MEWS Score  MEWS Temp 0  MEWS Systolic 0  MEWS Pulse 1  MEWS RR 0  MEWS LOC 0  MEWS Score 1  Provider Notification  Provider Name/Title Dr. Cleatus  Date Provider Notified 10/03/24  Time Provider Notified 2157  Method of Notification Page  Notification Reason Other (Comment) (B/P)  Provider response No new orders  Date of Provider Response 10/03/24  Time of Provider Response 2215

## 2024-10-03 NOTE — Progress Notes (Signed)
 Heart Failure Navigator Progress Note  Assessed for Heart & Vascular TOC clinic readiness.  Patient does not meet criteria due to current [facility] patient.   Navigator will sign off at this time.  Charmaine Pines, RN, BSN [facility] Heart Failure Navigator Secure Chat Only

## 2024-10-04 LAB — BASIC METABOLIC PANEL WITH GFR
Anion gap: 14 (ref 5–15)
BUN: 41 mg/dL — ABNORMAL HIGH (ref 8–23)
CO2: 22 mmol/L (ref 22–32)
Calcium: 9.5 mg/dL (ref 8.9–10.3)
Chloride: 108 mmol/L (ref 98–111)
Creatinine, Ser: 2.2 mg/dL — ABNORMAL HIGH (ref 0.61–1.24)
GFR, Estimated: 29 mL/min — ABNORMAL LOW (ref >60)
Glucose, Bld: 89 mg/dL (ref 70–99)
Potassium: 4.1 mmol/L (ref 3.5–5.1)
Sodium: 144 mmol/L (ref 135–145)

## 2024-10-04 LAB — GLUCOSE, CAPILLARY
Glucose-Capillary: 98 mg/dL (ref 70–99)
Glucose-Capillary: 206 mg/dL — ABNORMAL HIGH (ref 70–99)
Glucose-Capillary: 142 mg/dL — ABNORMAL HIGH (ref 70–99)
Glucose-Capillary: 100 mg/dL — ABNORMAL HIGH (ref 70–99)

## 2024-10-04 LAB — CBC
HCT: 45.2 % (ref 39.0–52.0)
Hemoglobin: 14.3 g/dL (ref 13.0–17.0)
MCH: 29.9 pg (ref 26.0–34.0)
MCHC: 31.6 g/dL (ref 30.0–36.0)
MCV: 94.6 fL (ref 80.0–100.0)
Platelets: 133 K/uL — ABNORMAL LOW (ref 150–400)
RBC: 4.78 MIL/uL (ref 4.22–5.81)
RDW: 17.2 % — ABNORMAL HIGH (ref 11.5–15.5)
WBC: 6.8 K/uL (ref 4.0–10.5)
nRBC: 0 % (ref 0.0–0.2)

## 2024-10-04 LAB — URINE CULTURE

## 2024-10-04 MED ORDER — FUROSEMIDE 10 MG/ML IJ SOLN
40.0000 mg | Freq: Every day | INTRAMUSCULAR | Status: DC
  Administered 2024-10-05: 40 mg via INTRAVENOUS
  Filled 2024-10-04: qty 4

## 2024-10-04 NOTE — Plan of Care (Signed)
  Problem: Education: Goal: Ability to describe self-care measures that may prevent or decrease complications (Diabetes Survival Skills Education) will improve Outcome: Progressing   Problem: Fluid Volume: Goal: Ability to maintain a balanced intake and output will improve Outcome: Progressing   Problem: Metabolic: Goal: Ability to maintain appropriate glucose levels will improve Outcome: Progressing   Problem: Tissue Perfusion: Goal: Adequacy of tissue perfusion will improve Outcome: Progressing   

## 2024-10-04 NOTE — Progress Notes (Addendum)
 PROGRESS NOTE   HPI was taken from Dr. Fernand: George Bolton is a 83 y.o. year old male with medical history of hypertension, hyperlipidemia, type 2 diabetes, atrial fibrillation on Eliquis , HFrEF (EF 45% in 04/2024) presented to the ED with shortness of breath, chest pain and some confusion.     Pt unable to provide any history as he is very somnolent on my evaluation. Unfortunately he is status post ativan  in the ED given agitation. I tried to call spouse on file but no answer. I was able to speak with son, who state spt uses wheel chair for mobility. [facility]. He is intermittently using 2.5 L of oxygen but has required it more. He denies any other complaints but states it appeared his fathers breathing had worsened over the last day. He reports his father weight is steady around 164-165lbs. He was recently told to increase his torsemide  to 20 mg daily which he did.    [facility] it appears pt is here after having shortness of breath and more confusion along with significantly elevated blood pressures.      On arrival to the ED patient was noted to be HDS stable.  Lab work and imaging obtained.  CBC without leukocytosis, baseline hemoglobin which is normal.  Stable thrombocytopenia.  BMP with baseline renal function consistent with CKD 4, proBNP significantly elevated at 13.6 K, initial troponin elevated at 92 with repeat pending.  Chest x-ray shows pulmonary edema with low lung volumes with concern for bilateral pleural effusions and opacities versus atelectasis.  Given this presentation, [facility] contacted for admission.    JOSEMIGUEL GRIES  FMW:[id number] DOB: May 08, 1941 DOA: 10/02/2024 PCP: [facility]   Assessment & Plan:   Principal Problem:   Acute hypoxic respiratory failure (HCC) Active Problems:   Atrial fibrillation, chronic (HCC)   Type 2 diabetes mellitus with diabetic nephropathy (HCC)   Essential hypertension    Elevated troponin   CKD (chronic kidney disease), stage IV (HCC)   BPH (benign prostatic hyperplasia)  Assessment and Plan: Acute hypoxic respiratory failure: likely secondary to CHF exacerbation. BNP is elevated. Continue on IV lasix . Monitor I/Os. Continue on supplemental oxygen and wean as tolerated.   Vascular dementia: continue w/ supportive care    Chronic a.fib: continue on eliquis , coreg    HTN: continue on clonidine , coreg     DM2: likely poorly controlled. Continue on SSI w/ accuchecks    CKDIV: Cr is trending up from day prior. Decreasing IV lasix .    BPH: continue on home dose of finasteride   HLD: continue on statin    Gout: continue on decreased dose of allopurinol  secondary to CKDIV       DVT prophylaxis: eliquis  Code Status: DNR Family Communication: called pt's son, Deandre, and answered his questions  Disposition Plan: likely d/c back home w/ HH   Level of care: Progressive Consultants:    Procedures:   Antimicrobials:   Subjective: Pt says he is dead and you all are killing me.  Objective: Vitals:   10/04/24 0500 10/04/24 0502 10/04/24 0806 10/04/24 1211  BP: (!) 166/123  (!) 136/102 124/82  Pulse: 99 99 95 83  Resp: 17  16 16   Temp: 97.9 F (36.6 C)  97.9 F (36.6 C) 97.6 F (36.4 C)  TempSrc:      SpO2: 98% 98% 97% 96%  Weight:      Height:        Intake/Output Summary (  Last 24 hours) at 10/04/2024 1237 Last data filed at 10/04/2024 0032 Gross per 24 hour  Intake --  Output 1650 ml  Net -1650 ml   Filed Weights   10/02/24 1158 10/04/24 0500  Weight: 77.1 kg 73.2 kg    Examination:  General exam: appears confused Respiratory system: course breath sounds b/l  Cardiovascular system: S1/S2+. No rubs or gallops  Gastrointestinal system: Abd is soft, NT, ND & hypoactive bowel sounds Central nervous system: alert & awake. Moves all extremities Psychiatry: Judgement and insight appears poor. Agitated mood and affect   Data  Reviewed: I have personally reviewed following labs and imaging studies  CBC: Recent Labs  Lab 10/02/24 1159 10/03/24 0538 10/04/24 0503  WBC 7.4 6.5 6.8  HGB 14.3 14.4 14.3  HCT 45.2 45.0 45.2  MCV 95.4 95.1 94.6  PLT 122* 133* 133*   Basic Metabolic Panel: Recent Labs  Lab 10/02/24 1159 10/02/24 1413 10/03/24 0538 10/04/24 0503  NA 141  --  143 144  K 4.9  --  4.0 4.1  CL 107  --  109 108  CO2 23  --  19* 22  GLUCOSE 113*  --  117* 89  BUN 40*  --  40* 41*  CREATININE 2.21*  --  2.02* 2.20*  CALCIUM  10.0  --  9.9 9.5  MG  --  2.6*  --   --    GFR: Estimated Creatinine Clearance: 25.4 mL/min (A) (by C-G formula based on SCr of 2.2 mg/dL (H)). Liver Function Tests: No results for input(s): AST, ALT, ALKPHOS, BILITOT, PROT, ALBUMIN in the last 168 hours. No results for input(s): LIPASE, AMYLASE in the last 168 hours. No results for input(s): AMMONIA in the last 168 hours. Coagulation Profile: No results for input(s): INR, PROTIME in the last 168 hours. Cardiac Enzymes: No results for input(s): CKTOTAL, CKMB, CKMBINDEX, TROPONINI in the last 168 hours. BNP (last 3 results) Recent Labs    10/02/24 1159  PROBNP 13,650.0*   HbA1C: No results for input(s): HGBA1C in the last 72 hours. CBG: Recent Labs  Lab 10/03/24 0726 10/03/24 1754 10/03/24 2005 10/04/24 0807 10/04/24 1211  GLUCAP 113* 125* 125* 98 206*   Lipid Profile: No results for input(s): CHOL, HDL, LDLCALC, TRIG, CHOLHDL, LDLDIRECT in the last 72 hours. Thyroid Function Tests: No results for input(s): TSH, T4TOTAL, FREET4, T3FREE, THYROIDAB in the last 72 hours. Anemia Panel: No results for input(s): VITAMINB12, FOLATE, FERRITIN, TIBC, IRON, RETICCTPCT in the last 72 hours. Sepsis Labs: Recent Labs  Lab 10/02/24 1749  LATICACIDVEN 1.8    Recent Results (from the past 240 hours)  Urine Culture     Status: Abnormal   Collection  Time: 10/03/24 10:07 AM   Specimen: Urine, Clean Catch  Result Value Ref Range Status   Specimen Description   Final    URINE, CLEAN CATCH Performed at [facility], [address] [place], [place] [address]    Special Requests   Final    NONE Reflexed from [id number] Performed at [facility], [address] [place], [place] [address]    Culture MULTIPLE SPECIES PRESENT, SUGGEST RECOLLECTION (A)  Final   Report Status 10/04/2024 FINAL  Final         Radiology Studies: No results found.       Scheduled Meds:  allopurinol   50 mg Oral Daily   apixaban   2.5 mg Oral BID   carvedilol   12.5 mg Oral BID   cloNIDine   0.2 mg Transdermal Weekly   finasteride   5 mg Oral Daily   insulin  aspart  0-5 Units Subcutaneous QHS   insulin  aspart  0-6 Units Subcutaneous TID WC   rosuvastatin   20 mg Oral Daily   sodium chloride  flush  3 mL Intravenous Q12H   Continuous Infusions:   LOS: 1 day     Anthony CHRISTELLA Pouch, MD [facility] Pager [phone]  If 7PM-7AM, please contact night-coverage [email or web address] 10/04/2024, 12:37 PM

## 2024-10-04 NOTE — Evaluation (Signed)
 Occupational Therapy Evaluation Patient Details Name: George Bolton MRN: [id number] DOB: 1941/08/22 Today's Date: 10/04/2024   History of Present Illness   Pt is an 83 y/o M admitted on 10/02/24 after presenting with c/o SOB, chest pain, confusion. Pt is being treated for acute hypoxic respiratory failure likely 2/2 CHF exacerbation. PMH: HTN, HLD, DM2, a-fib on Eliquis , HFrEF, stroke, gout     Clinical Impressions Patient presenting with decreased Ind in self care,balance, functional mobility, transfers, endurance, and safety awareness. Patient reports living at home with family and having assistance and an aide to assist with self care needs. Pt reports use of sit to stand lift for functional transfers but does not confirm if it is electrical lift or not. During this assessment pt endorses attempting to have BM in bed upon entering the room. Max A to roll pt and place onto bed pan. He was  unable to void and removed from bed pan per his request. Pt set up for oral care which took pt ~ 10 minutes and he still had not terminated task. Pt appeared to be falling asleep but would get upset each time therapist attempted to terminate oral care tasks. Pt loudly vocalizing he wants to go home. He is likely close to baseline and endorses [facility]. Patient will benefit from acute OT to increase overall independence in the areas of ADLs, functional mobility, and safety awareness in order to safely discharge.        Functional Status Assessment   Patient has had a recent decline in their functional status and/or demonstrates limited ability to make significant improvements in function in a reasonable and predictable amount of time     Equipment Recommendations   None recommended by OT      Precautions/Restrictions   Precautions Precautions: Fall     Mobility Bed Mobility Overal bed mobility: Needs Assistance Bed Mobility: Supine to Sit, Sit to Supine,  Rolling Rolling: Used rails, Max assist              Transfers                          Balance                                           ADL either performed or assessed with clinical judgement   ADL Overall ADL's : Needs assistance/impaired     Grooming: Oral care;Wash/dry face;Set up;Bed level                                 General ADL Comments: urgency for BM and needing to be placed on bed pan with max A to roll in bed     Vision Patient Visual Report: No change from baseline       Perception         Praxis         Pertinent Vitals/Pain Pain Assessment Pain Assessment: No/denies pain     Extremity/Trunk Assessment Upper Extremity Assessment Upper Extremity Assessment: Generalized weakness   Lower Extremity Assessment Lower Extremity Assessment: Generalized weakness   Cervical / Trunk Assessment Cervical / Trunk Assessment: Kyphotic   Communication Communication Communication: Impaired Factors Affecting Communication: Reduced clarity of speech   Cognition Arousal: Lethargic, Alert  Behavior During Therapy: Flat affect Cognition: No family/caregiver present to determine baseline, Cognition impaired   Orientation impairments: Time, Situation     Attention impairment (select first level of impairment): Focused attention Executive functioning impairment (select all impairments): Organization, Sequencing, Reasoning, Problem solving                   Following commands: Impaired Following commands impaired: Follows one step commands with increased time     Cueing  General Comments   Cueing Techniques: Verbal cues;Gestural cues      Exercises     Shoulder Instructions      Home Living Family/patient expects to be discharged to:: Private residence Living Arrangements: Spouse/significant other;Children Available Help at Discharge: Family Type of Home: House Home Access: Ramped  entrance     Home Layout: One level     Bathroom Shower/Tub: Chief Strategy Officer: Standard Bathroom Accessibility: No   Home Equipment: Wheelchair - manual;Lift chair;BSC/3in1;Hospital bed;Other (comment)   Additional Comments: Personal care aide 2 hours in AM & 2 hours in PM (bathing, dressing, toileting, washing up) 5 days/week.      Prior Functioning/Environment Prior Level of Function : Needs assist             Mobility Comments: uses sit>stand lift for transfers, does not propel his own w/c ADLs Comments: assistance for bathing, dressing, self care, meal set up    OT Problem List: Decreased strength;Impaired balance (sitting and/or standing);Decreased safety awareness;Decreased activity tolerance   OT Treatment/Interventions: Self-care/ADL training;Therapeutic exercise;Patient/family education;Balance training;Energy conservation;Therapeutic activities      OT Goals(Current goals can be found in the care plan section)   Acute Rehab OT Goals Patient Stated Goal: to go home OT Goal Formulation: With patient Time For Goal Achievement: 10/18/24 Potential to Achieve Goals: Fair ADL Goals Pt Will Perform Grooming: sitting;with supervision Pt Will Transfer to Toilet: with max assist Pt Will Perform Toileting - Clothing Manipulation and hygiene: with max assist   OT Frequency:  Min 2X/week    Co-evaluation              AM-PAC OT 6 Clicks Daily Activity     Outcome Measure Help from another person eating meals?: None Help from another person taking care of personal grooming?: A Little Help from another person toileting, which includes using toliet, bedpan, or urinal?: A Lot Help from another person bathing (including washing, rinsing, drying)?: Total Help from another person to put on and taking off regular upper body clothing?: A Lot Help from another person to put on and taking off regular lower body clothing?: Total 6 Click Score: 13    End of Session Nurse Communication: Mobility status  Activity Tolerance: Patient limited by fatigue Patient left: in bed;with call bell/phone within reach;with bed alarm set  OT Visit Diagnosis: Unsteadiness on feet (R26.81);Repeated falls (R29.6);Muscle weakness (generalized) (M62.81)                Time: 8891-8864 OT Time Calculation (min): 27 min Charges:  OT General Charges $OT Visit: 1 Visit OT Evaluation $OT Eval Moderate Complexity: 1 Mod OT Treatments $Self Care/Home Management : 8-22 mins  Izetta Claude, MS, OTR/L , CBIS ascom [phone]  10/04/24, 4:29 PM

## 2024-10-04 NOTE — Evaluation (Signed)
 Clinical/Bedside Swallow Evaluation Patient Details  Name: George Bolton MRN: [id number] Date of Birth: [birth year]  Today's Date: 10/04/2024 Time: SLP Start Time (ACUTE ONLY): 0915 SLP Stop Time (ACUTE ONLY): 1010 SLP Time Calculation (min) (ACUTE ONLY): 55 min  Past Medical History:  Past Medical History:  Diagnosis Date   Acute embolic stroke (HCC) 11/26/2021   Chronic atrial fibrillation (HCC)    a. Afib dates back to at least 2002 when reviewing prior EKG with EKGs from 2014 onward showing Afib; b. CHADS2VASc at least 4 (CHF, HTN, age x 2)   Chronic diastolic CHF (congestive heart failure) (HCC)    a. TTE 5/19: EF 70-75%, mod LVH, near complete obliteration of the mid and apical LV cavity (can be seen in apical hypertrophic CM), mildly dilated LA, RV cavity size nl, RV wall thickness nl, RVSF mildly reduced, mildly dilated RA; b. 02/2018 Limited echo w/ definity : EF 50-65%, no apical hypertrophic CM.   Community acquired bilateral lower lobe pneumonia 11/23/2021   Goals of care, counseling/discussion 12/11/2021   Gout    Hypertension    Hypertensive emergency 12/10/2021   Hypertensive urgency 07/16/2022   Past Surgical History:  Past Surgical History:  Procedure Laterality Date   CHOLECYSTECTOMY N/A 01/13/2017   Procedure: LAPAROSCOPIC CHOLECYSTECTOMY;  Surgeon: Laneta JULIANNA Luna, MD;  Location: [facility] ORS;  Service: General;  Laterality: N/A;   HPI:  83 year old male admitted 10/02/24 secondary to acute hypoxic respiratory failure likely secondary to CHF exacerbation. CXR noted bibasilar airspace opacities left greater than right, bilateral pleural effusions. PMH significant for HTN, HLD, DM type II, A-fib on Eliquis , HFrEF (45%), prior stroke, and gout.   Son served as swallowing historian. He endorsed encouraging patient to consume softer solids to maximize efficiency as generally takes 30-45 minutes to consume a meal. He has been resistant to fully comply with modified diet despite  rationale to maximize efficiency expressing he eats what he wants and is a selective eater overall. Despite this, he denied unintentional weight loss. Reports use of oral nutritional supplement at least once daily (strawberry preferred). Denied recurrent pulmonary compromise.   Assessment / Plan / Recommendation  Clinical Impression  Mr. Kipp presents with a suspected mild oral dysphagia which negatively impacts overall swallow efficiency. Based on review of EMR and family report, this is chronic in nature. Slow mastication of solids with further resistance to sigificant modifications when encouraged to consider to optimize efficiency. Suspect diminished A-P transit and lingual stripping leading to at least diffuse mild oral residue. No overt clinical indicators concerning for aspiration; however, risk is increased due to supplemental assistance for feeding, cognitive status and history of prior stroke. Efficiency concerns also present prior to hospitalization per son report with attempting use of oral nutritional supplements daily. Discussed benefits/burdens for diet modifications in relation to patient engagement, efficiency and safety. SLP to follow to ensure tolerance with least restrictive diet. SLP Visit Diagnosis: Dysphagia, oral phase (R13.11)    Aspiration Risk  Mild aspiration risk;Risk for inadequate nutrition/hydration    Diet Recommendation Dysphagia 3 (Mech soft);Other (Comment) (extra sauces/gravies; well cooked meats and vegetables)    Liquid Administration via: Straw Medication Administration: Crushed with puree Supervision: Staff to assist with self feeding Compensations: Minimize environmental distractions;Small sips/bites;Follow solids with liquid Postural Changes: Seated upright at 90 degrees    Other Recommendations Oral Care Recommendations: Oral care before and after PO     Swallow Evaluation Recommendations  Anticipate no SLP needs for dysphagia at discharge  Assistance Recommended at Discharge  Anticipate no SLP needs for dysphagia at discharge  Functional Status Assessment Patient has had a recent decline in their functional status and demonstrates the ability to make significant improvements in function in a reasonable and predictable amount of time.  Frequency and Duration min 3x week  1 week       Prognosis Prognosis for improved oropharyngeal function: Fair Barriers to Reach Goals: Cognitive deficits;Motivation      Swallow Study   General Date of Onset: 10/04/24 HPI: 83 year old male admitted 10/02/24 secondary to acute hypoxic respiratory failure likely secondary to CHF exacerbation. CXR noted bibasilar airspace opacities left greater than right, bilateral pleural effusions. PMH significant for HTN, HLD, DM type II, A-fib on Eliquis , HFrEF (45%), prior stroke, and gout. Type of Study: Bedside Swallow Evaluation Previous Swallow Assessment: CSE 05/18/24: Recommended regular solids with chopped meats and extra sauces/gravies and thin liquids. No overt s/s concerning for aspiration; however, efficiency concerns noted due to prolonged mastication. Diet Prior to this Study: Dysphagia 3 (mechanical soft) Respiratory Status: Room air History of Recent Intubation: No Behavior/Cognition: Agitated Oral Care Completed by SLP: No Oral Cavity - Dentition: Poor condition;Missing dentition;Other (Comment) (son stated had molars removed in the spring d/t poor condition. not considering partials/dentures.) Vision: Functional for self-feeding Self-Feeding Abilities: Needs set up;Needs assist;Other (Comment) (decreased PO acceptance d/t dislike of choices despite encouragement and presentation of additional options) Patient Positioning: Upright in bed Baseline Vocal Quality: Low vocal intensity;Hoarse Volitional Cough: Cognitively unable to elicit Volitional Swallow: Able to elicit    Oral/Motor/Sensory Function Overall Oral Motor/Sensory Function:  Mild impairment Facial ROM: Within Functional Limits Facial Symmetry: Within Functional Limits Facial Strength: Within Functional Limits;Other (Comment) (generalized weakness with diminished labial seal) Lingual ROM: Within Functional Limits Lingual Symmetry: Within Functional Limits Lingual Strength: Within Functional Limits;Other (Comment) (generalized weakness)   Ice Chips Ice chips: Not tested   Thin Liquid Thin Liquid: Within functional limits Presentation: Straw Other Comments: Patient refused trialing without straw. Spontaneous consecutive sips. No overt s/s concerning for aspiration.    Nectar Thick Nectar Thick Liquid: Not tested   Honey Thick Honey Thick Liquid: Not tested   Puree Puree: Within functional limits Presentation: Spoon   Solid     Solid: Impaired Oral Phase Impairments: Reduced lingual movement/coordination;Impaired mastication Oral Phase Functional Implications: Prolonged oral transit;Impaired mastication;Oral residue     Rosaline HERO. Krystal, MA, CCC-SLP Speech Language Pathologist [facility] Acute Rehab  Rosaline HERO Krystal 10/04/2024,10:43 AM

## 2024-10-05 LAB — CBC
HCT: 42.9 % (ref 39.0–52.0)
Hemoglobin: 13.4 g/dL (ref 13.0–17.0)
MCH: 30.1 pg (ref 26.0–34.0)
MCHC: 31.2 g/dL (ref 30.0–36.0)
MCV: 96.4 fL (ref 80.0–100.0)
Platelets: 126 K/uL — ABNORMAL LOW (ref 150–400)
RBC: 4.45 MIL/uL (ref 4.22–5.81)
RDW: 16.9 % — ABNORMAL HIGH (ref 11.5–15.5)
WBC: 8 K/uL (ref 4.0–10.5)
nRBC: 0 % (ref 0.0–0.2)

## 2024-10-05 LAB — GLUCOSE, CAPILLARY
Glucose-Capillary: 95 mg/dL (ref 70–99)
Glucose-Capillary: 296 mg/dL — ABNORMAL HIGH (ref 70–99)
Glucose-Capillary: 98 mg/dL (ref 70–99)
Glucose-Capillary: 117 mg/dL — ABNORMAL HIGH (ref 70–99)

## 2024-10-05 LAB — BASIC METABOLIC PANEL WITH GFR
Anion gap: 12 (ref 5–15)
BUN: 47 mg/dL — ABNORMAL HIGH (ref 8–23)
CO2: 23 mmol/L (ref 22–32)
Calcium: 9.4 mg/dL (ref 8.9–10.3)
Chloride: 108 mmol/L (ref 98–111)
Creatinine, Ser: 2.38 mg/dL — ABNORMAL HIGH (ref 0.61–1.24)
GFR, Estimated: 26 mL/min — ABNORMAL LOW (ref >60)
Glucose, Bld: 110 mg/dL — ABNORMAL HIGH (ref 70–99)
Potassium: 4.5 mmol/L (ref 3.5–5.1)
Sodium: 143 mmol/L (ref 135–145)

## 2024-10-05 MED ORDER — TORSEMIDE 20 MG PO TABS
20.0000 mg | ORAL_TABLET | Freq: Every day | ORAL | Status: DC
  Administered 2024-10-06: 20 mg via ORAL
  Filled 2024-10-05: qty 1

## 2024-10-05 NOTE — Plan of Care (Signed)
°  Problem: Clinical Measurements: Goal: Respiratory complications will improve Outcome: Progressing   Problem: Clinical Measurements: Goal: Cardiovascular complication will be avoided Outcome: Progressing   Problem: Elimination: Goal: Will not experience complications related to urinary retention Outcome: Progressing   Problem: Pain Managment: Goal: General experience of comfort will improve and/or be controlled Outcome: Progressing   Problem: Safety: Goal: Ability to remain free from injury will improve Outcome: Progressing   Problem: Clinical Measurements: Goal: Will remain free from infection Outcome: Progressing

## 2024-10-05 NOTE — TOC Progression Note (Signed)
 Transition of Care [facility]) - Progression Note    Patient Details  Name: George Bolton MRN: [id number] Date of Birth: 1940/12/06  [name] CM/SW Contact  Victory Jackquline RAMAN, RN Phone Number: 10/05/2024, 3:42 PM  Clinical Narrative:    3:00 pm: RNCM received a message from the MD via secure chat asking me if [facility] could pick up the patient today.  3:25 pm: RNCM called [facility] and spoke to Barnie, introduced role and explained that discharge planning would be discussed. Informed her that the MD was discharging the patient today and waned to set up transportation for the patient. She said that she had to check with her administrator and call me back. Awaiting call back.  3:35 pm: RNCM received a call back from Barnie and she informed me that they would not be able to take him today secondary to it being late in the afternoon and all of their drivers were out and the pharmacy wouldn't be able to get his meds in time. She also said that the patient's [facility] provider Maeola Quaker, MD @ [phone] Ext.. [phone] wanted to speak to the MD. MD made aware. [facility] will pick patient up on 10/06/2024 @ between 9:30 am and 9:45 am. RNCM will continue to follow for discharge planning needs.    Expected Discharge Plan: Home/Self Care Barriers to Discharge: Continued Medical Work up               Expected Discharge Plan and Services   Discharge Planning Services: CM Consult   Living arrangements for the past 2 months: Single Family Home                 DME Arranged: N/A         HH Arranged: NA           Social Drivers of Health (SDOH) Interventions SDOH Screenings   Food Insecurity: Patient Unable To Answer (10/03/2024)  Housing: Unknown (10/03/2024)  Transportation Needs: Patient Unable To Answer (10/03/2024)  Utilities: Patient Unable To Answer (10/03/2024)  Social Connections: Patient Unable To Answer (10/03/2024)  Tobacco Use: Medium Risk (10/02/2024)    Readmission Risk  Interventions    09/20/2022   10:22 AM  Readmission Risk Prevention Plan  Post Dischage Appt Complete  Medication Screening Complete  Transportation Screening Complete

## 2024-10-05 NOTE — Plan of Care (Signed)
  Problem: Clinical Measurements: Goal: Respiratory complications will improve Outcome: Progressing   Problem: Clinical Measurements: Goal: Cardiovascular complication will be avoided Outcome: Progressing   Problem: Coping: Goal: Level of anxiety will decrease Outcome: Progressing   Problem: Elimination: Goal: Will not experience complications related to urinary retention Outcome: Progressing   Problem: Pain Managment: Goal: General experience of comfort will improve and/or be controlled Outcome: Progressing   Problem: Safety: Goal: Ability to remain free from injury will improve Outcome: Progressing

## 2024-10-05 NOTE — Progress Notes (Signed)
 PROGRESS NOTE   HPI was taken from Dr. Fernand: George Bolton is a 83 y.o. year old male with medical history of hypertension, hyperlipidemia, type 2 diabetes, atrial fibrillation on Eliquis , HFrEF (EF 45% in 04/2024) presented to the ED with shortness of breath, chest pain and some confusion.     Pt unable to provide any history as he is very somnolent on my evaluation. Unfortunately he is status post ativan  in the ED given agitation. I tried to call spouse on file but no answer. I was able to speak with son, who state spt uses wheel chair for mobility. [facility] via [facility]. He is intermittently using 2.5 L of oxygen but has required it more. He denies any other complaints but states it appeared his fathers breathing had worsened over the last day. He reports his father weight is steady around 164-165lbs. He was recently told to increase his torsemide  to 20 mg daily which he did.    Reviewing notes from [facility] it appears pt is here after having shortness of breath and more confusion along with significantly elevated blood pressures.      On arrival to the ED patient was noted to be HDS stable.  Lab work and imaging obtained.  CBC without leukocytosis, baseline hemoglobin which is normal.  Stable thrombocytopenia.  BMP with baseline renal function consistent with CKD 4, proBNP significantly elevated at 13.6 K, initial troponin elevated at 92 with repeat pending.  Chest x-ray shows pulmonary edema with low lung volumes with concern for bilateral pleural effusions and opacities versus atelectasis.  Given this presentation, [facility] contacted for admission.    George Bolton  FMW:[id number] DOB: 1941/01/01 DOA: 10/02/2024 PCP: [facility]   Assessment & Plan:   Principal Problem:   Acute hypoxic respiratory failure (HCC) Active Problems:   Atrial fibrillation, chronic (HCC)   Type 2 diabetes mellitus with diabetic nephropathy (HCC)   Essential hypertension    Elevated troponin   CKD (chronic kidney disease), stage IV (HCC)   BPH (benign prostatic hyperplasia)  Assessment and Plan: Acute hypoxic respiratory failure: likely secondary to CHF exacerbation. BNP is elevated.  Respiratory symptoms resolved. Cr up-trend today, appears euvolemic, will resume home torsemide  tomorrow  Vascular dementia: continue w/ supportive care    Chronic a.fib: continue on eliquis , coreg    HTN: continue on clonidine , coreg     DM2: reasonable control here. Continue on SSI w/ accuchecks    CKDIV: slight up-trend as above, stop iv lasix    BPH: continue on home dose of finasteride   HLD: continue on statin    Gout: continue on decreased dose of allopurinol  secondary to CKDIV       DVT prophylaxis: eliquis  Code Status: DNR Family Communication: called pt's son, Deandre, and answered his questions  Disposition Plan: home w/ [facility] tomorrow (no transport today)  Level of care: Progressive Consultants:    Procedures:   Antimicrobials:   Subjective: Reports feeling run down, no specific complaints, says breathing is at baseline  Objective: Vitals:   10/04/24 2344 10/05/24 0426 10/05/24 0747 10/05/24 1207  BP: (!) 139/106 (!) 153/116 (!) 161/104 119/71  Pulse: 95 80 80 98  Resp: 16 16 16 16   Temp: 98.8 F (37.1 C) 98.7 F (37.1 C) 98.6 F (37 C) 97.8 F (36.6 C)  TempSrc:   Axillary   SpO2: 95% 95% 100% 97%  Weight:  73.2 kg    Height:        Intake/Output  Summary (Last 24 hours) at 10/05/2024 1643 Last data filed at 10/05/2024 0700 Gross per 24 hour  Intake 358 ml  Output 700 ml  Net -342 ml   Filed Weights   10/02/24 1158 10/04/24 0500 10/05/24 0426  Weight: 77.1 kg 73.2 kg 73.2 kg    Examination:  General exam: appears confused Respiratory system: rales at bases otherwise clear Cardiovascular system: S1/S2+. No rubs or gallops. No LE edema Gastrointestinal system: Abd is soft, NT, ND   Central nervous system: alert & awake.  Moves all extremities Psychiatry: Judgement and insight appears poor. Agitated mood and affect   Data Reviewed: I have personally reviewed following labs and imaging studies  CBC: Recent Labs  Lab 10/02/24 1159 10/03/24 0538 10/04/24 0503 10/05/24 0441  WBC 7.4 6.5 6.8 8.0  HGB 14.3 14.4 14.3 13.4  HCT 45.2 45.0 45.2 42.9  MCV 95.4 95.1 94.6 96.4  PLT 122* 133* 133* 126*   Basic Metabolic Panel: Recent Labs  Lab 10/02/24 1159 10/02/24 1413 10/03/24 0538 10/04/24 0503 10/05/24 0441  NA 141  --  143 144 143  K 4.9  --  4.0 4.1 4.5  CL 107  --  109 108 108  CO2 23  --  19* 22 23  GLUCOSE 113*  --  117* 89 110*  BUN 40*  --  40* 41* 47*  CREATININE 2.21*  --  2.02* 2.20* 2.38*  CALCIUM  10.0  --  9.9 9.5 9.4  MG  --  2.6*  --   --   --    GFR: Estimated Creatinine Clearance: 23.5 mL/min (A) (by C-G formula based on SCr of 2.38 mg/dL (H)). Liver Function Tests: No results for input(s): AST, ALT, ALKPHOS, BILITOT, PROT, ALBUMIN in the last 168 hours. No results for input(s): LIPASE, AMYLASE in the last 168 hours. No results for input(s): AMMONIA in the last 168 hours. Coagulation Profile: No results for input(s): INR, PROTIME in the last 168 hours. Cardiac Enzymes: No results for input(s): CKTOTAL, CKMB, CKMBINDEX, TROPONINI in the last 168 hours. BNP (last 3 results) Recent Labs    10/02/24 1159  PROBNP 13,650.0*   HbA1C: No results for input(s): HGBA1C in the last 72 hours. CBG: Recent Labs  Lab 10/04/24 1211 10/04/24 1619 10/04/24 2129 10/05/24 0750 10/05/24 1208  GLUCAP 206* 142* 100* 95 296*   Lipid Profile: No results for input(s): CHOL, HDL, LDLCALC, TRIG, CHOLHDL, LDLDIRECT in the last 72 hours. Thyroid Function Tests: No results for input(s): TSH, T4TOTAL, FREET4, T3FREE, THYROIDAB in the last 72 hours. Anemia Panel: No results for input(s): VITAMINB12, FOLATE, FERRITIN, TIBC, IRON,  RETICCTPCT in the last 72 hours. Sepsis Labs: Recent Labs  Lab 10/02/24 1749  LATICACIDVEN 1.8    Recent Results (from the past 240 hours)  Urine Culture     Status: Abnormal   Collection Time: 10/03/24 10:07 AM   Specimen: Urine, Clean Catch  Result Value Ref Range Status   Specimen Description   Final    URINE, CLEAN CATCH Performed at [facility], [address], [place], [place] [address]    Special Requests   Final    NONE Reflexed from [id number] Performed at [facility], [address] [place], [place] [address]    Culture MULTIPLE SPECIES PRESENT, SUGGEST RECOLLECTION (A)  Final   Report Status 10/04/2024 FINAL  Final         Radiology Studies: No results found.       Scheduled Meds:  allopurinol   50 mg Oral Daily   apixaban   2.5 mg Oral BID   carvedilol   12.5 mg Oral BID   cloNIDine   0.2 mg Transdermal Weekly   finasteride   5 mg Oral Daily   furosemide   40 mg Intravenous Daily   insulin  aspart  0-5 Units Subcutaneous QHS   insulin  aspart  0-6 Units Subcutaneous TID WC   rosuvastatin   20 mg Oral Daily   sodium chloride  flush  3 mL Intravenous Q12H   Continuous Infusions:   LOS: 2 days     Devaughn KATHEE Ban, MD [facility]  If 7PM-7AM, please contact night-coverage [email or web address] 10/05/2024, 4:43 PM

## 2024-10-06 DIAGNOSIS — J9601 Acute respiratory failure with hypoxia: Secondary | ICD-10-CM | POA: Diagnosis not present

## 2024-10-06 LAB — GLUCOSE, CAPILLARY: Glucose-Capillary: 130 mg/dL — ABNORMAL HIGH (ref 70–99)

## 2024-10-06 MED ORDER — ENSURE PLUS HIGH PROTEIN PO LIQD
237.0000 mL | Freq: Two times a day (BID) | ORAL | Status: DC
  Administered 2024-10-06: 237 mL via ORAL

## 2024-10-06 NOTE — Discharge Instructions (Signed)
Nutrition Post Hospital Stay °Proper nutrition can help your body recover from illness and injury.   °Foods and beverages high in protein, vitamins, and minerals help rebuild muscle loss, promote healing, & reduce fall risk.  ° °•In addition to eating healthy foods, a nutrition shake is an easy, delicious way to get the nutrition you need during and after your hospital stay ° °It is recommended that you continue to drink 2 bottles per day of:       Ensure Plus for at least 1 month (30 days) after your hospital stay  ° °Tips for adding a nutrition shake into your routine: °As allowed, drink one with vitamins or medications instead of water or juice °Enjoy one as a tasty mid-morning or afternoon snack °Drink cold or make a milkshake out of it °Drink one instead of milk with cereal or snacks °Use as a coffee creamer °  °Available at the following grocery stores and pharmacies:           °* [employer] * [employer] * [employer]  °* [employer]          * Walmart * [employer]  °* Wal[facility]reens      * Target  * BJ's   °* [facility]  * [employer]   °* [facility] [phone]  °          °For COUPONS visit: www.ensure.com/join or www.[email or web address]/members/sign-up  ° °Suggested Substitutions °Ensure Plus = Boost Plus = Carnation Breakfast Essentials = Boost Compact °Ensure Active Clear = Boost Breeze °Glucerna Shake = Boost Glucose Control = Carnation Breakfast Essentials SUGAR FREE ° °  °

## 2024-10-06 NOTE — Discharge Summary (Addendum)
 George Bolton FMW:[id number] DOB: 01/07/1941 DOA: 10/02/2024  PCP: [facility]  Admit date: 10/02/2024 Discharge date: 10/06/2024  Time spent: 35 minutes  Recommendations for Outpatient Follow-up:  Pcp ([facility]) f/u today     Discharge Diagnoses:  Principal Problem:   Acute hypoxic respiratory failure (HCC) Active Problems:   Atrial fibrillation, chronic (HCC)   Type 2 diabetes mellitus with diabetic nephropathy (HCC)   Essential hypertension   Elevated troponin   CKD (chronic kidney disease), stage IV (HCC)   BPH (benign prostatic hyperplasia)   Discharge Condition: improved  Diet recommendation: heart healthy, dysphagia 3  Filed Weights   10/04/24 0500 10/05/24 0426 10/06/24 0500  Weight: 73.2 kg 73.2 kg 73.5 kg    History of present illness:  From admission h and p George Bolton is a 83 y.o. year old male with medical history of hypertension, hyperlipidemia, type 2 diabetes, atrial fibrillation on Eliquis , HFrEF (EF 45% in 04/2024) presented to the ED with shortness of breath, chest pain and some confusion.     Pt unable to provide any history as he is very somnolent on my evaluation. Unfortunately he is status post ativan  in the ED given agitation. I tried to call spouse on file but no answer. I was able to speak with son, who state spt uses wheel chair for mobility. He [facility] to [facility] every day via transportation. He is intermittently using 2.5 L of oxygen but has required it more. He denies any other complaints but states it appeared his fathers breathing had worsened over the last day. He reports his father weight is steady around 164-165lbs. He was recently told to increase his torsemide  to 20 mg daily which he did.    Reviewing notes from [facility] it appears pt is here after having shortness of breath and more confusion along with significantly elevated blood pressures.      On arrival to the ED patient was noted to be HDS stable.  Lab work and imaging  obtained.  CBC without leukocytosis, baseline hemoglobin which is normal.  Stable thrombocytopenia.  BMP with baseline renal function consistent with CKD 4, proBNP significantly elevated at 13.6 K, initial troponin elevated at 92 with repeat pending.  Chest x-ray shows pulmonary edema with low lung volumes with concern for bilateral pleural effusions and opacities versus atelectasis.  Given this presentation, [facility] contacted for admission.  Hospital Course:   Patient presents with chest pain, confusion, and dyspnea. Found to have mild CHF exacerbation, treated with IV lasix . Is on as needed oxygen at home and has been weaned to 1-2 liters here. Mild trop elevation likely 2/2 demand, ACS ruled out. Confusion has resolved. Dyspnea also resolved, patient reports he is feeling well. Other chronic medical problems stable. Discharge plan reviewed with family and patient's [facility] physician. Of note, our med rec appears to be erroneous, but explained to family and [facility] we advise continuing all prior to admission meds, no changes. May, however, need ongoing titration of his diuretic.   Procedures: none   Consultations: none  Discharge Exam: Vitals:   10/06/24 0356 10/06/24 0755  BP: (!) 149/104 (!) 131/93  Pulse: (!) 108 93  Resp: 20 19  Temp: 98.3 F (36.8 C) 98.4 F (36.9 C)  SpO2: 95% 96%    General: NAD Cardiovascular: RRR Respiratory: rales at bases otherwise clear Ext: warm, no edema  Discharge Instructions   Discharge Instructions     Increase activity slowly   Complete by: As directed  No wound care   Complete by: As directed       Allergies as of 10/06/2024       Reactions   Cortisone Anaphylaxis   Triamcinolone    Other reaction(s): Unknown        Medication List     STOP taking these medications    furosemide  40 MG tablet Commonly known as: LASIX        TAKE these medications    acetaminophen  650 MG CR tablet Commonly known as: TYLENOL  Take 1,300 mg by  mouth 2 (two) times daily.   allopurinol  100 MG tablet Commonly known as: ZYLOPRIM  Take 1 tablet (100 mg total) by mouth daily. Home med.   apixaban  2.5 MG Tabs tablet Commonly known as: ELIQUIS  Take 2.5 mg by mouth 2 (two) times daily.   bisacodyl  5 MG EC tablet Commonly known as: DULCOLAX Take 2 tablets (10 mg total) by mouth at bedtime. Skip the dose if no constipation   carvedilol  12.5 MG tablet Commonly known as: COREG  Take 12.5 mg by mouth 2 (two) times daily.   cloNIDine  0.2 mg/24hr patch Commonly known as: CATAPRES  - Dosed in mg/24 hr Place 0.2 mg onto the skin once a week. What changed: Another medication with the same name was removed. Continue taking this medication, and follow the directions you see here.   fexofenadine 180 MG tablet Commonly known as: ALLEGRA Take 180 mg by mouth daily.   finasteride  5 MG tablet Commonly known as: PROSCAR  Take 1 tablet (5 mg total) by mouth daily.   hydrALAZINE  50 MG tablet Commonly known as: APRESOLINE  Take 50 mg by mouth 3 (three) times daily.   isosorbide  mononitrate 30 MG 24 hr tablet Commonly known as: IMDUR  Take 1 tablet (30 mg total) by mouth daily. Hold if SBP <130 mmHg   Jardiance  10 MG Tabs tablet Generic drug: empagliflozin  Take 10 mg by mouth daily.   Lokelma 5 g packet Generic drug: sodium zirconium cyclosilicate Take 1 packet by mouth daily.   losartan  50 MG tablet Commonly known as: COZAAR  Take 1 tablet (50 mg total) by mouth daily.   mirtazapine  7.5 MG tablet Commonly known as: REMERON  Take 7.5 mg by mouth at bedtime.   nystatin  powder Commonly known as: MYCOSTATIN /NYSTOP  Apply 1 Application topically 2 (two) times daily.   polyethylene glycol 17 g packet Commonly known as: MIRALAX  / GLYCOLAX  Take 17 g by mouth 2 (two) times daily. Skip the dose if no constipation   rosuvastatin  20 MG tablet Commonly known as: Crestor  Take 1 tablet (20 mg total) by mouth daily.   senna 8.6 MG Tabs  tablet Commonly known as: SENOKOT Take 2 tablets by mouth daily.   tamsulosin  0.4 MG Caps capsule Commonly known as: FLOMAX  Take 1 capsule (0.4 mg total) by mouth daily after supper.   torsemide  20 MG tablet Commonly known as: DEMADEX  Take 1 tablet (20 mg total) by mouth daily.       Allergies[1]  Follow-up Information     [facility] Follow up.                   The results of significant diagnostics from this hospitalization (including imaging, microbiology, ancillary and laboratory) are listed below for reference.    Significant Diagnostic Studies: DG Chest 2 View Result Date: 10/02/2024 EXAM: 2 VIEW(S) XRAY OF THE CHEST 10/02/2024 12:29:25 PM COMPARISON: 05/15/2024 CLINICAL HISTORY: Chest pain. FINDINGS: LUNGS AND PLEURA: Low lung volumes accentuate cardiomediastinal silhouette and pulmonary vascularity.  Bibasilar airspace opacities, left greater than right. Small bilateral pleural effusions. No pneumothorax. HEART AND MEDIASTINUM: No acute abnormality of the cardiac and mediastinal silhouettes. BONES AND SOFT TISSUES: Upper chest obscured by the patient head and facial tissues due to kyphosis and limited mobility. Thoracic spondylosis. No acute osseous abnormality. IMPRESSION: 1. Bibasilar airspace opacities, left greater than right. 2. Small bilateral pleural effusions. 3. Low lung volumes with accentuation of the cardiomediastinal silhouette and pulmonary vascularity; upper chest partially obscured by patient head and facial tissues due to kyphosis and limited mobility. 4. Thoracic spondylosis. Electronically signed by: Ryan Salvage MD 10/02/2024 01:01 PM EST RP Workstation: [id number]   US  SCROTUM Result Date: 09/13/2024 CLINICAL DATA:  Hydrocele EXAM: SCROTAL ULTRASOUND DOPPLER ULTRASOUND OF THE TESTICLES TECHNIQUE: Complete ultrasound examination of the testicles, epididymis, and other scrotal structures was performed. Color and spectral Doppler  ultrasound were also utilized to evaluate blood flow to the testicles. COMPARISON:  None Available. FINDINGS: Right testicle Measurements: 2.8 x 1.8 x 2.7 cm. No mass or microlithiasis visualized. Left testicle Measurements: 3.6 x 1.4 x 3.8 cm. No mass or microlithiasis visualized. Right epididymis: A cluster of multiple epididymal cysts are identified. The largest cyst measures 2.4 cm. Left epididymis: Multiple epididymal cysts are present. The largest cyst measures 1 cm. Hydrocele: Trace right hydrocele. Large left complex fluid collection likely a hydrocele with internal debris, measuring approximately 13.4 x 7.8 x 8.7 cm. Varicocele: Right sided varicocele. Unable to evaluate left side due to large hydrocele. Pulsed Doppler interrogation of both testes demonstrates normal low resistance arterial and venous waveforms bilaterally. IMPRESSION: Bilateral epididymal cysts. Large complex left hydrocele. Differentials include a very large epididymal cyst mimicking hydrocele. Right-sided varicocele. Electronically Signed   By: Megan  Zare M.D.   On: 09/13/2024 12:03    Microbiology: Recent Results (from the past 240 hours)  Urine Culture     Status: Abnormal   Collection Time: 10/03/24 10:07 AM   Specimen: Urine, Clean Catch  Result Value Ref Range Status   Specimen Description   Final    URINE, CLEAN CATCH Performed at [facility], [address] [place], [place] [address]    Special Requests   Final    NONE Reflexed from [id number] Performed at [facility], [address] [place], [place] [address]    Culture MULTIPLE SPECIES PRESENT, SUGGEST RECOLLECTION (A)  Final   Report Status 10/04/2024 FINAL  Final     Labs: Basic Metabolic Panel: Recent Labs  Lab 10/02/24 1159 10/02/24 1413 10/03/24 0538 10/04/24 0503 10/05/24 0441  NA 141  --  143 144 143  K 4.9  --  4.0 4.1 4.5  CL 107  --  109 108 108  CO2 23  --  19* 22 23  GLUCOSE 113*  --  117* 89 110*  BUN 40*  --  40*  41* 47*  CREATININE 2.21*  --  2.02* 2.20* 2.38*  CALCIUM  10.0  --  9.9 9.5 9.4  MG  --  2.6*  --   --   --    Liver Function Tests: No results for input(s): AST, ALT, ALKPHOS, BILITOT, PROT, ALBUMIN in the last 168 hours. No results for input(s): LIPASE, AMYLASE in the last 168 hours. No results for input(s): AMMONIA in the last 168 hours. CBC: Recent Labs  Lab 10/02/24 1159 10/03/24 0538 10/04/24 0503 10/05/24 0441  WBC 7.4 6.5 6.8 8.0  HGB 14.3 14.4 14.3 13.4  HCT 45.2 45.0 45.2 42.9  MCV 95.4 95.1 94.6 96.4  PLT 122* 133* 133* 126*   Cardiac Enzymes: No results for input(s): CKTOTAL, CKMB, CKMBINDEX, TROPONINI in the last 168 hours. BNP: BNP (last 3 results) Recent Labs    05/15/24 0030  BNP 856.3*    ProBNP (last 3 results) Recent Labs    10/02/24 1159  PROBNP 13,650.0*    CBG: Recent Labs  Lab 10/05/24 0750 10/05/24 1208 10/05/24 1658 10/05/24 2135 10/06/24 0753  GLUCAP 95 296* 98 117* 130*       Signed:  Devaughn KATHEE Ban MD.  [facility] 10/06/2024, 8:12 AM     [1]  Allergies Allergen Reactions   Cortisone Anaphylaxis   Triamcinolone     Other reaction(s): Unknown

## 2024-10-06 NOTE — Progress Notes (Signed)
 Speech Language Pathology Treatment: Dysphagia  Patient Details Name: TERIN CRAGLE MRN: [id number] DOB: [birth year] Today's Date: 10/06/2024 Time: 9144-9064 SLP Time Calculation (min) (ACUTE ONLY): 40 min  Assessment / Plan / Recommendation Clinical Impression  Pt seen for ongoing assessment of swallowing and toleration of oral diet. He is currently on the Dysphagia level 3 (mech soft for ease of chewing d/t Missing Dentition) and Thin liquids. Pt was awake and stated I can feed myself; he needed full support w/ sitting upright for oral intake and w/ tray setup.  Pt appears to present w/ grossly functional oropharyngeal phase swallowing w/ No overt oropharyngeal phase dysphagia noted. Pt consumed po trials w/ No immediate, overt clinical s/s of aspiration during po trials -- however, mild throat clear/cough noted post drinking Multiple sips/ozs at one time(Impuslive?).  Pt appears at reduced risk for aspiration when following general aspiration precautions w/ an easy to chew/eat diet consistency d/t Missing Dentition. Pt has challenging factors that could impact oropharyngeal swallowing to include deconditioning/weakness, need for setup and support at meals, min Impulsivity when drinking liquids, and advanced age. These factors can increase risk for dysphagia as well as decreased oral intake overall.   During po trials, pt consumed consistencies w/ no immediate, overt coughing, decline in vocal quality, or change in respiratory presentation during/post trials. Oral phase appeared grossly [facility] w/ timely bolus management, mastication, and control of bolus propulsion for A-P transfer for swallowing. Min Impulsive drinking -- pt slowed down w/ verbal cues from this SLP. Education given on aspiration precautions and ways to reduce aspiration risk. Oral clearing achieved w/ all trial consistencies -- moistened, soft foods given.  Pt fed self w/ setup support.   Recommend continue a more Mech Soft  consistency diet w/ well-Cut meats, moistened foods d/t Missing Dentition and ease of mastication; Thin liquids -- carefully monitor straw use, and pt should Hold Cup when drinking. Recommend general aspiration precautions including Drinking Slowly w/ Smaller sips, reduce distractions/talking during oral intake. Tray setup and support at meals. Supervision at meals for follow through w/ aspiration precautions. Pills in Puree for safer, easier swallowing- encouraged now and for D/C to the NSG.  Education given on Pills in Puree; food consistencies and easy to eat options; general aspiration precautions to pt and NSG. MD updated and will reconsult if any new needs arise. Recommend Dietician f/u for support. Precautions posted in chart, room.       HPI HPI: 83 year old male admitted 10/02/24 secondary to acute hypoxic respiratory failure likely secondary to CHF exacerbation. CXR noted bibasilar airspace opacities left greater than right, bilateral pleural effusions. PMH significant for HTN, HLD, DM type II, A-fib on Eliquis , HFrEF (45%), prior stroke, and gout.      SLP Plan  All goals met (can have f/u at his program)        Swallow Evaluation Recommendations   Recommendations: PO diet PO Diet Recommendation: Dysphagia 3 (Mechanical soft);Thin liquids (Level 0) (added purees/minced foods for ease of oral phase chewing) Liquid Administration via: Cup;Straw (monitor for impulsive drinking) Medication Administration: Whole meds with puree (vs Crushed) Supervision: Patient able to self-feed;Intermittent supervision/cueing for swallowing strategies (setup assistance) Postural changes: Position pt fully upright for meals;Stay upright 30-60 min after meals;Out of bed for meals Oral care recommendations: Oral care BID (2x/day);Staff/trained caregiver to provide oral care Recommended consults: Consider dietitian consultation;Consider Palliative care     Recommendations  Oral care BID;Oral care before and after PO;Staff/trained caregiver to provide oral care   Intermittent Supervision/Assistance (>Frequent Supervision as need indicates) Dysphagia, oral phase (R13.11) (min impact of Impulsivity when drinking - Cognitive decline?; needs tray setup and sitting up support for oral intake)     All goals met (can have f/u at his program)      Comer Portugal, MS, CCC-SLP Speech Language Pathologist Rehab Services; [facility] [phone] (ascom) Muneeb Veras  10/06/2024, 2:30 PM

## 2024-11-30 NOTE — Progress Notes (Unsigned)
"   12/01/2024 12:22 PM   George Bolton Sep 08, 1941 [id number]  Urological history 1. BPH with LU TS - PSA(2020) 5.5 - Finasteride  5 mg daily  2. Renal cysts -RUS (10/2021) - multiple cysts in the right kidney largest measuring 5.4 x 5 x 5.6 cm in the upper pole - multiple cysts of varying sizes largest measuring 5.7 x 4.9 x 5.1 cm  3. Hydroceles - Scrotal US  (08/2024) large complex left hydrocele vs large epididymal cyst  4.  Nephrolithiasis -CT urogram (07/2019) - Multiple bilateral kidney stones without hydronephrosis.   5.  Bilateral epididymal cysts - Scrotal US  (08/2024)   No chief complaint on file.   HPI: George Bolton is a 84 y.o. male who presents today for yearly visit with his son, Deandre.  Previous records reviewed.      PVR ***  Serum creatinine (09/2024) 2.38, eGFR 26  Hemoglobin A1c (927974) 6.2  Diuretics: torsemide    BPH meds: finasteride  5 mg daily   PMH: Past Medical History:  Diagnosis Date   Acute embolic stroke (HCC) 11/26/2021   Chronic atrial fibrillation (HCC)    a. Afib dates back to at least 2002 when reviewing prior EKG with EKGs from 2014 onward showing Afib; b. CHADS2VASc at least 4 (CHF, HTN, age x 2)   Chronic diastolic CHF (congestive heart failure) (HCC)    a. TTE 5/19: EF 70-75%, mod LVH, near complete obliteration of the mid and apical LV cavity (can be seen in apical hypertrophic CM), mildly dilated LA, RV cavity size nl, RV wall thickness nl, RVSF mildly reduced, mildly dilated RA; b. 02/2018 Limited echo w/ definity : EF 50-65%, no apical hypertrophic CM.   Community acquired bilateral lower lobe pneumonia 11/23/2021   Goals of care, counseling/discussion 12/11/2021   Gout    Hypertension    Hypertensive emergency 12/10/2021   Hypertensive urgency 07/16/2022    Surgical History: Past Surgical History:  Procedure Laterality Date   CHOLECYSTECTOMY N/A 01/13/2017   Procedure: LAPAROSCOPIC CHOLECYSTECTOMY;  Surgeon:  Laneta JULIANNA Luna, MD;  Location: [facility] ORS;  Service: General;  Laterality: N/A;    Home Medications:  Allergies as of 12/01/2024       Reactions   Cortisone Anaphylaxis   Triamcinolone    Other reaction(s): Unknown        Medication List        Accurate as of November 30, 2024 12:22 PM. If you have any questions, ask your nurse or doctor.          acetaminophen  650 MG CR tablet Commonly known as: TYLENOL  Take 1,300 mg by mouth 2 (two) times daily.   allopurinol  100 MG tablet Commonly known as: ZYLOPRIM  Take 1 tablet (100 mg total) by mouth daily. Home med.   apixaban  2.5 MG Tabs tablet Commonly known as: ELIQUIS  Take 2.5 mg by mouth 2 (two) times daily.   bisacodyl  5 MG EC tablet Commonly known as: DULCOLAX Take 2 tablets (10 mg total) by mouth at bedtime. Skip the dose if no constipation   carvedilol  12.5 MG tablet Commonly known as: COREG  Take 12.5 mg by mouth 2 (two) times daily.   cloNIDine  0.2 mg/24hr patch Commonly known as: CATAPRES  - Dosed in mg/24 hr Place 0.2 mg onto the skin once a week.   fexofenadine 180 MG tablet Commonly known as: ALLEGRA Take 180 mg by mouth daily.   finasteride  5 MG tablet Commonly known as: PROSCAR  Take 1 tablet (5 mg total) by mouth daily.  hydrALAZINE  50 MG tablet Commonly known as: APRESOLINE  Take 50 mg by mouth 3 (three) times daily.   isosorbide  mononitrate 30 MG 24 hr tablet Commonly known as: IMDUR  Take 1 tablet (30 mg total) by mouth daily. Hold if SBP <130 mmHg   Jardiance  10 MG Tabs tablet Generic drug: empagliflozin  Take 10 mg by mouth daily.   Lokelma 5 g packet Generic drug: sodium zirconium cyclosilicate Take 1 packet by mouth daily.   losartan  50 MG tablet Commonly known as: COZAAR  Take 1 tablet (50 mg total) by mouth daily.   mirtazapine  7.5 MG tablet Commonly known as: REMERON  Take 7.5 mg by mouth at bedtime.   nystatin  powder Commonly known as: MYCOSTATIN /NYSTOP  Apply 1 Application  topically 2 (two) times daily.   polyethylene glycol 17 g packet Commonly known as: MIRALAX  / GLYCOLAX  Take 17 g by mouth 2 (two) times daily. Skip the dose if no constipation   rosuvastatin  20 MG tablet Commonly known as: Crestor  Take 1 tablet (20 mg total) by mouth daily.   senna 8.6 MG Tabs tablet Commonly known as: SENOKOT Take 2 tablets by mouth daily.   tamsulosin  0.4 MG Caps capsule Commonly known as: FLOMAX  Take 1 capsule (0.4 mg total) by mouth daily after supper.   torsemide  20 MG tablet Commonly known as: DEMADEX  Take 1 tablet (20 mg total) by mouth daily.        Allergies:  Allergies  Allergen Reactions   Cortisone Anaphylaxis   Triamcinolone     Other reaction(s): Unknown    Family History: Family History  Problem Relation Age of Onset   Stroke Mother     Social History:   reports that he has quit smoking. His smoking use included cigarettes. He has never used smokeless tobacco. He reports that he does not drink alcohol and does not use drugs.  Physical Exam: There were no vitals taken for this visit.  Constitutional:  Well nourished. Alert and oriented, No acute distress. HEENT: [place] AT, moist mucus membranes.  Trachea midline, no masses. Cardiovascular: No clubbing, cyanosis, or edema. Respiratory: Normal respiratory effort, no increased work of breathing. GI: Abdomen is soft, non tender, non distended, no abdominal masses. Liver and spleen not palpable.  No hernias appreciated.  Stool sample for occult testing is not indicated.   GU: No CVA tenderness.  No bladder fullness or masses.  Patient with circumcised/uncircumcised phallus. ***Foreskin easily retracted***  Urethral meatus is patent.  No penile discharge. No penile lesions or rashes. Scrotum without lesions, cysts, rashes and/or edema.  Testicles are located scrotally bilaterally. No masses are appreciated in the testicles. Left and right epididymis are normal. Rectal: Patient with  normal  sphincter tone. Anus and perineum without scarring or rashes. No rectal masses are appreciated. Prostate is approximately *** grams, *** nodules are appreciated. Seminal vesicles are normal. Skin: No rashes, bruises or suspicious lesions. Lymph: No cervical or inguinal adenopathy. Neurologic: Grossly intact, no focal deficits, moving all 4 extremities. Psychiatric: Normal mood and affect.   Laboratory Data: See Epic and HPI    I have reviewed the labs.   Pertinent imaging: ***    Assessment & Plan:    1. Left hydrocele - ***  2. BPH with LU TS -  PVR > ***  3. Renal cyst -Stable, recommend no further monitoring for these.  4. Kidney stones -Asymptomatic  No follow-ups on file.  CLOTILDA CORNWALL, PA-C  [facility] [address], [address] [place], [place] [address] [phone]  [phone] °"

## 2024-12-01 ENCOUNTER — Ambulatory Visit: Payer: Medicare (Managed Care) | Admitting: Urology

## 2024-12-01 VITALS — BP 150/93 | HR 79

## 2024-12-01 DIAGNOSIS — N401 Enlarged prostate with lower urinary tract symptoms: Secondary | ICD-10-CM

## 2024-12-01 LAB — BLADDER SCAN AMB NON-IMAGING

## 2025-12-03 ENCOUNTER — Ambulatory Visit: Admitting: Urology
